# Patient Record
Sex: Male | Born: 1950 | Race: White | Hispanic: No | Marital: Married | Smoking: Former smoker
Health system: Southern US, Community
[De-identification: ages and names within clinical notes are randomized; demographics above are authoritative.]

## PROBLEM LIST (undated history)

## (undated) DIAGNOSIS — I209 Angina pectoris, unspecified: Secondary | ICD-10-CM

## (undated) DIAGNOSIS — F1010 Alcohol abuse, uncomplicated: Secondary | ICD-10-CM

## (undated) DIAGNOSIS — I429 Cardiomyopathy, unspecified: Secondary | ICD-10-CM

## (undated) DIAGNOSIS — I701 Atherosclerosis of renal artery: Secondary | ICD-10-CM

## (undated) DIAGNOSIS — K5792 Diverticulitis of intestine, part unspecified, without perforation or abscess without bleeding: Secondary | ICD-10-CM

## (undated) DIAGNOSIS — I499 Cardiac arrhythmia, unspecified: Secondary | ICD-10-CM

## (undated) DIAGNOSIS — K219 Gastro-esophageal reflux disease without esophagitis: Secondary | ICD-10-CM

## (undated) DIAGNOSIS — I714 Abdominal aortic aneurysm, without rupture, unspecified: Secondary | ICD-10-CM

## (undated) DIAGNOSIS — I509 Heart failure, unspecified: Secondary | ICD-10-CM

## (undated) DIAGNOSIS — I2510 Atherosclerotic heart disease of native coronary artery without angina pectoris: Secondary | ICD-10-CM

## (undated) DIAGNOSIS — J449 Chronic obstructive pulmonary disease, unspecified: Secondary | ICD-10-CM

## (undated) DIAGNOSIS — I739 Peripheral vascular disease, unspecified: Secondary | ICD-10-CM

## (undated) DIAGNOSIS — R0600 Dyspnea, unspecified: Secondary | ICD-10-CM

## (undated) DIAGNOSIS — I10 Essential (primary) hypertension: Secondary | ICD-10-CM

## (undated) DIAGNOSIS — E785 Hyperlipidemia, unspecified: Secondary | ICD-10-CM

## (undated) DIAGNOSIS — I219 Acute myocardial infarction, unspecified: Secondary | ICD-10-CM

## (undated) HISTORY — DX: Diverticulitis of intestine, part unspecified, without perforation or abscess without bleeding: K57.92

## (undated) HISTORY — DX: Gastro-esophageal reflux disease without esophagitis: K21.9

## (undated) HISTORY — PX: CLOSED REDUCTION SHOULDER DISLOCATION: SUR242

## (undated) HISTORY — DX: Hyperlipidemia, unspecified: E78.5

## (undated) HISTORY — DX: Abdominal aortic aneurysm, without rupture, unspecified: I71.40

## (undated) HISTORY — DX: Essential (primary) hypertension: I10

## (undated) HISTORY — DX: Atherosclerotic heart disease of native coronary artery without angina pectoris: I25.10

## (undated) HISTORY — DX: Abdominal aortic aneurysm, without rupture: I71.4

## (undated) HISTORY — PX: CORONARY ARTERY BYPASS GRAFT: SHX141

## (undated) HISTORY — PX: CARDIAC CATHETERIZATION: SHX172

## (undated) HISTORY — DX: Atherosclerosis of renal artery: I70.1

## (undated) HISTORY — PX: RENAL ARTERY STENT: SHX2321

---

## 1995-04-27 HISTORY — PX: ANGIOPLASTY: SHX39

## 2000-09-27 ENCOUNTER — Encounter: Payer: Self-pay | Admitting: *Deleted

## 2000-09-27 ENCOUNTER — Inpatient Hospital Stay (HOSPITAL_COMMUNITY): Admission: EM | Admit: 2000-09-27 | Discharge: 2000-09-29 | Payer: Self-pay | Admitting: Emergency Medicine

## 2000-09-29 ENCOUNTER — Encounter: Payer: Self-pay | Admitting: Cardiology

## 2001-02-18 ENCOUNTER — Encounter: Payer: Self-pay | Admitting: Emergency Medicine

## 2001-02-18 ENCOUNTER — Emergency Department (HOSPITAL_COMMUNITY): Admission: EM | Admit: 2001-02-18 | Discharge: 2001-02-19 | Payer: Self-pay | Admitting: Emergency Medicine

## 2001-10-23 ENCOUNTER — Encounter: Payer: Self-pay | Admitting: *Deleted

## 2001-10-23 ENCOUNTER — Ambulatory Visit (HOSPITAL_COMMUNITY): Admission: RE | Admit: 2001-10-23 | Discharge: 2001-10-23 | Payer: Self-pay | Admitting: *Deleted

## 2002-06-21 ENCOUNTER — Encounter: Payer: Self-pay | Admitting: *Deleted

## 2002-06-21 ENCOUNTER — Inpatient Hospital Stay (HOSPITAL_COMMUNITY): Admission: EM | Admit: 2002-06-21 | Discharge: 2002-07-02 | Payer: Self-pay | Admitting: *Deleted

## 2002-06-21 ENCOUNTER — Encounter: Payer: Self-pay | Admitting: Cardiology

## 2002-06-22 ENCOUNTER — Encounter: Payer: Self-pay | Admitting: Cardiology

## 2002-06-23 ENCOUNTER — Encounter: Payer: Self-pay | Admitting: Pulmonary Disease

## 2002-06-24 ENCOUNTER — Encounter: Payer: Self-pay | Admitting: Cardiology

## 2002-06-25 ENCOUNTER — Encounter: Payer: Self-pay | Admitting: Cardiology

## 2002-06-27 ENCOUNTER — Encounter: Payer: Self-pay | Admitting: Cardiology

## 2002-06-30 ENCOUNTER — Encounter: Payer: Self-pay | Admitting: Cardiology

## 2003-08-15 ENCOUNTER — Inpatient Hospital Stay (HOSPITAL_COMMUNITY): Admission: EM | Admit: 2003-08-15 | Discharge: 2003-08-17 | Payer: Self-pay | Admitting: Emergency Medicine

## 2004-01-11 ENCOUNTER — Inpatient Hospital Stay (HOSPITAL_COMMUNITY): Admission: EM | Admit: 2004-01-11 | Discharge: 2004-01-13 | Payer: Self-pay | Admitting: Emergency Medicine

## 2004-05-18 ENCOUNTER — Ambulatory Visit: Payer: Self-pay | Admitting: Cardiology

## 2004-05-18 ENCOUNTER — Ambulatory Visit: Payer: Self-pay | Admitting: Internal Medicine

## 2004-05-18 ENCOUNTER — Inpatient Hospital Stay (HOSPITAL_COMMUNITY): Admission: EM | Admit: 2004-05-18 | Discharge: 2004-05-25 | Payer: Self-pay | Admitting: *Deleted

## 2004-05-18 DIAGNOSIS — K5730 Diverticulosis of large intestine without perforation or abscess without bleeding: Secondary | ICD-10-CM | POA: Insufficient documentation

## 2005-05-07 ENCOUNTER — Ambulatory Visit: Payer: Self-pay | Admitting: Internal Medicine

## 2005-08-16 ENCOUNTER — Ambulatory Visit: Payer: Self-pay | Admitting: Cardiology

## 2005-08-27 ENCOUNTER — Ambulatory Visit (HOSPITAL_COMMUNITY)
Admission: RE | Admit: 2005-08-27 | Discharge: 2005-08-27 | Payer: Self-pay | Admitting: Physical Medicine and Rehabilitation

## 2005-11-16 ENCOUNTER — Ambulatory Visit: Payer: Self-pay | Admitting: Family Medicine

## 2005-11-23 ENCOUNTER — Ambulatory Visit: Payer: Self-pay | Admitting: Internal Medicine

## 2006-03-10 ENCOUNTER — Encounter: Payer: Self-pay | Admitting: Internal Medicine

## 2006-03-10 ENCOUNTER — Ambulatory Visit: Payer: Self-pay | Admitting: Family Medicine

## 2006-03-10 ENCOUNTER — Emergency Department (HOSPITAL_COMMUNITY): Admission: EM | Admit: 2006-03-10 | Discharge: 2006-03-10 | Payer: Self-pay | Admitting: Emergency Medicine

## 2006-03-10 ENCOUNTER — Ambulatory Visit: Payer: Self-pay | Admitting: Internal Medicine

## 2007-12-13 ENCOUNTER — Ambulatory Visit: Payer: Self-pay | Admitting: Internal Medicine

## 2007-12-13 ENCOUNTER — Observation Stay (HOSPITAL_COMMUNITY): Admission: EM | Admit: 2007-12-13 | Discharge: 2007-12-15 | Payer: Self-pay | Admitting: Emergency Medicine

## 2007-12-14 ENCOUNTER — Encounter: Payer: Self-pay | Admitting: Internal Medicine

## 2007-12-15 ENCOUNTER — Encounter: Payer: Self-pay | Admitting: Internal Medicine

## 2007-12-19 ENCOUNTER — Telehealth: Payer: Self-pay | Admitting: Internal Medicine

## 2007-12-21 ENCOUNTER — Ambulatory Visit: Payer: Self-pay | Admitting: Internal Medicine

## 2007-12-21 DIAGNOSIS — E785 Hyperlipidemia, unspecified: Secondary | ICD-10-CM

## 2007-12-21 DIAGNOSIS — K219 Gastro-esophageal reflux disease without esophagitis: Secondary | ICD-10-CM

## 2007-12-21 DIAGNOSIS — I25119 Atherosclerotic heart disease of native coronary artery with unspecified angina pectoris: Secondary | ICD-10-CM

## 2007-12-21 DIAGNOSIS — R42 Dizziness and giddiness: Secondary | ICD-10-CM

## 2007-12-21 DIAGNOSIS — I10 Essential (primary) hypertension: Secondary | ICD-10-CM

## 2008-11-05 ENCOUNTER — Ambulatory Visit: Payer: Self-pay | Admitting: Family Medicine

## 2008-11-05 DIAGNOSIS — K5792 Diverticulitis of intestine, part unspecified, without perforation or abscess without bleeding: Secondary | ICD-10-CM

## 2008-11-05 DIAGNOSIS — R109 Unspecified abdominal pain: Secondary | ICD-10-CM

## 2008-11-06 ENCOUNTER — Telehealth (INDEPENDENT_AMBULATORY_CARE_PROVIDER_SITE_OTHER): Payer: Self-pay | Admitting: Internal Medicine

## 2008-11-07 ENCOUNTER — Telehealth (INDEPENDENT_AMBULATORY_CARE_PROVIDER_SITE_OTHER): Payer: Self-pay | Admitting: Internal Medicine

## 2008-11-08 ENCOUNTER — Telehealth (INDEPENDENT_AMBULATORY_CARE_PROVIDER_SITE_OTHER): Payer: Self-pay | Admitting: Internal Medicine

## 2009-01-02 ENCOUNTER — Telehealth: Payer: Self-pay | Admitting: Internal Medicine

## 2009-04-17 ENCOUNTER — Ambulatory Visit: Payer: Self-pay | Admitting: Family Medicine

## 2009-04-17 LAB — CONVERTED CEMR LAB
Blood in Urine, dipstick: NEGATIVE
Ketones, urine, test strip: NEGATIVE
Nitrite: NEGATIVE
Urobilinogen, UA: 0.2
WBC Urine, dipstick: NEGATIVE

## 2009-04-18 ENCOUNTER — Encounter: Payer: Self-pay | Admitting: Family Medicine

## 2009-05-18 ENCOUNTER — Emergency Department (HOSPITAL_COMMUNITY): Admission: EM | Admit: 2009-05-18 | Discharge: 2009-05-18 | Payer: Self-pay | Admitting: Emergency Medicine

## 2009-07-21 ENCOUNTER — Ambulatory Visit: Payer: Self-pay | Admitting: Internal Medicine

## 2009-07-21 DIAGNOSIS — M79609 Pain in unspecified limb: Secondary | ICD-10-CM

## 2009-07-23 LAB — CONVERTED CEMR LAB
Albumin: 3.9 g/dL (ref 3.5–5.2)
BUN: 9 mg/dL (ref 6–23)
Calcium: 9.5 mg/dL (ref 8.4–10.5)
Chloride: 102 meq/L (ref 96–112)
Potassium: 4.9 meq/L (ref 3.5–5.1)

## 2010-03-02 ENCOUNTER — Telehealth: Payer: Self-pay | Admitting: Internal Medicine

## 2010-03-09 ENCOUNTER — Telehealth: Payer: Self-pay | Admitting: Internal Medicine

## 2010-05-26 NOTE — Progress Notes (Signed)
Summary: Rx Simvastatin  Phone Note Refill Request Call back at [phone] Message from:  [facility] on March 02, 2010 11:27 AM  Refills Requested: Medication #1:  SIMVASTATIN 40 MG TABS 1 daily Faxed form is in your in box. No recent lab work to check cholesterol.  Initial call taken by: Sydell Axon LPN,  March 02, 2010 11:28 AM  Follow-up for Phone Call        Rx completed in Dr. Tiajuana Amass  Please call him to schedule a physical in the next few months Follow-up by: Cindee Salt MD,  March 02, 2010 1:43 PM  Additional Follow-up for Phone Call Additional follow up Details #1::        Called patient and left message on machine to sch appt. Additional Follow-up by: Mervin Hack CMA Duncan Dull),  March 02, 2010 2:15 PM    New/Updated Medications: SIMVASTATIN 40 MG TABS (SIMVASTATIN) 1 daily Prescriptions: SIMVASTATIN 40 MG TABS (SIMVASTATIN) 1 daily  #30 x 11   Entered and Authorized by:   Cindee Salt MD   Signed by:   Cindee Salt MD on 03/02/2010   Method used:   Electronically to        [facility]* (retail)       [address]       [place], [place]  [address]       Ph: [phone]       Fax: [phone]   RxID:   [id number]

## 2010-05-26 NOTE — Progress Notes (Signed)
Summary: NITROSTAT  Phone Note Refill Request Message from:  Marcheta Grammes [phone] on March 09, 2010 2:24 PM  fax request asking for Nitrostat, per [facility] they have never filled for pt before, ok to refill? see fax in your inbox.   Method Requested: Electronic Initial call taken by: Mervin Hack CMA Duncan Dull),  March 09, 2010 2:25 PM  Follow-up for Phone Call        okay to refill #25 x 1 Follow-up by: Cindee Salt MD,  March 09, 2010 2:34 PM  Additional Follow-up for Phone Call Additional follow up Details #1::        Rx faxed to pharmacy Additional Follow-up by: DeShannon Smith CMA Duncan Dull),  March 09, 2010 3:12 PM    New/Updated Medications: NITROSTAT 0.4 MG SUBL (NITROGLYCERIN) 1 under tongue as needed for chest pain Prescriptions: NITROSTAT 0.4 MG SUBL (NITROGLYCERIN) 1 under tongue as needed for chest pain  #25 x 1   Entered by:   Mervin Hack CMA (AAMA)   Authorized by:   Cindee Salt MD   Signed by:   Mervin Hack CMA (AAMA) on 03/09/2010   Method used:   Electronically to        [facility]* (retail)       [address]       [place], [place]  [address]       Ph: [phone]       Fax: [phone]   RxID:   [id number]

## 2010-05-26 NOTE — Assessment & Plan Note (Signed)
Summary: SWOLLEN R BIG TOE/CLE   Vital Signs:  Patient profile:   60 year old male Weight:      143 pounds Temp:     98.2 degrees F oral Pulse rate:   76 / minute Pulse rhythm:   regular BP sitting:   130 / 70  (left arm) Cuff size:   large  Vitals Entered By: Mervin Hack CMA Duncan Dull) (July 21, 2009 12:06 PM) CC: rt toe swollen   History of Present Illness: Having some swelling and pain along right great toe swelling under 1st MTP as well as down the digit  some pain lateral in foot----hard to flex other toes  no new meds no new supplements  tried soaking in salt water today just started last night  Allergies: 1)  ! * Lopressor  Past History:  Past medical, surgical, family and social histories (including risk factors) reviewed for relevance to current acute and chronic problems.  Past Medical History: Reviewed history from 12/21/2007 and no changes required. Coronary artery disease GERD Hyperlipidemia Hypertension  Past Surgical History: Reviewed history from 11/05/2008 and no changes required. Chilld  Dislocation of right shoulder 1995  CABG 1997 Angioplasty 2002 "Brokenback" 2/04  IWMI with RCA stent 4/05 LAD/LCX stents   EF-51% 9/05 Chest pain--cath okay except IM graft closing 1/06 Diverticulitis--CT of pelvis--difuse sigmoid divertic 8/09  Cath--vein graft occlusions which are old--no acute changes  Family History: Reviewed history from 12/21/2007 and no changes required. Dad died of MI @60  Mom died of MI @79 . Had HTN 1 brother with CAD 1 sister with CAD No DM No prostate or colon cancer  Social History: Reviewed history from 12/21/2007 and no changes required. Occupation: Merchandiser, retail (Therapist, sports) Married---3 sons Current Smoker Alcohol use-yes.  usually 2/day  Review of Systems       no fever no history of gout  Physical Exam  General:  alert.  NAD Neurologic:  slight redness and tenderness along medial right  great toe--by nail some pain along the plantar surface as well pain towards the MTP but not classsically at the MTP no warmth   Impression & Recommendations:  Problem # 1:  TOE PAIN (ICD-729.5) Assessment New  not clear cut could be gout but ??some infection  P: will try keflex and naproxen     check uric acid level    consider colchicine or prednisone if doesn't get better  Orders: TLB-Renal Function Panel (80069-RENAL) Venipuncture (60454) TLB-Uric Acid, Blood (84550-URIC)  Complete Medication List: 1)  Plavix 75 Mg Tabs (Clopidogrel bisulfate) .Marland Kitchen.. 1 tablet by mouth once a day 2)  Carvedilol 3.125 Mg Tabs (Carvedilol) .Marland Kitchen.. 1 tablet two times a day by mouth 3)  Bayer Aspirin 325 Mg Tabs (Aspirin) .Marland Kitchen.. 1 daily by mouth 4)  Prilosec Otc 20 Mg Tbec (Omeprazole magnesium) .Marland Kitchen.. 1 daily by mouth 5)  Tums Smoothies 750 Mg Chew (Calcium carbonate antacid) .... Takes if needed 6)  Simvastatin 40 Mg Tabs (Simvastatin) .Marland Kitchen.. 1 daily 7)  Nitrostat 0.4 Mg Subl (Nitroglycerin) .... As needed 8)  Cephalexin 500 Mg Tabs (Cephalexin) .Marland Kitchen.. 1 by mouth three times a day for infection in toe 9)  Naproxen 500 Mg Tabs (Naproxen) .Marland Kitchen.. 1 tab by mouth two times a day as needed for toe pain  Patient Instructions: 1)  Please call if the pain is not better within 2-3 days 2)  Please keep regular appointment Prescriptions: NAPROXEN 500 MG TABS (NAPROXEN) 1 tab by mouth two times a day as  needed for toe pain  #60 x 0   Entered and Authorized by:   Cindee Salt MD   Signed by:   Cindee Salt MD on 07/21/2009   Method used:   Electronically to        [facility]* (retail)       [address]       [place], [place]  [address]       Ph: [phone]       Fax: [phone]   RxID:   [id number] CEPHALEXIN 500 MG TABS (CEPHALEXIN) 1 by mouth three times a day for infection in toe  #21 x 0   Entered and Authorized by:   Cindee Salt MD   Signed by:   Cindee Salt MD on  07/21/2009   Method used:   Electronically to        [facility]* (retail)       [address]       [place], [place]  [address]       Ph: [phone]       Fax: [phone]   RxID:   [id number]   Current Allergies (reviewed today): ! * Ria Bush

## 2010-07-12 LAB — URINALYSIS, ROUTINE W REFLEX MICROSCOPIC
Ketones, ur: NEGATIVE mg/dL
Nitrite: NEGATIVE
Protein, ur: NEGATIVE mg/dL
pH: 7 (ref 5.0–8.0)

## 2010-07-12 LAB — DIFFERENTIAL
Basophils Absolute: 0.1 10*3/uL (ref 0.0–0.1)
Eosinophils Relative: 3 % (ref 0–5)
Lymphocytes Relative: 23 % (ref 12–46)
Neutro Abs: 6.1 10*3/uL (ref 1.7–7.7)
Neutrophils Relative %: 65 % (ref 43–77)

## 2010-07-12 LAB — COMPREHENSIVE METABOLIC PANEL
AST: 21 U/L (ref 0–37)
BUN: 7 mg/dL (ref 6–23)
CO2: 21 mEq/L (ref 19–32)
Chloride: 104 mEq/L (ref 96–112)
Creatinine, Ser: 0.73 mg/dL (ref 0.4–1.5)
GFR calc non Af Amer: >60 mL/min (ref >60)
Glucose, Bld: 99 mg/dL (ref 70–99)
Total Bilirubin: 1.1 mg/dL (ref 0.3–1.2)

## 2010-07-12 LAB — CBC
HCT: 47.4 % (ref 39.0–52.0)
Hemoglobin: 16.5 g/dL (ref 13.0–17.0)
MCV: 98.3 fL (ref 78.0–100.0)
RBC: 4.82 MIL/uL (ref 4.22–5.81)
WBC: 9.4 10*3/uL (ref 4.0–10.5)

## 2010-08-03 ENCOUNTER — Other Ambulatory Visit: Payer: Self-pay | Admitting: *Deleted

## 2010-08-03 MED ORDER — CARVEDILOL 3.125 MG PO TABS: 3.1250 mg | ORAL_TABLET | Freq: Two times a day (BID) | ORAL | Status: DC

## 2010-08-31 ENCOUNTER — Encounter: Payer: Self-pay | Admitting: Internal Medicine

## 2010-09-02 ENCOUNTER — Ambulatory Visit (INDEPENDENT_AMBULATORY_CARE_PROVIDER_SITE_OTHER): Payer: 59 | Admitting: Internal Medicine

## 2010-09-02 ENCOUNTER — Encounter: Payer: Self-pay | Admitting: Internal Medicine

## 2010-09-02 VITALS — BP 160/90 | HR 63 | Temp 98.6°F | Ht 65.0 in | Wt 147.0 lb

## 2010-09-02 DIAGNOSIS — Z13220 Encounter for screening for lipoid disorders: Secondary | ICD-10-CM

## 2010-09-02 DIAGNOSIS — I10 Essential (primary) hypertension: Secondary | ICD-10-CM

## 2010-09-02 DIAGNOSIS — I2510 Atherosclerotic heart disease of native coronary artery without angina pectoris: Secondary | ICD-10-CM

## 2010-09-02 DIAGNOSIS — K219 Gastro-esophageal reflux disease without esophagitis: Secondary | ICD-10-CM

## 2010-09-02 DIAGNOSIS — E785 Hyperlipidemia, unspecified: Secondary | ICD-10-CM

## 2010-09-02 MED ORDER — SIMVASTATIN 40 MG PO TABS: 40.0000 mg | ORAL_TABLET | Freq: Every day | ORAL | Status: DC

## 2010-09-02 MED ORDER — CARVEDILOL 3.125 MG PO TABS: 3.1250 mg | ORAL_TABLET | Freq: Two times a day (BID) | ORAL | Status: DC

## 2010-09-02 NOTE — Progress Notes (Signed)
Subjective:    Patient ID: [name], male    DOB: 1950-09-14, 60 y.o.   MRN: [id number]  HPI DOing well No new concerns Has $5000 deductible Now working on commission only  No recent heart problems No regular exercise but works hard--climbing ladders, etc No chest pain No SOB or change in exercise tolerance  Did pull a muscle in left chest while changing oil in truck Felt pull when he was using wrench (last week) Initial soreness that then worsened Took a day off 2 days ago, better now No edema Hasn't needed ntg in 5-6 years  No stomach upset No myalgias  On prilosec for 10 years This keeps his stomach quiet  Current outpatient prescriptions:aspirin 325 MG tablet, Take 325 mg by mouth daily.  , Disp: , Rfl: ;  carvedilol (COREG) 3.125 MG tablet, Take 1 tablet (3.125 mg total) by mouth 2 (two) times daily., Disp: 60 tablet, Rfl: 0;  clopidogrel (PLAVIX) 75 MG tablet, Take 75 mg by mouth daily.  , Disp: , Rfl: ;  nitroGLYCERIN (NITROSTAT) 0.4 MG SL tablet, Place 0.4 mg under the tongue every 5 (five) minutes as needed.  , Disp: , Rfl:  omeprazole (PRILOSEC) 20 MG capsule, Take 20 mg by mouth daily.  , Disp: , Rfl: ;  simvastatin (ZOCOR) 40 MG tablet, Take 40 mg by mouth at bedtime.  , Disp: , Rfl: ;  DISCONTD: calcium carbonate (TUMS EX) 750 MG chewable tablet, Chew 1 tablet by mouth daily.  , Disp: , Rfl: ;  DISCONTD: naproxen (NAPROSYN) 500 MG tablet, Take 500 mg by mouth 2 (two) times daily with a meal.  , Disp: , Rfl:   Past Medical History  Diagnosis Date  . CAD (coronary artery disease)   . GERD (gastroesophageal reflux disease)   . Hyperlipidemia   . Hypertension   . Diverticulitis     1/06 Diverticulitis--CT of pelvis--difuse sigmoid divertic    Past Surgical History  Procedure Date  . Coronary artery bypass graft   . Closed reduction shoulder dislocation   . Angioplasty 1997  . Carotid stent     05/2002 IWMI with RCA stent, 4/05 LAD/LCX stents   EF-51%  .  Cardiac catheterization     8/09  Cath--vein graft occlusions which are old--no acute changes    Family History  Problem Relation Age of Onset  . Heart disease Mother   . Hypertension Mother   . Heart disease Father   . Coronary artery disease Sister   . Coronary artery disease Brother   . Diabetes Neg Hx   . Cancer Neg Hx     prostate or colon    History   Social History  . Marital Status: Married    Spouse Name: N/A    Number of Children: 3  . Years of Education: N/A   Occupational History  . Merchandiser, retail( Therapist, sports)    Social History Main Topics  . Smoking status: Current Everyday Smoker  . Smokeless tobacco: Never Used  . Alcohol Use: 7.2 oz/week    12 Cans of beer per week  . Drug Use: Not on file  . Sexually Active: Not on file   Other Topics Concern  . Not on file   Social History Narrative  . No narrative on file   Review of Systems Weight is stable Generally sleeps okay No urinary problems Bowels are fine    Objective:   Physical Exam  Constitutional: He appears well-developed and well-nourished. No  distress.  Neck: Normal range of motion. Neck supple. No thyromegaly present.  Cardiovascular: Normal rate, regular rhythm, normal heart sounds and intact distal pulses.  Exam reveals no gallop.   No murmur heard. Pulmonary/Chest: Effort normal and breath sounds normal. No respiratory distress. He has no wheezes. He has no rales.  Abdominal: Soft. There is no tenderness.  Musculoskeletal: Normal range of motion. He exhibits no edema and no tenderness.  Lymphadenopathy:    He has no cervical adenopathy.  Psychiatric: He has a normal mood and affect. His behavior is normal. Judgment and thought content normal.          Assessment & Plan:

## 2010-09-03 LAB — CBC WITH DIFFERENTIAL/PLATELET
Basophils Absolute: 0 10*3/uL (ref 0.0–0.1)
Lymphocytes Relative: 29.2 % (ref 12.0–46.0)
Monocytes Relative: 9.5 % (ref 3.0–12.0)
Platelets: 268 10*3/uL (ref 150.0–400.0)
RDW: 14.7 % — ABNORMAL HIGH (ref 11.5–14.6)

## 2010-09-03 LAB — HEPATIC FUNCTION PANEL
AST: 22 U/L (ref 0–37)
Albumin: 4 g/dL (ref 3.5–5.2)
Alkaline Phosphatase: 44 U/L (ref 39–117)
Total Protein: 6.8 g/dL (ref 6.0–8.3)

## 2010-09-03 LAB — BASIC METABOLIC PANEL
BUN: 9 mg/dL (ref 6–23)
Chloride: 102 mEq/L (ref 96–112)
Glucose, Bld: 99 mg/dL (ref 70–99)
Potassium: 4.4 mEq/L (ref 3.5–5.1)

## 2010-09-03 LAB — LDL CHOLESTEROL, DIRECT: Direct LDL: 147.2 mg/dL

## 2010-09-03 LAB — LIPID PANEL: HDL: 46.7 mg/dL (ref >39.00)

## 2010-09-08 NOTE — Discharge Summary (Signed)
[name], [name]              ACCOUNT NO.:  [id number]   MEDICAL RECORD NO.:  [id number]          PATIENT TYPE:  INP   LOCATION:  2006                         FACILITY:  [facility]   PHYSICIAN:  Everardo Beals. Juanda Chance, MD, FACCDATE OF BIRTH:  June 03, 1950   DATE OF ADMISSION:  12/13/2007  DATE OF DISCHARGE:  12/15/2007                               DISCHARGE SUMMARY   PROCEDURES:  1. Cardiac catheterization.  2. Coronary arteriogram.  3. Left ventriculogram.  4. Left internal mammary artery arteriogram.  5. Vein graft angiogram.   PRIMARY FINAL DISCHARGE DIAGNOSIS:  Chest pain, medical therapy for  coronary artery disease.   SECONDARY DIAGNOSES:  1. Status post aortocoronary bypass surgery in 1995 with left internal      mammary artery to left anterior descending, saphenous vein graft to      ramus intermedius, saphenous vein graft to posterior descending      artery and __________.  2. Status post inferior ST-elevation myocardial infarction in 2004      with bare metal stent to the saphenous vein graft to posterior      descending artery.  3. Status post percutaneous intervention with drug-eluting stents to      the left anterior descending and circumflex in 2005.  4. Hypertension.  5. Hyperlipidemia.  6. History of diverticulitis.  7. Gastroesophageal reflux disease.  8. History of esophagitis.  9. Ongoing tobacco use.  10.Allergy or intolerance to [facility].  11.Family history of coronary artery disease.   TIME AT DISCHARGE:  39 minutes.   HOSPITAL COURSE:  [name] is a 60 year old male with known coronary  artery disease.  He had chest pain and took nitroglycerin.  When it did  not resolve, EMS was called and he was transported to the hospital where  he was admitted for further evaluation and treatment.   Cardiac catheterization shows severe native 3-vessel disease with the  SVG and ramus intermedius and SVG to PDA and OM totaled as in prior  studies.  The LIMA to LAD  had a 50% ostial lesion and the circumflex had  no in-stent restenosis.  The LAD had no proximal in-stent restenosis,  but after septal perforator it was totaled as well.  Dr. Juanda Chance reviewed  the films and felt that the vein graft occlusions were old and unchanged  from a prior study.  The distal RCA would be a source of ischemia, but  this is also unchanged.  A D-dimer was ordered.   [name] was seen by Smoking Cessation.  He had a VPAP placed to his  right groin for oozing, which resolved.  His orthostatic vital signs  were mildly positive and he was hydrated.  On December 15, 2007, Mr.  [name] was still complaining of dizziness and headache.  A neuro  consult was called and he was seen by Dr. Sharene Skeans who felt that he had  nonspecific headache and dizziness with no focal neurologic deficits.  MRI and MRA are low-yield procedures unless there are focal neurologic  findings or deficits.  They also felt that the headache could be  secondary to nitrates  which were changed from the IV to p.o. form in the  hopes that at a lower dose they would be better tolerated.  Dr. Juanda Chance  felt that no further inpatient workup for his headache or dizziness  needed.  He could be safely discharged home with close outpatient  followup.  [name] is stable at discharge.   DISCHARGE INSTRUCTIONS:  His activity levels to be increased gradually.  He is not to do any driving for 5 days.  He just is to stick to a low-  fat diet.  He is to follow up with Dr. Clifton James in September.  He is to  follow up Dr. Alphonsus Sias as needed.   DISCHARGE MEDICATIONS:  1. Plavix 75 mg daily.  2. Aspirin 325 mg a day.  3. Coreg 12.5 mg is discontinued.  4. Coreg 3.125 mg b.i.d.  5. Prilosec 20 mg daily.  6. Imdur 30 mg daily.  7. Sublingual nitroglycerin p.r.n.  8. Crestor 40 mg daily.      Theodore Demark, PA-C      Bruce R. Juanda Chance, MD, [facility]  Electronically Signed    RB/MEDQ  D:  12/15/2007  T:  12/16/2007   Job:  [id number]   cc:   Karie Schwalbe, MD

## 2010-09-08 NOTE — Consult Note (Signed)
[name], [name]              ACCOUNT NO.:  [id number]   MEDICAL RECORD NO.:  [id number]          PATIENT TYPE:  INP   LOCATION:                               FACILITY:  [facility]   PHYSICIAN:  Deanna Artis. Hickling, M.D.DATE OF BIRTH:  1951-02-12   DATE OF CONSULTATION:  12/13/2007  DATE OF DISCHARGE:  12/15/2007                                 CONSULTATION   CHIEF COMPLAINT:  Lightheaded and headaches.   HISTORY OF THE PRESENT CONDITION:  Mr. Judice is a 60 year old  gentleman with coronary artery disease with 9 stents coronary artery  bypass graft x4 in 1995 and multiple catheterizations.  The patient was  admitted with substernal chest pain that has resolved.   The patient had cardiac catheterization which failed to show significant  stenosis.  Plans were made to discharge him from the hospital; however,  he complained of headaches and dizziness, and we were asked to see him  to determine the etiology of his dysfunction, make recommendations for  further workup and treatment.   It turns out that the patient has had periods of not feeling right.  This is not a true lightheadedness or vertigo through the mild amount of  disequilibrium that he experiences when he initially stands that it  straightens out very quickly.  When the patient initially wakes up in  the morning, he feels well.  When he has these symptoms, he then feels  that as if he is in a mental fog.  It is hard for him to concentrate,  and he often will lay down, taken a nap, and then he feels better.  The  patient has not had loss of contact of his world.  He has had no periods  of unresponsive staring.  He has had no convulsive behaviors.   In addition, he has had mild dull pressure in the frontal regions of his  head that he says is like a sinusitis.  It is not common for him to have  a severe headache, although he had one last night simultaneous with an  episode of slight increase in his blood pressure which was  treated with  nitroglycerin drip that actually exacerbated his symptoms.   Past medical history is remarkable for hypertension, dyslipidemia, and  prior myocardial infarction.  The patient has not had problems with  migraines.  He also has coronary artery disease, gastroesophageal reflux  disease, and history of esophagitis.   His current medications include Plavix 75 mg daily, aspirin 325 mg  daily, Coreg 3.125 mg daily, and Prilosec 20 mg daily.   DRUG ALLERGIES:  LOPRESSOR.   REVIEW OF SYSTEMS:  Positive for dizziness and lightheadedness although  what he really means by this is that he feels like he is not mentally  sharp.  Complains of hypertension and angina.  Twelve-system review is  otherwise negative.  The patient has not had intercurrent infections of  head, neck. lungs, GI, GU, fevers, chills, sweats, rash, or other organ  system complaints.   FAMILY HISTORY:  Mother died of myocardial infarction at age 54.  Father  died of  myocardial infarction at age 41.  The patient has a brother with  hypertension and a sister whose health history is unknown.   SOCIAL HISTORY:  The patient is married.  He has of 75 pack-years of  smoking and smokes one cigarette per week.  His wife smokes one pack of  cigarettes per day.  He is tried to quit, but has difficulty doing so.  He has about two beers per day.  He does not use drugs.  He is a  Brewing technologist.  He lives in [place].   PHYSICAL EXAMINATION:  VITAL SIGNS:  Today, blood pressure is 102/74,  resting pulse 70, respirations 16, temperature 97.8, and oxygen  saturation 96% on room.  EAR, NOSE, AND THROAT:  No infections.  No bruits.  LUNGS:  Clear.  HEART:  No murmurs.  Pulses normal.  ABDOMEN:  Soft.  Bowel sounds normal.  No hepatosplenomegaly.  EXTREMITIES:  Unremarkable.  NEUROLOGIC:  The patient is awake and alert without dysphasia or  dyspraxia.  Cranial nerves:  Round and reactive pupils.  Visual  fields  full to double simultaneous stimuli.  Fundi normal.  Symmetric facial  strength, midline tongue and uvula.  Air conduction greater than bone  conduction bilaterally.  MOTOR:  Normal strength, tone, and mass.  Good fine motor movements.  No  pronator drift.  Sensation withdrawal x4.  He is intact to cold,  vibration, and stereoagnosis.  Cerebellar examination:  Good finger-to-  nose, rapid alternating movements.  Gait normal.  Normal heel-to-toe and  tandem.  Negative Romberg.  Deep tendon reflexes are diminished.  The  patient had bilateral flexor plantar responses.   IMPRESSION:  Headache disorder 74.0, dizziness 780.4.  These are  nonspecific findings.  He has no focal neurologic deficits.   RECOMMENDATIONS:  Follow conservatively for now.  MRI and MRA are low-  yield procedures.  I expect that we will find atherosclerotic  cerebrovascular disease in small vessels.  I expect that we will find  some atherosclerotic disease of his intracranial vessels.  I do not  think that we will find evidence of acute stroke, tumor, hydrocephalus,  or other acute medical problems that should be causing focal neurologic  deficits in a patient who has none.   I spoke with the family for nearly half hour describing the situation,  and at the end the family decided against aggressive workup at this  time.  I have asked them to contact me if he develops any focal  neurologic deficits and of course we it would immediately image his head  to study him.   The medications that he takes are appropriate for his heart as well as  his brain.  I see no reason at this time carrying out an EEG because I  do not think that the telling of his sensorium has anything to do with  epilepsy.  I appreciate the opportunity to participate in his care.  If  you have questions or if I could be of assistance, do not hesitate to  contact me.       Deanna Artis. Sharene Skeans, M.D.  Electronically Signed     [facility]/MEDQ   D:  12/15/2007  T:  12/16/2007  Job:  [id number]   cc:   Everardo Beals. Juanda Chance, MD, [facility]

## 2010-09-08 NOTE — H&P (Signed)
[name], [name] NO.:  [id number]   MEDICAL RECORD NO.:  [id number]          PATIENT TYPE:  INP   LOCATION:  2006                         FACILITY:  [facility]   PHYSICIAN:  [name] Salvia, MD, FACCDATE OF BIRTH:  May 11, 1950   DATE OF ADMISSION:  12/13/2007  DATE OF DISCHARGE:                              HISTORY & PHYSICAL   PRIMARY CARDIOLOGIST:  Previously [name] Farber, MD   PRIMARY CARE [name]:  [name] Schwalbe, MD   PATIENT PROFILE:  A 60 year old Caucasian male with prior history of in-  stent coronary artery disease, status post CABG and multiple PCIs post  caths who presents with recurrent angina problems.  1. Unstable angina/CAD.      a.     Status post CABG x4 in 1995 with LIMA to the LAD, vein graft       to the ramus intermedius, and sequential vein graft to the PDA and       acute marginal.      b.     On June 21, 2002, inferior ST-elevation MI with cath and       PCI.  Cath revealed total occlusion of vein graft to the PDA and       this was stented with 4.5 and 5.0 x 28-mm Ultra bare-metal stents.       The patient had recurrent pain and re-look at catheterization       revealed reocclusion of the vein graft to the PDA.  This was again       stented with 4.5 x 18 mm Express II, 4.5 x 15 mm Ultra, and 3.0 x       24 mm Express bare-metal stents.      c.     On August 14, 2003, catheterization revealing occluded vein       graft to the PDA/acute marginal and vein graft to the ramus       intermedius with patent LIMA.  The LAD was stented with a 2.5 x 8       mm Taxus drug-eluting stent.  Left circumflex stented with a 2.5 x       12 mm Taxus drug-eluting stent.      d.     On January 13, 2004, cardiac catheterization, patent LAD       and circumflex stents.  Patent LIMA to the LAD.  2. Hypertension.  3. Hyperlipidemia.  4. History of diverticulitis.  5. GERD.  6. History of esophagitis.  7. Tobacco abuse, previously with a  75-pack-year history then quitting      in 1995 and subsequently resumed smoking about 1-pack a month.   HISTORY OF PRESENT ILLNESS:  A 60 year old married Caucasian male with a  history of CAD status post CABG and multiple PCIs with his last  catheterization in September 2005 revealing multivessel disease, 4 out  of 4 patent grafts, patent LAD, and circumflex stents.  He has been  medically managed and has not had outpatient cardiology followup since  then.  Overall, he has done well until this past Monday, December 11, 2007, when he noted a significant lightheadedness that has been constant  and worse with position changes, i.e., moving from a stooped or seated  to a standing position.  This did not significantly limit his  activities.  This morning after playing catch with his grandchildren, he  went inside and sat in front of his computer to read e-mail.  He then  developed sudden onset of 5/10 substernal chest pressure with hot  feeling similar to previous angina.  Denied any dyspnea or nausea.  Took sublingual nitroglycerin without relief of chest discomfort,  although also without any progression of discomfort.  He told his wife  and then called their son, who is a Company secretary, and his squad came out to  the house.  The patient was given 3 sublingual nitroglycerin and  morphine and became pain free approximately 45 minutes after onset of  symptoms.  He was taken to the [facility] ED, and ECG has shown no acute  changes, although he is bradycardic with rates in the high 40s.  He did  have one episode of recurrent chest pain while here in the ED and was  given fentanyl and nitro with reduction of pain.   ALLERGIES:  LOPRESSOR.   HOME MEDICATIONS:  1. Plavix 75 mg daily.  2. Aspirin 325 mg daily.  3. Coreg 3.125 mg b.i.d.  4. Prilosec 20 mg daily.   FAMILY HISTORY:  Mother died of an MI at 57.  Father died of an MI at  age 43.  He has a brother who has hypertension and a sister, he  is not  sure about her health history.   SOCIAL HISTORY:  He lives in [place] with his wife.  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  He has about 10-  pack-year history of tobacco abuse initially, quitting in 1995; however,  he seems to resumed smoking 1-pack a month.  He drinks 2 beers a day.  He denies any drug use.  He does not routinely exercise but does  __________ in the house.   REVIEW OF SYSTEMS:  Positive for chest pain with warm sensation as  outlined in the HPI.  Otherwise, all systems review is negative.   PHYSICAL EXAMINATION:  VITAL SIGNS:  Temperature is 97.8, heart rate 54,  respirations 18, blood pressure 113/72, and pulse ox 98% 2 liters.  GENERAL:  Pleasant white male in no acute stress.  Awake, alert and  oriented x3.  HEENT:  Normal.  Nares grossly intact, nonfocal.  SKIN:  Warm and dry without lesions or masses.  NECK:  No bruits or JVD.  LUNGS:  Respirations regular and unlabored.  Clear to auscultation.  CARDIAC:  Regular bradycardic, S1and S3.  No S3, S4, or murmurs.  ABDOMEN:  Round, soft, nontender, and nondistended.  Bowel sounds  present x4.  EXTREMITIES:  Warm, dry, and pink.  No clubbing, cyanosis, or edema.  Dorsalis pedis and posterior tibial pulses 2+ and equal bilaterally.   Chest x-ray shows no active disease.  EKG shows sinus bradycardia, rate  of 48 beats per minute, normal axes, and poor R-wave progression.  Hemoglobin 15.3, hematocrit 45.2 WBCs 7.1, platelets 273, sodium 136,  potassium 4.3, chloride 108, CO2 25, BUN 8, creatinine 0.7, and glucose  111.   ASSESSMENT AND PLAN:  1. Unstable angina.  The patient presents with symptoms concerning for      angina.  Symptoms are similar of previous angina.  He is now  pain      free.  His cardiac enzymes are pending.  ECG is without acute      changes.  Plan to admit for cycle cardiac markers.  Add heparin,      nitrate, aspirin, Plavix, and beta-blocker as  heart rate and blood      pressure allow.  Add statin.  Plan cath in the a.m.  2. Hypertension, stable.  3. Hyperlipidemia.  Check lipids and LFTs, add statin.  4. Tobacco abuse.  Smoking cessation advised.  We will obtain smoking      cessation consult.  5. Lightheadedness.  Check orthostatics and gently hydrate.  6. Gastroesophageal reflux disease.  Continue PPI.      Nicolasa Ducking, ANP      [name] Salvia, MD, [facility]  Electronically Signed    CB/MEDQ  D:  12/13/2007  T:  12/14/2007  Job:  [id number]

## 2010-09-11 NOTE — Cardiovascular Report (Signed)
NAMEGILBERT, [name]                          ACCOUNT NO.:  [id number]   MEDICAL RECORD NO.:  [id number]                   PATIENT TYPE:  INP   LOCATION:  1827                                 FACILITY:  [facility]   PHYSICIAN:  Salvadore Farber, M.D. [facility]         DATE OF BIRTH:  Oct 05, 1950   DATE OF PROCEDURE:  06/21/2002  DATE OF DISCHARGE:                              CARDIAC CATHETERIZATION   PROCEDURES:  Left heart catheterization, left ventriculography, coronary  angiography, saphenous vein graft angiography x2, selective left subclavian  angiography, selective left internal mammary artery angiography. Stent to  saphenous vein graft to right coronary artery using pronto thrombectomy and  filter wire distal protection.   INDICATIONS:  The patient is a 60 year old gentleman, status post coronary  artery bypass grafting in 1995.  He has previously undergone stenting of his  circumflex.  He now presents with acute inferior myocardial infarction of  approximately two hours duration.  He was hemodynamically stable.  Due to  ongoing pain and ST elevations he is brought to the catheterization lab for  urgent catheterization with an eye to revascularization.   PROCEDURAL TECHNIQUE:  Informed consent was obtained.  In the emergency  room, the patient had been administered IV heparin and 600 mg of Plavix.  Upon arrival to the catheterization lab he was administered double bolus  Integrilin.  Using the modified Seldinger technique, a 6 French sheath was  placed in the right femoral vein and 7 French sheath in the right femoral  artery.  Diagnostic angiography was performed using JL4 and JR4 catheters to  selective engage the native coronaries, a JR4 catheter to selectively engage  the saphenous vein graft to the ramus intermedius, JR4 catheter to  selectively engage the left subclavian, LIMA catheter to selectively engage  the left internal mammary, and a JR4 catheter to selectively engage  the vein  graft to the RCA.  These demonstrated occlusion of the saphenous vein graft  to the RCA.   ACT was checked and additional heparin given to achieve an ACT greater than  200 seconds. There was complete occlusion of the saphenous vein graft after  the anastomosis to the acute marginal.  Flow into the acute marginal was  TIMI-2.  A 7 French RCB guide was advanced over a wire and engaged in the  ostium of the vein graft to the RCA.  A luge wire was advanced around the  lesion but could not be manipulated into the distal vasculature.  Therefore  this was removed and a PT Graphix wire was manipulated into the distal RCA.  Thrombectomy using the pronto catheter was performed multiple times yielding  a very large amount of thrombotic and atheroembolic debris.  Unfortunately,  the pronto catheter became locked on the wire necessitating removal.  The  lesion was re-crossed without difficulty. After thrombectomy, TIMI-1 flow  was reestablished to the distal vessel.  Multiple doses of intracoronary  adenosine  were given prior to and after initial thrombectomy.  Several more  passes with the thrombectomy catheter were performed eventually establishing  TIMI-2 flow.  With resolution of flow, the distal vessel could now be seen  and there was a position distal to the lesion for seating a embolic  protection device. Therefore, a filter wire device was deployed in the  distal vein graft, just proximal to the distal anastomosis.  Apposition was  confirmed in two views.  There was some hint of impaired apposition on the  superior margin, but it was felt this could not be improved by  repositioning.  Further arthrectomy was then performed with distal  protection device. In places there was mobile thrombosis visible within the  vessel.  Stenting was then performed.  A 5.0 x 28 mm Ultra was positioned in  the distal portion of the lesion and deployed at 14 atmospheres.  A 4.5 x 28  mm Ultra was then  positioned proximally such as to overlap the proximal  portion of the previously placed stent.  This was deployed at 16  atmospheres.  The region of overlap of the two stents was then post-dilated  using a 5.0 x 20 mm Quantum at 16 atmospheres.  The filter was then  retrieved.  Angiograms demonstrated the stents to be well opposed.  There  was TIMI-3 flow to the distal vasculature. There remained substantial  residual thrombosis at the site of the distal anastomosis comprising  approximately 40% residual stenosis.  However, there was a very severe size  mismatch between the vein graft and the native vessel at the size of the  anastomosis (2.5 to 5 mm).  In addition, the small size of the native  vessels would preclude distal protection for any further angioplasty or  stenting of this distal thrombosis.  Therefore, a decision was made to treat  this thrombosis medically since the patient was pain-free and had TIMI-3  flow to the distal vessel. He will therefore be continued on Integrilin for  48 hours.  The catheters were removed. The sheaths will be removed in the  CCU.  The patient tolerated the procedure well.   COMPLICATIONS:  None.   DIAGNOSTIC FINDINGS:  1. LV:  146/7/15, EF 50% with posterobasal and mid inferior akinesis.  2. No mitral regurgitation or aortic stenosis.  3. Patent left subclavian artery.  4. Left main:  There is a focal, shelflike lesion of the mid vessel which is     unchanged from June 2003.  5. LAD:  The LAD is a large vessel reaching the apex of the heart and giving     rise to three diagonal vessels.  The LAD is diffusely diseased proximally     and occluded after the takeoff of a small first diagonal branch.  The     distal LAD is supplied via a widely patent left internal mammary artery     graft.  The LAD is a small caliber vessel without significant focal stenosis. 1. Ramus intermedius:  The ramus intermedius is occluded at its ostium. It     is  collateralized from the graphic portion of the LAD. The saphenous vein     graft to the ramus is occluded (no change compared with June 2003).  2. Circumflex:  The circumflex is a moderate sized vessel giving rise to a     single obtuse marginal branch. There is approximately 40% in-stent re-     stenosis in the proximal portion of the  previously placed circumflex     stent. In addition, there is a 40% stenosis in the obtuse marginal distal     to the stent.  3. RCA:  The RCA is occluded at its ostium.  The saphenous vein graft to the     RCA is patent to the level of the first anastomosis with the acute     marginal.  However, there is TIMI-2 flow into this vessel.  The vein     graft is then heavily burdened with thrombosis distally resulting in     occlusion in no flow.   IMPRESSION/RECOMMENDATIONS:  Successful stenting of the sequential saphenous  vein graft to acute marginal and distal right coronary artery restoring TIMI-  3 flow.  The stented segment has no residual stenosis.  There remains a  nonobstructive thrombosis distal to the stented segment at the distal  anastomosis.  We will plan on continuing aspirin and Plavix indefinitely.  Integrilin will be continued for 48 hours in an attempt to resolve this  residual thrombosis.  Beta blocker and ACE inhibitor will be continued as  will statin.  The patient will be cared for in the cardiac intensive care  unit.                                                 Salvadore Farber, M.D. [facility]    WED/MEDQ  D:  06/21/2002  T:  06/21/2002  Job:  [id number]

## 2010-09-11 NOTE — Discharge Summary (Signed)
[place]  Patient:    [name], [name]                       MRN: [id number] Adm. Date:  [id number] Disc. Date: 09/29/00 Attending:  Daisey Must Dictator:   Abelino Derrick, P.A.C. [facility] CC:         Daisey Must, M.D. [facility]   Referring Physician Discharge [place]  DISCHARGE DIAGNOSES: 1. Chest pain.  Moderate progression of coronary disease by catheterization    this admission with negative Cardiolite. 2. Coronary artery bypass grafting x 4 in 1995 in [place], [place], with two    of four grafts occluded this admission and a 70% narrowing of the saphenous    vein graft to posterior descending artery. 3. Gastroesophageal reflux. 4. Treated hyperlipidemia.  HISTORY OF PRESENT ILLNESS:  The patient is a 60 year old male who had bypass surgery in [place], [place], in 1995.  He had an LIMA to the LAD, SVG to the ramus intermedius, and SVG to the PDA and acute marginal.  HOSPITAL COURSE:  He was admitted for chest pain on September 27, 2000, with EKG changes, although we had no old EKGs to evaluate and compare.  CK-MBs and troponins were negative.  He was set up for diagnostic catheterization, which was done on September 28, 2000, by [employer]. Juanda Chance, M.D.  This revealed a total SVG to the intermediate.  The SVG to the acute marginal and PDA had a 70% hypodense distal narrowing.  The LIMA to the LAD was patent.  There was a previous stent site in the circumflex that was patent.  The EF was 55%.  Bruce Elvera [place] Juanda Chance, M.D., felt that the SVG to the acute marginal and PDA was diffusely diseased and not amenable to PCI.  The plan was for medical therapy.  The patient did have a Cardiolite study prior to discharge that showed no ischemia with an EF of 57%.  DISCHARGE MEDICATIONS: 1. Celexa 20 mg a day. 2. Prilosec 20 mg a day. 3. Atenolol 25 mg a day. 4. Lipitor 80 mg at h.s. 5. Altace 10 mg a day. 6. Coated aspirin q.d. 7. Nitroglycerin sublingual  p.r.n.  LABORATORY DATA:  Sodium 140, potassium 3.6, BUN 7, creatinine 1.2.  CK-MBs and troponins were negative.  The lipid profile showed an LDL of 126 and HDL 35.  INR 0.9.  The chest x-ray showed no active disease.  The EKG showed sinus rhythm and sinus bradycardia with anterior T-wave inversion.  DISPOSITION:  The patient is discharged in stable condition and will follow up with Dr. Lenard Simmer on October 10, 2000, at 12 p.m. DD:  09/29/00 TD:  09/29/00 Job: [id number]

## 2010-09-11 NOTE — Discharge Summary (Signed)
NAMEVARICK, KEYS                          ACCOUNT NO.:  [id number]   MEDICAL RECORD NO.:  [id number]                   PATIENT TYPE:  INP   LOCATION:  2038                                 FACILITY:  [facility]   PHYSICIAN:  Carole Binning, M.D. [facility]         DATE OF BIRTH:  05/07/1950   DATE OF ADMISSION:  01/11/2004  DATE OF DISCHARGE:  01/13/2004                           DISCHARGE SUMMARY - REFERRING   DISCHARGE DIAGNOSES:  1.  Native three-vessel coronary artery disease.  2.  Ejection fraction of 45%.  3.  Hypertension, treated.  4.  Gastroesophageal reflux disease, treated.  5.  Dyslipidemia, treated.  6.  History of Staphylococcus aureus bacteremia/cardiogenic shock with      __________  in March 2004, in the setting of an inferior wall myocardial      infarction.  7.  METOPROLOL ALLERGY (hives).  8.  Family history of coronary artery disease.   HISTORY OF PRESENT ILLNESS:  The patient is a 60 year old male patient who  was admitted on January 11, 2004, with substernal chest pain.  Cardiac  markers were negative x3.  Total cholesterol was 138, triglycerides 103, LDL  78, HDL 39.  Renal function and CBC were essentially normal.   HOSPITAL COURSE:  The patient underwent a cardiac catheterization on  January 13, 2004, and was found to have three-vessel coronary artery  disease with patent stents of the LAD and circumflex.  The patient has had  bypass surgery, and at the ostium of the LIMA there is some moderate disease  of 60%.  There is chronic disease of the saphenous vein graft to the PDA and  saphenous vein graft to the diagonal.  At this point Dr. Emilie Rutter. Pulsipher  felt that we should obtain an outpatient Cardiolite to rule out apical  ischemia, secondary to the LIMA graft.  At this point medical therapy.   DISCHARGE MEDICATIONS:  1.  We will start Protonix 40 mg daily.  2.  In addition, we will give Altace 2.5 mg daily (due to his decreased      ejection fraction  of 45%).  We will stop his Prilosec.  3.  We will continue his other medications which include aspirin 325 mg      daily.  4.  Lipitor 80 mg q.h.s.  5.  Plavix 75 mg daily.  6.  __________  3.125 mg daily.  7.  Zetia 10 mg daily.  8.  Sublingual nitroglycerin p.r.n. chest pain.   ACTIVITY:  __________  activity until after follow-up appointment.   FOLLOWUP:  1.  The patient has a follow-up appointment with Dr. Gerri Spore on February 06, 2004, at 11:30 a.m.  2.  The Cardiolite is scheduled on January 17, 2004, at 9 a.m.   DISCHARGE INSTRUCTIONS:  The patient is to call with any questions or  concerns.   WOUND CARE:  To clean the cardiac catheterization site with  soap and water.  No scrubbing.       LB/MEDQ  D:  01/13/2004  T:  01/13/2004  Job:  [id number]   cc:   Carole Binning, M.D. [facility]

## 2010-09-11 NOTE — Cardiovascular Report (Signed)
[place]. [facility]  Patient:    KHAIDYN, STAEBELL Visit Number: [id number] MRN: [id number]          Service Type: CAT Location: [facility] 2860 01 Attending Physician:  Daisey Must Dictated by:   Daisey Must, M.D. [facility] [facility] Proc. Date: 10/23/01 Admit Date:  10/23/2001 Discharge Date: 10/23/2001   CC:         Lorita Officer, M.D., [place], [place]  Cardiac Catheterization Laboratory   Cardiac Catheterization  PROCEDURES PERFORMED: Left heart catheterization with coronary angiography and left ventriculography and bypass graft angiography.  INDICATIONS: The patient is a 60 year old male who underwent prior coronary bypass surgery. He was seen in the office recently with somewhat atypical chest pain. A stress Cardiolite scan was performed and interpreted as revealing mild inferior wall ischemia, which was changed from a previous Cardiolite. We therefore opted to proceed with cardiac catheterization.  DESCRIPTION OF PROCEDURE: A 6 French sheath was placed in the right femoral artery. Native coronary arteries were injected with Standard Judkins 6 French catheters. The vein graft to the ramus intermedius was injected with a JR4 catheter. The internal mammary artery was injected with an internal mammary catheter. The vein graft to the right coronary artery was injected with an RCB catheter. Left ventriculogram was performed with an angled pigtail catheter. Contrast was Omnipaque. There were complications.  RESULTS:  HEMODYNAMICS: Left ventricular pressure 102/10, aortic pressure 112/64.  There was no aortic valve gradient.  LEFT VENTRICULOGRAM: There is mild hypokinesis of the apex. Otherwise, wall motion is normal. Ejection fraction calculated at 59%. There is trace mitral regurgitation.  CORONARY ARTERIOGRAPHY: (Right dominant).  Left main has a mid 40% stenosis with a "shelflike" plaque.  Left anterior descending artery had a tubular 60% stenosis in the  proximal vessel followed by a 100% occlusion of the mid vessel just after a septal perforator. The proximal LAD gives rise to a small diagonal branch, which has an 80% stenosis at its origin. The distal LAD fills via left internal mammary graft.  Left circumflex has a stent extending from the proximal vessel into the proximal portion of OM-1. There is a 50% stenosis within this previously placed stent. Beyond the stent in the midportion of OM-1, which is a normal sized vessel, there is a 50% stenosis. The circumflex also gives rise to a branching ramus intermediate, which was 100% occluded at its origin. The ramus intermediate fills via collaterals from the left anterior descending artery.  Right coronary artery is a dominant vessel. It is 100% occluded at its origin. The distal right coronary fills via saphenous vein graft  Left internal mammary artery to the distal LAD is patent throughout its course. This fills the distal LAD as well as retrograde to the mid LAD including two diagonal branches.  Saphenous vein graft to the ramus intermediate is 100% occluded at its origin. This is unchanged from previous catheterization.  There is a sequential saphenous vein graft to the acute marginal and posterior descending artery arising from the right coronary artery. In the distal portion of this graft, there is a 60-70% stenosis followed by a long area of haziness with the appearance of possible layered thrombus versus layering of contrast. There is somewhat slow flow in the graft beyond this. This graft fills a normal sized acute marginal branch and a small posterior descending artery, as well as two small posterolateral branches arising from the distal right coronary artery. In comparison of previous catheterization done in June of 2000, the appearance of  this graft is not changed significantly.  IMPRESSIONS: 1. Preserved left ventricular systolic function. 2. Native three-vessel  coronary artery disease. 3. Status post coronary artery bypass surgery with patent grafts to the    distal left anterior descending and to the right coronary artery system    as described. There is an occluded graft to ramus intermediate, which was    seen on previous catheterization. There is moderate disease in the distal    portion of the vein graft to the acute marginal and posterior descending    artery as described. However, this is unchanged from previous    catheterization. There is also moderate disease in the native left main    and left circumflex. However, this is not significantly changed from    previous catheterization.  PLAN: The patient will be continued on medical therapy. Dictated by:   Daisey Must, M.D. [facility] Attending Physician:  Daisey Must DD:  10/23/01 TD:  10/24/01 Job: [id number]

## 2010-09-11 NOTE — Discharge Summary (Signed)
NAMEDORION, PETILLO              ACCOUNT NO.:  [id number]   MEDICAL RECORD NO.:  [id number]          PATIENT TYPE:  INP   LOCATION:  5738                         FACILITY:  [facility]   PHYSICIAN:  Angelia Mould. Derrell Lolling, M.D.DATE OF BIRTH:  10-31-1950   DATE OF ADMISSION:  05/18/2004  DATE OF DISCHARGE:  05/25/2004                                 DISCHARGE SUMMARY   DISCHARGE DIAGNOSES:  1.  Diverticulitis.  2.  Coronary artery disease, status post multiple stent placements, as well      as coronary artery bypass grafting.  3.  Hyperlipidemia, treated.  4.  Hypertension, treated.   ALLERGIES:  LOPRESSOR.   HOSPITAL COURSE:  Mr. Zeidman is a 60 year old male patient with a history  of left lower quadrant pain that worsened on May 18, 2004, and presented  to the emergency room.  He was found to have acute diverticulitis and placed  in the hospital on IV Cipro and Flagyl.  He continued to have discomfort,  and follow-up CT showed minimal improvement.  IV Unasyn was added.   By May 25, 2004, the patient was essentially-pain and felt to be ready  for discharge to home.   LABORATORY DATA:  BMP was essentially normal.  Hemoglobin 14.1, hematocrit  41.2, white count 8.6.  Leukocytosis was not demonstrated.  C. difficile was  negative.  CT of the abdomen and pelvis showed edema associated with the  left and sigmoid colon.  This was improved on the May 21, 2004, scan.   During the patient's hospitalization, he did undergo cardiac evaluation  because of the bradycardia and the fact that he was on Coreg.  He was  brought off the Coreg, and his heart rate remained in the 50s, and the Coreg  was restarted without further problems.   DISCHARGE MEDICATIONS:  1.  Protonix 40 mg daily.  2.  Lipitor 80 mg daily.  3.  Coreg 3.125 mg twice daily.  4.  Altace 5 mg daily.  5.  Plavix 75 mg daily.  6.  Zetia 10 mg daily.  7.  Enteric-coated aspirin 325 mg daily.  8.  We have asked him to  continue Cipro 500 mg b.i.d. and Flagyl 500 mg      t.i.d. for one more week.  9.  Vicodin 1-2 tablets q.6h. as needed for pain.   ACTIVITY:  As tolerated.   DIET:  We have given him a diverticulitis/diverticulosis diet.   FOLLOWUP:  He is to follow up in the office in two weeks with Dr. Wenda Low.      LB/MEDQ  D:  05/25/2004  T:  05/25/2004  Job:  [id number]
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
## 2010-09-11 NOTE — Discharge Summary (Signed)
NAMEJANARI, [name]                          ACCOUNT NO.:  [id number]   MEDICAL RECORD NO.:  [id number]                   PATIENT TYPE:  INP   LOCATION:  3715                                 FACILITY:  [facility]   PHYSICIAN:  [name], M.D. [facility]         DATE OF BIRTH:  [birth year]   DATE OF ADMISSION:  06/21/2002  DATE OF DISCHARGE:  07/02/2002                           DISCHARGE SUMMARY - REFERRING   DISCHARGE DIAGNOSES:  1. Acute inferior myocardial infarction, status post percutaneous coronary     angiography/stents.  2. Two saphenous vein grafts to the right coronary artery graft on June 21, 2002.  3. Known coronary artery disease history with history of coronary artery     bypass graft surgery.  4. Hypertension.  5. Gastroesophageal reflux disease.  6. Hyperlipidemia.  7. Allergy to LOPRESSOR.  8. Cardiogenic shock, resolved.  9. Ventricular fibrillation/ventricular tachycardia, resolved.  10.      Ventilator-dependent respiratory failure after cardiopulmonary     arrest, when extubated.  11.      Fever with sputum, abundant Staphylococcus aureus, treated with     antibiotics.  12.      Anemia with heme-positive stools, with stable hemoglobins.   HISTORY OF PRESENT ILLNESS:  The patient is a 60 year old male patient with  a known history of coronary artery disease prior to his date of admission on  June 21, 2002.  He has a history of bypass surgery approximately nine  years prior to his date of admission.  At around 2 a.m. on the morning of  admission, he awoke with neck and jaw pain associated with his chest  discomfort.   HOSPITAL COURSE:  In the emergency room the electrocardiogram revealed an  acute inferior myocardial infarction.  He was taken urgently to the cardiac  catheterization laboratory.  The patient was found to have an occlusion to  his saphenous vein graft to the AM-RCA, and Multi-Link Ultra stents were  placed to this area by Dr.  Salvadore Klein.  A Pronta AngioJet  thrombectomy was performed twice.  The patient tolerated the procedure well  and was taken back to the critical care unit in stable condition.   Later that evening the patient developed right upper quadrant discomfort.  In a long discussion that Dr. Veneda Klein had with the patient and with his  wife, there was concern over reocclusion; however, he did not feel that it  would be beneficial to reintervene in this patient by going back to the  cardiac catheterization laboratory.  The patient agreed.   We tried to treat the patient medically; however, at 8 p.m. on June 21, 2002, the patient developed a ventricular fibrillation arrest and  cardiogenic shock.  The patient received CPR, inotropes, direct  cardioversion, intubation, and the placement of an intra-aortic balloon  pump, and a temporary RV pacer.  A central line was placed.  He then  took  the patient back to the laboratory and he was found to have an occluded  saphenous vein graft to the right coronary artery.  Three stents were  deployed, as well as an AngioJet thrombectomy.   Due to the patient's arrest, he did remain on the vent for sometime, and  this was managed by Critical Care.  The patient did spike fevers while on  the vent and was noted to have abundant Staphylococcus aureus in the  culture.  He was treated with aggressive IV antibiotic therapy.  He was  eventually extubated on June 27, 2002.  Blood cultures and urine cultures  were essentially negative during this time period.   Due to his inferior infarction with RV involvement and the subsequent  cardiac arrest, the patient had a slight progression during his  hospitalization.  He was also noted to have heme-positive stool during a  consultation, but his hematocrit remained stable.  This was felt to be  multifactorial, secondary to his treatment with anti-platelet agents.  He  will need to follow up with GI in the future.   His hemoglobin on discharge  was noted to be 10.9 with a hematocrit of 32.8, platelets 295.   A 2-D echocardiogram was performed during the patient's hospitalization, to  evaluate his left ventricle.  The left atrial size was at the upper limits  of normal at 33 mm.  The RV was mildly dilated with RV systolic function  markedly reduced.  The LV revealed moderate dysfunction, although it was a  poor study to gauge an exact ejection fraction.  It may be of some value to  repeat the cardiac catheterization after some recovery of his left  ventricle.   The patient eventually did grow a pathogen from his urine culture.  He had a  20,000 colony count of Staphylococcus species similar to that of his  respiratory culture.   LABORATORY DATA:  Studies during this patient's hospital stay included a  sodium 137, potassium 3.8, BUN 9, creatinine 0.7.  Hemoccult positive.  The  maximum CK 2123 with 116 MB fractions, __________ 7.73.   A final chest x-ray, revealed an acute improvement of the edema.   A final electrocardiogram noted Q-waves in the inferior leads, a normal  sinus rhythm, rate of 89.   DISPOSITION:  Eventually the patient was discharged to home on July 02, 2002.   CONDITION ON DISCHARGE:  He was discharged to home in stable, improved  condition.   DISCHARGE MEDICATIONS:  1. To resume his Prilosec.  2. Atenolol 25 mg daily.  3. Plavix 75 mg daily.  4. Niacin 500 mg daily.  5. __________  80 mg daily.  6. Enteric-coated aspirin 325 mg daily.  7. Sublingual nitroglycerin p.r.n. chest pain.  8. He may use Tylenol p.r.n. pain.   DISCHARGE INSTRUCTIONS:  At this point, he is not to resume his Altace,  secondary to low blood pressures in the hospital.  Hopefully, this can be  added back at a later date.  May utilize Tylenol as needed for pain.   ACTIVITY:  No strenuous activity.   WOUND CARE:  He may clean the catheterization site with soap and water.    FOLLOWUP:  Follow  up with Dr. Samule Klein on July 24, 2002, at 2 p.m.  Prior to  this visit, he should have an echocardiogram on July 13, 2002, at 10 a.m.         [name], P.A. [facility]  [name], M.D. [facility]    LB/MEDQ  D:  07/24/2002  T:  07/24/2002  Job:  [id number]   cc:   [name], M.D. [facility]   Lorita Officer, M.D.  Phone #[phone]

## 2010-09-11 NOTE — Discharge Summary (Signed)
NAMETIMONTHY, HOVATER NO.:  [id number]   MEDICAL RECORD NO.:  [id number]          PATIENT TYPE:  EMS   LOCATION:  MAJO                         FACILITY:  [facility]   PHYSICIAN:  Pricilla Riffle, MD, FACCDATE OF BIRTH:  Oct 17, 1950   DATE OF ADMISSION:  03/10/2006  DATE OF DISCHARGE:  03/10/2006                               DISCHARGE SUMMARY   PRIMARY CARDIOLOGIST:  Salvadore Farber, MD.   PRIMARY CARE PHYSICIAN:  Kerby Nora, MD.   HISTORY OF PRESENT ILLNESS:  This is a 59 year old Caucasian male with  known coronary artery disease, patient normally followed by Dr. Randa Evens, who presented to the emergency room with complaints of  substernal chest pressure which was constant beginning this a.m.  Actually, the patient started having left-sided chest pain and pressure  approximately four days ago with associated vomiting.  The pain was  constant and remained with the patient until Tuesday afternoon.  The  patient did go to work but had some nausea and left early, did not go to  work on Wednesday because of associated nausea and left-sided pressure.  The following morning, this a.m., the patient's pain had gone away, he  was on his way to work when he began to feel a little lightheaded and  some substernal chest discomfort on the left side.  Secondary to his  history of coronary artery disease, the patient presented to his primary  care physician, Dr. Ermalene Searing, for further evaluation.   The patient states that he knows the difference between his cardiac pain  and his GERD pain.  He states that on the left side of his chest he does  feel pressure, usually to the axillary area, with GERD-type pain, it is  constant, and he burps a lot.  The patient states that this a.m. he felt  the same type of pressure, only it was substernal, he thought it was his  heart, and that is why he presented to Dr. Daphine Deutscher office.  On  evaluation by Dr. Ermalene Searing, the patient was sent to  the emergency room  for further evaluation secondary to this chest pain which had been  constant since that a.m.  On arrival to the emergency room, the  patient's blood pressure was 129/85, heart rate 52, respirations 19,  temperature 97.7.  The patient was given 2 mg of morphine intravenously  and started on a nitroglycerin drip.  The chest discomfort lessened  some; however, it remained.  He described this as a 2/10.  He also  complained of discomfort with taking deep breaths.   REVIEW OF SYSTEMS:  Positive for chest pressure, mild, on the left side,  mild shortness of breath with pain on inspiration and positive for  lightheadedness and positive for nausea and vomiting three days prior to  ER visit.  All other systems reviewed and negative.   PAST MEDICAL HISTORY:  1. Coronary artery disease status post multiple stent placements along      with coronary artery bypass grafting; most recent cardiac      catheterization on January 13, 2004, revealing mildly decreased  left ventricular systolic function secondary to prior inferior wall      myocardial infarction, native three-vessel coronary artery disease      status post coronary artery bypass surgery.  There are chronic      occlusions of the vein grafts to the posterior descending artery      and bronchus intermedius.  This is unchanged from previous      catheterization.  There is moderate disease in the ostium of the      left internal mammary graft which is of borderline severity.  There      are patent stents in the proximal left anterior descending artery      and proximal left circumflex.  There is disease in the left main      coronary artery of borderline significance.  This was shown not to      be significant on intravascular ultrasound on previous      catheterization.  The patient was to be continued on medical      treatment.  B:  Status post Cardiolite stress test September 23      without evidence of ischemia in  the left anterior descending      distribution.  There was evidence of inferior scar with a question      of some irreversibility in the inferior wall.  Of note, during the      stress test, he was noted to have premature ventricular      contractions and episodes of ventricular bigeminy.   Other past medical history to include:  GERD, hypercholesterolemia,  hypertension, and diverticulitis.   CURRENT MEDICATIONS:  1. Enteric-coated aspirin 325 mg one p.o. daily.  2. Plavix 75 mg p.o. daily.  3. Prilosec 20 mg p.o. b.i.d.  4. Lipitor 80 mg p.o. daily.  5. Zetia 10 mg p.o. daily  6. Coreg 6.25 mg b.i.d.   ALLERGIES:  ALLERGIES TO LOPRESSOR, causing rash and itching.   PAST SURGICAL HISTORY:  Coronary artery bypass grafting as stated above.   SOCIAL HISTORY:  The patient lives in [place] with his wife.  He is a  Merchandiser, retail and travels a lot; most recent trip to [place].  He has  three children.  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  He stopped  in 1995.  He drinks 2-3 beers a day.  He is on a low-cholesterol diet.  He does not take regular exercise.  No illicit drug use or herbal  medicine use.   FAMILY HISTORY:  Mother died at age 37 with an MI.  Father died at age  73 with an MI.  One brother with CAD and angina.  One sister, uncertain  of health status.   PHYSICAL EXAM:  GENERAL:  He is awake, alert, oriented with complaints  of subscapular/pleuritic type chest pain with deep inspiration.  HEENT:  Patient wears glasses.  Eyes:  PERRLA.  Mucous membranes in the  mouth pink and moist.  Tongue is midline.  NECK:  Supple without JVD or carotid bruits appreciated.  CARDIOVASCULAR:  Distant heart sounds, regular rate and rhythm without  murmurs, rubs, or gallops.  He is bradycardic.  Pulses are equal  bilaterally.  Dorsalis pedis pulses and posterior tibial pulses along  with radial pulses are 1+. LUNGS:  Essentially clear to auscultation, but, with deep  inspiration,  he does have coarse breath sounds and mild wheezing with forced  expiration.  ABDOMEN:  Soft, nontender, with hyperactive bowel sounds.  It is supple  with no hepatomegaly noted.  EXTREMITIES:  Without clubbing, cyanosis, or edema.  SKIN:  Warm and dry.  NEURO:  Essentially intact.   LABS:  A troponin 0.05, CK 55, MB 1.3.  Hemoglobin 14.9, hematocrit  43,7, white blood cells 7.2, platelets 281.  Sodium 138, potassium 4.4,  chloride 105, CO2 of 26, BUN 8, creatinine 0.8, glucose 95.  Total bili  45, AST 23, ALT 24, amylase 51, lipase 25, total protein 6.2, albumin  3.7, D-dimer negative at 0.2.   ASSESSMENT:  1. Reactive airway disease with pleuritic chest pain.  2. Known coronary artery disease with coronary artery bypass grafting,      three-vessel, and stents to the left anterior descending and      circumflex.  3. Chronic back pain.  4. Hypertension.  5. Hypercholesterolemia  6. A history of gastroesophageal reflux disease.   PLAN:  The patient appears to have more pleuritic chest pain.  On  examination, the patient did have some expiratory wheezes and rhonchi  with forced expirations.  The patient was given respiratory treatment  using albuterol and Atrovent.  He did have pre- and post-peak flows  completed with the respiratory treatment.  Pre-peak flow was found to be  450 (range yellow).  After respiratory treatment, he had improvement  with a peak flow of greater than 500 (range in the green).  We will plan  followup with a pulmonologist, Dr. Jayme Cloud, in two weeks.  The patient  has been given a prescription for Advair inhaler 50/500 two puffs  b.i.d..  The patient will also follow up with his primary care  physician, Dr. Ermalene Searing, in one week for further evaluation of his  breathing status.  The patient is also to follow up with Dr. Samule Ohm on  December 20 at 4 p.m. for cardiac followup.  This has been explained to  the patient and verbalizes understanding.   He knows to call us sooner if  things change or pain reoccurs.      Bettey Mare. Lyman Bishop, NP      Pricilla Riffle, MD, [facility]  Electronically Signed    KML/MEDQ  D:  03/10/2006  T:  03/11/2006  Job:  [id number]

## 2010-09-11 NOTE — Cardiovascular Report (Signed)
[name], [name]                          ACCOUNT NO.:  [id number]   MEDICAL RECORD NO.:  [id number]                   PATIENT TYPE:  INP   LOCATION:  2930                                 FACILITY:  [facility]   PHYSICIAN:  Veneda Melter, M.D. [facility]               DATE OF BIRTH:  [birth year]   DATE OF PROCEDURE:  06/21/2002  DATE OF DISCHARGE:                              CARDIAC CATHETERIZATION   PROCEDURES PERFORMED:  1. AngioJet atherectomy, saphenous vein graft to the right coronary artery.  2. Percutaneous transluminal angioplasty and stent placement x3, saphenous     vein graft to the right coronary artery.   DIAGNOSES:  1. Native three-vessel coronary artery disease.  2. Atherosclerosis, saphenous vein bypass graft.  3. Status post inferior wall myocardial infarction with achieved re-closure.   HISTORY:  The patient is a 60 year old white male with a history of coronary  artery disease who has undergone coronary artery bypass graft surgery  approximately 9 years ago.  He has known occlusion of the vein graft to the  intermediate artery.  In the early morning hours of June 20, 2002, he  presented with acute inferior wall myocardial infarction, was found to have  occlusion of the vein graft to the PDA. He underwent percutaneous  intervention with placement of two stents to the mid section of the  saphenous vein graft to the PDA.  Angiography at that time showed  significant atheroma and possible vessel disruption distal to the stent.  However, there was improvement in flow and it was felt that the patient  should be stabilized medically.  Unfortunately, he had recurrence of chest  discomfort and ECG changes consistent with reocclusion of the vessel. He is  brought emergently to the catheterization lab.   TECHNIQUE:  Informed consent was obtained, patient brought to the cardiac  catheterization lab, a 7 French sheath was placed in the left femoral  artery.  A 7 French RCB  guide catheter was then used to engage the vein  graft to the PDA and selective angiography performed.  This showed the vein  graft to be occluded in its mid section in the area of previous stent  placement.   We thus elected to proceed with percutaneous intervention.  A 0.014 inch PT  Graphix wire was advanced into the vessel, however, it could not be advanced  completely through the stented segment due to acute bend in the vessel.  Using a transient catheter for support, the wire was carefully advanced into  the distal PDA.  The transient catheter was positioned in the PDA and manual  injections of contrast confirmed presence of the wire in the true lumen of  the vessel.  There was flow in the distal vein graft, PDA and posterior  ventricular branches, although severe residual disease of 70% was noted  prior to the anastomosis.  In addition, and within the proximal segment of  the stented region there appeared to be some vessel disruption and  occlusion.  The wire was repositioned and the transcatheter removed. The  patient had been previously treated with Integrilin, this was continued.  He  was also given heparin to maintain an ACT of greater than 250 seconds.  The  AngioJet atherectomy catheter was introduced and a total of four passes made  at 16 seconds each within the distal section of the vessel.  Repeat  angiography showed recanalization of the vessel and improved flow with  significant residual disease within the previously placed stent as well as  distal to the stent prior to the anastomosis. A 4.0 x 15 mm Maverick balloon  was introduced and used to dilate the distal vein graft prior to the  anastomosis at 6 atmospheres for 60 seconds, and within the proximal stent  at 12 atmospheres for 60 seconds.  Repeat angiography showed again improved  blood flow.  However, there was a residual defect within the proximal stent  of unclear etiology.  We elected to cover this with a 4.5 x  15 mm Ultra  stent deployed at 12 atmospheres for 45 seconds. In the distal vessel, a 3.0  x 24 mm Express II stent was deployed at 12 atmospheres for 30 seconds with  the proximal segment extending into the previously placed stent and post-  dilated with a 4.0 x 15 mm Maverick at 12 and 15 atmospheres for 30 seconds  each in the mid and proximal segments of the stent respectively.  Repeat  angiography showed an excellent result with minimal residual stenosis within  the previously stented segment and only mild residual disease of 20% in the  distal section that had been newly stented.  Attention was then turned to  the proximal segment of the saphenous vein graft where an atheroma of 70%  and possible dissection was present.  A 4.5 x 16 mm Express II stent was  primarily deployed at 12 atmospheres for 45 seconds.  Repeat angiography was  then performed after intracoronary nitroglycerin showing no residual  stenosis in the proximal mid vessel only mild residual disease of 20% in the  distal vessel and improved flow of at least TIMI-2 plus in the distal branch  vessel, due to the significant size mismatch between the vein grafts and the  distal vessel. A column of blood and contrast was noted within the vein  graft with slow egress.  No significant myocardial blush was noted. By the  termination of the case, the patient had complete resolution of chest  discomfort. This was deemed acceptable result.  The guide catheter was  removed and the sheath secured in position.  The patient was transferred to  CCU in stable condition.   FINAL RESULTS:  1. Successful primary stent placement in the proximal segment of the     saphenous vein graft to right coronary artery with reduction of 70%     narrowing to 0% with placement of 4.5 x 18 mm Express II stent.  2. Successful percutaneous transluminal coronary angioplasty and stent    placement to the mid section of the saphenous vein graft with  reduction     of 100% thrombotic occlusion to 0% with placement of a 4.5 x 15 Ultra     stent.  3. Successful percutaneous transluminal coronary angioplasty and stent     placed in the distal section of the saphenous vein graft with reduction     of 70-80% atherothrombotic disease to  less than 20% with placement of a     3.0 x 24 mm Express II stent dilated to 4 mm in the proximal segment.   ASSESSMENT AND PLAN:  The patient is a 60 year old gentleman with a 9-year-  old saphenous vein graft that is severely diseased providing blood flow to a  small target vessel.  The patient has high risk for reocclusion.  He will  continue on heparin and Integrilin for at least 72 hours and should be  continued on Plavix indefinitely.  Aggressive medical therapy will be pursued should the patient have  reocclusion of this vessel, repeat intervention is unlikely to be of  significant benefit.                                                  Veneda Melter, M.D. [facility]    NG/MEDQ  D:  06/21/2002  T:  06/22/2002  Job:  [id number]   cc:   Lorita Officer, M.D.   Doylene Canning. Ladona Ridgel, M.D. [facility]   Salvadore Farber, M.D. [facility]

## 2010-09-11 NOTE — H&P (Signed)
[name], [name] NO.:  [id number]   MEDICAL RECORD NO.:  [id number]                   PATIENT TYPE:  INP   LOCATION:  4737                                 FACILITY:  [facility]   PHYSICIAN:  Pricilla Riffle, M.D.                 DATE OF BIRTH:  1950/07/16   DATE OF ADMISSION:  08/15/2003  DATE OF DISCHARGE:                                HISTORY & PHYSICAL   IDENTIFICATION:  [name] is a 60 year old gentleman with a history of  coronary artery disease.  He is followed by Dr. Salvadore Farber here in  clinic.  He underwent coronary artery bypass graft in 1995.  The patient  presents today for increasing chest pain.   HISTORY OF PRESENT ILLNESS:  The patient's cardiac history again dates back  to the 1990's.  He had CABG done, LIMA to LAD, SVG to RAMUS, SVG to RCA.  The patient has had several cardiac catheterizations.  He had stent to his  circumflex done at some point.  Note the most recent cardiac catheterization  was back in February of 2004.  This showed an LAD that was occluded after  the first diagonal.  The ramus was occluded at its ostium.  It was filled  via collaterals from the distal LAD, SVG to RAMUS was occluded.  The  circumflex was a moderate sized vessel that had a 40% in-stent restenosis in  the proximal portion, 40% OM lesion.  RCA was occluded at its ostium.  Saphenous vein graft to the RCA was patent at the first anastomosis, but  then thrombosed distally.  This could not be recanalized.   The patient was last seen in July in Clinic.   On talking to the patient, he was doing okay until Monday.  He developed  chest pain on and off.  He describes it as a left chest pain below the  breast, occurring on and off. He did not take anything for this.  It was  associated with some dizziness.  Tuesday, it became worse.  He thought about  calling 911 but did not.  Yesterday, he arrived back home from his trip  driving.  He went to bed  and slept about 15 hours.  Today, he woke up  feeling okay but then developed chest pressure later in the morning.  He was  seen by Dr. _________ and referred her for further evaluation.   The patient notes he is just feeling exhausted, some lightheadedness.  He  says prior to his last angiogram, he had substernal chest pressure so this  is different.  He has had this chest pain in the past, on the left side, and  he says the workup has been negative, but he did not have the associated  fatigue as he has now.   ALLERGIES:  LOPRESSOR causes rash.   CURRENT MEDICATIONS:  1. Prilosec 20 b.i.d.  2. Atenolol 25 daily.  3. Plavix 75 daily.  4. Lipitor 80 mg daily.  5. Baby aspirin 81 mg daily.  6. Iso-Bid 30 mg daily.  7. Niaspan 1500 mg daily.  8. Zetia 10 mg daily.  9. The patient did not take nitroglycerin because of lightheadedness.   PAST MEDICAL HISTORY:  1. Coronary artery disease as noted above.  2. Dyslipidemia.  3. GE reflux.  4. History of remote tobacco abuse.   SOCIAL HISTORY:  The patient quit tobacco in 1995.  He is married.   FAMILY HISTORY:  Father died of myocardial infarction at age 60, mother with  coronary artery disease, one brother with a history of angina.   REVIEW OF SYSTEMS:  The patient denies fevers and chills.  No productive  cough.  Denies that this is similar to reflux symptoms.   PHYSICAL EXAMINATION:  GENERAL:  The patient is in minimal distress.  VITAL SIGNS:  Blood pressure is 125/77, pulse is 57 and regular, weight 150  pounds.  NECK:  JVP is normal, no bruits.  LUNGS:  Clear.  CARDIAC EXAM:  Regular rate and rhythm, S1 and S2, no S3, S4.  No murmurs.  ABDOMEN:  Exam is benign.  No hepatosplenomegaly.  CHEST:  Nontender T deep palpation.  EXTREMITIES:  Good distal pulses.  No lower extremity edema.   STUDIES:  A 12-lead EKG shows normal sinus bradycardia at a rate of 55 beats  per minute.  T wave inversion in lead #3 (old).  Occasional  PVC's.   IMPRESSION:  1. [name] is a 60 year old gentleman with known coronary artery     disease.  He has an occlusion to the vein graft to the right.  He comes     in today with chest pain that he has had in the past that has not been     cardiac by his report, but accompanied by marked fatigue,     lightheadedness.  It is concerning if this is cardiac, and I think he     should be admitted and ruled out, and undergo cardiac catheterization to     define his anatomy again.  Again, there has been no other warning signs     that this is noncardiac.  2. I spoke with the patient.  He understands.  He is concerned because of     the marked fatigability, and the severity of this.  We will go ahead and     plan admission today with catheterization tomorrow.   ADDENDUM:  The patient will continue on his other medicines with gentle use  of morphine, and begin Lovenox and Integrilin.                                                Pricilla Riffle, M.D.    PVR/MEDQ  D:  08/15/2003  T:  08/16/2003  Job:  [id number]

## 2010-09-11 NOTE — Cardiovascular Report (Signed)
[name], [name]                          ACCOUNT NO.:  [id number]   MEDICAL RECORD NO.:  [id number]                   PATIENT TYPE:  INP   LOCATION:  2038                                 FACILITY:  [facility]   PHYSICIAN:  Carole Binning, M.D. [facility]         DATE OF BIRTH:  1950/07/19   DATE OF PROCEDURE:  01/13/2004  DATE OF DISCHARGE:                              CARDIAC CATHETERIZATION   PROCEDURE PERFORMED:  Left heart catheterization with coronary angiography  and left ventriculography, and bypass graft angiography.   INDICATION:  Mr. Broner is a 60 year old male with history of complex  coronary artery disease status post coronary artery bypass graft surgery and  status post multiple previous percutaneous coronary interventions.  He  presented to the hospital two days ago with symptoms of prolonged substernal  chest pain lasting for days.  He ruled out for myocardial infarction.  Because of his known history of coronary artery disease, he was referred for  cardiac catheterization.   PROCEDURAL NOTE:  A 6 French sheath was placed in the right femoral artery.  Native coronary angiography was performed with standard Judkins 6 French  catheters.  The saphenous vein grafts were imaged with a JR-4 catheter.  The  internal mammary artery was imaged with an internal mammary catheter.  Left  ventriculography was performed with an angled pigtail catheter.  Contrast  was Omnipaque.  There were no complications.   RESULTS:   HEMODYNAMICS:  1.  Left ventricular pressure 108/14.  2.  Aortic pressure 114/70.  3.  There is no aortic valve gradient.   LEFT VENTRICULOGRAM:  There is moderate akinesis of the inferior basal wall.  Ejection fraction calculated at 45%.  There is trace mitral regurgitation.   CORONARY ARTERIOGRAPHY (RIGHT DOMINANT):  Left main has complex disease with  a 50% stenosis at its ostium and a shelf-like 50% stenosis in the mid body.   Left anterior descending  artery has a stent in the proximal vessel which is  widely patent with 0% stenosis within the stent.  The LAD is 100% occluded  in the mid vessel just after two large septal perforators.  The LAD gives  rise to a small first diagonal branch proximal to its occlusion.  There is a  90% stenosis in the ostium of this diagonal branch.  This is unchanged from  previous catheterization.  The distal LAD fills via left internal mammary  graft.   Left circumflex has a stent in the proximal vessel.  This stent is widely  patent with 0% stenosis within the stent.  Just proximal to the stent there  is a 30% stenosis.  The circumflex gives rise to a large obtuse marginal  branch which has 30% stenosis proximally.  There is also a ramus intermedius  which is 100% occluded and fills via left-to-left collaterals.   Right coronary artery is 100% occluded at its ostium.  The distal right  coronary artery  fills via grade 1 to 2 left-to-right collaterals.   Left internal mammary artery to the distal left anterior descending is  patent.  However, there is a 50-60% stenosis in the ostium of the left  internal mammary artery.  This fills the mid and distal LAD.  In comparison  to previous films, there may have been slight progression in the disease in  the ostium and the left internal mammary artery graft.   Saphenous vein graft to the posterior descending artery is 100% occluded  proximally.  This is unchanged from previous catheterization.   Saphenous vein graft to the ramus intermedius is 100% occluded at its  origin.  This is unchanged from previous catheterizations.   IMPRESSION:  1.  Mildly decreased left ventricular systolic function secondary to prior      inferior wall myocardial infarction.  2.  Native three-vessel coronary artery disease.  3.  Status post coronary artery bypass surgery.  There are chronic      occlusions of the vein grafts to the posterior descending artery and the      ramus  intermedius.  This is unchanged from previous catheterization.      There is moderate disease in the ostium of the internal mammary graft      which is of borderline severity.  There are patent stents in the      proximal left anterior descending artery and proximal left circumflex.      There is disease in the left main coronary artery of borderline      significance.  This was shown to be not significant on intravascular      ultrasound on previous catheterization.   PLAN:  At this point, we will treat the patient medically.  We will proceed  with an outpatient stress Cardiolite to rule out ischemia in the apical  wall.  If there is ischemia in the distal LAD distribution, then we could  consider percutaneous coronary intervention in the ostium of the internal  mammary graft.      MWP/MEDQ  D:  01/13/2004  T:  01/13/2004  Job:  [id number]   cc:   Dr. [name]

## 2010-09-11 NOTE — Discharge Summary (Signed)
NAMEDOUGLASS, DUNSHEE                          ACCOUNT NO.:  [id number]   MEDICAL RECORD NO.:  [id number]                   PATIENT TYPE:  INP   LOCATION:  6525                                 FACILITY:  [facility]   PHYSICIAN:  Pricilla Riffle, M.D.                 DATE OF BIRTH:  06-19-1950   DATE OF ADMISSION:  08/15/2003  DATE OF DISCHARGE:  08/17/2003                           DISCHARGE SUMMARY - REFERRING   PROCEDURE:  Coronary angiography/stenting (Taxus) left anterior descending  and circumflex artery August 16, 2003.   REASON FOR ADMISSION:  Please refer to dictated admission note.   LABORATORY DATA:  Normal serial cardiac markers.  D-dimer is 0.41, normal  electrolytes and renal function.  Normal CBC.   Admission chest x-ray shows no active disease.   HOSPITAL COURSE:  Following admission for evaluation of symptoms worrisome  for unstable angina pectoris in the setting of known coronary artery  disease, status post previous remote bypass surgery and subsequent inferior  MI February 2004.  The patient ruled out for myocardial infarction with all  serial cardiac markers within normal limits.  A d-dimer was also checked  which was negative.   The patient was placed on nitroglycerin and heparin with plans to proceed  with cardiac catheterization.  Of note, he also complained of some  lightheadedness which was felt to be secondary to bradycardia.  Beta blocker  was subsequently stopped.  Telemetry noted sinus bradycardia in a 40 BPM  range with occasional PVCs but no SVT.   Cardiac catheterization performed by Dr. Emilie Rutter Pulsipher on August 16, 2003  (see the record for full details) notable for 99% proximal LAD and a 75%  proximal circumflex lesion.  These were both successfully dilated with a  Taxus stent, both deserved some residual stenosis.  There were no noted  complications.   Regarding the bypass grafts, there was wide patency of the LIMA/LAD graft  with no significant  distal disease.  Total occlusion of the SVG/ramus  intermedius and SVG/PDA graft.  The SVG/RCA occlusion was chronic on a  previous study in February of 2004.   LV function was mildly decreased (51%).  Of note, Dr. Emilie Rutter. Pulsipher also  analyzed the LMCA with IVUS noting its 40-50% lesion and he opted to not  intervene.   The patient will need to be maintained on Plavix for 12 months.  The patient  was kept for overnight observation and cleared for discharge the following  morning in hemodynamically stable condition.  There were no noted  complications over the incision sites.   DISCHARGE MEDICATIONS:  1. Coreg 3.125 mg b.i.d. (new).  2. Plavix 75 mg q.d. (x 1 year).  3. Coated aspirin 325 mg q.d.  4. Prilosec 20 mg b.i.d.  5. Lipitor 80 mg q.h.s.  6. Niaspan 50 mg q.d.  7. Zetia 10 mg q.d.  8. Imdur 30 mg q.d.  9. Nitrostat 0.4 mg p.r.n.   DISCHARGE INSTRUCTIONS:  1. Stop Atenolol.  2. Refrain from any heavy lifting for two days.  Refrain from driving until     seen by Dr. Emilie Rutter. Pulsipher.  3. Maintain low fat/cholesterol diet.  4. Call the office if there is any swelling or bleeding in the groin.  5. The patient is instructed to follow up with Dr. Emilie Rutter. Pulsipher in two     weeks.  Arrangements will be made through the office.   DISCHARGE DIAGNOSES:  1. Coronary artery disease progression.     a. Status post stenting (Taxus) proximal left anterior descending artery        and proximal circumflex August 16, 2003.     b. Residual 40-50% left middle cerebral artery disease, treated        medically.     c. Widely patent left internal mammary artery/left anterior descending        artery graft.  Otherwise occluded graft.     d. Mild left ventricular dysfunction (ejection fraction of 51%).     e. Status post coronary artery bypass graft in 1995.     f. Status post inferior myocardial infarction in February 2004 with 100%        saphenous vein graft/right coronary artery  graft.  2. Bradycardia.  3. Hypertension.  4. Dyslipidemia.  5. Gastroesophageal reflux disease.      Gene Serpe, P.A. [facility]                      Pricilla Riffle, M.D.    GS/MEDQ  D:  08/17/2003  T:  08/18/2003  Job:  [id number]   cc:   Sharyon Medicus, M.D.

## 2010-09-11 NOTE — Discharge Summary (Signed)
[place]  Patient:    ARLAND, USERY                       MRN: [id number] Adm. Date:  [id number] Attending:  Daisey Must Dictator:   Abelino Derrick, P.A.C. [facility]                  Referring Physician Discharge [place]  ADDENDUM:  Dr. Gerri Spore has decided to add Plavix 75 mg a day to Mr. Hamiltons current medications.  This was called in to the [facility] at [facility].  I spoke with Mr. Gulla wife. DD:  09/29/00 TD:  09/29/00 Job: [id number]

## 2010-09-11 NOTE — H&P (Signed)
[name], [name]              ACCOUNT NO.:  [id number]   MEDICAL RECORD NO.:  [id number]          PATIENT TYPE:  INP   LOCATION:  6742                         FACILITY:  [facility]   PHYSICIAN:  Thornton Park. Daphine Deutscher, MD  DATE OF BIRTH:  1951-01-07   DATE OF ADMISSION:  05/18/2004  DATE OF DISCHARGE:                                HISTORY & PHYSICAL   CHIEF COMPLAINT:  Abdominal pain.   [name] is a 60 year old patient with a three-day history of left lower  quadrant pain that began as an ache and gradually worsened to a sharp  unrelenting pain on the day of admission.  He denies any nausea, vomiting,  or diarrhea. He does state the pain is worse with movement.   PAST MEDICAL HISTORY:  1.  Coronary artery disease.  2.  He has undergone bypass surgery in the past with multiple stent      placements.  3.  Hyperlipidemia.  4.  Hypertension.   PAST SURGICAL HISTORY:  None.   ALLERGIES:  LOPRESSOR.   MEDICATIONS:  1.  Protonix 40 mg a day.  2.  Lipitor 80 mg a day.  3.  Coreg 3.125 mg b.i.d.  4.  Altace 5 mg a day.  5.  Plavix 75 mg a day.  6.  Zetia 10 mg a day.  7.  Enteric-coated aspirin 325 mg a day.   SOCIAL HISTORY:  He lives in [place] with his wife.  He works as a  Chief of Staff.  Denies any tobacco or illicit drug use, follows a low-  fat diet.  Drinks about two beers a day.   REVIEW OF SYSTEMS:  As above, otherwise negative.   PHYSICAL EXAMINATION:  VITAL SIGNS:  Temperature 97.3, pulse 57,  respirations 16, blood pressure 134/78, O2 saturation 98% on room air.  GENERAL:  No acute distress.  NECK:  No carotid upstrokes, no bruits, JVD, or thyromegaly.  CHEST:  Clear to auscultation bilaterally without rhonchi.  HEART:  Regular rate and rhythm.  No murmur, rub, or ectopy.  ABDOMEN:  Good bowel sounds.  He does have rebound tenderness, left lower  quadrant tenderness, no rigidity.  EXTREMITIES:  Lower extremities have no peripheral edema, no cyanosis  or  clubbing.  NEUROLOGIC:  Grossly intact.   LABORATORY DATA AND OTHER STUDIES:  White count 9.4, hemoglobin 14.7.  Lipase 27.  Renal function normal.   ASSESSMENT AND PLAN:  1.  Left lower quadrant abdominal pain.  Will check a CT of the abdomen to      rule out acute diverticulitis, appendicitis, or perforated viscus.  2.  Hypertension.  3.  Hyperlipidemia.  4.  Coronary artery disease.  Will have [facility] see the patient      in consultation.   The patient was seen and examined by Dr. Molli Hazard B. Daphine Deutscher.      LB/MEDQ  D:  05/19/2004  T:  05/19/2004  Job:  [id number]   cc:   Dr. Billy Coast B. Daphine Deutscher, MD  [address], [address]  [place]  [place] [address]  Fax:  [phone] 

## 2010-09-11 NOTE — H&P (Signed)
[name], [name]                          ACCOUNT NO.:  [id number]   MEDICAL RECORD NO.:  [id number]                   PATIENT TYPE:  INP   LOCATION:  2038                                 FACILITY:  [facility]   PHYSICIAN:  Verne Grain, MD                DATE OF BIRTH:  [birth year]   DATE OF ADMISSION:  01/11/2004  DATE OF DISCHARGE:                                HISTORY & PHYSICAL   PRIMARY CARE CARDIOLOGIST:  Dr. Gerri Spore   PRIMARY CARE PHYSICIAN:  _____________   CHIEF COMPLAINT:  Chest pain.   HISTORY OF PRESENT ILLNESS:  This is a 60 year old male with past medical  history of coronary artery disease, past medical history of  hypertension/hyperlipidemia/early coronary artery disease with a positive  family history of coronary artery disease (father died of an MI at age 31;  mother with coronary disease in her 27's; a brother with coronary disease).  The patient quit tobacco in 1995, status post coronary artery bypass  grafting at 60 years of age in 1995 with a LIMA to the LAD, saphenous vein  graft to a ramus and a saphenous vein graft to right coronary artery/PDA,  with inferior MI complicated by cardiogenic shock in March of 2004 with  percutaneous intervention to the vein graft feeding the right coronary  artery. 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  He had noted some chest discomfort which  prompted admission earlier this year in April of 2005 with cardiac  catheterization revealing occluded vein graft to a ramus, occluded vein  graft to the right coronary artery/PDA, but patent LIMA to the LAD, ejection  fraction was approximately 51%.  The right coronary artery was 100%, ostial  to LAD was 99% prox, the left circumflex was 75%.  The LAD and left  circumflex lesions were treated with Taxus stent with subsequent improvement  in the patient's symptoms as well as exercise tolerance. The patient reports  that he has had the best summer I have ever had, stating that he has had  more energy and been able to do more physical activity and chest pain free  in the past.  He has taken no chronic nitrates and no sublingual  nitroglycerin since his admission in April of 2005.  However earlier this  week on Thursday, the patient awoke with approximately 5 to 6 out of 10  chest pain which has waxed and waned between a 3 out of 10 and 6 out of 10  since that date.  The pain went away today with  2 sublingual nitroglycerin but subsequently returned prompting the patient  to report to the emergency room.  The patient again was treated with  nitroglycerin which led to resolution of his chest pain.  He is currently  chest pain free.  The patient's EKG reveals some PVCs that were not present  on prior EKG, however, in general a QRS, ST and T wave morphology is not  significantly changed when compared to prior.  The patient's initial set of  cardiac markers are negative.  The patient reports that the pain was a lower  substernal, similar in character to the pain that he was having in April of  2005 but not nearly as severe as the pain that he had with his myocardial  infarction in 2004.  The pain did not clearly radiate, there are no obvious  associated symptoms with the discomfort although the patient did note some  dizziness/diaphoresis with some discomfort that occurred while he was at  work.  The patient has noted no clear aggravating or relieving factors that  he is able to identify.  The pain is not positional, the pain is not  reproduced by palpation.  The pain was improved by nitroglycerin as  described above.   PAST MEDICAL HISTORY:  1.  Early coronary artery disease with a coronary artery bypass graft at age      of 28 years, and a positive family history of coronary artery disease.      The CABG was in 1995 with a LIMI  to the LAD vein graft to a ramus and a      vein graft to the right coronary artery/PDA with most recent cardiac      catheterization and intervention in April of 2005 with finding of an LAD      99% prox treated with a Taxus sent, left circumflex 75% treated with a      Taxus stent, right coronary artery 100%, ostial vein graft to the ramus      was occluded, vein graft to the right coronary artery/PDA was occluded,      LIMA to the LAD was patent.  The patient has had previous percutaneous      intervention and stenting to the vein graft feeding the right coronary      artery PDA in March of 2004 at which time the patient had presented with      an inferior myocardial infarction; unfortunately this vein graft was      occluded on the most recent cardiac catheterization.  2.  Hypertension.  3.  Hyperlipidemia.  4.  Gastroesophageal reflux symptoms on chronic PPI therapy.  5.  Low baseline heart rate in the 40's, asymptomatic.  6.  History of METOPROLOL ALLERGY which apparently caused hives.   ALLERGIES:  Adverse reactions:  Lopressor which causes hives.   MEDICATIONS:  1.  Aspirin 325 mg p.o. daily.  2.  Lipitor 80 mg p.o. q.h.s.  3.  Plavix 75 mg p.o. daily.  4.  Coreg 3.125 mg p.o. b.i.d.  5.  Zetia 10 mg p.o. daily.  6.  Prilosec 20 mg p.o. daily.   FAMILY HISTORY:  The patient's father died at age 26 of a fatal myocardial  infarction; patient's mother had coronary artery disease that began in her  98's, however she alive at age 20.  The patient's brother also has history  of coronary disease.   SOCIAL HISTORY:  The patient quit smoking in 1995; he consumes approximately  2 beers/day; denies any illicit drug use.  He is employed at [employer] and lives in [place].   REVIEW OF SYSTEMS:  Essentially negative other than that described in the  HPI.  PAST MEDICAL  HISTORY:  The patient reports no acute changes in auditory or  visual acuity.  No fevers or  chills.  No bowel or bladder complaints.  No  acute rash.  His neuropsychiatric status is stable.  The patient reports no  heat or cold intolerance.  No weight gain or weight loss. All other systems  are negative.   PHYSICAL EXAMINATION:  VITAL SIGNS:  Temperature 98.1, heart rate 60, blood  pressure 132/78, oxygen saturation 97% on room air, respirations 18.  GENERAL:  The patient is pleasant, cooperative, answers questions  appropriately.  HEENT:  Head is normocephalic, atraumatic. EOMI.  Pupils are equal, round  and reactive to light.  Oropharynx is pink and moist without lesions.  NECK:  Supple, there is no evidence of carotid bruit, there is no jugular  venous distension.  LUNGS:  Clear bilaterally.  CARDIOVASCULAR:  Exam reveals a regular S1 and regular S2, there are no  murmurs.  ABDOMEN:  Exam is soft, nontender, nondistended with positive bowel sounds.  FEMORAL EXAM:  Reveals 2+ femoral pulses, there are no femoral bruits.  LOWER EXTREMITY EXAM:  Reveals no evidence of edema.  Distal pulses are 2+  and symmetric bilaterally.  BRIEF SKIN EXAM:  Reveals evidence of chronic sun exposure, there are no  acute rashes.  BRIEF NEUROLOGIC EXAM:  Reveals no evidence of focal deficits.  The patient  is able to move all 4 extremities, strength and sensation grossly intact  throughout.   LABORATORY VALUES:  Sodium 137, potassium 3.8, chloride 110, bicarb 25, BUN  9, creatinine 0.8, glucose 109, calcium 8.9, albumin 3.6.  Hematocrit 43,  white blood cell count 8,000, platelet count 299,000.  AST 20, ALT 24,  alkaline phosphatase 51, total bilirubin 0.4, myoglobin 12.2, CK-MB less  than 1.0, troponin less than 0.05.  PT 11.2, INR 0.8, PTT 26, lipase 30,  amylase 75, AST 20, ALT 24, alkaline phosphatase 51, total bilirubin 0.4.   EKG:  Normal sinus rhythm with 2 PVC's, rate of 61, PR 144, QRS 88, QTC 426.   Chest x-ray:  No acute disease.   ASSESSMENT AND PLAN:  A 60 year old male with  positive family history of  coronary artery disease and personal history of early coronary artery  disease status post coronary artery bypass grafting in 1995 with a LIMA to  the LAD, saphenous vein graft to the ramus, saphenous vein graft to the  RCA/PDA with history of inferior myocardial infarction and percutaneous  coronary intervention to the vein graft feeding the right coronary artery,  however, on more recent cardiac catheterization's the patient has been found  to have occlusion of the right coronary artery vein graft as well as the  ramus vein graft to his LIMA, however has been patent and the patient  underwent Taxus stenting to the LAD and left circumflex coronary artery in  April, 2005, subsequently with recurrent chest pain symptoms that are  similar to his presentation in April of 2005.  Cardiac markers negative x1. EKG with evidence of PVC's but otherwise no marked changes when compared to  priors.   1.  Chest pain with coronary artery disease as outlined above.  Will admit      the patient for rule out a myocardial infarction, monitor on telemetry      and serial EKG's to exclude myocardial infarction while awaiting      presumed cardiac catheterization on Monday.  The patient is not to be      treated with  Metoprolol as he has a documented allergy with hives      commented in the past.  He however has tolerated low-dose carvedilol      well (beta blocker dosing limited by low baseline heart rate in the      40's/asymptomatic).  We will continue the patient on Coreg as previously      prescribed.  We will decrease the patient's aspirin dose to 81 mg in      light of ongoing Plavix therapy status post Taxus stent in April, 2005      (Plavix to be continued until at least October, 2005).  Will also add      low dose ramipril 2.5 mg p.o. daily to help optimize his coronary artery      disease regimen.  We will follow his creatinine and potassium with the      additional of  this ACE inhibitor to assure that he tolerates it from a      renal stand-point as well as optimal blood pressure stand-point.      Continue Lipitor 80 mg p.o. q.h.s. with Zetia as previously prescribed.      Will also check a homocysteine with a.m. labs to look for any other      possible treatable/reversible causes that may have contributed to the      patient's early coronary artery disease.   1.  Hyperlipidemia.  Continue the patient on Lipitor 80 mg p.o. q.h.s./Zetia      10 mg p.o. daily.  The patient's liver function tests on current      admission are normal.  Will check a lipid profile in the morning to      confirm that the patient's lipid profile is at goal (LDL less than 70,      triglycerides less than 150).   1.  Hypertension.  Continue the patient on Coreg with the addition of Altace      as mentioned above to assure that we achieve goal blood pressure (blood      pressure less than 135/85).   1.  History of gastric esophageal reflux symptoms.  Will continue the      patient on empiric proton pump inhibitor therapy for optimal treatment      of his gastric esophageal reflux symptoms.       DDH/MEDQ  D:  01/11/2004  T:  01/12/2004  Job:  [id number]

## 2010-09-11 NOTE — Cardiovascular Report (Signed)
[employer] [facility]  Patient:    [name], [name]                       MRN: [id number] Proc. Date: 09/28/00 Adm. Date:  [id number] Attending:  Daisey Must CC:         Lorita Officer, M.D., [facility], [place].  Daisey Must, M.D. [facility]  Cardiopulmonary Laboratory   Cardiac Catheterization  PROCEDURES PERFORMED:  Cardiac catheterization.  CLINICAL HISTORY:  The patient is 60 years old, had previous bypass surgery in [place], [place] in 1995.  He says he has had three catheterizations since that time and the last of which showed only minor blockage.  He recently moved here for work-related reasons.  He developed chest pain and was admitted by Dr. Gerri Spore with a diagnosis of possible unstable angina.  DESCRIPTION OF PROCEDURE:  The procedure was performed via the right femoral artery using an arterial sheath and 6 French preformed coronary catheters.  A front wall arterial puncture was performed and Omnipaque contrast was used. The LIMA graft was injected with a LIMA graft catheter.  The saphenous vein graft to the right coronary artery was injected with a right bypass graft catheter.  The patient tolerated the procedure well and left the laboratory in satisfactory condition.  RESULTS:  The left main coronary artery:  The left main coronary artery had a 30% stenosis in its midportion that had the appearance of a niche.  Left anterior descending:  The left anterior descending artery was completely occluded after a large septal perforator.  Circumflex artery:  The circumflex artery gave rise to a marginal branch and AV branch and an intermediate branch which was totally occluded.  There was 30% narrowing in the proximal vessel and there was less than 10% narrowing in the proximal vessel at the site of a previous stent.  This vessel was free of significant disease.  Right coronary artery:  The right coronary was completely occluded at its origin.  The  saphenous vein graft to the intermediate branch of the circumflex artery was completely occluded at its origin.  This appeared to be a recent graft since there was a trickle of flow down the proximal portion of the graft.  The sequential saphenous vein graft of the marginal and posterior descending branch of the right coronary artery was patent but there was 70% narrowing after the marginal branch and there was slow flow down the graft with hypodensity in the distal portion of the graft.  The distal graft filled a posterior descending branch which then filled three posterolateral branches.  The LIMA graft to the LAD was patent and free of significant discharge.  LEFT VENTRICULOGRAPHY:  The left ventriculogram was performed in the RAO projection showed good wall motion with no areas of hypokinesis.  The estimated ejection fraction was 55%.  The aortic pressure was 130/77 with a mean of 89.  The left ventricular pressure was 130/22.  CONCLUSIONS:  Coronary artery disease, status post coronary artery bypass graft surgery x4 in 1995 with 30% narrowing of the left main coronary artery, total occlusion of the left anterior descending artery, total occlusion of the intermediate branch of the circumflex artery with less than 10% stenosis at the stent site in the proximal circumflex artery, total occlusion of the right coronary artery, patent left internal mammary artery graft to the left anterior descending, and occluded vein graft to the circumflex intermediate branch, a patent vein graft to the marginal and posterior  descending branch of the right coronary artery but with 70% weblike stenosis in the mid to distal graft and hypodensity of the distal graft with slow flow and normal left ventricular function.  The patient has an occlusion of the vein graft to the circumflex intermediate vessel which appears to be new or recent.  He also has disease in the vein graft to the right coronary  artery.  This graft appears very difficult for intervention because there appears to be a hypodense area in the distal portion of the graft which probably represents grimace material which would make intervention difficult and somewhat risky due to the risk of distal embolization.  I reviewed these films with Dr. Corinda Gubler and Dr. Chales Abrahams and I discussed the films with Dr. Gerri Spore.  We will plan initial medical therapy and plan a Cardiolite scan to further evaluate whether he has ischemia in the distribution of the right coronary artery. DD:  09/28/00 TD:  09/28/00 Job: [id number]

## 2010-09-11 NOTE — Cardiovascular Report (Signed)
[name], [name]                          ACCOUNT NO.:  [id number]   MEDICAL RECORD NO.:  [id number]                   PATIENT TYPE:  INP   LOCATION:  4737                                 FACILITY:  [facility]   PHYSICIAN:  Carole Binning, M.D. [facility]         DATE OF BIRTH:  26-Aug-1950   DATE OF PROCEDURE:  08/16/2003  DATE OF DISCHARGE:                              CARDIAC CATHETERIZATION   PROCEDURES PERFORMED:  1. Left heart catheterization with,  2. Coronary angiography,  3. Bypass graft angiography and,  4. Left ventriculography.  5. Percutaneous transluminal coronary angioplasty with placement of a drug-     eluting stent in the proximal left anterior descending artery.  6. Percutaneous transluminal coronary angioplasty with placement of a drug-     eluting stent in the proximal left circumflex coronary artery.  7. Intravascular ultrasound of the proximal left anterior descending artery     and the left main coronary artery.   CARDIOLOGIST:  Carole Binning, M.D.   INDICATIONS:  [name] is a 60 year old male with a history of previous  coronary artery bypass surgery and previous percutaneous coronary  interventions to the vein graft to the distal right coronary artery.  He  presented yesterday with symptoms compatible with unstable angina.  He was  stabilized on medical therapy and referred for cardiac catheterization.   CATHETERIZATION PROCEDURAL NOTE:  A 6 French sheath was placed in the right  femoral artery.  Native coronary angiography was performed with 6 [place] JL-  4 and JR-4 catheters.  The saphenous vein grafts were imaged with the JL-4  catheter.  The left internal mammary artery was imaged with an internal  mammary catheter.  Left ventriculography was performed with an angled  pigtail catheter.   CONTRAST MATERIAL:  Contrast used was Omnipaque.   CATHETERIZATION RESULTS:   HEMODYNAMIC DATA:  Left ventricular pressure 130/20.  Aortic pressure  130/72.  There is no aortic valve gradient.   VENTRICULOGRAPHIC DATA:  Left Ventriculogram:  There is mild akinesis of the  inferobasal wall.  Ejection fraction was calculated at 51%.  There is trace  mitral regurgitation.   CORONARY ARTERIOGRAPHIC DATA:  Coronary Arteriography (Right Dominant)   Left Main:  The left main has a diffuse eccentric 40-50% stenosis with a  shelf-like plaque.  This is unchanged from previous catheterizations.   Left Anterior Descending Artery:  The left anterior descending artery has  99% stenosis in the proximal vessel.  Following this the mid LAD is 100%  occluded after a very large branching septal perforator.  Prior to its  occlusion the LAD gives rise to a small diagonal branch, which has a 99%  stenosis at its origin.  The distal LAD fills via left internal mammary  graft.   Left Circumflex:  The left circumflex has a stent in the proximal vessel,  which extends into the proximal portion of the first obtuse marginal branch.  There  is a 75% stenosis within this stent.  This was a GR-II stent that was  placed several years ago.  Beyond the stent in the large obtuse marginal  branch there is a 40% stenosis.  Th circumflex gives rise to a small-  to0noirmal size ramus intermedius, which is 100% occluded at its origin and  fills via the left-to-left collaterals.  It also gives rise to a large first  marginal branch as described above.  There is a 40% stenosis in the first  marginal branch beyond the stent.   Right Coronary Artery:  The right coronary artery is 100% occluded at its  origin.  The distal right coronary artery consisting of a posterior  descending artery and the posterolateral branches, which are relatively  small in size fill via left-to-right collaterals.   Grafts:  Left internal mammary artery to the LAD is patent; however, there  is a 40% stenosis at the ostium of the left internal mammary artery.  This  fills the mid and distal LAD  including two diagonal branches.   Saphenous vein graft to the ramus intermedius  is 100% occluded.  This is  unchanged from previous catheterizations.   Saphenous vein graft to the posterior descending artery is 100% occluded  proximally.  There are three previously placed stents in this graft;  however, it appears that it likely occluded shortly after these stents were  placed as this does appear to be chronic in nature.   IMPRESSION:  1. Mildly decreased left ventricular systolic function.  2. Severe native three-vessel coronary artery disease.  3. Status post coronary bypass surgery; both vein grafts are occluded.     There is a patent mammary to the left anterior descending.  The native     circulation on the left is jeopardized by 99% stenosis in the proximal     left anterior descending prior to a very large septal perforator.  There     is also 75% stenosis within the previously placed stent in the left     circumflex and moderate disease in the left main coronary artery.   PLAN:  These findings were reviewed with [name] and discussed at length  with both the patient and his wife.  The options presented were redo  coronary artery bypass surgery versus percutaneous intervention of the left  anterior descending artery and the left circumflex.  As the targets of the  distal right coronary artery and the ranus intermedius do not appear to be  favorable for redo CABG, after a lengthy discussion we opted to proceed with  percutaneous intervention of the left coronaries; see below.   PERCUTANEOUS TRANSLUMINAL CORONARY ANGIOPLASTY PROCEDURAL NOTE:  Following  completion of the diagnostic catheterization we proceeded with percutaneous  coronary intervention.  We utilized the preexisting 6 French sheath in the  right femoral artery.  The patient had been pretreated with Integrilin and  this was continued through the case.  Additional heparin was administered to maintain and ACT of  greater than 200 seconds,   We used a 6 [place] CLS-3.5 guiding catheter.  A BMW wire was advanced under  fluoroscopic guidance beyond the stenosis in the proximal LAD and positioned  in the distal portion of the septal perforator.  We then performed PTCA with  a 2.5 x 12 mm Quantum balloon in the proximal LAD inflated to 10  atmospheres.  We initially placed a 2.5 x 12 mm Quantum stent across the  lesion in the proximal LAD;  however, the stent proved to be too long as the  proximal stent protruded into the left main.  We therefore removed the stent  and positioned a 2,5 x 8 mm TAXUS drug-eluting stent in the proximal LAD.  We deployed the stent at a deployment pressure of 18 atmospheres.  We then  went back with a 3.23 x 8 mm Quantum balloon within the stent and inflated  it to 14 atmospheres.   Final angiographic images were obtained revealing patency of the LAD with 0%  residual stenosis at the stent site and TIMI III flow.   We then turned our attention to the left circumflex.  We redirected our BMW  wire beyond the stenosis and in the proximal circumflex, and positioned the  tip of the wire in the first obtuse marginal branch.  We then performed PTCA  with a 2.5 x 12 mm Quantum balloon inflated to 12 atmospheres.  We then  advanced a 2.5 x 12 mm TAXUS drug-eluting stent and positioned it across the  lesion in the proximal circumflex.  The stent was deployed at 16  atmospheres.   Final angiographic images were obtained revealing patency of the left  circumflex with 0% residual stenosis at the stent site and TIMI III flow  into the distal vessel.   To better assess the left main coronary artery with proceeded with  intravascular ultrasound.  We initially attempted to pass our intravascular  ultrasound catheter over the wire in the left circumflex; however, because  of the sharp bend of the circumflex out to the left main the catheter would  not pass.  We therefore repositioned our  wire in the left anterior  descending artery and performed an advanced intravascular ___________ over  this wire.  We did perform intravascular ultrasound of the stent we placed  in the LAD as well as the left main coronary artery.  This revealed that the  stent in the LAD was very well deployed and opposed to the vessel wall.  The  left main coronary had a moderate diffuse stenosis in the mid-to-distal  vessel, which was approximately 40-50% by IVUS measurements.  The minimal  lumen diameter was  2.5 x 3 mm compared to a diameter of 3.0 x 3.0 mm  distally and approximately 5.0 x 4.0 proximally.  The cross sectional area  of the tightest area of the disease in the left main coronary artery was 5.9  mm squared,  this is right on the borderline as to whether this would be  hemodynamically significant.  In review of this the left main had not  changed significantly over previous catheterizations over the past three years.  Furthermore, with the eccentric nature of the plaque in the left  main coronary artery and in particular, the size mismatch between the  proximal and distal left main, it was felt that it would be very difficult  to deploy a stent in the left main and have it fully expanded and opposed to  the vessel wall.  The left main is very heavily calcified.   Because of these technical factors and the stable nature of this disease we  opted not to treat the left main coronary artery.   IVUS catheter and guidewire were removed and final angiographic images were  obtained with results as described above.   COMPLICATIONS:  None.   RESULTS:  1. Successful percutaneous transluminal coronary angioplasty with placement     of a drug-eluting stent in the proximal left anterior  descending artery.     A 99% stenosis was reduced to 0% residual and TIMI III flow.  2. Successful percutaneous transluminal coronary angioplasty with placement     of a drug-eluting stent in the proximal left  circumflex.  A 75% in-stent     restenosis was reduced to 0% residual and TIMI III flow.   PLAN:  Integrilin will be continued overnight.  The patient will be treated  with Plavix for  recommended one year.  He will be managed medically for his  residual disease.  If he does have continued anginal symptoms we may  reconsider the option of coronary artery bypass surgery.                                               Carole Binning, M.D. [facility]    MWP/MEDQ  D:  08/16/2003  T:  08/18/2003  Job:  [id number]   cc:   Hedda Slade, M.D.   Cardiac Catheterization Laboratory

## 2010-09-11 NOTE — H&P (Signed)
. [facility]  Patient:    [name], [name]                       MRN: [id number] Adm. Date:  [id number] Attending:  Daisey Must Dictator:   Abelino Derrick, P.A.C. [facility]                         History and Physical  CHIEF COMPLAINT:  Chest pressure.  HISTORY OF PRESENT ILLNESS:  The patient is a 60 year old male with a history of coronary disease.  He had bypass surgery x 4 in 1995 at [facility] in [place] with an LIMA to the LAD, SVG to the ramus intermedius, and SVG to the PDA and acute marginal.  He apparently moved to [place], [place], a couple of years ago and had some chest pain.  He had a Cardiolite study at [facility] that was negative, according to the patient.  He has had some recurrent chest pain since. Endoscopy six months ago at [facility] revealed some gastritis.  He was admitted on September 27, 2000, after he developed substernal chest pressure which was different from his reflux symptoms.  He had some radiation to his back and his arm and had some associated fatigue and dizziness.  PAST MEDICAL HISTORY:  Otherwise unremarkable, except for hyperlipidemia for which he is on Lipitor.  CURRENT MEDICATIONS: 1. Celexa 20 mg a day. 2. Prilosec. 3. Atenolol 25 mg a day. 4. Lipitor 80 mg a day. 5. Altace 10 mg a day.  ALLERGIES:  He is allergic to [facility], which has caused a rash.  SOCIAL HISTORY:  He is a nonsmoker and quit in 1995.  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  He recently has moved to [place], [place], and now needs a cardiologist here.  FAMILY HISTORY:  Remarkable in that his father died at 29 of an MI.  His mother has a history of coronary artery disease, but she is alive at 30.  One brother has a history of angina.  REVIEW OF SYSTEMS:  Essentially unremarkable, except for noted above.  There is no history of GI bleeding or peptic ulcer disease.  There is no  history of palpitations, syncope, or arrhythmias.  There is no history of kidney disease. He does have occasional orthostatic dizziness.  PHYSICAL EXAMINATION:  VITAL SIGNS:  Blood pressure 136/76, pulse 55, temperature 98.6 degrees.  GENERAL APPEARANCE:  He is a well-developed, well-nourished male in no acute distress.  HEENT:  Normocephalic.  Extraocular movements are intact.  The sclerae are anicteric.  The lids and conjunctivae are within normal limits.  NECK:  Without bruit and without JVD.  CHEST:  Clear to auscultation and percussion.  CARDIAC:  Regular rate and rhythm without murmurs, rubs, or gallops.  Normal S1 and S2.  ABDOMEN:  Nontender.  No hepatosplenomegaly.  EXTREMITIES:  Without edema and without bruit.  Distal pulses are intact.  NEUROLOGIC:  Exam is grossly intact.  He is awake, alert, oriented, and cooperative.  LABORATORY DATA:  The EKG reveals sinus rhythm with anterior T-wave inversion.  IMPRESSION: 1. Unstable angina. 2. Coronary disease, status post coronary artery bypass grafting in 1995.  He    apparently had an intervention with an angioplasty and stent in 2000 in [place].  He was catheterized after this in October of 2000 and was told this  was "okay."  He apparently had a negative Cardiolite scan in 2001 at [facility]. 3. Gastroesophageal reflux disease. 4. Hyperlipidemia. 5. Ex-smoker.  PLAN:  We will obtain the operative note and office records from his primary M.D. in [place].  He will be admitted to telemetry and started on heparin and nitrates.  Further evaluation, including possible catheterization, will be discussed with the M.D. DD:  09/29/00 TD:  09/29/00 Job: [id number]

## 2010-10-25 HISTORY — PX: CORONARY STENT PLACEMENT: SHX1402

## 2010-11-13 HISTORY — PX: CARDIAC CATHETERIZATION: SHX172

## 2010-12-02 ENCOUNTER — Ambulatory Visit (INDEPENDENT_AMBULATORY_CARE_PROVIDER_SITE_OTHER): Payer: 59 | Admitting: Internal Medicine

## 2010-12-02 ENCOUNTER — Encounter: Payer: Self-pay | Admitting: Internal Medicine

## 2010-12-02 VITALS — BP 150/80 | HR 62 | Temp 98.3°F | Ht 65.0 in | Wt 146.0 lb

## 2010-12-02 DIAGNOSIS — M79609 Pain in unspecified limb: Secondary | ICD-10-CM

## 2010-12-02 DIAGNOSIS — I2510 Atherosclerotic heart disease of native coronary artery without angina pectoris: Secondary | ICD-10-CM

## 2010-12-02 DIAGNOSIS — M79606 Pain in leg, unspecified: Secondary | ICD-10-CM | POA: Insufficient documentation

## 2010-12-02 DIAGNOSIS — Z136 Encounter for screening for cardiovascular disorders: Secondary | ICD-10-CM

## 2010-12-02 NOTE — Assessment & Plan Note (Signed)
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

## 2010-12-02 NOTE — Patient Instructions (Signed)
Please set up heart and leg circulation tests

## 2010-12-02 NOTE — Assessment & Plan Note (Signed)
Vague Unclear etiology Will check vascular status

## 2010-12-02 NOTE — Progress Notes (Signed)
Subjective:    Patient ID: [name], male    DOB: October 15, 1950, 60 y.o.   MRN: [id number]  HPI Had 2 MIs and got 2 new stents at [facility] in [facility] July 20th Visiting new granddaughter Got chest pain not relieved with NTG Old stent got clogged and 2 new put in  Promus Element Plus stents  Started on lisinopril Back on the plavix  No chest pain but "I haven't bounced back like the other one" Very tired Pain in ankles and legs Has easy DOE now After coming home, he had trip to [place] for his job  Has tried going back to work but is tiring out and needs nap by 2PM  No edema-though they feel "thick" Some pain walking on feet also No orthopnea or PND  Definitely "overwhelmed" but not sure about depression Nervous about travelling for work  Current Outpatient Prescriptions on File Prior to Visit  Medication Sig Dispense Refill  . aspirin 325 MG tablet Take 325 mg by mouth daily.        . carvedilol (COREG) 3.125 MG tablet Take 1 tablet (3.125 mg total) by mouth 2 (two) times daily.  60 tablet  11  . nitroGLYCERIN (NITROSTAT) 0.4 MG SL tablet Place 0.4 mg under the tongue every 5 (five) minutes as needed.        Marland Kitchen omeprazole (PRILOSEC) 20 MG capsule Take 20 mg by mouth daily.        . simvastatin (ZOCOR) 40 MG tablet Take 1 tablet (40 mg total) by mouth at bedtime.  30 tablet  11    Allergies  Allergen Reactions  . Metoprolol Tartrate     REACTION: HIVES AND ITCHING    Past Medical History  Diagnosis Date  . CAD (coronary artery disease)   . GERD (gastroesophageal reflux disease)   . Hyperlipidemia   . Hypertension   . Diverticulitis     1/06 Diverticulitis--CT of pelvis--difuse sigmoid divertic    Past Surgical History  Procedure Date  . Coronary artery bypass graft   . Closed reduction shoulder dislocation   . Angioplasty 1997  . Carotid stent     05/2002 IWMI with RCA stent, 4/05 LAD/LCX stents   EF-51%  . Cardiac catheterization     8/09   Cath--vein graft occlusions which are old--no acute changes  . Coronary stent placement 7/12    2 stents--Promus element plus (everolimus eluting)--[facility] in [place]    Family History  Problem Relation Age of Onset  . Heart disease Mother   . Hypertension Mother   . Heart disease Father   . Coronary artery disease Sister   . Coronary artery disease Brother   . Diabetes Neg Hx   . Cancer Neg Hx     prostate or colon    History   Social History  . Marital Status: Married    Spouse Name: N/A    Number of Children: 3  . Years of Education: N/A   Occupational History  . Merchandiser, retail( Therapist, sports)    Social History Main Topics  . Smoking status: Current Everyday Smoker  . Smokeless tobacco: Never Used  . Alcohol Use: 7.2 oz/week    12 Cans of beer per week  . Drug Use: Not on file  . Sexually Active: Not on file   Other Topics Concern  . Not on file   Social History Narrative  . No narrative on file   Review of Systems Not sleeping well--relates to  leg pain    Objective:   Physical Exam  Constitutional: He appears well-developed and well-nourished. No distress.  Neck: Normal range of motion. Neck supple. No thyromegaly present.  Cardiovascular: Normal rate, regular rhythm and normal heart sounds.  Exam reveals no gallop.   No murmur heard.      occ skip 1+ pulse on right foot, weak on left  Pulmonary/Chest: Effort normal and breath sounds normal. No respiratory distress. He has no wheezes. He has no rales.  Musculoskeletal: Normal range of motion. He exhibits no edema and no tenderness.  Lymphadenopathy:    He has no cervical adenopathy.  Psychiatric:       Some psychomotor retardation Allows wife to take lead Seems depressed          Assessment & Plan:

## 2010-12-04 ENCOUNTER — Other Ambulatory Visit (HOSPITAL_COMMUNITY): Payer: 59 | Admitting: Radiology

## 2010-12-11 ENCOUNTER — Encounter (INDEPENDENT_AMBULATORY_CARE_PROVIDER_SITE_OTHER): Payer: 59 | Admitting: *Deleted

## 2010-12-11 ENCOUNTER — Other Ambulatory Visit (INDEPENDENT_AMBULATORY_CARE_PROVIDER_SITE_OTHER): Payer: 59 | Admitting: *Deleted

## 2010-12-11 DIAGNOSIS — I2510 Atherosclerotic heart disease of native coronary artery without angina pectoris: Secondary | ICD-10-CM

## 2010-12-11 DIAGNOSIS — M79606 Pain in leg, unspecified: Secondary | ICD-10-CM

## 2010-12-11 DIAGNOSIS — I739 Peripheral vascular disease, unspecified: Secondary | ICD-10-CM

## 2010-12-11 DIAGNOSIS — R0602 Shortness of breath: Secondary | ICD-10-CM

## 2010-12-15 ENCOUNTER — Encounter: Payer: Self-pay | Admitting: Internal Medicine

## 2010-12-18 ENCOUNTER — Encounter: Payer: 59 | Admitting: *Deleted

## 2010-12-18 ENCOUNTER — Other Ambulatory Visit (HOSPITAL_COMMUNITY): Payer: 59 | Admitting: Radiology

## 2010-12-21 ENCOUNTER — Encounter: Payer: 59 | Admitting: Cardiology

## 2010-12-30 ENCOUNTER — Ambulatory Visit (INDEPENDENT_AMBULATORY_CARE_PROVIDER_SITE_OTHER): Payer: 59 | Admitting: Internal Medicine

## 2010-12-30 ENCOUNTER — Encounter: Payer: Self-pay | Admitting: Internal Medicine

## 2010-12-30 VITALS — BP 125/70 | HR 59 | Temp 98.3°F | Ht 65.0 in | Wt 146.0 lb

## 2010-12-30 DIAGNOSIS — E785 Hyperlipidemia, unspecified: Secondary | ICD-10-CM

## 2010-12-30 DIAGNOSIS — M79609 Pain in unspecified limb: Secondary | ICD-10-CM

## 2010-12-30 DIAGNOSIS — I10 Essential (primary) hypertension: Secondary | ICD-10-CM

## 2010-12-30 DIAGNOSIS — G479 Sleep disorder, unspecified: Secondary | ICD-10-CM | POA: Insufficient documentation

## 2010-12-30 DIAGNOSIS — I2510 Atherosclerotic heart disease of native coronary artery without angina pectoris: Secondary | ICD-10-CM

## 2010-12-30 DIAGNOSIS — M79606 Pain in leg, unspecified: Secondary | ICD-10-CM

## 2010-12-30 LAB — CBC WITH DIFFERENTIAL/PLATELET
Basophils Absolute: 0.1 10*3/uL (ref 0.0–0.1)
Basophils Relative: 0.7 % (ref 0.0–3.0)
Eosinophils Absolute: 0.3 10*3/uL (ref 0.0–0.7)
Eosinophils Relative: 3.5 % (ref 0.0–5.0)
HCT: 42.1 % (ref 39.0–52.0)
Lymphs Abs: 2 10*3/uL (ref 0.7–4.0)
MCHC: 33.8 g/dL (ref 30.0–36.0)
MCV: 101 fl — ABNORMAL HIGH (ref 78.0–100.0)
Monocytes Absolute: 0.7 10*3/uL (ref 0.1–1.0)
Platelets: 287 10*3/uL (ref 150.0–400.0)
WBC: 7.3 10*3/uL (ref 4.5–10.5)

## 2010-12-30 LAB — MAGNESIUM: Magnesium: 2.4 mg/dL (ref 1.5–2.5)

## 2010-12-30 LAB — CK: Total CK: 43 U/L (ref 7–232)

## 2010-12-30 LAB — LDL CHOLESTEROL, DIRECT: Direct LDL: 125.3 mg/dL

## 2010-12-30 LAB — HEPATIC FUNCTION PANEL
Alkaline Phosphatase: 38 U/L — ABNORMAL LOW (ref 39–117)
Bilirubin, Direct: 0 mg/dL (ref 0.0–0.3)
Total Bilirubin: 0.5 mg/dL (ref 0.3–1.2)
Total Protein: 6.9 g/dL (ref 6.0–8.3)

## 2010-12-30 LAB — SEDIMENTATION RATE: Sed Rate: 10 mm/hr (ref 0–22)

## 2010-12-30 LAB — BASIC METABOLIC PANEL
BUN: 9 mg/dL (ref 6–23)
CO2: 28 mEq/L (ref 19–32)
Chloride: 100 mEq/L (ref 96–112)
Creatinine, Ser: 0.7 mg/dL (ref 0.4–1.5)
Potassium: 4.7 mEq/L (ref 3.5–5.1)

## 2010-12-30 LAB — LIPID PANEL
Cholesterol: 203 mg/dL — ABNORMAL HIGH (ref 0–200)
VLDL: 51.6 mg/dL — ABNORMAL HIGH (ref 0.0–40.0)

## 2010-12-30 MED ORDER — MIRTAZAPINE 15 MG PO TABS: 15.0000 mg | ORAL_TABLET | Freq: Every day | ORAL | Status: DC

## 2010-12-30 NOTE — Assessment & Plan Note (Signed)
This may be causing some of his other symptoms Will try mirtazapine

## 2010-12-30 NOTE — Assessment & Plan Note (Signed)
Mild LV hypokinesis and diastolic dysfunction Back to work Will set up for follow up with Dr Mariah Milling

## 2010-12-30 NOTE — Assessment & Plan Note (Signed)
No explanation for this ABI was fine and has palpable pulses Not clearly related to meds (nothing new)

## 2010-12-30 NOTE — Patient Instructions (Signed)
Please try the new medication, mirtazapine, at bedtime Please set up the cardiology appointment

## 2010-12-30 NOTE — Progress Notes (Signed)
Subjective:    Patient ID: [name], male    DOB: 1950/04/27, 60 y.o.   MRN: [id number]  HPI Reviewed info from [facility] where MI/stents done Reviewed echo--- normal EF with mild damage, mild diastolic dysfunction  Has been travelling still Very tired--having sleep problems  Having ongoing leg pain since in the hospital Fairly constant---ankles feel like they are swollen (though they aren't) Knee pain also and calves Not usually in thighs, though some preceding hip pain Hurts in bed, steady with walking (no worse or better)---but has to stop multiple times due to fatigue (and pain somewhat worse) Able to resume after brief sitting rest  No new meds  He has decided to try to continue to work Has to travel for work still  No chest pain Does have some DOE---but only with "overexertion". Some decrease in exercise tolerance (like has to stop weed whacking after 30 minutes and rest) No palpitations  Long standing sleep problems Initiates 8-9PM ---may take 1 hour. Up 1-2 hours later and may take 3-4 hours to get back to sleep No sleep meds except during past hospitalizations  Less stress now that decision about working has been made Some anxiety about his abilities Not really depressed  Current Outpatient Prescriptions on File Prior to Visit  Medication Sig Dispense Refill  . aspirin 325 MG tablet Take 325 mg by mouth daily.        . calcium carbonate (TUMS EX) 750 MG chewable tablet Chew 1 tablet by mouth daily.        . carvedilol (COREG) 3.125 MG tablet Take 1 tablet (3.125 mg total) by mouth 2 (two) times daily.  60 tablet  11  . clopidogrel (PLAVIX) 75 MG tablet Take 75 mg by mouth daily.        Marland Kitchen lisinopril (PRINIVIL,ZESTRIL) 5 MG tablet Take 5 mg by mouth daily.        . naproxen sodium (ANAPROX) 220 MG tablet Take 220 mg by mouth as needed.        . nitroGLYCERIN (NITROSTAT) 0.4 MG SL tablet Place 0.4 mg under the tongue every 5 (five) minutes as  needed.        Marland Kitchen omeprazole (PRILOSEC) 20 MG capsule Take 20 mg by mouth daily.        . simvastatin (ZOCOR) 40 MG tablet Take 1 tablet (40 mg total) by mouth at bedtime.  30 tablet  11    Allergies  Allergen Reactions  . Metoprolol Tartrate     REACTION: HIVES AND ITCHING    Past Medical History  Diagnosis Date  . CAD (coronary artery disease)   . GERD (gastroesophageal reflux disease)   . Hyperlipidemia   . Hypertension   . Diverticulitis     1/06 Diverticulitis--CT of pelvis--difuse sigmoid divertic    Past Surgical History  Procedure Date  . Coronary artery bypass graft   . Closed reduction shoulder dislocation   . Angioplasty 1997  . Carotid stent     05/2002 IWMI with RCA stent, 4/05 LAD/LCX stents   EF-51%  . Cardiac catheterization     8/09  Cath--vein graft occlusions which are old--no acute changes  . Coronary stent placement 7/12    2 stents--Promus element plus (everolimus eluting)--[facility] in [place]    Family History  Problem Relation Age of Onset  . Heart disease Mother   . Hypertension Mother   . Heart disease Father   . Coronary artery disease Sister   . Coronary  artery disease Brother   . Diabetes Neg Hx   . Cancer Neg Hx     prostate or colon    History   Social History  . Marital Status: Married    Spouse Name: N/A    Number of Children: 3  . Years of Education: N/A   Occupational History  . Merchandiser, retail( Therapist, sports)    Social History Main Topics  . Smoking status: Current Everyday Smoker  . Smokeless tobacco: Never Used  . Alcohol Use: 7.2 oz/week    12 Cans of beer per week  . Drug Use: Not on file  . Sexually Active: Not on file   Other Topics Concern  . Not on file   Social History Narrative  . No narrative on file   Review of Systems Appetite is poor---eats small amounts Weight is stable     Objective:   Physical Exam  Constitutional: He appears well-developed and well-nourished. No  distress.  Neck: Normal range of motion. Neck supple. No thyromegaly present.  Cardiovascular: Normal rate, regular rhythm, normal heart sounds and intact distal pulses.  Exam reveals no gallop.   No murmur heard. Pulmonary/Chest: Effort normal and breath sounds normal. No respiratory distress. He has no wheezes. He has no rales.  Musculoskeletal: Normal range of motion. He exhibits no edema.       Mild tenderness in ankles--non specific  Lymphadenopathy:    He has no cervical adenopathy.  Skin: No rash noted.  Psychiatric:       Mild anxiety ??dysthymic          Assessment & Plan:

## 2010-12-30 NOTE — Assessment & Plan Note (Signed)
BP Readings from Last 3 Encounters:  12/30/10 125/70  12/02/10 150/80  09/02/10 160/90   Good control  No changes needed

## 2011-01-04 ENCOUNTER — Encounter: Payer: Self-pay | Admitting: Family Medicine

## 2011-01-04 ENCOUNTER — Ambulatory Visit (INDEPENDENT_AMBULATORY_CARE_PROVIDER_SITE_OTHER): Payer: 59 | Admitting: Family Medicine

## 2011-01-04 VITALS — BP 152/88 | HR 83 | Temp 98.7°F | Wt 144.0 lb

## 2011-01-04 DIAGNOSIS — K5732 Diverticulitis of large intestine without perforation or abscess without bleeding: Secondary | ICD-10-CM

## 2011-01-04 MED ORDER — CIPROFLOXACIN HCL 500 MG PO TABS: 500.0000 mg | ORAL_TABLET | Freq: Two times a day (BID) | ORAL | Status: AC

## 2011-01-04 MED ORDER — METRONIDAZOLE 500 MG PO TABS: 500.0000 mg | ORAL_TABLET | Freq: Three times a day (TID) | ORAL | Status: AC

## 2011-01-04 NOTE — Assessment & Plan Note (Addendum)
Presumed return of diverticulitis, worsened from baseline.  No rebound or peritoneal sx.  Given the hx, I would treat.  Pt to go to ER if profound inc in pain.  He agrees.  Lab and imaging w/u was held as it would likely not change current tx.  He agrees.

## 2011-01-04 NOTE — Progress Notes (Signed)
CC should be "diverticulitis".  H/o diverticular disease.  Now with pain in lower abd since last night.  Felt hot last night, sweaty.  No blood in stool.  No vomiting.  Some nausea.  Watery bowel movements, frequent, similar to prev episode of diverticulitis.  No dysuria.   PMH and SH reviewed  ROS: See HPI, otherwise noncontributory.  Meds, vitals, and allergies reviewed.   nad but uncomfortable due to abd pain Mmm rrr ctab abd soft, ttp in LLQ and lower midline area, below the umbilicus, no masses and no rebound, + BS No cva pain

## 2011-01-04 NOTE — Patient Instructions (Signed)
Start the antibiotics today.  Go to the ER if you have profound increase in pain.  Take care.

## 2011-01-12 ENCOUNTER — Ambulatory Visit (INDEPENDENT_AMBULATORY_CARE_PROVIDER_SITE_OTHER): Payer: 59 | Admitting: Cardiovascular Disease

## 2011-01-12 ENCOUNTER — Telehealth: Payer: Self-pay

## 2011-01-12 ENCOUNTER — Encounter: Payer: Self-pay | Admitting: Cardiovascular Disease

## 2011-01-12 DIAGNOSIS — I10 Essential (primary) hypertension: Secondary | ICD-10-CM

## 2011-01-12 DIAGNOSIS — M79609 Pain in unspecified limb: Secondary | ICD-10-CM

## 2011-01-12 DIAGNOSIS — G479 Sleep disorder, unspecified: Secondary | ICD-10-CM

## 2011-01-12 DIAGNOSIS — I2510 Atherosclerotic heart disease of native coronary artery without angina pectoris: Secondary | ICD-10-CM

## 2011-01-12 DIAGNOSIS — M79606 Pain in leg, unspecified: Secondary | ICD-10-CM

## 2011-01-12 DIAGNOSIS — E785 Hyperlipidemia, unspecified: Secondary | ICD-10-CM

## 2011-01-12 MED ORDER — ROSUVASTATIN CALCIUM 40 MG PO TABS: 40.0000 mg | ORAL_TABLET | Freq: Every day | ORAL | Status: DC

## 2011-01-12 MED ORDER — PRASUGREL HCL 10 MG PO TABS: 10.0000 mg | ORAL_TABLET | Freq: Every day | ORAL | Status: DC

## 2011-01-12 NOTE — Assessment & Plan Note (Signed)
Cholesterol is not at goal on his current medication regimen. We have suggested he try Crestor. He will try 20 mg for a week or so titrating to 40 mg daily. He may need zetia if he does not reach goal LDL less than 70.

## 2011-01-12 NOTE — Patient Instructions (Addendum)
STOP Plavix and Start Effient 10mg  once daily (as long as price ok) call office with any problems. Stop Simvastatin. Start Crestor 40mg  once daily.  Your physician recommends that you schedule a follow-up appointment in: 6 months

## 2011-01-12 NOTE — Assessment & Plan Note (Signed)
He reports adequate blood pressure at home. We have suggested he monitor his blood pressure closely and call for systolic pressures greater than 140.

## 2011-01-12 NOTE — Telephone Encounter (Signed)
Gave samples of effient 10 mg for one month supply.

## 2011-01-12 NOTE — Progress Notes (Signed)
Patient ID: [name], male    DOB: 25-Mar-1951, 60 y.o.   MRN: [id number]  HPI Comments: [name] is a 60 year old gentleman with a long history of coronary artery disease, bypass surgery in 1995, stenting at [facility] in February 2004 for MI with a 3.0 x 25 mm and 4.5 x 16 mm Monorail ( location uncertain), also 4.5 x 18 mm stent placed to the mid RCA, followup with repeat stenting in April 2005 to the proximal LAD with a Taxus stent 2.5 x 8 mm, and stenting to the proximal left circumflex with a 2.5 x 12 mm Taxus, with recent stent placed November 13, 2010 with a 4.0 x 9 mm stent placed to the left main (?) bili details are unavailable. He presents to establish care in the office.  He reports that since his discharge, he has had significant bilateral lower extremity discomfort, worse after sitting for long periods. He did have ABIs which showed no significant stenoses. He denies any further chest pain or shortness of breath.  He reports that he has been on simvastatin for at least a year. He used to be a smoker though stopped in 1995. Prior to that he smoked 3 packs per day  EKG shows normal sinus rhythm with rate 60 beats per minute with no significant ST or T wave changes         Outpatient Encounter Prescriptions as of 01/12/2011  Medication Sig Dispense Refill  . aspirin 325 MG tablet Take 325 mg by mouth daily.        . carvedilol (COREG) 3.125 MG tablet Take 1 tablet (3.125 mg total) by mouth 2 (two) times daily.  60 tablet  11  . ciprofloxacin (CIPRO) 500 MG tablet Take 1 tablet (500 mg total) by mouth 2 (two) times daily.  20 tablet  0  . clopidogrel (PLAVIX) 75 MG tablet Take 75 mg by mouth daily.        Marland Kitchen lisinopril (PRINIVIL,ZESTRIL) 5 MG tablet Take 5 mg by mouth daily.        . metroNIDAZOLE (FLAGYL) 500 MG tablet Take 1 tablet (500 mg total) by mouth 3 (three) times daily.  30 tablet  0  . mirtazapine (REMERON) 15 MG tablet Take 1 tablet (15 mg total) by mouth  at bedtime.  30 tablet  11  . naproxen sodium (ANAPROX) 220 MG tablet Take 220 mg by mouth as needed.        Marland Kitchen omeprazole (PRILOSEC) 20 MG capsule Take 20 mg by mouth daily.        .  simvastatin (ZOCOR) 40 MG tablet Take 1 tablet (40 mg total) by mouth at bedtime.  30 tablet  11  . calcium carbonate (TUMS EX) 750 MG chewable tablet Chew 1 tablet by mouth daily.        . nitroGLYCERIN (NITROSTAT) 0.4 MG SL tablet Place 0.4 mg under the tongue every 5 (five) minutes as needed.           Review of Systems  Constitutional: Negative.   HENT: Negative.   Eyes: Negative.   Respiratory: Negative.   Cardiovascular: Negative.   Gastrointestinal: Negative.   Musculoskeletal: Positive for back pain and gait problem.       Bilateral leg pain  Skin: Negative.   Neurological: Negative.   Hematological: Negative.   Psychiatric/Behavioral: Negative.   All other systems reviewed and are negative.    BP 164/97  Pulse 62  Wt 147 lb (66.679 kg)  Physical Exam  Nursing note and vitals reviewed. Constitutional: He is oriented to person, place, and time. He appears well-developed and well-nourished.  HENT:  Head: Normocephalic.  Nose: Nose normal.  Mouth/Throat: Oropharynx is clear and moist.  Eyes: Conjunctivae are normal. Pupils are equal, round, and reactive to light.  Neck: Normal range of motion. Neck supple. No JVD present.  Cardiovascular: Normal rate, regular rhythm, S1 normal, S2 normal, normal heart sounds and intact distal pulses.  Exam reveals no gallop and no friction rub.   No murmur heard. Pulmonary/Chest: Effort normal and breath sounds normal. No respiratory distress. He has no wheezes. He has no rales. He exhibits no tenderness.  Abdominal: Soft. Bowel sounds are normal. He exhibits no distension. There is no tenderness.  Musculoskeletal: Normal range of motion. He exhibits no edema and no tenderness.  Lymphadenopathy:    He has no cervical adenopathy.  Neurological: He is  alert and oriented to person, place, and time. Coordination normal.  Skin: Skin is warm and dry. No rash noted. No erythema.  Psychiatric: He has a normal mood and affect. His behavior is normal. Judgment and thought content normal.           Assessment and Plan

## 2011-01-12 NOTE — Assessment & Plan Note (Signed)
Etiology of his leg pain is concerning for worsening DJD, possible spinal stenosis. He reports that he has not had significant imaging of his back. It does not appear to be vascular.

## 2011-01-12 NOTE — Assessment & Plan Note (Signed)
Long history of severe coronary artery disease, previous history of smoking. We will try to obtain his most recent records for our review. The card given to him from the hospital suggests stent was placed in the left main. On concerned about him being on omeprazole and Plavix given the precarious location of the stent. Until we have further details, we have suggested he either change his proton pump inhibitor or change the Plavix to effient. He is reluctant to change his proton pump inhibitor as it works well. We will write a prescription for effient for at least the next several months and provide samples where we can. We have suggested he stay on aspirin 325 mg for now, for at least the next several months.

## 2011-01-14 ENCOUNTER — Telehealth: Payer: Self-pay | Admitting: *Deleted

## 2011-01-14 DIAGNOSIS — I719 Aortic aneurysm of unspecified site, without rupture: Secondary | ICD-10-CM

## 2011-01-14 NOTE — Telephone Encounter (Signed)
Pt and wife in office yesterday after pt's f/u with TG asking about an aneurysm that was found on his echo, they had forgotten to ask TG during visit. 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 Discussed w/ TG, per verbal order, will schedule an abdominal aortic u/s for further assessment. Notified pt if no additional changes found, with this measurement, TG will want to evaluate yearly to monitor. Pt and wife ok with this.  Asher Muir can you please call pt today to schedule at next available, thanks.

## 2011-02-09 ENCOUNTER — Encounter (INDEPENDENT_AMBULATORY_CARE_PROVIDER_SITE_OTHER): Payer: 59 | Admitting: *Deleted

## 2011-02-09 ENCOUNTER — Telehealth: Payer: Self-pay | Admitting: *Deleted

## 2011-02-09 DIAGNOSIS — I701 Atherosclerosis of renal artery: Secondary | ICD-10-CM

## 2011-02-09 DIAGNOSIS — I714 Abdominal aortic aneurysm, without rupture: Secondary | ICD-10-CM

## 2011-02-09 DIAGNOSIS — I719 Aortic aneurysm of unspecified site, without rupture: Secondary | ICD-10-CM

## 2011-02-09 NOTE — Telephone Encounter (Signed)
Notified pt's wife, per Dr. Mariah Milling, that AAA u/s today showed renal artery stenosis that will need further evaluation with renal artery duplex. She will notify pt, and procedure has been scheduled.

## 2011-02-17 ENCOUNTER — Encounter: Payer: Self-pay | Admitting: Cardiovascular Disease

## 2011-02-19 ENCOUNTER — Encounter: Payer: Self-pay | Admitting: Cardiovascular Disease

## 2011-02-19 ENCOUNTER — Encounter (INDEPENDENT_AMBULATORY_CARE_PROVIDER_SITE_OTHER): Payer: 59 | Admitting: *Deleted

## 2011-02-19 DIAGNOSIS — I701 Atherosclerosis of renal artery: Secondary | ICD-10-CM

## 2011-02-19 DIAGNOSIS — I10 Essential (primary) hypertension: Secondary | ICD-10-CM

## 2011-02-25 ENCOUNTER — Other Ambulatory Visit: Payer: Self-pay

## 2011-03-08 ENCOUNTER — Ambulatory Visit: Payer: 59 | Admitting: Internal Medicine

## 2011-03-09 ENCOUNTER — Institutional Professional Consult (permissible substitution): Payer: 59 | Admitting: Cardiovascular Disease

## 2011-03-31 ENCOUNTER — Ambulatory Visit: Payer: 59 | Admitting: Internal Medicine

## 2011-04-01 ENCOUNTER — Encounter: Payer: Self-pay | Admitting: *Deleted

## 2011-04-01 ENCOUNTER — Ambulatory Visit (INDEPENDENT_AMBULATORY_CARE_PROVIDER_SITE_OTHER): Payer: 59 | Admitting: Cardiovascular Disease

## 2011-04-01 ENCOUNTER — Encounter (HOSPITAL_COMMUNITY): Payer: Self-pay

## 2011-04-01 ENCOUNTER — Encounter: Payer: Self-pay | Admitting: Cardiovascular Disease

## 2011-04-01 VITALS — BP 138/80 | HR 58 | Ht 65.0 in | Wt 145.8 lb

## 2011-04-01 DIAGNOSIS — I701 Atherosclerosis of renal artery: Secondary | ICD-10-CM

## 2011-04-01 DIAGNOSIS — I10 Essential (primary) hypertension: Secondary | ICD-10-CM

## 2011-04-01 NOTE — Progress Notes (Signed)
History of Present Illness: [name] is a 60 year old gentleman here today to establish in PV clinic. His history includes coronary artery disease, bypass surgery in 1995, stenting at [facility] in February 2004 for MI with a 3.0 x 25 mm and 4.5 x 16 mm Monorail ( location uncertain), also 4.5 x 18 mm stent placed to the mid RCA, followup with repeat stenting in April 2005 to the proximal LAD with a Taxus stent 2.5 x 8 mm, and stenting to the proximal left circumflex with a 2.5 x 12 mm Taxus, with recent stent placed November 13, 2010 with a 4.0 x 9 mm stent placed to the left main.  His cardiac issues are followed by Dr. Mariah Milling in the [facility] office.   He was recently seen by Dr. Mariah Milling and had c/o significant bilateral lower extremity discomfort, worse after sitting for long periods. He had ABIs which showed no significant stenoses. Abdominal aortic u/s showed evidence of AAA and possible renal artery stenosis. Follow up dedicated renal artery u/s with moderate right renal artery stenosis and severe left renal artery stenosis. He feels well. No complaints today. Leg pain is much improved. He is here today with his wife and 2 grandkids.     Past Medical History  Diagnosis Date  . CAD (coronary artery disease)   . GERD (gastroesophageal reflux disease)   . Hyperlipidemia   . Hypertension   . Diverticulitis     1/06 Diverticulitis--CT of pelvis--difuse sigmoid divertic    Past Surgical History  Procedure Date  . Coronary artery bypass graft   . Closed reduction shoulder dislocation   . Angioplasty 1997  . Carotid stent     05/2002 IWMI with RCA stent, 4/05 LAD/LCX stents   EF-51%  . Coronary stent placement 7/12    2 stents--Promus element plus (everolimus eluting)--[facility] in [place]  . Cardiac catheterization     8/09  Cath--vein graft occlusions which are old--no acute changes  . Cardiac catheterization 11/13/2010    stent x 2 @ [place]    Current  Outpatient Prescriptions  Medication Sig Dispense Refill  . aspirin 325 MG tablet Take 325 mg by mouth daily.        . calcium carbonate (TUMS EX) 750 MG chewable tablet Chew 1 tablet by mouth daily.        . carvedilol (COREG) 3.125 MG tablet Take 1 tablet (3.125 mg total) by mouth 2 (two) times daily.  60 tablet  11  . lisinopril (PRINIVIL,ZESTRIL) 5 MG tablet Take 5 mg by mouth daily.        . naproxen sodium (ANAPROX) 220 MG tablet Take 220 mg by mouth as needed.        . nitroGLYCERIN (NITROSTAT) 0.4 MG SL tablet Place 0.4 mg under the tongue every 5 (five) minutes as needed.        Marland Kitchen omeprazole (PRILOSEC) 20 MG capsule Take 20 mg by mouth daily.        . prasugrel (EFFIENT) 10 MG TABS Take 1 tablet (10 mg total) by mouth daily.  30 tablet  6  . rosuvastatin (CRESTOR) 40 MG tablet Take 1 tablet (40 mg total) by mouth daily.  30 tablet  6    Allergies  Allergen Reactions  . Metoprolol Tartrate     REACTION: HIVES AND ITCHING    History   Social History  . Marital Status: Married    Spouse Name: N/A    Number of Children: 3  .  Years of Education: N/A   Occupational History  . Merchandiser, retail( Therapist, sports)    Social History Main Topics  . Smoking status: Former Smoker -- 3.0 packs/day    Types: Cigarettes    Quit date: 05/28/1993  . Smokeless tobacco: Never Used  . Alcohol Use: 7.2 oz/week    12 Cans of beer per week  . Drug Use: No  . Sexually Active: Not on file   Other Topics Concern  . Not on file   Social History Narrative  . No narrative on file    Family History  Problem Relation Age of Onset  . Heart disease Mother   . Hypertension Mother   . Heart disease Father   . Coronary artery disease Sister   . Coronary artery disease Brother   . Diabetes Neg Hx   . Cancer Neg Hx     prostate or colon    Review of Systems:  As stated in the HPI and otherwise negative.   BP 138/80  Pulse 58  Ht 5\' 5"  (1.651 m)  Wt 145 lb 12.8 oz (66.134 kg)   BMI 24.26 kg/m2  Physical Examination: General: Well developed, well nourished, NAD HEENT: OP clear, mucus membranes moist SKIN: warm, dry. No rashes. Neuro: No focal deficits Musculoskeletal: Muscle strength 5/5 all ext Psychiatric: Mood and affect normal Neck: No JVD, no carotid bruits, no thyromegaly, no lymphadenopathy. Lungs:Clear bilaterally, no wheezes, rhonci, crackles Cardiovascular: Regular rate and rhythm. No murmurs, gallops or rubs. Abdomen:Soft. Bowel sounds present. Non-tender.  Extremities: No lower extremity edema. Pulses are 2 + in the bilateral DP/PT.  Renal artery u/s: (02/19/11) <59% right renal artery stenosis, >60% critical left renal artery stenosis.

## 2011-04-01 NOTE — Patient Instructions (Signed)
Your physician recommends that you schedule a follow-up appointment in: 6 weeks.   Your physician recommends that you return for lab work on December 17th or 18th--BMP,CBC, PT- 440.1  Your physician has requested that you have a renal artery angiogram. This exam is performed at the hospital. During this exam IV contrast is used to look at arterial blood flow. Please review the information sheet given for details. Scheduled for April 16, 2011

## 2011-04-01 NOTE — Assessment & Plan Note (Signed)
Continue current meds. Plan renal artery angiogram as above.

## 2011-04-13 ENCOUNTER — Other Ambulatory Visit (INDEPENDENT_AMBULATORY_CARE_PROVIDER_SITE_OTHER): Payer: 59 | Admitting: *Deleted

## 2011-04-13 DIAGNOSIS — I701 Atherosclerosis of renal artery: Secondary | ICD-10-CM

## 2011-04-13 LAB — CBC WITH DIFFERENTIAL/PLATELET
Basophils Absolute: 0.1 10*3/uL (ref 0.0–0.1)
Eosinophils Relative: 4.5 % (ref 0.0–5.0)
HCT: 41.5 % (ref 39.0–52.0)
Hemoglobin: 14.3 g/dL (ref 13.0–17.0)
Lymphocytes Relative: 30.6 % (ref 12.0–46.0)
Lymphs Abs: 2.7 10*3/uL (ref 0.7–4.0)
Monocytes Relative: 11.8 % (ref 3.0–12.0)
Platelets: 320 10*3/uL (ref 150.0–400.0)
RDW: 13.9 % (ref 11.5–14.6)
WBC: 8.8 10*3/uL (ref 4.5–10.5)

## 2011-04-13 LAB — BASIC METABOLIC PANEL
CO2: 26 mEq/L (ref 19–32)
Calcium: 9.4 mg/dL (ref 8.4–10.5)
Chloride: 104 mEq/L (ref 96–112)
Glucose, Bld: 109 mg/dL — ABNORMAL HIGH (ref 70–99)
Sodium: 137 mEq/L (ref 135–145)

## 2011-04-13 LAB — PROTIME-INR
INR: 1 ratio (ref 0.8–1.0)
Prothrombin Time: 10.5 s (ref 10.2–12.4)

## 2011-04-16 ENCOUNTER — Other Ambulatory Visit: Payer: Self-pay

## 2011-04-16 ENCOUNTER — Ambulatory Visit (HOSPITAL_COMMUNITY)
Admission: RE | Admit: 2011-04-16 | Discharge: 2011-04-16 | Disposition: A | Discharge status: Home / Self Care | Payer: 59 | Source: Ambulatory Visit | Attending: Cardiovascular Disease | Admitting: Cardiovascular Disease

## 2011-04-16 ENCOUNTER — Encounter (HOSPITAL_COMMUNITY)
Admission: RE | Disposition: A | Discharge status: Home / Self Care | Payer: Self-pay | Source: Ambulatory Visit | Attending: Cardiovascular Disease

## 2011-04-16 DIAGNOSIS — I701 Atherosclerosis of renal artery: Secondary | ICD-10-CM

## 2011-04-16 DIAGNOSIS — I150 Renovascular hypertension: Secondary | ICD-10-CM | POA: Insufficient documentation

## 2011-04-16 HISTORY — PX: RENAL ANGIOGRAM: SHX5509

## 2011-04-16 LAB — POCT ACTIVATED CLOTTING TIME
Activated Clotting Time: 243 seconds
Activated Clotting Time: 171 seconds

## 2011-04-16 SURGERY — RENAL ANGIOGRAM
Anesthesia: LOCAL

## 2011-04-16 MED ORDER — SODIUM CHLORIDE 0.9 % IJ SOLN: 3.0000 mL | Freq: Two times a day (BID) | INTRAMUSCULAR | Status: DC

## 2011-04-16 MED ORDER — MIDAZOLAM HCL 2 MG/2ML IJ SOLN
INTRAMUSCULAR | Status: AC
  Filled 2011-04-16: qty 2

## 2011-04-16 MED ORDER — ASPIRIN 81 MG PO CHEW
324.0000 mg | CHEWABLE_TABLET | ORAL | Status: AC
  Administered 2011-04-16: 324 mg via ORAL
  Filled 2011-04-16: qty 4

## 2011-04-16 MED ORDER — FENTANYL CITRATE 0.05 MG/ML IJ SOLN
INTRAMUSCULAR | Status: AC
  Filled 2011-04-16: qty 2

## 2011-04-16 MED ORDER — HEPARIN (PORCINE) IN NACL 2-0.9 UNIT/ML-% IJ SOLN
INTRAMUSCULAR | Status: AC
  Filled 2011-04-16: qty 1000

## 2011-04-16 MED ORDER — SODIUM CHLORIDE 0.9 % IV SOLN: INTRAVENOUS | Status: DC

## 2011-04-16 MED ORDER — HEPARIN SODIUM (PORCINE) 1000 UNIT/ML IJ SOLN
INTRAMUSCULAR | Status: AC
  Filled 2011-04-16: qty 1

## 2011-04-16 MED ORDER — SODIUM CHLORIDE 0.9 % IV SOLN
250.0000 mL | INTRAVENOUS | Status: DC | PRN
  Administered 2011-04-16: 1000 mL via INTRAVENOUS

## 2011-04-16 MED ORDER — SODIUM CHLORIDE 0.9 % IJ SOLN: 3.0000 mL | INTRAMUSCULAR | Status: DC | PRN

## 2011-04-16 MED ORDER — DIAZEPAM 5 MG PO TABS
5.0000 mg | ORAL_TABLET | ORAL | Status: AC
  Administered 2011-04-16: 5 mg via ORAL
  Filled 2011-04-16: qty 1

## 2011-04-16 MED ORDER — ACETAMINOPHEN 325 MG PO TABS: 650.0000 mg | ORAL_TABLET | ORAL | Status: DC | PRN

## 2011-04-16 MED ORDER — ONDANSETRON HCL 4 MG/2ML IJ SOLN: 4.0000 mg | Freq: Four times a day (QID) | INTRAMUSCULAR | Status: DC | PRN

## 2011-04-16 MED ORDER — LIDOCAINE HCL (PF) 1 % IJ SOLN
INTRAMUSCULAR | Status: AC
  Filled 2011-04-16: qty 30

## 2011-04-16 NOTE — Interval H&P Note (Signed)
History and Physical Interval Note:  04/16/2011 7:33 AM  [name]  has presented today for  A distal aortogram and renal artery angiogram.  with the diagnosis of renal artery Stenosis  The various methods of treatment have been discussed with the patient and family. After consideration of risks, benefits and other options for treatment, the patient has consented to  Procedure(s): RENAL ANGIOGRAM as a surgical intervention .  The patients' history has been reviewed, patient examined, no change in status, stable for surgery.  I have reviewed the patients' chart and labs.  Questions were answered to the patient's satisfaction.     Hance Caspers

## 2011-04-16 NOTE — Op Note (Signed)
Peripheral Vascular Operative Report  [name] [id number] 12/21/20128:12 AM [name], MD, MD  Procedure Performed:  1. Distal aortogram 2. Selective left renal artery angiography 3. PTA with placement of a stent in the left renal artery.  Operator: Verne Carrow, MD  Indication:   Severe left renal artery stenosis in a patient with HTN.                                     Procedure Details: The risks, benefits, complications, treatment options, and expected outcomes were discussed with the patient. The patient and/or family concurred with the proposed plan, giving informed consent. The patient was brought to the cath lab after IV hydration was begun and oral premedication was given. The patient was further sedated with Versed and Fentanyl. The right groin was prepped and draped in the usual manner. Using the modified Seldinger access technique, a 6 French sheath was placed in the right femoral artery. A pigtail catheter was used to perform a distal aortogram. I then selectively engaged the left renal artery with a IM guiding catheter. 5000 Units IV heparin was given. He has been on ASA and Effient at home. A Spartacore wire was used to wire the left renal artery. When the ACT was greater than 200, I deployed a 5.0 x 12 mm Herculink elite stent in the proximal left renal artery. A 6.0 x 15 mm balloon was used to post-dilate the stent. There was an excellent angiographic result with good distal flow.   There were no immediate complications. The patient was taken to the recovery area in stable condition.   Hemodynamic Findings: Central aortic pressure: 126/73  Angiographic Findings:  The distal aorta has a small aneurysm.  The left renal artery has a 99% stenosis.   The right renal artery has a 30% proximal stenosis.    Impression:  1. Severe left renal artery stenosis 2. Successful PTA/stent placement left renal artery.  3. Mild plaque right renal artery 4. Small  AAA  Recommendations: Continue ASA and Effient for at least one month.        Complications:  None. The patient tolerated the procedure well.

## 2011-04-16 NOTE — H&P (View-Only) (Signed)
 History of Present Illness: [name] is a 60-year-old gentleman here today to establish in PV clinic. His history includes coronary artery disease, bypass surgery in 1995, stenting at [facility] in February 2004 for MI with a 3.0 x 25 mm and 4.5 x 16 mm Monorail ( location uncertain), also 4.5 x 18 mm stent placed to the mid RCA, followup with repeat stenting in April 2005 to the proximal LAD with a Taxus stent 2.5 x 8 mm, and stenting to the proximal left circumflex with a 2.5 x 12 mm Taxus, with recent stent placed November 13, 2010 with a 4.0 x 9 mm stent placed to the left main.  His cardiac issues are followed by Dr. Gollan in the [place] office.   He was recently seen by Dr. Gollan and had c/o significant bilateral lower extremity discomfort, worse after sitting for long periods. He had ABIs which showed no significant stenoses. Abdominal aortic u/s showed evidence of AAA and possible renal artery stenosis. Follow up dedicated renal artery u/s with moderate right renal artery stenosis and severe left renal artery stenosis. He feels well. No complaints today. Leg pain is much improved. He is here today with his wife and 2 grandkids.     Past Medical History  Diagnosis Date  . CAD (coronary artery disease)   . GERD (gastroesophageal reflux disease)   . Hyperlipidemia   . Hypertension   . Diverticulitis     1/06 Diverticulitis--CT of pelvis--difuse sigmoid divertic    Past Surgical History  Procedure Date  . Coronary artery bypass graft   . Closed reduction shoulder dislocation   . Angioplasty 1997  . Carotid stent     05/2002 IWMI with RCA stent, 4/05 LAD/LCX stents   EF-51%  . Coronary stent placement 7/12    2 stents--Promus element plus (everolimus eluting)--[facility] in [place]  . Cardiac catheterization     8/09  Cath--vein graft occlusions which are old--no acute changes  . Cardiac catheterization 11/13/2010    stent x 2 @ [place]    Current  Outpatient Prescriptions  Medication Sig Dispense Refill  . aspirin 325 MG tablet Take 325 mg by mouth daily.        . calcium carbonate (TUMS EX) 750 MG chewable tablet Chew 1 tablet by mouth daily.        . carvedilol (COREG) 3.125 MG tablet Take 1 tablet (3.125 mg total) by mouth 2 (two) times daily.  60 tablet  11  . lisinopril (PRINIVIL,ZESTRIL) 5 MG tablet Take 5 mg by mouth daily.        . naproxen sodium (ANAPROX) 220 MG tablet Take 220 mg by mouth as needed.        . nitroGLYCERIN (NITROSTAT) 0.4 MG SL tablet Place 0.4 mg under the tongue every 5 (five) minutes as needed.        . omeprazole (PRILOSEC) 20 MG capsule Take 20 mg by mouth daily.        . prasugrel (EFFIENT) 10 MG TABS Take 1 tablet (10 mg total) by mouth daily.  30 tablet  6  . rosuvastatin (CRESTOR) 40 MG tablet Take 1 tablet (40 mg total) by mouth daily.  30 tablet  6    Allergies  Allergen Reactions  . Metoprolol Tartrate     REACTION: HIVES AND ITCHING    History   Social History  . Marital Status: Married    Spouse Name: N/A    Number of Children: 3  .   Years of Education: N/A   Occupational History  . service engineer( wood glueing machinery)    Social History Main Topics  . Smoking status: Former Smoker -- 3.0 packs/day    Types: Cigarettes    Quit date: 05/28/1993  . Smokeless tobacco: Never Used  . Alcohol Use: 7.2 oz/week    12 Cans of beer per week  . Drug Use: No  . Sexually Active: Not on file   Other Topics Concern  . Not on file   Social History Narrative  . No narrative on file    Family History  Problem Relation Age of Onset  . Heart disease Mother   . Hypertension Mother   . Heart disease Father   . Coronary artery disease Sister   . Coronary artery disease Brother   . Diabetes Neg Hx   . Cancer Neg Hx     prostate or colon    Review of Systems:  As stated in the HPI and otherwise negative.   BP 138/80  Pulse 58  Ht 5' 5" (1.651 m)  Wt 145 lb 12.8 oz (66.134 kg)   BMI 24.26 kg/m2  Physical Examination: General: Well developed, well nourished, NAD HEENT: OP clear, mucus membranes moist SKIN: warm, dry. No rashes. Neuro: No focal deficits Musculoskeletal: Muscle strength 5/5 all ext Psychiatric: Mood and affect normal Neck: No JVD, no carotid bruits, no thyromegaly, no lymphadenopathy. Lungs:Clear bilaterally, no wheezes, rhonci, crackles Cardiovascular: Regular rate and rhythm. No murmurs, gallops or rubs. Abdomen:Soft. Bowel sounds present. Non-tender.  Extremities: No lower extremity edema. Pulses are 2 + in the bilateral DP/PT.  Renal artery u/s: (02/19/11) <59% right renal artery stenosis, >60% critical left renal artery stenosis.  

## 2011-05-21 ENCOUNTER — Ambulatory Visit: Payer: 59 | Admitting: Cardiovascular Disease

## 2011-05-24 ENCOUNTER — Telehealth: Payer: Self-pay

## 2011-05-24 MED ORDER — LISINOPRIL 5 MG PO TABS: 5.0000 mg | ORAL_TABLET | Freq: Every day | ORAL | Status: DC

## 2011-05-24 NOTE — Telephone Encounter (Signed)
Refill sent lisinopril 5 mg take one tablet daily.

## 2011-06-11 ENCOUNTER — Other Ambulatory Visit: Payer: Self-pay | Admitting: *Deleted

## 2011-06-11 DIAGNOSIS — I701 Atherosclerosis of renal artery: Secondary | ICD-10-CM

## 2011-06-14 ENCOUNTER — Encounter: Payer: 59 | Admitting: Cardiology

## 2011-06-28 ENCOUNTER — Telehealth: Payer: Self-pay | Admitting: *Deleted

## 2011-06-28 NOTE — Telephone Encounter (Signed)
Bernadette from [facility] which [facility] called to verify crestor dose. Cherly Hensen gave verbal open approval for crestor. I will call pharmacy and pt to let them both know approved. Danielle Rankin Pharmacist aware of open approval today. Danielle Rankin

## 2011-09-21 ENCOUNTER — Other Ambulatory Visit: Payer: Self-pay | Admitting: *Deleted

## 2011-09-21 MED ORDER — PRASUGREL HCL 10 MG PO TABS: 10.0000 mg | ORAL_TABLET | Freq: Every day | ORAL | Status: DC

## 2011-10-04 ENCOUNTER — Other Ambulatory Visit: Payer: Self-pay | Admitting: *Deleted

## 2011-10-04 MED ORDER — CARVEDILOL 3.125 MG PO TABS: 3.1250 mg | ORAL_TABLET | Freq: Two times a day (BID) | ORAL | Status: DC

## 2011-10-08 ENCOUNTER — Other Ambulatory Visit (HOSPITAL_COMMUNITY): Payer: Self-pay

## 2011-10-08 MED ORDER — ROSUVASTATIN CALCIUM 40 MG PO TABS: 40.0000 mg | ORAL_TABLET | Freq: Every day | ORAL | Status: DC

## 2011-10-08 NOTE — Telephone Encounter (Signed)
..   Requested Prescriptions   Signed Prescriptions Disp Refills  . rosuvastatin (CRESTOR) 40 MG tablet 30 tablet 3    Sig: Take 1 tablet (40 mg total) by mouth daily.    Authorizing Provider: Verne Carrow D    Ordering User: Darrin Koman M  patient needs to call office to make appointment

## 2011-12-07 ENCOUNTER — Other Ambulatory Visit: Payer: Self-pay | Admitting: *Deleted

## 2011-12-07 MED ORDER — CARVEDILOL 3.125 MG PO TABS: 3.1250 mg | ORAL_TABLET | Freq: Two times a day (BID) | ORAL | Status: DC

## 2011-12-07 NOTE — Telephone Encounter (Signed)
Received faxed refill request from pharmacy. Refill sent to pharmacy electronically. Patient has follow-up appointment scheduled 12/14/11 with Dr. Alphonsus Sias.

## 2011-12-14 ENCOUNTER — Encounter: Payer: Self-pay | Admitting: Internal Medicine

## 2011-12-14 ENCOUNTER — Ambulatory Visit (INDEPENDENT_AMBULATORY_CARE_PROVIDER_SITE_OTHER): Payer: PRIVATE HEALTH INSURANCE | Admitting: Internal Medicine

## 2011-12-14 VITALS — BP 138/80 | HR 64 | Temp 97.8°F | Ht 65.0 in | Wt 145.0 lb

## 2011-12-14 DIAGNOSIS — I10 Essential (primary) hypertension: Secondary | ICD-10-CM

## 2011-12-14 DIAGNOSIS — E785 Hyperlipidemia, unspecified: Secondary | ICD-10-CM

## 2011-12-14 DIAGNOSIS — G479 Sleep disorder, unspecified: Secondary | ICD-10-CM

## 2011-12-14 LAB — CBC WITH DIFFERENTIAL/PLATELET
Basophils Absolute: 0.1 10*3/uL (ref 0.0–0.1)
Eosinophils Absolute: 0.2 10*3/uL (ref 0.0–0.7)
Hemoglobin: 14.8 g/dL (ref 13.0–17.0)
Lymphocytes Relative: 30.3 % (ref 12.0–46.0)
Lymphs Abs: 2.5 10*3/uL (ref 0.7–4.0)
MCHC: 33.2 g/dL (ref 30.0–36.0)
Neutro Abs: 4.7 10*3/uL (ref 1.4–7.7)
Platelets: 265 10*3/uL (ref 150.0–400.0)
RDW: 14.9 % — ABNORMAL HIGH (ref 11.5–14.6)

## 2011-12-14 LAB — BASIC METABOLIC PANEL
BUN: 8 mg/dL (ref 6–23)
CO2: 27 mEq/L (ref 19–32)
Calcium: 9.6 mg/dL (ref 8.4–10.5)
Glucose, Bld: 90 mg/dL (ref 70–99)
Sodium: 133 mEq/L — ABNORMAL LOW (ref 135–145)

## 2011-12-14 LAB — LIPID PANEL
Cholesterol: 209 mg/dL — ABNORMAL HIGH (ref 0–200)
HDL: 49.9 mg/dL (ref >39.00)
Triglycerides: 258 mg/dL — ABNORMAL HIGH (ref 0.0–149.0)

## 2011-12-14 LAB — HEPATIC FUNCTION PANEL
Albumin: 4.1 g/dL (ref 3.5–5.2)
Alkaline Phosphatase: 47 U/L (ref 39–117)
Total Bilirubin: 1 mg/dL (ref 0.3–1.2)

## 2011-12-14 LAB — LDL CHOLESTEROL, DIRECT: Direct LDL: 127.1 mg/dL

## 2011-12-14 NOTE — Assessment & Plan Note (Signed)
Uses the mirtazapine prn----not typical but still acceptable

## 2011-12-14 NOTE — Assessment & Plan Note (Signed)
No problems with statin Due for labs 

## 2011-12-14 NOTE — Progress Notes (Signed)
Subjective:    Patient ID: [name], male    DOB: 01-27-1951, 61 y.o.   MRN: [id number]  HPI Doing fine Working but has been limited Will be going to [place] next week for 4-5 days  No heart problems Not much exercise--he does walk  Sleeps is generally fair At times he is anxious and he can tell he won't sleep Then he can use the mirtazapine and it will help Probably uses it 1-2 times per week  Leg pain is better Had renal artery angiogram done in December---stent placed  No chest pain No SOB but does have DOE if he really pushes it Has noted some dizziness if he bends over and quickly gets up in the hot weather No syncope  Current Outpatient Prescriptions on File Prior to Visit  Medication Sig Dispense Refill  . aspirin 325 MG tablet Take 325 mg by mouth daily.       . calcium carbonate (TUMS EX) 750 MG chewable tablet Chew 1 tablet by mouth daily as needed. indigestion      . carvedilol (COREG) 3.125 MG tablet Take 1 tablet (3.125 mg total) by mouth 2 (two) times daily.  60 tablet  0  . lisinopril (PRINIVIL,ZESTRIL) 5 MG tablet Take 1 tablet (5 mg total) by mouth daily.  30 tablet  6  . mirtazapine (REMERON) 15 MG tablet Take 15 mg by mouth at bedtime.       . naproxen sodium (ANAPROX) 220 MG tablet Take 220 mg by mouth as needed.        . nitroGLYCERIN (NITROSTAT) 0.4 MG SL tablet Place 0.4 mg under the tongue every 5 (five) minutes as needed. For chest pain      . omeprazole (PRILOSEC) 20 MG capsule Take 20 mg by mouth daily.       . prasugrel (EFFIENT) 10 MG TABS Take 1 tablet (10 mg total) by mouth daily.  30 tablet  6  . rosuvastatin (CRESTOR) 40 MG tablet Take 1 tablet (40 mg total) by mouth daily.  30 tablet  3  . DISCONTD: mirtazapine (REMERON) 15 MG tablet Take 1 tablet (15 mg total) by mouth at bedtime.  30 tablet  11    Allergies  Allergen Reactions  . Metoprolol Tartrate     REACTION: HIVES AND ITCHING    Past Medical History  Diagnosis Date  . CAD  (coronary artery disease)   . GERD (gastroesophageal reflux disease)   . Hyperlipidemia   . Hypertension   . Diverticulitis     1/06 Diverticulitis--CT of pelvis--difuse sigmoid divertic  . AAA (abdominal aortic aneurysm)   . Renal artery stenosis     Past Surgical History  Procedure Date  . Coronary artery bypass graft   . Closed reduction shoulder dislocation   . Angioplasty 1997  . Coronary stent placement 7/12    2 stents--Promus element plus (everolimus eluting)--[facility] in [place]  . Cardiac catheterization     8/09  Cath--vein graft occlusions which are old--no acute changes  . Cardiac catheterization 11/13/2010    stent x 2 @ [place]    Family History  Problem Relation Age of Onset  . Coronary artery disease Mother     Died MI age 71  . Hypertension Mother   . Coronary artery disease Sister   . Coronary artery disease Brother   . Diabetes Neg Hx   . Cancer Neg Hx     prostate or colon  . Coronary artery disease Father  Died MI age 69    History   Social History  . Marital Status: Married    Spouse Name: N/A    Number of Children: 3  . Years of Education: N/A   Occupational History  . Merchandiser, retail( Therapist, sports)    Social History Main Topics  . Smoking status: Former Smoker -- 3.0 packs/day for 25 years    Types: Cigarettes    Quit date: 05/28/1993  . Smokeless tobacco: Never Used  . Alcohol Use: 7.2 oz/week    12 Cans of beer per week  . Drug Use: No  . Sexually Active: Not on file   Other Topics Concern  . Not on file   Social History Narrative  . No narrative on file   Review of Systems Appetite is not great--skips meals at times. Wife has him drink ensure Weight stable     Objective:   Physical Exam  Constitutional: He appears well-developed and well-nourished. No distress.  Neck: Normal range of motion. Neck supple. No thyromegaly present.  Cardiovascular: Normal rate, regular rhythm and normal heart  sounds.  Exam reveals no gallop.   No murmur heard. Pulmonary/Chest: Effort normal and breath sounds normal. No respiratory distress. He has no wheezes. He has no rales.  Musculoskeletal: He exhibits no edema and no tenderness.  Lymphadenopathy:    He has no cervical adenopathy.  Psychiatric: He has a normal mood and affect. His behavior is normal. Thought content normal.          Assessment & Plan:

## 2011-12-14 NOTE — Patient Instructions (Signed)
Please consider doing the stool test to look for blood as a colon cancer screening test. If you are willing to do this, please call and we will set it up

## 2011-12-14 NOTE — Assessment & Plan Note (Signed)
BP Readings from Last 3 Encounters:  12/14/11 138/80  04/16/11 135/80  04/16/11 135/80   Good control No changes needed

## 2011-12-16 ENCOUNTER — Encounter: Payer: Self-pay | Admitting: *Deleted

## 2012-01-03 ENCOUNTER — Encounter (HOSPITAL_COMMUNITY): Payer: Self-pay | Admitting: Family Medicine

## 2012-01-03 ENCOUNTER — Inpatient Hospital Stay (HOSPITAL_COMMUNITY)
Admission: EM | Admit: 2012-01-03 | Discharge: 2012-01-04 | DRG: 287 | Disposition: A | Discharge status: Home / Self Care | Payer: PRIVATE HEALTH INSURANCE | Attending: Cardiology | Admitting: Cardiology

## 2012-01-03 ENCOUNTER — Emergency Department (HOSPITAL_COMMUNITY): Payer: PRIVATE HEALTH INSURANCE

## 2012-01-03 DIAGNOSIS — I25810 Atherosclerosis of coronary artery bypass graft(s) without angina pectoris: Secondary | ICD-10-CM | POA: Diagnosis present

## 2012-01-03 DIAGNOSIS — E785 Hyperlipidemia, unspecified: Secondary | ICD-10-CM | POA: Diagnosis present

## 2012-01-03 DIAGNOSIS — I2510 Atherosclerotic heart disease of native coronary artery without angina pectoris: Principal | ICD-10-CM | POA: Diagnosis present

## 2012-01-03 DIAGNOSIS — Z79899 Other long term (current) drug therapy: Secondary | ICD-10-CM

## 2012-01-03 DIAGNOSIS — I714 Abdominal aortic aneurysm, without rupture, unspecified: Secondary | ICD-10-CM | POA: Diagnosis present

## 2012-01-03 DIAGNOSIS — I200 Unstable angina: Secondary | ICD-10-CM | POA: Diagnosis present

## 2012-01-03 DIAGNOSIS — I25119 Atherosclerotic heart disease of native coronary artery with unspecified angina pectoris: Secondary | ICD-10-CM | POA: Diagnosis present

## 2012-01-03 DIAGNOSIS — Z87891 Personal history of nicotine dependence: Secondary | ICD-10-CM

## 2012-01-03 DIAGNOSIS — R079 Chest pain, unspecified: Secondary | ICD-10-CM

## 2012-01-03 DIAGNOSIS — I701 Atherosclerosis of renal artery: Secondary | ICD-10-CM | POA: Diagnosis present

## 2012-01-03 DIAGNOSIS — I10 Essential (primary) hypertension: Secondary | ICD-10-CM | POA: Diagnosis present

## 2012-01-03 DIAGNOSIS — Z7982 Long term (current) use of aspirin: Secondary | ICD-10-CM

## 2012-01-03 DIAGNOSIS — K219 Gastro-esophageal reflux disease without esophagitis: Secondary | ICD-10-CM | POA: Diagnosis present

## 2012-01-03 LAB — COMPREHENSIVE METABOLIC PANEL
ALT: 22 U/L (ref 0–53)
AST: 24 U/L (ref 0–37)
Alkaline Phosphatase: 52 U/L (ref 39–117)
CO2: 26 mEq/L (ref 19–32)
Chloride: 96 mEq/L (ref 96–112)
GFR calc non Af Amer: >90 mL/min (ref >90)
Potassium: 4 mEq/L (ref 3.5–5.1)
Sodium: 133 mEq/L — ABNORMAL LOW (ref 135–145)
Total Bilirubin: 0.3 mg/dL (ref 0.3–1.2)

## 2012-01-03 LAB — CBC WITH DIFFERENTIAL/PLATELET
Hemoglobin: 14.5 g/dL (ref 13.0–17.0)
Lymphs Abs: 2.1 10*3/uL (ref 0.7–4.0)
Monocytes Relative: 7 % (ref 3–12)
Neutro Abs: 3.5 10*3/uL (ref 1.7–7.7)
Neutrophils Relative %: 56 % (ref 43–77)
RBC: 4.43 MIL/uL (ref 4.22–5.81)

## 2012-01-03 LAB — CK TOTAL AND CKMB (NOT AT ARMC)
CK, MB: 2.2 ng/mL (ref 0.3–4.0)
Relative Index: INVALID (ref 0.0–2.5)
Total CK: 86 U/L (ref 7–232)

## 2012-01-03 LAB — POCT I-STAT TROPONIN I

## 2012-01-03 LAB — TROPONIN I: Troponin I: <0.3 ng/mL (ref <0.30)

## 2012-01-03 LAB — PROTIME-INR: INR: 0.97 (ref 0.00–1.49)

## 2012-01-03 MED ORDER — NITROGLYCERIN 0.4 MG SL SUBL: 0.4000 mg | SUBLINGUAL_TABLET | SUBLINGUAL | Status: DC | PRN

## 2012-01-03 MED ORDER — HEPARIN BOLUS VIA INFUSION
3500.0000 [IU] | Freq: Once | INTRAVENOUS | Status: AC
  Administered 2012-01-03: 3500 [IU] via INTRAVENOUS

## 2012-01-03 MED ORDER — ONDANSETRON HCL 4 MG/2ML IJ SOLN
4.0000 mg | Freq: Once | INTRAMUSCULAR | Status: AC
  Administered 2012-01-03: 4 mg via INTRAVENOUS
  Filled 2012-01-03: qty 2

## 2012-01-03 MED ORDER — PRASUGREL HCL 10 MG PO TABS
10.0000 mg | ORAL_TABLET | Freq: Every day | ORAL | Status: DC
  Administered 2012-01-04: 10 mg via ORAL
  Filled 2012-01-03: qty 1

## 2012-01-03 MED ORDER — ACETAMINOPHEN 325 MG PO TABS: 650.0000 mg | ORAL_TABLET | ORAL | Status: DC | PRN

## 2012-01-03 MED ORDER — HEPARIN (PORCINE) IN NACL 100-0.45 UNIT/ML-% IJ SOLN
1100.0000 [IU]/h | INTRAMUSCULAR | Status: DC
  Administered 2012-01-03: 800 [IU]/h via INTRAVENOUS
  Filled 2012-01-03 (×3): qty 250

## 2012-01-03 MED ORDER — SODIUM CHLORIDE 0.9 % IV SOLN
1.0000 mL/kg/h | INTRAVENOUS | Status: DC
  Administered 2012-01-04 (×2): 0.973 mL/kg/h via INTRAVENOUS

## 2012-01-03 MED ORDER — ASPIRIN EC 81 MG PO TBEC
81.0000 mg | DELAYED_RELEASE_TABLET | Freq: Every day | ORAL | Status: DC
  Filled 2012-01-03: qty 1

## 2012-01-03 MED ORDER — SODIUM CHLORIDE 0.9 % IJ SOLN: 3.0000 mL | INTRAMUSCULAR | Status: DC | PRN

## 2012-01-03 MED ORDER — SODIUM CHLORIDE 0.9 % IJ SOLN
3.0000 mL | Freq: Two times a day (BID) | INTRAMUSCULAR | Status: DC
  Administered 2012-01-03: 3 mL via INTRAVENOUS

## 2012-01-03 MED ORDER — ASPIRIN 81 MG PO CHEW
324.0000 mg | CHEWABLE_TABLET | ORAL | Status: AC
  Administered 2012-01-04: 324 mg via ORAL
  Filled 2012-01-03: qty 4

## 2012-01-03 MED ORDER — ASPIRIN 81 MG PO CHEW
324.0000 mg | CHEWABLE_TABLET | Freq: Once | ORAL | Status: AC
  Administered 2012-01-03: 324 mg via ORAL
  Filled 2012-01-03: qty 4

## 2012-01-03 MED ORDER — LISINOPRIL 5 MG PO TABS
5.0000 mg | ORAL_TABLET | Freq: Every day | ORAL | Status: DC
  Administered 2012-01-04: 5 mg via ORAL
  Filled 2012-01-03: qty 1

## 2012-01-03 MED ORDER — MIRTAZAPINE 15 MG PO TABS
15.0000 mg | ORAL_TABLET | Freq: Every evening | ORAL | Status: DC | PRN
  Administered 2012-01-03: 15 mg via ORAL
  Filled 2012-01-03: qty 1

## 2012-01-03 MED ORDER — PANTOPRAZOLE SODIUM 40 MG PO TBEC
40.0000 mg | DELAYED_RELEASE_TABLET | Freq: Every day | ORAL | Status: DC
  Administered 2012-01-04: 40 mg via ORAL
  Filled 2012-01-03: qty 1

## 2012-01-03 MED ORDER — ASPIRIN 81 MG PO CHEW: 324.0000 mg | CHEWABLE_TABLET | Freq: Once | ORAL | Status: DC

## 2012-01-03 MED ORDER — MORPHINE SULFATE 2 MG/ML IJ SOLN
2.0000 mg | Freq: Once | INTRAMUSCULAR | Status: AC
  Administered 2012-01-03: 2 mg via INTRAVENOUS
  Filled 2012-01-03: qty 1

## 2012-01-03 MED ORDER — MORPHINE SULFATE 2 MG/ML IJ SOLN
2.0000 mg | INTRAMUSCULAR | Status: DC | PRN
  Administered 2012-01-03 – 2012-01-04 (×2): 2 mg via INTRAVENOUS
  Filled 2012-01-03: qty 1

## 2012-01-03 MED ORDER — ATORVASTATIN CALCIUM 80 MG PO TABS
80.0000 mg | ORAL_TABLET | Freq: Every day | ORAL | Status: DC
  Filled 2012-01-03 (×2): qty 1

## 2012-01-03 MED ORDER — ONDANSETRON HCL 4 MG/2ML IJ SOLN: 4.0000 mg | Freq: Four times a day (QID) | INTRAMUSCULAR | Status: DC | PRN

## 2012-01-03 MED ORDER — NITROGLYCERIN IN D5W 200-5 MCG/ML-% IV SOLN
INTRAVENOUS | Status: AC
  Administered 2012-01-03: 5 ug/min via INTRAVENOUS
  Filled 2012-01-03: qty 250

## 2012-01-03 MED ORDER — CARVEDILOL 3.125 MG PO TABS
3.1250 mg | ORAL_TABLET | Freq: Two times a day (BID) | ORAL | Status: DC
  Filled 2012-01-03 (×3): qty 1

## 2012-01-03 MED ORDER — SODIUM CHLORIDE 0.9 % IV SOLN
250.0000 mL | INTRAVENOUS | Status: DC | PRN
  Administered 2012-01-03: 22:00:00 via INTRAVENOUS

## 2012-01-03 MED ORDER — NITROGLYCERIN IN D5W 200-5 MCG/ML-% IV SOLN
2.0000 ug/min | INTRAVENOUS | Status: DC
  Administered 2012-01-03: 5 ug/min via INTRAVENOUS

## 2012-01-03 MED ORDER — MORPHINE SULFATE 2 MG/ML IJ SOLN
INTRAMUSCULAR | Status: AC
  Filled 2012-01-03: qty 1

## 2012-01-03 NOTE — ED Notes (Addendum)
Pt arrived in room from xray

## 2012-01-03 NOTE — H&P (Signed)
Patient ID: [name] MRN: [id number], DOB/AGE: 1950/05/02   Admit date: 01/03/2012  Primary Physician: [name], MD Primary Cardiologist: Concha Se, MD  Pt. Profile:  61 y/o male with h/o CAD s/p CABG with multiple interventions since, who presents w/ recurrent chest pain.   Problem List  Past Medical History  Diagnosis Date  . CAD (coronary artery disease)     a. 1995 s/p CABG x 4: LIMA->LAD, VG->RI (known to be occluded), VG->AM->PDA;  b. 05/2002 Inf STEMI: VG->AM->PDA 100% treated w/ 2 BMS complicated by acute thrombosis req 3 BMS;  c. 07/2003 DES to native LAD & LCX (VG's to PDA & RI 100%);  12/2010 Acute MI [facility]): DES to LCX & LM , LIMA ok, VG's 100%.  Marland Kitchen GERD (gastroesophageal reflux disease)   . Hyperlipidemia   . Hypertension   . Diverticulitis     1/06 Diverticulitis--CT of pelvis--difuse sigmoid divertic  . AAA (abdominal aortic aneurysm)     a. Abd U/S 01/2011: 3.2x3.2 cm.  . Renal artery stenosis     a. 03/2011 PTA and stenting of L RA.    Past Surgical History  Procedure Date  . Coronary artery bypass graft   . Closed reduction shoulder dislocation   . Angioplasty 1997  . Coronary stent placement 7/12    2 stents--Promus element plus (everolimus eluting)--[facility] in [place]  . Cardiac catheterization     8/09  Cath--vein graft occlusions which are old--no acute changes  . Cardiac catheterization 11/13/2010    stent x 2 @ [place]     Allergies  Allergies  Allergen Reactions  . Metoprolol Tartrate Hives and Itching    REACTION: HIVES AND ITCHING    HPI  Symptoms began this AM after waking including nausea and "fogginess"/lightheadedness and substernal chest pain, ~ 1-2/10, +Lavine's sign. 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 Patient also felt pain shooting across his shoulders and a "heavy" left arm. Patient tried to  eat cereal but nausea worsened and symptoms continued to progress. Pt then reported to ED via car with his wife. Pt took NTG x2 in transit which helped pain decrease from its worse (5/10) to about 3/10. Pt currently reports currently having 3/10 substernal chest pain. Denies current SOB, nausea, lightheadedness. Reports having 1-2/10 chest pain about 3-4x/wk which says may last 10 mins-5 hrs, and are not triggered by exertion but worsen with exertion.   Was in hospital in [place] 14 days ago for 2.5 days for similar symptoms as today. Symptoms began after a day of laborious work. Wile admitted, an echo, stress test and U/S of AAA were conducted. Patient and wife are unsure of results, however no interventions performed during admission and was instructed to follow up with cardiologist. Appt 9/20.  Home Medications  Prior to Admission medications   Medication Sig Start Date End Date Taking? Authorizing Provider  aspirin 325 MG tablet Take 325 mg by mouth daily.    Yes Historical Provider, MD  carvedilol (COREG) 3.125 MG tablet Take 1 tablet (3.125 mg total) by mouth 2 (two) times daily. 12/07/11 12/06/12 Yes Karie Schwalbe, MD  lisinopril (PRINIVIL,ZESTRIL) 5 MG tablet Take 1 tablet (5 mg total) by mouth daily. 05/24/11  Yes Kathleene Hazel, MD  mirtazapine (REMERON) 15 MG tablet Take 15 mg by mouth at bedtime as needed. For sleep   Yes Historical Provider,  MD  nitroGLYCERIN (NITROSTAT) 0.4 MG SL tablet Place 0.4 mg under the tongue every 5 (five) minutes as needed. For chest pain   Yes Historical Provider, MD  omeprazole (PRILOSEC) 20 MG capsule Take 20 mg by mouth daily.    Yes Historical Provider, MD  prasugrel (EFFIENT) 10 MG TABS Take 1 tablet (10 mg total) by mouth daily. 09/21/11  Yes Kathleene Hazel, MD  rosuvastatin (CRESTOR) 40 MG tablet Take 1 tablet (40 mg total) by mouth daily. 10/08/11 10/07/12 Yes Kathleene Hazel, MD    Family History  Family History  Problem  Relation Age of Onset  . Coronary artery disease Mother     Died MI age 56  . Hypertension Mother   . Coronary artery disease Sister   . Coronary artery disease Brother   . Diabetes Neg Hx   . Cancer Neg Hx     prostate or colon  . Coronary artery disease Father     Died MI age 58    Social History  History   Social History  . Marital Status: Married    Spouse Name: N/A    Number of Children: 3  . Years of Education: N/A   Occupational History  . Merchandiser, retail( Therapist, sports)    Social History Main Topics  . Smoking status: Former Smoker -- 3.0 packs/day for 25 years    Types: Cigarettes    Quit date: 05/28/1993  . Smokeless tobacco: Never Used  . Alcohol Use: 7.2 oz/week    12 Cans of beer per week  . Drug Use: No  . Sexually Active: Not on file   Other Topics Concern  . Not on file   Social History Narrative  . No narrative on file     Review of Systems General:  No chills, fever, night sweats or weight changes. Cardiovascular: Positive chest pain, dyspnea on exertion, lightheadedness. Negative edema, orthopnea, palpitations, paroxysmal nocturnal dyspnea. Dermatological: No rash, lesions/masses Respiratory: No cough. Positive dyspnea Urologic: No hematuria, dysuria Abdominal:   Positive nausea. No vomiting, diarrhea, bright red blood per rectum, melena, or hematemesis Neurologic:  No visual changes, wkns, changes in mental status. All other systems reviewed and are otherwise negative except as noted above.  Physical Exam  Blood pressure 126/69, pulse 54, temperature 97.8 F (36.6 C), temperature source Oral, resp. rate 17, SpO2 99.00%.  General: Pleasant, NAD Psych: Normal affect. Neuro: Alert and oriented X 3. Moves all extremities spontaneously. HEENT: Normal  Neck: Supple without bruits or JVD. Lungs:  Resp regular and unlabored, CTA. Heart: RRR no s3, s4, or murmurs. Abdomen: Soft, non-tender, non-distended, BS + x 4.  Extremities: No  clubbing, cyanosis or edema. DP/PT - 1+ and equal bilaterally. Radials 2+ and equal bilaterally.  Labs  Trop I 0.00 (poc)  Lab Results  Component Value Date   WBC 6.3 01/03/2012   HGB 14.5 01/03/2012   HCT 40.9 01/03/2012   MCV 92.3 01/03/2012   PLT 298 01/03/2012    Lab 01/03/12 1256  NA 133*  K 4.0  CL 96  CO2 26  BUN 9  CREATININE 0.71  CALCIUM 10.0  PROT 7.2  BILITOT 0.3  ALKPHOS 52  ALT 22  AST 24  GLUCOSE 111*   Radiology/Studies  Dg Chest 2 View  01/03/2012  *RADIOLOGY REPORT*  Clinical Data: Left-sided chest pain.  Dizziness.  Nausea.  Cough. Former smoker.  History of MI and prior CABG.  CHEST - 2 VIEW  Comparison: Portable  chest x-ray 12/13/2007, 03/10/2006, 01/11/2004.  Two-view chest x-ray 08/16/2003.  Findings: Prior sternotomy for CABG.  Cardiac silhouette normal in size, unchanged.  Right coronary and left circumflex coronary artery stents.  Thoracic aorta mildly atherosclerotic, unchanged. Hilar and mediastinal contours otherwise unremarkable.  Lungs clear.  Bronchovascular markings normal.  Pulmonary vascularity normal.  No pneumothorax.  No pleural effusions.  Degenerative changes involving the thoracic spine.  IMPRESSION: No acute cardiopulmonary disease.   Original Report Authenticated By: Arnell Sieving, M.D.    ECG: 01/02/2013 - NSR with PVCs, some T wave flattening in V4-V6 compared to 03/2011.  ASSESSMENT AND PLAN 1. [place]/CAD - extensive history of CAD (CABG x 4, multiple stenting). POC troponin negative. Order full CE's. Stress test 14 days ago in [place] was negative. Plan for heart cath in AM. Cont asa, effient, bb, statin.  Manage pain with morphine. Heparin and NTG started.   2. Renal Artery Stenosis - s/p L RA stent 03/2011.  Follow creat post cath.  3. AAA - 3.2x3.2 in 01/2011.  Plan repeat imagining next month.  4. HTN - continue Lisinopril and Coreg.  5. Hyperlipidemia - continue high dose statin.  6. GERD - continue PPI  Signed, Nicolasa Ducking, NP 01/03/2012, 3:14 PM Patient seen and examined and history reviewed. Agree with above findings and plan. Patient presents with recurrent and refractory chest pain similar to what he had 2 weeks ago in [place]. Had stenting of Left main and LCX one year ago in [place]. LAD is supplied by LIMA. RCA is occluded and all SVGs are occluded. Concern for restenosis of stents. Initial POC troponin negative. Ecg shows new T wave flattening in the lateral leads. Will admit to step down. Start IV heparin and Ntg. Continue ASA at 81 mg and Effient. Plan cardiac cath tomorrow. Patient travels extensively for his work and is afraid to travel until his cardiac status is known.  Theron Arista [facility] 01/03/2012 5:10 PM

## 2012-01-03 NOTE — ED Notes (Signed)
Pt complaining of chest pain, SOB, dizziness, HA. sts pain radiates into his upper back and left arm. sts 2 nitro  PTA. Significant cardiac hx.

## 2012-01-03 NOTE — ED Provider Notes (Signed)
2:51 PM  Date: 01/03/2012  Rate: 80  Rhythm: normal sinus rhythm and premature ventricular contractions (PVC)  QRS Axis: normal  Intervals: normal QRS:  Poor R wave progression in precordial leads suggests old anterior myocardial infarction.   ST/T Wave abnormalities: ST depressions inferiorly and indeterminate  Conduction Disutrbances:none  Narrative Interpretation:  Abnormal EKG  EKG Reviewed: changes noted--PVC's are new since tracing of 12/14/2007.    Carleene Cooper III, MD 01/03/12 [phone]

## 2012-01-03 NOTE — Progress Notes (Signed)
ANTICOAGULATION CONSULT NOTE - Initial Consult  Pharmacy Consult:  Heparin Indication:  ACS / STEMI  Allergies  Allergen Reactions  . Metoprolol Tartrate Hives and Itching    REACTION: HIVES AND ITCHING    Patient Measurements: Height: 5' 4.96" (165 cm) Weight: 145 lb 1 oz (65.8 kg) IBW/kg (Calculated) : 61.41  Heparin Dosing Weight: 66 kg  Vital Signs: Temp: 97.8 F (36.6 C) (09/09 1247) Temp src: Oral (09/09 1247) BP: 151/86 mmHg (09/09 1650) Pulse Rate: 59  (09/09 1650)  Labs:  Basename 01/03/12 1256  HGB 14.5  HCT 40.9  PLT 298  APTT 28  LABPROT 13.1  INR 0.97  HEPARINUNFRC --  CREATININE 0.71  CKTOTAL --  CKMB --  TROPONINI --    Estimated Creatinine Clearance: 84.2 ml/min (by C-G formula based on Cr of 0.71).   Medical History: Past Medical History  Diagnosis Date  . CAD (coronary artery disease)     a. 1995 s/p CABG x 4: LIMA->LAD, VG->RI (known to be occluded), VG->AM->PDA;  b. 05/2002 Inf STEMI: VG->AM->PDA 100% treated w/ 2 BMS complicated by acute thrombosis req 3 BMS;  c. 07/2003 DES to native LAD & LCX (VG's to PDA & RI 100%);  12/2010 Acute MI [facility]): DES to LCX & LM , LIMA ok, VG's 100%.  Marland Kitchen GERD (gastroesophageal reflux disease)   . Hyperlipidemia   . Hypertension   . Diverticulitis     1/06 Diverticulitis--CT of pelvis--difuse sigmoid divertic  . AAA (abdominal aortic aneurysm)     a. Abd U/S 01/2011: 3.2x3.2 cm.  . Renal artery stenosis     a. 03/2011 PTA and stenting of L RA.        Assessment: 8 YOM with PMH significant for HLD , HTN, and CAD s/p CABG x 4 presented to the ED with recurrent chest pain.  Pharmacy consulted to manage patient's IV heparin.  Baseline CBC / INR appropriate to start medication.   Goal of Therapy:  Heparin level 0.3-0.7 units/ml Monitor platelets by anticoagulation protocol: Yes    Plan:  - Heparin 3500 units IV bolus, then - Heparin gtt at 800 units/hr - Check 6 hr HL - Daily HL / CBC     Carsynn Bethune  D. Laney Potash, PharmD, BCPS Pager:  [phone] 01/03/2012, 5:12 PM

## 2012-01-03 NOTE — ED Provider Notes (Signed)
History     CSN: [id number]  Arrival date & time 01/03/12  1245   First MD Initiated Contact with Patient 01/03/12 1406      Chief Complaint  Patient presents with  . Chest Pain  . Dizziness    (Consider location/radiation/quality/duration/timing/severity/associated sxs/prior treatment) Patient is a 61 y.o. male presenting with chest pain. The history is provided by the patient.  Chest Pain The chest pain began 3 - 5 hours ago. Chest pain occurs intermittently. The chest pain is improving. The severity of the pain is moderate. The quality of the pain is described as pressure-like. The pain radiates to the mid back and upper back. Primary symptoms include shortness of breath and dizziness. Pertinent negatives for primary symptoms include no fever, no cough, no wheezing, no palpitations and no abdominal pain.  Dizziness does not occur with weakness.  Pertinent negatives for associated symptoms include no numbness and no weakness.   Pt states onset of chest pressure, dizziness, nausea, sob while working on his grandson's bike. States layied down with no improvement. Took 1sl nitro with pain improvement. States shortly after pain returned, pt took another NG, states pain intermittent since then. Pt states he has had multiple MIs and stenting. States last was done in [place] a year ago. States was recently in the hospital in [place] 2 wks ago for the same, at that time, had chemical stress test and echo, results unknown. Pt states currently pressure is only about 2/10. Denies cough, swelling of legs or arms.   Past Medical History  Diagnosis Date  . CAD (coronary artery disease)   . GERD (gastroesophageal reflux disease)   . Hyperlipidemia   . Hypertension   . Diverticulitis     1/06 Diverticulitis--CT of pelvis--difuse sigmoid divertic  . AAA (abdominal aortic aneurysm)   . Renal artery stenosis     Past Surgical History  Procedure Date  . Coronary artery bypass graft   . Closed  reduction shoulder dislocation   . Angioplasty 1997  . Coronary stent placement 7/12    2 stents--Promus element plus (everolimus eluting)--[facility] in [place]  . Cardiac catheterization     8/09  Cath--vein graft occlusions which are old--no acute changes  . Cardiac catheterization 11/13/2010    stent x 2 @ [place]    Family History  Problem Relation Age of Onset  . Coronary artery disease Mother     Died MI age 68  . Hypertension Mother   . Coronary artery disease Sister   . Coronary artery disease Brother   . Diabetes Neg Hx   . Cancer Neg Hx     prostate or colon  . Coronary artery disease Father     Died MI age 51    History  Substance Use Topics  . Smoking status: Former Smoker -- 3.0 packs/day for 25 years    Types: Cigarettes    Quit date: 05/28/1993  . Smokeless tobacco: Never Used  . Alcohol Use: 7.2 oz/week    12 Cans of beer per week      Review of Systems  Constitutional: Negative for fever and chills.  HENT: Negative for neck pain and neck stiffness.   Eyes: Negative for visual disturbance.  Respiratory: Positive for chest tightness and shortness of breath. Negative for cough and wheezing.   Cardiovascular: Positive for chest pain. Negative for palpitations and leg swelling.  Gastrointestinal: Negative for abdominal pain.  Genitourinary: Negative for dysuria and flank pain.  Musculoskeletal: Negative.  Skin: Negative.   Neurological: Positive for dizziness and light-headedness. Negative for weakness and numbness.  Psychiatric/Behavioral: Negative.     Allergies  Metoprolol tartrate  Home Medications   Current Outpatient Rx  Name Route Sig Dispense Refill  . ASPIRIN 325 MG PO TABS Oral Take 325 mg by mouth daily.     Marland Kitchen CARVEDILOL 3.125 MG PO TABS Oral Take 1 tablet (3.125 mg total) by mouth 2 (two) times daily. 60 tablet 0    Please keep scheduled appointment.  Marland Kitchen LISINOPRIL 5 MG PO TABS Oral Take 1 tablet (5 mg total) by mouth  daily. 30 tablet 6  . MIRTAZAPINE 15 MG PO TABS Oral Take 15 mg by mouth at bedtime as needed. For sleep    . NITROGLYCERIN 0.4 MG SL SUBL Sublingual Place 0.4 mg under the tongue every 5 (five) minutes as needed. For chest pain    . OMEPRAZOLE 20 MG PO CPDR Oral Take 20 mg by mouth daily.     Marland Kitchen PRASUGREL HCL 10 MG PO TABS Oral Take 1 tablet (10 mg total) by mouth daily. 30 tablet 6  . ROSUVASTATIN CALCIUM 40 MG PO TABS Oral Take 1 tablet (40 mg total) by mouth daily. 30 tablet 3    PATIENT NEEDS TO CALL OFFICE TO MAKE APPOINTMENT    BP 130/82  Pulse 81  Temp 97.8 F (36.6 C) (Oral)  Resp 20  SpO2 99%  Physical Exam  Nursing note and vitals reviewed. Constitutional: He is oriented to person, place, and time. He appears well-developed and well-nourished. No distress.  HENT:  Head: Normocephalic.  Eyes: Conjunctivae are normal.  Neck: Neck supple.  Cardiovascular: Normal rate, regular rhythm and normal heart sounds.   Pulmonary/Chest: Effort normal and breath sounds normal. No respiratory distress. He has no wheezes. He has no rales.  Abdominal: Soft. Bowel sounds are normal. He exhibits no distension. There is no tenderness. There is no rebound.  Musculoskeletal: He exhibits no edema.  Neurological: He is alert and oriented to person, place, and time.  Skin: Skin is warm and dry.  Psychiatric: He has a normal mood and affect.    ED Course  Procedures (including critical care time)  Pt with significant CAD, here with CP, currently, pain almost subsided, but he still states feels dizzy. Will get labs, ECG, asa ordered.   Results for orders placed during the hospital encounter of 01/03/12  CBC WITH DIFFERENTIAL      Component Value Range   WBC 6.3  4.0 - 10.5 K/uL   RBC 4.43  4.22 - 5.81 MIL/uL   Hemoglobin 14.5  13.0 - 17.0 g/dL   HCT 29.5  62.1 - 30.8 %   MCV 92.3  78.0 - 100.0 fL   MCH 32.7  26.0 - 34.0 pg   MCHC 35.5  30.0 - 36.0 g/dL   RDW 65.7  84.6 - 96.2 %    Platelets 298  150 - 400 K/uL   Neutrophils Relative 56  43 - 77 %   Neutro Abs 3.5  1.7 - 7.7 K/uL   Lymphocytes Relative 33  12 - 46 %   Lymphs Abs 2.1  0.7 - 4.0 K/uL   Monocytes Relative 7  3 - 12 %   Monocytes Absolute 0.4  0.1 - 1.0 K/uL   Eosinophils Relative 4  0 - 5 %   Eosinophils Absolute 0.3  0.0 - 0.7 K/uL   Basophils Relative 1  0 - 1 %  Basophils Absolute 0.0  0.0 - 0.1 K/uL  COMPREHENSIVE METABOLIC PANEL      Component Value Range   Sodium 133 (*) 135 - 145 mEq/L   Potassium 4.0  3.5 - 5.1 mEq/L   Chloride 96  96 - 112 mEq/L   CO2 26  19 - 32 mEq/L   Glucose, Bld 111 (*) 70 - 99 mg/dL   BUN 9  6 - 23 mg/dL   Creatinine, Ser 9.60  0.50 - 1.35 mg/dL   Calcium 45.4  8.4 - 09.8 mg/dL   Total Protein 7.2  6.0 - 8.3 g/dL   Albumin 3.9  3.5 - 5.2 g/dL   AST 24  0 - 37 U/L   ALT 22  0 - 53 U/L   Alkaline Phosphatase 52  39 - 117 U/L   Total Bilirubin 0.3  0.3 - 1.2 mg/dL   GFR calc non Af Amer >90  >90 mL/min   GFR calc Af Amer >90  >90 mL/min  APTT      Component Value Range   aPTT 28  24 - 37 seconds  PROTIME-INR      Component Value Range   Prothrombin Time 13.1  11.6 - 15.2 seconds   INR 0.97  0.00 - 1.49  PRO B NATRIURETIC PEPTIDE      Component Value Range   Pro B Natriuretic peptide (BNP) 251.0 (*) 0 - 125 pg/mL  POCT I-STAT TROPONIN I      Component Value Range   Troponin i, poc 0.00  0.00 - 0.08 ng/mL   Comment 3            Dg Chest 2 View  01/03/2012  *RADIOLOGY REPORT*  Clinical Data: Left-sided chest pain.  Dizziness.  Nausea.  Cough. Former smoker.  History of MI and prior CABG.  CHEST - 2 VIEW  Comparison: Portable chest x-ray 12/13/2007, 03/10/2006, 01/11/2004.  Two-view chest x-ray 08/16/2003.  Findings: Prior sternotomy for CABG.  Cardiac silhouette normal in size, unchanged.  Right coronary and left circumflex coronary artery stents.  Thoracic aorta mildly atherosclerotic, unchanged. Hilar and mediastinal contours otherwise unremarkable.  Lungs  clear.  Bronchovascular markings normal.  Pulmonary vascularity normal.  No pneumothorax.  No pleural effusions.  Degenerative changes involving the thoracic spine.  IMPRESSION: No acute cardiopulmonary disease.   Original Report Authenticated By: Arnell Sieving, M.D.    2:54 PM So far workup unremarkable. Troponin negative. Will call [place] given pt's CAD and concerning symptoms.   Spoke with [facility], will come see.    1. Chest pain       MDM   Pt with CAD, symptoms concerning. Work up negative. No ECG changes. Cardiology to see.       Lottie Mussel, PA 01/03/12 1537

## 2012-01-04 ENCOUNTER — Encounter (HOSPITAL_COMMUNITY): Payer: Self-pay | Admitting: Nurse Practitioner

## 2012-01-04 ENCOUNTER — Encounter (HOSPITAL_COMMUNITY)
Admission: EM | Disposition: A | Discharge status: Home / Self Care | Payer: Self-pay | Source: Home / Self Care | Attending: Cardiology

## 2012-01-04 DIAGNOSIS — I2510 Atherosclerotic heart disease of native coronary artery without angina pectoris: Secondary | ICD-10-CM

## 2012-01-04 HISTORY — PX: LEFT HEART CATHETERIZATION WITH CORONARY/GRAFT ANGIOGRAM: SHX5450

## 2012-01-04 LAB — CBC
Hemoglobin: 13.7 g/dL (ref 13.0–17.0)
MCH: 32.4 pg (ref 26.0–34.0)
MCHC: 35 g/dL (ref 30.0–36.0)
RDW: 13.9 % (ref 11.5–15.5)

## 2012-01-04 LAB — BASIC METABOLIC PANEL
BUN: 7 mg/dL (ref 6–23)
Creatinine, Ser: 0.78 mg/dL (ref 0.50–1.35)
GFR calc Af Amer: >90 mL/min (ref >90)
GFR calc non Af Amer: >90 mL/min (ref >90)
Glucose, Bld: 103 mg/dL — ABNORMAL HIGH (ref 70–99)
Potassium: 4.5 mEq/L (ref 3.5–5.1)

## 2012-01-04 LAB — PROTIME-INR
INR: 1.08 (ref 0.00–1.49)
Prothrombin Time: 14.2 seconds (ref 11.6–15.2)

## 2012-01-04 LAB — CK TOTAL AND CKMB (NOT AT ARMC)
CK, MB: 2.1 ng/mL (ref 0.3–4.0)
CK, MB: 2.2 ng/mL (ref 0.3–4.0)
Relative Index: INVALID (ref 0.0–2.5)
Total CK: 66 U/L (ref 7–232)
Total CK: 75 U/L (ref 7–232)

## 2012-01-04 LAB — HEPARIN LEVEL (UNFRACTIONATED): Heparin Unfractionated: <0.1 IU/mL — ABNORMAL LOW (ref 0.30–0.70)

## 2012-01-04 LAB — POCT ACTIVATED CLOTTING TIME: Activated Clotting Time: 145 seconds

## 2012-01-04 SURGERY — LEFT HEART CATHETERIZATION WITH CORONARY/GRAFT ANGIOGRAM
Anesthesia: LOCAL

## 2012-01-04 MED ORDER — ISOSORBIDE MONONITRATE ER 30 MG PO TB24
30.0000 mg | ORAL_TABLET | Freq: Once | ORAL | Status: AC
  Administered 2012-01-04: 20:00:00 30 mg via ORAL
  Filled 2012-01-04: qty 1

## 2012-01-04 MED ORDER — NITROGLYCERIN 0.2 MG/ML ON CALL CATH LAB
INTRAVENOUS | Status: AC
  Filled 2012-01-04: qty 1

## 2012-01-04 MED ORDER — MIDAZOLAM HCL 2 MG/2ML IJ SOLN
INTRAMUSCULAR | Status: AC
  Filled 2012-01-04: qty 2

## 2012-01-04 MED ORDER — SODIUM CHLORIDE 0.9 % IV SOLN: INTRAVENOUS | Status: AC

## 2012-01-04 MED ORDER — FENTANYL CITRATE 0.05 MG/ML IJ SOLN
INTRAMUSCULAR | Status: AC
  Filled 2012-01-04: qty 2

## 2012-01-04 MED ORDER — LIDOCAINE HCL (PF) 1 % IJ SOLN
INTRAMUSCULAR | Status: AC
  Filled 2012-01-04: qty 30

## 2012-01-04 MED ORDER — ASPIRIN 81 MG PO TABS: 81.0000 mg | ORAL_TABLET | Freq: Every day | ORAL | Status: DC

## 2012-01-04 MED ORDER — ISOSORBIDE MONONITRATE ER 30 MG PO TB24: 30.0000 mg | ORAL_TABLET | Freq: Every day | ORAL | Status: DC

## 2012-01-04 MED ORDER — HEPARIN (PORCINE) IN NACL 2-0.9 UNIT/ML-% IJ SOLN
INTRAMUSCULAR | Status: AC
  Filled 2012-01-04: qty 2000

## 2012-01-04 MED ORDER — HEPARIN BOLUS VIA INFUSION
2000.0000 [IU] | Freq: Once | INTRAVENOUS | Status: AC
  Administered 2012-01-04: 2000 [IU] via INTRAVENOUS
  Filled 2012-01-04: qty 2000

## 2012-01-04 NOTE — Progress Notes (Signed)
Subjective:  No further chest pain.  Same symptoms in [place].  Enzymes neg  Objective:  Vital Signs in the last 24 hours: Temp:  [97.4 F (36.3 C)-97.8 F (36.6 C)] 97.6 F (36.4 C) (09/10 0400) Pulse Rate:  [47-81] 48  (09/10 0400) Resp:  [10-20] 18  (09/10 0618) BP: (82-162)/(51-86) 116/64 mmHg (09/10 0618) SpO2:  [95 %-100 %] 96 % (09/10 0400) Weight:  [136 lb 11 oz (62 kg)-145 lb 1 oz (65.8 kg)] 139 lb 15.9 oz (63.5 kg) (09/10 0500)  Intake/Output from previous day: 09/09 0701 - 09/10 0700 In: 684.4 [P.O.:250; I.V.:434.4] Out: 650 [Urine:650]   Physical Exam: General: Well developed, well nourished, in no acute distress. Head:  Normocephalic and atraumatic. Lungs: Clear to auscultation and percussion. Heart: Normal S1 and S2.  No murmur, rubs or gallops.  Pulses: Pulses normal in all 4 extremities. Extremities: No clubbing or cyanosis. No edema. Neurologic: Alert and oriented x 3.    Lab Results:  Basename 01/03/12 1256  WBC 6.3  HGB 14.5  PLT 298    Basename 01/03/12 1256  NA 133*  K 4.0  CL 96  CO2 26  GLUCOSE 111*  BUN 9  CREATININE 0.71    Basename 01/04/12 0006 01/03/12 1727  TROPONINI <0.30 <0.30   Hepatic Function Panel  Basename 01/03/12 1256  PROT 7.2  ALBUMIN 3.9  AST 24  ALT 22  ALKPHOS 52  BILITOT 0.3  BILIDIR --  IBILI --   No results found for this basename: CHOL in the last 72 hours No results found for this basename: PROTIME in the last 72 hours  Imaging: Dg Chest 2 View  01/03/2012  *RADIOLOGY REPORT*  Clinical Data: Left-sided chest pain.  Dizziness.  Nausea.  Cough. Former smoker.  History of MI and prior CABG.  CHEST - 2 VIEW  Comparison: Portable chest x-ray 12/13/2007, 03/10/2006, 01/11/2004.  Two-view chest x-ray 08/16/2003.  Findings: Prior sternotomy for CABG.  Cardiac silhouette normal in size, unchanged.  Right coronary and left circumflex coronary artery stents.  Thoracic aorta mildly atherosclerotic, unchanged.  Hilar and mediastinal contours otherwise unremarkable.  Lungs clear.  Bronchovascular markings normal.  Pulmonary vascularity normal.  No pneumothorax.  No pleural effusions.  Degenerative changes involving the thoracic spine.  IMPRESSION: No acute cardiopulmonary disease.   Original Report Authenticated By: Arnell Sieving, M.D.     EKG:  No def change  Cardiac Studies:  Enzymes neg  Assessment/Plan:  Patient Active Hospital Problem List: Unstable angina (01/03/2012)   Assessment: does need restudy but may need to be tomorrow due to schedule.     Plan: liquids, then decide schedule.   Continue meds      Shawnie Pons, MD, [facility], FSCAI 01/04/2012, 7:54 AM

## 2012-01-04 NOTE — CV Procedure (Addendum)
   Cardiac Catheterization Operative Report  [name] [id number] 9/10/20132:56 PM [name], MD  Procedure Performed:  1. Left Heart Catheterization 2. Selective Coronary Angiography 3. LIMA graft angiography 4. Left ventricular angiogram  Operator: Verne Carrow, MD  Indication:  Chest pain, known severe CAD with prior CABG and multiple PCI since then including left main and Circumflex stent in 2012 at [facility].                           Procedure Details: The risks, benefits, complications, treatment options, and expected outcomes were discussed with the patient. The patient and/or family concurred with the proposed plan, giving informed consent. The patient was brought to the cath lab after IV hydration was begun and oral premedication was given. The patient was further sedated with Versed and Fentanyl. The right groin was prepped and draped in the usual manner. Using the modified Seldinger access technique, a 5 French sheath was placed in the right femoral artery. Standard diagnostic catheters were used to perform selective coronary angiography. An IMA catheter was used to engage the LIMA graft. A pigtail catheter was used to perform a left ventricular angiogram.   There were no immediate complications. The patient was taken to the recovery area in stable condition.   Hemodynamic Findings: Central aortic pressure: 123/60 Left ventricular pressure: 133/3/12  Angiographic Findings:  Left main: Patent stent.   Left Anterior Descending Artery: Large caliber vessel.  Patent proximal stent with 30% diffuse in-stent restenosis. The first diagonal is patent and supplied by antegrade flow. There is mild plaque in the diagonal branch. The mid LAD has 100% occlusion. The mid and distal LAD is filled by the patent LIMA graft.   Circumflex Artery: Large caliber vessel with patent proximal stent with mild 10% in-stent restenosis. The first OM branch is patent with serial 40%  stenoses, but no flow limiting lesions. The AV groove Circumflex is patent with mild plaque disease.   Right Coronary Artery: 100% occlusion proximal vessel. The distal vessel fills faintly from left to right collaterals.   Graft Anatomy:  SVG to Ramus is known to be occluded and was not selectively engaged.  SVG to PDA is known to be occluded and was not selectively engaged.  LIMA to LAD is patent.  Left Ventricular Angiogram: LVEF 45-50% with akinesis of the inferior base.   Impression: 1. 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 Patent stents in the proximal Circumflex and and proximal LAD.  2. Low normal LV systolic function  Recommendations: Continue medical management with aggressive risk factor reduction. Will add Imdur 30 mg po Qdaily. He has f/u next week with Dr. Mariah Milling. May consider Ranexa. D/C home today after bedrest.        Complications:  None. The patient tolerated the procedure well.

## 2012-01-04 NOTE — Plan of Care (Signed)
Problem: Discharge Progression Outcomes Goal: Discharge plan in place and appropriate Outcome: Completed/Met Date Met:  01/04/12 Discharged today as planned Goal: Pain controlled with appropriate interventions Outcome: Completed/Met Date Met:  01/04/12 Pain free today Goal: Hemodynamically stable Outcome: Completed/Met Date Met:  01/04/12 VSS Goal: Tolerating diet Outcome: Completed/Met Date Met:  01/04/12 Tolerated dinner and lunch with no problems Goal: Vascular site scale level 0 - I Vascular Site Scale Level 0: No bruising/bleeding/hematoma Level I (Mild): Bruising/Ecchymosis, minimal bleeding/ooozing, palpable hematoma < 3 cm Level II (Moderate): Bleeding not affecting hemodynamic parameters, pseudoaneurysm, palpable hematoma > 3 cm Outcome: Completed/Met Date Met:  01/04/12 Level 0 right groin right pedal pulse palpable

## 2012-01-04 NOTE — Care Management Note (Signed)
    Page 1 of 1   01/04/2012     8:50:38 AM   CARE MANAGEMENT NOTE 01/04/2012  Patient:  [name], [name]   Account Number:  [id number]  Date Initiated:  01/04/2012  Documentation initiated by:  Junius Creamer  Subjective/Objective Assessment:   adm w angina     Action/Plan:   lives w wife, pcp dr r letvak   Anticipated DC Date:     Anticipated DC Plan:        DC Planning Services  CM consult      Choice offered to / List presented to:             Status of service:   Medicare Important Message given?   (If response is "NO", the following Medicare IM given date fields will be blank) Date Medicare IM given:   Date Additional Medicare IM given:    Discharge Disposition:    Per UR Regulation:  Reviewed for med. necessity/level of care/duration of stay  If discussed at Long Length of Stay Meetings, dates discussed:    Comments:  8/10 8:49a debbie Kyrielle Urbanski rn,bsn [phone]

## 2012-01-04 NOTE — Interval H&P Note (Signed)
History and Physical Interval Note:  01/04/2012 2:16 PM  [name]  has presented today for cardiac cath with the diagnosis of chest pain and severe CAD. The various methods of treatment have been discussed with the patient and family. After consideration of risks, benefits and other options for treatment, the patient has consented to  Procedure(s) (LRB) with comments: LEFT HEART CATHETERIZATION WITH CORONARY/GRAFT ANGIOGRAM (N/A) as a surgical intervention .  The patient's history has been reviewed, patient examined, no change in status, stable for surgery.  I have reviewed the patient's chart and labs.  Questions were answered to the patient's satisfaction.     MCALHANY,CHRISTOPHER

## 2012-01-04 NOTE — H&P (View-Only) (Signed)
Subjective:  No further chest pain.  Same symptoms in [place].  Enzymes neg  Objective:  Vital Signs in the last 24 hours: Temp:  [97.4 F (36.3 C)-97.8 F (36.6 C)] 97.6 F (36.4 C) (09/10 0400) Pulse Rate:  [47-81] 48  (09/10 0400) Resp:  [10-20] 18  (09/10 0618) BP: (82-162)/(51-86) 116/64 mmHg (09/10 0618) SpO2:  [95 %-100 %] 96 % (09/10 0400) Weight:  [136 lb 11 oz (62 kg)-145 lb 1 oz (65.8 kg)] 139 lb 15.9 oz (63.5 kg) (09/10 0500)  Intake/Output from previous day: 09/09 0701 - 09/10 0700 In: 684.4 [P.O.:250; I.V.:434.4] Out: 650 [Urine:650]   Physical Exam: General: Well developed, well nourished, in no acute distress. Head:  Normocephalic and atraumatic. Lungs: Clear to auscultation and percussion. Heart: Normal S1 and S2.  No murmur, rubs or gallops.  Pulses: Pulses normal in all 4 extremities. Extremities: No clubbing or cyanosis. No edema. Neurologic: Alert and oriented x 3.    Lab Results:  Basename 01/03/12 1256  WBC 6.3  HGB 14.5  PLT 298    Basename 01/03/12 1256  NA 133*  K 4.0  CL 96  CO2 26  GLUCOSE 111*  BUN 9  CREATININE 0.71    Basename 01/04/12 0006 01/03/12 1727  TROPONINI <0.30 <0.30   Hepatic Function Panel  Basename 01/03/12 1256  PROT 7.2  ALBUMIN 3.9  AST 24  ALT 22  ALKPHOS 52  BILITOT 0.3  BILIDIR --  IBILI --   No results found for this basename: CHOL in the last 72 hours No results found for this basename: PROTIME in the last 72 hours  Imaging: Dg Chest 2 View  01/03/2012  *RADIOLOGY REPORT*  Clinical Data: Left-sided chest pain.  Dizziness.  Nausea.  Cough. Former smoker.  History of MI and prior CABG.  CHEST - 2 VIEW  Comparison: Portable chest x-ray 12/13/2007, 03/10/2006, 01/11/2004.  Two-view chest x-ray 08/16/2003.  Findings: Prior sternotomy for CABG.  Cardiac silhouette normal in size, unchanged.  Right coronary and left circumflex coronary artery stents.  Thoracic aorta mildly atherosclerotic, unchanged.  Hilar and mediastinal contours otherwise unremarkable.  Lungs clear.  Bronchovascular markings normal.  Pulmonary vascularity normal.  No pneumothorax.  No pleural effusions.  Degenerative changes involving the thoracic spine.  IMPRESSION: No acute cardiopulmonary disease.   Original Report Authenticated By: Parrish Daddario E. LAWRENCE, M.D.     EKG:  No def change  Cardiac Studies:  Enzymes neg  Assessment/Plan:  Patient Active Hospital Problem List: Unstable angina (01/03/2012)   Assessment: does need restudy but may need to be tomorrow due to schedule.     Plan: liquids, then decide schedule.   Continue meds      Elaura Calix, MD, FACC, FSCAI 01/04/2012, 7:54 AM    

## 2012-01-04 NOTE — Discharge Summary (Signed)
See rounding note and cath note today. cdm

## 2012-01-04 NOTE — Progress Notes (Signed)
ANTICOAGULATION CONSULT NOTE - Follow Up Consult  Pharmacy Consult for heparin Indication: ACS/ unstable angina  Allergies  Allergen Reactions  . Metoprolol Tartrate Hives and Itching    REACTION: HIVES AND ITCHING    Patient Measurements: Height: 5\' 5"  (165.1 cm) Weight: 139 lb 15.9 oz (63.5 kg) IBW/kg (Calculated) : 61.5  Heparin Dosing Weight: 63.5 kg  Vital Signs: Temp: 97.5 F (36.4 C) (09/10 0752) Temp src: Oral (09/10 0752) BP: 117/72 mmHg (09/10 0755) Pulse Rate: 46  (09/10 0752)  Labs:  Basename 01/04/12 0905 01/04/12 0006 01/03/12 1929 01/03/12 1727 01/03/12 1256  HGB 13.7 -- -- -- 14.5  HCT 39.1 -- -- -- 40.9  PLT 261 -- -- -- 298  APTT -- -- -- -- 28  LABPROT 14.2 -- -- -- 13.1  INR 1.08 -- -- -- 0.97  HEPARINUNFRC 0.53 <0.10* -- -- --  CREATININE -- -- -- -- 0.71  CKTOTAL -- 75 86 -- --  CKMB -- 2.2 2.2 -- --  TROPONINI -- <0.30 -- <0.30 --    Estimated Creatinine Clearance: 84.3 ml/min (by C-G formula based on Cr of 0.71).   Medications:  Scheduled:    . aspirin  324 mg Oral Once  . aspirin  324 mg Oral Pre-Cath  . aspirin EC  81 mg Oral Daily  . atorvastatin  80 mg Oral q1800  . carvedilol  3.125 mg Oral BID  . heparin  2,000 Units Intravenous Once  . heparin  3,500 Units Intravenous Once  . lisinopril  5 mg Oral Daily  .  morphine injection  2 mg Intravenous Once  . morphine      . ondansetron  4 mg Intravenous Once  . pantoprazole  40 mg Oral Q1200  . prasugrel  10 mg Oral Daily  . sodium chloride  3 mL Intravenous Q12H  . DISCONTD: aspirin  324 mg Oral Once    Assessment: 61 y.o male admitted for chest pain with plan for cath tmw. Pharmacy to dose heparin for unstable angina. HL = 0.53 this AM 9/10.  Goal of Therapy:  Heparin level 0.3-0.7 units/ml Monitor platelets by anticoagulation protocol: Yes   Plan:  1. Continue heparin drip rate of 1100 units/hr 2.  8 hour heparin level to confirm therapeutic 3.  Daily HL/CBC  Bola  A. Wandra Feinstein D Clinical Pharmacist [phone] Phone [phone] 01/04/2012 9:59 AM

## 2012-01-04 NOTE — Discharge Summary (Signed)
Patient ID: [name],  MRN: [id number], DOB/AGE: 11-02-1950 61 y.o.  Admit date: 01/03/2012 Discharge date: 01/04/2012  Primary Care Provider: [name] Primary Cardiologist: Concha Se, MD  Discharge Diagnoses Principal Problem:  *Unstable angina  **s/p cath this admission revealing patent LM & LCX stents & patent LIMA->LAD.  Med Rx rec. Active Problems:  CORONARY ARTERY DISEASE  HYPERLIPIDEMIA  HYPERTENSION  GERD  Renal artery stenosis   Allergies Allergies  Allergen Reactions  . Metoprolol Tartrate Hives and Itching    REACTION: HIVES AND ITCHING   Procedures  Cardiac Catheterization 01/04/2012  Hemodynamic Findings: Central aortic pressure: 123/60 Left ventricular pressure: 133/3/12  Angiographic Findings:  Left main: Patent stent.   Left Anterior Descending Artery: Large caliber vessel.  Patent proximal stent with 30% diffuse in-stent restenosis. The first diagonal is patent and supplied by antegrade flow. There is mild plaque in the diagonal branch. The mid LAD has 100% occlusion. The mid and distal LAD is filled by the patent LIMA graft.   Circumflex Artery: Large caliber vessel with patent proximal stent with mild 10% in-stent restenosis. The first OM branch is patent with serial 40% stenoses, but no flow limiting lesions. The AV groove Circumflex is patent with mild plaque disease.   Right Coronary Artery: 100% occlusion proximal vessel. The distal vessel fills faintly from left to right collaterals.   Graft Anatomy:  SVG to Ramus is known to be occluded and was not selectively engaged.   SVG to PDA is known to be occluded and was not selectively engaged.   LIMA to LAD is patent.  Left Ventricular Angiogram: LVEF 45-50% with akinesis of the inferior base. _____________  History of Present Illness  61 y/o male with a long h/o CAD s/p CABG and multiple interventions in the past.  Approximately 2 wks ago, pt was in [place] and developed chest pain.  He  was hospitalized and apparently had a stress test.  Though he did not know the result, he was discharged home w/o further intervention.  On 9/9, he developed recurrent exertional chest pain associated with lightheadedness and nasuea with radiation to his shoulders and left arm.  He took 2 sl NTG with some relief and presented to the [facility] ED.  There, he continued to have mild chest discomfort.  ECG was non-acute and CE were negative.  He was admitted for further evaluation.  Hospital Course  Pt r/o for MI.  He had no further chest pain.  He underwent diagnostic catheterization this afternoon revealing patent LM and LCX stents as well as a patent LIMA -> LAD.  The RCA, mid LAD, and VG's to the PDA and Ramus were known to be occluded.  Medical therapy has been recommended and we will initiate long acting nitrate therapy at discharge this evening.  Discharge Vitals Blood pressure 137/70, pulse 47, temperature 97.8 F (36.6 C), temperature source Oral, resp. rate 19, height 5\' 5"  (1.651 m), weight 139 lb 15.9 oz (63.5 kg), SpO2 94.00%.  Filed Weights   01/03/12 1657 01/03/12 1824 01/04/12 0500  Weight: 145 lb 1 oz (65.8 kg) 136 lb 11 oz (62 kg) 139 lb 15.9 oz (63.5 kg)   Labs  CBC  Basename 01/04/12 0905 01/03/12 1256  WBC 7.3 6.3  NEUTROABS -- 3.5  HGB 13.7 14.5  HCT 39.1 40.9  MCV 92.4 92.3  PLT 261 298   Basic Metabolic Panel  Basename 01/04/12 0905 01/03/12 1256  NA 138 133*  K 4.5 4.0  CL 105 96  CO2 28 26  GLUCOSE 103* 111*  BUN 7 9  CREATININE 0.78 0.71  CALCIUM 9.1 10.0  MG -- --  PHOS -- --   Liver Function Tests  Basename 01/03/12 1256  AST 24  ALT 22  ALKPHOS 52  BILITOT 0.3  PROT 7.2  ALBUMIN 3.9   Cardiac Enzymes  Basename 01/04/12 0905 01/04/12 0006 01/03/12 1929 01/03/12 1727  CKTOTAL 66 75 86 --  CKMB 2.1 2.2 2.2 --  CKMBINDEX -- -- -- --  TROPONINI <0.30 <0.30 -- <0.30   Disposition  Pt is being discharged home today in good  condition.  Follow-up Plans & Appointments  Follow-up Information    Follow up with Julien Nordmann, MD on 01/14/2012. (3:15 PM)    Contact information:   [address] [place] [address] [phone]       Follow up with [name], MD on 12/13/2012. (8:00 AM)    Contact information:   [address] [place] [address] [phone]        Discharge Medications  Medication List  As of 01/04/2012  5:23 PM   TAKE these medications         aspirin 81 MG tablet   Take 1 tablet (81 mg total) by mouth daily.      carvedilol 3.125 MG tablet   Commonly known as: COREG   Take 1 tablet (3.125 mg total) by mouth 2 (two) times daily.      isosorbide mononitrate 30 MG 24 hr tablet   Commonly known as: IMDUR   Take 1 tablet (30 mg total) by mouth daily.      lisinopril 5 MG tablet   Commonly known as: PRINIVIL,ZESTRIL   Take 1 tablet (5 mg total) by mouth daily.      mirtazapine 15 MG tablet   Commonly known as: REMERON   Take 15 mg by mouth at bedtime as needed. For sleep      nitroGLYCERIN 0.4 MG SL tablet   Commonly known as: NITROSTAT   Place 0.4 mg under the tongue every 5 (five) minutes as needed. For chest pain      omeprazole 20 MG capsule   Commonly known as: PRILOSEC   Take 20 mg by mouth daily.      prasugrel 10 MG Tabs   Commonly known as: EFFIENT   Take 1 tablet (10 mg total) by mouth daily.      rosuvastatin 40 MG tablet   Commonly known as: CRESTOR   Take 1 tablet (40 mg total) by mouth daily.          Outstanding Labs/Studies  None  Duration of Discharge Encounter   Greater than 30 minutes including physician time.  Signed, Nicolasa Ducking NP 01/04/2012, 5:23 PM

## 2012-01-04 NOTE — ED Provider Notes (Signed)
Medical screening examination/treatment/procedure(s) were conducted as a shared visit with non-physician practitioner(s) and myself.  I personally evaluated the patient during the encounter 61 yo man with known coronary artery disease, prior CABG, had chest pain in precordial region with radiation to the back.  He had cardiac evaluation for similar episode in [place] 2 weeks ago; evaluation was apparently negative then.  Exam shows lungs clear, heart sounds normal.  EKG and TNI negative.  Advised cardiology evaluation where he has known CAD and ominous symptoms.   Carleene Cooper III, MD 01/04/12 1054

## 2012-01-04 NOTE — Progress Notes (Signed)
ANTICOAGULATION CONSULT NOTE  Pharmacy Consult:  Heparin Indication:  ACS  Allergies  Allergen Reactions  . Metoprolol Tartrate Hives and Itching    REACTION: HIVES AND ITCHING    Patient Measurements: Height: 5\' 5"  (165.1 cm) Weight: 136 lb 11 oz (62 kg) IBW/kg (Calculated) : 61.5  Heparin Dosing Weight: 66 kg  Vital Signs: Temp: 97.8 F (36.6 C) (09/10 0000) Temp src: Oral (09/10 0000) BP: 82/51 mmHg (09/10 0000) Pulse Rate: 57  (09/09 2200)  Labs:  Basename 01/04/12 0006 01/03/12 1929 01/03/12 1727 01/03/12 1256  HGB -- -- -- 14.5  HCT -- -- -- 40.9  PLT -- -- -- 298  APTT -- -- -- 28  LABPROT -- -- -- 13.1  INR -- -- -- 0.97  HEPARINUNFRC <0.10* -- -- --  CREATININE -- -- -- 0.71  CKTOTAL -- 86 -- --  CKMB -- 2.2 -- --  TROPONINI -- -- <0.30 --    Estimated Creatinine Clearance: 84.3 ml/min (by C-G formula based on Cr of 0.71).  Assessment: 61 YO Male with chest pain for Heparin   Goal of Therapy:  Heparin level 0.3-0.7 units/ml Monitor platelets by anticoagulation protocol: Yes   Plan:  Heparin 2000 units IV bolus, then increase heparin  1100 units/hr Follow-up am labs.  Geannie Risen, PharmD, BCPS  -01/04/2012, 12:55 AM

## 2012-01-05 ENCOUNTER — Other Ambulatory Visit (HOSPITAL_COMMUNITY): Payer: Self-pay

## 2012-01-05 MED ORDER — LISINOPRIL 5 MG PO TABS: 5.0000 mg | ORAL_TABLET | Freq: Every day | ORAL | Status: DC

## 2012-01-05 NOTE — Telephone Encounter (Signed)
Refill med 

## 2012-01-14 ENCOUNTER — Encounter: Payer: Self-pay | Admitting: Cardiovascular Disease

## 2012-01-14 ENCOUNTER — Ambulatory Visit (INDEPENDENT_AMBULATORY_CARE_PROVIDER_SITE_OTHER): Payer: PRIVATE HEALTH INSURANCE | Admitting: Cardiovascular Disease

## 2012-01-14 VITALS — BP 131/78 | HR 58 | Ht 65.0 in | Wt 148.0 lb

## 2012-01-14 DIAGNOSIS — E785 Hyperlipidemia, unspecified: Secondary | ICD-10-CM

## 2012-01-14 DIAGNOSIS — I2510 Atherosclerotic heart disease of native coronary artery without angina pectoris: Secondary | ICD-10-CM

## 2012-01-14 DIAGNOSIS — I25810 Atherosclerosis of coronary artery bypass graft(s) without angina pectoris: Secondary | ICD-10-CM

## 2012-01-14 DIAGNOSIS — I701 Atherosclerosis of renal artery: Secondary | ICD-10-CM

## 2012-01-14 DIAGNOSIS — R079 Chest pain, unspecified: Secondary | ICD-10-CM

## 2012-01-14 MED ORDER — EZETIMIBE-ATORVASTATIN 10-80 MG PO TABS: 10.0000 mg | ORAL_TABLET | Freq: Every day | ORAL | Status: DC

## 2012-01-14 NOTE — Assessment & Plan Note (Signed)
Stuttering chest pain, cardiac catheterization as detailed. If he continues to have episodes of chest pain, we will increase isosorbide, continue nitroglycerin sublingual. Could start ranexa.

## 2012-01-14 NOTE — Assessment & Plan Note (Signed)
Stent placed to the left renal artery December 2012. We'll schedule repeat renal artery ultrasound.

## 2012-01-14 NOTE — Patient Instructions (Addendum)
You are doing well. Please take zetia one a day with crestor for two weeks If you have no symptoms, start liptruzet 10/80 one a day   We should check cholesterol in 3 to 6 months  Please call us if you have new issues that need to be addressed before your next appt.  Your physician wants you to follow-up in: 6 months.  You will receive a reminder letter in the mail two months in advance. If you don't receive a letter, please call our office to schedule the follow-up appointment.

## 2012-01-14 NOTE — Assessment & Plan Note (Signed)
Cholesterol is still elevated on Crestor 40 mg daily, we have suggested he try liptruzet 10/80. Recheck cholesterol in 3-6 months. Goal LDL less than 70.

## 2012-01-14 NOTE — Progress Notes (Signed)
Patient ID: [name], male    DOB: 1950/06/16, 61 y.o.   MRN: [id number]  HPI Comments: [name] is a 61 year old gentleman with coronary artery disease, bypass surgery in 1995, stenting at [facility] in February 2004 for MI with a 3.0 x 25 mm and 4.5 x 16 mm Monorail ( location uncertain), also 4.5 x 18 mm stent placed to the mid RCA, followup with repeat stenting in April 2005 to the proximal LAD with a Taxus stent 2.5 x 8 mm, and stenting to the proximal left circumflex with a 2.5 x 12 mm Taxus, with recent stent placed November 13, 2010 with a 4.0 x 9 mm stent placed to the left main. He presents for routine followup.  Recent episodes of chest pain while in [place] with hospitalization at that time, negative stress test Back to the area with recurrent chest pain requiring admission to [facility], repeat stress test performed This showed patent stents to the left circumflex and LAD with patent LIMA to the LAD. Vein graft to the OM and vein graft to the RCA was occluded He was started on isosorbide 30 mg daily in the hospital  Ultrasound of his aorta in [place] 12/22/2011 showed distal aorta 3 cm, iliacs of normal size Previous lower extremity ultrasound  showed no significant disease  He used to be a smoker though stopped in 1995. Prior to that he smoked 3 packs per day Recent cholesterol greater than 200  EKG shows normal sinus rhythm with rate 58 beats per minute with no significant ST or T wave changes         Outpatient Encounter Prescriptions as of 61/20/2013  Medication Sig Dispense Refill  . aspirin 81 MG tablet Take 1 tablet (81 mg total) by mouth daily.      . carvedilol (COREG) 3.125 MG tablet Take 1 tablet (3.125 mg total) by mouth 2 (two) times daily.  60 tablet  0  . isosorbide mononitrate (IMDUR) 30 MG 24 hr tablet Take 1 tablet (30 mg total) by mouth daily.  30 tablet  6  . lisinopril (PRINIVIL,ZESTRIL) 5 MG tablet Take 1 tablet (5 mg total) by mouth daily.   30 tablet  6  . mirtazapine (REMERON) 15 MG tablet Take 15 mg by mouth at bedtime as needed. For sleep      . nitroGLYCERIN (NITROSTAT) 0.4 MG SL tablet Place 0.4 mg under the tongue every 5 (five) minutes as needed. For chest pain      . omeprazole (PRILOSEC) 20 MG capsule Take 20 mg by mouth daily.       . prasugrel (EFFIENT) 10 MG TABS Take 1 tablet (10 mg total) by mouth daily.  30 tablet  6  . rosuvastatin (CRESTOR) 40 MG tablet Take 1 tablet (40 mg total) by mouth daily.  30 tablet  3  . Ezetimibe-Atorvastatin (LIPTRUZET) 10-80 MG TABS Take 10-80 mg by mouth daily.  30 tablet  6     Review of Systems  Constitutional: Negative.   HENT: Negative.   Eyes: Negative.   Respiratory: Negative.   Cardiovascular: Negative.   Gastrointestinal: Negative.   Musculoskeletal: Positive for back pain and gait problem.       Bilateral leg pain  Skin: Negative.   Neurological: Negative.   Hematological: Negative.   Psychiatric/Behavioral: Negative.   All other systems reviewed and are negative.    BP 131/78  Pulse 58  Ht 5\' 5"  (1.651 m)  Wt 148 lb (67.132 kg)  BMI 24.63 kg/m2   Physical Exam  Nursing note and vitals reviewed. Constitutional: He is oriented to person, place, and time. He appears well-developed and well-nourished.  HENT:  Head: Normocephalic.  Nose: Nose normal.  Mouth/Throat: Oropharynx is clear and moist.  Eyes: Conjunctivae normal are normal. Pupils are equal, round, and reactive to light.  Neck: Normal range of motion. Neck supple. No JVD present.  Cardiovascular: Normal rate, regular rhythm, S1 normal, S2 normal, normal heart sounds and intact distal pulses.  Exam reveals no gallop and no friction rub.   No murmur heard. Pulmonary/Chest: Effort normal and breath sounds normal. No respiratory distress. He has no wheezes. He has no rales. He exhibits no tenderness.  Abdominal: Soft. Bowel sounds are normal. He exhibits no distension. There is no tenderness.    Musculoskeletal: Normal range of motion. He exhibits no edema and no tenderness.  Lymphadenopathy:    He has no cervical adenopathy.  Neurological: He is alert and oriented to person, place, and time. Coordination normal.  Skin: Skin is warm and dry. No rash noted. No erythema.  Psychiatric: He has a normal mood and affect. His behavior is normal. Judgment and thought content normal.           Assessment and Plan

## 2012-01-17 ENCOUNTER — Other Ambulatory Visit: Payer: Self-pay | Admitting: *Deleted

## 2012-01-17 ENCOUNTER — Telehealth: Payer: Self-pay

## 2012-01-17 MED ORDER — MIRTAZAPINE 15 MG PO TABS: 15.0000 mg | ORAL_TABLET | Freq: Every evening | ORAL | Status: DC | PRN

## 2012-01-17 NOTE — Telephone Encounter (Signed)
Okay to refill for a year 

## 2012-01-17 NOTE — Telephone Encounter (Signed)
rx called into pharmacy

## 2012-01-17 NOTE — Telephone Encounter (Signed)
Message copied by Marcelle Overlie on Mon Jan 17, 2012  8:45 AM ------      Message from: Antonieta Iba      Created: Fri Jan 14, 2012  4:56 PM       [name] is overdue for renal artery ultrasound      It was severe at the end of last year, repeat scheduled February 2013.      thx

## 2012-01-17 NOTE — Telephone Encounter (Signed)
Please schedule. thanks

## 2012-01-31 ENCOUNTER — Telehealth: Payer: Self-pay | Admitting: Cardiovascular Disease

## 2012-01-31 NOTE — Telephone Encounter (Signed)
Pt mother calling has question concerning pt medications.

## 2012-01-31 NOTE — Telephone Encounter (Signed)
Pts wife called back. She says at last OV, Dr. Mariah Milling mentioned starting pt on liptruzet 10/80 mg, but pt was concerned about cost.  She tells me pt still taking Crestor 40 mg daily but Dr. Mariah Milling also gave pt samples of zetia 10 mg to take with the Crestor. (Liptruzet is combo of zetia and statin). Wife says pt is out of zetia samples and wants to know if we discovered cost of liptruzet. I explained I do not see documentation of this, but we can find out.  In the meantime, i will place more samples of zetia at [facility].  Understanding verb.

## 2012-01-31 NOTE — Telephone Encounter (Signed)
May need your help filling out the form!

## 2012-01-31 NOTE — Telephone Encounter (Signed)
lmtcb Mother not on hippa Will have to talk with pt

## 2012-01-31 NOTE — Telephone Encounter (Signed)
See below Can we call and find out cost of liptruzet for pt? Thanks!!

## 2012-01-31 NOTE — Telephone Encounter (Signed)
Dr.Gollan there is a form in your folder to fill out for this medication because the new medication is not on the patient's formulary plan and not sure if will be covered; needs a prior authorization for this.

## 2012-02-01 NOTE — Telephone Encounter (Signed)
See below

## 2012-02-03 ENCOUNTER — Other Ambulatory Visit: Payer: Self-pay | Admitting: *Deleted

## 2012-02-03 MED ORDER — CARVEDILOL 3.125 MG PO TABS: 3.1250 mg | ORAL_TABLET | Freq: Two times a day (BID) | ORAL | Status: DC

## 2012-02-04 ENCOUNTER — Other Ambulatory Visit: Payer: Self-pay

## 2012-02-04 MED ORDER — ROSUVASTATIN CALCIUM 40 MG PO TABS: 40.0000 mg | ORAL_TABLET | Freq: Every day | ORAL | Status: DC

## 2012-02-09 ENCOUNTER — Telehealth: Payer: Self-pay | Admitting: *Deleted

## 2012-02-09 NOTE — Telephone Encounter (Signed)
Spoke with pt wife and she mentioned that pt is still taking Crestor 40 mg and zetia 10 mg  with the Crestor that Dr. Mariah Milling gave pt samples of Zetia to take due to pt not able to start liptruzet 10/80 mg. Pt is aware that we have not discovered the cost due to prescription not apart of pt's formulary plan and needs to be prior authorized and we are at the moment unsure if her plan will cover prescription. She is aware that we will put samples of Zetia up at [facility] until we can figure out cost of Liptruzet.

## 2012-02-11 ENCOUNTER — Telehealth: Payer: Self-pay | Admitting: *Deleted

## 2012-02-11 NOTE — Telephone Encounter (Signed)
The medication is liptruzet 10/80 mg.

## 2012-02-11 NOTE — Telephone Encounter (Signed)
Dr. Mariah Milling this patient does not have drug coverage with his insurance and will not be able to afford this medication. What do you recommend?

## 2012-02-11 NOTE — Telephone Encounter (Signed)
The patient's insurance company sent the prior authorization back stating the Liptruzet is not covered. The insurance company does cover the two ingredients that is in the Liptruzet. Please advise. We continue to provide patient with samples until this is clarified.

## 2012-02-11 NOTE — Telephone Encounter (Signed)
Spoke with pt's wife and she was made aware that pt's insurance coverage does not have drug coverage in order for Liptruzet to be prior authorized for pharmacy. Pt is aware that we are waiting on response from Dr. Mariah Milling to see what he recommends? Zetia samples were placed @ [facility] for pt to take with Crestor for pick up.

## 2012-02-14 NOTE — Telephone Encounter (Signed)
Would stay on what he was on before crestor 40 mg daily If unable to afford, lipitor 80 mg daily

## 2012-02-15 NOTE — Telephone Encounter (Signed)
See Gollan's response below

## 2012-02-18 ENCOUNTER — Telehealth: Payer: Self-pay

## 2012-02-18 MED ORDER — ROSUVASTATIN CALCIUM 40 MG PO TABS: 40.0000 mg | ORAL_TABLET | Freq: Every day | ORAL | Status: DC

## 2012-02-18 NOTE — Telephone Encounter (Signed)
Refill sent for crestor 40 mg take one tablet daily at bedtime. 

## 2012-02-18 NOTE — Telephone Encounter (Signed)
Sent a new Rx for crestor 40 mg to take one tablet daily at bedtime.

## 2012-02-22 ENCOUNTER — Encounter (INDEPENDENT_AMBULATORY_CARE_PROVIDER_SITE_OTHER): Payer: PRIVATE HEALTH INSURANCE

## 2012-02-22 DIAGNOSIS — I701 Atherosclerosis of renal artery: Secondary | ICD-10-CM

## 2012-02-22 DIAGNOSIS — I10 Essential (primary) hypertension: Secondary | ICD-10-CM

## 2012-02-24 ENCOUNTER — Telehealth: Payer: Self-pay | Admitting: Cardiovascular Disease

## 2012-02-24 ENCOUNTER — Encounter: Payer: Self-pay | Admitting: Cardiovascular Disease

## 2012-02-24 MED ORDER — EZETIMIBE 10 MG PO TABS: 10.0000 mg | ORAL_TABLET | Freq: Every day | ORAL | Status: DC

## 2012-02-24 NOTE — Telephone Encounter (Signed)
RX sent to pharm 

## 2012-02-24 NOTE — Telephone Encounter (Signed)
Pt was to have a script sent into [facility] pharmacy for zetia but it has  Not been done.

## 2012-02-28 ENCOUNTER — Other Ambulatory Visit: Payer: Self-pay

## 2012-02-28 ENCOUNTER — Telehealth: Payer: Self-pay | Admitting: Cardiovascular Disease

## 2012-02-28 ENCOUNTER — Encounter: Payer: Self-pay | Admitting: Cardiovascular Disease

## 2012-02-28 MED ORDER — RANOLAZINE ER 500 MG PO TB12: 500.0000 mg | ORAL_TABLET | Freq: Two times a day (BID) | ORAL | Status: DC

## 2012-02-28 NOTE — Telephone Encounter (Signed)
FYI See below 

## 2012-02-28 NOTE — Telephone Encounter (Signed)
Pt wife states that pt took 3 nitro yesterday and then about 45 mins ago he started not feeling well.  he took BP 161/99,170/96, 171/97, 160/97,172/101, 176/99  pulse 55 readings were 5 mins apart. No nitro today

## 2012-02-28 NOTE — Telephone Encounter (Signed)
Pt's wife says pt had to use SL NTG x 3 yesterday for vague substernal CP. NTG relieved this pain. She says he then told wife he "didnt feel good" 1 hour ago, says he felt anxious/dizzy. BP was checked "too many times" (per wife) Says he has 6 bowel movements today and sometimes has diarrhea.  Decreased appetite. No active chest discomfort at this time. Wife says pt also mentioned to her he sometimes awakes at night feeling sob. Wife wonders if any of this could be r/t anxiety. Says pt is very stressed at this time.  I had wife hold while I discussed with Dr. Kirke Corin (Dr. Mariah Milling in room with a pt).  "Start Ranexa 500 mg BID and give samples x 2 weeks to see how pt tolerates". V.O Dr. Adline Mango, RN  Wife advised of Dr. Jari Sportsman suggestion. I also advised wife to have pt contact PCP (Dr. Demaris Callander) to let them know sleep med is not as effective as it was prior. Understanding verb.  I urged wife to have pt/herself call us back ASAP should he not have any change in symptoms/symptoms get worse. Understanding verb.

## 2012-02-29 NOTE — Telephone Encounter (Signed)
He may benefit from a visit to Dr. Alphonsus Sias. Is he doing regular exercise? Some walking daily may help with stress. Would certainly let us know if chest pain symptoms get worse. ranexa titration up to 1000 mg twice a day may help with chest pain symptoms. If blood pressure continues to be elevated, we could increase the isosorbide to twice a day

## 2012-03-01 NOTE — Telephone Encounter (Signed)
Pt informed Says his CP is much better and he is feeling well on Ranexa 500 mg tablets.  He is going out of town in a few days and will need some more samples of the 500 mg tablets. He will call us when he comes back from trip to let us know how he is feeling on this dose and will up titrate to 1000 mg BID if needed. He has not been keeping a consistent BP log Yesterday am BP=167/100 2 nights ago BP=123/67 I advised him to keep a BP log with BID BP readings for a week or so and call us with results. We will increase isosorbide at that time if needed.

## 2012-03-31 ENCOUNTER — Telehealth: Payer: Self-pay | Admitting: Cardiovascular Disease

## 2012-03-31 MED ORDER — RANOLAZINE ER 500 MG PO TB12: 500.0000 mg | ORAL_TABLET | Freq: Two times a day (BID) | ORAL | Status: DC

## 2012-03-31 NOTE — Telephone Encounter (Signed)
Refilled Ranexa

## 2012-03-31 NOTE — Telephone Encounter (Signed)
RANEXA 500 MG 2 TIMES DAILY [facility]

## 2012-04-04 ENCOUNTER — Other Ambulatory Visit: Payer: Self-pay | Admitting: Cardiovascular Disease

## 2012-04-04 MED ORDER — RANOLAZINE ER 500 MG PO TB12: 500.0000 mg | ORAL_TABLET | Freq: Two times a day (BID) | ORAL | Status: DC

## 2012-04-04 NOTE — Telephone Encounter (Signed)
Marina sent in on 12/6 but it was set to sample and didn't go to pharmacy. Pt needs today

## 2012-04-20 ENCOUNTER — Other Ambulatory Visit: Payer: Self-pay | Admitting: *Deleted

## 2012-04-20 MED ORDER — PRASUGREL HCL 10 MG PO TABS: 10.0000 mg | ORAL_TABLET | Freq: Every day | ORAL | Status: DC

## 2012-05-26 ENCOUNTER — Other Ambulatory Visit: Payer: Self-pay | Admitting: *Deleted

## 2012-05-26 MED ORDER — NITROGLYCERIN 0.4 MG SL SUBL: 0.4000 mg | SUBLINGUAL_TABLET | SUBLINGUAL | Status: DC | PRN

## 2012-05-28 ENCOUNTER — Emergency Department (HOSPITAL_COMMUNITY)
Admission: EM | Admit: 2012-05-28 | Discharge: 2012-05-29 | Disposition: A | Discharge status: Home / Self Care | Payer: 59 | Attending: Emergency Medicine | Admitting: Emergency Medicine

## 2012-05-28 ENCOUNTER — Encounter (HOSPITAL_COMMUNITY): Payer: Self-pay | Admitting: *Deleted

## 2012-05-28 DIAGNOSIS — Z8719 Personal history of other diseases of the digestive system: Secondary | ICD-10-CM | POA: Insufficient documentation

## 2012-05-28 DIAGNOSIS — Z79899 Other long term (current) drug therapy: Secondary | ICD-10-CM | POA: Insufficient documentation

## 2012-05-28 DIAGNOSIS — K029 Dental caries, unspecified: Secondary | ICD-10-CM

## 2012-05-28 DIAGNOSIS — K219 Gastro-esophageal reflux disease without esophagitis: Secondary | ICD-10-CM | POA: Insufficient documentation

## 2012-05-28 DIAGNOSIS — Z951 Presence of aortocoronary bypass graft: Secondary | ICD-10-CM | POA: Insufficient documentation

## 2012-05-28 DIAGNOSIS — K089 Disorder of teeth and supporting structures, unspecified: Secondary | ICD-10-CM | POA: Insufficient documentation

## 2012-05-28 DIAGNOSIS — Z87891 Personal history of nicotine dependence: Secondary | ICD-10-CM | POA: Insufficient documentation

## 2012-05-28 DIAGNOSIS — Z9861 Coronary angioplasty status: Secondary | ICD-10-CM | POA: Insufficient documentation

## 2012-05-28 DIAGNOSIS — Z8679 Personal history of other diseases of the circulatory system: Secondary | ICD-10-CM | POA: Insufficient documentation

## 2012-05-28 DIAGNOSIS — I2510 Atherosclerotic heart disease of native coronary artery without angina pectoris: Secondary | ICD-10-CM | POA: Insufficient documentation

## 2012-05-28 DIAGNOSIS — Z7982 Long term (current) use of aspirin: Secondary | ICD-10-CM | POA: Insufficient documentation

## 2012-05-28 DIAGNOSIS — I10 Essential (primary) hypertension: Secondary | ICD-10-CM | POA: Insufficient documentation

## 2012-05-28 DIAGNOSIS — K047 Periapical abscess without sinus: Secondary | ICD-10-CM | POA: Insufficient documentation

## 2012-05-28 DIAGNOSIS — K0889 Other specified disorders of teeth and supporting structures: Secondary | ICD-10-CM

## 2012-05-28 DIAGNOSIS — E785 Hyperlipidemia, unspecified: Secondary | ICD-10-CM | POA: Insufficient documentation

## 2012-05-28 NOTE — ED Notes (Signed)
Pt c/o dental pain on left bottom side.  States he lost a filling 1 month ago.  Pain became unbearable yesterday.

## 2012-05-29 MED ORDER — OXYCODONE-ACETAMINOPHEN 5-325 MG PO TABS: 1.0000 | ORAL_TABLET | Freq: Four times a day (QID) | ORAL | Status: DC | PRN

## 2012-05-29 MED ORDER — PENICILLIN V POTASSIUM 250 MG PO TABS
500.0000 mg | ORAL_TABLET | Freq: Once | ORAL | Status: AC
  Administered 2012-05-29: 500 mg via ORAL
  Filled 2012-05-29: qty 2

## 2012-05-29 MED ORDER — PENICILLIN V POTASSIUM 500 MG PO TABS: 500.0000 mg | ORAL_TABLET | Freq: Three times a day (TID) | ORAL | Status: DC

## 2012-05-29 MED ORDER — OXYCODONE-ACETAMINOPHEN 5-325 MG PO TABS
2.0000 | ORAL_TABLET | Freq: Once | ORAL | Status: AC
  Administered 2012-05-29: 2 via ORAL
  Filled 2012-05-29: qty 2

## 2012-05-29 NOTE — ED Provider Notes (Signed)
History     CSN: [id number]  Arrival date & time 05/28/12  2344   First MD Initiated Contact with Patient 05/29/12 0003      Chief Complaint  Patient presents with  . Dental Pain   HPI  History provided by the patient. Patient is a 62 year old male with history of hypertension, hyperlipidemia, CAD who presents with complaints of increased and uncontrolled pain to his left lower molar teeth. Patient states that he had a previous cavity was molar tooth fell out one to 2 months ago. He did not have any significant discomforts or problems with the tooth until 2-3 days ago when he began having slight pains. Pain increased significantly over the past 24 hours. Patient has used Tylenol without any relief of pain. Pain is worse with any pressure or chewing. Denies any other aggravating or alleviating factors. Denies any associated fever, chills or sweats. Denies any difficulty swallowing or breathing.    Past Medical History  Diagnosis Date  . CAD (coronary artery disease)     a. 1995 s/p CABG x 4: LIMA->LAD, VG->RI (known to be occluded), VG->AM->PDA;  b. 05/2002 Inf STEMI: VG->AM->PDA 100% treated w/ 2 BMS complicated by acute thrombosis req 3 BMS;  c. 07/2003 DES to  LAD & LCX (VG's to PDA & RI 100%);  d. 12/2010 Acute [place] ([place]): DES to LCX & LM , LIMA ok,;  e.12/2011 Cath: LM/LCX stents ok , LIMA patent.  Marland Kitchen GERD (gastroesophageal reflux disease)   . Hyperlipidemia   . Hypertension   . Diverticulitis     1/06 Diverticulitis--CT of pelvis--difuse sigmoid divertic  . AAA (abdominal aortic aneurysm)     a. Abd U/S 01/2011: 3.2x3.2 cm.  . Renal artery stenosis     a. 03/2011 PTA and stenting of L RA.    Past Surgical History  Procedure Date  . Coronary artery bypass graft   . Closed reduction shoulder dislocation   . Angioplasty 1997  . Coronary stent placement 7/12    2 stents--Promus element plus (everolimus eluting)--[facility] in [place]  . Cardiac catheterization     8/09   Cath--vein graft occlusions which are old--no acute changes  . Cardiac catheterization 11/13/2010    stent x 2 @ [place]    Family History  Problem Relation Age of Onset  . Coronary artery disease Mother     Died MI age 86  . Hypertension Mother   . Coronary artery disease Sister     Living  . Coronary artery disease Brother     Living  . Diabetes Neg Hx   . Cancer Neg Hx     prostate or colon  . Coronary artery disease Father     Died MI age 65    History  Substance Use Topics  . Smoking status: Former Smoker -- 3.0 packs/day for 25 years    Types: Cigarettes    Quit date: 05/28/1993  . Smokeless tobacco: Never Used  . Alcohol Use: 10.8 oz/week    18 Cans of beer per week      Review of Systems  Constitutional: Negative for fever and chills.  HENT: Negative for sore throat and trouble swallowing.   Gastrointestinal: Negative for vomiting.  All other systems reviewed and are negative.    Allergies  Metoprolol tartrate  Home Medications   Current Outpatient Rx  Name  Route  Sig  Dispense  Refill  . ASPIRIN 81 MG PO TABS   Oral   Take 1  tablet (81 mg total) by mouth daily.         Marland Kitchen CARVEDILOL 3.125 MG PO TABS   Oral   Take 1 tablet (3.125 mg total) by mouth 2 (two) times daily.   60 tablet   11   . EZETIMIBE 10 MG PO TABS   Oral   Take 1 tablet (10 mg total) by mouth daily.   90 tablet   3   . ISOSORBIDE MONONITRATE ER 30 MG PO TB24   Oral   Take 1 tablet (30 mg total) by mouth daily.   30 tablet   6   . LISINOPRIL 5 MG PO TABS   Oral   Take 1 tablet (5 mg total) by mouth daily.   30 tablet   6   . MIRTAZAPINE 15 MG PO TABS   Oral   Take 1 tablet (15 mg total) by mouth at bedtime as needed. For sleep   90 tablet   3   . NITROGLYCERIN 0.4 MG SL SUBL   Sublingual   Place 1 tablet (0.4 mg total) under the tongue every 5 (five) minutes as needed. For chest pain   25 tablet   1   . OMEPRAZOLE 20 MG PO CPDR   Oral   Take 20 mg by  mouth daily.          Marland Kitchen PRASUGREL HCL 10 MG PO TABS   Oral   Take 1 tablet (10 mg total) by mouth daily.   30 tablet   6   . RANOLAZINE ER 500 MG PO TB12   Oral   Take 1 tablet (500 mg total) by mouth 2 (two) times daily.   60 tablet   3   . ROSUVASTATIN CALCIUM 40 MG PO TABS   Oral   Take 1 tablet (40 mg total) by mouth daily.   30 tablet   3     BP 130/79  Pulse 68  Temp 97.9 F (36.6 C) (Oral)  Resp 18  SpO2 98%  Physical Exam  Nursing note and vitals reviewed. Constitutional: He is oriented to person, place, and time. He appears well-developed and well-nourished. No distress.  HENT:  Head: Normocephalic.  Mouth/Throat: Oropharynx is clear and moist.         There is significant decay and carried to the left lower second premolar. There is tenderness to percussion over this area. There is no bleeding or drainage. No significant swelling of the surrounding gums were signs of drainable abscess. No swelling under the tongue or signs concerning for Ludwig angina.  Eyes: Conjunctivae normal are normal.  Neck: Normal range of motion. Neck supple.  Cardiovascular: Normal rate and regular rhythm.   Pulmonary/Chest: Effort normal and breath sounds normal.  Abdominal: Soft.  Musculoskeletal: Normal range of motion.  Lymphadenopathy:    He has no cervical adenopathy.  Neurological: He is alert and oriented to person, place, and time.  Skin: Skin is warm.  Psychiatric: His behavior is normal.    ED Course  Procedures       1. Periapical abscess   2. Dental caries   3. Pain, dental       MDM  12:05AM patient seen and evaluated. Patient appears in moderate discomfort but no acute distress.   Patient given prescriptions for pain medications and antibiotic for concerns of periapical abscess. Dental referral provided.     Angus Seller, PA 05/29/12 2015

## 2012-05-30 NOTE — ED Provider Notes (Signed)
Medical screening examination/treatment/procedure(s) were performed by non-physician practitioner and as supervising physician I was immediately available for consultation/collaboration.    Shondra Capps D Donatella Walski, MD 05/30/12 0546 

## 2012-06-10 ENCOUNTER — Other Ambulatory Visit: Payer: Self-pay

## 2012-07-06 ENCOUNTER — Telehealth: Payer: Self-pay | Admitting: *Deleted

## 2012-07-06 NOTE — Telephone Encounter (Signed)
Pt called stating that his ranexa is working great but can no longer afford it. It is 261.00 a month. Wants to know what his alternatives are

## 2012-07-07 NOTE — Telephone Encounter (Signed)
I spoke with pt's wife who says pt has about 7 days left of Ranexa 500 mg tablets I will put samples at [facility] for pt In mean time I will call company to see if he qualifies for any assistance and call them back Understanding verb

## 2012-07-11 NOTE — Telephone Encounter (Signed)
Rep came by to let me know pt may use Ranexa co-pay card Once activated pt may receive ranexa for $5/month x 3 years I will call wife to inform

## 2012-07-11 NOTE — Telephone Encounter (Signed)
pts wife informed Card placed at [facility]

## 2012-07-11 NOTE — Telephone Encounter (Signed)
lmtcb on Joey's cell with Ranexa at [phone]

## 2012-08-07 ENCOUNTER — Other Ambulatory Visit: Payer: Self-pay

## 2012-08-07 ENCOUNTER — Other Ambulatory Visit: Payer: Self-pay | Admitting: *Deleted

## 2012-08-07 MED ORDER — LISINOPRIL 5 MG PO TABS: 5.0000 mg | ORAL_TABLET | Freq: Every day | ORAL | Status: DC

## 2012-08-07 MED ORDER — ISOSORBIDE MONONITRATE ER 30 MG PO TB24: 30.0000 mg | ORAL_TABLET | Freq: Every day | ORAL | Status: DC

## 2012-08-07 MED ORDER — RANOLAZINE ER 500 MG PO TB12: 500.0000 mg | ORAL_TABLET | Freq: Two times a day (BID) | ORAL | Status: DC

## 2012-08-07 NOTE — Telephone Encounter (Signed)
Refilled Isosorbide and lisinopril to [facility] pharmacy. Pt scheduled for 6 month f/u 08/21/12.

## 2012-08-07 NOTE — Telephone Encounter (Signed)
Refilled Ranexa #60 Refill#3 sent to [facility].

## 2012-08-07 NOTE — Telephone Encounter (Signed)
Refill sent for isosorbide 30 mg take one tablet daily. 

## 2012-08-07 NOTE — Telephone Encounter (Signed)
Refill sent for lisinopril 5 mg

## 2012-08-08 ENCOUNTER — Other Ambulatory Visit: Payer: Self-pay

## 2012-08-08 MED ORDER — LISINOPRIL 5 MG PO TABS: 5.0000 mg | ORAL_TABLET | Freq: Every day | ORAL | Status: DC

## 2012-08-21 ENCOUNTER — Ambulatory Visit: Payer: PRIVATE HEALTH INSURANCE | Admitting: Cardiovascular Disease

## 2012-10-03 ENCOUNTER — Other Ambulatory Visit: Payer: Self-pay

## 2012-10-03 MED ORDER — ROSUVASTATIN CALCIUM 40 MG PO TABS: 40.0000 mg | ORAL_TABLET | Freq: Every day | ORAL | Status: DC

## 2012-10-03 NOTE — Telephone Encounter (Signed)
Refill sent for crestor.  

## 2012-10-25 ENCOUNTER — Emergency Department (HOSPITAL_COMMUNITY): Payer: 59

## 2012-10-25 ENCOUNTER — Encounter (HOSPITAL_COMMUNITY): Payer: Self-pay | Admitting: Emergency Medicine

## 2012-10-25 ENCOUNTER — Telehealth: Payer: Self-pay

## 2012-10-25 ENCOUNTER — Observation Stay (HOSPITAL_COMMUNITY)
Admission: EM | Admit: 2012-10-25 | Discharge: 2012-10-26 | Disposition: A | Discharge status: Home / Self Care | Payer: 59 | Attending: Cardiology | Admitting: Cardiology

## 2012-10-25 ENCOUNTER — Observation Stay (HOSPITAL_COMMUNITY): Payer: 59

## 2012-10-25 DIAGNOSIS — Z951 Presence of aortocoronary bypass graft: Secondary | ICD-10-CM | POA: Insufficient documentation

## 2012-10-25 DIAGNOSIS — Z79899 Other long term (current) drug therapy: Secondary | ICD-10-CM | POA: Insufficient documentation

## 2012-10-25 DIAGNOSIS — Z9861 Coronary angioplasty status: Secondary | ICD-10-CM | POA: Insufficient documentation

## 2012-10-25 DIAGNOSIS — I10 Essential (primary) hypertension: Secondary | ICD-10-CM | POA: Insufficient documentation

## 2012-10-25 DIAGNOSIS — I714 Abdominal aortic aneurysm, without rupture, unspecified: Secondary | ICD-10-CM | POA: Insufficient documentation

## 2012-10-25 DIAGNOSIS — E785 Hyperlipidemia, unspecified: Secondary | ICD-10-CM | POA: Insufficient documentation

## 2012-10-25 DIAGNOSIS — I2510 Atherosclerotic heart disease of native coronary artery without angina pectoris: Secondary | ICD-10-CM | POA: Insufficient documentation

## 2012-10-25 DIAGNOSIS — I200 Unstable angina: Secondary | ICD-10-CM | POA: Insufficient documentation

## 2012-10-25 DIAGNOSIS — R072 Precordial pain: Principal | ICD-10-CM | POA: Insufficient documentation

## 2012-10-25 HISTORY — DX: Acute myocardial infarction, unspecified: I21.9

## 2012-10-25 HISTORY — DX: Angina pectoris, unspecified: I20.9

## 2012-10-25 LAB — CBC
MCH: 32.7 pg (ref 26.0–34.0)
MCHC: 35.9 g/dL (ref 30.0–36.0)
Platelets: 225 10*3/uL (ref 150–400)
RDW: 13.5 % (ref 11.5–15.5)

## 2012-10-25 LAB — POCT I-STAT TROPONIN I: Troponin i, poc: 0 ng/mL (ref 0.00–0.08)

## 2012-10-25 LAB — HEPATIC FUNCTION PANEL
ALT: 14 U/L (ref 0–53)
Alkaline Phosphatase: 45 U/L (ref 39–117)
Total Protein: 6.2 g/dL (ref 6.0–8.3)

## 2012-10-25 LAB — BASIC METABOLIC PANEL
Calcium: 8.6 mg/dL (ref 8.4–10.5)
GFR calc Af Amer: >90 mL/min (ref >90)
GFR calc non Af Amer: >90 mL/min (ref >90)
Sodium: 132 mEq/L — ABNORMAL LOW (ref 135–145)

## 2012-10-25 LAB — PRO B NATRIURETIC PEPTIDE: Pro B Natriuretic peptide (BNP): 143.1 pg/mL — ABNORMAL HIGH (ref 0–125)

## 2012-10-25 LAB — APTT: aPTT: 30 seconds (ref 24–37)

## 2012-10-25 LAB — HEPARIN LEVEL (UNFRACTIONATED): Heparin Unfractionated: 0.44 IU/mL (ref 0.30–0.70)

## 2012-10-25 MED ORDER — IOHEXOL 350 MG/ML SOLN
100.0000 mL | Freq: Once | INTRAVENOUS | Status: AC | PRN
  Administered 2012-10-25: 100 mL via INTRAVENOUS

## 2012-10-25 MED ORDER — EZETIMIBE 10 MG PO TABS
10.0000 mg | ORAL_TABLET | Freq: Every day | ORAL | Status: DC
Start: 2012-10-26 — End: 2012-10-26
  Administered 2012-10-26: 10 mg via ORAL
  Filled 2012-10-25: qty 1

## 2012-10-25 MED ORDER — HEPARIN BOLUS VIA INFUSION
4000.0000 [IU] | Freq: Once | INTRAVENOUS | Status: AC
  Administered 2012-10-25: 4000 [IU] via INTRAVENOUS

## 2012-10-25 MED ORDER — CARVEDILOL 3.125 MG PO TABS
3.1250 mg | ORAL_TABLET | Freq: Two times a day (BID) | ORAL | Status: DC
  Filled 2012-10-25 (×3): qty 1

## 2012-10-25 MED ORDER — TRAMADOL HCL 50 MG PO TABS
50.0000 mg | ORAL_TABLET | Freq: Two times a day (BID) | ORAL | Status: DC | PRN
  Administered 2012-10-25: 50 mg via ORAL
  Filled 2012-10-25: qty 1

## 2012-10-25 MED ORDER — LISINOPRIL 5 MG PO TABS
5.0000 mg | ORAL_TABLET | Freq: Every day | ORAL | Status: DC
  Administered 2012-10-26: 5 mg via ORAL
  Filled 2012-10-25: qty 1

## 2012-10-25 MED ORDER — ONDANSETRON HCL 4 MG/2ML IJ SOLN
INTRAMUSCULAR | Status: AC
  Filled 2012-10-25: qty 2

## 2012-10-25 MED ORDER — SODIUM CHLORIDE 0.9 % IV SOLN
INTRAVENOUS | Status: AC
  Filled 2012-10-25: qty 50

## 2012-10-25 MED ORDER — LORAZEPAM 1 MG PO TABS: 0.0000 mg | ORAL_TABLET | Freq: Two times a day (BID) | ORAL | Status: DC

## 2012-10-25 MED ORDER — PANTOPRAZOLE SODIUM 40 MG PO TBEC
40.0000 mg | DELAYED_RELEASE_TABLET | Freq: Two times a day (BID) | ORAL | Status: DC
  Filled 2012-10-25: qty 1

## 2012-10-25 MED ORDER — SODIUM CHLORIDE 0.9 % IJ SOLN: 3.0000 mL | INTRAMUSCULAR | Status: DC | PRN

## 2012-10-25 MED ORDER — LORAZEPAM 1 MG PO TABS: 0.0000 mg | ORAL_TABLET | Freq: Four times a day (QID) | ORAL | Status: DC

## 2012-10-25 MED ORDER — VITAMIN B-1 100 MG PO TABS
100.0000 mg | ORAL_TABLET | Freq: Every day | ORAL | Status: DC
  Administered 2012-10-25 – 2012-10-26 (×2): 100 mg via ORAL
  Filled 2012-10-25 (×2): qty 1

## 2012-10-25 MED ORDER — MORPHINE SULFATE 4 MG/ML IJ SOLN
4.0000 mg | Freq: Once | INTRAMUSCULAR | Status: AC
  Administered 2012-10-25: 4 mg via INTRAVENOUS
  Filled 2012-10-25: qty 1

## 2012-10-25 MED ORDER — LORAZEPAM 2 MG/ML IJ SOLN: 1.0000 mg | Freq: Four times a day (QID) | INTRAMUSCULAR | Status: DC | PRN

## 2012-10-25 MED ORDER — ONDANSETRON HCL 4 MG/2ML IJ SOLN: 4.0000 mg | Freq: Four times a day (QID) | INTRAMUSCULAR | Status: DC | PRN

## 2012-10-25 MED ORDER — LORAZEPAM 1 MG PO TABS: 1.0000 mg | ORAL_TABLET | Freq: Four times a day (QID) | ORAL | Status: DC | PRN

## 2012-10-25 MED ORDER — PRASUGREL HCL 10 MG PO TABS
10.0000 mg | ORAL_TABLET | Freq: Every day | ORAL | Status: DC
  Administered 2012-10-26: 10 mg via ORAL
  Filled 2012-10-25: qty 1

## 2012-10-25 MED ORDER — SODIUM CHLORIDE 0.9 % IV SOLN: 250.0000 mL | INTRAVENOUS | Status: DC | PRN

## 2012-10-25 MED ORDER — ACETAMINOPHEN 325 MG PO TABS
650.0000 mg | ORAL_TABLET | ORAL | Status: DC | PRN
  Administered 2012-10-26: 650 mg via ORAL
  Filled 2012-10-25: qty 2

## 2012-10-25 MED ORDER — ADULT MULTIVITAMIN W/MINERALS CH
1.0000 | ORAL_TABLET | Freq: Every day | ORAL | Status: DC
  Administered 2012-10-25 – 2012-10-26 (×2): 1 via ORAL
  Filled 2012-10-25 (×2): qty 1

## 2012-10-25 MED ORDER — THIAMINE HCL 100 MG/ML IJ SOLN
100.0000 mg | Freq: Every day | INTRAMUSCULAR | Status: DC
  Filled 2012-10-25 (×2): qty 1

## 2012-10-25 MED ORDER — NITROGLYCERIN 0.4 MG SL SUBL: 0.4000 mg | SUBLINGUAL_TABLET | SUBLINGUAL | Status: DC | PRN

## 2012-10-25 MED ORDER — FOLIC ACID 1 MG PO TABS
1.0000 mg | ORAL_TABLET | Freq: Every day | ORAL | Status: DC
  Administered 2012-10-25 – 2012-10-26 (×2): 1 mg via ORAL
  Filled 2012-10-25 (×2): qty 1

## 2012-10-25 MED ORDER — ISOSORBIDE MONONITRATE ER 30 MG PO TB24
30.0000 mg | ORAL_TABLET | Freq: Every day | ORAL | Status: DC
  Administered 2012-10-25: 30 mg via ORAL
  Filled 2012-10-25 (×3): qty 1

## 2012-10-25 MED ORDER — SODIUM CHLORIDE 0.9 % IJ SOLN: 3.0000 mL | Freq: Two times a day (BID) | INTRAMUSCULAR | Status: DC

## 2012-10-25 MED ORDER — ASPIRIN EC 81 MG PO TBEC
81.0000 mg | DELAYED_RELEASE_TABLET | Freq: Every day | ORAL | Status: DC
  Administered 2012-10-26: 81 mg via ORAL
  Filled 2012-10-25: qty 1

## 2012-10-25 MED ORDER — ATORVASTATIN CALCIUM 80 MG PO TABS
80.0000 mg | ORAL_TABLET | Freq: Every day | ORAL | Status: DC
  Filled 2012-10-25: qty 1

## 2012-10-25 MED ORDER — SODIUM CHLORIDE 0.9 % IV SOLN: INTRAVENOUS | Status: DC

## 2012-10-25 MED ORDER — OMEPRAZOLE 20 MG PO CPDR
20.0000 mg | DELAYED_RELEASE_CAPSULE | Freq: Every day | ORAL | Status: DC
Start: 2012-10-25 — End: 2012-10-26
  Administered 2012-10-25 – 2012-10-26 (×2): 20 mg via ORAL
  Filled 2012-10-25 (×2): qty 1

## 2012-10-25 MED ORDER — HEPARIN (PORCINE) IN NACL 100-0.45 UNIT/ML-% IJ SOLN
800.0000 [IU]/h | INTRAMUSCULAR | Status: DC
  Administered 2012-10-25: 950 [IU]/h via INTRAVENOUS
  Filled 2012-10-25 (×2): qty 250

## 2012-10-25 MED ORDER — MIRTAZAPINE 15 MG PO TABS
15.0000 mg | ORAL_TABLET | Freq: Every evening | ORAL | Status: DC | PRN
  Filled 2012-10-25: qty 1

## 2012-10-25 NOTE — Telephone Encounter (Signed)
Pt's wife calling to say pt called her to come home from work because he is having active CP, "like he used to have, only worse" associated with legs feeling "like rubber", BP=148/87, HR=66 BPM, SOB I advised, based on his cardiac hx (CABG in 1995, etc) and the fact that he is having active symptoms, he should proceed to nearest ER Wife verb understanding

## 2012-10-25 NOTE — ED Provider Notes (Signed)
History    CSN: [id number] Arrival date & time 10/25/12  1051  First MD Initiated Contact with Patient 10/25/12 1102     Chief Complaint  Patient presents with  . Chest Pain   (Consider location/radiation/quality/duration/timing/severity/associated sxs/prior Treatment) HPI  [name] is a 62 y.o.male with a significant PMH of GERD, CAD, MIs, AAA,  and multiple cardiac stents including CABG presents to the ER with complaints of chest pain that started acutely at 9 am this morning. It was a pressure sensation that concerned him with a dull "charlie horse" pain to his left arm. He called EMS right away to come to the ED after taking 324 aspirin and 2 Nitro. The nitro helped relieve his pain. In the ER he is currently pain free. He tells me he gets pain often but that this pain feels like his heart attack and he is concerned. Also associated with dizziness and some nausea.  Past Medical History  Diagnosis Date  . CAD (coronary artery disease)     a. 1995 s/p CABG x 4: LIMA->LAD, VG->RI (known to be occluded), VG->AM->PDA;  b. 05/2002 Inf STEMI: VG->AM->PDA 100% treated w/ 2 BMS complicated by acute thrombosis req 3 BMS;  c. 07/2003 DES to  LAD & LCX (VG's to PDA & RI 100%);  d. 12/2010 Acute [place] ([place]): DES to LCX & LM , LIMA ok,;  e.12/2011 Cath: LM/LCX stents ok , LIMA patent.  Marland Kitchen GERD (gastroesophageal reflux disease)   . Hyperlipidemia   . Hypertension   . Diverticulitis     1/06 Diverticulitis--CT of pelvis--difuse sigmoid divertic  . AAA (abdominal aortic aneurysm)     a. Abd U/S 01/2011: 3.2x3.2 cm.  . Renal artery stenosis     a. 03/2011 PTA and stenting of L RA.   Past Surgical History  Procedure Laterality Date  . Coronary artery bypass graft    . Closed reduction shoulder dislocation    . Angioplasty  1997  . Coronary stent placement  7/12    2 stents--Promus element plus (everolimus eluting)--[facility] in [place]  . Cardiac catheterization      8/09  Cath--vein  graft occlusions which are old--no acute changes  . Cardiac catheterization  11/13/2010    stent x 2 @ [place]   Family History  Problem Relation Age of Onset  . Coronary artery disease Mother     Died MI age 70  . Hypertension Mother   . Coronary artery disease Sister     Living  . Coronary artery disease Brother     Living  . Diabetes Neg Hx   . Cancer Neg Hx     prostate or colon  . Coronary artery disease Father     Died MI age 79   History  Substance Use Topics  . Smoking status: Former Smoker -- 3.00 packs/day for 25 years    Types: Cigarettes    Quit date: 05/28/1993  . Smokeless tobacco: Never Used  . Alcohol Use: 10.8 oz/week    18 Cans of beer per week     Comment: few beers/day    Review of Systems  Cardiovascular: Positive for chest pain.  Neurological: Positive for dizziness.  All other systems reviewed and are negative.    Allergies  Metoprolol tartrate  Home Medications   Current Outpatient Rx  Name  Route  Sig  Dispense  Refill  . aspirin 81 MG tablet   Oral   Take 1 tablet (81 mg total) by  mouth daily.         . carvedilol (COREG) 3.125 MG tablet   Oral   Take 1 tablet (3.125 mg total) by mouth 2 (two) times daily.   60 tablet   11   . ezetimibe (ZETIA) 10 MG tablet   Oral   Take 1 tablet (10 mg total) by mouth daily.   90 tablet   3   . isosorbide mononitrate (IMDUR) 30 MG 24 hr tablet   Oral   Take 1 tablet (30 mg total) by mouth daily.   30 tablet   6   . lisinopril (PRINIVIL,ZESTRIL) 5 MG tablet   Oral   Take 1 tablet (5 mg total) by mouth daily.   30 tablet   6   . mirtazapine (REMERON) 15 MG tablet   Oral   Take 1 tablet (15 mg total) by mouth at bedtime as needed. For sleep   90 tablet   3   . naproxen sodium (ANAPROX) 220 MG tablet   Oral   Take 440 mg by mouth 2 (two) times daily as needed (pain).         . nitroGLYCERIN (NITROSTAT) 0.4 MG SL tablet   Sublingual   Place 1 tablet (0.4 mg total) under the  tongue every 5 (five) minutes as needed. For chest pain   25 tablet   1   . omeprazole (PRILOSEC) 20 MG capsule   Oral   Take 20 mg by mouth daily.          . prasugrel (EFFIENT) 10 MG TABS   Oral   Take 1 tablet (10 mg total) by mouth daily.   30 tablet   6   . rosuvastatin (CRESTOR) 40 MG tablet   Oral   Take 1 tablet (40 mg total) by mouth daily.   30 tablet   6    BP 122/66  Pulse 64  Temp(Src) 98.3 F (36.8 C) (Oral)  Resp 18  SpO2 99% Physical Exam  Nursing note and vitals reviewed. Constitutional: He appears well-developed and well-nourished. No distress.  HENT:  Head: Normocephalic and atraumatic.  Eyes: Pupils are equal, round, and reactive to light.  Neck: Normal range of motion. Neck supple.  Cardiovascular: Normal rate and regular rhythm.   Pulmonary/Chest: Effort normal.  Abdominal: Soft.  Neurological: He is alert.  Skin: Skin is warm and dry.    ED Course  Procedures (including critical care time) Labs Reviewed  CBC - Abnormal; Notable for the following:    HCT 38.4 (*)    All other components within normal limits  BASIC METABOLIC PANEL - Abnormal; Notable for the following:    Sodium 132 (*)    Glucose, Bld 108 (*)    All other components within normal limits  PRO B NATRIURETIC PEPTIDE - Abnormal; Notable for the following:    Pro B Natriuretic peptide (BNP) 143.1 (*)    All other components within normal limits  TROPONIN I  POCT I-STAT TROPONIN I   Dg Chest 2 View  10/25/2012   *RADIOLOGY REPORT*  Clinical Data: Chest pain, some shortness of breath  CHEST - 2 VIEW  Comparison: Chest x-ray of 01/03/2012  Findings: No active infiltrate or effusion is seen.  Mediastinal contours appear normal.  There is mild peribronchial thickening which may indicate bronchitis.  The heart is within upper limits of normal.  Median sternotomy sutures are noted from prior CABG.  No acute bony abnormality is seen.  IMPRESSION: No active  lung disease.   Peribronchial thickening may indicate bronchitis.   Original Report Authenticated By: Dwyane Dee, M.D.   No diagnosis found.  MDM   Date: 10/25/2012  Rate: 60  Rhythm: Sinus rhythm  QRS Axis: normal  Intervals: normal  ST/T Wave abnormalities: normal  Conduction Disutrbances:PVCs  Narrative Interpretation:   Old EKG Reviewed: unchanged from Sept 10, 2013  Chest xray is negative and first troponin is negative. Will call [facility].  [facility] has agreed to come see patient. He is currently pain free but pain has been difficult to control.   End of shift, Marissa, PA-C will be following-up on this patient.        Dorthula Matas, PA-C 10/25/12 1304

## 2012-10-25 NOTE — ED Provider Notes (Signed)
Medical screening examination/treatment/procedure(s) were performed by non-physician practitioner and as supervising physician I was immediately available for consultation/collaboration.   Bunyan Brier, MD 10/25/12 1535 

## 2012-10-25 NOTE — ED Notes (Addendum)
Pt arrives via EMS, with chest pain that began approx 1.5hrs ago. Pain was central chest with radiation to left arm. Pt c/o nausea, no vomiting and visible diaphoresis. Pt reports SOB, and lightheaded sensation with the pain. Pt initially 6/10, relief with nitro x2. Pt with 18g LAC. 324 ASA PTA. Pt alert, oriented x4, NAD at present.

## 2012-10-25 NOTE — Progress Notes (Signed)
ANTICOAGULATION CONSULT NOTE - Follow Up Consult  Pharmacy Consult for heparin Indication: chest pain/ACS  Labs:  Recent Labs  10/25/12 1123 10/25/12 1317 10/25/12 1435 10/25/12 1805 10/25/12 2315  HGB 13.8  --   --   --   --   HCT 38.4*  --   --   --   --   PLT 225  --   --   --   --   APTT  --   --  30  --   --   LABPROT  --   --  12.6  --   --   INR  --   --  0.96  --   --   HEPARINUNFRC  --   --   --   --  0.44  CREATININE 0.67  --   --   --   --   TROPONINI  --  <0.30  --  <0.30  --      Assessment/Plan:  62yo male therapeutic on heparin with initial dosing for CP.  Will continue gtt at current rate and confirm stable with am labs.  Vernard Gambles, PharmD, BCPS  10/25/2012,11:43 PM

## 2012-10-25 NOTE — ED Provider Notes (Signed)
Transfer of care from Marlon Pel, PA-C at 1:30PM at change in shift. [name] is a 62 y/o M with PMHx of CAD with 10 stent placements - cardiac cath x 2 in 2009 and 2011 - 2 bypass surgeries, GERD, HLD, AAA, HTN, diverticulitis, presenting to the ED with chest pain that started today described as a pressure to the center of the chest, substernal with radiation to the left arm with associated numbness and tingling to the left arm and hand. Patient took ASA and was given nitroglycerin.  [facility] to see patient - call and discussion performed by Marlon Pel, PA-C.  2:40PM Re-assessed patient - denied chest pain at the moment. Denied left arm numbness and tingling. Denied back pain. Cardiac: Normal rhythm, mild bradycardia noted, pulses palpable to radial and DP - 2+. Lungs clear to auscultation bilaterally. Strength 5+/5+ with resistance. Discussed plan with patient and that patient to be admitted to the hospital.  Patient seen by [facility] - to be admitted to the hospital by [facility] - admitting physician Dr. Olga Millers to Telemetry.   Raymon Mutton, PA-C 10/25/12 1628

## 2012-10-25 NOTE — Progress Notes (Signed)
ANTICOAGULATION CONSULT NOTE - Initial Consult  Pharmacy Consult for heparin Indication: chest pain/ACS  Allergies  Allergen Reactions  . Metoprolol Tartrate Hives and Itching    REACTION: HIVES AND ITCHING    Patient Measurements: Wt= 150 lbs per patient (68kg) Ht=  5' 5'' IBW= 61.5 Heparin Dosing Weight: 68kg  Vital Signs: Temp: 98.3 F (36.8 C) (07/02 1056) Temp src: Oral (07/02 1056) BP: 152/72 mmHg (07/02 1430) Pulse Rate: 52 (07/02 1430)  Labs:  Recent Labs  10/25/12 1123 10/25/12 1317  HGB 13.8  --   HCT 38.4*  --   PLT 225  --   CREATININE 0.67  --   TROPONINI  --  <0.30    The CrCl is unknown because both a height and weight (above a minimum accepted value) are required for this calculation.   Medical History: Past Medical History  Diagnosis Date  . CAD (coronary artery disease)     a. 1995 s/p CABG x 4: LIMA->LAD, VG->RI (known to be occluded), VG->AM->PDA;  b. 05/2002 Inf STEMI: VG->AM->PDA 100% treated w/ 2 BMS complicated by acute thrombosis req 3 BMS;  c. 07/2003 DES to  LAD & LCX (VG's to PDA & RI 100%);  d. 12/2010 Acute [place] ([place]): DES to LCX & LM , LIMA ok,;  e.12/2011 Cath: LM/LCX stents ok , LIMA patent.  Marland Kitchen GERD (gastroesophageal reflux disease)   . Hyperlipidemia   . Hypertension   . Diverticulitis     1/06 Diverticulitis--CT of pelvis--diffuse sigmoid divertic  . AAA (abdominal aortic aneurysm)     a. Duplex 01/2012: stable infrarenal saccular AAA at 3.3cm x 3.2xm (f/u recommended 01/2013 per Dr. Mariah Milling)  . Renal artery stenosis     a. 03/2011 PTA and stenting of L RA. b. 01/2012 renal duplex - stable 1-59% R renal artery stenosis, normal L renal artery s/p stent.  . Back pain      Assessment: 62 yo male here with CP to start heparin.  Goal of Therapy:  Heparin level 0.3-0.7 units/ml Monitor platelets by anticoagulation protocol: Yes   Plan:  -Heparin bolus 4000 units IV followed by 950 units/hr (~ 14 units/kg/hr) -Heparin level in 6  hours and daily wth CBC daily  Harland German, Pharm D 10/25/2012 3:31 PM

## 2012-10-25 NOTE — ED Notes (Signed)
Verified with John in pharmacy heparin does not require Saline carrier.

## 2012-10-25 NOTE — ED Notes (Signed)
CT prior to transfer upstairs

## 2012-10-25 NOTE — H&P (Signed)
History and Physical  Patient ID: [name] MRN: [id number], DOB: 16-Aug-1950 Date of Encounter: 10/25/2012, 1:22 PM Primary Physician: [name], MD Primary Cardiologist: Mariah Milling  Chief Complaint: chest pain Reason for Admission: chest pain  HPI: [name] is a 62 y/o M with a history of HTN, HL, GERD, renal artery stenosis, and complex history of CAD s/p CABG 1995 & PCIs who presented to [facility] today with CP. He initially underwent CABG 1995. In 2004 he had a STEMI with VG->AM->PDA 100% treated w/ 2 BMS complicated by acute thrombosis req 3 BMS. He had DES to LAD & LCx 2005 ((VG's to PDA & RI 100%). In 12/2010 he had acute MI in [place] s/p DES to LCX & LM. In 12/2011, he had his last catheterization for chest pain which demonstrated patent stents and 1/3 grafts patent (LIMA). Med rx was recommended with Imdur. Ranexa was later added but he discontinued this because it was too expensive even with the discount assistance card. He stopped taking it about 5 months ago. He is unsure if it made any difference but he did not have any CP during the 3 months of treatment. He has had a few episodes of chest pain since discontinuation about once a month, not provoked by anything in particular including exertion. He has not had to seek care for these episodes. However, this morning while sitting down with a cup of coffee, he began to feel lightheaded and couldn't catch his breath. He developed substernal chest pressure reminiscent of his prior angina, but it radiated to his arm which was different than before. He denies any nausea, diaphoresis, palpitations or syncope. The pain began to escalate so he called 911. He took 4 baby ASA. When EMS arrived he got 1 SL NTG with slight relief then a 2nd SL NTG with significant relief. The pain was subsiding upon arrival to the ER but began increasing again when he went down for his CXR. This was relieved by morphine. He is currently pain free. He endorses  med compliance including with Imdur which he takes qhs. He & his wife drove to [place] last month but split the drive over 2 days with several stops in between. The pain is not worse with palpation. There is a component of increased awareness of pain with inspiration. He is not tachycardic, tachypnic or hypoxic. VSS in the ED. pBNP 143. Initial troponin neg x 2. Na 132, glu 108, o/w labs nonacute. CXR showed no active lung disease except that peribronchial thickening may indicate bronchitis. He is afebrile. He denies cough.  He no longer smokes but drinks 3-6 beers daily. He denies any melena, hematemesis, or BRBPR. These symptoms are not similar to his GERD.  Past Medical History  Diagnosis Date  . CAD (coronary artery disease)     a. 1995 s/p CABG x 4: LIMA->LAD, VG->RI (known to be occluded), VG->AM->PDA;  b. 05/2002 Inf STEMI: VG->AM->PDA 100% treated w/ 2 BMS complicated by acute thrombosis req 3 BMS;  c. 07/2003 DES to  LAD & LCX (VG's to PDA & RI 100%);  d. 12/2010 Acute MI ([place]): DES to LCX & LM , LIMA ok,;  e.12/2011 Cath: LM/LCX stents ok , LIMA patent.  Marland Kitchen GERD (gastroesophageal reflux disease)   . Hyperlipidemia   . Hypertension   . Diverticulitis     1/06 Diverticulitis--CT of pelvis--difuse sigmoid divertic  . AAA (abdominal aortic aneurysm)     a. Abd U/S 01/2011: 3.2x3.2 cm.  . Renal  artery stenosis     a. 03/2011 PTA and stenting of L RA.     Most Recent Cardiac Studies: Cardiac Cath 12/2011 Procedure Performed:  1. Left Heart Catheterization 2. Selective Coronary Angiography 3. LIMA graft angiography 4. Left ventricular angiogram Operator: Verne Carrow, MD  Indication: Chest pain, known severe CAD with prior CABG and multiple PCI since then including left main and Circumflex stent in 2012 at [facility].  Procedure Details:  The risks, benefits, complications, treatment options, and expected outcomes were discussed with the patient. The patient and/or family concurred with the  proposed plan, giving informed consent. The patient was brought to the cath lab after IV hydration was begun and oral premedication was given. The patient was further sedated with Versed and Fentanyl. The right groin was prepped and draped in the usual manner. Using the modified Seldinger access technique, a 5 French sheath was placed in the right femoral artery. Standard diagnostic catheters were used to perform selective coronary angiography. An IMA catheter was used to engage the LIMA graft. A pigtail catheter was used to perform a left ventricular angiogram.  There were no immediate complications. The patient was taken to the recovery area in stable condition.  Hemodynamic Findings:  Central aortic pressure: 123/60  Left ventricular pressure: 133/3/12  Angiographic Findings:  Left main: Patent stent.  Left Anterior Descending Artery: Large caliber vessel. Patent proximal stent with 30% diffuse in-stent restenosis. The first diagonal is patent and supplied by antegrade flow. There is mild plaque in the diagonal branch. The mid LAD has 100% occlusion. The mid and distal LAD is filled by the patent LIMA graft.  Circumflex Artery: Large caliber vessel with patent proximal stent with mild 10% in-stent restenosis. The first OM branch is patent with serial 40% stenoses, but no flow limiting lesions. The AV groove Circumflex is patent with mild plaque disease.  Right Coronary Artery: 100% occlusion proximal vessel. The distal vessel fills faintly from left to right collaterals.  Graft Anatomy:  SVG to Ramus is known to be occluded and was not selectively engaged.  SVG to PDA is known to be occluded and was not selectively engaged.  LIMA to LAD is patent.  Left Ventricular Angiogram: LVEF 45-50% with akinesis of the inferior base.  Impression:  1. 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 Patent stents in the proximal Circumflex and and proximal LAD.  2. Low normal LV systolic  function  Recommendations: Continue medical management with aggressive risk factor reduction. Will add Imdur 30 mg po Qdaily. He has f/u next week with Dr. Mariah Milling. May consider Ranexa. D/C home today after bedrest.  Complications: None. The patient tolerated the procedure well.   2D Echo 11/2010 Study Conclusions - Left ventricle: The cavity size was normal. There was mild concentric hypertrophy. Systolic function was normal. The estimated ejection fraction was in the range of 50% to 55%. Hypokinesis of the inferior and posterior myocardium. Septal hypokinesis consistent with postoperative state. Doppler parameters are consistent with abnormal left ventricular relaxation (grade 1 diastolic dysfunction). - Aortic valve: Trivial regurgitation. - Right ventricle: The cavity size was mildly dilated. Wall thickness was normal. Systolic function was mildly reduced. - Pulmonary arteries: Systolic pressure was within the normal range.  Renal artery duplex 01/2012 Stable infrarenal saccular AAA at 3.3cm x 3.2xm Stable 1-59% R renal artery stenosis Normal L renal artery s/p stent --> Dr. Mariah Milling advised f/u 1 year per epic.   Surgical History:  Past Surgical  History  Procedure Laterality Date  . Coronary artery bypass graft    . Closed reduction shoulder dislocation    . Angioplasty  1997  . Coronary stent placement  7/12    2 stents--Promus element plus (everolimus eluting)--[facility] in [place]  . Cardiac catheterization      8/09  Cath--vein graft occlusions which are old--no acute changes  . Cardiac catheterization  11/13/2010    stent x 2 @ [place]     Home Meds: Prior to Admission medications   Medication Sig Start Date End Date Taking? Authorizing Provider  aspirin 81 MG tablet Take 1 tablet (81 mg total) by mouth daily. 01/04/12  Yes Ok Anis, NP  carvedilol (COREG) 3.125 MG tablet Take 1 tablet (3.125 mg total) by mouth 2 (two) times daily. 02/03/12 02/02/13  Yes Karie Schwalbe, MD  ezetimibe (ZETIA) 10 MG tablet Take 1 tablet (10 mg total) by mouth daily. 02/24/12  Yes Antonieta Iba, MD  isosorbide mononitrate (IMDUR) 30 MG 24 hr tablet Take 1 tablet (30 mg total) by mouth daily. 08/07/12 08/07/13 Yes Antonieta Iba, MD  lisinopril (PRINIVIL,ZESTRIL) 5 MG tablet Take 1 tablet (5 mg total) by mouth daily. 08/08/12  Yes Antonieta Iba, MD  mirtazapine (REMERON) 15 MG tablet Take 1 tablet (15 mg total) by mouth at bedtime as needed. For sleep 01/17/12  Yes Karie Schwalbe, MD  naproxen sodium (ANAPROX) 220 MG tablet Take 440 mg by mouth 2 (two) times daily as needed (pain).   Yes Historical Provider, MD  nitroGLYCERIN (NITROSTAT) 0.4 MG SL tablet Place 1 tablet (0.4 mg total) under the tongue every 5 (five) minutes as needed. For chest pain 05/26/12  Yes Karie Schwalbe, MD  omeprazole (PRILOSEC) 20 MG capsule Take 20 mg by mouth daily.    Yes Historical Provider, MD  prasugrel (EFFIENT) 10 MG TABS Take 1 tablet (10 mg total) by mouth daily. 04/20/12  Yes Kathleene Hazel, MD  rosuvastatin (CRESTOR) 40 MG tablet Take 1 tablet (40 mg total) by mouth daily. 10/03/12 10/03/13 Yes Antonieta Iba, MD    Allergies:  Allergies  Allergen Reactions  . Metoprolol Tartrate Hives and Itching    REACTION: HIVES AND ITCHING    History   Social History  . Marital Status: Married    Spouse Name: N/A    Number of Children: 3  . Years of Education: N/A   Occupational History  . Merchandiser, retail (Restaurant manager, fast food)   .     Social History Main Topics  . Smoking status: Former Smoker -- 3.00 packs/day for 25 years    Types: Cigarettes    Quit date: 05/28/1993  . Smokeless tobacco: Never Used  . Alcohol Use: 10.8 oz/week    18 Cans of beer per week     Comment: few beers/day  . Drug Use: No  . Sexually Active: Not on file   Other Topics Concern  . Not on file   Social History Narrative  . No narrative on file     Family History    Problem Relation Age of Onset  . Coronary artery disease Mother     Died MI age 60  . Hypertension Mother   . Coronary artery disease Sister     Living  . Coronary artery disease Brother     Living  . Diabetes Neg Hx   . Cancer Neg Hx     prostate or colon  . Coronary  artery disease Father     Died MI age 30    Review of Systems: General: negative for chills, fever, night sweats or weight changes.  Cardiovascular: see above Dermatological: negative for rash Respiratory: negative for cough or wheezing Urologic: negative for hematuria Abdominal: negative for nausea, vomiting, diarrhea, bright red blood per rectum, melena, or hematemesis Neurologic: negative for visual changes, syncope All other systems reviewed and are otherwise negative except as noted above.  Labs:   Lab Results  Component Value Date   WBC 6.7 10/25/2012   HGB 13.8 10/25/2012   HCT 38.4* 10/25/2012   MCV 91.0 10/25/2012   PLT 225 10/25/2012    Recent Labs Lab 10/25/12 1123  NA 132*  K 3.6  CL 99  CO2 24  BUN 9  CREATININE 0.67  CALCIUM 8.6  GLUCOSE 108*   poc troponin neg x 1 then full troponin neg x 1  Lab Results  Component Value Date   CHOL 209* 12/14/2011   HDL 49.90 12/14/2011   TRIG 258.0* 12/14/2011    Radiology/Studies:  Dg Chest 2 View 10/25/2012   *RADIOLOGY REPORT*  Clinical Data: Chest pain, some shortness of breath  CHEST - 2 VIEW  Comparison: Chest x-ray of 01/03/2012  Findings: No active infiltrate or effusion is seen.  Mediastinal contours appear normal.  There is mild peribronchial thickening which may indicate bronchitis.  The heart is within upper limits of normal.  Median sternotomy sutures are noted from prior CABG.  No acute bony abnormality is seen.  IMPRESSION: No active lung disease.  Peribronchial thickening may indicate bronchitis.   Original Report Authenticated By: Dwyane Dee, M.D.     EKG: NSR 60bpm one PVC, TW flattening II/avF/V3, TWI III not significantly changed from  prior  Physical Exam: Blood pressure 136/73, pulse 62, temperature 98.3 F (36.8 C), temperature source Oral, resp. rate 14, SpO2 100.00%. General: Well developed thin WM, in no acute distress. Head: Normocephalic, atraumatic, sclera non-icteric, no xanthomas, nares are without discharge.  Neck: Negative for carotid bruits. JVD not elevated. Lungs: Clear bilaterally to auscultation without wheezes, rales, or rhonchi. Breathing is unlabored. Heart: RRR with S1 S2. No murmurs, rubs, or gallops appreciated. Abdomen: Soft, non-tender, non-distended with normoactive bowel sounds. No hepatomegaly. No rebound/guarding. No obvious abdominal masses. Msk:  Strength and tone appear normal for age. Extremities: No clubbing or cyanosis. No edema.  Distal pedal pulses are 2+ and equal bilaterally. Neuro: Alert and oriented X 3. No focal deficit. No facial asymmetry. Moves all extremities spontaneously. Psych:  Responds to questions appropriately with a normal affect.   ASSESSMENT AND PLAN:  1. Chest pain concerning for [place], but some atypical features 2. CAD s/p CABG 1995 (1/3 grafts known to be open) and several PCIs 3. HTN, controlled 4. HLD, on Crestor/Zetia 5. AAA, stable by [place] 01/2012, recommended for f/u 01/2013 per Dr. Mariah Milling 6. RAS (s/p L RA stent, mild R RAS), stable by [place] 01/2012 7. GERD 8. EtOH use, counseled regarding cutting down  Will discuss further w/u with Dr. Jens Som.  Signed, Dayna Dunn PA-C 10/25/2012, 1:22 PM  As above, patient seen and examined. 62 year old male with past medical history of coronary artery disease status post coronary artery bypass and graft, hypertension, hyperlipidemia, abdominal aortic aneurysm with unstable angina. Last cardiac catheterization in September of 2013 showed patent stents in the left main and circumflex and patent LIMA to the LAD. Right coronary was occluded. Medical therapy recommended. Patient has continued to have intermittent chest  pain.  This occurs average once every 2 weeks. His chest pain is substernal and radiates to his left upper extremity. It is not exertional. It typically occurs at rest. He describes associated shortness of breath but no nausea or diaphoresis. He had more severe pain today and presented to the emergency room. His pain is presently much improved. Note he recently drove to [place] and [place]. He does state there is a pleuritic component to his chest pain. Initial enzymes negative. Electrocardiogram sinus rhythm with occasional PVC and no ST changes. Plan to admit and rule out myocardial infarction with serial enzymes. Relatively recent catheterization and medical therapy recommended. Some of his symptoms also sound chronic. If enzymes negative plan Myoview for risk stratification. If enzymes positive proceed with catheterization. Given recent travel check d-dimer. If abnormal schedule CT scan. Olga Millers 2:18 PM

## 2012-10-26 ENCOUNTER — Observation Stay (HOSPITAL_COMMUNITY): Payer: 59

## 2012-10-26 DIAGNOSIS — R079 Chest pain, unspecified: Secondary | ICD-10-CM

## 2012-10-26 DIAGNOSIS — I200 Unstable angina: Secondary | ICD-10-CM

## 2012-10-26 DIAGNOSIS — R072 Precordial pain: Secondary | ICD-10-CM | POA: Diagnosis present

## 2012-10-26 LAB — CBC
Hemoglobin: 12.7 g/dL — ABNORMAL LOW (ref 13.0–17.0)
MCH: 32.2 pg (ref 26.0–34.0)
MCV: 91.1 fL (ref 78.0–100.0)
RBC: 3.95 MIL/uL — ABNORMAL LOW (ref 4.22–5.81)

## 2012-10-26 LAB — LIPID PANEL
HDL: 40 mg/dL (ref >39)
LDL Cholesterol: 50 mg/dL (ref 0–99)
Triglycerides: 80 mg/dL (ref <150)
VLDL: 16 mg/dL (ref 0–40)

## 2012-10-26 LAB — BASIC METABOLIC PANEL
BUN: 6 mg/dL (ref 6–23)
CO2: 21 mEq/L (ref 19–32)
Calcium: 8.5 mg/dL (ref 8.4–10.5)
Glucose, Bld: 93 mg/dL (ref 70–99)
Potassium: 3.8 mEq/L (ref 3.5–5.1)
Sodium: 135 mEq/L (ref 135–145)

## 2012-10-26 MED ORDER — REGADENOSON 0.4 MG/5ML IV SOLN
INTRAVENOUS | Status: AC
  Administered 2012-10-26: 0.4 mg via INTRAVENOUS
  Filled 2012-10-26: qty 5

## 2012-10-26 MED ORDER — ACETAMINOPHEN 325 MG PO TABS
ORAL_TABLET | ORAL | Status: AC
  Filled 2012-10-26: qty 2

## 2012-10-26 MED ORDER — HEPARIN (PORCINE) IN NACL 100-0.45 UNIT/ML-% IJ SOLN
900.0000 [IU]/h | INTRAMUSCULAR | Status: DC
  Administered 2012-10-26: 900 [IU]/h via INTRAVENOUS

## 2012-10-26 MED ORDER — TECHNETIUM TC 99M SESTAMIBI GENERIC - CARDIOLITE
30.0000 | Freq: Once | INTRAVENOUS | Status: AC | PRN
  Administered 2012-10-26: 30 via INTRAVENOUS

## 2012-10-26 MED ORDER — TRAMADOL HCL 50 MG PO TABS: 50.0000 mg | ORAL_TABLET | Freq: Two times a day (BID) | ORAL | Status: DC | PRN

## 2012-10-26 MED ORDER — TECHNETIUM TC 99M SESTAMIBI GENERIC - CARDIOLITE
10.0000 | Freq: Once | INTRAVENOUS | Status: AC | PRN
  Administered 2012-10-26: 10 via INTRAVENOUS

## 2012-10-26 MED ORDER — REGADENOSON 0.4 MG/5ML IV SOLN
0.4000 mg | Freq: Once | INTRAVENOUS | Status: AC
  Filled 2012-10-26: qty 5

## 2012-10-26 NOTE — Progress Notes (Signed)
ANTICOAGULATION CONSULT NOTE - Follow Up Consult  Pharmacy Consult for heparin Indication: chest pain/ACS  Labs:  Recent Labs  10/25/12 1123 10/25/12 1317 10/25/12 1435 10/25/12 1805 10/25/12 2315 10/26/12 0605  HGB 13.8  --   --   --   --  12.7*  HCT 38.4*  --   --   --   --  36.0*  PLT 225  --   --   --   --  199  APTT  --   --  30  --   --   --   LABPROT  --   --  12.6  --   --   --   INR  --   --  0.96  --   --   --   HEPARINUNFRC  --   --   --   --  0.44 0.87*  CREATININE 0.67  --   --   --   --   --   TROPONINI  --  <0.30  --  <0.30 <0.30  --     Assessment: 62yo male now supratherapeutic on heparin after one level at goal; not yet on cath schedule.  Goal of Therapy:  Heparin level 0.3-0.7 units/ml  Plan:  Will decrease heparin gtt by 2 units/kg/hr to 800 units/hr and check level in 6hr.  Vernard Gambles, PharmD, BCPS  10/26/2012,6:45 AM

## 2012-10-26 NOTE — Progress Notes (Signed)
Lexiscan Myoview performed. 

## 2012-10-26 NOTE — Discharge Summary (Signed)
CARDIOLOGY DISCHARGE SUMMARY   Patient ID: [name] MRN: [id number] DOB/AGE: [birth year] 62 y.o.  Admit date: 10/25/2012 Discharge date: 10/26/2012  Primary Discharge Diagnosis:  Precordial Chest pain, possibly pleuritic  Secondary Discharge Diagnosis:    HYPERLIPIDEMIA   HYPERTENSION   Intermediate coronary syndrome  Procedures: [facility] Course: [name] is a 62 y.o. male with a history of CAD and bypass surgery with subsequent stenting. He had prolonged chest pain he came to the hospital where he was admitted for further evaluation and treatment.  He had some increase in his chest pain with inspiration and palpation of his lower sternal area. He had some improvement in his pain with morphine but also with nitroglycerin in the emergency room. After admission, he was given Tylenol, also with improvement in his symptoms. He had a recent long drive so a chest CT was performed which was negative for PE or other acute abnormality.   His cardiac enzymes were negative for MI. Lipid profile was performed and reviewed. His lipids are under good control and no medication changes were made. His blood pressure medication and his blood pressure recordings were reviewed. His blood pressure was under good control until his medications were held for a stress test.  On 10/26/2012, since his cardiac enzymes were negative, he had a Lexi scan Cardiolite performed. The full results are below. It was a technically difficult study but did not show any clear-cut ischemia. The study was felt to be low risk.  With the improvement in his symptoms and the low risk stress test, no further inpatient workup is indicated. He had a short course of Tramadol added to his medication regimen. Dr. Jens Som evaluated [name] and considered stable for discharge, to follow up as an outpatient in [place].  Labs:   Lab Results  Component Value Date   WBC 7.0 10/26/2012   HGB 12.7* 10/26/2012   HCT 36.0* 10/26/2012   MCV 91.1 10/26/2012   PLT 199 10/26/2012     Recent Labs Lab 10/25/12 1435 10/26/12 0605  NA  --  135  K  --  3.8  CL  --  104  CO2  --  21  BUN  --  6  CREATININE  --  0.67  CALCIUM  --  8.5  PROT 6.2  --   BILITOT 0.4  --   ALKPHOS 45  --   ALT 14  --   AST 17  --   GLUCOSE  --  93    Recent Labs  10/25/12 1805 10/25/12 2315 10/26/12 0605  TROPONINI <0.30 <0.30 <0.30   Lipid Panel     Component Value Date/Time   CHOL 106 10/26/2012 0605   TRIG 80 10/26/2012 0605   HDL 40 10/26/2012 0605   CHOLHDL 2.7 10/26/2012 0605   VLDL 16 10/26/2012 0605   LDLCALC 50 10/26/2012 0605    Pro B Natriuretic peptide (BNP)  Date/Time Value Range Status  10/25/2012 11:24 AM 143.1* 0 - 125 pg/mL Final  01/03/2012 12:57 PM 251.0* 0 - 125 pg/mL Final    Recent Labs  10/25/12 1435  INR 0.96      Radiology: Dg Chest 2 View 10/25/2012   *RADIOLOGY REPORT*  Clinical Data: Chest pain, some shortness of breath  CHEST - 2 VIEW  Comparison: Chest x-ray of 01/03/2012  Findings: No active infiltrate or effusion is seen.  Mediastinal contours appear normal.  There is mild peribronchial thickening which may indicate bronchitis.  The heart  is within upper limits of normal.  Median sternotomy sutures are noted from prior CABG.  No acute bony abnormality is seen.  IMPRESSION: No active lung disease.  Peribronchial thickening may indicate bronchitis.   Original Report Authenticated By: Dwyane Dee, M.D.   Ct Angio Chest Pe W/cm &/or Wo Cm 10/25/2012   *RADIOLOGY REPORT*  Clinical Data: Chest pain  CT ANGIOGRAPHY CHEST  Technique:  Multidetector CT imaging of the chest using the standard protocol during bolus administration of intravenous contrast. Multiplanar reconstructed images including MIPs were obtained and reviewed to evaluate the vascular anatomy.  Contrast: OMNIPAQUE IOHEXOL 350 MG/ML SOLN  Comparison: None.  Findings: There are no filling defects in the pulmonary arterial tree to  suggest acute pulmonary thromboembolism.  Limited contrast opacification in the systemic arterial system demonstrates that the diameters of the aorta at the sinus of Valsalva, sinotubular junction, and ascending aorta are 4.3 cm, 3.3 cm, and 3.7 cm.  Status post sternotomy and.  Widespread coronary artery calcifications are present.  The heart is normal in size.  No abnormal mediastinal or hilar adenopathy.  Dependent atelectasis at the lung bases without consolidation.  No pneumothorax or pleural effusion.  No destructive bone lesion.  Minimal contrast reflux within the hepatic veins.  IMPRESSION: No evidence of acute pulmonary thromboembolism.   Original Report Authenticated By: Jolaine Click, M.D.   Nm Myocar Multi W/spect W/wall Motion / Ef 10/26/2012   [name] is a 62 yo with hx of CAD.  He presented to the hospital with episodes of chest pain.  He was scheduled for a Lexiscan Myoview study for further evaluation.  The patient was injected with the resting dose of Myoview.  SPECT images were obtained following brief delay.  The patient then received an injection of Lexiscan.  This was followed by second dose of Myoview.  Quantitated gated SPECT images were obtained following brief delay.  The raw data images reveal that there is significant uptake in the structures below the diaphragm.  There is significant uptake in the bowel which obscures the inferior aspect of the myocardium quite good.  The stress  Myoview images reveal a large severe defect in the mid and basilar inferior walls.  There is also some inhomogeneity of the images due to the markedly and tense uptake in the structures below the diaphragm.  The resting Myoview images reveal a similar pattern of a severe large defect in the mid and basilar inferior walls with some degree of inhomogeneity in the other regions.  There is also some significant bowel uptake.  The quantitated gated SPECT images reveal an end diastolic volume of 66 ml.   End-systolic volume is 38 ml.  The ejection fraction is 43%.  There is akinesis of the mid and distal inferior wall.  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  Ejection fraction is 43%.  I do not see any evidence of ischemia on this study however the study is somewhat technically difficult because of the significant uptake of radionuclide in structure below the diaphragm. These this is a low risk study.  Vesta Mixer, jr. , MD, [facility]   Original Report Authenticated By: Kristeen Miss, M.D.   EKG:  Sinus bradycardia, rate 55, no acute ischemic changes  FOLLOW UP PLANS AND APPOINTMENTS Allergies  Allergen Reactions  . Metoprolol Tartrate Hives and Itching    REACTION: HIVES AND ITCHING  Medication List         aspirin 81 MG tablet  Take 1 tablet (81 mg total) by mouth daily.     carvedilol 3.125 MG tablet  Commonly known as:  COREG  Take 1 tablet (3.125 mg total) by mouth 2 (two) times daily.     ezetimibe 10 MG tablet  Commonly known as:  ZETIA  Take 1 tablet (10 mg total) by mouth daily.     isosorbide mononitrate 30 MG 24 hr tablet  Commonly known as:  IMDUR  Take 1 tablet (30 mg total) by mouth daily.     lisinopril 5 MG tablet  Commonly known as:  PRINIVIL,ZESTRIL  Take 1 tablet (5 mg total) by mouth daily.     mirtazapine 15 MG tablet  Commonly known as:  REMERON  Take 1 tablet (15 mg total) by mouth at bedtime as needed. For sleep     naproxen sodium 220 MG tablet  Commonly known as:  ANAPROX  Take 440 mg by mouth 2 (two) times daily as needed (pain).     nitroGLYCERIN 0.4 MG SL tablet  Commonly known as:  NITROSTAT  Place 1 tablet (0.4 mg total) under the tongue every 5 (five) minutes as needed. For chest pain     omeprazole 20 MG capsule  Commonly known as:  PRILOSEC  Take 20 mg by mouth daily.     prasugrel 10 MG Tabs  Commonly  known as:  EFFIENT  Take 1 tablet (10 mg total) by mouth daily.     rosuvastatin 40 MG tablet  Commonly known as:  CRESTOR  Take 1 tablet (40 mg total) by mouth daily.     traMADol 50 MG tablet  Commonly known as:  ULTRAM  Take 1 tablet (50 mg total) by mouth every 12 (twelve) hours as needed.        Discharge Orders   Future Appointments Provider Department Dept Phone   11/14/2012 11:00 AM Antonieta Iba, MD [facility] at [place] [phone]   12/13/2012 8:00 AM Karie Schwalbe, MD [facility] at [facility] [phone]   Future Orders Complete By Expires     Diet - low sodium heart healthy  As directed     Increase activity slowly  As directed       Follow-up Information   Follow up with Julien Nordmann, MD On 11/14/2012. (at 11:00 am)    Contact information:   [address] [place] [address] [phone]       BRING ALL MEDICATIONS WITH YOU TO FOLLOW UP APPOINTMENTS  Time spent with patient to include physician time: 36 min Signed: Theodore Demark, PA-C 10/26/2012, 8:23 PM Co-Sign MD

## 2012-10-26 NOTE — Progress Notes (Signed)
   Subjective:  Denies CP or dyspnea   Objective:  Filed Vitals:   10/25/12 1651 10/25/12 1736 10/26/12 0000 10/26/12 0600  BP: 128/69 143/72 98/60 119/73  Pulse: 56 51 54 52  Temp:  97.3 F (36.3 C) 97.7 F (36.5 C) 97.4 F (36.3 C)  TempSrc:      Resp: 16 18 18 18   Height:  5\' 5"  (1.651 m)    Weight:  152 lb 12.8 oz (69.31 kg)    SpO2: 99% 99% 99% 98%    Intake/Output from previous day:  Intake/Output Summary (Last 24 hours) at 10/26/12 0720 Last data filed at 10/26/12 0600  Gross per 24 hour  Intake      0 ml  Output   2300 ml  Net  -2300 ml    Physical Exam: Physical exam: Well-developed well-nourished in no acute distress.  Skin is warm and dry.  HEENT is normal.  Neck is supple.  Chest is clear to auscultation with normal expansion.  Cardiovascular exam is regular rate and rhythm.  Abdominal exam nontender or distended. No masses palpated. Extremities show no edema. neuro grossly intact    Lab Results: Basic Metabolic Panel:  Recent Labs  16/10/96 1123  NA 132*  K 3.6  CL 99  CO2 24  GLUCOSE 108*  BUN 9  CREATININE 0.67  CALCIUM 8.6   CBC:  Recent Labs  10/25/12 1123 10/26/12 0605  WBC 6.7 7.0  HGB 13.8 12.7*  HCT 38.4* 36.0*  MCV 91.0 91.1  PLT 225 199   Cardiac Enzymes:  Recent Labs  10/25/12 1805 10/25/12 2315 10/26/12 0605  TROPONINI <0.30 <0.30 <0.30     Assessment/Plan:  1 Chest pain - Enzymes negative; CT shows no PE; for myoview this AM; if negative plan medical therapy; could DC this PM and fu with Dr Mariah Milling. 2 CAD - continue ASA, prasugrel, statin, coreg and nitrates 3 Hyperlipidemia - continue statin. 4 AAA - FU Dr Mariah Milling 5 Hypertension - continue present meds.  Olga Millers 10/26/2012, 7:20 AM

## 2012-10-26 NOTE — Progress Notes (Signed)
Pt. Waiting to be transported back to room after Nuc. Med stress test and started c/o CP =5-6 on 1-10 scale. He stated' "this is the pain I had when I came in". VSS. Placed pt. On monitor which showed Sinus Bradycardia. Notified R. Barrett PA. 650mg  Tyleno given PO. Samantha RN on 3000 also notified. Pt. Transported up to room via w/c.

## 2012-10-26 NOTE — ED Provider Notes (Signed)
Medical screening examination/treatment/procedure(s) were performed by non-physician practitioner and as supervising physician I was immediately available for consultation/collaboration.   Gwyneth Sprout, MD 10/26/12 1526

## 2012-10-26 NOTE — Progress Notes (Signed)
Utilization review completed. Falon Flinchum, RN, BSN. 

## 2012-10-26 NOTE — Progress Notes (Signed)
D/c orders received;IVs removed with gauze on, pt remains in stable condition, pt meds and instructions reviewed and given to pt; pt d/c to home 

## 2012-10-26 NOTE — Progress Notes (Signed)
ANTICOAGULATION CONSULT NOTE - Follow Up Consult  Pharmacy Consult:  Heparin Indication:  ACS  Allergies  Allergen Reactions  . Metoprolol Tartrate Hives and Itching    REACTION: HIVES AND ITCHING    Patient Measurements: Height: 5\' 5"  (165.1 cm) Weight: 152 lb 12.8 oz (69.31 kg) IBW/kg (Calculated) : 61.5 Heparin Dosing Weight: 69 kg  Vital Signs: Temp: 98 F (36.7 C) (07/03 1153) Temp src: Oral (07/03 1153) BP: 135/79 mmHg (07/03 1153) Pulse Rate: 53 (07/03 1153)  Labs:  Recent Labs  10/25/12 1123  10/25/12 1435 10/25/12 1805 10/25/12 2315 10/26/12 0605 10/26/12 1300  HGB 13.8  --   --   --   --  12.7*  --   HCT 38.4*  --   --   --   --  36.0*  --   PLT 225  --   --   --   --  199  --   APTT  --   --  30  --   --   --   --   LABPROT  --   --  12.6  --   --   --   --   INR  --   --  0.96  --   --   --   --   HEPARINUNFRC  --   --   --   --  0.44 0.87* 0.17*  CREATININE 0.67  --   --   --   --  0.67  --   TROPONINI  --   < >  --  <0.30 <0.30 <0.30  --   < > = values in this interval not displayed.  Estimated Creatinine Clearance: 83.3 ml/min (by C-G formula based on Cr of 0.67).      Assessment: 65 YOM admitted with CP to continue on IV heparin.  Heparin level sub-therapeutic post rate decrease.  S/p myoview today and heparin infusion was not interrupted per RN.  No bleeding reported.   Goal of Therapy:  Heparin level 0.3-0.7 units/ml Monitor platelets by anticoagulation protocol: Yes    Plan:  - Increase heparin gtt to 900 units/hr - Check 6 hr HL - Daily HL / CBC     Gentry Pilson D. Laney Potash, PharmD, BCPS Pager:  [phone] 10/26/2012, 2:44 PM

## 2012-10-27 NOTE — Discharge Summary (Signed)
See progress notes Olympia Adelsberger  

## 2012-11-14 ENCOUNTER — Ambulatory Visit (INDEPENDENT_AMBULATORY_CARE_PROVIDER_SITE_OTHER): Payer: 59 | Admitting: Cardiovascular Disease

## 2012-11-14 ENCOUNTER — Encounter: Payer: Self-pay | Admitting: Cardiovascular Disease

## 2012-11-14 VITALS — BP 124/84 | HR 65 | Ht 65.0 in | Wt 149.1 lb

## 2012-11-14 DIAGNOSIS — I10 Essential (primary) hypertension: Secondary | ICD-10-CM

## 2012-11-14 DIAGNOSIS — I200 Unstable angina: Secondary | ICD-10-CM

## 2012-11-14 DIAGNOSIS — I701 Atherosclerosis of renal artery: Secondary | ICD-10-CM

## 2012-11-14 DIAGNOSIS — R0789 Other chest pain: Secondary | ICD-10-CM

## 2012-11-14 DIAGNOSIS — I2510 Atherosclerotic heart disease of native coronary artery without angina pectoris: Secondary | ICD-10-CM

## 2012-11-14 DIAGNOSIS — E785 Hyperlipidemia, unspecified: Secondary | ICD-10-CM

## 2012-11-14 MED ORDER — TRAMADOL HCL 50 MG PO TABS: 50.0000 mg | ORAL_TABLET | Freq: Two times a day (BID) | ORAL | Status: DC | PRN

## 2012-11-14 NOTE — Assessment & Plan Note (Signed)
Cholesterol is at goal on the current lipid regimen. No changes to the medications were made.  

## 2012-11-14 NOTE — Progress Notes (Signed)
Patient ID: [name], male    DOB: 04-03-1951, 62 y.o.   MRN: [id number]  HPI Comments: [name] is a 62 year old gentleman with coronary artery disease, bypass surgery in 1995, stenting at [facility] in February 2004 for MI with a 3.0 x 25 mm and 4.5 x 16 mm Monorail ( location uncertain), also 4.5 x 18 mm stent placed to the mid RCA, followup with repeat stenting in April 2005 to the proximal LAD with a Taxus stent 2.5 x 8 mm, and stenting to the proximal left circumflex with a 2.5 x 12 mm Taxus, with recent stent placed November 13, 2010 with a 4.0 x 9 mm stent placed to the left main. He presents for routine followup.  Previous episode of chest pain while in [place] with hospitalization at that time, negative stress test, 2013 recurrent chest pain requiring admission to [facility], repeat catheterization performed 12/2011 This showed patent stents to the left circumflex and LAD with patent LIMA to the LAD. Vein graft to the OM and vein graft to the RCA was occluded He was started on isosorbide 30 mg daily in the hospital  Recent admission to [facility] at the beginning of July 2014. Notes from the hospital were reviewed. Ruled out for MI with negative cardiac enzymes, CT scan chest showing no PE. He had stress test 10/25/2012 showing old inferior wall MI with defect noted in the basal to mid inferior wall. Discharged on tramadol.  He's not taking tramadol on a regular basis since his discharge. He continues to have mild shortness chest pain episodes. Is otherwise active with no restrictions. Uncertain if chest pain comes on with exertion or at rest. This has been the same chronic chest pain that is at the left of his sternum. He has had the same symptoms for years.  Previously tried Ranexa but had to stop as this cost too much money.  Ultrasound of his aorta in [place] 12/22/2011 showed distal aorta 3 cm, iliacs of normal size Previous lower extremity ultrasound  showed no  significant disease  He used to be a smoker though stopped in 1995. Prior to that he smoked 3 packs per day  EKG shows normal sinus rhythm with rate 65 beats per minute with no significant ST or T wave changes         Outpatient Encounter Prescriptions as of 11/14/2012  Medication Sig Dispense Refill  . aspirin 81 MG tablet Take 1 tablet (81 mg total) by mouth daily.      . carvedilol (COREG) 3.125 MG tablet Take 1 tablet (3.125 mg total) by mouth 2 (two) times daily.  60 tablet  11  . ezetimibe (ZETIA) 10 MG tablet Take 1 tablet (10 mg total) by mouth daily.  90 tablet  3  . isosorbide mononitrate (IMDUR) 30 MG 24 hr tablet Take 1 tablet (30 mg total) by mouth daily.  30 tablet  6  . lisinopril (PRINIVIL,ZESTRIL) 5 MG tablet Take 1 tablet (5 mg total) by mouth daily.  30 tablet  6  . mirtazapine (REMERON) 15 MG tablet Take 1 tablet (15 mg total) by mouth at bedtime as needed. For sleep  90 tablet  3  . naproxen sodium (ANAPROX) 220 MG tablet Take 440 mg by mouth 2 (two) times daily as needed (pain).      . nitroGLYCERIN (NITROSTAT) 0.4 MG SL tablet Place 1 tablet (0.4 mg total) under the tongue every 5 (five) minutes as needed. For chest pain  25  tablet  1  . omeprazole (PRILOSEC) 20 MG capsule Take 20 mg by mouth daily.       . prasugrel (EFFIENT) 10 MG TABS Take 1 tablet (10 mg total) by mouth daily.  30 tablet  6  . rosuvastatin (CRESTOR) 40 MG tablet Take 1 tablet (40 mg total) by mouth daily.  30 tablet  6  . traMADol (ULTRAM) 50 MG tablet Take 1 tablet (50 mg total) by mouth every 12 (twelve) hours as needed.  20 tablet  0   No facility-administered encounter medications on file as of 11/14/2012.     Review of Systems  Constitutional: Negative.   HENT: Negative.   Eyes: Negative.   Respiratory: Negative.   Cardiovascular: Negative.   Gastrointestinal: Negative.   Musculoskeletal: Positive for back pain and gait problem.       Bilateral leg pain  Skin: Negative.    Neurological: Negative.   Psychiatric/Behavioral: Negative.   All other systems reviewed and are negative.    BP 124/84  Pulse 65  Ht 5\' 5"  (1.651 m)  Wt 149 lb 1.9 oz (67.64 kg)  BMI 24.81 kg/m2   Physical Exam  Nursing note and vitals reviewed. Constitutional: He is oriented to person, place, and time. He appears well-developed and well-nourished.  HENT:  Head: Normocephalic.  Nose: Nose normal.  Mouth/Throat: Oropharynx is clear and moist.  Eyes: Conjunctivae are normal. Pupils are equal, round, and reactive to light.  Neck: Normal range of motion. Neck supple. No JVD present.  Cardiovascular: Normal rate, regular rhythm, S1 normal, S2 normal, normal heart sounds and intact distal pulses.  Exam reveals no gallop and no friction rub.   No murmur heard. Pulmonary/Chest: Effort normal and breath sounds normal. No respiratory distress. He has no wheezes. He has no rales. He exhibits no tenderness.  Abdominal: Soft. Bowel sounds are normal. He exhibits no distension. There is no tenderness.  Musculoskeletal: Normal range of motion. He exhibits no edema and no tenderness.  Lymphadenopathy:    He has no cervical adenopathy.  Neurological: He is alert and oriented to person, place, and time. Coordination normal.  Skin: Skin is warm and dry. No rash noted. No erythema.  Psychiatric: He has a normal mood and affect. His behavior is normal. Judgment and thought content normal.      Assessment and Plan

## 2012-11-14 NOTE — Assessment & Plan Note (Signed)
Blood pressure is well controlled on today's visit. No changes made to the medications. 

## 2012-11-14 NOTE — Assessment & Plan Note (Signed)
Chest pain symptoms concerning for musculoskeletal even the exact location though unable to definitively exclude angina. Again if symptoms get worse, cardiac catheterization could be performed.

## 2012-11-14 NOTE — Assessment & Plan Note (Addendum)
Recent negative stress test. No further workup at this time. If he has escalating severe chest pain symptoms, we would offer cardiac catheterization. He is nervous about his chest pain symptoms despite the fact that these have been chronic. He we'll try tramadol 50 mg twice a day for one to 2 weeks. If no improvement of his symptoms, we could try ranexa again twice a day.

## 2012-11-14 NOTE — Assessment & Plan Note (Signed)
Will need periodic ultrasound of his renal artery. Scheduled for next year

## 2012-11-14 NOTE — Patient Instructions (Addendum)
You are doing well. No medication changes were made.  Please call if you would like ranexa for chest pain If chest pain gets worse, call the office  Please call us if you have new issues that need to be addressed before your next appt.  Your physician wants you to follow-up in: 3 months.  You will receive a reminder letter in the mail two months in advance. If you don't receive a letter, please call our office to schedule the follow-up appointment.

## 2012-11-28 ENCOUNTER — Other Ambulatory Visit: Payer: Self-pay | Admitting: *Deleted

## 2012-11-28 MED ORDER — PRASUGREL HCL 10 MG PO TABS: 10.0000 mg | ORAL_TABLET | Freq: Every day | ORAL | Status: DC

## 2012-12-13 ENCOUNTER — Ambulatory Visit (INDEPENDENT_AMBULATORY_CARE_PROVIDER_SITE_OTHER): Payer: 59 | Admitting: Internal Medicine

## 2012-12-13 ENCOUNTER — Encounter: Payer: Self-pay | Admitting: Internal Medicine

## 2012-12-13 ENCOUNTER — Telehealth: Payer: Self-pay | Admitting: *Deleted

## 2012-12-13 VITALS — BP 120/80 | HR 68 | Temp 98.0°F | Wt 147.0 lb

## 2012-12-13 DIAGNOSIS — K219 Gastro-esophageal reflux disease without esophagitis: Secondary | ICD-10-CM

## 2012-12-13 DIAGNOSIS — I10 Essential (primary) hypertension: Secondary | ICD-10-CM

## 2012-12-13 DIAGNOSIS — Z0000 Encounter for general adult medical examination without abnormal findings: Secondary | ICD-10-CM

## 2012-12-13 DIAGNOSIS — E785 Hyperlipidemia, unspecified: Secondary | ICD-10-CM

## 2012-12-13 DIAGNOSIS — I2510 Atherosclerotic heart disease of native coronary artery without angina pectoris: Secondary | ICD-10-CM

## 2012-12-13 DIAGNOSIS — G479 Sleep disorder, unspecified: Secondary | ICD-10-CM

## 2012-12-13 DIAGNOSIS — Z23 Encounter for immunization: Secondary | ICD-10-CM

## 2012-12-13 NOTE — Addendum Note (Signed)
Addended by: Sueanne Margarita on: 12/13/2012 09:18 AM   Modules accepted: Orders

## 2012-12-13 NOTE — Telephone Encounter (Signed)
Message copied by Sueanne Margarita on Wed Dec 13, 2012  2:39 PM ------      Message from: [name] I      Created: Wed Dec 13, 2012  1:48 PM       Dee,            Please call him and let him know that Dr Mariah Milling agrees that he can stop the zetia.      Please take it off his list            [name]      ----- Message -----         From: Antonieta Iba, MD         Sent: 12/13/2012   8:45 AM           To: Karie Schwalbe, MD            Would agree with holding the zetia.      It has dropped significantly in the past year, in half.      wow      thx      [name]            ----- Message -----         From: Karie Schwalbe, MD         Sent: 12/13/2012   8:38 AM           To: Antonieta Iba, MD            [name],      Can he stop his zetia?      Trying to simplify regimen and not sure he needs this with LDL of 50      [name]             ------

## 2012-12-13 NOTE — Telephone Encounter (Signed)
Spoke with patient's wife and advised results Med list updated

## 2012-12-13 NOTE — Assessment & Plan Note (Signed)
Controlled with PPI

## 2012-12-13 NOTE — Progress Notes (Signed)
Subjective:    Patient ID: [name], male    DOB: 03-02-1951, 62 y.o.   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 aspirin 81 MG tablet Take 1 tablet (81 mg total) by mouth daily.      . carvedilol (COREG) 3.125 MG tablet Take 1 tablet (3.125 mg total) by mouth 2 (two) times daily.  60 tablet  11  . ezetimibe (ZETIA) 10 MG tablet Take 1 tablet (10 mg total) by mouth daily.  90 tablet  3  . isosorbide mononitrate (IMDUR) 30 MG 24 hr tablet Take 1 tablet (30 mg total) by mouth daily.  30 tablet  6  . lisinopril (PRINIVIL,ZESTRIL) 5 MG tablet Take 1 tablet (5 mg total) by mouth daily.  30 tablet  6  . mirtazapine (REMERON) 15 MG tablet Take 1 tablet (15 mg total) by mouth at bedtime as needed. For sleep  90 tablet  3  . naproxen sodium (ANAPROX) 220 MG tablet Take 440 mg by mouth 2 (two) times daily as needed (pain).      . nitroGLYCERIN (NITROSTAT) 0.4 MG SL tablet Place 1 tablet (0.4 mg total) under the tongue every 5 (five) minutes as needed. For chest pain  25 tablet  1  . omeprazole (PRILOSEC) 20 MG capsule Take 20 mg by mouth daily.       . prasugrel (EFFIENT) 10 MG TABS tablet Take 1 tablet (10 mg total) by mouth daily.  30 tablet  2  . rosuvastatin (CRESTOR) 40 MG tablet Take 1 tablet (40 mg total) by mouth daily.  30 tablet  6  . traMADol (ULTRAM) 50 MG tablet Take 1 tablet (50 mg total) by mouth every 12 (twelve) hours as needed.  60 tablet  1   No current  facility-administered medications on file prior to visit.    Allergies  Allergen Reactions  . Metoprolol Tartrate Hives and Itching    REACTION: HIVES AND ITCHING    Past Medical History  Diagnosis Date  . CAD (coronary artery disease)     a. 1995 s/p CABG x 4: LIMA->LAD, VG->RI (known to be occluded), VG->AM->PDA;  b. 05/2002 Inf STEMI: VG->AM->PDA 100% treated w/ 2 BMS complicated by acute thrombosis req 3 BMS;  c. 07/2003 DES to  LAD & LCX (VG's to PDA & RI 100%);  d. 12/2010 Acute MI ([place]): DES to LCX & LM , LIMA ok,;  e.12/2011 Cath: LM/LCX stents ok , LIMA patent.  Marland Kitchen GERD (gastroesophageal reflux disease)   . Hyperlipidemia   . Hypertension   . Diverticulitis     1/06 Diverticulitis--CT of pelvis--diffuse sigmoid divertic  . AAA (abdominal aortic aneurysm)     a. Duplex 01/2012: stable infrarenal saccular AAA at 3.3cm x 3.2xm (f/u recommended 01/2013 per Dr. Mariah Milling)  . Renal artery stenosis     a. 03/2011 PTA and stenting of L RA. b. 01/2012 renal duplex - stable 1-59% R renal artery stenosis, normal L renal artery s/p stent.  Marland Kitchen  Back pain   . Myocardial infarction   . Anginal pain   . Shortness of breath     Past Surgical History  Procedure Laterality Date  . Coronary artery bypass graft    . Closed reduction shoulder dislocation    . Angioplasty  1997  . Coronary stent placement  7/12    2 stents--Promus element plus (everolimus eluting)--[facility] in [place]  . Cardiac catheterization      8/09  Cath--vein graft occlusions which are old--no acute changes  . Cardiac catheterization  11/13/2010    stent x 2 @ [place]  . Renal artery stent  03/2011 ?    Family History  Problem Relation Age of Onset  . Coronary artery disease Mother     Died MI age 47  . Hypertension Mother   . Coronary artery disease Sister     Living  . Coronary artery disease Brother     Living  . Diabetes Neg Hx   . Cancer Neg Hx     prostate or colon  . Coronary artery disease  Father     Died MI age 24    History   Social History  . Marital Status: Married    Spouse Name: N/A    Number of Children: 3  . Years of Education: N/A   Occupational History  . Merchandiser, retail (Restaurant manager, fast food)     own business now   Social History Main Topics  . Smoking status: Former Smoker -- 3.00 packs/day for 25 years    Types: Cigarettes    Quit date: 05/28/1993  . Smokeless tobacco: Never Used  . Alcohol Use: 10.8 oz/week    18 Cans of beer per week     Comment: every day - 3-6 beers  . Drug Use: No  . Sexual Activity: Not on file   Other Topics Concern  . Not on file   Social History Narrative  . No narrative on file   Review of Systems Sleeps fairly well Nocturia x 1 No daytime urinary symptoms Appetite is okay Weight is steady     Objective:   Physical Exam  Constitutional: He appears well-developed and well-nourished. No distress.  Neck: Normal range of motion. Neck supple. No thyromegaly present.  Cardiovascular: Normal rate, regular rhythm, normal heart sounds and intact distal pulses.  Exam reveals no gallop.   No murmur heard. Pulmonary/Chest: Effort normal and breath sounds normal. No respiratory distress. He has no wheezes. He has no rales.  Abdominal: Soft. There is no tenderness.  Musculoskeletal: He exhibits no edema and no tenderness.  Lymphadenopathy:    He has no cervical adenopathy.  Psychiatric: He has a normal mood and affect. His behavior is normal.          Assessment & Plan:

## 2012-12-13 NOTE — Assessment & Plan Note (Signed)
Discussed cancer screening--he prefers to hold off Tdap today

## 2012-12-13 NOTE — Assessment & Plan Note (Signed)
Uses mirtazapine intermittently Seems to help

## 2012-12-13 NOTE — Assessment & Plan Note (Signed)
BP Readings from Last 3 Encounters:  12/13/12 120/80  11/14/12 124/84  10/26/12 135/79   Good control

## 2012-12-13 NOTE — Assessment & Plan Note (Signed)
Has what seems like stable angina pattern but is better with tramadol No further action for now

## 2012-12-13 NOTE — Assessment & Plan Note (Addendum)
Very low on Rx Just checked LDL 50 I think the zetia is not really needed---will check with Dr Mariah Milling

## 2013-02-02 ENCOUNTER — Other Ambulatory Visit: Payer: Self-pay | Admitting: Internal Medicine

## 2013-02-03 NOTE — Telephone Encounter (Signed)
Okay to refill both for a year 

## 2013-02-05 NOTE — Telephone Encounter (Signed)
rx sent to pharmacy by e-script  

## 2013-02-16 ENCOUNTER — Ambulatory Visit (INDEPENDENT_AMBULATORY_CARE_PROVIDER_SITE_OTHER): Payer: 59 | Admitting: Cardiovascular Disease

## 2013-02-16 ENCOUNTER — Encounter: Payer: Self-pay | Admitting: Cardiovascular Disease

## 2013-02-16 VITALS — BP 120/88 | HR 77 | Ht 65.0 in | Wt 152.2 lb

## 2013-02-16 DIAGNOSIS — I10 Essential (primary) hypertension: Secondary | ICD-10-CM

## 2013-02-16 DIAGNOSIS — I701 Atherosclerosis of renal artery: Secondary | ICD-10-CM

## 2013-02-16 DIAGNOSIS — I200 Unstable angina: Secondary | ICD-10-CM

## 2013-02-16 DIAGNOSIS — E785 Hyperlipidemia, unspecified: Secondary | ICD-10-CM

## 2013-02-16 DIAGNOSIS — I714 Abdominal aortic aneurysm, without rupture, unspecified: Secondary | ICD-10-CM

## 2013-02-16 DIAGNOSIS — I2510 Atherosclerotic heart disease of native coronary artery without angina pectoris: Secondary | ICD-10-CM

## 2013-02-16 NOTE — Assessment & Plan Note (Signed)
Blood pressure is well controlled on today's visit. No changes made to the medications. 

## 2013-02-16 NOTE — Patient Instructions (Signed)
You are doing well. No medication changes were made.  Please call us if you have new issues that need to be addressed before your next appt.  Your physician wants you to follow-up in: 6 months.  You will receive a reminder letter in the mail two months in advance. If you don't receive a letter, please call our office to schedule the follow-up appointment.   

## 2013-02-16 NOTE — Assessment & Plan Note (Signed)
Significant past coronary history. Currently with no pain. Active at baseline. We'll need to continue aggressive medical management

## 2013-02-16 NOTE — Assessment & Plan Note (Signed)
On today's visit, no significant chest pain. We'll continue his current medication regimen. No further testing

## 2013-02-16 NOTE — Assessment & Plan Note (Signed)
Cholesterol is at goal on the current lipid regimen. No changes to the medications were made.  

## 2013-02-16 NOTE — Progress Notes (Signed)
Patient ID: [name], male    DOB: 10-25-1950, 62 y.o.   MRN: [id number]  HPI Comments: [name] is a 62 year old gentleman with coronary artery disease, bypass surgery in 1995, stenting at [facility] in February 2004 for MI with a 3.0 x 25 mm and 4.5 x 16 mm Monorail ( location uncertain), also 4.5 x 18 mm stent placed to the mid RCA, followup with repeat stenting in April 2005 to the proximal LAD with a Taxus stent 2.5 x 8 mm, and stenting to the proximal left circumflex with a 2.5 x 12 mm Taxus, with recent stent placed November 13, 2010 with a 4.0 x 9 mm stent placed to the left main. He presents for routine followup. History of renal artery stent  Previous episode of chest pain while in [place] with hospitalization at that time, negative stress test, 2013 recurrent chest pain requiring admission to [facility], repeat catheterization performed 12/2011 This showed patent stents to the left circumflex and LAD with patent LIMA to the LAD. Vein graft to the OM and vein graft to the RCA was occluded He was started on isosorbide 30 mg daily in the hospital  admission to [facility] at the beginning of July 2014. Notes from the hospital were reviewed. Ruled out for MI with negative cardiac enzymes, CT scan chest showing no PE. He had stress test 10/25/2012 showing old inferior wall MI with defect noted in the basal to mid inferior wall. Discharged on tramadol.   In followup today, he reports that he is doing well. He is active, denies any significant shortness of breath or chest pain. No coughing, PND or leg swelling .  no restrictions. He is no longer taking tramadol for chest pain. He does report that he has back pain and takes Aleve every 2 weeks Previously tried Ranexa but had to stop as this cost too much money.  Ultrasound of his aorta in [place] 12/22/2011 showed distal aorta 3 cm, iliacs of normal size Previous lower extremity ultrasound  showed no significant disease  He used  to be a smoker though stopped in 1995. Prior to that he smoked 3 packs per day. Smoked for approximately 25-28 years Recent lab work showed total cholesterol 106, LDL 50  EKG shows normal sinus rhythm with rate 77 beats per minute with no significant ST or T wave changes         Outpatient Encounter Prescriptions as of 02/16/2013  Medication Sig Dispense Refill  . aspirin 81 MG tablet Take 1 tablet (81 mg total) by mouth daily.      . carvedilol (COREG) 3.125 MG tablet TAKE ONE (1) TABLET BY MOUTH TWO (2) TIMES DAILY  180 tablet  3  . isosorbide mononitrate (IMDUR) 30 MG 24 hr tablet Take 1 tablet (30 mg total) by mouth daily.  30 tablet  6  . lisinopril (PRINIVIL,ZESTRIL) 5 MG tablet Take 1 tablet (5 mg total) by mouth daily.  30 tablet  6  . mirtazapine (REMERON) 15 MG tablet TAKE 1 TABLET BY MOUTH EVERY NIGHT AT BEDTIME AS NEEDED. FOR SLEEP  90 tablet  3  . naproxen sodium (ANAPROX) 220 MG tablet Take 440 mg by mouth 2 (two) times daily as needed (pain).      . nitroGLYCERIN (NITROSTAT) 0.4 MG SL tablet Place 1 tablet (0.4 mg total) under the tongue every 5 (five) minutes as needed. For chest pain  25 tablet  1  . omeprazole (PRILOSEC) 20 MG capsule Take 20  mg by mouth daily.       . prasugrel (EFFIENT) 10 MG TABS tablet Take 1 tablet (10 mg total) by mouth daily.  30 tablet  2  . rosuvastatin (CRESTOR) 40 MG tablet Take 1 tablet (40 mg total) by mouth daily.  30 tablet  6  . [DISCONTINUED] traMADol (ULTRAM) 50 MG tablet Take 1 tablet (50 mg total) by mouth every 12 (twelve) hours as needed.  60 tablet  1   No facility-administered encounter medications on file as of 02/16/2013.     Review of Systems  Constitutional: Negative.   HENT: Negative.   Eyes: Negative.   Respiratory: Negative.   Cardiovascular: Negative.   Gastrointestinal: Negative.   Endocrine: Negative.   Musculoskeletal: Positive for back pain and gait problem.       Bilateral leg pain  Skin: Negative.    Allergic/Immunologic: Negative.   Neurological: Negative.   Hematological: Negative.   Psychiatric/Behavioral: Negative.   All other systems reviewed and are negative.    BP 120/88  Pulse 77  Ht 5\' 5"  (1.651 m)  Wt 152 lb 4 oz (69.06 kg)  BMI 25.34 kg/m2   Physical Exam  Nursing note and vitals reviewed. Constitutional: He is oriented to person, place, and time. He appears well-developed and well-nourished.  HENT:  Head: Normocephalic.  Nose: Nose normal.  Mouth/Throat: Oropharynx is clear and moist.  Eyes: Conjunctivae are normal. Pupils are equal, round, and reactive to light.  Neck: Normal range of motion. Neck supple. No JVD present.  Cardiovascular: Normal rate, regular rhythm, S1 normal, S2 normal, normal heart sounds and intact distal pulses.  Exam reveals no gallop and no friction rub.   No murmur heard. Pulmonary/Chest: Effort normal and breath sounds normal. No respiratory distress. He has no wheezes. He has no rales. He exhibits no tenderness.  Abdominal: Soft. Bowel sounds are normal. He exhibits no distension. There is no tenderness.  Musculoskeletal: Normal range of motion. He exhibits no edema and no tenderness.  Lymphadenopathy:    He has no cervical adenopathy.  Neurological: He is alert and oriented to person, place, and time. Coordination normal.  Skin: Skin is warm and dry. No rash noted. No erythema.  Psychiatric: He has a normal mood and affect. His behavior is normal. Judgment and thought content normal.      Assessment and Plan

## 2013-02-16 NOTE — Assessment & Plan Note (Signed)
We'll repeat annual renal artery stent ultrasound

## 2013-03-01 ENCOUNTER — Other Ambulatory Visit: Payer: Self-pay

## 2013-03-06 ENCOUNTER — Other Ambulatory Visit: Payer: Self-pay

## 2013-03-06 MED ORDER — ISOSORBIDE MONONITRATE ER 30 MG PO TB24: 30.0000 mg | ORAL_TABLET | Freq: Every day | ORAL | Status: DC

## 2013-03-06 MED ORDER — LISINOPRIL 5 MG PO TABS: 5.0000 mg | ORAL_TABLET | Freq: Every day | ORAL | Status: DC

## 2013-03-06 NOTE — Telephone Encounter (Signed)
Refill sent for lisinopril  

## 2013-03-06 NOTE — Telephone Encounter (Signed)
Refill sent for isosorbide mono 30 mg  

## 2013-03-07 ENCOUNTER — Other Ambulatory Visit: Payer: Self-pay

## 2013-03-07 MED ORDER — PRASUGREL HCL 10 MG PO TABS: 10.0000 mg | ORAL_TABLET | Freq: Every day | ORAL | Status: DC

## 2013-03-08 ENCOUNTER — Encounter (INDEPENDENT_AMBULATORY_CARE_PROVIDER_SITE_OTHER): Payer: 59

## 2013-03-08 DIAGNOSIS — I701 Atherosclerosis of renal artery: Secondary | ICD-10-CM

## 2013-05-08 ENCOUNTER — Other Ambulatory Visit: Payer: Self-pay | Admitting: *Deleted

## 2013-05-08 MED ORDER — ROSUVASTATIN CALCIUM 40 MG PO TABS: 40.0000 mg | ORAL_TABLET | Freq: Every day | ORAL | Status: DC

## 2013-05-08 NOTE — Telephone Encounter (Signed)
Requested Prescriptions   Signed Prescriptions Disp Refills  . rosuvastatin (CRESTOR) 40 MG tablet 30 tablet 3    Sig: Take 1 tablet (40 mg total) by mouth daily.    Authorizing Provider: GOLLAN, TIMOTHY J    Ordering User: Awesome Jared C    

## 2013-05-28 ENCOUNTER — Encounter: Payer: Self-pay | Admitting: Family Medicine

## 2013-05-28 ENCOUNTER — Ambulatory Visit (INDEPENDENT_AMBULATORY_CARE_PROVIDER_SITE_OTHER): Payer: 59 | Admitting: Family Medicine

## 2013-05-28 VITALS — BP 104/76 | HR 76 | Temp 98.0°F | Ht 65.0 in | Wt 148.2 lb

## 2013-05-28 DIAGNOSIS — J069 Acute upper respiratory infection, unspecified: Secondary | ICD-10-CM

## 2013-05-28 DIAGNOSIS — B9789 Other viral agents as the cause of diseases classified elsewhere: Principal | ICD-10-CM

## 2013-05-28 MED ORDER — HYDROCODONE-HOMATROPINE 5-1.5 MG/5ML PO SYRP
5.0000 mL | ORAL_SOLUTION | Freq: Every evening | ORAL | Status: DC | PRN
Start: 2013-05-28 — End: 2013-09-20

## 2013-05-28 NOTE — Progress Notes (Signed)
Pre-visit discussion using our clinic review tool. No additional management support is needed unless otherwise documented below in the visit note.  

## 2013-05-28 NOTE — Assessment & Plan Note (Signed)
Cough suppressant, symptomatic care.

## 2013-05-28 NOTE — Progress Notes (Signed)
   Subjective:    Patient ID: [name], male    DOB: 10-05-1950, 63 y.o.   MRN: [id number]  Sore Throat  This is a new problem. The current episode started in the past 7 days. The problem has been gradually worsening. Sore throat worse side: feels like there is some thing stuck in his thraot that is making him cough., coughing fits. There has been no fever. Associated symptoms include coughing, headaches, swollen glands and trouble swallowing. Pertinent negatives include no abdominal pain, drooling, ear discharge, ear pain, plugged ear sensation, neck pain, shortness of breath or vomiting. Associated symptoms comments: Dry cough, if he does cough small mucus out... Throat feeling resolves for a short while Has runny nose. He has had no exposure to strep or mono. Treatments tried: coricidin. The treatment provided mild relief.    Longtime extensive former smoker, no dx copd or emphesema  Review of Systems  HENT: Positive for trouble swallowing. Negative for drooling, ear discharge and ear pain.   Respiratory: Positive for cough. Negative for shortness of breath.   Gastrointestinal: Negative for vomiting and abdominal pain.  Musculoskeletal: Negative for neck pain.  Neurological: Positive for headaches.       Objective:   Physical Exam  Constitutional: Vital signs are normal. He appears well-developed and well-nourished.  Non-toxic appearance. He does not appear ill. No distress.  HENT:  Head: Normocephalic and atraumatic.  Right Ear: Hearing, tympanic membrane, external ear and ear canal normal. No tenderness. No foreign bodies. Tympanic membrane is not retracted and not bulging.  Left Ear: Hearing, tympanic membrane, external ear and ear canal normal. No tenderness. No foreign bodies. Tympanic membrane is not retracted and not bulging.  Nose: Nose normal. No mucosal edema or rhinorrhea. Right sinus exhibits no maxillary sinus tenderness and no frontal sinus tenderness. Left sinus  exhibits no maxillary sinus tenderness and no frontal sinus tenderness.  Mouth/Throat: Uvula is midline and mucous membranes are normal. Normal dentition. No dental caries. Posterior oropharyngeal erythema present. No oropharyngeal exudate or tonsillar abscesses.  Post nasal drip in throat.. No mass,no swelling  Eyes: Conjunctivae, EOM and lids are normal. Pupils are equal, round, and reactive to light. Lids are everted and swept, no foreign bodies found.  Neck: Trachea normal, normal range of motion and phonation normal. Neck supple. Carotid bruit is not present. No mass and no thyromegaly present.  Cardiovascular: Normal rate, regular rhythm, S1 normal, S2 normal, normal heart sounds, intact distal pulses and normal pulses.  Exam reveals no gallop.   No murmur heard. Pulmonary/Chest: Effort normal and breath sounds normal. No respiratory distress. He has no wheezes. He has no rhonchi. He has no rales.  Abdominal: Soft. Normal appearance and bowel sounds are normal. There is no hepatosplenomegaly. There is no tenderness. There is no rebound, no guarding and no CVA tenderness. No hernia.  Lymphadenopathy:    He has cervical adenopathy.       Right cervical: Superficial cervical adenopathy present.       Left cervical: Superficial cervical adenopathy present.  Neurological: He is alert. He has normal reflexes.  Skin: Skin is warm, dry and intact. No rash noted.  Psychiatric: He has a normal mood and affect. His speech is normal and behavior is normal. Judgment normal.          Assessment & Plan:

## 2013-05-28 NOTE — Patient Instructions (Addendum)
Mucinex DM (not decongestant) during the day. Cough suppressant at night. Call if shortness of breath increasing, or new fever. Expect 7-10 days of illness, sometimes more.

## 2013-06-09 ENCOUNTER — Emergency Department (HOSPITAL_COMMUNITY)
Admission: EM | Admit: 2013-06-09 | Discharge: 2013-06-10 | Disposition: A | Discharge status: Home / Self Care | Payer: 59 | Attending: Emergency Medicine | Admitting: Emergency Medicine

## 2013-06-09 ENCOUNTER — Encounter (HOSPITAL_COMMUNITY): Payer: Self-pay | Admitting: Emergency Medicine

## 2013-06-09 DIAGNOSIS — Z87891 Personal history of nicotine dependence: Secondary | ICD-10-CM | POA: Insufficient documentation

## 2013-06-09 DIAGNOSIS — I2510 Atherosclerotic heart disease of native coronary artery without angina pectoris: Secondary | ICD-10-CM | POA: Insufficient documentation

## 2013-06-09 DIAGNOSIS — H9209 Otalgia, unspecified ear: Secondary | ICD-10-CM | POA: Insufficient documentation

## 2013-06-09 DIAGNOSIS — Z9861 Coronary angioplasty status: Secondary | ICD-10-CM | POA: Insufficient documentation

## 2013-06-09 DIAGNOSIS — K0889 Other specified disorders of teeth and supporting structures: Secondary | ICD-10-CM

## 2013-06-09 DIAGNOSIS — Z7982 Long term (current) use of aspirin: Secondary | ICD-10-CM | POA: Insufficient documentation

## 2013-06-09 DIAGNOSIS — I10 Essential (primary) hypertension: Secondary | ICD-10-CM | POA: Insufficient documentation

## 2013-06-09 DIAGNOSIS — I209 Angina pectoris, unspecified: Secondary | ICD-10-CM | POA: Insufficient documentation

## 2013-06-09 DIAGNOSIS — I252 Old myocardial infarction: Secondary | ICD-10-CM | POA: Insufficient documentation

## 2013-06-09 DIAGNOSIS — K006 Disturbances in tooth eruption: Secondary | ICD-10-CM | POA: Insufficient documentation

## 2013-06-09 DIAGNOSIS — K029 Dental caries, unspecified: Secondary | ICD-10-CM | POA: Insufficient documentation

## 2013-06-09 DIAGNOSIS — Z951 Presence of aortocoronary bypass graft: Secondary | ICD-10-CM | POA: Insufficient documentation

## 2013-06-09 DIAGNOSIS — K089 Disorder of teeth and supporting structures, unspecified: Secondary | ICD-10-CM | POA: Insufficient documentation

## 2013-06-09 DIAGNOSIS — Z79899 Other long term (current) drug therapy: Secondary | ICD-10-CM | POA: Insufficient documentation

## 2013-06-09 DIAGNOSIS — K219 Gastro-esophageal reflux disease without esophagitis: Secondary | ICD-10-CM | POA: Insufficient documentation

## 2013-06-09 DIAGNOSIS — E785 Hyperlipidemia, unspecified: Secondary | ICD-10-CM | POA: Insufficient documentation

## 2013-06-09 MED ORDER — HYDROCODONE-ACETAMINOPHEN 5-325 MG PO TABS
1.0000 | ORAL_TABLET | Freq: Once | ORAL | Status: AC
  Administered 2013-06-10: 1 via ORAL
  Filled 2013-06-09: qty 1

## 2013-06-09 MED ORDER — HYDROCODONE-ACETAMINOPHEN 5-325 MG PO TABS: 1.0000 | ORAL_TABLET | Freq: Four times a day (QID) | ORAL | Status: DC | PRN

## 2013-06-09 MED ORDER — PENICILLIN V POTASSIUM 500 MG PO TABS: 500.0000 mg | ORAL_TABLET | Freq: Four times a day (QID) | ORAL | Status: DC

## 2013-06-09 NOTE — Discharge Instructions (Signed)
Please follow up with your primary care physician in 1-2 days. If you do not have one please call the [facility] number listed above. Please follow up with the dentist to schedule a follow up appointment. Please take pain medication as prescribed and as needed for pain. Please do not drive on narcotic pain medication. Please take your antibiotic until completion. Please read all discharge instructions and return precautions.    Dental Pain A tooth ache may be caused by cavities (tooth decay). Cavities expose the nerve of the tooth to air and hot or cold temperatures. It may come from an infection or abscess (also called a boil or furuncle) around your tooth. It is also often caused by dental caries (tooth decay). This causes the pain you are having. DIAGNOSIS  Your caregiver can diagnose this problem by exam. TREATMENT   If caused by an infection, it may be treated with medications which kill germs (antibiotics) and pain medications as prescribed by your caregiver. Take medications as directed.  Only take over-the-counter or prescription medicines for pain, discomfort, or fever as directed by your caregiver.  Whether the tooth ache today is caused by infection or dental disease, you should see your dentist as soon as possible for further care. SEEK MEDICAL CARE IF: The exam and treatment you received today has been provided on an emergency basis only. This is not a substitute for complete medical or dental care. If your problem worsens or new problems (symptoms) appear, and you are unable to meet with your dentist, call or return to this location. SEEK IMMEDIATE MEDICAL CARE IF:   You have a fever.  You develop redness and swelling of your face, jaw, or neck.  You are unable to open your mouth.  You have severe pain uncontrolled by pain medicine. MAKE SURE YOU:   Understand these instructions.  Will watch your condition.  Will get help right away if you are not  doing well or get worse. Document Released: 04/12/2005 Document Revised: 07/05/2011 Document Reviewed: 11/29/2007 [facility] Patient Information 2014 [place].  RESOURCE GUIDE  If you do not have a primary care doctor to follow up with regarding today's visit, please call the [facility] at [phone] to make an appointment. Hours of operation are 10am - 7pm, Monday through Friday, and they have a sliding scale fee.    Dental Assistance Please contact the on-call dentist listed on your discharge papers WITHIN 48 HOURS.  If the on-call dentist agrees that your condition is emergent, there is a CHANCE (not a guarantee) that you MAY receive a savings on your visit at this point. If you wait more than 48 hours, it will not be considered an emergency.   Drs. Georges [facility]:  [address], [place], [place], [address], [place]  Short-notice availability  Tooth evaluation: $100  Emergency Treatment: $200 (including exam, xrays, tooth extraction, and post-op visit)  Patients with Medicaid: [place] [id number], [place] [address], [phone]  If unable to pay, or uninsured, contact [facility] ([address]) [phone] in [place], [place] in [facility]) to become qualified for the adult dental clinic  Other [place]: [place], [place], [place], [address], [place], [place], 2nd and 4th Thursday of the month at 6:30am.  10 clients each day by appointment, can sometimes see walk-in patients if someone does not show for an appointment. - [facility]:  [address] Dank[address] [place], [place], [address], [facility]:  [address] [place], [place], [address], [place] Department:  [place] Department:  [place] Department:   [phone]

## 2013-06-09 NOTE — ED Provider Notes (Signed)
CSN: [id number]     Arrival date & time 06/09/13  2255 History   First MD Initiated Contact with Patient 06/09/13 2306     This chart was scribed for non-physician practitioner, Baron Sane PA-C working with Blanchie Dessert, MD by Forrestine Him, ED Scribe. This patient was seen in room TR05C/TR05C and the patient's care was started at 11:41 PM.   Chief Complaint  Patient presents with  . Dental Problem   The history is provided by the patient. No language interpreter was used.    HPI Comments: [name] is a 63 y.o. male who presents to the Emergency Department complaining of gradually worsening, intermittent, moderate right sided superior molar pain that has recently worsened this morning. He states his current pain radiates into his right ear and to the right side of his head. Pt denies any recent cracked teeth, but reports a history of dental issues. At this time he denies any alleviating or aggravating factors. He has not tried anything OTC or any at home remedies for his current discomfort. Denies any dyspnea, congestion, otalgia, fever, or chills. Pt states he is not followed by a dentist or an oral surgeon, but has expressed interest in recommendations to get his teeth pulled. His PMHx includes CAD, GERD, hyperlipidemia, HTN, diverticulitis, AAA, renal artery stenosis, and MI.  Past Medical History  Diagnosis Date  . CAD (coronary artery disease)     a. 1995 s/p CABG x 4: LIMA->LAD, VG->RI (known to be occluded), VG->AM->PDA;  b. 05/2002 Inf STEMI: VG->AM->PDA 100% treated w/ 2 BMS complicated by acute thrombosis req 3 BMS;  c. 07/2003 DES to  LAD & LCX (VG's to PDA & RI 100%);  d. 12/2010 Acute [place] ([place]): DES to LCX & LM , LIMA ok,;  e.12/2011 Cath: LM/LCX stents ok , LIMA patent.  Marland Kitchen GERD (gastroesophageal reflux disease)   . Hyperlipidemia   . Hypertension   . Diverticulitis     1/06 Diverticulitis--CT of pelvis--diffuse sigmoid divertic  . AAA (abdominal aortic aneurysm)      a. Duplex 01/2012: stable infrarenal saccular AAA at 3.3cm x 3.2xm (f/u recommended 01/2013 per Dr. Rockey Situ)  . Renal artery stenosis     a. 03/2011 PTA and stenting of L RA. b. 01/2012 renal duplex - stable 1-59% R renal artery stenosis, normal L renal artery s/p stent.  . Back pain   . Myocardial infarction   . Anginal pain   . Shortness of breath    Past Surgical History  Procedure Laterality Date  . Coronary artery bypass graft    . Closed reduction shoulder dislocation    . Angioplasty  1997  . Coronary stent placement  7/12    2 stents--Promus element plus (everolimus eluting)--[facility] in [place]  . Cardiac catheterization      8/09  Cath--vein graft occlusions which are old--no acute changes  . Cardiac catheterization  11/13/2010    stent x 2 @ [place]  . Renal artery stent  03/2011 ?   Family History  Problem Relation Age of Onset  . Coronary artery disease Mother     Died MI age 71  . Hypertension Mother   . Coronary artery disease Sister     Living  . Coronary artery disease Brother     Living  . Diabetes Neg Hx   . Cancer Neg Hx     prostate or colon  . Coronary artery disease Father     Died MI age 50  History  Substance Use Topics  . Smoking status: Former Smoker -- 3.00 packs/day for 25 years    Types: Cigarettes    Quit date: 05/28/1993  . Smokeless tobacco: Never Used  . Alcohol Use: 10.8 oz/week    18 Cans of beer per week     Comment: every day - 3-6 beers    Review of Systems  Constitutional: Negative for fever and chills.  HENT: Positive for dental problem and ear pain. Negative for congestion and rhinorrhea.   All other systems reviewed and are negative.      Allergies  Metoprolol tartrate  Home Medications   Current Outpatient Rx  Name  Route  Sig  Dispense  Refill  . aspirin EC 81 MG tablet   Oral   Take 81 mg by mouth daily.         . Benzocaine (ANBESOL MAXIMUM STRENGTH EX)   Apply externally   Apply 1  application topically once. For tooth pain         . carvedilol (COREG) 3.125 MG tablet   Oral   Take 3.125 mg by mouth 2 (two) times daily with a meal.         . isosorbide mononitrate (IMDUR) 30 MG 24 hr tablet   Oral   Take 30 mg by mouth at bedtime.         Marland Kitchen lisinopril (PRINIVIL,ZESTRIL) 5 MG tablet   Oral   Take 1 tablet (5 mg total) by mouth daily.   30 tablet   6   . mirtazapine (REMERON) 15 MG tablet   Oral   Take 15 mg by mouth at bedtime as needed (sleep).         . naproxen sodium (ALEVE) 220 MG tablet   Oral   Take 220 mg by mouth daily as needed (pain).         . nitroGLYCERIN (NITROSTAT) 0.4 MG SL tablet   Sublingual   Place 0.4 mg under the tongue every 5 (five) minutes as needed for chest pain.         Marland Kitchen omeprazole (PRILOSEC OTC) 20 MG tablet   Oral   Take 20 mg by mouth daily.         . prasugrel (EFFIENT) 10 MG TABS tablet   Oral   Take 1 tablet (10 mg total) by mouth daily.   30 tablet   6   . rosuvastatin (CRESTOR) 40 MG tablet   Oral   Take 40 mg by mouth at bedtime.         . traMADol (ULTRAM) 50 MG tablet   Oral   Take 50 mg by mouth at bedtime as needed (pain).          Marland Kitchen HYDROcodone-acetaminophen (NORCO/VICODIN) 5-325 MG per tablet   Oral   Take 1 tablet by mouth every 6 (six) hours as needed for severe pain.   15 tablet   0   . HYDROcodone-homatropine (HYCODAN) 5-1.5 MG/5ML syrup   Oral   Take 5 mLs by mouth at bedtime as needed for cough.   180 mL   0   . penicillin v potassium (VEETID) 500 MG tablet   Oral   Take 1 tablet (500 mg total) by mouth 4 (four) times daily.   40 tablet   0    Triage Vitals: BP 173/103  Pulse 78  Temp(Src) 97.4 F (36.3 C) (Oral)  Resp 16  Wt 149 lb 8 oz (67.813 kg)  SpO2 98%  Physical Exam  Constitutional: He is oriented to person, place, and time. He appears well-developed and well-nourished. No distress.  HENT:  Head: Normocephalic and atraumatic.  Right Ear:  Hearing, tympanic membrane, external ear and ear canal normal.  Left Ear: Hearing, tympanic membrane, external ear and ear canal normal.  Nose: Nose normal.  Mouth/Throat: Uvula is midline, oropharynx is clear and moist and mucous membranes are normal. No oral lesions. No trismus in the jaw. Abnormal dentition. Dental caries present. No dental abscesses, uvula swelling or lacerations. No oropharyngeal exudate.    Eyes: Conjunctivae are normal.  Neck: Normal range of motion. Neck supple.  Pulmonary/Chest: Effort normal.  Neurological: He is alert and oriented to person, place, and time.  Skin: Skin is warm and dry. He is not diaphoretic.  Psychiatric: He has a normal mood and affect.    ED Course  Procedures (including critical care time)  DIAGNOSTIC STUDIES: Oxygen Saturation is 98% on RA, Normal by my interpretation.    COORDINATION OF CARE: 11:48 PM- Will give Norco/Vicodin. Discussed treatment plan with pt at bedside and pt agreed to plan.     Labs Review Labs Reviewed - No data to display Imaging Review No results found.  EKG Interpretation   None       MDM   Final diagnoses:  Pain, dental    Filed Vitals:   06/09/13 2259  BP: 173/103  Pulse: 78  Temp: 97.4 F (36.3 C)  Resp: 16   Afebrile, NAD, non-toxic appearing, AAOx4. Patient with toothache.  No gross abscess.  Exam unconcerning for Ludwig's angina or spread of infection.  Will treat with penicillin and pain medicine.  Urged patient to follow-up with dentist. Return precautions discussed. Patient is agreeable to plan. Patient is stable at time of discharge  I personally performed the services described in this documentation, which was scribed in my presence. The recorded information has been reviewed and is accurate.    Harlow Mares, PA-C 06/10/13 0017

## 2013-06-09 NOTE — ED Notes (Signed)
The pt has had a toothache all day

## 2013-06-10 NOTE — ED Provider Notes (Signed)
Medical screening examination/treatment/procedure(s) were performed by non-physician practitioner and as supervising physician I was immediately available for consultation/collaboration.  EKG Interpretation   None         Kyree Adriano, MD 06/10/13 1232 

## 2013-09-03 ENCOUNTER — Other Ambulatory Visit: Payer: Self-pay

## 2013-09-03 MED ORDER — ROSUVASTATIN CALCIUM 40 MG PO TABS: 40.0000 mg | ORAL_TABLET | Freq: Every day | ORAL | Status: DC

## 2013-09-03 NOTE — Telephone Encounter (Signed)
Refill sent for crestor 40 mg 

## 2013-09-13 ENCOUNTER — Telehealth: Payer: Self-pay | Admitting: *Deleted

## 2013-09-13 NOTE — Telephone Encounter (Signed)
Wife called and would like to know what kind of cold medicine can he take for runny nose and coughing as he has hbp. Thanks

## 2013-09-14 NOTE — Telephone Encounter (Signed)
Left detailed message on wife's voicemail

## 2013-09-20 ENCOUNTER — Encounter: Payer: Self-pay | Admitting: Family Medicine

## 2013-09-20 ENCOUNTER — Ambulatory Visit (INDEPENDENT_AMBULATORY_CARE_PROVIDER_SITE_OTHER): Payer: 59 | Admitting: Family Medicine

## 2013-09-20 ENCOUNTER — Ambulatory Visit: Payer: 59 | Admitting: Internal Medicine

## 2013-09-20 VITALS — BP 140/90 | HR 70 | Temp 98.0°F | Ht 65.0 in | Wt 153.5 lb

## 2013-09-20 DIAGNOSIS — I714 Abdominal aortic aneurysm, without rupture, unspecified: Secondary | ICD-10-CM

## 2013-09-20 DIAGNOSIS — J189 Pneumonia, unspecified organism: Secondary | ICD-10-CM

## 2013-09-20 DIAGNOSIS — Z720 Tobacco use: Secondary | ICD-10-CM

## 2013-09-20 DIAGNOSIS — F17200 Nicotine dependence, unspecified, uncomplicated: Secondary | ICD-10-CM

## 2013-09-20 DIAGNOSIS — I2510 Atherosclerotic heart disease of native coronary artery without angina pectoris: Secondary | ICD-10-CM

## 2013-09-20 DIAGNOSIS — I252 Old myocardial infarction: Secondary | ICD-10-CM

## 2013-09-20 MED ORDER — PREDNISONE 20 MG PO TABS: 40.0000 mg | ORAL_TABLET | Freq: Every day | ORAL | Status: DC

## 2013-09-20 MED ORDER — BENZONATATE 200 MG PO CAPS: 200.0000 mg | ORAL_CAPSULE | Freq: Two times a day (BID) | ORAL | Status: DC | PRN

## 2013-09-20 MED ORDER — DOXYCYCLINE HYCLATE 100 MG PO TABS: 100.0000 mg | ORAL_TABLET | Freq: Two times a day (BID) | ORAL | Status: DC

## 2013-09-20 MED ORDER — HYDROCODONE-HOMATROPINE 5-1.5 MG/5ML PO SYRP: ORAL_SOLUTION | ORAL | Status: DC

## 2013-09-20 NOTE — Progress Notes (Signed)
[place] [address] Phone: [phone] Fax: [phone]  Patient ID: [name] MRN: [id number], DOB: Nov 12, 1950, 64 y.o. Date of Encounter: 09/20/2013  Primary Physician:  Viviana Simpler, MD   Chief Complaint: Cough  Subjective:   History of Present Illness:  [name] is a 63 y.o. pleasant patient who presents with the following:  Complex medical patient with multiple history of myocardial infarction in the past, status post CABG, status post stents, with also renal artery stenosis, abdominal aortic aneurysm, and he also has a 90-pack-year smoking history, though he quit approximately 20 years ago.  Started out with some runny nose and cough about 6-7 days ago, then took some coricidin. Has been getting worse and been taking coricidin, coughing to the point of choking. He has a notable abdominal aortic aneurysm. He started to become scared last night when his ventral hernia started to hurt, and is here for evaluation of multiple of these issues.  No fever.  Some sore throat. "Feels like a lump in his throat" Decreased appetite.   30 years, 3 packs a day - 90 pack years.  Cannot afford Effient. Change back to plavix - ask Dr. Rockey Situ   Patient Active Problem List   Diagnosis Date Noted  . AAA (abdominal aortic aneurysm) without rupture 09/20/2013  . History of MI (myocardial infarction) 09/20/2013  . Healthcare maintenance 12/13/2012  . Precordial pain 10/26/2012  . Intermediate coronary syndrome 10/25/2012  . Unstable angina 01/03/2012  . Renal artery stenosis 04/01/2011  . Sleep disturbance, unspecified 12/30/2010  . DIVERTICULITIS OF COLON 11/05/2008  . HYPERLIPIDEMIA 12/21/2007  . HYPERTENSION 12/21/2007  . CORONARY ARTERY DISEASE 12/21/2007  . GERD 12/21/2007  . DIVERTICULAR DISEASE 05/18/2004   Past Medical History  Diagnosis Date  . CAD (coronary artery disease)     a. 1995 s/p CABG x 4: LIMA->LAD, VG->RI (known to be occluded),  VG->AM->PDA;  b. 05/2002 Inf STEMI: VG->AM->PDA 100% treated w/ 2 BMS complicated by acute thrombosis req 3 BMS;  c. 07/2003 DES to  LAD & LCX (VG's to PDA & RI 100%);  d. 12/2010 Acute [place] ([place]): DES to LCX & LM , LIMA ok,;  e.12/2011 Cath: LM/LCX stents ok , LIMA patent.  Marland Kitchen GERD (gastroesophageal reflux disease)   . Hyperlipidemia   . Hypertension   . Diverticulitis     1/06 Diverticulitis--CT of pelvis--diffuse sigmoid divertic  . AAA (abdominal aortic aneurysm)     a. Duplex 01/2012: stable infrarenal saccular AAA at 3.3cm x 3.2xm (f/u recommended 01/2013 per Dr. Rockey Situ)  . Renal artery stenosis     a. 03/2011 PTA and stenting of L RA. b. 01/2012 renal duplex - stable 1-59% R renal artery stenosis, normal L renal artery s/p stent.  . Back pain   . Myocardial infarction   . Anginal pain   . Shortness of breath   . AAA (abdominal aortic aneurysm) without rupture 09/20/2013   Past Surgical History  Procedure Laterality Date  . Coronary artery bypass graft    . Closed reduction shoulder dislocation    . Angioplasty  1997  . Coronary stent placement  7/12    2 stents--Promus element plus (everolimus eluting)--[facility] in [place]  . Cardiac catheterization      8/09  Cath--vein graft occlusions which are old--no acute changes  . Cardiac catheterization  11/13/2010    stent x 2 @ [place]  . Renal artery stent  03/2011 ?   History   Social  History  . Marital Status: Married    Spouse Name: N/A    Number of Children: 3  . Years of Education: N/A   Occupational History  . Geneticist, molecular (Animal nutritionist)     own business now   Social History Main Topics  . Smoking status: Former Smoker -- 3.00 packs/day for 25 years    Types: Cigarettes    Quit date: 05/28/1993  . Smokeless tobacco: Never Used  . Alcohol Use: 10.8 oz/week    18 Cans of beer per week     Comment: every day - 3-6 beers  . Drug Use: No  . Sexual Activity: Not on file   Other Topics Concern  .  Not on file   Social History Narrative  . No narrative on file   Family History  Problem Relation Age of Onset  . Coronary artery disease Mother     Died MI age 12  . Hypertension Mother   . Coronary artery disease Sister     Living  . Coronary artery disease Brother     Living  . Diabetes Neg Hx   . Cancer Neg Hx     prostate or colon  . Coronary artery disease Father     Died MI age 25   Allergies  Allergen Reactions  . Metoprolol Tartrate Hives and Itching   Medication list has been reviewed and updated.  Review of Systems: ROS: GEN: Acute illness details above GI: Tolerating PO intake GU: maintaining adequate hydration and urination Pulm: + SOB with exertion Interactive and getting along well at home.  Otherwise, ROS is as per the HPI.   Objective:   Physical Examination: BP 140/90  Pulse 70  Temp(Src) 98 F (36.7 C) (Oral)  Ht 5\' 5"  (1.651 m)  Wt 153 lb 8 oz (69.627 kg)  BMI 25.54 kg/m2  SpO2 97%   GEN: WDWN, NAD, Non-toxic, A & O x 3 HEENT: Atraumatic, Normocephalic. Neck supple. No masses, No LAD. Ears and Nose: No external deformity. CV: RRR, No M/G/R. No JVD. No thrill. No extra heart sounds. PULM: Occ rhonchi. No retractions. No resp. distress. No accessory muscle use. EXTR: No c/c/e NEURO Normal gait.  PSYCH: Normally interactive. Conversant. Not depressed or anxious appearing.  Calm demeanor.   Laboratory and Imaging Data:  Assessment & Plan:   Walking pneumonia  Tobacco abuse  AAA (abdominal aortic aneurysm) without rupture  CORONARY ARTERY DISEASE  History of MI (myocardial infarction)  Greater than 7 day history of severe cough, worsening shortness of breath in a patient with a 90-pack-year smoking history and an abdominal aortic aneurysm. Liberal use of Hycodan as much as tolerated. Tessalon as needed in addition. Doxycycline will be used to cover for atypical organisms.  Given the patient has a 90-pack-year smoking history,  and shortness of breath today, I would consider pulmonary function testing when he is healthy at his next routine office visit for evaluation for COPD. I do not see any obvious COPD or emphysema on recent chest x-rays.  The patient is unable to afford his Effient. I emphasized its importance, and will for potential alternatives.  Follow-up: No Follow-up on file. Unless noted above, the patient is to follow-up if symptoms worsen. Red flags were reviewed with the patient.  New Prescriptions   BENZONATATE (TESSALON) 200 MG CAPSULE    Take 1 capsule (200 mg total) by mouth 2 (two) times daily as needed for cough.   DOXYCYCLINE (VIBRA-TABS) 100 MG TABLET  Take 1 tablet (100 mg total) by mouth 2 (two) times daily.   HYDROCODONE-HOMATROPINE (HYCODAN) 5-1.5 MG/5ML SYRUP    1 tsp po at night before bed prn cough   PREDNISONE (DELTASONE) 20 MG TABLET    Take 2 tablets (40 mg total) by mouth daily with breakfast.   No orders of the defined types were placed in this encounter.    Signed,  Maud Deed. Mena Simonis, MD, [place]   Patient's Medications  New Prescriptions   BENZONATATE (TESSALON) 200 MG CAPSULE    Take 1 capsule (200 mg total) by mouth 2 (two) times daily as needed for cough.   DOXYCYCLINE (VIBRA-TABS) 100 MG TABLET    Take 1 tablet (100 mg total) by mouth 2 (two) times daily.   HYDROCODONE-HOMATROPINE (HYCODAN) 5-1.5 MG/5ML SYRUP    1 tsp po at night before bed prn cough   PREDNISONE (DELTASONE) 20 MG TABLET    Take 2 tablets (40 mg total) by mouth daily with breakfast.  Previous Medications   ASPIRIN EC 81 MG TABLET    Take 81 mg by mouth daily.   CARVEDILOL (COREG) 3.125 MG TABLET    Take 3.125 mg by mouth 2 (two) times daily with a meal.   ISOSORBIDE MONONITRATE (IMDUR) 30 MG 24 HR TABLET    Take 30 mg by mouth at bedtime.   LISINOPRIL (PRINIVIL,ZESTRIL) 5 MG TABLET    Take 1 tablet (5 mg total) by mouth daily.   MIRTAZAPINE (REMERON) 15 MG TABLET    Take 15 mg by mouth  at bedtime as needed (sleep).   NAPROXEN SODIUM (ALEVE) 220 MG TABLET    Take 220 mg by mouth daily as needed (pain).   NITROGLYCERIN (NITROSTAT) 0.4 MG SL TABLET    Place 0.4 mg under the tongue every 5 (five) minutes as needed for chest pain.   OMEPRAZOLE (PRILOSEC OTC) 20 MG TABLET    Take 20 mg by mouth daily.   PRASUGREL (EFFIENT) 10 MG TABS TABLET    Take 1 tablet (10 mg total) by mouth daily.   ROSUVASTATIN (CRESTOR) 40 MG TABLET    Take 1 tablet (40 mg total) by mouth at bedtime.   TRAMADOL (ULTRAM) 50 MG TABLET    Take 50 mg by mouth at bedtime as needed (pain).   Modified Medications   No medications on file  Discontinued Medications   BENZOCAINE (ANBESOL MAXIMUM STRENGTH EX)    Apply 1 application topically once. For tooth pain   HYDROCODONE-ACETAMINOPHEN (NORCO/VICODIN) 5-325 MG PER TABLET    Take 1 tablet by mouth every 6 (six) hours as needed for severe pain.   HYDROCODONE-HOMATROPINE (HYCODAN) 5-1.5 MG/5ML SYRUP    Take 5 mLs by mouth at bedtime as needed for cough.   PENICILLIN V POTASSIUM (VEETID) 500 MG TABLET    Take 1 tablet (500 mg total) by mouth 4 (four) times daily.

## 2013-09-20 NOTE — Progress Notes (Signed)
Pre visit review using our clinic review tool, if applicable. No additional management support is needed unless otherwise documented below in the visit note. 

## 2013-09-21 ENCOUNTER — Telehealth: Payer: Self-pay | Admitting: Cardiovascular Disease

## 2013-09-21 NOTE — Telephone Encounter (Signed)
Can we provide him with any effient samples. Also does he want to change to aspirin 81 mg x2 with plavix 75 mg daily If we made this change, we will hold his effient

## 2013-09-21 NOTE — Telephone Encounter (Signed)
Spoke w/ pt.  He states that he is was on plavix and prilosec previously and they did not work together.   He would like samples of effient to last until his appt w/ Dr. Rockey Situ on 10/15/13. Advised him that I would leave them at the front desk for him to p/u.

## 2013-09-21 NOTE — Telephone Encounter (Signed)
Spoke w/ pt's wife.  She will have pt call back when gets off the other line.

## 2013-09-29 ENCOUNTER — Encounter (HOSPITAL_COMMUNITY): Payer: Self-pay | Admitting: Emergency Medicine

## 2013-09-29 ENCOUNTER — Emergency Department (HOSPITAL_COMMUNITY)
Admission: EM | Admit: 2013-09-29 | Discharge: 2013-09-30 | Disposition: A | Discharge status: Home / Self Care | Payer: 59 | Attending: Emergency Medicine | Admitting: Emergency Medicine

## 2013-09-29 ENCOUNTER — Emergency Department (HOSPITAL_COMMUNITY): Payer: 59

## 2013-09-29 DIAGNOSIS — Z791 Long term (current) use of non-steroidal anti-inflammatories (NSAID): Secondary | ICD-10-CM | POA: Insufficient documentation

## 2013-09-29 DIAGNOSIS — K5732 Diverticulitis of large intestine without perforation or abscess without bleeding: Secondary | ICD-10-CM | POA: Insufficient documentation

## 2013-09-29 DIAGNOSIS — I252 Old myocardial infarction: Secondary | ICD-10-CM | POA: Insufficient documentation

## 2013-09-29 DIAGNOSIS — K5792 Diverticulitis of intestine, part unspecified, without perforation or abscess without bleeding: Secondary | ICD-10-CM

## 2013-09-29 DIAGNOSIS — Z87891 Personal history of nicotine dependence: Secondary | ICD-10-CM | POA: Insufficient documentation

## 2013-09-29 DIAGNOSIS — K219 Gastro-esophageal reflux disease without esophagitis: Secondary | ICD-10-CM | POA: Insufficient documentation

## 2013-09-29 DIAGNOSIS — Z951 Presence of aortocoronary bypass graft: Secondary | ICD-10-CM | POA: Insufficient documentation

## 2013-09-29 DIAGNOSIS — I10 Essential (primary) hypertension: Secondary | ICD-10-CM | POA: Insufficient documentation

## 2013-09-29 DIAGNOSIS — Z7982 Long term (current) use of aspirin: Secondary | ICD-10-CM | POA: Insufficient documentation

## 2013-09-29 DIAGNOSIS — Z792 Long term (current) use of antibiotics: Secondary | ICD-10-CM | POA: Insufficient documentation

## 2013-09-29 DIAGNOSIS — I209 Angina pectoris, unspecified: Secondary | ICD-10-CM | POA: Insufficient documentation

## 2013-09-29 DIAGNOSIS — Z79899 Other long term (current) drug therapy: Secondary | ICD-10-CM | POA: Insufficient documentation

## 2013-09-29 DIAGNOSIS — I2510 Atherosclerotic heart disease of native coronary artery without angina pectoris: Secondary | ICD-10-CM | POA: Insufficient documentation

## 2013-09-29 DIAGNOSIS — E785 Hyperlipidemia, unspecified: Secondary | ICD-10-CM | POA: Insufficient documentation

## 2013-09-29 LAB — CBC WITH DIFFERENTIAL/PLATELET
BASOS ABS: 0.1 10*3/uL (ref 0.0–0.1)
BASOS PCT: 1 % (ref 0–1)
Eosinophils Absolute: 0.3 10*3/uL (ref 0.0–0.7)
Eosinophils Relative: 3 % (ref 0–5)
HCT: 41.6 % (ref 39.0–52.0)
Hemoglobin: 14.9 g/dL (ref 13.0–17.0)
LYMPHS PCT: 20 % (ref 12–46)
Lymphs Abs: 2.5 10*3/uL (ref 0.7–4.0)
MCH: 33.4 pg (ref 26.0–34.0)
MCHC: 35.8 g/dL (ref 30.0–36.0)
MCV: 93.3 fL (ref 78.0–100.0)
MONO ABS: 1.5 10*3/uL — AB (ref 0.1–1.0)
Monocytes Relative: 12 % (ref 3–12)
NEUTROS PCT: 64 % (ref 43–77)
Neutro Abs: 8.1 10*3/uL — ABNORMAL HIGH (ref 1.7–7.7)
PLATELETS: 306 10*3/uL (ref 150–400)
RBC: 4.46 MIL/uL (ref 4.22–5.81)
RDW: 14.4 % (ref 11.5–15.5)
WBC: 12.4 10*3/uL — AB (ref 4.0–10.5)

## 2013-09-29 LAB — COMPREHENSIVE METABOLIC PANEL
ALK PHOS: 65 U/L (ref 39–117)
ALT: 16 U/L (ref 0–53)
AST: 18 U/L (ref 0–37)
Albumin: 3.8 g/dL (ref 3.5–5.2)
BILIRUBIN TOTAL: 0.6 mg/dL (ref 0.3–1.2)
BUN: 7 mg/dL (ref 6–23)
CO2: 24 meq/L (ref 19–32)
Calcium: 9.9 mg/dL (ref 8.4–10.5)
Chloride: 99 mEq/L (ref 96–112)
Creatinine, Ser: 0.75 mg/dL (ref 0.50–1.35)
GLUCOSE: 98 mg/dL (ref 70–99)
POTASSIUM: 4.2 meq/L (ref 3.7–5.3)
SODIUM: 137 meq/L (ref 137–147)
Total Protein: 7.6 g/dL (ref 6.0–8.3)

## 2013-09-29 LAB — URINALYSIS, ROUTINE W REFLEX MICROSCOPIC
BILIRUBIN URINE: NEGATIVE
GLUCOSE, UA: NEGATIVE mg/dL
Hgb urine dipstick: NEGATIVE
Ketones, ur: NEGATIVE mg/dL
Leukocytes, UA: NEGATIVE
Nitrite: NEGATIVE
Protein, ur: NEGATIVE mg/dL
SPECIFIC GRAVITY, URINE: 1.008 (ref 1.005–1.030)
UROBILINOGEN UA: 0.2 mg/dL (ref 0.0–1.0)
pH: 7 (ref 5.0–8.0)

## 2013-09-29 MED ORDER — SODIUM CHLORIDE 0.9 % IV BOLUS (SEPSIS)
500.0000 mL | Freq: Once | INTRAVENOUS | Status: AC
  Administered 2013-09-29: 500 mL via INTRAVENOUS

## 2013-09-29 MED ORDER — OXYCODONE-ACETAMINOPHEN 5-325 MG PO TABS
1.0000 | ORAL_TABLET | Freq: Once | ORAL | Status: AC
  Administered 2013-09-29: 1 via ORAL
  Filled 2013-09-29: qty 1

## 2013-09-29 MED ORDER — METRONIDAZOLE IN NACL 5-0.79 MG/ML-% IV SOLN
500.0000 mg | Freq: Once | INTRAVENOUS | Status: AC
  Administered 2013-09-29: 500 mg via INTRAVENOUS
  Filled 2013-09-29: qty 100

## 2013-09-29 MED ORDER — CIPROFLOXACIN IN D5W 400 MG/200ML IV SOLN
400.0000 mg | Freq: Once | INTRAVENOUS | Status: AC
  Administered 2013-09-30: 400 mg via INTRAVENOUS
  Filled 2013-09-29: qty 200

## 2013-09-29 MED ORDER — IOHEXOL 300 MG/ML  SOLN
100.0000 mL | Freq: Once | INTRAMUSCULAR | Status: AC | PRN
  Administered 2013-09-29: 100 mL via INTRAVENOUS

## 2013-09-29 MED ORDER — IOHEXOL 300 MG/ML  SOLN: 25.0000 mL | Freq: Once | INTRAMUSCULAR | Status: AC | PRN

## 2013-09-29 MED ORDER — HYDROMORPHONE HCL PF 1 MG/ML IJ SOLN
1.0000 mg | Freq: Once | INTRAMUSCULAR | Status: AC
  Administered 2013-09-29: 1 mg via INTRAVENOUS
  Filled 2013-09-29: qty 1

## 2013-09-29 NOTE — ED Notes (Signed)
Pt c/o  Mid-abd pain since last pm  No nv or diarrhea.  History of diverticulitis

## 2013-09-29 NOTE — ED Notes (Signed)
MD resident at bedside

## 2013-09-29 NOTE — ED Notes (Signed)
Iv placed   Med given.  He has finished his oral contrast

## 2013-09-29 NOTE — ED Notes (Signed)
Patient transported to CT 

## 2013-09-29 NOTE — ED Notes (Signed)
To ct

## 2013-09-29 NOTE — ED Provider Notes (Signed)
CSN: [id number]     Arrival date & time 09/29/13  1608 History   First MD Initiated Contact with Patient 09/29/13 2115     Chief Complaint  Patient presents with  . Abdominal Pain     (Consider location/radiation/quality/duration/timing/severity/associated sxs/prior Treatment) Patient is a 63 y.o. male presenting with abdominal pain.  Abdominal Pain Pain location: diffuse lower. primarily LLQ. Pain quality: aching and squeezing   Pain radiates to:  Does not radiate Pain severity:  Severe Onset quality:  Gradual Timing:  Constant Progression:  Worsening Chronicity:  New Relieved by: percocet in triage gave some relief. Worsened by:  Movement and palpation Associated symptoms: no chest pain, no chills, no constipation, no cough, no diarrhea, no dysuria, no fever, no hematemesis, no hematuria, no nausea, no shortness of breath and no vomiting     63 yo male pw abd pain. LLQ. Onset last night. Worsening. Decreased po. No n/v. Mild bilateral low back discomfort yesterday after mowing grass. Resolved. No back and no radiation of abd pain to back. Denies dysuria, but states it hurts his abdomen a little more when he urinates. No hematuria. No constipation or diarrhea. No CP or SOB. No trauma.  H/o AAA. Monitored for years. No change. No prior procedures on AAA. Does have h/o renal art stenosis sp stent.   Past Medical History  Diagnosis Date  . CAD (coronary artery disease)     a. 1995 s/p CABG x 4: LIMA->LAD, VG->RI (known to be occluded), VG->AM->PDA;  b. 05/2002 Inf STEMI: VG->AM->PDA 100% treated w/ 2 BMS complicated by acute thrombosis req 3 BMS;  c. 07/2003 DES to  LAD & LCX (VG's to PDA & RI 100%);  d. 12/2010 Acute MI ([place]): DES to LCX & LM , LIMA ok,;  e.12/2011 Cath: LM/LCX stents ok , LIMA patent.  Marland Kitchen GERD (gastroesophageal reflux disease)   . Hyperlipidemia   . Hypertension   . Diverticulitis     1/06 Diverticulitis--CT of pelvis--diffuse sigmoid divertic  . AAA (abdominal aortic  aneurysm)     a. Duplex 01/2012: stable infrarenal saccular AAA at 3.3cm x 3.2xm (f/u recommended 01/2013 per Dr. Rockey Situ)  . Renal artery stenosis     a. 03/2011 PTA and stenting of L RA. b. 01/2012 renal duplex - stable 1-59% R renal artery stenosis, normal L renal artery s/p stent.  . Back pain   . Myocardial infarction   . Anginal pain   . Shortness of breath   . AAA (abdominal aortic aneurysm) without rupture 09/20/2013   Past Surgical History  Procedure Laterality Date  . Coronary artery bypass graft    . Closed reduction shoulder dislocation    . Angioplasty  1997  . Coronary stent placement  7/12    2 stents--Promus element plus (everolimus eluting)--[facility] in [place]  . Cardiac catheterization      8/09  Cath--vein graft occlusions which are old--no acute changes  . Cardiac catheterization  11/13/2010    stent x 2 @ [place]  . Renal artery stent  03/2011 ?   Family History  Problem Relation Age of Onset  . Coronary artery disease Mother     Died MI age 59  . Hypertension Mother   . Coronary artery disease Sister     Living  . Coronary artery disease Brother     Living  . Diabetes Neg Hx   . Cancer Neg Hx     prostate or colon  . Coronary artery disease Father  Died MI age 65   History  Substance Use Topics  . Smoking status: Former Smoker -- 3.00 packs/day for 25 years    Types: Cigarettes    Quit date: 05/28/1993  . Smokeless tobacco: Never Used  . Alcohol Use: 10.8 oz/week    18 Cans of beer per week     Comment: every day - 3-6 beers    Review of Systems  Constitutional: Negative for fever and chills.  HENT: Negative for congestion and rhinorrhea.   Respiratory: Negative for cough and shortness of breath.   Cardiovascular: Negative for chest pain and leg swelling.  Gastrointestinal: Positive for abdominal pain. Negative for nausea, vomiting, diarrhea, constipation, blood in stool and hematemesis.  Genitourinary: Negative for dysuria,  hematuria, flank pain, difficulty urinating, penile pain and testicular pain.  Neurological: Negative for dizziness and headaches.  All other systems reviewed and are negative.     Allergies  Metoprolol tartrate  Home Medications   Prior to Admission medications   Medication Sig Start Date End Date Taking? Authorizing Provider  aspirin EC 325 MG tablet Take 325 mg by mouth daily.   Yes Historical Provider, MD  benzonatate (TESSALON) 200 MG capsule Take 1 capsule (200 mg total) by mouth 2 (two) times daily as needed for cough. 09/20/13  Yes Spencer Copland, MD  carvedilol (COREG) 3.125 MG tablet Take 3.125 mg by mouth 2 (two) times daily with a meal.   Yes Historical Provider, MD  doxycycline (VIBRA-TABS) 100 MG tablet Take 1 tablet (100 mg total) by mouth 2 (two) times daily. 09/20/13  Yes Spencer Copland, MD  HYDROcodone-homatropine (HYCODAN) 5-1.5 MG/5ML syrup Take 5 mLs by mouth at bedtime as needed for cough.   Yes Historical Provider, MD  isosorbide mononitrate (IMDUR) 30 MG 24 hr tablet Take 30 mg by mouth at bedtime.   Yes Historical Provider, MD  lisinopril (PRINIVIL,ZESTRIL) 5 MG tablet Take 1 tablet (5 mg total) by mouth daily. 03/06/13  Yes Minna Merritts, MD  mirtazapine (REMERON) 15 MG tablet Take 15 mg by mouth at bedtime as needed (sleep).   Yes Historical Provider, MD  naproxen sodium (ALEVE) 220 MG tablet Take 220 mg by mouth daily as needed (pain).   Yes Historical Provider, MD  omeprazole (PRILOSEC OTC) 20 MG tablet Take 20 mg by mouth daily.   Yes Historical Provider, MD  prasugrel (EFFIENT) 10 MG TABS tablet Take 1 tablet (10 mg total) by mouth daily. 03/07/13  Yes Burnell Blanks, MD  rosuvastatin (CRESTOR) 40 MG tablet Take 1 tablet (40 mg total) by mouth at bedtime. 09/03/13  Yes Minna Merritts, MD  ciprofloxacin (CIPRO) 500 MG tablet Take 1 tablet (500 mg total) by mouth 2 (two) times daily. For 10 days 09/30/13 10/09/13  Bonnita Hollow, MD  metroNIDAZOLE  (FLAGYL) 500 MG tablet Take 1 tablet (500 mg total) by mouth 3 (three) times daily. 09/30/13 10/09/13  Bonnita Hollow, MD  nitroGLYCERIN (NITROSTAT) 0.4 MG SL tablet Place 0.4 mg under the tongue every 5 (five) minutes as needed for chest pain.    Historical Provider, MD  ondansetron (ZOFRAN) 4 MG tablet Take 1 tablet (4 mg total) by mouth every 8 (eight) hours as needed for nausea or vomiting. 09/30/13   Bonnita Hollow, MD  oxyCODONE-acetaminophen (PERCOCET/ROXICET) 5-325 MG per tablet Take 1 tablet by mouth every 6 (six) hours as needed for severe pain. 09/30/13 10/09/13  Bonnita Hollow, MD   BP 119/85  Pulse 77  Temp(Src) 97.7 F (36.5  C) (Oral)  Resp 18  Wt 151 lb 1 oz (68.522 kg)  SpO2 97% Physical Exam  Nursing note and vitals reviewed. Constitutional: He is oriented to person, place, and time. He appears well-developed and well-nourished. No distress.  HENT:  Head: Normocephalic and atraumatic.  Eyes: Conjunctivae are normal. Right eye exhibits no discharge. Left eye exhibits no discharge.  Neck: No tracheal deviation present.  Cardiovascular: Normal rate, regular rhythm, normal heart sounds and intact distal pulses.   Pulmonary/Chest: Effort normal and breath sounds normal. No stridor. No respiratory distress. He has no wheezes. He has no rales.  Abdominal: Soft. He exhibits no distension. There is tenderness (primarily LLQ, but with suprapubic, RLQ, and LUQ discomfort as well. not peritonitic.). There is no guarding and no CVA tenderness.  Musculoskeletal: He exhibits no edema and no tenderness.       Thoracic back: He exhibits no tenderness and no bony tenderness.       Lumbar back: He exhibits no tenderness and no bony tenderness.  Neurological: He is alert and oriented to person, place, and time.  Skin: Skin is warm and dry.  Psychiatric: He has a normal mood and affect. His behavior is normal.    ED Course  Procedures (including critical care time) Labs Review Labs Reviewed  CBC  WITH DIFFERENTIAL - Abnormal; Notable for the following:    WBC 12.4 (*)    Neutro Abs 8.1 (*)    Monocytes Absolute 1.5 (*)    All other components within normal limits  URINALYSIS, ROUTINE W REFLEX MICROSCOPIC  COMPREHENSIVE METABOLIC PANEL    Imaging Review Ct Abdomen Pelvis W Contrast  09/29/2013   CLINICAL DATA:  Lower abdominal pain and; history of diverticulitis, abdominal aortic aneurysm, and placement of a renal artery stent  EXAM: CT ABDOMEN AND PELVIS WITH CONTRAST  TECHNIQUE: Multidetector CT imaging of the abdomen and pelvis was performed using the standard protocol following bolus administration of intravenous contrast.  CONTRAST:  173mL OMNIPAQUE IOHEXOL 300 MG/ML SOLN intravenously ; the patient also received oral contrast material.  COMPARISON:  CT scan of the abdomen and pelvis of May 21, 2004  FINDINGS: There are inflammatory changes of the distal descending colon and proximal sigmoid colon consistent with acute diverticulitis. There is a small amount of pericolonic fluid. There is no discrete abscess. No free extraluminal gas collections are demonstrated but contained perforation is not excluded. Proximal to this the colon and small bowel are normal.  The liver, pancreas, spleen, nondistended stomach, adrenal glands, and kidneys are normal. The urinary bladder, prostate gland, and seminal vesicles are normal. There is no inguinal nor umbilical hernia. There are tiny layering gallstones without pericholecystic inflammation. There is a 3.4 cm diameter infrarenal abdominal aortic aneurysm which does not involve the common iliac arteries. A left renal stent is in place above the aneurysm. There is no periaortic or pericaval hematoma or lymphadenopathy.  The lumbar vertebral bodies are preserved in height with exception of L1 with there is anterior wedge compression with loss of height of 20% anteriorly. This was faintly visible on a lateral chest X ray of January 03, 2012 but it has  mildly progressed. There is disc space narrowing at L5-S1. The bony pelvis is normal.  There is mild dependent atelectasis versus fibrosis at the lung bases.  IMPRESSION: 1. There is acute diverticulitis at the descending colonic -rectosigmoid junction. There is no abscess or definite perforation. Contained perforation cannot be excluded. 2. There are tiny gallstones without evidence  of acute cholecystitis. 3. There is a 3.4 cm diameter infrarenal abdominal aortic aneurysm this has increased from 2.3 cm in January of 2006. There is no evidence of acute leakage of blood.   Electronically Signed   By: David  Martinique   On: 09/29/2013 23:08     EKG Interpretation None      MDM   Final diagnoses:  Diverticulitis    Diverticulitis on CT scan. C/w patient's presentation. H/o same. Pain well controlled. Discussed inpt vs outpt mgmt. Patient and wife request trial of outpt mgmt.  Successful PO trial. First dose cipro/flagyl given IV.  Rest of work up reassuring.  Patient to be discharged home after IV abx. Tolerating po (eating sandwich). Return precautions given. To follow up with pcp. Patient and wife in agreement with plan.   Discussed case with Dr. Tomi Bamberger who is in agreement with assessment and plan.    Bonnita Hollow, MD 09/30/13 [phone]

## 2013-09-29 NOTE — ED Notes (Signed)
Reports having pain that started as lower back pain last night and now severe lower abd pain today. Denies n/v/d. Hx of diverticulitis, denies any blood in stools. Denies urinary symptoms.

## 2013-09-30 MED ORDER — CIPROFLOXACIN HCL 500 MG PO TABS: 500.0000 mg | ORAL_TABLET | Freq: Two times a day (BID) | ORAL | Status: DC

## 2013-09-30 MED ORDER — ONDANSETRON HCL 4 MG PO TABS: 4.0000 mg | ORAL_TABLET | Freq: Three times a day (TID) | ORAL | Status: DC | PRN

## 2013-09-30 MED ORDER — OXYCODONE-ACETAMINOPHEN 5-325 MG PO TABS: 1.0000 | ORAL_TABLET | Freq: Four times a day (QID) | ORAL | Status: AC | PRN

## 2013-09-30 MED ORDER — METRONIDAZOLE 500 MG PO TABS: 500.0000 mg | ORAL_TABLET | Freq: Three times a day (TID) | ORAL | Status: DC

## 2013-09-30 MED ORDER — HYDROCODONE-ACETAMINOPHEN 5-325 MG PO TABS: 2.0000 | ORAL_TABLET | Freq: Four times a day (QID) | ORAL | Status: DC | PRN

## 2013-09-30 MED ORDER — OXYCODONE-ACETAMINOPHEN 5-325 MG PO TABS
1.0000 | ORAL_TABLET | Freq: Once | ORAL | Status: AC
  Administered 2013-09-30: 1 via ORAL
  Filled 2013-09-30: qty 1

## 2013-09-30 NOTE — ED Notes (Signed)
Pt discharged home with all belongings, pt alert, oriented, and ambulatory upon discharge. 4 new RX prescribed, pt escorted to exit via wheel chair by [employer], pt then transported to the top of the parking lot via security

## 2013-09-30 NOTE — ED Notes (Signed)
MD Mclennan at bedside.

## 2013-09-30 NOTE — ED Provider Notes (Signed)
I saw and evaluated the patient, reviewed the resident's note and I agree with the findings and plan.  Pt's CT scan showed acute diverticulitis.  No definite abscess or fluid collection.  Pt was given IV antibiotics in the ED.  He was tolerating PO.  Pt wanted to try outpatient treatment.  Warning signs and precautions discussed.  Dorie Rank, MD 09/30/13 1538

## 2013-09-30 NOTE — Discharge Instructions (Signed)
Diverticulitis °A diverticulum is a small pouch or sac on the colon. Diverticulosis is the presence of these diverticula on the colon. Diverticulitis is the irritation (inflammation) or infection of diverticula. °CAUSES  °The colon and its diverticula contain bacteria. If food particles block the tiny opening to a diverticulum, the bacteria inside can grow and cause an increase in pressure. This leads to infection and inflammation and is called diverticulitis. °SYMPTOMS  °· Abdominal pain and tenderness. Usually, the pain is located on the left side of your abdomen. However, it could be located elsewhere. °· Fever. °· Bloating. °· Feeling sick to your stomach (nausea). °· Throwing up (vomiting). °· Abnormal stools. °DIAGNOSIS  °Your caregiver will take a history and perform a physical exam. Since many things can cause abdominal pain, other tests may be necessary. Tests may include: °· Blood tests. °· Urine tests. °· X-ray of the abdomen. °· CT scan of the abdomen. °Sometimes, surgery is needed to determine if diverticulitis or other conditions are causing your symptoms. °TREATMENT  °Most of the time, you can be treated without surgery. Treatment includes: °· Resting the bowels by only having liquids for a few days. As you improve, you will need to eat a low-fiber diet. °· Intravenous (IV) fluids if you are losing body fluids (dehydrated). °· Antibiotic medicines that treat infections may be given. °· Pain and nausea medicine, if needed. °· Surgery if the inflamed diverticulum has burst. °HOME CARE INSTRUCTIONS  °· Try a clear liquid diet (broth, tea, or water for as long as directed by your caregiver). You may then gradually begin a low-fiber diet as tolerated.  °A low-fiber diet is a diet with less than 10 grams of fiber. Choose the foods below to reduce fiber in the diet: °· White breads, cereals, rice, and pasta. °· Cooked fruits and vegetables or soft fresh fruits and vegetables without the skin. °· Ground or  well-cooked tender beef, ham, veal, lamb, pork, or poultry. °· Eggs and seafood. °· After your diverticulitis symptoms have improved, your caregiver may put you on a high-fiber diet. A high-fiber diet includes 14 grams of fiber for every 1000 calories consumed. For a standard 2000 calorie diet, you would need 28 grams of fiber. Follow these diet guidelines to help you increase the fiber in your diet. It is important to slowly increase the amount fiber in your diet to avoid gas, constipation, and bloating. °· Choose whole-grain breads, cereals, pasta, and brown rice. °· Choose fresh fruits and vegetables with the skin on. Do not overcook vegetables because the more vegetables are cooked, the more fiber is lost. °· Choose more nuts, seeds, legumes, dried peas, beans, and lentils. °· Look for food products that have greater than 3 grams of fiber per serving on the Nutrition Facts label. °· Take all medicine as directed by your caregiver. °· If your caregiver has given you a follow-up appointment, it is very important that you go. Not going could result in lasting (chronic) or permanent injury, pain, and disability. If there is any problem keeping the appointment, call to reschedule. °SEEK MEDICAL CARE IF:  °· Your pain does not improve. °· You have a hard time advancing your diet beyond clear liquids. °· Your bowel movements do not return to normal. °SEEK IMMEDIATE MEDICAL CARE IF:  °· Your pain becomes worse. °· You have an oral temperature above 102° F (38.9° C), not controlled by medicine. °· You have repeated vomiting. °· You have bloody or black, tarry stools. °·   Symptoms that brought you to your caregiver become worse or are not getting better. °MAKE SURE YOU:  °· Understand these instructions. °· Will watch your condition. °· Will get help right away if you are not doing well or get worse. °Document Released: 01/20/2005 Document Revised: 07/05/2011 Document Reviewed: 05/18/2010 °[employer]® Patient Information  ©2014 [employer]. ° °

## 2013-10-01 ENCOUNTER — Encounter: Payer: Self-pay | Admitting: Internal Medicine

## 2013-10-01 ENCOUNTER — Ambulatory Visit (INDEPENDENT_AMBULATORY_CARE_PROVIDER_SITE_OTHER): Payer: 59 | Admitting: Internal Medicine

## 2013-10-01 VITALS — BP 110/70 | HR 68 | Temp 98.2°F | Wt 151.0 lb

## 2013-10-01 DIAGNOSIS — K5792 Diverticulitis of intestine, part unspecified, without perforation or abscess without bleeding: Secondary | ICD-10-CM

## 2013-10-01 DIAGNOSIS — K5732 Diverticulitis of large intestine without perforation or abscess without bleeding: Secondary | ICD-10-CM

## 2013-10-01 MED ORDER — CEFTRIAXONE SODIUM 1 G IJ SOLR
1.0000 g | Freq: Once | INTRAMUSCULAR | Status: AC
  Administered 2013-10-01: 1 g via INTRAMUSCULAR

## 2013-10-01 NOTE — Addendum Note (Signed)
Addended by: Despina Hidden on: 10/01/2013 01:02 PM   Modules accepted: Orders

## 2013-10-01 NOTE — Progress Notes (Signed)
Pre visit review using our clinic review tool, if applicable. No additional management support is needed unless otherwise documented below in the visit note. 

## 2013-10-01 NOTE — Assessment & Plan Note (Signed)
Fairly severe ER records reviewed No signs of rupture and he feels the pain is at least a little better He has some more percocet to take-but tries to limit it  Will give rocephin IM here to augment current oral therapy Continue the cipro and flagyl Consider change to augmentin if trouble tolerating this

## 2013-10-01 NOTE — Patient Instructions (Signed)
Diverticulitis °A diverticulum is a small pouch or sac on the colon. Diverticulosis is the presence of these diverticula on the colon. Diverticulitis is the irritation (inflammation) or infection of diverticula. °CAUSES  °The colon and its diverticula contain bacteria. If food particles block the tiny opening to a diverticulum, the bacteria inside can grow and cause an increase in pressure. This leads to infection and inflammation and is called diverticulitis. °SYMPTOMS  °· Abdominal pain and tenderness. Usually, the pain is located on the left side of your abdomen. However, it could be located elsewhere. °· Fever. °· Bloating. °· Feeling sick to your stomach (nausea). °· Throwing up (vomiting). °· Abnormal stools. °DIAGNOSIS  °Your caregiver will take a history and perform a physical exam. Since many things can cause abdominal pain, other tests may be necessary. Tests may include: °· Blood tests. °· Urine tests. °· X-ray of the abdomen. °· CT scan of the abdomen. °Sometimes, surgery is needed to determine if diverticulitis or other conditions are causing your symptoms. °TREATMENT  °Most of the time, you can be treated without surgery. Treatment includes: °· Resting the bowels by only having liquids for a few days. As you improve, you will need to eat a low-fiber diet. °· Intravenous (IV) fluids if you are losing body fluids (dehydrated). °· Antibiotic medicines that treat infections may be given. °· Pain and nausea medicine, if needed. °· Surgery if the inflamed diverticulum has burst. °HOME CARE INSTRUCTIONS  °· Try a clear liquid diet (broth, tea, or water for as long as directed by your caregiver). You may then gradually begin a low-fiber diet as tolerated.  °A low-fiber diet is a diet with less than 10 grams of fiber. Choose the foods below to reduce fiber in the diet: °· White breads, cereals, rice, and pasta. °· Cooked fruits and vegetables or soft fresh fruits and vegetables without the skin. °· Ground or  well-cooked tender beef, ham, veal, lamb, pork, or poultry. °· Eggs and seafood. °· After your diverticulitis symptoms have improved, your caregiver may put you on a high-fiber diet. A high-fiber diet includes 14 grams of fiber for every 1000 calories consumed. For a standard 2000 calorie diet, you would need 28 grams of fiber. Follow these diet guidelines to help you increase the fiber in your diet. It is important to slowly increase the amount fiber in your diet to avoid gas, constipation, and bloating. °· Choose whole-grain breads, cereals, pasta, and brown rice. °· Choose fresh fruits and vegetables with the skin on. Do not overcook vegetables because the more vegetables are cooked, the more fiber is lost. °· Choose more nuts, seeds, legumes, dried peas, beans, and lentils. °· Look for food products that have greater than 3 grams of fiber per serving on the Nutrition Facts label. °· Take all medicine as directed by your caregiver. °· If your caregiver has given you a follow-up appointment, it is very important that you go. Not going could result in lasting (chronic) or permanent injury, pain, and disability. If there is any problem keeping the appointment, call to reschedule. °SEEK MEDICAL CARE IF:  °· Your pain does not improve. °· You have a hard time advancing your diet beyond clear liquids. °· Your bowel movements do not return to normal. °SEEK IMMEDIATE MEDICAL CARE IF:  °· Your pain becomes worse. °· You have an oral temperature above 102° F (38.9° C), not controlled by medicine. °· You have repeated vomiting. °· You have bloody or black, tarry stools. °·   Symptoms that brought you to your caregiver become worse or are not getting better. °MAKE SURE YOU:  °· Understand these instructions. °· Will watch your condition. °· Will get help right away if you are not doing well or get worse. °Document Released: 01/20/2005 Document Revised: 07/05/2011 Document Reviewed: 05/18/2010 °[employer]® Patient Information  ©2014 [employer]. ° °

## 2013-10-01 NOTE — Progress Notes (Signed)
Subjective:    Patient ID: [name], male    DOB: Jul 05, 1950, 63 y.o.   MRN: [id number]  HPI Here with wife  Started 3 days ago with pain in back after mowing lawn Both sides Then localized to LLL quadrant by the next morning Really bad by early afternoon 6/6 Went to ER when it started radiating and was worse Couldn't walk No N/V Appetite poor for some time--no change with this  Diagnosed with diverticulitis in ER Records reviewed and CT scan Very similar to past diverticulitis  Started on cipro/flagyl after initial IV therapy  Still having pain Took a percocet 8AM today--helps but doesn't last for long Worse with walking some Loose stools since ER visit Eating a little No nausea but has trouble getting the flagyl down  Current Outpatient Prescriptions on File Prior to Visit  Medication Sig Dispense Refill  . aspirin EC 325 MG tablet Take 325 mg by mouth daily.      . carvedilol (COREG) 3.125 MG tablet Take 3.125 mg by mouth 2 (two) times daily with a meal.      . ciprofloxacin (CIPRO) 500 MG tablet Take 1 tablet (500 mg total) by mouth 2 (two) times daily. For 10 days  20 tablet  0  . isosorbide mononitrate (IMDUR) 30 MG 24 hr tablet Take 30 mg by mouth at bedtime.      Marland Kitchen lisinopril (PRINIVIL,ZESTRIL) 5 MG tablet Take 1 tablet (5 mg total) by mouth daily.  30 tablet  6  . metroNIDAZOLE (FLAGYL) 500 MG tablet Take 1 tablet (500 mg total) by mouth 3 (three) times daily.  30 tablet  0  . mirtazapine (REMERON) 15 MG tablet Take 15 mg by mouth at bedtime as needed (sleep).      . naproxen sodium (ALEVE) 220 MG tablet Take 220 mg by mouth daily as needed (pain).      . nitroGLYCERIN (NITROSTAT) 0.4 MG SL tablet Place 0.4 mg under the tongue every 5 (five) minutes as needed for chest pain.      Marland Kitchen omeprazole (PRILOSEC OTC) 20 MG tablet Take 20 mg by mouth daily.      . ondansetron (ZOFRAN) 4 MG tablet Take 1 tablet (4 mg total) by mouth every 8 (eight) hours as needed for  nausea or vomiting.  20 tablet  0  . oxyCODONE-acetaminophen (PERCOCET/ROXICET) 5-325 MG per tablet Take 1 tablet by mouth every 6 (six) hours as needed for severe pain.  20 tablet  0  . prasugrel (EFFIENT) 10 MG TABS tablet Take 1 tablet (10 mg total) by mouth daily.  30 tablet  6  . rosuvastatin (CRESTOR) 40 MG tablet Take 1 tablet (40 mg total) by mouth at bedtime.  30 tablet  3   No current facility-administered medications on file prior to visit.    Allergies  Allergen Reactions  . Metoprolol Tartrate Hives and Itching    Past Medical History  Diagnosis Date  . CAD (coronary artery disease)     a. 1995 s/p CABG x 4: LIMA->LAD, VG->RI (known to be occluded), VG->AM->PDA;  b. 05/2002 Inf STEMI: VG->AM->PDA 100% treated w/ 2 BMS complicated by acute thrombosis req 3 BMS;  c. 07/2003 DES to  LAD & LCX (VG's to PDA & RI 100%);  d. 12/2010 Acute MI ([place]): DES to LCX & LM , LIMA ok,;  e.12/2011 Cath: LM/LCX stents ok , LIMA patent.  Marland Kitchen GERD (gastroesophageal reflux disease)   . Hyperlipidemia   . Hypertension   .  Diverticulitis     1/06 Diverticulitis--CT of pelvis--diffuse sigmoid divertic  . AAA (abdominal aortic aneurysm)     a. Duplex 01/2012: stable infrarenal saccular AAA at 3.3cm x 3.2xm (f/u recommended 01/2013 per Dr. Rockey Situ)  . Renal artery stenosis     a. 03/2011 PTA and stenting of L RA. b. 01/2012 renal duplex - stable 1-59% R renal artery stenosis, normal L renal artery s/p stent.  . Back pain   . Myocardial infarction   . Anginal pain   . Shortness of breath   . AAA (abdominal aortic aneurysm) without rupture 09/20/2013    Past Surgical History  Procedure Laterality Date  . Coronary artery bypass graft    . Closed reduction shoulder dislocation    . Angioplasty  1997  . Coronary stent placement  7/12    2 stents--Promus element plus (everolimus eluting)--[facility] in [place]  . Cardiac catheterization      8/09  Cath--vein graft occlusions which are old--no  acute changes  . Cardiac catheterization  11/13/2010    stent x 2 @ [place]  . Renal artery stent  03/2011 ?    Family History  Problem Relation Age of Onset  . Coronary artery disease Mother     Died MI age 30  . Hypertension Mother   . Coronary artery disease Sister     Living  . Coronary artery disease Brother     Living  . Diabetes Neg Hx   . Cancer Neg Hx     prostate or colon  . Coronary artery disease Father     Died MI age 47    History   Social History  . Marital Status: Married    Spouse Name: N/A    Number of Children: 3  . Years of Education: N/A   Occupational History  . Geneticist, molecular (Animal nutritionist)     own business now   Social History Main Topics  . Smoking status: Former Smoker -- 3.00 packs/day for 25 years    Types: Cigarettes    Quit date: 05/28/1993  . Smokeless tobacco: Never Used  . Alcohol Use: 10.8 oz/week    18 Cans of beer per week     Comment: every day - 3-6 beers  . Drug Use: No  . Sexual Activity: Not on file   Other Topics Concern  . Not on file   Social History Narrative  . No narrative on file   Review of Systems No fever Felt hot around 4AM today---no sweats    Objective:   Physical Exam  Constitutional: He appears well-developed and well-nourished. No distress.  Pulmonary/Chest: Effort normal and breath sounds normal. No respiratory distress. He has no wheezes. He has no rales.  Abdominal: Soft. Bowel sounds are normal. He exhibits no distension. There is tenderness. There is no rebound and no guarding.  Bowel sounds present but decreased Mild LLL guarding with moderate localized tenderness Mild referred pain with palpation on right but no rebound          Assessment & Plan:

## 2013-10-04 ENCOUNTER — Ambulatory Visit (INDEPENDENT_AMBULATORY_CARE_PROVIDER_SITE_OTHER): Payer: 59 | Admitting: Family Medicine

## 2013-10-04 ENCOUNTER — Encounter: Payer: Self-pay | Admitting: Family Medicine

## 2013-10-04 VITALS — BP 144/90 | HR 68 | Temp 97.6°F | Wt 150.0 lb

## 2013-10-04 DIAGNOSIS — K5792 Diverticulitis of intestine, part unspecified, without perforation or abscess without bleeding: Secondary | ICD-10-CM

## 2013-10-04 DIAGNOSIS — K5732 Diverticulitis of large intestine without perforation or abscess without bleeding: Secondary | ICD-10-CM

## 2013-10-04 MED ORDER — AMOXICILLIN-POT CLAVULANATE 875-125 MG PO TABS: 1.0000 | ORAL_TABLET | Freq: Two times a day (BID) | ORAL | Status: DC

## 2013-10-04 NOTE — Patient Instructions (Signed)
Stop the cipro and flagyl. Start augmentin today and you should be able to tolerate that better.  Clear liquids for now, gradually advance your diet as tolerated as the pain improves.  Take care.   If you have a profound increase in pain, then go to the ER.

## 2013-10-04 NOTE — Progress Notes (Signed)
Pre visit review using our clinic review tool, if applicable. No additional management support is needed unless otherwise documented below in the visit note.  Prev notes reviewed re: diverticulitis with CT scan at ER, started on cipro/flagyl.  The LLQ pain is some better in the meantime.  Taking abx, noted some difficulty with flagyl but able to get it down- nauseated.  His appetite is down a lot and that may be affecting his tolerance of the flagyl.  On low residue diet now. No fevers.  No vomiting.  No blood in stool. Still with BMs, watery.  Rare use of oxycodone.    Meds, vitals, and allergies reviewed.   ROS: See HPI.  Otherwise, noncontributory.  nad ncat Mmm rrr ctab abd soft, LLL mildly ttp, no rebound, normal BS Ext w/o edema

## 2013-10-05 NOTE — Assessment & Plan Note (Signed)
Not worse, some better, but with trouble taking flagyl.  Change to augmentin and him notify us as needed.  He agrees. Okay for outpatient f/u.

## 2013-10-09 ENCOUNTER — Other Ambulatory Visit: Payer: Self-pay

## 2013-10-09 MED ORDER — LISINOPRIL 5 MG PO TABS: 5.0000 mg | ORAL_TABLET | Freq: Every day | ORAL | Status: DC

## 2013-10-09 MED ORDER — ISOSORBIDE MONONITRATE ER 30 MG PO TB24: 30.0000 mg | ORAL_TABLET | Freq: Every day | ORAL | Status: DC

## 2013-10-09 NOTE — Telephone Encounter (Signed)
Refill sent for lisinopril  

## 2013-10-09 NOTE — Telephone Encounter (Signed)
Refill sent for isosorbide mono 30 mg take one tablet daily.

## 2013-10-10 ENCOUNTER — Encounter: Payer: Self-pay | Admitting: Cardiovascular Disease

## 2013-10-10 ENCOUNTER — Ambulatory Visit (INDEPENDENT_AMBULATORY_CARE_PROVIDER_SITE_OTHER): Payer: 59 | Admitting: Cardiovascular Disease

## 2013-10-10 VITALS — BP 136/90 | HR 53 | Ht 65.0 in | Wt 151.5 lb

## 2013-10-10 DIAGNOSIS — I714 Abdominal aortic aneurysm, without rupture, unspecified: Secondary | ICD-10-CM

## 2013-10-10 DIAGNOSIS — I2510 Atherosclerotic heart disease of native coronary artery without angina pectoris: Secondary | ICD-10-CM

## 2013-10-10 DIAGNOSIS — I701 Atherosclerosis of renal artery: Secondary | ICD-10-CM

## 2013-10-10 DIAGNOSIS — I10 Essential (primary) hypertension: Secondary | ICD-10-CM

## 2013-10-10 DIAGNOSIS — E785 Hyperlipidemia, unspecified: Secondary | ICD-10-CM

## 2013-10-10 MED ORDER — PRASUGREL HCL 10 MG PO TABS: 10.0000 mg | ORAL_TABLET | Freq: Every day | ORAL | Status: DC

## 2013-10-10 NOTE — Assessment & Plan Note (Signed)
Currently with no symptoms of angina. No further workup at this time. Continue current medication regimen. 

## 2013-10-10 NOTE — Patient Instructions (Signed)
You are doing well. No medication changes were made.  Please call us if you have new issues that need to be addressed before your next appt.  Your physician wants you to follow-up in: 6 months.  You will receive a reminder letter in the mail two months in advance. If you don't receive a letter, please call our office to schedule the follow-up appointment.   

## 2013-10-10 NOTE — Assessment & Plan Note (Signed)
Blood pressure is well controlled on today's visit. No changes made to the medications. 

## 2013-10-10 NOTE — Assessment & Plan Note (Signed)
Status post renal artery stenting, prior ultrasound showing mild residual disease

## 2013-10-10 NOTE — Assessment & Plan Note (Signed)
Stable 3.2 cm AAA. Will likely need periodic evaluation every 2 years

## 2013-10-10 NOTE — Progress Notes (Signed)
Patient ID: [name], male    DOB: 1950/08/06, 63 y.o.   MRN: [id number]  HPI Comments: [name] is a 63 year old gentleman with coronary artery disease, bypass surgery in 1995, stenting at [facility] in February 2004 for MI with a 3.0 x 25 mm and 4.5 x 16 mm Monorail ( location uncertain), also 4.5 x 18 mm stent placed to the mid RCA, followup with repeat stenting in April 2005 to the proximal LAD with a Taxus stent 2.5 x 8 mm, and stenting to the proximal left circumflex with a 2.5 x 12 mm Taxus, with recent stent placed November 13, 2010 with a 4.0 x 9 mm stent placed to the left main. He presents for routine followup. History of renal artery stent. Abdominal aortic aneurysm 3.2 cm  In followup today, he reports that he is been doing well in terms of his cardiac issues He does report having bronchitis several weeks ago requiring antibiotics. After recovering from this he developed diverticulitis and was started on Cipro and Flagyl in the emergency room Today he feels well with no complaints. Reports that he is active with no shortness of breath or chest discomfort  Previous episode of chest pain while in [place] with hospitalization at that time, negative stress test, 2013 recurrent chest pain requiring admission to [facility], repeat catheterization performed 12/2011 This showed patent stents to the left circumflex and LAD with patent LIMA to the LAD. Vein graft to the OM and vein graft to the RCA was occluded  admission to [place] at the beginning of July 2014. Notes from the hospital were reviewed. Ruled out for MI with negative cardiac enzymes, CT scan chest showing no PE. He had stress test 10/25/2012 showing old inferior wall MI with defect noted in the basal to mid inferior wall. Discharged on tramadol.   Ultrasound of his aorta in [place] 12/22/2011 showed distal aorta 3 cm, iliacs of normal size Previous lower extremity ultrasound  showed no significant disease  He  used to be a smoker though stopped in 1995. Prior to that he smoked 3 packs per day. Smoked for approximately 25-28 years Recent lab work showed total cholesterol 106, LDL 50  EKG shows normal sinus rhythm with rate 74 beats per minute with nonspecific ST and T wave abnormality in the anterolateral leads, inferior leads, PVC noted         Outpatient Encounter Prescriptions as of 10/10/2013  Medication Sig  . amoxicillin-clavulanate (AUGMENTIN) 875-125 MG per tablet Take 1 tablet by mouth 2 (two) times daily.  Marland Kitchen aspirin EC 325 MG tablet Take 325 mg by mouth daily.  . carvedilol (COREG) 3.125 MG tablet Take 3.125 mg by mouth 2 (two) times daily with a meal.  . isosorbide mononitrate (IMDUR) 30 MG 24 hr tablet Take 1 tablet (30 mg total) by mouth at bedtime.  Marland Kitchen lisinopril (PRINIVIL,ZESTRIL) 5 MG tablet Take 1 tablet (5 mg total) by mouth daily.  . mirtazapine (REMERON) 15 MG tablet Take 15 mg by mouth at bedtime as needed (sleep).  . naproxen sodium (ALEVE) 220 MG tablet Take 220 mg by mouth daily as needed (pain).  . nitroGLYCERIN (NITROSTAT) 0.4 MG SL tablet Place 0.4 mg under the tongue every 5 (five) minutes as needed for chest pain.  Marland Kitchen omeprazole (PRILOSEC OTC) 20 MG tablet Take 20 mg by mouth daily.  . prasugrel (EFFIENT) 10 MG TABS tablet Take 1 tablet (10 mg total) by mouth daily.  . rosuvastatin (CRESTOR) 40 MG  tablet Take 1 tablet (40 mg total) by mouth at bedtime.   Review of Systems  Constitutional: Negative.   HENT: Negative.   Eyes: Negative.   Respiratory: Negative.   Cardiovascular: Negative.   Gastrointestinal: Negative.   Endocrine: Negative.   Musculoskeletal: Positive for back pain and gait problem.       Bilateral leg pain  Skin: Negative.   Allergic/Immunologic: Negative.   Neurological: Negative.   Hematological: Negative.   Psychiatric/Behavioral: Negative.   All other systems reviewed and are negative.   BP 136/90  Pulse 53  Ht 5\' 5"  (1.651 m)  Wt 151  lb 8 oz (68.72 kg)  BMI 25.21 kg/m2  Physical Exam  Nursing note and vitals reviewed. Constitutional: He is oriented to person, place, and time. He appears well-developed and well-nourished.  HENT:  Head: Normocephalic.  Nose: Nose normal.  Mouth/Throat: Oropharynx is clear and moist.  Eyes: Conjunctivae are normal. Pupils are equal, round, and reactive to light.  Neck: Normal range of motion. Neck supple. No JVD present.  Cardiovascular: Normal rate, regular rhythm, S1 normal, S2 normal, normal heart sounds and intact distal pulses.  Exam reveals no gallop and no friction rub.   No murmur heard. Pulmonary/Chest: Effort normal and breath sounds normal. No respiratory distress. He has no wheezes. He has no rales. He exhibits no tenderness.  Abdominal: Soft. Bowel sounds are normal. He exhibits no distension. There is no tenderness.  Musculoskeletal: Normal range of motion. He exhibits no edema and no tenderness.  Lymphadenopathy:    He has no cervical adenopathy.  Neurological: He is alert and oriented to person, place, and time. Coordination normal.  Skin: Skin is warm and dry. No rash noted. No erythema.  Psychiatric: He has a normal mood and affect. His behavior is normal. Judgment and thought content normal.      Assessment and Plan

## 2013-10-10 NOTE — Assessment & Plan Note (Signed)
Cholesterol is at goal on the current lipid regimen. No changes to the medications were made.  

## 2013-12-19 ENCOUNTER — Ambulatory Visit (INDEPENDENT_AMBULATORY_CARE_PROVIDER_SITE_OTHER): Payer: 59 | Admitting: Internal Medicine

## 2013-12-19 ENCOUNTER — Encounter: Payer: Self-pay | Admitting: Internal Medicine

## 2013-12-19 VITALS — BP 138/80 | HR 61 | Temp 97.7°F | Ht 65.5 in | Wt 149.0 lb

## 2013-12-19 DIAGNOSIS — Z0000 Encounter for general adult medical examination without abnormal findings: Secondary | ICD-10-CM

## 2013-12-19 DIAGNOSIS — Z1211 Encounter for screening for malignant neoplasm of colon: Secondary | ICD-10-CM

## 2013-12-19 DIAGNOSIS — I10 Essential (primary) hypertension: Secondary | ICD-10-CM

## 2013-12-19 DIAGNOSIS — Z125 Encounter for screening for malignant neoplasm of prostate: Secondary | ICD-10-CM

## 2013-12-19 DIAGNOSIS — Z23 Encounter for immunization: Secondary | ICD-10-CM

## 2013-12-19 DIAGNOSIS — K219 Gastro-esophageal reflux disease without esophagitis: Secondary | ICD-10-CM

## 2013-12-19 DIAGNOSIS — I2510 Atherosclerotic heart disease of native coronary artery without angina pectoris: Secondary | ICD-10-CM

## 2013-12-19 DIAGNOSIS — E785 Hyperlipidemia, unspecified: Secondary | ICD-10-CM

## 2013-12-19 LAB — LIPID PANEL
CHOLESTEROL: 206 mg/dL — AB (ref 0–200)
HDL: 38.1 mg/dL — AB (ref >39.00)
NonHDL: 167.9
Total CHOL/HDL Ratio: 5
Triglycerides: 373 mg/dL — ABNORMAL HIGH (ref 0.0–149.0)
VLDL: 74.6 mg/dL — ABNORMAL HIGH (ref 0.0–40.0)

## 2013-12-19 LAB — PSA: PSA: 0.22 ng/mL (ref 0.10–4.00)

## 2013-12-19 LAB — LDL CHOLESTEROL, DIRECT: LDL DIRECT: 114.4 mg/dL

## 2013-12-19 MED ORDER — ZOSTER VACCINE LIVE 19400 UNT/0.65ML ~~LOC~~ SOLR: 0.6500 mL | Freq: Once | SUBCUTANEOUS | Status: DC

## 2013-12-19 NOTE — Assessment & Plan Note (Signed)
BP Readings from Last 3 Encounters:  12/19/13 138/80  10/10/13 136/90  10/04/13 144/90   Good control No changes needed

## 2013-12-19 NOTE — Addendum Note (Signed)
Addended by: Despina Hidden on: 12/19/2013 09:54 AM   Modules accepted: Orders

## 2013-12-19 NOTE — Progress Notes (Signed)
Pre visit review using our clinic review tool, if applicable. No additional management support is needed unless otherwise documented below in the visit note. 

## 2013-12-19 NOTE — Assessment & Plan Note (Signed)
Lab Results  Component Value Date   LDLCALC 50 10/26/2012   If LDL still under 70, will try cutting in half to see if it helps his leg muscle pain

## 2013-12-19 NOTE — Assessment & Plan Note (Signed)
Controlled with daily PPI 

## 2013-12-19 NOTE — Assessment & Plan Note (Signed)
Doing okay without recent symptoms

## 2013-12-19 NOTE — Patient Instructions (Signed)
If your LDL cholesterol is still under 70, it would be okay to try cutting your crestor in half and taking 20mg  a day for a while---to see if that helps your leg muscle pain.

## 2013-12-19 NOTE — Assessment & Plan Note (Signed)
Doing okay Discussed PSA--will check Fecal immunoassay Try to eat more regularly

## 2013-12-19 NOTE — Progress Notes (Signed)
Subjective:    Patient ID: [name], male    DOB: [birth year], 63 y.o.   MRN: [id number]  HPI Here for physical Stress with wife having ministroke 10 days ago He is doing okay though  Heart has been fine Still has some leg pain--they ache even at rest. Not worse with exertion. The aching may keep him from walking at times. Seems to be muscular. 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 carvedilol (COREG) 3.125 MG tablet Take 3.125 mg by mouth 2 (two) times daily with a meal.      . isosorbide mononitrate (IMDUR) 30 MG 24 hr tablet Take 1 tablet (30 mg total) by mouth at bedtime.  30 tablet  6  . lisinopril (PRINIVIL,ZESTRIL) 5 MG tablet Take 1 tablet (5 mg total) by mouth daily.  30 tablet  6  . mirtazapine (REMERON) 15 MG tablet Take 15 mg by mouth at bedtime as needed (sleep).      . naproxen sodium (ALEVE) 220 MG tablet Take 220 mg by mouth daily as needed (pain).      . nitroGLYCERIN (NITROSTAT) 0.4 MG SL tablet Place 0.4 mg under the tongue every 5 (five) minutes as needed for chest pain.      Marland Kitchen omeprazole (PRILOSEC OTC) 20 MG tablet Take 20 mg by mouth daily.      . prasugrel (EFFIENT) 10 MG TABS tablet Take 1 tablet (10 mg total) by mouth daily.  30 tablet  6  . rosuvastatin (CRESTOR) 40 MG tablet Take 1 tablet (40 mg total) by mouth at bedtime.  30 tablet  3   No current facility-administered medications on file prior to visit.    Allergies  Allergen Reactions  . Metoprolol Tartrate Hives and Itching    Past Medical History  Diagnosis Date  . CAD (coronary artery disease)     a. 1995 s/p CABG x 4: LIMA->LAD, VG->RI (known to be occluded), VG->AM->PDA;  b. 05/2002 Inf STEMI: VG->AM->PDA 100% treated w/ 2 BMS complicated by acute thrombosis req 3 BMS;  c. 07/2003 DES to  LAD & LCX (VG's to PDA & RI 100%);  d. 12/2010 Acute MI ([place]): DES to LCX & LM ,  LIMA ok,;  e.12/2011 Cath: LM/LCX stents ok , LIMA patent.  Marland Kitchen GERD (gastroesophageal reflux disease)   . Hyperlipidemia   . Hypertension   . Diverticulitis     1/06 Diverticulitis--CT of pelvis--diffuse sigmoid divertic  . AAA (abdominal aortic aneurysm)     a. Duplex 01/2012: stable infrarenal saccular AAA at 3.3cm x 3.2xm (f/u recommended 01/2013 per Dr. Rockey Situ)  . Renal artery stenosis     a. 03/2011 PTA and stenting of L RA. b. 01/2012 renal duplex - stable 1-59% R renal artery stenosis, normal L renal artery s/p stent.  . Back pain   . Myocardial infarction   . Anginal pain   . Shortness of breath   . AAA (abdominal aortic aneurysm) without rupture 09/20/2013  . Diverticulitis     Past Surgical History  Procedure Laterality Date  . Coronary artery bypass graft    . Closed reduction shoulder dislocation    . Angioplasty  1997  . Coronary stent placement  7/12    2 stents--Promus element plus (everolimus eluting)--[facility] in [place]  . Cardiac catheterization      8/09  Cath--vein graft occlusions which  are old--no acute changes  . Cardiac catheterization  11/13/2010    stent x 2 @ [place]  . Renal artery stent  03/2011 ?    Family History  Problem Relation Age of Onset  . Coronary artery disease Mother     Died MI age 35  . Hypertension Mother   . Coronary artery disease Sister     Living  . Coronary artery disease Brother     Living  . Diabetes Neg Hx   . Cancer Neg Hx     prostate or colon  . Coronary artery disease Father     Died MI age 43    History   Social History  . Marital Status: Married    Spouse Name: N/A    Number of Children: 3  . Years of Education: N/A   Occupational History  . Geneticist, molecular (Animal nutritionist)     own business now   Social History Main Topics  . Smoking status: Former Smoker -- 3.00 packs/day for 25 years    Types: Cigarettes    Quit date: 05/28/1993  . Smokeless tobacco: Never Used  . Alcohol  Use: 10.8 oz/week    18 Cans of beer per week     Comment: every day - 3-6 beers  . Drug Use: No  . Sexual Activity: Not on file   Other Topics Concern  . Not on file   Social History Narrative  . No narrative on file   Review of Systems  Constitutional: Negative for fatigue and unexpected weight change.       Wears seat belt Appetite is not good---generally only eats 1 meal a day  HENT: Positive for dental problem and hearing loss. Negative for tinnitus.        ?mild hearing loss Poor teeth--not really keeping up with dentist  Eyes: Negative for visual disturbance.       No diplopia or unilateral vision loss  Respiratory: Positive for shortness of breath. Negative for cough and chest tightness.        Stable DOE  Cardiovascular: Negative for chest pain, palpitations and leg swelling.  Gastrointestinal: Positive for diarrhea. Negative for nausea, vomiting and blood in stool.       Heartburn controlled May have 4-5 loose stools a day. Rare incontinence.  Endocrine: Negative for cold intolerance and heat intolerance.  Genitourinary: Positive for urgency. Negative for difficulty urinating.       No sex --no problem  Musculoskeletal: Positive for arthralgias, back pain and myalgias. Negative for joint swelling.       Chronic back pain after injury 30 years ago Some left hip stiffness in AM  Skin: Negative for rash.       No suspicious lesions  Allergic/Immunologic: Positive for environmental allergies. Negative for immunocompromised state.       Spring season--no meds  Neurological: Negative for dizziness, syncope, weakness, light-headedness, numbness and headaches.  Hematological: Negative for adenopathy. Bruises/bleeds easily.  Psychiatric/Behavioral: Negative for sleep disturbance and dysphoric mood. The patient is not nervous/anxious.        Objective:   Physical Exam  Constitutional: He is oriented to person, place, and time. He appears well-developed and well-nourished.  No distress.  HENT:  Head: Normocephalic and atraumatic.  Right Ear: External ear normal.  Left Ear: External ear normal.  Mouth/Throat: Oropharynx is clear and moist. No oropharyngeal exudate.  Eyes: Conjunctivae and EOM are normal. Pupils are equal, round, and reactive to light.  Neck: Normal  range of motion. Neck supple. No thyromegaly present.  Cardiovascular: Normal rate, regular rhythm and normal heart sounds.  Exam reveals no gallop.   No murmur heard. Faint pulse in right foot, absent on left  Pulmonary/Chest: Effort normal and breath sounds normal. No respiratory distress. He has no wheezes. He has no rales.  Abdominal: Soft. There is no tenderness.  Musculoskeletal: He exhibits no edema and no tenderness.  Lymphadenopathy:    He has no cervical adenopathy.  Neurological: He is alert and oriented to person, place, and time.  Skin: No rash noted. No erythema.  Psychiatric: He has a normal mood and affect. His behavior is normal.          Assessment & Plan:

## 2014-02-26 ENCOUNTER — Other Ambulatory Visit: Payer: Self-pay | Admitting: Internal Medicine

## 2014-03-14 ENCOUNTER — Other Ambulatory Visit: Payer: Self-pay | Admitting: Internal Medicine

## 2014-03-15 ENCOUNTER — Other Ambulatory Visit (HOSPITAL_COMMUNITY): Payer: Self-pay | Admitting: *Deleted

## 2014-03-15 ENCOUNTER — Other Ambulatory Visit: Payer: Self-pay

## 2014-03-15 ENCOUNTER — Telehealth: Payer: Self-pay

## 2014-03-15 DIAGNOSIS — I714 Abdominal aortic aneurysm, without rupture, unspecified: Secondary | ICD-10-CM

## 2014-03-15 DIAGNOSIS — I701 Atherosclerosis of renal artery: Secondary | ICD-10-CM

## 2014-03-15 NOTE — Telephone Encounter (Signed)
Pt would like Effient and Crestor samples

## 2014-03-15 NOTE — Telephone Encounter (Signed)
Notified patient samples of effient available to pick up. There is no samples of crestor.

## 2014-04-03 ENCOUNTER — Encounter (HOSPITAL_COMMUNITY): Payer: Self-pay | Admitting: Cardiovascular Disease

## 2014-04-04 ENCOUNTER — Emergency Department (HOSPITAL_COMMUNITY): Payer: BC Managed Care – PPO

## 2014-04-04 ENCOUNTER — Encounter (HOSPITAL_COMMUNITY): Payer: Self-pay | Admitting: Cardiovascular Disease

## 2014-04-04 ENCOUNTER — Inpatient Hospital Stay (HOSPITAL_COMMUNITY)
Admission: EM | Admit: 2014-04-04 | Discharge: 2014-04-06 | DRG: 247 | Disposition: A | Discharge status: Home / Self Care | Payer: BC Managed Care – PPO | Attending: Cardiovascular Disease | Admitting: Cardiovascular Disease

## 2014-04-04 ENCOUNTER — Telehealth: Payer: Self-pay

## 2014-04-04 DIAGNOSIS — E785 Hyperlipidemia, unspecified: Secondary | ICD-10-CM | POA: Diagnosis present

## 2014-04-04 DIAGNOSIS — Z7902 Long term (current) use of antithrombotics/antiplatelets: Secondary | ICD-10-CM

## 2014-04-04 DIAGNOSIS — I25700 Atherosclerosis of coronary artery bypass graft(s), unspecified, with unstable angina pectoris: Secondary | ICD-10-CM

## 2014-04-04 DIAGNOSIS — Y831 Surgical operation with implant of artificial internal device as the cause of abnormal reaction of the patient, or of later complication, without mention of misadventure at the time of the procedure: Secondary | ICD-10-CM | POA: Diagnosis present

## 2014-04-04 DIAGNOSIS — T82858A Stenosis of vascular prosthetic devices, implants and grafts, initial encounter: Secondary | ICD-10-CM | POA: Diagnosis not present

## 2014-04-04 DIAGNOSIS — I252 Old myocardial infarction: Secondary | ICD-10-CM

## 2014-04-04 DIAGNOSIS — Z7982 Long term (current) use of aspirin: Secondary | ICD-10-CM

## 2014-04-04 DIAGNOSIS — I701 Atherosclerosis of renal artery: Secondary | ICD-10-CM | POA: Diagnosis present

## 2014-04-04 DIAGNOSIS — Z955 Presence of coronary angioplasty implant and graft: Secondary | ICD-10-CM

## 2014-04-04 DIAGNOSIS — K219 Gastro-esophageal reflux disease without esophagitis: Secondary | ICD-10-CM | POA: Diagnosis present

## 2014-04-04 DIAGNOSIS — Z87891 Personal history of nicotine dependence: Secondary | ICD-10-CM

## 2014-04-04 DIAGNOSIS — I25710 Atherosclerosis of autologous vein coronary artery bypass graft(s) with unstable angina pectoris: Secondary | ICD-10-CM | POA: Diagnosis present

## 2014-04-04 DIAGNOSIS — Z8249 Family history of ischemic heart disease and other diseases of the circulatory system: Secondary | ICD-10-CM

## 2014-04-04 DIAGNOSIS — Z960 Presence of urogenital implants: Secondary | ICD-10-CM | POA: Diagnosis present

## 2014-04-04 DIAGNOSIS — R0789 Other chest pain: Secondary | ICD-10-CM

## 2014-04-04 DIAGNOSIS — I25110 Atherosclerotic heart disease of native coronary artery with unstable angina pectoris: Principal | ICD-10-CM | POA: Diagnosis present

## 2014-04-04 DIAGNOSIS — I200 Unstable angina: Secondary | ICD-10-CM | POA: Diagnosis present

## 2014-04-04 DIAGNOSIS — Z888 Allergy status to other drugs, medicaments and biological substances status: Secondary | ICD-10-CM | POA: Diagnosis not present

## 2014-04-04 DIAGNOSIS — R001 Bradycardia, unspecified: Secondary | ICD-10-CM | POA: Diagnosis present

## 2014-04-04 DIAGNOSIS — I25119 Atherosclerotic heart disease of native coronary artery with unspecified angina pectoris: Secondary | ICD-10-CM | POA: Diagnosis present

## 2014-04-04 DIAGNOSIS — I714 Abdominal aortic aneurysm, without rupture, unspecified: Secondary | ICD-10-CM | POA: Diagnosis present

## 2014-04-04 DIAGNOSIS — I10 Essential (primary) hypertension: Secondary | ICD-10-CM | POA: Diagnosis present

## 2014-04-04 DIAGNOSIS — I25810 Atherosclerosis of coronary artery bypass graft(s) without angina pectoris: Secondary | ICD-10-CM | POA: Diagnosis present

## 2014-04-04 LAB — I-STAT TROPONIN, ED: TROPONIN I, POC: 0.03 ng/mL (ref 0.00–0.08)

## 2014-04-04 LAB — TROPONIN I
Troponin I: <0.3 ng/mL (ref <0.30)
Troponin I: <0.3 ng/mL (ref <0.30)

## 2014-04-04 LAB — PROTIME-INR
INR: 1.03 (ref 0.00–1.49)
Prothrombin Time: 13.6 seconds (ref 11.6–15.2)

## 2014-04-04 LAB — HEPATIC FUNCTION PANEL
ALBUMIN: 3.7 g/dL (ref 3.5–5.2)
ALK PHOS: 54 U/L (ref 39–117)
ALT: 18 U/L (ref 0–53)
AST: 23 U/L (ref 0–37)
BILIRUBIN TOTAL: 0.5 mg/dL (ref 0.3–1.2)
Bilirubin, Direct: <0.2 mg/dL (ref 0.0–0.3)
Total Protein: 7.1 g/dL (ref 6.0–8.3)

## 2014-04-04 LAB — T4, FREE: Free T4: 1.01 ng/dL (ref 0.80–1.80)

## 2014-04-04 LAB — MAGNESIUM: MAGNESIUM: 2.2 mg/dL (ref 1.5–2.5)

## 2014-04-04 LAB — CBC
HCT: 40 % (ref 39.0–52.0)
HEMOGLOBIN: 14 g/dL (ref 13.0–17.0)
MCH: 33.3 pg (ref 26.0–34.0)
MCHC: 35 g/dL (ref 30.0–36.0)
MCV: 95.2 fL (ref 78.0–100.0)
Platelets: 231 10*3/uL (ref 150–400)
RBC: 4.2 MIL/uL — ABNORMAL LOW (ref 4.22–5.81)
RDW: 13.9 % (ref 11.5–15.5)
WBC: 6 10*3/uL (ref 4.0–10.5)

## 2014-04-04 LAB — HEMOGLOBIN A1C
Hgb A1c MFr Bld: 5.8 % — ABNORMAL HIGH (ref <5.7)
Mean Plasma Glucose: 120 mg/dL — ABNORMAL HIGH (ref <117)

## 2014-04-04 LAB — BASIC METABOLIC PANEL
Anion gap: 14 (ref 5–15)
BUN: 5 mg/dL — ABNORMAL LOW (ref 6–23)
CHLORIDE: 99 meq/L (ref 96–112)
CO2: 22 mEq/L (ref 19–32)
Calcium: 9.3 mg/dL (ref 8.4–10.5)
Creatinine, Ser: 0.63 mg/dL (ref 0.50–1.35)
GFR calc non Af Amer: >90 mL/min (ref >90)
Glucose, Bld: 99 mg/dL (ref 70–99)
POTASSIUM: 4.1 meq/L (ref 3.7–5.3)
Sodium: 135 mEq/L — ABNORMAL LOW (ref 137–147)

## 2014-04-04 LAB — HEPARIN LEVEL (UNFRACTIONATED)
Heparin Unfractionated: 0.4 IU/mL (ref 0.30–0.70)
Heparin Unfractionated: 0.31 IU/mL (ref 0.30–0.70)

## 2014-04-04 LAB — MRSA PCR SCREENING: MRSA by PCR: NEGATIVE

## 2014-04-04 LAB — TSH: TSH: 4.01 u[IU]/mL (ref 0.350–4.500)

## 2014-04-04 LAB — APTT: aPTT: 27 seconds (ref 24–37)

## 2014-04-04 MED ORDER — ROSUVASTATIN CALCIUM 40 MG PO TABS
40.0000 mg | ORAL_TABLET | Freq: Every day | ORAL | Status: DC
  Administered 2014-04-04 – 2014-04-05 (×2): 40 mg via ORAL
  Filled 2014-04-04 (×2): qty 1
  Filled 2014-04-04: qty 4

## 2014-04-04 MED ORDER — ONDANSETRON HCL 4 MG/2ML IJ SOLN: 4.0000 mg | Freq: Four times a day (QID) | INTRAMUSCULAR | Status: DC | PRN

## 2014-04-04 MED ORDER — SODIUM CHLORIDE 0.9 % IV SOLN
INTRAVENOUS | Status: DC
  Administered 2014-04-04: 16:00:00 via INTRAVENOUS

## 2014-04-04 MED ORDER — HEPARIN BOLUS VIA INFUSION
3000.0000 [IU] | Freq: Once | INTRAVENOUS | Status: AC
  Administered 2014-04-04: 3000 [IU] via INTRAVENOUS
  Filled 2014-04-04: qty 3000

## 2014-04-04 MED ORDER — NITROGLYCERIN 0.4 MG SL SUBL
0.4000 mg | SUBLINGUAL_TABLET | SUBLINGUAL | Status: DC | PRN
Start: 2014-04-04 — End: 2014-04-04

## 2014-04-04 MED ORDER — NITROGLYCERIN 0.4 MG SL SUBL
0.4000 mg | SUBLINGUAL_TABLET | SUBLINGUAL | Status: DC | PRN
Start: 2014-04-04 — End: 2014-04-06

## 2014-04-04 MED ORDER — MIRTAZAPINE 15 MG PO TABS
15.0000 mg | ORAL_TABLET | Freq: Every evening | ORAL | Status: DC | PRN
  Administered 2014-04-05: 21:00:00 15 mg via ORAL
  Filled 2014-04-04 (×2): qty 1

## 2014-04-04 MED ORDER — NITROGLYCERIN IN D5W 200-5 MCG/ML-% IV SOLN
5.0000 ug/min | INTRAVENOUS | Status: DC
  Administered 2014-04-04: 5 ug/min via INTRAVENOUS
  Filled 2014-04-04: qty 250

## 2014-04-04 MED ORDER — SODIUM CHLORIDE 0.9 % IJ SOLN: 3.0000 mL | INTRAMUSCULAR | Status: DC | PRN

## 2014-04-04 MED ORDER — ASPIRIN EC 81 MG PO TBEC: 81.0000 mg | DELAYED_RELEASE_TABLET | Freq: Every day | ORAL | Status: DC

## 2014-04-04 MED ORDER — ASPIRIN 81 MG PO CHEW
81.0000 mg | CHEWABLE_TABLET | ORAL | Status: AC
  Administered 2014-04-05: 81 mg via ORAL
  Filled 2014-04-04: qty 1

## 2014-04-04 MED ORDER — SODIUM CHLORIDE 0.9 % IJ SOLN
3.0000 mL | Freq: Two times a day (BID) | INTRAMUSCULAR | Status: DC
  Administered 2014-04-04: 3 mL via INTRAVENOUS

## 2014-04-04 MED ORDER — HEPARIN (PORCINE) IN NACL 100-0.45 UNIT/ML-% IJ SOLN
950.0000 [IU]/h | INTRAMUSCULAR | Status: DC
  Administered 2014-04-04: 850 [IU]/h via INTRAVENOUS
  Filled 2014-04-04 (×2): qty 250

## 2014-04-04 MED ORDER — LISINOPRIL 5 MG PO TABS
5.0000 mg | ORAL_TABLET | Freq: Every day | ORAL | Status: DC
  Administered 2014-04-05 – 2014-04-06 (×2): 5 mg via ORAL
  Filled 2014-04-04 (×2): qty 1

## 2014-04-04 MED ORDER — SODIUM CHLORIDE 0.9 % IV SOLN: 250.0000 mL | INTRAVENOUS | Status: DC | PRN

## 2014-04-04 MED ORDER — CARVEDILOL 3.125 MG PO TABS
3.1250 mg | ORAL_TABLET | Freq: Two times a day (BID) | ORAL | Status: DC
  Administered 2014-04-04 – 2014-04-06 (×4): 3.125 mg via ORAL
  Filled 2014-04-04 (×6): qty 1

## 2014-04-04 MED ORDER — ACETAMINOPHEN 325 MG PO TABS: 650.0000 mg | ORAL_TABLET | ORAL | Status: DC | PRN

## 2014-04-04 MED ORDER — PANTOPRAZOLE SODIUM 40 MG PO TBEC: 40.0000 mg | DELAYED_RELEASE_TABLET | Freq: Every day | ORAL | Status: DC

## 2014-04-04 MED ORDER — ALPRAZOLAM 0.25 MG PO TABS: 0.2500 mg | ORAL_TABLET | Freq: Two times a day (BID) | ORAL | Status: DC | PRN

## 2014-04-04 MED ORDER — ASPIRIN 300 MG RE SUPP: 300.0000 mg | RECTAL | Status: DC

## 2014-04-04 MED ORDER — ASPIRIN 81 MG PO CHEW: 324.0000 mg | CHEWABLE_TABLET | ORAL | Status: DC

## 2014-04-04 MED ORDER — SODIUM CHLORIDE 0.9 % IV SOLN
1.0000 mL/kg/h | INTRAVENOUS | Status: DC
  Administered 2014-04-05: 1 mL/kg/h via INTRAVENOUS

## 2014-04-04 MED ORDER — CHLORHEXIDINE GLUCONATE CLOTH 2 % EX PADS
6.0000 | MEDICATED_PAD | Freq: Every day | CUTANEOUS | Status: DC
  Administered 2014-04-05: 6 via TOPICAL

## 2014-04-04 MED ORDER — OMEPRAZOLE 20 MG PO CPDR
20.0000 mg | DELAYED_RELEASE_CAPSULE | Freq: Every day | ORAL | Status: DC
  Administered 2014-04-05 – 2014-04-06 (×2): 20 mg via ORAL
  Filled 2014-04-04 (×3): qty 1

## 2014-04-04 MED ORDER — PRASUGREL HCL 10 MG PO TABS: 10.0000 mg | ORAL_TABLET | Freq: Every day | ORAL | Status: DC

## 2014-04-04 NOTE — Progress Notes (Signed)
ANTICOAGULATION CONSULT NOTE - Follow Up Consult  Pharmacy Consult for Heparin  Indication: chest pain/ACS  Allergies  Allergen Reactions  . Metoprolol Tartrate Hives and Itching    Patient Measurements: Height: 5\' 5"  (165.1 cm) Weight: 155 lb (70.308 kg) IBW/kg (Calculated) : 61.5  Vital Signs: Temp: 98 F (36.7 C) (12/10 2000) Temp Source: Oral (12/10 2000) BP: 106/63 mmHg (12/10 2000) Pulse Rate: 69 (12/10 2000)  Labs:  Recent Labs  04/04/14 1158 04/04/14 1539 04/04/14 2030 04/04/14 2244  HGB 14.0  --   --   --   HCT 40.0  --   --   --   PLT 231  --   --   --   APTT  --  27  --   --   LABPROT  --  13.6  --   --   INR  --  1.03  --   --   HEPARINUNFRC  --   --  0.40 0.31  CREATININE 0.63  --   --   --   TROPONINI  --  <0.30 <0.30  --     Estimated Creatinine Clearance: 82.2 mL/min (by C-G formula based on Cr of 0.63).   Assessment: HL at the low end of therapeutic range, other labs as above. No issues per RN.   Goal of Therapy:  Heparin level 0.3-0.7 units/ml Monitor platelets by anticoagulation protocol: Yes   Plan:  -Increase slightly to 950 units/hr to prevent sub-therapeutic level -HL with AM labs  -Daily CBC/HL -Monitor for bleeding  Narda Bonds 04/04/2014,11:28 PM

## 2014-04-04 NOTE — H&P (Addendum)
[name] is an 63 y.o. male.    Primary Cardiologist:Dr. Rockey Situ  PCP:  Viviana Simpler, MD  Chief Complaint: chest pain HPI: 63 year old gentleman with coronary artery disease, bypass surgery in 1995, stenting at [facility] in February 2004 for MI with a 3.0 x 25 mm and 4.5 x 16 mm Monorail ( location uncertain), also 4.5 x 18 mm stent placed to the mid RCA, followup with repeat stenting in April 2005 to the proximal LAD with a Taxus stent 2.5 x 8 mm, and stenting to the proximal left circumflex with a 2.5 x 12 mm Taxus, with recent stent placed November 13, 2010 with a 4.0 x 9 mm stent placed to the left main. History of renal artery stent. Abdominal aortic aneurysm 3.2 cm as well. Last cardiac cath 01/04/12 with severe triple vessel CAD s/p 3 V CABG with 1/3 patent bypass grafts. Patent stents in the proximal Circumflex and and proximal LAD. Low normal LV systolic function EF 23-53%.  Last nuc 2014, inf. Scar, no ischemia.   Now presents to [facility] ER for chest pain.  He was in his usual state of health today and after doing errands he developed significant chest pain and lt shoulder pain with radiation to the elbow.  He went home and took NTG with some improvement and 2nd NTG with more improvement.  Then pain returned.  He called the office and was instructed to call 911.  He was given 4 baby ASA and more NTG and finally IV morphine before he felt better.   Associated symptoms + lightheadedness, some nausea, no SOB or diaphoresis.  Currently mild discomfort lt ant chest but no pain.  Feels better.  This is like his previous angina.    EKG: SB 56 with mild ST depression in ant lat leads.  Troponin POC 0.03.    Past Medical History  Diagnosis Date  . CAD (coronary artery disease)     a. 1995 s/p CABG x 4: LIMA->LAD, VG->RI (known to be occluded), VG->AM->PDA;  b. 05/2002 Inf STEMI: VG->AM->PDA 100% treated w/ 2 BMS complicated by acute thrombosis req 3 BMS;  c. 07/2003 DES to   LAD & LCX (VG's to PDA & RI 100%);  d. 12/2010 Acute [place] ([place]): DES to LCX & LM , LIMA ok,;  e.12/2011 Cath: LM/LCX stents ok , LIMA patent.  Marland Kitchen GERD (gastroesophageal reflux disease)   . Hyperlipidemia   . Hypertension   . Diverticulitis     1/06 Diverticulitis--CT of pelvis--diffuse sigmoid divertic  . AAA (abdominal aortic aneurysm)     a. Duplex 01/2012: stable infrarenal saccular AAA at 3.3cm x 3.2xm (f/u recommended 01/2013 per Dr. Rockey Situ)  . Renal artery stenosis     a. 03/2011 PTA and stenting of L RA. b. 01/2012 renal duplex - stable 1-59% R renal artery stenosis, normal L renal artery s/p stent.  . Back pain   . Myocardial infarction   . Anginal pain   . Shortness of breath   . AAA (abdominal aortic aneurysm) without rupture 09/20/2013  . Diverticulitis   . CAD (coronary artery disease) of artery bypass graft 04/04/2014    Past Surgical History  Procedure Laterality Date  . Coronary artery bypass graft    . Closed reduction shoulder dislocation    . Angioplasty  1997  . Coronary stent placement  7/12    2 stents--Promus element plus (everolimus eluting)--[facility] in [place]  . Cardiac catheterization  8/09  Cath--vein graft occlusions which are old--no acute changes  . Cardiac catheterization  11/13/2010    stent x 2 @ [place]  . Renal artery stent  03/2011 ?  Marland Kitchen Renal angiogram N/A 04/16/2011    Procedure: RENAL ANGIOGRAM;  Surgeon: Burnell Blanks, MD;  Location: [facility] CATH LAB;  Service: Cardiovascular;  Laterality: N/A;  . Left heart catheterization with coronary/graft angiogram N/A 01/04/2012    Procedure: LEFT HEART CATHETERIZATION WITH Beatrix Fetters;  Surgeon: Burnell Blanks, MD;  Location: [facility] CATH LAB;  Service: Cardiovascular;  Laterality: N/A;    Family History  Problem Relation Age of Onset  . Coronary artery disease Mother     Died MI age 69  . Hypertension Mother   . Coronary artery disease Sister     Living  .  Coronary artery disease Brother     Living  . Diabetes Neg Hx   . Cancer Neg Hx     prostate or colon  . Coronary artery disease Father     Died MI age 47   Social History:  reports that he quit smoking about 20 years ago. His smoking use included Cigarettes. He has a 75 pack-year smoking history. He has never used smokeless tobacco. He reports that he drinks about 8.4 oz of alcohol per week. He reports that he does not use illicit drugs.  Married with 3 children and 6 grandkids, semi retired.  Allergies:  Allergies  Allergen Reactions  . Metoprolol Tartrate Hives and Itching    Outpatient Meds: No current facility-administered medications on file prior to encounter.   Current Outpatient Prescriptions on File Prior to Encounter  Medication Sig Dispense Refill  . aspirin 81 MG tablet Take 81 mg by mouth daily.    . carvedilol (COREG) 3.125 MG tablet TAKE 1 TABLET BY MOUTH TWICE A DAY 180 tablet 3  . isosorbide mononitrate (IMDUR) 30 MG 24 hr tablet Take 1 tablet (30 mg total) by mouth at bedtime. 30 tablet 6  . lisinopril (PRINIVIL,ZESTRIL) 5 MG tablet Take 1 tablet (5 mg total) by mouth daily. 30 tablet 6  . mirtazapine (REMERON) 15 MG tablet TAKE 1 TABLET BY MOUTH EVERY NIGHT AT BEDTIME AS NEEDED FOR SLEEP. 90 tablet 0  . naproxen sodium (ALEVE) 220 MG tablet Take 220 mg by mouth daily as needed (pain).    . nitroGLYCERIN (NITROSTAT) 0.4 MG SL tablet Place 0.4 mg under the tongue every 5 (five) minutes as needed for chest pain.    Marland Kitchen omeprazole (PRILOSEC OTC) 20 MG tablet Take 20 mg by mouth daily.    . prasugrel (EFFIENT) 10 MG TABS tablet Take 1 tablet (10 mg total) by mouth daily. 30 tablet 6  . rosuvastatin (CRESTOR) 40 MG tablet Take 1 tablet (40 mg total) by mouth at bedtime. 30 tablet 3  . zoster vaccine live, PF, (ZOSTAVAX) 22979 UNT/0.65ML injection Inject 19,400 Units into the skin once. 1 each 0     Results for orders placed or performed during the hospital encounter of  04/04/14 (from the past 48 hour(s))  Basic metabolic panel     Status: Abnormal   Collection Time: 04/04/14 11:58 AM  Result Value Ref Range   Sodium 135 (L) 137 - 147 mEq/L   Potassium 4.1 3.7 - 5.3 mEq/L   Chloride 99 96 - 112 mEq/L   CO2 22 19 - 32 mEq/L   Glucose, Bld 99 70 - 99 mg/dL   BUN 5 (L) 6 -  23 mg/dL   Creatinine, Ser 0.63 0.50 - 1.35 mg/dL   Calcium 9.3 8.4 - 10.5 mg/dL   GFR calc non Af Amer >90 >90 mL/min   GFR calc Af Amer >90 >90 mL/min    Comment: (NOTE) The eGFR has been calculated using the CKD EPI equation. This calculation has not been validated in all clinical situations. eGFR's persistently <90 mL/min signify possible Chronic Kidney Disease.    Anion gap 14 5 - 15  CBC     Status: Abnormal   Collection Time: 04/04/14 11:58 AM  Result Value Ref Range   WBC 6.0 4.0 - 10.5 K/uL   RBC 4.20 (L) 4.22 - 5.81 MIL/uL   Hemoglobin 14.0 13.0 - 17.0 g/dL   HCT 40.0 39.0 - 52.0 %   MCV 95.2 78.0 - 100.0 fL   MCH 33.3 26.0 - 34.0 pg   MCHC 35.0 30.0 - 36.0 g/dL   RDW 13.9 11.5 - 15.5 %   Platelets 231 150 - 400 K/uL  I-stat troponin, ED     Status: None   Collection Time: 04/04/14 12:15 PM  Result Value Ref Range   Troponin i, poc 0.03 0.00 - 0.08 ng/mL   Comment 3            Comment: Due to the release kinetics of cTnI, a negative result within the first hours of the onset of symptoms does not rule out myocardial infarction with certainty. If myocardial infarction is still suspected, repeat the test at appropriate intervals.    Dg Chest Port 1 View  04/04/2014   CLINICAL DATA:  Short of breath and chest pain  EXAM: PORTABLE CHEST - 1 VIEW  COMPARISON:  10/25/2012  FINDINGS: The heart size and mediastinal contours are within normal limits. Both lungs are clear. The visualized skeletal structures are unremarkable.  IMPRESSION: No active disease.   Electronically Signed   By: Franchot Gallo M.D.   On: 04/04/2014 13:03    ROS: General:no colds or fevers, no  weight changes Skin:no rashes or ulcers HEENT:no blurred vision, no congestion CV:see HPI PUL:see HPI GI:no diarrhea constipation or melena, no indigestion GU:no hematuria, no dysuria MS:no joint pain, no claudication, + back pain with hx of surgery, + arthritis in knees Neuro:no syncope, + lightheadedness today Endo:no diabetes, no thyroid disease   Blood pressure 140/82, pulse 50, temperature 97.5 F (36.4 C), resp. rate 10, SpO2 96 %. PE: General:Pleasant affect, NAD Skin:Warm and dry, brisk capillary refill HEENT:normocephalic, sclera clear, mucus membranes moist Neck:supple, no JVD, no bruits, no thyromegaly, no adenopathy  Heart:S1S2 RRR without murmur, gallup, rub or click Lungs:clear without rales, rhonchi, or wheezes MKL:KJZP, non tender, + BS, do not palpate liver spleen or masses Ext:no lower ext edema, 2+ pedal pulses, 2+ radial pulses Neuro:alert and oriented X 3, MAE, follows commands, + facial symmetry    Assessment/Plan Principal Problem:   Unstable angina- severe pain today 8/10 like his angina.  POC troponin neg.  Will admit, add IV NTG for mild continued discomfort, IV heparin.  Serial enzymes, possible cath in AM. Dr. Burt Knack to see.  Active Problems:   CAD (coronary artery disease) of artery bypass graft 2 grafts down, and patent LIMA at last cath 2013.     Hyperlipidemia LDL goal <70- in 11/2013 LDL 74.6, HDL 38, TG 373 (direct LDL 114)   Essential hypertension- stable    Coronary atherosclerosis    Renal artery stenosis- last doppler 11/2014mild renal artery stenosis on the right,  Stent open on the left   and scheduled for next week.     AAA (abdominal aortic aneurysm) without rupture-AAA is still 3.2 cm    [facility] Nurse Practitioner Certified [place] Pager [phone] or after 5pm or weekends call [phone] 04/04/2014, 3:18 PM    Personally seen and examined. Agree with above.  63 year old post CABG with  subsequent PCI now with unstable angina, ST depression in anterolateral leads.  -Heart cath tomorrow -IV hep, NTG -Continue Effient and ASA, high dose statin, beta blocker (no room to increase with bradycardia). -Unremarkable exam.   -Has renal artery stenosis, small AAA 3.2.  -Concerned over cost of meds (Effient-on because of Prilosec,  and Crestor) -Dr. Donivan Scull office has been gracious with samples.   Candee Furbish, MD

## 2014-04-04 NOTE — ED Notes (Signed)
Substernal cp while at rest; constant dull pressure; radiating to left arm - took 3 ntg. Sl. 0.4 mg. Morphine 6 mg ivp. 104/73, 60's, sa02's 98%. nsr occ. Pvcs.

## 2014-04-04 NOTE — ED Notes (Signed)
Cardiology at bedside.

## 2014-04-04 NOTE — Telephone Encounter (Signed)
Pt wife called, states pt is experiencing cp, wants to know how many Nitroglycerin he should take. Please advise.

## 2014-04-04 NOTE — Progress Notes (Signed)
ANTICOAGULATION CONSULT NOTE - Initial Consult  Pharmacy Consult for heparin Indication: chest pain/ACS  Allergies  Allergen Reactions  . Metoprolol Tartrate Hives and Itching    Patient Measurements: Weight: 155 lb (70.308 kg) Heparin Dosing Weight: 70 kg  Vital Signs: Temp: 97.5 F (36.4 C) (12/10 1119) BP: 140/82 mmHg (12/10 1500) Pulse Rate: 50 (12/10 1500)  Labs:  Recent Labs  04/04/14 1158  HGB 14.0  HCT 40.0  PLT 231  CREATININE 0.63    Estimated Creatinine Clearance: 83.8 mL/min (by C-G formula based on Cr of 0.63).   Medical History: Past Medical History  Diagnosis Date  . CAD (coronary artery disease)     a. 1995 s/Klein CABG x 4: LIMA->LAD, VG->RI (known to be occluded), VG->AM->PDA;  b. 05/2002 Inf STEMI: VG->AM->PDA 100% treated w/ 2 BMS complicated by acute thrombosis req 3 BMS;  c. 07/2003 DES to  LAD & LCX (VG's to PDA & RI 100%);  d. 12/2010 Acute [place] ([place]): DES to LCX & LM , LIMA ok,;  e.12/2011 Cath: LM/LCX stents ok , LIMA patent.  Marland Kitchen GERD (gastroesophageal reflux disease)   . Hyperlipidemia   . Hypertension   . Diverticulitis     1/06 Diverticulitis--CT of pelvis--diffuse sigmoid divertic  . AAA (abdominal aortic aneurysm)     a. Duplex 01/2012: stable infrarenal saccular AAA at 3.3cm x 3.2xm (f/u recommended 01/2013 per Dr. Rockey Situ)  . Renal artery stenosis     a. 03/2011 PTA and stenting of L RA. b. 01/2012 renal duplex - stable 1-59% R renal artery stenosis, normal L renal artery s/Klein stent.  . Back pain   . Myocardial infarction   . Anginal pain   . Shortness of breath   . AAA (abdominal aortic aneurysm) without rupture 09/20/2013  . Diverticulitis   . CAD (coronary artery disease) of artery bypass graft 04/04/2014    Medications:  See med rec  Assessment: Patient is a 63 y.o M with hx CAD presented to the ED with CP.  To start heparin for ACS with plan for possible cath in AM.   Goal of Therapy:  Heparin level 0.3-0.7 units/ml Monitor  platelets by anticoagulation protocol: Yes   Plan:  1) heparin 3000 units IV bolus, then drip at 850 units/hr 2) check 6 hour heparin level  [name] 04/04/2014,4:00 PM

## 2014-04-04 NOTE — ED Provider Notes (Signed)
CSN: [id number]     Arrival date & time 04/04/14  1109 History   First MD Initiated Contact with Patient 04/04/14 1113     Chief Complaint  Patient presents with  . Chest Pain     (Consider location/radiation/quality/duration/timing/severity/associated sxs/prior Treatment) HPI Comments: Patient presents with chest pain. He has an extensive cardiac history with bypass surgery in the 1990s and subsequent several stent placement. The last stent was placed  about 3 years ago. He's followed by one of our cardiology in [place]. He comes in with chest pain. He states he had a pressure-like aching feeling in left side of his chest in the center of his chest that started about 8:30 this morning while he was driving. It radiates to his left arm down to the elbow. It was associated with dizziness and shortness of breath. He took a total of 3-4 of his nitroglycerin was given 3 nitroglycerin by EMS as well. He's pain-free in the ED. He denies any vomiting although he's had some nausea. There is no diaphoresis. He was also given 6 mg of morphine by EMS. He is on Effient.  Patient is a 63 y.o. male presenting with chest pain.  Chest Pain Associated symptoms: nausea and shortness of breath   Associated symptoms: no abdominal pain, no back pain, no cough, no diaphoresis, no dizziness, no fatigue, no fever, no headache, no numbness, not vomiting and no weakness     Past Medical History  Diagnosis Date  . CAD (coronary artery disease)     a. 1995 s/p CABG x 4: LIMA->LAD, VG->RI (known to be occluded), VG->AM->PDA;  b. 05/2002 Inf STEMI: VG->AM->PDA 100% treated w/ 2 BMS complicated by acute thrombosis req 3 BMS;  c. 07/2003 DES to  LAD & LCX (VG's to PDA & RI 100%);  d. 12/2010 Acute [place] ([place]): DES to LCX & LM , LIMA ok,;  e.12/2011 Cath: LM/LCX stents ok , LIMA patent.  Marland Kitchen GERD (gastroesophageal reflux disease)   . Hyperlipidemia   . Hypertension   . Diverticulitis     1/06 Diverticulitis--CT of pelvis--diffuse  sigmoid divertic  . AAA (abdominal aortic aneurysm)     a. Duplex 01/2012: stable infrarenal saccular AAA at 3.3cm x 3.2xm (f/u recommended 01/2013 per Dr. Rockey Situ)  . Renal artery stenosis     a. 03/2011 PTA and stenting of L RA. b. 01/2012 renal duplex - stable 1-59% R renal artery stenosis, normal L renal artery s/p stent.  . Back pain   . Myocardial infarction   . Anginal pain   . Shortness of breath   . AAA (abdominal aortic aneurysm) without rupture 09/20/2013  . Diverticulitis   . CAD (coronary artery disease) of artery bypass graft 04/04/2014   Past Surgical History  Procedure Laterality Date  . Coronary artery bypass graft    . Closed reduction shoulder dislocation    . Angioplasty  1997  . Coronary stent placement  7/12    2 stents--Promus element plus (everolimus eluting)--[facility] in [place]  . Cardiac catheterization      8/09  Cath--vein graft occlusions which are old--no acute changes  . Cardiac catheterization  11/13/2010    stent x 2 @ [place]  . Renal artery stent  03/2011 ?  Marland Kitchen Renal angiogram N/A 04/16/2011    Procedure: RENAL ANGIOGRAM;  Surgeon: Burnell Blanks, MD;  Location: [facility] CATH LAB;  Service: Cardiovascular;  Laterality: N/A;  . Left heart catheterization with coronary/graft angiogram N/A 01/04/2012  Procedure: LEFT HEART CATHETERIZATION WITH Beatrix Fetters;  Surgeon: Burnell Blanks, MD;  Location: [facility] CATH LAB;  Service: Cardiovascular;  Laterality: N/A;   Family History  Problem Relation Age of Onset  . Coronary artery disease Mother     Died MI age 57  . Hypertension Mother   . Heart attack Mother   . Coronary artery disease Sister     Living  . Coronary artery disease Brother     Living  . Diabetes Neg Hx   . Cancer Neg Hx     prostate or colon  . Coronary artery disease Father     Died MI age 7  . Heart attack Father   . Heart attack Maternal Grandfather    History  Substance Use Topics  . Smoking  status: Former Smoker -- 3.00 packs/day for 25 years    Types: Cigarettes    Quit date: 05/28/1993  . Smokeless tobacco: Never Used  . Alcohol Use: 8.4 oz/week    14 Cans of beer per week    Review of Systems  Constitutional: Negative for fever, chills, diaphoresis and fatigue.  HENT: Negative for congestion, rhinorrhea and sneezing.   Eyes: Negative.   Respiratory: Positive for shortness of breath. Negative for cough and chest tightness.   Cardiovascular: Positive for chest pain. Negative for leg swelling.  Gastrointestinal: Positive for nausea. Negative for vomiting, abdominal pain, diarrhea and blood in stool.  Genitourinary: Negative for frequency, hematuria, flank pain and difficulty urinating.  Musculoskeletal: Negative for back pain and arthralgias.  Skin: Negative for rash.  Neurological: Positive for light-headedness. Negative for dizziness, speech difficulty, weakness, numbness and headaches.      Allergies  Metoprolol tartrate  Home Medications   Prior to Admission medications   Medication Sig Start Date End Date Taking? Authorizing Provider  aspirin 81 MG tablet Take 81 mg by mouth daily.   Yes Historical Provider, MD  carvedilol (COREG) 3.125 MG tablet TAKE 1 TABLET BY MOUTH TWICE A DAY 02/26/14  Yes Venia Carbon, MD  isosorbide mononitrate (IMDUR) 30 MG 24 hr tablet Take 1 tablet (30 mg total) by mouth at bedtime. 10/09/13  Yes Minna Merritts, MD  lisinopril (PRINIVIL,ZESTRIL) 5 MG tablet Take 1 tablet (5 mg total) by mouth daily. 10/09/13  Yes Minna Merritts, MD  mirtazapine (REMERON) 15 MG tablet TAKE 1 TABLET BY MOUTH EVERY NIGHT AT BEDTIME AS NEEDED FOR SLEEP. 03/14/14  Yes Venia Carbon, MD  naproxen sodium (ALEVE) 220 MG tablet Take 220 mg by mouth daily as needed (pain).   Yes Historical Provider, MD  nitroGLYCERIN (NITROSTAT) 0.4 MG SL tablet Place 0.4 mg under the tongue every 5 (five) minutes as needed for chest pain.   Yes Historical Provider, MD   omeprazole (PRILOSEC OTC) 20 MG tablet Take 20 mg by mouth daily.   Yes Historical Provider, MD  prasugrel (EFFIENT) 10 MG TABS tablet Take 1 tablet (10 mg total) by mouth daily. 10/10/13  Yes Minna Merritts, MD  rosuvastatin (CRESTOR) 40 MG tablet Take 1 tablet (40 mg total) by mouth at bedtime. 09/03/13  Yes Minna Merritts, MD  zoster vaccine live, PF, (ZOSTAVAX) 99371 UNT/0.65ML injection Inject 19,400 Units into the skin once. 12/19/13  Yes Venia Carbon, MD   BP 140/82 mmHg  Pulse 50  Temp(Src) 97.5 F (36.4 C)  Resp 10  Wt 155 lb (70.308 kg)  SpO2 96% Physical Exam  Constitutional: He is oriented to person, place, and  time. He appears well-developed and well-nourished.  HENT:  Head: Normocephalic and atraumatic.  Eyes: Pupils are equal, round, and reactive to light.  Neck: Normal range of motion. Neck supple.  Cardiovascular: Normal rate, regular rhythm and normal heart sounds.   Pulmonary/Chest: Effort normal and breath sounds normal. No respiratory distress. He has no wheezes. He has no rales. He exhibits no tenderness.  Abdominal: Soft. Bowel sounds are normal. There is no tenderness. There is no rebound and no guarding.  Musculoskeletal: Normal range of motion. He exhibits no edema.  No calf tenderness  Lymphadenopathy:    He has no cervical adenopathy.  Neurological: He is alert and oriented to person, place, and time.  Skin: Skin is warm and dry. No rash noted.  Psychiatric: He has a normal mood and affect.    ED Course  Procedures (including critical care time) Labs Review Labs Reviewed  BASIC METABOLIC PANEL - Abnormal; Notable for the following:    Sodium 135 (*)    BUN 5 (*)    All other components within normal limits  CBC - Abnormal; Notable for the following:    RBC 4.20 (*)    All other components within normal limits  TSH  MAGNESIUM  T4, FREE  TROPONIN I  TROPONIN I  TROPONIN I  HEMOGLOBIN A1C  PROTIME-INR  APTT  HEPATIC FUNCTION PANEL   Randolm Idol, ED    Imaging Review Dg Chest Port 1 View  04/04/2014   CLINICAL DATA:  Short of breath and chest pain  EXAM: PORTABLE CHEST - 1 VIEW  COMPARISON:  10/25/2012  FINDINGS: The heart size and mediastinal contours are within normal limits. Both lungs are clear. The visualized skeletal structures are unremarkable.  IMPRESSION: No active disease.   Electronically Signed   By: Franchot Gallo M.D.   On: 04/04/2014 13:03     EKG Interpretation   Date/Time:  Thursday April 04 2014 13:22:51 EST Ventricular Rate:  56 PR Interval:  144 QRS Duration: 101 QT Interval:  474 QTC Calculation: 457 R Axis:   61 Text Interpretation:  Sinus rhythm Borderline repol abnormality, diffuse  leads since last tracing no significant change Confirmed by Aidon Klemens  MD,  Christorpher Hisaw ([id number]) on 04/04/2014 1:27:35 PM      MDM   Final diagnoses:  Other chest pain    Pt is currently pain free.  Maybe some slightly increased ST depression laterally.  Troponin negative.  Will consult cardiology.    Malvin Johns, MD 04/04/14 [phone]

## 2014-04-04 NOTE — Telephone Encounter (Signed)
Spoke w/ pt's wife.  She reports that pt left to go to the store this am, but came back b/c he didn't feel good.  She reports that pt had severe pain in his chest radiating to his shoulder, so he took a nitro.  Reports that pain subsided, but returned so he took another.  She is asking how many nitro to give before he needs medical attention. Advised her to have pt take another nitro, but to go ahead and call 911 so he can be evaluated and treatment can be started. She verbalizes understanding and states that she will hang up and call 911.

## 2014-04-05 ENCOUNTER — Encounter (HOSPITAL_COMMUNITY)
Admission: EM | Disposition: A | Discharge status: Home / Self Care | Payer: Self-pay | Source: Home / Self Care | Attending: Cardiovascular Disease

## 2014-04-05 ENCOUNTER — Encounter (HOSPITAL_COMMUNITY): Payer: Self-pay | Admitting: Physician Assistant

## 2014-04-05 DIAGNOSIS — I2582 Chronic total occlusion of coronary artery: Secondary | ICD-10-CM

## 2014-04-05 DIAGNOSIS — I25110 Atherosclerotic heart disease of native coronary artery with unstable angina pectoris: Principal | ICD-10-CM

## 2014-04-05 HISTORY — PX: CARDIAC CATHETERIZATION: SHX172

## 2014-04-05 HISTORY — PX: LEFT HEART CATHETERIZATION WITH CORONARY/GRAFT ANGIOGRAM: SHX5450

## 2014-04-05 HISTORY — PX: CORONARY ANGIOPLASTY: SHX604

## 2014-04-05 HISTORY — PX: PERCUTANEOUS CORONARY STENT INTERVENTION (PCI-S): SHX5485

## 2014-04-05 LAB — CBC
HEMATOCRIT: 36.6 % — AB (ref 39.0–52.0)
Hemoglobin: 12.6 g/dL — ABNORMAL LOW (ref 13.0–17.0)
MCH: 32.3 pg (ref 26.0–34.0)
MCHC: 34.4 g/dL (ref 30.0–36.0)
MCV: 93.8 fL (ref 78.0–100.0)
Platelets: 228 10*3/uL (ref 150–400)
RBC: 3.9 MIL/uL — AB (ref 4.22–5.81)
RDW: 14.2 % (ref 11.5–15.5)
WBC: 5.6 10*3/uL (ref 4.0–10.5)

## 2014-04-05 LAB — LIPID PANEL
CHOL/HDL RATIO: 4.8 ratio
Cholesterol: 182 mg/dL (ref 0–200)
HDL: 38 mg/dL — AB (ref >39)
LDL Cholesterol: 105 mg/dL — ABNORMAL HIGH (ref 0–99)
TRIGLYCERIDES: 194 mg/dL — AB (ref <150)
VLDL: 39 mg/dL (ref 0–40)

## 2014-04-05 LAB — BASIC METABOLIC PANEL
ANION GAP: 15 (ref 5–15)
BUN: 6 mg/dL (ref 6–23)
CALCIUM: 9.1 mg/dL (ref 8.4–10.5)
CHLORIDE: 100 meq/L (ref 96–112)
CO2: 22 meq/L (ref 19–32)
CREATININE: 0.67 mg/dL (ref 0.50–1.35)
GFR calc Af Amer: >90 mL/min (ref >90)
GFR calc non Af Amer: >90 mL/min (ref >90)
Glucose, Bld: 87 mg/dL (ref 70–99)
Potassium: 3.7 mEq/L (ref 3.7–5.3)
Sodium: 137 mEq/L (ref 137–147)

## 2014-04-05 LAB — POCT ACTIVATED CLOTTING TIME
Activated Clotting Time: 282 seconds
Activated Clotting Time: 294 seconds

## 2014-04-05 LAB — TROPONIN I: Troponin I: <0.3 ng/mL (ref <0.30)

## 2014-04-05 LAB — HEPARIN LEVEL (UNFRACTIONATED): Heparin Unfractionated: 0.33 IU/mL (ref 0.30–0.70)

## 2014-04-05 SURGERY — LEFT HEART CATHETERIZATION WITH CORONARY/GRAFT ANGIOGRAM
Anesthesia: LOCAL

## 2014-04-05 MED ORDER — PRASUGREL HCL 10 MG PO TABS
ORAL_TABLET | ORAL | Status: AC
  Filled 2014-04-05: qty 1

## 2014-04-05 MED ORDER — ACETAMINOPHEN 325 MG PO TABS: 650.0000 mg | ORAL_TABLET | ORAL | Status: DC | PRN

## 2014-04-05 MED ORDER — MIDAZOLAM HCL 2 MG/2ML IJ SOLN
INTRAMUSCULAR | Status: AC
  Filled 2014-04-05: qty 2

## 2014-04-05 MED ORDER — HEPARIN SODIUM (PORCINE) 1000 UNIT/ML IJ SOLN
INTRAMUSCULAR | Status: AC
  Filled 2014-04-05: qty 1

## 2014-04-05 MED ORDER — FENTANYL CITRATE 0.05 MG/ML IJ SOLN
INTRAMUSCULAR | Status: AC
  Filled 2014-04-05: qty 2

## 2014-04-05 MED ORDER — LIDOCAINE HCL (PF) 1 % IJ SOLN
INTRAMUSCULAR | Status: AC
  Filled 2014-04-05: qty 30

## 2014-04-05 MED ORDER — NITROGLYCERIN 1 MG/10 ML FOR IR/CATH LAB
INTRA_ARTERIAL | Status: AC
  Filled 2014-04-05: qty 10

## 2014-04-05 MED ORDER — ASPIRIN 81 MG PO CHEW
81.0000 mg | CHEWABLE_TABLET | Freq: Every day | ORAL | Status: DC
  Administered 2014-04-06: 09:00:00 81 mg via ORAL
  Filled 2014-04-05: qty 1

## 2014-04-05 MED ORDER — HEPARIN (PORCINE) IN NACL 2-0.9 UNIT/ML-% IJ SOLN
INTRAMUSCULAR | Status: AC
  Filled 2014-04-05: qty 1500

## 2014-04-05 MED ORDER — LIVING BETTER WITH HEART FAILURE BOOK
Freq: Once | Status: AC
  Administered 2014-04-05: 16:00:00

## 2014-04-05 MED ORDER — SODIUM CHLORIDE 0.9 % IV SOLN
1.0000 mL/kg/h | INTRAVENOUS | Status: AC
  Administered 2014-04-05: 1 mL/kg/h via INTRAVENOUS

## 2014-04-05 MED ORDER — ONDANSETRON HCL 4 MG/2ML IJ SOLN: 4.0000 mg | Freq: Four times a day (QID) | INTRAMUSCULAR | Status: DC | PRN

## 2014-04-05 MED ORDER — PRASUGREL HCL 10 MG PO TABS
10.0000 mg | ORAL_TABLET | Freq: Every day | ORAL | Status: DC
  Administered 2014-04-06: 09:00:00 10 mg via ORAL
  Filled 2014-04-05: qty 1

## 2014-04-05 NOTE — Progress Notes (Signed)
TR BAND REMOVAL  LOCATION:    left radial  DEFLATED PER PROTOCOL:    Yes.    TIME BAND OFF / DRESSING APPLIED:    1432     SITE UPON ARRIVAL:    Level 0  SITE AFTER BAND REMOVAL:    Level 1  REVERSE ALLEN'S TEST:     positive  CIRCULATION SENSATION AND MOVEMENT:    Within Normal Limits   Yes.    COMMENTS:   Post radial cath instructions reviewed w/ pt.  "Living better with heart failure" packet given to pt due to EF 35%.  Discussed s/s worsening heart failure, when to call MD, low sodium heart healthy diet, medication compliance, daily weights.  Pt voices understanding.

## 2014-04-05 NOTE — CV Procedure (Signed)
PROCEDURE:  Left heart catheterization with selective coronary angiography, left ventriculogram.  Bypass angiography, PCI of the CTO of the OM1  INDICATIONS:  Unstable angina  The risks, benefits, and details of the procedure were explained to the patient.  The patient verbalized understanding and wanted to proceed.  Informed written consent was obtained.  PROCEDURE TECHNIQUE:  After Xylocaine anesthesia a 59F slender sheath was placed in the right radial artery with a single anterior needle wall stick.   IV Heparin was given.  Right coronary angiography was done using a Judkins R4 guide catheter.  Left coronary angiography was done using a Judkins L3.5 guide catheter.  Left ventriculography was done using a pigtail catheter.  A TR band was used for hemostasis.   CONTRAST:  Total of 155 cc.  COMPLICATIONS:  None.    HEMODYNAMICS:  Aortic pressure was 140/70; LV pressure was 140/4; LVEDP 19.  There was no gradient between the left ventricle and aorta.    ANGIOGRAPHIC DATA:   The left main coronary artery has a patent stent.  The left anterior descending artery is moderately diseased proximally. The mid vessel is occluded. There are large septal perforators which provide collaterals to the distal RCA system. The LIMA to LAD is widely patent  The left circumflex artery is a large vessel proximally. The proximal stents are patent. The OM1 is occluded. The proximal portion of the OM1 contains an old bare-metal stent.  The remainder of the circumflex is moderate-sized. There are collaterals to the RCA system.  The SVG to circumflex is occluded by prior cath.  The right coronary artery is  occluded proximally. The SVG to RCA is occluded by prior cath.  LEFT VENTRICULOGRAM:  Left ventricular angiogram was done in the 30 RAO projection and revealed moderately decreased systolic function with an estimated ejection fraction of 35%.  There is a basal inferior aneurysm with akinesis in that  territory. LVEDP was 19 mmHg.  PCI NARRATIVE: And EBU 3 guide catheter was used to engage the left main. This did not fit as well as would've liked but it was adequate. A fielder XT wire was advanced across the occlusion in the OM1 stent.  We tried to get a 1.5 balloon to the lesion. Eventually, we had success. Several balloon inflations were performed. There was improved flow into the distal vessel. The over-the-wire balloon was taken out. A pro-water wire was placed into the true circumflex. A 2.0 balloon was advanced. Multiple balloon inflations were performed. This revealed 2 areas of significant disease, the first within the stent and the second into no above vessel distal to the stent. Multiple balloon inflations were performed. We attempted to get a 2.25 x 28 alpine drug-eluting stent to the lesion. It would not cross the proximal stented portion.  A 2.5 x 12 balloon was used to further predilate. Subsequent, a 2.25 x 12 and then 2.25 x 8 Promus drug-eluting stents were deployed in overlapping fashion in the OM1. The stents were postdilated with the stent balloon up to 2.5 mm in diameter. We did not cover the ostium of the remainder of the circumflex since this vessel provides collaterals. There is already one layer stent there. We did take the stent balloon and ballooned the ostium of the OM1. There was an excellent angiographic result. TIMI-3 flow was restored. Several doses of intracoronary nitroglycerin were administered.   IMPRESSIONS:  1.  Patent stents in the left main coronary artery. 2.  Occluded mid  left anterior descending artery. Patent LIMA to LAD. 3. Patent left circumflex artery . Prior bare-metal stent that extends into the OM1 was occluded in the distal portion, just after the ostium of OM1. This area was successfully treated with overlapping Promus drug-eluting stents, 2.25 x 12 and 2.25 x 8, postdilated to 2.5 mm in diameter with the stent balloon.. 4.  Occluded right coronary  artery. 5.  Moderately decreased left ventricular systolic function.  LVEDP 19 mmHg.  Ejection fraction 35%.  RECOMMENDATION:   Continue dual antiplatelet therapy indefinitely. He'll need aggressive secondary prevention and therapy for heart failure. His EF appears to have decreased since the last catheterization. This may have been due to the occlusion of the OM1. Hopefully, EF well improved. He'll be watched overnight.

## 2014-04-05 NOTE — Care Management Note (Signed)
    Page 1 of 1   04/05/2014     4:29:00 PM CARE MANAGEMENT NOTE 04/05/2014  Patient:  [name], [name]   Account Number:  [id number]  Date Initiated:  04/05/2014  Documentation initiated by:  Domonick Sittner  Subjective/Objective Assessment:   Pt s/p cath on 04/05/14.     Action/Plan:   Pt on Effient, states he has been paying full price as has new insurance and has to meet deductible.  Will check coverage; pt given free 30 day trial card and copay card.   Anticipated DC Date:  04/06/2014   Anticipated DC Plan:  [place]  CM consult  Medication Assistance      Choice offered to / List presented to:             Status of service:  Completed, signed off Medicare Important Message given?   (If response is "NO", the following Medicare IM given date fields will be blank) Date Medicare IM given:   Medicare IM given by:   Date Additional Medicare IM given:   Additional Medicare IM given by:    Discharge Disposition:  HOME/SELF CARE  Per UR Regulation:  Reviewed for med. necessity/level of care/duration of stay  If discussed at [place] of Stay Meetings, dates discussed:    Comments:  04/05/14 Ellan Lambert, RN, BSN  DRUG IS A NONPREFERRED DRUG PT COPAY WILL BE $70 AFTER HE PAYS $131.31 DEDUCTIBLE

## 2014-04-05 NOTE — Interval H&P Note (Signed)
Cath Lab Visit (complete for each Cath Lab visit)  Clinical Evaluation Leading to the Procedure:   ACS: Yes.    Non-ACS:    Anginal Classification: CCS IV  Anti-ischemic medical therapy: Minimal Therapy (1 class of medications)  Non-Invasive Test Results: No non-invasive testing performed  Prior CABG: Previous CABG  TIMI SCORE  Patient Information:  TIMI Score is 3  UA/NSTEMI and intermediate-risk features (e.g., TIMI score 3?4) for short-term risk of death or nonfatal MI  Revascularization of the presumed culprit artery   A (9)  Indication: 10; Score: 9     History and Physical Interval Note:  04/05/2014 8:46 AM  [name]  has presented today for surgery, with the diagnosis of cp  The various methods of treatment have been discussed with the patient and family. After consideration of risks, benefits and other options for treatment, the patient has consented to  Procedure(s): LEFT HEART CATHETERIZATION WITH CORONARY/GRAFT ANGIOGRAM (N/A) as a surgical intervention .  The patient's history has been reviewed, patient examined, no change in status, stable for surgery.  I have reviewed the patient's chart and labs.  Questions were answered to the patient's satisfaction.     Rilyn Upshaw S.

## 2014-04-05 NOTE — Progress Notes (Signed)
Pt dsg to radial site had some oozing blood in during change of shift; new dsg applied to site. Will continue to monitor. Pt wrist elevated on pillow and pt educated on hand elevation and no movement with wrist. Pt voices understanding and will continue to monitor quietly. Francis Gaines Anari Evitt RN.

## 2014-04-05 NOTE — Progress Notes (Signed)
UR Completed.  [phone]  

## 2014-04-06 LAB — BASIC METABOLIC PANEL
Anion gap: 12 (ref 5–15)
BUN: 4 mg/dL — AB (ref 6–23)
CHLORIDE: 107 meq/L (ref 96–112)
CO2: 21 mEq/L (ref 19–32)
Calcium: 9 mg/dL (ref 8.4–10.5)
Creatinine, Ser: 0.71 mg/dL (ref 0.50–1.35)
GFR calc Af Amer: >90 mL/min (ref >90)
GFR calc non Af Amer: >90 mL/min (ref >90)
Glucose, Bld: 97 mg/dL (ref 70–99)
Potassium: 3.8 mEq/L (ref 3.7–5.3)
Sodium: 140 mEq/L (ref 137–147)

## 2014-04-06 LAB — CBC
HCT: 35.8 % — ABNORMAL LOW (ref 39.0–52.0)
HEMOGLOBIN: 12.2 g/dL — AB (ref 13.0–17.0)
MCH: 32.3 pg (ref 26.0–34.0)
MCHC: 34.1 g/dL (ref 30.0–36.0)
MCV: 94.7 fL (ref 78.0–100.0)
Platelets: 215 10*3/uL (ref 150–400)
RBC: 3.78 MIL/uL — ABNORMAL LOW (ref 4.22–5.81)
RDW: 14.3 % (ref 11.5–15.5)
WBC: 6.1 10*3/uL (ref 4.0–10.5)

## 2014-04-06 MED ORDER — PRASUGREL HCL 10 MG PO TABS: 10.0000 mg | ORAL_TABLET | Freq: Every day | ORAL | Status: DC

## 2014-04-06 NOTE — Discharge Summary (Signed)
CARDIOLOGY DISCHARGE SUMMARY   Patient ID: [name] MRN: [id number] DOB/AGE: March 07, 1951 63 y.o.  Admit date: 04/04/2014 Discharge date: 04/06/2014  PCP: Viviana Simpler, MD Primary Cardiologist: Dr. Rockey Situ  Primary Discharge Diagnosis: Unstable angina  Secondary Discharge Diagnosis:    Hyperlipidemia LDL goal <70   Essential hypertension   Coronary atherosclerosis   Renal artery stenosis   AAA (abdominal aortic aneurysm) without rupture   CAD (coronary artery disease) of artery bypass graft  Procedures: Left heart catheterization with selective coronary angiography, left ventriculogram. Bypass angiography, PCI of the CTO of the [place] [facility] Course: [name] is a 63 y.o. male with a history of CAD. He was admitted 12/10 for chest pain, his usual angina.  Cardiac enzymes were negative for MI. He was taken to the cath lab on 12/11, results below. He had PCI of a CTO OM1 and tolerated the procedure well.   On 12/12, he was seen by Dr. Harl Bowie and all data were reviewed. He has dyslipidemia, lipid profile below. He is on Crestor 40 mg and compliance is encouraged. The medication is expensive for him, but his insurance will be changing in the next month or 2 and he will discuss options with Dr. Rockey Situ 1 mL how much the medication will cost him going forward. He is compliant with his Effient but would prefer to switch to Plavix for cost savings. However, Prilosec controls his reflux whereas Protonix did not. He has not tried Nexium. He will discuss this with Dr. Rockey Situ as well.   He has sinus bradycardia, high 40s at times but is asymptomatic. He is on Coreg 3.125 mg bid and will continue this. His volume status is good and he is encouraged to weigh himself every day. No further inpatient workup is indicated and he is considered stable for discharge, to follow up in [place].  BP 137/73 mmHg  Pulse 73  Temp(Src) 98.2 F (36.8 C) (Oral)  Resp 18  Ht 5\' 5"  (1.651 m)   Wt 153 lb 14.1 oz (69.8 kg)  BMI 25.61 kg/m2  SpO2 98% General: Well developed, well nourished, male in no acute distress Head: Eyes PERRLA, No xanthomas.   Normocephalic and atraumatic  Lungs: Clear bilaterally to auscultation. Heart: HRRR S1 S2, without MRG.  Pulses are 2+ & equal.   No JVD.  Abdomen: Bowel sounds are present, abdomen soft and non-tender without masses or  hernias noted. Msk: Normal strength and tone for age. Extremities: No clubbing, cyanosis or edema.  Left radial cath site without ecchymosis or hematoma Skin:  No rashes or lesions noted. Neuro: Alert and oriented X 3. Psych:  Good affect, responds appropriately   Labs:   Lab Results  Component Value Date   WBC 6.1 04/06/2014   HGB 12.2* 04/06/2014   HCT 35.8* 04/06/2014   MCV 94.7 04/06/2014   PLT 215 04/06/2014    Recent Labs Lab 04/04/14 1539  04/06/14 0327  NA  --   < > 140  K  --   < > 3.8  CL  --   < > 107  CO2  --   < > 21  BUN  --   < > 4*  CREATININE  --   < > 0.71  CALCIUM  --   < > 9.0  PROT 7.1  --   --   BILITOT 0.5  --   --   ALKPHOS 54  --   --   ALT 18  --   --  AST 23  --   --   GLUCOSE  --   < > 97  < > = values in this interval not displayed.  Recent Labs  04/04/14 1539 04/04/14 2030 04/05/14 0337  TROPONINI <0.30 <0.30 <0.30   Lipid Panel     Component Value Date/Time   CHOL 182 04/05/2014 0337   TRIG 194* 04/05/2014 0337   HDL 38* 04/05/2014 0337   CHOLHDL 4.8 04/05/2014 0337   VLDL 39 04/05/2014 0337   LDLCALC 105* 04/05/2014 0337   LDLDIRECT 114.4 12/19/2013 0931    Recent Labs  04/04/14 1539  INR 1.03      Radiology: Dg Chest Port 1 View 04/04/2014   CLINICAL DATA:  Short of breath and chest pain  EXAM: PORTABLE CHEST - 1 VIEW  COMPARISON:  10/25/2012  FINDINGS: The heart size and mediastinal contours are within normal limits. Both lungs are clear. The visualized skeletal structures are unremarkable.  IMPRESSION: No active disease.   Electronically  Signed   By: Franchot Gallo M.D.   On: 04/04/2014 13:03    Cardiac Cath: 04/06/2014 ANGIOGRAPHIC DATA: The left main coronary artery has a patent stent. The left anterior descending artery is moderately diseased proximally. The mid vessel is occluded. There are large septal perforators which provide collaterals to the distal RCA system. The LIMA to LAD is widely patent The left circumflex artery is a large vessel proximally. The proximal stents are patent. The OM1 is occluded. The proximal portion of the OM1 contains an old bare-metal stent. The remainder of the circumflex is moderate-sized. There are collaterals to the RCA system. The SVG to circumflex is occluded by prior cath. The right coronary artery is occluded proximally. The SVG to RCA is occluded by prior cath. LEFT VENTRICULOGRAM: Left ventricular angiogram was done in the 30 RAO projection and revealed moderately decreased systolic function with an estimated ejection fraction of 35%. There is a basal inferior aneurysm with akinesis in that territory. LVEDP was 19 mmHg. IMPRESSIONS 1. Patent stents in the left main coronary artery. 2. Occluded mid left anterior descending artery. Patent LIMA to LAD. 3. Patent left circumflex artery . Prior bare-metal stent that extends into the OM1 was occluded in the distal portion, just after the ostium of OM1. This area was successfully treated with overlapping Promus drug-eluting stents, 2.25 x 12 and 2.25 x 8, postdilated to 2.5 mm in diameter with the stent balloon.. 4. Occluded right coronary artery. 5. Moderately decreased left ventricular systolic function. LVEDP 19 mmHg. Ejection fraction 35%. RECOMMENDATION: Continue dual antiplatelet therapy indefinitely. He'll need aggressive secondary prevention and therapy for heart failure. His EF appears to have decreased since the last catheterization. This may have been due to the occlusion of the OM1. Hopefully, EF well improved. He'll be  watched overnight.   EKG: sinus bradycardia, rate 51  FOLLOW UP PLANS AND APPOINTMENTS Allergies  Allergen Reactions  . Metoprolol Tartrate Hives and Itching     Medication List    TAKE these medications        ALEVE 220 MG tablet  Generic drug:  naproxen sodium  Take 220 mg by mouth daily as needed (pain).     aspirin 81 MG tablet  Take 81 mg by mouth daily.     carvedilol 3.125 MG tablet  Commonly known as:  COREG  TAKE 1 TABLET BY MOUTH TWICE A DAY     isosorbide mononitrate 30 MG 24 hr tablet  Commonly known as:  IMDUR  Take 1 tablet (30 mg total) by mouth at bedtime.     lisinopril 5 MG tablet  Commonly known as:  PRINIVIL,ZESTRIL  Take 1 tablet (5 mg total) by mouth daily.     mirtazapine 15 MG tablet  Commonly known as:  REMERON  TAKE 1 TABLET BY MOUTH EVERY NIGHT AT BEDTIME AS NEEDED FOR SLEEP.     nitroGLYCERIN 0.4 MG SL tablet  Commonly known as:  NITROSTAT  Place 0.4 mg under the tongue every 5 (five) minutes as needed for chest pain.     omeprazole 20 MG tablet  Commonly known as:  PRILOSEC OTC  Take 20 mg by mouth daily.     prasugrel 10 MG Tabs tablet  Commonly known as:  EFFIENT  Take 1 tablet (10 mg total) by mouth daily.     rosuvastatin 40 MG tablet  Commonly known as:  CRESTOR  Take 1 tablet (40 mg total) by mouth at bedtime.     zoster vaccine live (PF) 19400 UNT/0.65ML injection  Commonly known as:  ZOSTAVAX  Inject 19,400 Units into the skin once.        Discharge Instructions    Diet - low sodium heart healthy    Complete by:  As directed      Increase activity slowly    Complete by:  As directed           Follow-up Information    Follow up with Ida Rogue, MD.   Specialty:  Cardiology   Why:  The office will call   Contact information:   [place] [address] [phone]       [facility] [place]  Time spent with patient to include physician time: 39  min Signed: Rosaria Ferries, PA-C 04/06/2014, 9:21 AM Co-Sign MD

## 2014-04-06 NOTE — Discharge Instructions (Signed)
PLEASE REMEMBER TO BRING ALL OF YOUR MEDICATIONS TO EACH OF YOUR FOLLOW-UP OFFICE VISITS. ° °PLEASE ATTEND ALL SCHEDULED FOLLOW-UP APPOINTMENTS.  ° °Activity: Increase activity slowly as tolerated. You may shower, but no soaking baths (or swimming) for 1 week. No driving for 2 days. No lifting over 5 lbs for 1 week. No sexual activity for 1 week.  ° °You May Return to Work: in 1 week (if applicable) ° °Wound Care: You may wash cath site gently with soap and water. Keep cath site clean and dry. If you notice pain, swelling, bleeding or pus at your cath site, please call [phone]. ° ° ° °Cardiac Cath Site Care °Refer to this sheet in the next few weeks. These instructions provide you with information on caring for yourself after your procedure. Your caregiver may also give you more specific instructions. Your treatment has been planned according to current medical practices, but problems sometimes occur. Call your caregiver if you have any problems or questions after your procedure. °HOME CARE INSTRUCTIONS °· You may shower 24 hours after the procedure. Remove the bandage (dressing) and gently wash the site with plain soap and water. Gently pat the site dry.  °· Do not apply powder or lotion to the site.  °· Do not sit in a bathtub, swimming pool, or whirlpool for 5 to 7 days.  °· No bending, squatting, or lifting anything over 10 pounds (4.5 kg) as directed by your caregiver.  °· Inspect the site at least twice daily.  °· Do not drive home if you are discharged the same day of the procedure. Have someone else drive you.  °· You may drive 24 hours after the procedure unless otherwise instructed by your caregiver.  °What to expect: °· Any bruising will usually fade within 1 to 2 weeks.  °· Blood that collects in the tissue (hematoma) may be painful to the touch. It should usually decrease in size and tenderness within 1 to 2 weeks.  °SEEK IMMEDIATE MEDICAL CARE IF: °· You have unusual pain at the site or down the  affected limb.  °· You have redness, warmth, swelling, or pain at the site.  °· You have drainage (other than a small amount of blood on the dressing).  °· You have chills.  °· You have a fever or persistent symptoms for more than 72 hours.  °· You have a fever and your symptoms suddenly get worse.  °· Your leg becomes pale, cool, tingly, or numb.  °· You have heavy bleeding from the site. Hold pressure on the site.  °Document Released: 05/15/2010 Document Revised: 04/01/2011 Document Reviewed: 05/15/2010 °Exit[employer]® Patient Information ©2012 [employer]. ° °

## 2014-04-06 NOTE — Progress Notes (Signed)
CARDIAC REHAB PHASE I   PRE:  Rate/Rhythm: SR70  BP:  Supine:   Sitting: 146/63  Standing:    SaO2: 97 RA  MODE:  Ambulation: 500 ft   POST:  Rate/Rhythm: 70  BP:  Supine:   Sitting: 176/79  Standing:    SaO2: 99 R  Ambulated in hallway x 1 minimal assist.  Pt denies any cp or sob.  Education completed at at bedside in anticipation of d/c later this morning.  Pt with prior knowledge of CAD risk factor modification due to prior cardiac history.  Handouts provided, pt able to verbalized appropriately NTG protocol and alert 911 for unrelieved cp, verbalized compliance with anti-platelet therapy.  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  Pt ok for contact information to be sent to [facility] outpatient cardiac rehab program.  Pt voiced some concerns related to cost to attend and will use this as a deciding factor on whether to participate (multiple hospital bills he is paying on presently).  Questions answered.  Pt to chair with family at bedside. Cherre Huger, BSN [phone]

## 2014-04-06 NOTE — Progress Notes (Signed)
Patient seen and discussed with PA Barrett, I agree with her documentation. 63 yo male s/p cath 04/05/14 for unstable angina s/p DES x 2 to occluded OM1 previous stent.   Cr and Hgb remain stable postcath. Radial site is soft and non-tender this morning, no significant bleeding.  LVEF 35% by LVgram which is down from last echo in 2012 when LVEF was 50-55%, would recommend outpatient echo at follow up. He is on coreg, lisionpril, imdur, effient, ASA, crestor. Continue current therapies, consider further titration as outpatient if LVEF remains low. Plan for discharge today with close f/u with his primary. Counseled on strict adherence to medications and DAPT.   Zandra Abts MD

## 2014-04-08 ENCOUNTER — Ambulatory Visit: Payer: 59 | Admitting: Cardiovascular Disease

## 2014-04-12 ENCOUNTER — Encounter (INDEPENDENT_AMBULATORY_CARE_PROVIDER_SITE_OTHER): Payer: BC Managed Care – PPO

## 2014-04-12 DIAGNOSIS — I701 Atherosclerosis of renal artery: Secondary | ICD-10-CM

## 2014-04-12 DIAGNOSIS — I714 Abdominal aortic aneurysm, without rupture, unspecified: Secondary | ICD-10-CM

## 2014-04-12 DIAGNOSIS — I10 Essential (primary) hypertension: Secondary | ICD-10-CM

## 2014-04-15 ENCOUNTER — Other Ambulatory Visit: Payer: Self-pay | Admitting: *Deleted

## 2014-04-15 MED ORDER — LISINOPRIL 5 MG PO TABS: 5.0000 mg | ORAL_TABLET | Freq: Every day | ORAL | Status: DC

## 2014-04-24 ENCOUNTER — Other Ambulatory Visit: Payer: Self-pay

## 2014-05-01 ENCOUNTER — Other Ambulatory Visit: Payer: Self-pay

## 2014-05-01 MED ORDER — ROSUVASTATIN CALCIUM 40 MG PO TABS: 40.0000 mg | ORAL_TABLET | Freq: Every day | ORAL | Status: DC

## 2014-05-01 NOTE — Telephone Encounter (Signed)
Refill crestor

## 2014-05-06 ENCOUNTER — Encounter: Payer: Self-pay | Admitting: Cardiovascular Disease

## 2014-05-06 ENCOUNTER — Ambulatory Visit (INDEPENDENT_AMBULATORY_CARE_PROVIDER_SITE_OTHER): Payer: 59 | Admitting: Cardiovascular Disease

## 2014-05-06 VITALS — BP 150/90 | HR 57 | Ht 65.0 in | Wt 154.5 lb

## 2014-05-06 DIAGNOSIS — I25700 Atherosclerosis of coronary artery bypass graft(s), unspecified, with unstable angina pectoris: Secondary | ICD-10-CM

## 2014-05-06 DIAGNOSIS — I701 Atherosclerosis of renal artery: Secondary | ICD-10-CM

## 2014-05-06 DIAGNOSIS — E785 Hyperlipidemia, unspecified: Secondary | ICD-10-CM

## 2014-05-06 DIAGNOSIS — I10 Essential (primary) hypertension: Secondary | ICD-10-CM

## 2014-05-06 MED ORDER — ATORVASTATIN CALCIUM 80 MG PO TABS: 80.0000 mg | ORAL_TABLET | Freq: Every day | ORAL | Status: DC

## 2014-05-06 MED ORDER — NITROGLYCERIN 0.4 MG SL SUBL: 0.4000 mg | SUBLINGUAL_TABLET | SUBLINGUAL | Status: DC | PRN

## 2014-05-06 NOTE — Assessment & Plan Note (Signed)
Blood pressure is well controlled on today's visit. No changes made to the medications. 

## 2014-05-06 NOTE — Assessment & Plan Note (Signed)
He has not been compliant on Crestor secondary to cost. We will start Lipitor 80 mg daily. Goal LDL less than 70

## 2014-05-06 NOTE — Progress Notes (Signed)
Patient ID: [name], male    DOB: September 25, 1950, 64 y.o.   MRN: [id number]  HPI Comments: [name] is a 64 year old gentleman with coronary artery disease, bypass surgery in 1995, stenting at [facility] in February 2004 for MI with a 3.0 x 25 mm and 4.5 x 16 mm Monorail ( location uncertain), also 4.5 x 18 mm stent placed to the mid RCA, followup with repeat stenting in April 2005 to the proximal LAD with a Taxus stent 2.5 x 8 mm, and stenting to the proximal left circumflex with a 2.5 x 12 mm Taxus, with recent stent placed November 13, 2010 with a 4.0 x 9 mm stent placed to the left main. He presents for routine followup of his coronary artery disease. History of renal artery stent. Abdominal aortic aneurysm 3.2 cm Prior smoking history  In followup today, he reports that he is been doing well in terms of his cardiac issues He is not taking his Crestor consistently secondary to cost. Total cholesterol up to 200. Had recent episode of chest pain 04/05/2014, went to [facility] [address] head catheterization. This showed an occluded OM vessel with DES placed 2 Occluded mid LAD with a patent LIMA to the LAD, patent left circumflex, prior bare-metal stent into the OM1 was occluded in the distal portion just after the ostium of OM1. Occluded RCA, ejection fraction 35%  He reports blood pressure at home has been well-controlled with 120/80 on a regular basis EKG on today's visit shows normal sinus rhythm with rate 57 bpm, T-wave abnormality in the inferior leads  Other past medical history Previous episode of chest pain while in [place] with hospitalization at that time, negative stress test, 2013 recurrent chest pain requiring admission to [facility], repeat catheterization performed 12/2011 This showed patent stents to the left circumflex and LAD with patent LIMA to the LAD. Vein graft to the OM and vein graft to the RCA was occluded  admission to [place] at the beginning of July 2014.  Notes from the hospital were reviewed. Ruled out for MI with negative cardiac enzymes, CT scan chest showing no PE. He had stress test 10/25/2012 showing old inferior wall MI with defect noted in the basal to mid inferior wall. Discharged on tramadol.   Ultrasound of his aorta in [place] 12/22/2011 showed distal aorta 3 cm, iliacs of normal size Previous lower extremity ultrasound  showed no significant disease  He used to be a smoker though stopped in 1995. Prior to that he smoked 3 packs per day. Smoked for approximately 25-28 years        Allergies  Allergen Reactions  . Metoprolol Tartrate Hives and Itching    Outpatient Encounter Prescriptions as of 05/06/2014  Medication Sig  . aspirin 81 MG tablet Take 81 mg by mouth daily.  . carvedilol (COREG) 3.125 MG tablet TAKE 1 TABLET BY MOUTH TWICE A DAY  . isosorbide mononitrate (IMDUR) 30 MG 24 hr tablet Take 1 tablet (30 mg total) by mouth at bedtime.  Marland Kitchen lisinopril (PRINIVIL,ZESTRIL) 5 MG tablet Take 1 tablet (5 mg total) by mouth daily.  . mirtazapine (REMERON) 15 MG tablet TAKE 1 TABLET BY MOUTH EVERY NIGHT AT BEDTIME AS NEEDED FOR SLEEP.  . naproxen sodium (ALEVE) 220 MG tablet Take 220 mg by mouth daily as needed (pain).  . nitroGLYCERIN (NITROSTAT) 0.4 MG SL tablet Place 0.4 mg under the tongue every 5 (five) minutes as needed for chest pain.  Marland Kitchen omeprazole (PRILOSEC OTC) 20 MG  tablet Take 20 mg by mouth daily.  . prasugrel (EFFIENT) 10 MG TABS tablet Take 1 tablet (10 mg total) by mouth daily.  . rosuvastatin (CRESTOR) 40 MG tablet Take 1 tablet (40 mg total) by mouth at bedtime.  . zoster vaccine live, PF, (ZOSTAVAX) 75916 UNT/0.65ML injection Inject 19,400 Units into the skin once.    Past Medical History  Diagnosis Date  . CAD (coronary artery disease)     a. 1995 s/p CABG x 4: LIMA->LAD, VG->RI (known to be occluded), VG->AM->PDA;  b. 05/2002 Inf STEMI: VG->AM->PDA 100% treated w/ 2 BMS complicated by acute thrombosis req 3  BMS;  c. 07/2003 DES to  LAD & LCX (VG's to PDA & RI 100%);  d. 12/2010 Acute MI ([place]): DES to LCX & LM , LIMA ok,;  e.12/2011 Cath: LM/LCX stents ok , LIMA patent.  Marland Kitchen GERD (gastroesophageal reflux disease)   . Hyperlipidemia   . Hypertension   . Diverticulitis     1/06 Diverticulitis--CT of pelvis--diffuse sigmoid divertic  . AAA (abdominal aortic aneurysm)     a. Duplex 01/2012: stable infrarenal saccular AAA at 3.3cm x 3.2xm (f/u recommended 01/2013 per Dr. Rockey Situ)  . Renal artery stenosis     a. 03/2011 PTA and stenting of L RA. b. 01/2012 renal duplex - stable 1-59% R renal artery stenosis, normal L renal artery s/p stent.    Past Surgical History  Procedure Laterality Date  . Coronary artery bypass graft    . Closed reduction shoulder dislocation    . Angioplasty  1997  . Coronary stent placement  7/12    2 stents--Promus element plus (everolimus eluting)--[facility] in [place]  . Renal artery stent  03/2011 ?  Marland Kitchen Renal angiogram N/A 04/16/2011    Procedure: RENAL ANGIOGRAM;  Surgeon: Burnell Blanks, MD;  Location: [facility] CATH LAB;  Service: Cardiovascular;  Laterality: N/A;  . Left heart catheterization with coronary/graft angiogram N/A 01/04/2012    Procedure: LEFT HEART CATHETERIZATION WITH Beatrix Fetters;  Surgeon: Burnell Blanks, MD;  Location: [facility] CATH LAB;  Service: Cardiovascular;  Laterality: N/A;  . Left heart catheterization with coronary/graft angiogram N/A 04/05/2014    Procedure: LEFT HEART CATHETERIZATION WITH Beatrix Fetters;  Surgeon: Jettie Booze, MD;  Location: [facility] CATH LAB;  Service: Cardiovascular;  Laterality: N/A;  . Percutaneous coronary stent intervention (pci-s)  04/05/2014    Procedure: PERCUTANEOUS CORONARY STENT INTERVENTION (PCI-S);  Surgeon: Jettie Booze, MD;  Location: [facility] CATH LAB;  Service: Cardiovascular;;  OM1  . Cardiac catheterization      8/09  Cath--vein graft occlusions which are old--no acute  changes  . Cardiac catheterization  11/13/2010    stent x 2 @ [place]  . Cardiac catheterization  04/05/2014    stent placement   . Coronary angioplasty  04/05/2014    stent placement OM 1    Social History  reports that he quit smoking about 20 years ago. His smoking use included Cigarettes. He has a 75 pack-year smoking history. He has never used smokeless tobacco. He reports that he drinks about 8.4 oz of alcohol per week. He reports that he does not use illicit drugs.  Family History family history includes Coronary artery disease in his brother, father, mother, and sister; Heart attack in his father, maternal grandfather, and mother; Hypertension in his mother. There is no history of Diabetes or Cancer.   Review of Systems  Constitutional: Negative.   Respiratory: Negative.   Cardiovascular: Negative.  Gastrointestinal: Negative.   Musculoskeletal: Positive for back pain and gait problem.       Bilateral leg pain  Skin: Negative.   Neurological: Negative.   Hematological: Negative.   Psychiatric/Behavioral: Negative.   All other systems reviewed and are negative.   BP 150/90 mmHg  Pulse 57  Ht 5\' 5"  (1.651 m)  Wt 154 lb 8 oz (70.081 kg)  BMI 25.71 kg/m2  Physical Exam  Constitutional: He is oriented to person, place, and time. He appears well-developed and well-nourished.  HENT:  Head: Normocephalic.  Nose: Nose normal.  Mouth/Throat: Oropharynx is clear and moist.  Eyes: Conjunctivae are normal. Pupils are equal, round, and reactive to light.  Neck: Normal range of motion. Neck supple. No JVD present.  Cardiovascular: Normal rate, regular rhythm, S1 normal, S2 normal, normal heart sounds and intact distal pulses.  Exam reveals no gallop and no friction rub.   No murmur heard. Pulmonary/Chest: Effort normal and breath sounds normal. No respiratory distress. He has no wheezes. He has no rales. He exhibits no tenderness.  Abdominal: Soft. Bowel sounds are normal. He  exhibits no distension. There is no tenderness.  Musculoskeletal: Normal range of motion. He exhibits no edema or tenderness.  Lymphadenopathy:    He has no cervical adenopathy.  Neurological: He is alert and oriented to person, place, and time. Coordination normal.  Skin: Skin is warm and dry. No rash noted. No erythema.  Psychiatric: He has a normal mood and affect. His behavior is normal. Judgment and thought content normal.      Assessment and Plan   Nursing note and vitals reviewed.

## 2014-05-06 NOTE — Assessment & Plan Note (Addendum)
Currently with no symptoms of angina. No further workup at this time. Continue current medication regimen. Recent catheterization with DES 2 placed, results were reviewed with him

## 2014-05-06 NOTE — Patient Instructions (Addendum)
Please change the crestor to atorvastatin one a day for cholesterol  Please call us if you have new issues that need to be addressed before your next appt.  Your physician wants you to follow-up in: 6 months.  You will receive a reminder letter in the mail two months in advance. If you don't receive a letter, please call our office to schedule the follow-up appointment.

## 2014-05-06 NOTE — Assessment & Plan Note (Signed)
Renal artery ultrasound December 2015 showing patent stent. Repeat in one year

## 2014-05-14 ENCOUNTER — Telehealth: Payer: Self-pay

## 2014-05-14 NOTE — Telephone Encounter (Signed)
Left message on pt's wife's vm that I am leaving new Effient samples at the front desk, as well as samples at the front desk for her to pick up at her convenience.

## 2014-05-14 NOTE — Telephone Encounter (Signed)
Pt wife called states pt needs a new Effient copay card, the one they have is expired.

## 2014-07-03 ENCOUNTER — Telehealth: Payer: Self-pay

## 2014-07-03 NOTE — Telephone Encounter (Signed)
Received fax from [facility] that pt "is not interested because cost of program is a financial hardship".

## 2014-07-21 ENCOUNTER — Other Ambulatory Visit: Payer: Self-pay | Admitting: Internal Medicine

## 2014-07-22 NOTE — Telephone Encounter (Signed)
Last office visit 12/20/2014.  Last refilled 03/14/2014 for #90 with no refills.  Ok to refill?

## 2014-07-22 NOTE — Telephone Encounter (Signed)
Called to [facility]. 

## 2014-08-07 ENCOUNTER — Encounter: Payer: Self-pay | Admitting: Primary Care

## 2014-08-07 ENCOUNTER — Ambulatory Visit (INDEPENDENT_AMBULATORY_CARE_PROVIDER_SITE_OTHER)
Admission: RE | Admit: 2014-08-07 | Discharge: 2014-08-07 | Disposition: A | Discharge status: Home / Self Care | Payer: 59 | Source: Ambulatory Visit | Attending: Primary Care | Admitting: Primary Care

## 2014-08-07 ENCOUNTER — Ambulatory Visit (INDEPENDENT_AMBULATORY_CARE_PROVIDER_SITE_OTHER): Payer: 59 | Admitting: Primary Care

## 2014-08-07 VITALS — BP 142/104 | HR 61 | Temp 97.4°F | Ht 65.0 in | Wt 152.4 lb

## 2014-08-07 DIAGNOSIS — R1030 Lower abdominal pain, unspecified: Secondary | ICD-10-CM | POA: Insufficient documentation

## 2014-08-07 DIAGNOSIS — K5792 Diverticulitis of intestine, part unspecified, without perforation or abscess without bleeding: Secondary | ICD-10-CM | POA: Diagnosis not present

## 2014-08-07 LAB — CBC WITH DIFFERENTIAL/PLATELET
Basophils Absolute: 0.1 10*3/uL (ref 0.0–0.1)
Basophils Relative: 0.6 % (ref 0.0–3.0)
EOS PCT: 2.6 % (ref 0.0–5.0)
Eosinophils Absolute: 0.2 10*3/uL (ref 0.0–0.7)
HCT: 41.7 % (ref 39.0–52.0)
HEMOGLOBIN: 14.1 g/dL (ref 13.0–17.0)
Lymphocytes Relative: 27.9 % (ref 12.0–46.0)
Lymphs Abs: 2.5 10*3/uL (ref 0.7–4.0)
MCHC: 33.8 g/dL (ref 30.0–36.0)
MCV: 96.8 fl (ref 78.0–100.0)
MONOS PCT: 10.5 % (ref 3.0–12.0)
Monocytes Absolute: 0.9 10*3/uL (ref 0.1–1.0)
NEUTROS PCT: 58.4 % (ref 43.0–77.0)
Neutro Abs: 5.2 10*3/uL (ref 1.4–7.7)
Platelets: 283 10*3/uL (ref 150.0–400.0)
RBC: 4.31 Mil/uL (ref 4.22–5.81)
RDW: 16.3 % — ABNORMAL HIGH (ref 11.5–15.5)
WBC: 8.8 10*3/uL (ref 4.0–10.5)

## 2014-08-07 LAB — COMPREHENSIVE METABOLIC PANEL
ALT: 16 U/L (ref 0–53)
AST: 19 U/L (ref 0–37)
Albumin: 3.9 g/dL (ref 3.5–5.2)
Alkaline Phosphatase: 70 U/L (ref 39–117)
BILIRUBIN TOTAL: 0.8 mg/dL (ref 0.2–1.2)
BUN: 8 mg/dL (ref 6–23)
CO2: 28 mEq/L (ref 19–32)
Calcium: 9.7 mg/dL (ref 8.4–10.5)
Chloride: 99 mEq/L (ref 96–112)
Creatinine, Ser: 0.81 mg/dL (ref 0.40–1.50)
GFR: 102 mL/min (ref >60.00)
GLUCOSE: 101 mg/dL — AB (ref 70–99)
Potassium: 3.6 mEq/L (ref 3.5–5.1)
Sodium: 134 mEq/L — ABNORMAL LOW (ref 135–145)
TOTAL PROTEIN: 7.3 g/dL (ref 6.0–8.3)

## 2014-08-07 MED ORDER — METRONIDAZOLE 500 MG PO TABS
500.0000 mg | ORAL_TABLET | Freq: Three times a day (TID) | ORAL | Status: DC
Start: 2014-08-07 — End: 2014-12-25

## 2014-08-07 MED ORDER — HYDROCODONE-ACETAMINOPHEN 5-325 MG PO TABS: 1.0000 | ORAL_TABLET | Freq: Four times a day (QID) | ORAL | Status: DC | PRN

## 2014-08-07 MED ORDER — CIPROFLOXACIN HCL 500 MG PO TABS: 500.0000 mg | ORAL_TABLET | Freq: Two times a day (BID) | ORAL | Status: DC

## 2014-08-07 MED ORDER — IOHEXOL 300 MG/ML  SOLN: 100.0000 mL | Freq: Once | INTRAMUSCULAR | Status: AC | PRN

## 2014-08-07 NOTE — Progress Notes (Signed)
Pre visit review using our clinic review tool, if applicable. No additional management support is needed unless otherwise documented below in the visit note. 

## 2014-08-07 NOTE — Progress Notes (Signed)
Subjective:    Patient ID: [name], male    DOB: 1950/12/04, 64 y.o.   MRN: [id number]  HPI  [name] is a 64 year old male who presents today with a chief complaint of bilateral lower quadrant abdominal pain that started Monday morning. He has a history of diverticulitis, last episode was 1-2 years ago, and has had flare-ups 5-6 times over the past several years.  He has a history of loose stools daily in the morning so he originally attributed his pain to diarrhea. Starting this morning around 9-10am his pain worsened. He has not taken anything for his pain today and reports a decrease in his appetite. Denies bloody stools, nausea, vomiting, fevers.   Review of Systems  Constitutional: Positive for appetite change. Negative for fever and chills.       Feeling more tired than normal.  Respiratory: Negative for shortness of breath.   Cardiovascular: Negative for chest pain.  Gastrointestinal: Positive for abdominal pain. Negative for nausea, vomiting, diarrhea and blood in stool.  Neurological: Negative for dizziness.       Past Medical History  Diagnosis Date  . CAD (coronary artery disease)     a. 1995 s/p CABG x 4: LIMA->LAD, VG->RI (known to be occluded), VG->AM->PDA;  b. 05/2002 Inf STEMI: VG->AM->PDA 100% treated w/ 2 BMS complicated by acute thrombosis req 3 BMS;  c. 07/2003 DES to  LAD & LCX (VG's to PDA & RI 100%);  d. 12/2010 Acute [place] ([place]): DES to LCX & LM , LIMA ok,;  e.12/2011 Cath: LM/LCX stents ok , LIMA patent.  Marland Kitchen GERD (gastroesophageal reflux disease)   . Hyperlipidemia   . Hypertension   . Diverticulitis     1/06 Diverticulitis--CT of pelvis--diffuse sigmoid divertic  . AAA (abdominal aortic aneurysm)     a. Duplex 01/2012: stable infrarenal saccular AAA at 3.3cm x 3.2xm (f/u recommended 01/2013 per Dr. Rockey Situ)  . Renal artery stenosis     a. 03/2011 PTA and stenting of L RA. b. 01/2012 renal duplex - stable 1-59% R renal artery stenosis, normal L renal artery  s/p stent.    History   Social History  . Marital Status: Married    Spouse Name: N/A  . Number of Children: 3  . Years of Education: N/A   Occupational History  . Geneticist, molecular (Animal nutritionist)     own business now   Social History Main Topics  . Smoking status: Former Smoker -- 3.00 packs/day for 25 years    Types: Cigarettes    Quit date: 05/28/1993  . Smokeless tobacco: Never Used  . Alcohol Use: 8.4 oz/week    14 Cans of beer per week  . Drug Use: No  . Sexual Activity: Not on file   Other Topics Concern  . Not on file   Social History Narrative    Past Surgical History  Procedure Laterality Date  . Coronary artery bypass graft    . Closed reduction shoulder dislocation    . Angioplasty  1997  . Coronary stent placement  7/12    2 stents--Promus element plus (everolimus eluting)--[facility] in [place]  . Renal artery stent  03/2011 ?  Marland Kitchen Renal angiogram N/A 04/16/2011    Procedure: RENAL ANGIOGRAM;  Surgeon: Burnell Blanks, MD;  Location: [facility] CATH LAB;  Service: Cardiovascular;  Laterality: N/A;  . Left heart catheterization with coronary/graft angiogram N/A 01/04/2012    Procedure: LEFT HEART CATHETERIZATION WITH CORONARY/GRAFT ANGIOGRAM;  Surgeon: Harrell Gave  Santina Evans, MD;  Location: [facility] CATH LAB;  Service: Cardiovascular;  Laterality: N/A;  . Left heart catheterization with coronary/graft angiogram N/A 04/05/2014    Procedure: LEFT HEART CATHETERIZATION WITH Beatrix Fetters;  Surgeon: Jettie Booze, MD;  Location: [facility] CATH LAB;  Service: Cardiovascular;  Laterality: N/A;  . Percutaneous coronary stent intervention (pci-s)  04/05/2014    Procedure: PERCUTANEOUS CORONARY STENT INTERVENTION (PCI-S);  Surgeon: Jettie Booze, MD;  Location: [facility] CATH LAB;  Service: Cardiovascular;;  OM1  . Cardiac catheterization      8/09  Cath--vein graft occlusions which are old--no acute changes  . Cardiac catheterization  11/13/2010      stent x 2 @ [place]  . Cardiac catheterization  04/05/2014    stent placement   . Coronary angioplasty  04/05/2014    stent placement OM 1    Family History  Problem Relation Age of Onset  . Coronary artery disease Mother     Died MI age 53  . Hypertension Mother   . Heart attack Mother   . Coronary artery disease Sister     Living  . Coronary artery disease Brother     Living  . Diabetes Neg Hx   . Cancer Neg Hx     prostate or colon  . Coronary artery disease Father     Died MI age 3  . Heart attack Father   . Heart attack Maternal Grandfather     Allergies  Allergen Reactions  . Metoprolol Tartrate Hives and Itching    Current Outpatient Prescriptions on File Prior to Visit  Medication Sig Dispense Refill  . aspirin 81 MG tablet Take 81 mg by mouth daily.    Marland Kitchen atorvastatin (LIPITOR) 80 MG tablet Take 1 tablet (80 mg total) by mouth daily. 90 tablet 3  . carvedilol (COREG) 3.125 MG tablet TAKE 1 TABLET BY MOUTH TWICE A DAY 180 tablet 3  . isosorbide mononitrate (IMDUR) 30 MG 24 hr tablet Take 1 tablet (30 mg total) by mouth at bedtime. 30 tablet 6  . lisinopril (PRINIVIL,ZESTRIL) 5 MG tablet Take 1 tablet (5 mg total) by mouth daily. 30 tablet 3  . mirtazapine (REMERON) 15 MG tablet TAKE 1 TABLET BY MOUTH EVERY NIGHT AT BEDTIME AS NEEDED. FOR SLEEP 90 tablet 1  . naproxen sodium (ALEVE) 220 MG tablet Take 220 mg by mouth daily as needed (pain).    . nitroGLYCERIN (NITROSTAT) 0.4 MG SL tablet Place 1 tablet (0.4 mg total) under the tongue every 5 (five) minutes as needed for chest pain. 25 tablet 6  . omeprazole (PRILOSEC OTC) 20 MG tablet Take 20 mg by mouth daily.    . prasugrel (EFFIENT) 10 MG TABS tablet Take 1 tablet (10 mg total) by mouth daily. 30 tablet 6  . zoster vaccine live, PF, (ZOSTAVAX) 56256 UNT/0.65ML injection Inject 19,400 Units into the skin once. 1 each 0   No current facility-administered medications on file prior to visit.    BP 142/104  mmHg  Pulse 61  Temp(Src) 97.4 F (36.3 C) (Oral)  Ht 5\' 5"  (1.651 m)  Wt 152 lb 6.4 oz (69.128 kg)  BMI 25.36 kg/m2  SpO2 98%    Objective:   Physical Exam  Constitutional: He is oriented to person, place, and time.  Does not appear sickly, but does appear to be in pain.  Neck: Neck supple.  Cardiovascular: Normal rate and regular rhythm.   Pulmonary/Chest: Effort normal and breath sounds normal.  Abdominal:  Soft. He exhibits distension. There is no hepatomegaly. There is tenderness in the right lower quadrant and left lower quadrant.  Moderately tender to RLQ and LLQ.   Lymphadenopathy:    He has no cervical adenopathy.  Neurological: He is alert and oriented to person, place, and time.  Skin: Skin is warm and dry.          Assessment & Plan:

## 2014-08-07 NOTE — Assessment & Plan Note (Addendum)
Moderately tender to LLQ and RLQ. Suspect either appendicitis or diverticulitis. Will obtain stat CBC, CMP, and CT abdomen pelvis. Will stand by for call from CT center. Patient understands the importance of stat imaging and is going now. If diverticulitis will treat, if appendicitis will send to emergency department.

## 2014-08-07 NOTE — Patient Instructions (Signed)
Complete lab work prior to leaving today. You will get your CT scan set up with the front desk. I will call you will all of your results this afternoon.

## 2014-08-08 ENCOUNTER — Telehealth: Payer: Self-pay

## 2014-08-08 ENCOUNTER — Telehealth: Payer: Self-pay | Admitting: Primary Care

## 2014-08-08 NOTE — Telephone Encounter (Signed)
Will you check on [name] today? He was found to have diverticulitis yesterday. Would you also please inform him that I would like to see him either next Wednesday or Thursday for follow up and help him schedule?  Thanks!

## 2014-08-08 NOTE — Assessment & Plan Note (Signed)
Positive as evidenced by CT scan. Treated with Cipro, Flagyl, and Hydrocodone. Discussed the treatment regimen with patient who verbalized understanding. He is to follow up if he experiences worsening pain, fevers, vomiting, etc. Follow up in one week.

## 2014-08-08 NOTE — Telephone Encounter (Signed)
Called and spoken to patient. Patient is doing about the same as yesterday but not going worse which is good. Patient stated that he will call back later to make a follow up appt.

## 2014-08-08 NOTE — Telephone Encounter (Signed)
PLEASE NOTE: All timestamps contained within this report are represented as [place] Standard Time. CONFIDENTIALTY NOTICE: This fax transmission is intended only for the addressee. It contains information that is legally privileged, confidential or otherwise protected from use or disclosure. If you are not the intended recipient, you are strictly prohibited from reviewing, disclosing, copying using or disseminating any of this information or taking any action in reliance on or regarding this information. If you have received this fax in error, please notify us immediately by telephone so that we can arrange for its return to [place]. Phone: [phone], Toll-Free: [phone], Fax: [phone] Page: 1 of 1 Call Id: [id number] [place] Patient Name: [name] Gender: Male DOB: 01/21/1951 Age: 65 Y 10 M 21 D Return Phone Number: [phone] (Primary), [phone] (Secondary) Address: [address] City/State/Zip: [place] [address] Client [place] Night - Client Client Site [place] - Night Contact Type Call Call Type Triage / [place] Name sue Relationship To Patient Spouse Return Phone Number [phone] (Primary) Chief Complaint Prescription Refill or Medication Request (non symptomatic) Initial Comment Caller states her husband had CAT scan. He was supposed to have a Rx called into the pharmacy. Rx Dr. Carlis Abbott Nurse Assessment Nurse: Denyse Amass, RN, Benjamine Mola Date/Time Eilene Ghazi Time): 08/07/2014 5:15:31 PM Confirm and document reason for call. If symptomatic, describe symptoms. ---Wife calling to say her son is on he s way to pick up Rx for pain medication for the patient. Back line called, son just picked up the Rx. Wife informed. Has the patient traveled out of the country within the last 30 days? ---Not  Applicable Does the patient require triage? ---No Guidelines Guideline Title Affirmed Question Affirmed Notes Nurse Date/Time (Eastern Time) Disp. Time Eilene Ghazi Time) Disposition Final User 08/07/2014 5:16:48 PM Clinical Call Yes [place], RN, Benjamine Mola After Care Instructions Given Call Event Type User Date / Time Description

## 2014-08-08 NOTE — Telephone Encounter (Signed)
Issue resolved; rx already picked up.

## 2014-08-09 ENCOUNTER — Telehealth: Payer: Self-pay | Admitting: Internal Medicine

## 2014-08-09 DIAGNOSIS — K5792 Diverticulitis of intestine, part unspecified, without perforation or abscess without bleeding: Secondary | ICD-10-CM

## 2014-08-09 MED ORDER — ONDANSETRON 4 MG PO TBDP: 4.0000 mg | ORAL_TABLET | Freq: Three times a day (TID) | ORAL | Status: DC | PRN

## 2014-08-09 MED ORDER — AMOXICILLIN-POT CLAVULANATE 875-125 MG PO TABS
1.0000 | ORAL_TABLET | Freq: Two times a day (BID) | ORAL | Status: DC
Start: 2014-08-09 — End: 2014-12-25

## 2014-08-09 NOTE — Telephone Encounter (Signed)
Pt spouse called really concerned b/c every time patient eats (it does not matter what it is) he feels really full and then has 10+ bowel movements a day. Pt spouse Manuela Schwartz would like to know what she needs to do? Please advise  Dr. Silvio Pate is out of the office until 08/13/14. Will route to him as FYI.  Routing to another provider in office.

## 2014-08-09 NOTE — Telephone Encounter (Signed)
[name] called back: she wanted to speak with [name] b/c she is the one who prescribed the abx for his stomach. [name] states she thinks the medication is upsetting his stomach. She would like a call back today b/c [facility] closes at 6pm and would like another medication called in.  Will route to [name] for advice

## 2014-08-09 NOTE — Telephone Encounter (Signed)
I suspect the metronidazole is causing his abdominal irritability, will switch to Augmentin 875 BID. Also sent in Zofran for nausea. Instructed his wife Manuela Schwartz) to take him to the emergency department if he experiences any worsening pain, fevers, chills over the weekend.  She verbalized understanding of the care plan.

## 2014-08-10 NOTE — Telephone Encounter (Signed)
Please check on him on Monday 

## 2014-08-12 NOTE — Telephone Encounter (Signed)
Spoke with wife and she states husband is doing a lot better.

## 2014-08-12 NOTE — Telephone Encounter (Signed)
Good to hear

## 2014-08-20 ENCOUNTER — Telehealth: Payer: Self-pay | Admitting: *Deleted

## 2014-08-20 NOTE — Telephone Encounter (Signed)
To Dr. Gollan to review.  

## 2014-08-20 NOTE — Telephone Encounter (Signed)
It is too soon to stop the blood thinners Typically after stent placement, need one year If an emergency, could do after 6 months Stent was placed December 2015 Has only been 4 months Would delay dental surgery if possible

## 2014-08-20 NOTE — Telephone Encounter (Signed)
Request for surgical clearance:  1. What type of surgery is being performed? Teeth being pulled   2. When is this surgery scheduled? Not yet  3. Are there any medications that need to be held prior to surgery and how long? No sure, this is the question as well. He knows he needs to be off 5 days before and 2 days after Asprin and blood thinner.   4. Name of physician performing surgery? [facility] in [place], not sure who will be doing this.   5. What is your office phone and fax number? Phone number [phone]  Pt is asking also does he need to come in for an appointment. Or can he get the okay without coming to see [place]. He is trying to get this scheduled as soon as he gets the okay from [place].  Please advise.

## 2014-08-21 NOTE — Telephone Encounter (Signed)
Spoke w/ pt. He reports that his teeth are "so rotten" that he cannot eat solid foods. He has been on liquid diet and has exposed nerves causing a great deal of pain.  Advised him of Dr. Donivan Scull recommendation and discussed this at length w/ him.  He asks that I make Dr. Rockey Situ aware of his situation. He states that he will discuss w/ his wife and call back w/ their decision.

## 2014-09-04 ENCOUNTER — Other Ambulatory Visit: Payer: Self-pay | Admitting: Cardiovascular Disease

## 2014-09-29 ENCOUNTER — Other Ambulatory Visit: Payer: Self-pay | Admitting: Cardiovascular Disease

## 2014-10-15 ENCOUNTER — Other Ambulatory Visit: Payer: Self-pay | Admitting: Cardiovascular Disease

## 2014-12-25 ENCOUNTER — Encounter: Payer: Self-pay | Admitting: Internal Medicine

## 2014-12-25 ENCOUNTER — Ambulatory Visit (INDEPENDENT_AMBULATORY_CARE_PROVIDER_SITE_OTHER): Payer: 59 | Admitting: Internal Medicine

## 2014-12-25 VITALS — BP 120/80 | HR 55 | Temp 98.3°F | Wt 158.0 lb

## 2014-12-25 DIAGNOSIS — K5732 Diverticulitis of large intestine without perforation or abscess without bleeding: Secondary | ICD-10-CM | POA: Diagnosis not present

## 2014-12-25 MED ORDER — AMOXICILLIN-POT CLAVULANATE 875-125 MG PO TABS: 1.0000 | ORAL_TABLET | Freq: Two times a day (BID) | ORAL | Status: DC

## 2014-12-25 NOTE — Progress Notes (Signed)
Pre visit review using our clinic review tool, if applicable. No additional management support is needed unless otherwise documented below in the visit note. 

## 2014-12-25 NOTE — Assessment & Plan Note (Addendum)
Recurrent--same spot.  No peritonitis Prefers no analgesics--- has some left over narcotics though Keeping up with fluids Will give augmentin since he tolerated---refill since this has been recurrent and classic/typical symptoms each time If recurs soon, or hospitalized, he should really consider sigmoid resection (prefers to defer for now)

## 2014-12-25 NOTE — Progress Notes (Signed)
Subjective:    Patient ID: [name], male    DOB: Apr 26, 1951, 64 y.o.   MRN: [id number]  HPI Here due to abdominal pain  Feels like he is having the same diverticulitis Started with sense of gas bubble about 3PM yesterday Then more striking stabbing LLQ pain Better briefly with BM Has been going regularly--loose stools Some nausea but no vomiting Pain can be fairly bad at [facility] with walking around. Better lying down Appetite is off No fever No chills or sweats  Current Outpatient Prescriptions on File Prior to Visit  Medication Sig Dispense Refill  . aspirin 81 MG tablet Take 81 mg by mouth daily.    Marland Kitchen atorvastatin (LIPITOR) 80 MG tablet Take 1 tablet (80 mg total) by mouth daily. 90 tablet 3  . carvedilol (COREG) 3.125 MG tablet TAKE 1 TABLET BY MOUTH TWICE A DAY 180 tablet 3  . EFFIENT 10 MG TABS tablet TAKE 1 TABLET BY MOUTH DAILY 30 each 3  . HYDROcodone-acetaminophen (NORCO/VICODIN) 5-325 MG per tablet Take 1-2 tablets by mouth every 6 (six) hours as needed for moderate pain. 30 tablet 0  . isosorbide mononitrate (IMDUR) 30 MG 24 hr tablet TAKE ONE TABLET BY MOUTH EVERY NIGHT AT BEDTIME 30 tablet 6  . lisinopril (PRINIVIL,ZESTRIL) 5 MG tablet TAKE 1 TABLET BY MOUTH DAILY 30 tablet 3  . mirtazapine (REMERON) 15 MG tablet TAKE 1 TABLET BY MOUTH EVERY NIGHT AT BEDTIME AS NEEDED. FOR SLEEP 90 tablet 1  . naproxen sodium (ALEVE) 220 MG tablet Take 220 mg by mouth daily as needed (pain).    . nitroGLYCERIN (NITROSTAT) 0.4 MG SL tablet Place 1 tablet (0.4 mg total) under the tongue every 5 (five) minutes as needed for chest pain. 25 tablet 6  . omeprazole (PRILOSEC OTC) 20 MG tablet Take 20 mg by mouth daily.     No current facility-administered medications on file prior to visit.    Allergies  Allergen Reactions  . Metoprolol Tartrate Hives and Itching    Past Medical History  Diagnosis Date  . CAD (coronary artery disease)     a. 1995 s/p CABG x 4: LIMA->LAD,  VG->RI (known to be occluded), VG->AM->PDA;  b. 05/2002 Inf STEMI: VG->AM->PDA 100% treated w/ 2 BMS complicated by acute thrombosis req 3 BMS;  c. 07/2003 DES to  LAD & LCX (VG's to PDA & RI 100%);  d. 12/2010 Acute [place] ([place]): DES to LCX & LM , LIMA ok,;  e.12/2011 Cath: LM/LCX stents ok , LIMA patent.  Marland Kitchen GERD (gastroesophageal reflux disease)   . Hyperlipidemia   . Hypertension   . Diverticulitis     1/06 Diverticulitis--CT of pelvis--diffuse sigmoid divertic  . AAA (abdominal aortic aneurysm)     a. Duplex 01/2012: stable infrarenal saccular AAA at 3.3cm x 3.2xm (f/u recommended 01/2013 per Dr. Rockey Situ)  . Renal artery stenosis     a. 03/2011 PTA and stenting of L RA. b. 01/2012 renal duplex - stable 1-59% R renal artery stenosis, normal L renal artery s/p stent.    Past Surgical History  Procedure Laterality Date  . Coronary artery bypass graft    . Closed reduction shoulder dislocation    . Angioplasty  1997  . Coronary stent placement  7/12    2 stents--Promus element plus (everolimus eluting)--[facility] in [place]  . Renal artery stent  03/2011 ?  Marland Kitchen Renal angiogram N/A 04/16/2011    Procedure: RENAL ANGIOGRAM;  Surgeon: Burnell Blanks, MD;  Location:  [place] CATH LAB;  Service: Cardiovascular;  Laterality: N/A;  . Left heart catheterization with coronary/graft angiogram N/A 01/04/2012    Procedure: LEFT HEART CATHETERIZATION WITH Beatrix Fetters;  Surgeon: Burnell Blanks, MD;  Location: [facility] CATH LAB;  Service: Cardiovascular;  Laterality: N/A;  . Left heart catheterization with coronary/graft angiogram N/A 04/05/2014    Procedure: LEFT HEART CATHETERIZATION WITH Beatrix Fetters;  Surgeon: Jettie Booze, MD;  Location: [facility] CATH LAB;  Service: Cardiovascular;  Laterality: N/A;  . Percutaneous coronary stent intervention (pci-s)  04/05/2014    Procedure: PERCUTANEOUS CORONARY STENT INTERVENTION (PCI-S);  Surgeon: Jettie Booze, MD;  Location:  [facility] CATH LAB;  Service: Cardiovascular;;  OM1  . Cardiac catheterization      8/09  Cath--vein graft occlusions which are old--no acute changes  . Cardiac catheterization  11/13/2010    stent x 2 @ [place]  . Cardiac catheterization  04/05/2014    stent placement   . Coronary angioplasty  04/05/2014    stent placement OM 1    Family History  Problem Relation Age of Onset  . Coronary artery disease Mother     Died MI age 45  . Hypertension Mother   . Heart attack Mother   . Coronary artery disease Sister     Living  . Coronary artery disease Brother     Living  . Diabetes Neg Hx   . Cancer Neg Hx     prostate or colon  . Coronary artery disease Father     Died MI age 61  . Heart attack Father   . Heart attack Maternal Grandfather     Social History   Social History  . Marital Status: Married    Spouse Name: N/A  . Number of Children: 3  . Years of Education: N/A   Occupational History  . Geneticist, molecular (Animal nutritionist)     own business now   Social History Main Topics  . Smoking status: Former Smoker -- 3.00 packs/day for 25 years    Types: Cigarettes    Quit date: 05/28/1993  . Smokeless tobacco: Never Used  . Alcohol Use: 8.4 oz/week    14 Cans of beer per week  . Drug Use: No  . Sexual Activity: Not on file   Other Topics Concern  . Not on file   Social History Narrative    Review of Systems Sore throat this AM No cough No sig SOB    Objective:   Physical Exam  Constitutional: He appears well-developed and well-nourished. No distress.  Pulmonary/Chest: Effort normal and breath sounds normal. No respiratory distress. He has no wheezes. He has no rales.  Abdominal: Bowel sounds are normal.  Moderate LLQ tenderness No peritoneal signs Right side is benign          Assessment & Plan:

## 2014-12-27 ENCOUNTER — Encounter: Payer: 59 | Admitting: Internal Medicine

## 2014-12-31 ENCOUNTER — Encounter: Payer: Self-pay | Admitting: Internal Medicine

## 2014-12-31 ENCOUNTER — Ambulatory Visit (INDEPENDENT_AMBULATORY_CARE_PROVIDER_SITE_OTHER): Payer: 59 | Admitting: Internal Medicine

## 2014-12-31 VITALS — BP 138/80 | HR 58 | Temp 98.4°F | Ht 65.0 in | Wt 155.0 lb

## 2014-12-31 DIAGNOSIS — Z0000 Encounter for general adult medical examination without abnormal findings: Secondary | ICD-10-CM | POA: Diagnosis not present

## 2014-12-31 DIAGNOSIS — I25119 Atherosclerotic heart disease of native coronary artery with unspecified angina pectoris: Secondary | ICD-10-CM | POA: Diagnosis not present

## 2014-12-31 DIAGNOSIS — G479 Sleep disorder, unspecified: Secondary | ICD-10-CM

## 2014-12-31 DIAGNOSIS — Z1211 Encounter for screening for malignant neoplasm of colon: Secondary | ICD-10-CM

## 2014-12-31 DIAGNOSIS — I701 Atherosclerosis of renal artery: Secondary | ICD-10-CM

## 2014-12-31 DIAGNOSIS — I714 Abdominal aortic aneurysm, without rupture, unspecified: Secondary | ICD-10-CM

## 2014-12-31 DIAGNOSIS — Z23 Encounter for immunization: Secondary | ICD-10-CM

## 2014-12-31 LAB — T4, FREE: FREE T4: 0.9 ng/dL (ref 0.60–1.60)

## 2014-12-31 LAB — COMPREHENSIVE METABOLIC PANEL
ALT: 19 U/L (ref 0–53)
AST: 22 U/L (ref 0–37)
Albumin: 4.1 g/dL (ref 3.5–5.2)
Alkaline Phosphatase: 59 U/L (ref 39–117)
BUN: 7 mg/dL (ref 6–23)
CHLORIDE: 99 meq/L (ref 96–112)
CO2: 28 meq/L (ref 19–32)
Calcium: 9.9 mg/dL (ref 8.4–10.5)
Creatinine, Ser: 0.73 mg/dL (ref 0.40–1.50)
GFR: 114.86 mL/min (ref >60.00)
GLUCOSE: 100 mg/dL — AB (ref 70–99)
POTASSIUM: 4.1 meq/L (ref 3.5–5.1)
SODIUM: 136 meq/L (ref 135–145)
Total Bilirubin: 0.6 mg/dL (ref 0.2–1.2)
Total Protein: 7.1 g/dL (ref 6.0–8.3)

## 2014-12-31 LAB — CBC WITH DIFFERENTIAL/PLATELET
BASOS PCT: 0.7 % (ref 0.0–3.0)
Basophils Absolute: 0 10*3/uL (ref 0.0–0.1)
EOS PCT: 3.7 % (ref 0.0–5.0)
Eosinophils Absolute: 0.2 10*3/uL (ref 0.0–0.7)
HCT: 40.7 % (ref 39.0–52.0)
HEMOGLOBIN: 13.6 g/dL (ref 13.0–17.0)
Lymphocytes Relative: 33 % (ref 12.0–46.0)
Lymphs Abs: 2.1 10*3/uL (ref 0.7–4.0)
MCHC: 33.5 g/dL (ref 30.0–36.0)
MCV: 97.2 fl (ref 78.0–100.0)
MONO ABS: 0.6 10*3/uL (ref 0.1–1.0)
Monocytes Relative: 10 % (ref 3.0–12.0)
NEUTROS ABS: 3.4 10*3/uL (ref 1.4–7.7)
Neutrophils Relative %: 52.6 % (ref 43.0–77.0)
PLATELETS: 290 10*3/uL (ref 150.0–400.0)
RBC: 4.18 Mil/uL — ABNORMAL LOW (ref 4.22–5.81)
RDW: 14.4 % (ref 11.5–15.5)
WBC: 6.4 10*3/uL (ref 4.0–10.5)

## 2014-12-31 LAB — LIPID PANEL
Cholesterol: 192 mg/dL (ref 0–200)
HDL: 37.2 mg/dL — ABNORMAL LOW (ref >39.00)
NonHDL: 154.52
Total CHOL/HDL Ratio: 5
Triglycerides: 338 mg/dL — ABNORMAL HIGH (ref 0.0–149.0)
VLDL: 67.6 mg/dL — ABNORMAL HIGH (ref 0.0–40.0)

## 2014-12-31 LAB — LDL CHOLESTEROL, DIRECT: LDL DIRECT: 76 mg/dL

## 2014-12-31 NOTE — Assessment & Plan Note (Signed)
Will recheck renal function.  

## 2014-12-31 NOTE — Addendum Note (Signed)
Addended by: Despina Hidden on: 12/31/2014 02:23 PM   Modules accepted: Orders

## 2014-12-31 NOTE — Assessment & Plan Note (Signed)
Recent check only 3.4cm No symptoms

## 2014-12-31 NOTE — Assessment & Plan Note (Signed)
Discussed trying to walk regularly Flu vaccine today Defer PSA Fecal immunoassay

## 2014-12-31 NOTE — Progress Notes (Signed)
Subjective:    Patient ID: [name], male    DOB: 08/01/1950, 64 y.o.   MRN: [id number]  HPI Here for physical  Has improved Pain basically resolved about 2 days ago Back to baseline now Finishing out the antibiotics Hasn't needed the hydrocodone  No chest pain while on the isosorbide No regular exercise-- does start some but then inconsistent  Now taking the atorvastatin regularly No myalgia or other problems with this  Current Outpatient Prescriptions on File Prior to Visit  Medication Sig Dispense Refill  . aspirin 81 MG tablet Take 81 mg by mouth daily.    Marland Kitchen atorvastatin (LIPITOR) 80 MG tablet Take 1 tablet (80 mg total) by mouth daily. 90 tablet 3  . carvedilol (COREG) 3.125 MG tablet TAKE 1 TABLET BY MOUTH TWICE A DAY 180 tablet 3  . EFFIENT 10 MG TABS tablet TAKE 1 TABLET BY MOUTH DAILY 30 each 3  . HYDROcodone-acetaminophen (NORCO/VICODIN) 5-325 MG per tablet Take 1-2 tablets by mouth every 6 (six) hours as needed for moderate pain. 30 tablet 0  . isosorbide mononitrate (IMDUR) 30 MG 24 hr tablet TAKE ONE TABLET BY MOUTH EVERY NIGHT AT BEDTIME 30 tablet 6  . lisinopril (PRINIVIL,ZESTRIL) 5 MG tablet TAKE 1 TABLET BY MOUTH DAILY 30 tablet 3  . mirtazapine (REMERON) 15 MG tablet TAKE 1 TABLET BY MOUTH EVERY NIGHT AT BEDTIME AS NEEDED. FOR SLEEP 90 tablet 1  . naproxen sodium (ALEVE) 220 MG tablet Take 220 mg by mouth daily as needed (pain).    . nitroGLYCERIN (NITROSTAT) 0.4 MG SL tablet Place 1 tablet (0.4 mg total) under the tongue every 5 (five) minutes as needed for chest pain. 25 tablet 6  . omeprazole (PRILOSEC OTC) 20 MG tablet Take 20 mg by mouth daily.     No current facility-administered medications on file prior to visit.    Allergies  Allergen Reactions  . Metoprolol Tartrate Hives and Itching    Past Medical History  Diagnosis Date  . CAD (coronary artery disease)     a. 1995 s/p CABG x 4: LIMA->LAD, VG->RI (known to be occluded), VG->AM->PDA;   b. 05/2002 Inf STEMI: VG->AM->PDA 100% treated w/ 2 BMS complicated by acute thrombosis req 3 BMS;  c. 07/2003 DES to  LAD & LCX (VG's to PDA & RI 100%);  d. 12/2010 Acute MI ([place]): DES to LCX & LM , LIMA ok,;  e.12/2011 Cath: LM/LCX stents ok , LIMA patent.  Marland Kitchen GERD (gastroesophageal reflux disease)   . Hyperlipidemia   . Hypertension   . Diverticulitis     1/06 Diverticulitis--CT of pelvis--diffuse sigmoid divertic  . AAA (abdominal aortic aneurysm)     a. Duplex 01/2012: stable infrarenal saccular AAA at 3.3cm x 3.2xm (f/u recommended 01/2013 per Dr. Rockey Situ)  . Renal artery stenosis     a. 03/2011 PTA and stenting of L RA. b. 01/2012 renal duplex - stable 1-59% R renal artery stenosis, normal L renal artery s/p stent.    Past Surgical History  Procedure Laterality Date  . Coronary artery bypass graft    . Closed reduction shoulder dislocation    . Angioplasty  1997  . Coronary stent placement  7/12    2 stents--Promus element plus (everolimus eluting)--[facility] in [place]  . Renal artery stent  03/2011 ?  Marland Kitchen Renal angiogram N/A 04/16/2011    Procedure: RENAL ANGIOGRAM;  Surgeon: Burnell Blanks, MD;  Location: [facility] CATH LAB;  Service: Cardiovascular;  Laterality: N/A;  .  Left heart catheterization with coronary/graft angiogram N/A 01/04/2012    Procedure: LEFT HEART CATHETERIZATION WITH Beatrix Fetters;  Surgeon: Burnell Blanks, MD;  Location: [facility] CATH LAB;  Service: Cardiovascular;  Laterality: N/A;  . Left heart catheterization with coronary/graft angiogram N/A 04/05/2014    Procedure: LEFT HEART CATHETERIZATION WITH Beatrix Fetters;  Surgeon: Jettie Booze, MD;  Location: [facility] CATH LAB;  Service: Cardiovascular;  Laterality: N/A;  . Percutaneous coronary stent intervention (pci-s)  04/05/2014    Procedure: PERCUTANEOUS CORONARY STENT INTERVENTION (PCI-S);  Surgeon: Jettie Booze, MD;  Location: [facility] CATH LAB;  Service: Cardiovascular;;  OM1    . Cardiac catheterization      8/09  Cath--vein graft occlusions which are old--no acute changes  . Cardiac catheterization  11/13/2010    stent x 2 @ [place]  . Cardiac catheterization  04/05/2014    stent placement   . Coronary angioplasty  04/05/2014    stent placement OM 1    Family History  Problem Relation Age of Onset  . Coronary artery disease Mother     Died MI age 70  . Hypertension Mother   . Heart attack Mother   . Coronary artery disease Sister     Living  . Coronary artery disease Brother     Living  . Diabetes Neg Hx   . Cancer Neg Hx     prostate or colon  . Coronary artery disease Father     Died MI age 4  . Heart attack Father   . Heart attack Maternal Grandfather     Social History   Social History  . Marital Status: Married    Spouse Name: N/A  . Number of Children: 3  . Years of Education: N/A   Occupational History  . Geneticist, molecular (Animal nutritionist)     own business now   Social History Main Topics  . Smoking status: Former Smoker -- 3.00 packs/day for 25 years    Types: Cigarettes    Quit date: 05/28/1993  . Smokeless tobacco: Never Used  . Alcohol Use: 8.4 oz/week    14 Cans of beer per week  . Drug Use: No  . Sexual Activity: Not on file   Other Topics Concern  . Not on file   Social History Narrative       Review of Systems  Constitutional: Negative for fatigue and unexpected weight change.       Wears seat belt  HENT: Positive for dental problem and hearing loss. Negative for tinnitus.        Wife feels his hearing is off. Teeth "are a mess"  Eyes: Negative for visual disturbance.       No diplopia or unilateral vision loss  Respiratory: Positive for shortness of breath. Negative for cough and chest tightness.        Notes DOE-- thinks it is related to past smoking  Cardiovascular: Negative for chest pain, palpitations and leg swelling.  Gastrointestinal: Negative for nausea and vomiting.       Trouble  eating--?related to teeth Appetite isn't great  Endocrine: Negative for polydipsia and polyuria.  Genitourinary: Negative for urgency, frequency and difficulty urinating.       No sex--no problem  Musculoskeletal: Positive for back pain.       Mild back pain Leg aching at night better with mirtazapine. Uses OTC leg cream recently--seems to help  Skin: Negative for rash.       No suspicious lesions  Allergic/Immunologic: Positive for environmental allergies. Negative for immunocompromised state.       No meds needed  Neurological: Positive for headaches. Negative for dizziness, syncope, weakness, light-headedness and numbness.       Rarely takes aleve for headache  Hematological: Negative for adenopathy. Bruises/bleeds easily.  Psychiatric/Behavioral: Negative for sleep disturbance and dysphoric mood. The patient is not nervous/anxious.        Objective:   Physical Exam  Constitutional: He is oriented to person, place, and time. He appears well-developed and well-nourished. No distress.  HENT:  Head: Normocephalic and atraumatic.  Right Ear: External ear normal.  Left Ear: External ear normal.  Mouth/Throat: Oropharynx is clear and moist. No oropharyngeal exudate.  Eyes: Conjunctivae and EOM are normal. Pupils are equal, round, and reactive to light.  Neck: Normal range of motion. Neck supple. No thyromegaly present.  Cardiovascular: Normal rate, regular rhythm and normal heart sounds.  Exam reveals no gallop.   No murmur heard. Faint or absent pedal pulses  Pulmonary/Chest: Effort normal. No respiratory distress. He has no wheezes. He has no rales.  Abdominal: Soft. There is no tenderness.  Musculoskeletal: He exhibits no edema or tenderness.  Lymphadenopathy:    He has no cervical adenopathy.  Neurological: He is alert and oriented to person, place, and time.  Skin: No rash noted. No erythema.  Scattered ecchymoses on arms mostly  Psychiatric: He has a normal mood and affect.  His behavior is normal.          Assessment & Plan:

## 2014-12-31 NOTE — Assessment & Plan Note (Signed)
Mostly leg aching Does well with the mirtazapine

## 2014-12-31 NOTE — Assessment & Plan Note (Signed)
No angina on the nitrate Still on effient Follows with cardiology

## 2014-12-31 NOTE — Progress Notes (Signed)
Pre visit review using our clinic review tool, if applicable. No additional management support is needed unless otherwise documented below in the visit note. 

## 2015-01-02 ENCOUNTER — Other Ambulatory Visit: Payer: Self-pay | Admitting: Cardiovascular Disease

## 2015-01-06 ENCOUNTER — Ambulatory Visit (INDEPENDENT_AMBULATORY_CARE_PROVIDER_SITE_OTHER): Payer: 59 | Admitting: Internal Medicine

## 2015-01-06 ENCOUNTER — Encounter: Payer: Self-pay | Admitting: Internal Medicine

## 2015-01-06 VITALS — BP 128/80 | HR 75 | Temp 98.3°F | Wt 156.0 lb

## 2015-01-06 DIAGNOSIS — K5792 Diverticulitis of intestine, part unspecified, without perforation or abscess without bleeding: Secondary | ICD-10-CM | POA: Insufficient documentation

## 2015-01-06 MED ORDER — AMOXICILLIN-POT CLAVULANATE 875-125 MG PO TABS: 1.0000 | ORAL_TABLET | Freq: Two times a day (BID) | ORAL | Status: DC

## 2015-01-06 NOTE — Assessment & Plan Note (Signed)
Improved but then recurred off the antibiotics No peritoneal signs---still able to eat and bowels moving May have had incompletely treated walled off abscess Will restart the augmentin---plan 3 weeks If not improved in 2 days--repeat CT scan

## 2015-01-06 NOTE — Progress Notes (Signed)
Subjective:    Patient ID: [name], male    DOB: [birth year], 64 y.o.   MRN: [id number]  HPI Here due to recurrent abdominal pain  His pain restarted 2 days ago "Miserable"---worse than it was before Feels more in the middle--similar to time in spring Some pain into back Feels better when lying down  No fever Some chills and feels "overheated at times" Unable to eat--drinking milkshakes, etc to keep up intake. Able to eat apple sauce and cottage cheese Bowels still moving--mostly liquid  Current Outpatient Prescriptions on File Prior to Visit  Medication Sig Dispense Refill  . aspirin 81 MG tablet Take 81 mg by mouth daily.    Marland Kitchen atorvastatin (LIPITOR) 80 MG tablet Take 1 tablet (80 mg total) by mouth daily. 90 tablet 3  . carvedilol (COREG) 3.125 MG tablet TAKE 1 TABLET BY MOUTH TWICE A DAY 180 tablet 3  . EFFIENT 10 MG TABS tablet TAKE 1 TABLET BY MOUTH DAILY 30 each 3  . HYDROcodone-acetaminophen (NORCO/VICODIN) 5-325 MG per tablet Take 1-2 tablets by mouth every 6 (six) hours as needed for moderate pain. 30 tablet 0  . isosorbide mononitrate (IMDUR) 30 MG 24 hr tablet TAKE ONE TABLET BY MOUTH EVERY NIGHT AT BEDTIME 30 tablet 6  . lisinopril (PRINIVIL,ZESTRIL) 5 MG tablet TAKE 1 TABLET BY MOUTH DAILY 30 tablet 6  . mirtazapine (REMERON) 15 MG tablet TAKE 1 TABLET BY MOUTH EVERY NIGHT AT BEDTIME AS NEEDED. FOR SLEEP 90 tablet 1  . naproxen sodium (ALEVE) 220 MG tablet Take 220 mg by mouth daily as needed (pain).    . nitroGLYCERIN (NITROSTAT) 0.4 MG SL tablet Place 1 tablet (0.4 mg total) under the tongue every 5 (five) minutes as needed for chest pain. 25 tablet 6  . omeprazole (PRILOSEC OTC) 20 MG tablet Take 20 mg by mouth daily.     No current facility-administered medications on file prior to visit.    Allergies  Allergen Reactions  . Metoprolol Tartrate Hives and Itching    Past Medical History  Diagnosis Date  . CAD (coronary artery disease)     a. 1995 s/p  CABG x 4: LIMA->LAD, VG->RI (known to be occluded), VG->AM->PDA;  b. 05/2002 Inf STEMI: VG->AM->PDA 100% treated w/ 2 BMS complicated by acute thrombosis req 3 BMS;  c. 07/2003 DES to  LAD & LCX (VG's to PDA & RI 100%);  d. 12/2010 Acute [place] ([place]): DES to LCX & LM , LIMA ok,;  e.12/2011 Cath: LM/LCX stents ok , LIMA patent.  Marland Kitchen GERD (gastroesophageal reflux disease)   . Hyperlipidemia   . Hypertension   . Diverticulitis     1/06 Diverticulitis--CT of pelvis--diffuse sigmoid divertic  . AAA (abdominal aortic aneurysm)     a. Duplex 01/2012: stable infrarenal saccular AAA at 3.3cm x 3.2xm (f/u recommended 01/2013 per Dr. Rockey Situ)  . Renal artery stenosis     a. 03/2011 PTA and stenting of L RA. b. 01/2012 renal duplex - stable 1-59% R renal artery stenosis, normal L renal artery s/p stent.    Past Surgical History  Procedure Laterality Date  . Coronary artery bypass graft    . Closed reduction shoulder dislocation    . Angioplasty  1997  . Coronary stent placement  7/12    2 stents--Promus element plus (everolimus eluting)--[facility] in [place]  . Renal artery stent  03/2011 ?  Marland Kitchen Renal angiogram N/A 04/16/2011    Procedure: RENAL ANGIOGRAM;  Surgeon: Burnell Blanks, MD;  Location: [place] CATH LAB;  Service: Cardiovascular;  Laterality: N/A;  . Left heart catheterization with coronary/graft angiogram N/A 01/04/2012    Procedure: LEFT HEART CATHETERIZATION WITH Beatrix Fetters;  Surgeon: Burnell Blanks, MD;  Location: [facility] CATH LAB;  Service: Cardiovascular;  Laterality: N/A;  . Left heart catheterization with coronary/graft angiogram N/A 04/05/2014    Procedure: LEFT HEART CATHETERIZATION WITH Beatrix Fetters;  Surgeon: Jettie Booze, MD;  Location: [facility] CATH LAB;  Service: Cardiovascular;  Laterality: N/A;  . Percutaneous coronary stent intervention (pci-s)  04/05/2014    Procedure: PERCUTANEOUS CORONARY STENT INTERVENTION (PCI-S);  Surgeon: Jettie Booze, MD;  Location: [facility] CATH LAB;  Service: Cardiovascular;;  OM1  . Cardiac catheterization      8/09  Cath--vein graft occlusions which are old--no acute changes  . Cardiac catheterization  11/13/2010    stent x 2 @ [place]  . Cardiac catheterization  04/05/2014    stent placement   . Coronary angioplasty  04/05/2014    stent placement OM 1    Family History  Problem Relation Age of Onset  . Coronary artery disease Mother     Died MI age 107  . Hypertension Mother   . Heart attack Mother   . Coronary artery disease Sister     Living  . Coronary artery disease Brother     Living  . Diabetes Neg Hx   . Cancer Neg Hx     prostate or colon  . Coronary artery disease Father     Died MI age 51  . Heart attack Father   . Heart attack Maternal Grandfather     Social History   Social History  . Marital Status: Married    Spouse Name: N/A  . Number of Children: 3  . Years of Education: N/A   Occupational History  . 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 Smoking status: Former Smoker -- 3.00 packs/day for 25 years    Types: Cigarettes    Quit date: 05/28/1993  . Smokeless tobacco: Never Used  . Alcohol Use: 8.4 oz/week    14 Cans of beer per week  . Drug Use: No  . Sexual Activity: Not on file   Other Topics Concern  . Not on file   Social History Narrative   Review of Systems Some cough first thing this morning--noticed lump in throat Gagged and it cleared out some Still some phlegm Nausea with cough this AM    Objective:   Physical Exam  Constitutional: He appears well-developed and well-nourished. No distress.  Uncomfortable moving around but no severe distress. Able to get up and down from table okay  Pulmonary/Chest: Effort normal and breath sounds normal. No respiratory distress. He has no wheezes. He has no rales.  Abdominal:  Decreased bowel sounds but present Mild to  moderate suprapubic and LLQ tenderness but no rebound or peritoneal signs          Assessment & Plan:

## 2015-01-06 NOTE — Patient Instructions (Signed)
Please call me if you are not better by Wednesday--I will set up a repeat CT scan. If you worsen considerably, have your wife bring you to the ER.

## 2015-01-06 NOTE — Progress Notes (Signed)
Pre visit review using our clinic review tool, if applicable. No additional management support is needed unless otherwise documented below in the visit note. 

## 2015-01-07 ENCOUNTER — Emergency Department (HOSPITAL_COMMUNITY): Payer: 59

## 2015-01-07 ENCOUNTER — Telehealth: Payer: Self-pay | Admitting: Internal Medicine

## 2015-01-07 ENCOUNTER — Encounter (HOSPITAL_COMMUNITY): Payer: Self-pay | Admitting: Emergency Medicine

## 2015-01-07 ENCOUNTER — Emergency Department (HOSPITAL_COMMUNITY)
Admission: EM | Admit: 2015-01-07 | Discharge: 2015-01-07 | Disposition: A | Discharge status: Home / Self Care | Payer: 59 | Attending: Emergency Medicine | Admitting: Emergency Medicine

## 2015-01-07 DIAGNOSIS — K219 Gastro-esophageal reflux disease without esophagitis: Secondary | ICD-10-CM | POA: Diagnosis not present

## 2015-01-07 DIAGNOSIS — K5733 Diverticulitis of large intestine without perforation or abscess with bleeding: Secondary | ICD-10-CM

## 2015-01-07 DIAGNOSIS — E785 Hyperlipidemia, unspecified: Secondary | ICD-10-CM | POA: Insufficient documentation

## 2015-01-07 DIAGNOSIS — Z7982 Long term (current) use of aspirin: Secondary | ICD-10-CM | POA: Diagnosis not present

## 2015-01-07 DIAGNOSIS — I10 Essential (primary) hypertension: Secondary | ICD-10-CM | POA: Insufficient documentation

## 2015-01-07 DIAGNOSIS — Z792 Long term (current) use of antibiotics: Secondary | ICD-10-CM | POA: Diagnosis not present

## 2015-01-07 DIAGNOSIS — Z951 Presence of aortocoronary bypass graft: Secondary | ICD-10-CM | POA: Insufficient documentation

## 2015-01-07 DIAGNOSIS — I2510 Atherosclerotic heart disease of native coronary artery without angina pectoris: Secondary | ICD-10-CM | POA: Insufficient documentation

## 2015-01-07 DIAGNOSIS — Z9861 Coronary angioplasty status: Secondary | ICD-10-CM | POA: Diagnosis not present

## 2015-01-07 DIAGNOSIS — R011 Cardiac murmur, unspecified: Secondary | ICD-10-CM | POA: Diagnosis not present

## 2015-01-07 DIAGNOSIS — K5732 Diverticulitis of large intestine without perforation or abscess without bleeding: Secondary | ICD-10-CM | POA: Diagnosis not present

## 2015-01-07 DIAGNOSIS — Z79899 Other long term (current) drug therapy: Secondary | ICD-10-CM | POA: Diagnosis not present

## 2015-01-07 DIAGNOSIS — Z87891 Personal history of nicotine dependence: Secondary | ICD-10-CM | POA: Insufficient documentation

## 2015-01-07 DIAGNOSIS — R109 Unspecified abdominal pain: Secondary | ICD-10-CM | POA: Diagnosis present

## 2015-01-07 DIAGNOSIS — R1032 Left lower quadrant pain: Secondary | ICD-10-CM

## 2015-01-07 LAB — COMPREHENSIVE METABOLIC PANEL
ALBUMIN: 3.4 g/dL — AB (ref 3.5–5.0)
ALK PHOS: 69 U/L (ref 38–126)
ALT: 18 U/L (ref 17–63)
ANION GAP: 9 (ref 5–15)
AST: 24 U/L (ref 15–41)
BILIRUBIN TOTAL: 0.8 mg/dL (ref 0.3–1.2)
BUN: <5 mg/dL — ABNORMAL LOW (ref 6–20)
CALCIUM: 8.9 mg/dL (ref 8.9–10.3)
CO2: 26 mmol/L (ref 22–32)
CREATININE: 0.78 mg/dL (ref 0.61–1.24)
Chloride: 98 mmol/L — ABNORMAL LOW (ref 101–111)
GFR calc non Af Amer: >60 mL/min (ref >60)
GLUCOSE: 110 mg/dL — AB (ref 65–99)
Potassium: 3.7 mmol/L (ref 3.5–5.1)
SODIUM: 133 mmol/L — AB (ref 135–145)
TOTAL PROTEIN: 7.1 g/dL (ref 6.5–8.1)

## 2015-01-07 LAB — CBC WITH DIFFERENTIAL/PLATELET
BASOS ABS: 0 10*3/uL (ref 0.0–0.1)
BASOS PCT: 1 % (ref 0–1)
EOS ABS: 0.3 10*3/uL (ref 0.0–0.7)
Eosinophils Relative: 3 % (ref 0–5)
HCT: 37.8 % — ABNORMAL LOW (ref 39.0–52.0)
Hemoglobin: 13.1 g/dL (ref 13.0–17.0)
Lymphocytes Relative: 22 % (ref 12–46)
Lymphs Abs: 1.9 10*3/uL (ref 0.7–4.0)
MCH: 32.4 pg (ref 26.0–34.0)
MCHC: 34.7 g/dL (ref 30.0–36.0)
MCV: 93.6 fL (ref 78.0–100.0)
MONO ABS: 0.9 10*3/uL (ref 0.1–1.0)
MONOS PCT: 10 % (ref 3–12)
Neutro Abs: 5.7 10*3/uL (ref 1.7–7.7)
Neutrophils Relative %: 64 % (ref 43–77)
PLATELETS: 251 10*3/uL (ref 150–400)
RBC: 4.04 MIL/uL — ABNORMAL LOW (ref 4.22–5.81)
RDW: 13.6 % (ref 11.5–15.5)
WBC: 8.8 10*3/uL (ref 4.0–10.5)

## 2015-01-07 LAB — I-STAT CHEM 8, ED
BUN: 4 mg/dL — ABNORMAL LOW (ref 6–20)
Calcium, Ion: 1.12 mmol/L — ABNORMAL LOW (ref 1.13–1.30)
Chloride: 97 mmol/L — ABNORMAL LOW (ref 101–111)
Creatinine, Ser: 0.7 mg/dL (ref 0.61–1.24)
GLUCOSE: 105 mg/dL — AB (ref 65–99)
HEMATOCRIT: 42 % (ref 39.0–52.0)
HEMOGLOBIN: 14.3 g/dL (ref 13.0–17.0)
POTASSIUM: 3.7 mmol/L (ref 3.5–5.1)
Sodium: 135 mmol/L (ref 135–145)
TCO2: 25 mmol/L (ref 0–100)

## 2015-01-07 LAB — I-STAT CG4 LACTIC ACID, ED: LACTIC ACID, VENOUS: 1.07 mmol/L (ref 0.5–2.0)

## 2015-01-07 MED ORDER — IOHEXOL 300 MG/ML  SOLN
25.0000 mL | Freq: Once | INTRAMUSCULAR | Status: AC | PRN
  Administered 2015-01-07: 25 mL via ORAL

## 2015-01-07 MED ORDER — CIPROFLOXACIN HCL 500 MG PO TABS
500.0000 mg | ORAL_TABLET | Freq: Once | ORAL | Status: AC
  Administered 2015-01-07: 500 mg via ORAL
  Filled 2015-01-07: qty 1

## 2015-01-07 MED ORDER — CIPROFLOXACIN HCL 500 MG PO TABS: 500.0000 mg | ORAL_TABLET | Freq: Two times a day (BID) | ORAL | Status: DC

## 2015-01-07 MED ORDER — METRONIDAZOLE 500 MG PO TABS
500.0000 mg | ORAL_TABLET | Freq: Once | ORAL | Status: DC
  Filled 2015-01-07: qty 1

## 2015-01-07 MED ORDER — MORPHINE SULFATE (PF) 2 MG/ML IV SOLN
2.0000 mg | INTRAVENOUS | Status: DC | PRN
Start: 2015-01-07 — End: 2015-01-07
  Administered 2015-01-07 (×2): 2 mg via INTRAVENOUS
  Filled 2015-01-07 (×2): qty 1

## 2015-01-07 MED ORDER — MOXIFLOXACIN HCL 400 MG PO TABS: 400.0000 mg | ORAL_TABLET | Freq: Every day | ORAL | Status: DC

## 2015-01-07 MED ORDER — METRONIDAZOLE 500 MG PO TABS: 500.0000 mg | ORAL_TABLET | Freq: Three times a day (TID) | ORAL | Status: DC

## 2015-01-07 MED ORDER — IOHEXOL 300 MG/ML  SOLN
75.0000 mL | Freq: Once | INTRAMUSCULAR | Status: AC | PRN
  Administered 2015-01-07: 75 mL via INTRAVENOUS

## 2015-01-07 NOTE — ED Provider Notes (Signed)
CSN: [id number]     Arrival date & time 01/07/15  1014 History   First MD Initiated Contact with Patient 01/07/15 1131     Chief Complaint  Patient presents with  . Diverticulitis    HPI Joseangel Nettleton is a 64 y.o. male presenting to the ED with acute flair of his diverticular disease. Patient states that about 2 weeks ago he had beginning symptoms of diverticulitis. He went to his PCPs office and was given a prescription for amoxicillin. He was told to follow-up if symptoms not improved. Patient states that about a week later he did feel improvement after antibodies. However, couple days later pain started again. He re-presented to his PCPs office and was given a 3 week course of amoxicillin with return precautions to the ED or office if symptoms worsen.  Patient presents to the ED due to severe 10 out of 10 pain that is intermittent. He states that he has no appetite. Denies any nausea. Has had diarrhea and bloating. Denies any fevers. She states that he has had 6-7 bouts of diverticulitis without known complications. States that pain is moving usually is LLQ abut now going more medially.    Past Medical History  Diagnosis Date  . CAD (coronary artery disease)     a. 1995 s/p CABG x 4: LIMA->LAD, VG->RI (known to be occluded), VG->AM->PDA;  b. 05/2002 Inf STEMI: VG->AM->PDA 100% treated w/ 2 BMS complicated by acute thrombosis req 3 BMS;  c. 07/2003 DES to  LAD & LCX (VG's to PDA & RI 100%);  d. 12/2010 Acute MI ([place]): DES to LCX & LM , LIMA ok,;  e.12/2011 Cath: LM/LCX stents ok , LIMA patent.  Marland Kitchen GERD (gastroesophageal reflux disease)   . Hyperlipidemia   . Hypertension   . Diverticulitis     1/06 Diverticulitis--CT of pelvis--diffuse sigmoid divertic  . AAA (abdominal aortic aneurysm)     a. Duplex 01/2012: stable infrarenal saccular AAA at 3.3cm x 3.2xm (f/u recommended 01/2013 per Dr. Rockey Situ)  . Renal artery stenosis     a. 03/2011 PTA and stenting of L RA. b. 01/2012 renal duplex - stable  1-59% R renal artery stenosis, normal L renal artery s/p stent.   Past Surgical History  Procedure Laterality Date  . Coronary artery bypass graft    . Closed reduction shoulder dislocation    . Angioplasty  1997  . Coronary stent placement  7/12    2 stents--Promus element plus (everolimus eluting)--[facility] in [place]  . Renal artery stent  03/2011 ?  Marland Kitchen Renal angiogram N/A 04/16/2011    Procedure: RENAL ANGIOGRAM;  Surgeon: Burnell Blanks, MD;  Location: [facility] CATH LAB;  Service: Cardiovascular;  Laterality: N/A;  . Left heart catheterization with coronary/graft angiogram N/A 01/04/2012    Procedure: LEFT HEART CATHETERIZATION WITH Beatrix Fetters;  Surgeon: Burnell Blanks, MD;  Location: [facility] CATH LAB;  Service: Cardiovascular;  Laterality: N/A;  . Left heart catheterization with coronary/graft angiogram N/A 04/05/2014    Procedure: LEFT HEART CATHETERIZATION WITH Beatrix Fetters;  Surgeon: Jettie Booze, MD;  Location: [facility] CATH LAB;  Service: Cardiovascular;  Laterality: N/A;  . Percutaneous coronary stent intervention (pci-s)  04/05/2014    Procedure: PERCUTANEOUS CORONARY STENT INTERVENTION (PCI-S);  Surgeon: Jettie Booze, MD;  Location: [facility] CATH LAB;  Service: Cardiovascular;;  OM1  . Cardiac catheterization      8/09  Cath--vein graft occlusions which are old--no acute changes  . Cardiac catheterization  11/13/2010  stent x 2 @ [place]  . Cardiac catheterization  04/05/2014    stent placement   . Coronary angioplasty  04/05/2014    stent placement OM 1   Family History  Problem Relation Age of Onset  . Coronary artery disease Mother     Died MI age 19  . Hypertension Mother   . Heart attack Mother   . Coronary artery disease Sister     Living  . Coronary artery disease Brother     Living  . Diabetes Neg Hx   . Cancer Neg Hx     prostate or colon  . Coronary artery disease Father     Died MI age 83  . Heart attack  Father   . Heart attack Maternal Grandfather    Social History  Substance Use Topics  . Smoking status: Former Smoker -- 3.00 packs/day for 25 years    Types: Cigarettes    Quit date: 05/28/1993  . Smokeless tobacco: Never Used  . Alcohol Use: 8.4 oz/week    14 Cans of beer per week    Review of Systems  Constitutional: Negative for fever and chills.  Respiratory: Negative for shortness of breath.   Cardiovascular: Negative for chest pain.  Gastrointestinal: Positive for abdominal pain, diarrhea and abdominal distention.  Also per HPI  Allergies  Metoprolol tartrate  Home Medications   Prior to Admission medications   Medication Sig Start Date End Date Taking? Authorizing Provider  amoxicillin-clavulanate (AUGMENTIN) 875-125 MG per tablet Take 1 tablet by mouth 2 (two) times daily. 01/06/15  Yes Venia Carbon, MD  aspirin 81 MG tablet Take 81 mg by mouth daily.   Yes Historical Provider, MD  atorvastatin (LIPITOR) 80 MG tablet Take 1 tablet (80 mg total) by mouth daily. 05/06/14  Yes Minna Merritts, MD  carvedilol (COREG) 3.125 MG tablet TAKE 1 TABLET BY MOUTH TWICE A DAY 02/26/14  Yes Venia Carbon, MD  EFFIENT 10 MG TABS tablet TAKE 1 TABLET BY MOUTH DAILY 10/15/14  Yes Minna Merritts, MD  HYDROcodone-acetaminophen (NORCO/VICODIN) 5-325 MG per tablet Take 1-2 tablets by mouth every 6 (six) hours as needed for moderate pain. 08/07/14  Yes Pleas Koch, NP  isosorbide mononitrate (IMDUR) 30 MG 24 hr tablet TAKE ONE TABLET BY MOUTH EVERY NIGHT AT BEDTIME 09/30/14  Yes Minna Merritts, MD  lisinopril (PRINIVIL,ZESTRIL) 5 MG tablet TAKE 1 TABLET BY MOUTH DAILY 01/02/15  Yes Minna Merritts, MD  mirtazapine (REMERON) 15 MG tablet TAKE 1 TABLET BY MOUTH EVERY NIGHT AT BEDTIME AS NEEDED. FOR SLEEP 07/22/14  Yes Venia Carbon, MD  naproxen sodium (ALEVE) 220 MG tablet Take 220 mg by mouth daily as needed (pain).   Yes Historical Provider, MD  omeprazole (PRILOSEC OTC) 20 MG  tablet Take 20 mg by mouth daily.   Yes Historical Provider, MD  nitroGLYCERIN (NITROSTAT) 0.4 MG SL tablet Place 1 tablet (0.4 mg total) under the tongue every 5 (five) minutes as needed for chest pain. 05/06/14   Minna Merritts, MD   BP 104/78 mmHg  Pulse 64  Temp(Src) 97.3 F (36.3 C) (Oral)  Resp 16  SpO2 98% Physical Exam  Constitutional: He is oriented to person, place, and time. He appears well-developed and well-nourished. No distress.  Cardiovascular: Normal rate, regular rhythm and intact distal pulses.   Murmur heard. Pulmonary/Chest: Effort normal.  Abdominal: Soft. He exhibits distension. Bowel sounds are increased. There is tenderness in the suprapubic area  and left lower quadrant.  Musculoskeletal: He exhibits no edema.  Neurological: He is alert and oriented to person, place, and time.  Skin: Skin is warm and dry.  Psychiatric: He has a normal mood and affect.   ED Course  Procedures (including critical care time) Labs Review Labs Reviewed  COMPREHENSIVE METABOLIC PANEL - Abnormal; Notable for the following:    Sodium 133 (*)    Chloride 98 (*)    Glucose, Bld 110 (*)    BUN <5 (*)    Albumin 3.4 (*)    All other components within normal limits  CBC WITH DIFFERENTIAL/PLATELET - Abnormal; Notable for the following:    RBC 4.04 (*)    HCT 37.8 (*)    All other components within normal limits  I-STAT CHEM 8, ED - Abnormal; Notable for the following:    Chloride 97 (*)    BUN 4 (*)    Glucose, Bld 105 (*)    Calcium, Ion 1.12 (*)    All other components within normal limits  I-STAT CG4 LACTIC ACID, ED    Imaging Review No results found. I have personally reviewed and evaluated these images and lab results as part of my medical decision-making.   EKG Interpretation None      MDM   Final diagnoses:  Left lower quadrant pain   Patient present to ED with LLQ pain 2/2 diverticulitis. Concern for abscess, perforation, or other concerning complications.  Morphine given for pain management. CT abdomen ordered and pending. Lactic acid normal. Vital signs stable. Labs overall unremarkable.    Shift ended so transferring care to on-coming team to follow-up on patient.    Luiz Blare, DO 01/07/2015, 2:59 PM PGY-2, [place], DO 01/07/15 [place]  Leo Grosser, MD 01/07/15 [phone]

## 2015-01-07 NOTE — ED Notes (Signed)
Pt placed in gown and in bed. Pt monitored by pulse ox and bp cuff. No 5-lead per [employer].

## 2015-01-07 NOTE — Discharge Instructions (Signed)
Diverticulitis °Diverticulitis is when small pockets that have formed in your colon (large intestine) become infected or swollen. °HOME CARE °· Follow your doctor's instructions. °· Follow a special diet if told by your doctor. °· When you feel better, your doctor may tell you to change your diet. You may be told to eat a lot of fiber. Fruits and vegetables are good sources of fiber. Fiber makes it easier to poop (have bowel movements). °· Take supplements or probiotics as told by your doctor. °· Only take medicines as told by your doctor. °· Keep all follow-up visits with your doctor. °GET HELP IF: °· Your pain does not get better. °· You have a hard time eating food. °· You are not pooping like normal. °GET HELP RIGHT AWAY IF: °· Your pain gets worse. °· Your problems do not get better. °· Your problems suddenly get worse. °· You have a fever. °· You keep throwing up (vomiting). °· You have bloody or black, tarry poop (stool). °MAKE SURE YOU:  °· Understand these instructions. °· Will watch your condition. °· Will get help right away if you are not doing well or get worse. °Document Released: 09/29/2007 Document Revised: 04/17/2013 Document Reviewed: 03/07/2013 °ExitCare® Patient Information ©2015 [employer]. This information is not intended to replace advice given to you by your health care provider. Make sure you discuss any questions you have with your health care provider. ° °

## 2015-01-07 NOTE — Telephone Encounter (Signed)
Now in ER I will follow up with him after I see the results of his work up

## 2015-01-07 NOTE — ED Provider Notes (Addendum)
Patient care assumed from Dr. Luiz Blare at 4:00pm. Please refer to her note for full details of patient history and physical.  Known past medical history of diverticulitis. Recent symptoms of nausea and diarrhea with abdominal pain and treated empirically for diverticulitis outpatient with Amoxicillin. Worsening of symptoms despite antibiotics. WBC 8.8. Lactic acid 1.07. Concern for abscess vs perforation. CT abdomen and pelvis showing mild focal acute diverticulitis or colitis in the distal left colon but no evidence of abscess or perforation.  Patient discharged home in stable condition initially with a prescription for Ciprofloxacin and Flagyl - unable to tolerate Flagyl however and so patient was given alternative diverticulitis treatment with Moxifloxacin. Strict ED return precautions discussed. Patient stands agrees with plan has no further questions or concerns time.  Pt care discussed with and followed by my attending, Dr. Carita Pian, MD 01/08/15 [place], DO 01/08/15 [phone]

## 2015-01-07 NOTE — Telephone Encounter (Signed)
[place]  Patient Name: [name]  DOB: 04-16-1951    Initial Comment Caller states husband was seen a couple of weeks ago for diverticulitis, back yesterday after completion of abx. This morning pain worse, holding belly.   Nurse Assessment  Nurse: Wynetta Emery, RN, Baker Janus Date/Time Eilene Ghazi Time): 01/07/2015 9:07:52 AM  Confirm and document reason for call. If symptomatic, describe symptoms. ---Alvester Chou finished antibiotics for diverticulits refilled on Saturday and saw MD yesterday since he was feeling worse --given new medication and advised to call back if getting worse or take to ED got better the first time and up pacing stomach hurting holding stomach taking new antibiotic  Has the patient traveled out of the country within the last 30 days? ---No  Does the patient require triage? ---Yes  Related visit to physician within the last 2 weeks? ---Yes  Does the PT have any chronic conditions? (i.e. diabetes, asthma, etc.) ---Yes  List chronic conditions. ---Diverticulitis     Guidelines    Guideline Title Affirmed Question Affirmed Notes  Abdominal Pain - Male [1] SEVERE pain AND [2] age > 90    Final Disposition User   Go to ED Now Wynetta Emery, RN, Baker Janus    Comments  ATTN: please let Dr. Silvio Pate know patient was sent to ED d/t to severity of abdominal pain --can't stand up straight, pacing rates it 8 worse before first bout he states [facility] ED   Referrals  [facility] - ED   Disagree/Comply: Comply

## 2015-01-07 NOTE — ED Provider Notes (Signed)
I saw and evaluated the patient, reviewed the resident's note and I agree with the findings and plan. Please see associated encounter note.   EKG Interpretation None      64 year old male with history of prior multiple episodes of diverticulitis presents to ongoing left lower quadrant pain. He is tender to palpation over the left lower quadrant and suprapubic area on exam. Possible abscess or other, location of recurrent diverticulitis. Plan to CT scan and disposition accordingly.  Leo Grosser, MD 01/07/15 [phone]

## 2015-01-07 NOTE — ED Notes (Addendum)
Pt reports he has diverticulitis and has just finished the first course of antibiotics; now on 3 weeks worth and was told by GI to come here if not getting better. Pain in lower abdomen both quadrants. States has had 6 flare ups that are usually tx with antibiotics by MD. Chronic nausea and diarr. Has AAA but is monitored yearly with no change.

## 2015-01-08 ENCOUNTER — Telehealth: Payer: Self-pay | Admitting: *Deleted

## 2015-01-08 NOTE — Telephone Encounter (Signed)
Spoke with patient and he's doing better after ED visit

## 2015-02-10 ENCOUNTER — Encounter: Payer: Self-pay | Admitting: Cardiovascular Disease

## 2015-02-10 ENCOUNTER — Ambulatory Visit (INDEPENDENT_AMBULATORY_CARE_PROVIDER_SITE_OTHER): Payer: 59 | Admitting: Cardiovascular Disease

## 2015-02-10 VITALS — BP 140/88 | HR 68 | Ht 65.0 in | Wt 157.0 lb

## 2015-02-10 DIAGNOSIS — I714 Abdominal aortic aneurysm, without rupture, unspecified: Secondary | ICD-10-CM

## 2015-02-10 DIAGNOSIS — R0602 Shortness of breath: Secondary | ICD-10-CM

## 2015-02-10 DIAGNOSIS — I25119 Atherosclerotic heart disease of native coronary artery with unspecified angina pectoris: Secondary | ICD-10-CM | POA: Diagnosis not present

## 2015-02-10 DIAGNOSIS — E785 Hyperlipidemia, unspecified: Secondary | ICD-10-CM

## 2015-02-10 DIAGNOSIS — I701 Atherosclerosis of renal artery: Secondary | ICD-10-CM | POA: Diagnosis not present

## 2015-02-10 DIAGNOSIS — I25700 Atherosclerosis of coronary artery bypass graft(s), unspecified, with unstable angina pectoris: Secondary | ICD-10-CM

## 2015-02-10 MED ORDER — ROSUVASTATIN CALCIUM 40 MG PO TABS: 40.0000 mg | ORAL_TABLET | Freq: Every day | ORAL | Status: DC

## 2015-02-10 MED ORDER — EZETIMIBE 10 MG PO TABS: 10.0000 mg | ORAL_TABLET | Freq: Every day | ORAL | Status: DC

## 2015-02-10 NOTE — Assessment & Plan Note (Signed)
Stable at this time, no symptoms concerning for unstable angina High risk given his hyperlipidemia Anginal symptoms discussed with him

## 2015-02-10 NOTE — Assessment & Plan Note (Signed)
Stable 3.2 cm AAA. Will likely need periodic evaluation  Consider repeat ultrasound next year

## 2015-02-10 NOTE — Assessment & Plan Note (Signed)
Currently denies any symptoms concerning for angina.

## 2015-02-10 NOTE — Progress Notes (Signed)
Patient ID: [name], male    DOB: 10-30-1950, 64 y.o.   MRN: [id number]  HPI Comments: [name] is a 64 year old gentleman with coronary artery disease, bypass surgery in 1995, stenting at [facility] in February 2004 for MI with a 3.0 x 25 mm and 4.5 x 16 mm Monorail ( location uncertain), also 4.5 x 18 mm stent placed to the mid RCA, followup with repeat stenting in April 2005 to the proximal LAD with a Taxus stent 2.5 x 8 mm, and stenting to the proximal left circumflex with a 2.5 x 12 mm Taxus, with recent stent placed November 13, 2010 with a 4.0 x 9 mm stent placed to the left main. He presents for routine followup of his coronary artery disease. History of renal artery stent. Abdominal aortic aneurysm 3.2 cm Prior smoking history  In follow-up today, he reports that he is doing well, severe problems with his teeth Reports that he needs to have most of his teeth pulled out and have dentures He would like to stop the effient. DES placed in December 2015. We have previously requested that he wait until around that time in 2016 Reports blood pressure has been well-controlled, otherwise no changes. Tolerating Lipitor, cholesterol close to goal, mildly high  EKG on today's visit shows normal sinus rhythm with rate 68 bpm, no significant ST or T-wave changes  Other past medical history  chest pain 04/05/2014, went to [facility] head catheterization. This showed an occluded OM vessel with DES placed 2 Occluded mid LAD with a patent LIMA to the LAD, patent left circumflex, prior bare-metal stent into the OM1 was occluded in the distal portion just after the ostium of OM1. Occluded RCA, ejection fraction 35%  Previous episode of chest pain while in [place] with hospitalization at that time, negative stress test, 2013 recurrent chest pain requiring admission to [facility], repeat catheterization performed 12/2011 This showed patent stents to the left circumflex and LAD with patent  LIMA to the LAD. Vein graft to the OM and vein graft to the RCA was occluded  admission to [facility] at the beginning of July 2014. Notes from the hospital were reviewed. Ruled out for MI with negative cardiac enzymes, CT scan chest showing no PE. He had stress test 10/25/2012 showing old inferior wall MI with defect noted in the basal to mid inferior wall. Discharged on tramadol.   Ultrasound of his aorta in [place] 12/22/2011 showed distal aorta 3 cm, iliacs of normal size Previous lower extremity ultrasound  showed no significant disease  He used to be a smoker though stopped in 1995. Prior to that he smoked 3 packs per day. Smoked for approximately 25-28 years        Allergies  Allergen Reactions  . Metoprolol Tartrate Hives and Itching    Outpatient Encounter Prescriptions as of 02/10/2015  Medication Sig  . aspirin 81 MG tablet Take 81 mg by mouth daily.  Marland Kitchen atorvastatin (LIPITOR) 80 MG tablet Take 1 tablet (80 mg total) by mouth daily.  . carvedilol (COREG) 3.125 MG tablet TAKE 1 TABLET BY MOUTH TWICE A DAY  . EFFIENT 10 MG TABS tablet TAKE 1 TABLET BY MOUTH DAILY  . isosorbide mononitrate (IMDUR) 30 MG 24 hr tablet TAKE ONE TABLET BY MOUTH EVERY NIGHT AT BEDTIME  . lisinopril (PRINIVIL,ZESTRIL) 5 MG tablet TAKE 1 TABLET BY MOUTH DAILY  . mirtazapine (REMERON) 15 MG tablet TAKE 1 TABLET BY MOUTH EVERY NIGHT AT BEDTIME AS NEEDED. FOR  SLEEP  . naproxen sodium (ALEVE) 220 MG tablet Take 220 mg by mouth daily as needed (pain).  . nitroGLYCERIN (NITROSTAT) 0.4 MG SL tablet Place 1 tablet (0.4 mg total) under the tongue every 5 (five) minutes as needed for chest pain.  Marland Kitchen omeprazole (PRILOSEC OTC) 20 MG tablet Take 20 mg by mouth daily.  Marland Kitchen ezetimibe (ZETIA) 10 MG tablet Take 1 tablet (10 mg total) by mouth daily.  . rosuvastatin (CRESTOR) 40 MG tablet Take 1 tablet (40 mg total) by mouth daily.  . [DISCONTINUED] amoxicillin-clavulanate (AUGMENTIN) 875-125 MG per tablet Take 1  tablet by mouth 2 (two) times daily. (Patient not taking: Reported on 02/10/2015)  . [DISCONTINUED] HYDROcodone-acetaminophen (NORCO/VICODIN) 5-325 MG per tablet Take 1-2 tablets by mouth every 6 (six) hours as needed for moderate pain. (Patient not taking: Reported on 02/10/2015)  . [DISCONTINUED] moxifloxacin (AVELOX) 400 MG tablet Take 1 tablet (400 mg total) by mouth daily at 8 pm. (Patient not taking: Reported on 02/10/2015)   No facility-administered encounter medications on file as of 02/10/2015.    Past Medical History  Diagnosis Date  . CAD (coronary artery disease)     a. 1995 s/p CABG x 4: LIMA->LAD, VG->RI (known to be occluded), VG->AM->PDA;  b. 05/2002 Inf STEMI: VG->AM->PDA 100% treated w/ 2 BMS complicated by acute thrombosis req 3 BMS;  c. 07/2003 DES to  LAD & LCX (VG's to PDA & RI 100%);  d. 12/2010 Acute MI ([place]): DES to LCX & LM , LIMA ok,;  e.12/2011 Cath: LM/LCX stents ok , LIMA patent.  Marland Kitchen GERD (gastroesophageal reflux disease)   . Hyperlipidemia   . Hypertension   . Diverticulitis     1/06 Diverticulitis--CT of pelvis--diffuse sigmoid divertic  . AAA (abdominal aortic aneurysm) ([place])     a. Duplex 01/2012: stable infrarenal saccular AAA at 3.3cm x 3.2xm (f/u recommended 01/2013 per Dr. Rockey Situ)  . Renal artery stenosis ([place])     a. 03/2011 PTA and stenting of L RA. b. 01/2012 renal duplex - stable 1-59% R renal artery stenosis, normal L renal artery s/p stent.    Past Surgical History  Procedure Laterality Date  . Coronary artery bypass graft    . Closed reduction shoulder dislocation    . Angioplasty  1997  . Coronary stent placement  7/12    2 stents--Promus element plus (everolimus eluting)--[facility] in [place]  . Renal artery stent  03/2011 ?  Marland Kitchen Renal angiogram N/A 04/16/2011    Procedure: RENAL ANGIOGRAM;  Surgeon: Burnell Blanks, MD;  Location: [facility] CATH LAB;  Service: Cardiovascular;  Laterality: N/A;  . Left heart catheterization with  coronary/graft angiogram N/A 01/04/2012    Procedure: LEFT HEART CATHETERIZATION WITH Beatrix Fetters;  Surgeon: Burnell Blanks, MD;  Location: [facility] CATH LAB;  Service: Cardiovascular;  Laterality: N/A;  . Left heart catheterization with coronary/graft angiogram N/A 04/05/2014    Procedure: LEFT HEART CATHETERIZATION WITH Beatrix Fetters;  Surgeon: Jettie Booze, MD;  Location: [facility] CATH LAB;  Service: Cardiovascular;  Laterality: N/A;  . Percutaneous coronary stent intervention (pci-s)  04/05/2014    Procedure: PERCUTANEOUS CORONARY STENT INTERVENTION (PCI-S);  Surgeon: Jettie Booze, MD;  Location: [facility] CATH LAB;  Service: Cardiovascular;;  OM1  . Cardiac catheterization      8/09  Cath--vein graft occlusions which are old--no acute changes  . Cardiac catheterization  11/13/2010    stent x 2 @ [place]  . Cardiac catheterization  04/05/2014    stent  placement   . Coronary angioplasty  04/05/2014    stent placement OM 1    Social History  reports that he quit smoking about 21 years ago. His smoking use included Cigarettes. He has a 75 pack-year smoking history. He has never used smokeless tobacco. He reports that he drinks about 8.4 oz of alcohol per week. He reports that he does not use illicit drugs.  Family History family history includes Coronary artery disease in his brother, father, mother, and sister; Heart attack in his father, maternal grandfather, and mother; Hypertension in his mother. There is no history of Diabetes or Cancer.   Review of Systems  Constitutional: Negative.   Respiratory: Negative.   Cardiovascular: Negative.   Gastrointestinal: Negative.   Musculoskeletal: Positive for back pain and gait problem.       Bilateral leg pain  Skin: Negative.   Neurological: Negative.   Hematological: Negative.   Psychiatric/Behavioral: Negative.   All other systems reviewed and are negative.   BP 140/88 mmHg  Pulse 68  Ht 5\' 5"  (1.651 m)   Wt 157 lb (71.215 kg)  BMI 26.13 kg/m2  Physical Exam  Constitutional: He is oriented to person, place, and time. He appears well-developed and well-nourished.  HENT:  Head: Normocephalic.  Nose: Nose normal.  Mouth/Throat: Oropharynx is clear and moist.  Eyes: Conjunctivae are normal. Pupils are equal, round, and reactive to light.  Neck: Normal range of motion. Neck supple. No JVD present.  Cardiovascular: Normal rate, regular rhythm, S1 normal, S2 normal, normal heart sounds and intact distal pulses.  Exam reveals no gallop and no friction rub.   No murmur heard. Pulmonary/Chest: Effort normal and breath sounds normal. No respiratory distress. He has no wheezes. He has no rales. He exhibits no tenderness.  Abdominal: Soft. Bowel sounds are normal. He exhibits no distension. There is no tenderness.  Musculoskeletal: Normal range of motion. He exhibits no edema or tenderness.  Lymphadenopathy:    He has no cervical adenopathy.  Neurological: He is alert and oriented to person, place, and time. Coordination normal.  Skin: Skin is warm and dry. No rash noted. No erythema.  Psychiatric: He has a normal mood and affect. His behavior is normal. Judgment and thought content normal.      Assessment and Plan   Nursing note and vitals reviewed.

## 2015-02-10 NOTE — Assessment & Plan Note (Signed)
Patent stent on renal artery ultrasound Repeat in one year

## 2015-02-10 NOTE — Assessment & Plan Note (Signed)
Recommended he hold his Lipitor, changed to Crestor 40 mg daily to reach goal If needed, zetia could be added when this goes generic in March 2017

## 2015-02-10 NOTE — Patient Instructions (Addendum)
You are doing well.  Please change lipitor to crestor to 40 mg daily In march, we can add zetia one a day  Ok to hold effient for 4 to 5 days before the dental procedure come 03/2015  We will schedule your renal artery u/s and aaa u/s in December  Please call us if you have new issues that need to be addressed before your next appt.  Your physician wants you to follow-up in: 6 months.  You will receive a reminder letter in the mail two months in advance. If you don't receive a letter, please call our office to schedule the follow-up appointment.    Renal Artery Stenosis Renal artery stenosis (RAS) is narrowing of the artery that carries blood to your kidneys. It can affect one or both kidneys. Your kidneys filter waste and extra fluid from your blood. You get rid of the waste and fluid when you urinate. Your kidneys also make an important chemical messenger (hormone) called renin. Renin helps regulate your blood pressure. The first sign of RAS may be high blood pressure. Over time, other symptoms can develop. CAUSES  Plaque buildup in your arteries (atherosclerosis) is the main cause of RAS. The plaques that cause this are made up of:  Fat.  Cholesterol.  Calcium.  Other substances. As these substances build up in your renal artery, this slows the blood supply to your kidneys. The lack of blood and oxygen causes the signs and symptoms of RAS. A much less common cause of RAS is a disease called fibromuscular dysplasia. This disease causes abnormal cell growth that narrows the renal artery. It is not related to atherosclerosis. It occurs mostly in women who are 42-19 years old. It may be passed down through families. RISK FACTORS  You may be at risk for renal artery stenosis if you:  Are a man who is at least 64 years old.  Are a woman who is at least 64 years old.  Have high blood pressure.  Have high cholesterol.  Are a smoker.  Abuse alcohol.  Have diabetes or  prediabetes.  Are overweight.  Have a family history of early heart disease. SIGNS AND SYMPTOMS  RAS usually develops slowly. You may not have any signs or symptoms at first. The earliest signs may be:  Developing high blood pressure.  A sudden increase in existing high blood pressure.  No longer responding to medicine that used to control your blood pressure. Later signs and symptoms are due to kidney damage. They may include:  Fatigue.  Shortness of breath.  Swollen legs and feet.  Dry skin.  Headaches.  Muscle cramps.  Loss of appetite.  Nausea or vomiting. DIAGNOSIS  Your health care provider may suspect RAS based on changes in your blood pressure and your risk factors. A physical exam will be done. Your health care provider may use a stethoscope to listen for a whooshing sound (bruit) that can occur where the renal artery is blocking blood flow. Several tests may be done to confirm a diagnosis of RAS. These may include:  Blood and urine tests to check your kidney function.  Imaging tests of your kidneys, such as:  A test that involves using sound waves to create an image of your kidneys and the blood flow to your kidneys (ultrasound).  A test in which dye is injected into one of your blood vessels so images can be taken as the dye flows through your renal arteries (angiogram). These tests can be done using  X-rays, a CT scan (computed tomography angiogram, CTA), or a type of MRI (magnetic resonance angiogram, MRA). TREATMENT  Making lifestyle changes to reduce your risk factors is the first treatment option for early RAS. If the blood flow to one of your kidneys is cut by more than half, you may need medicine to:  Lower your blood pressure. This is the main medical treatment for RAS. You may need more than one type of medicine for this. The two types that work best for RAS are:  ACE inhibitors.  Angiotensin receptor blockers.  Reduce fluid in the body  (diuretics).  Lower your cholesterol (statins). If medicine is not enough to control RAS, you may need surgery. This may involve:  Threading a tube with an inflatable balloon into the renal artery to force it open (angioplasty).  Removing plaque from inside the artery (endarterectomy). HOME CARE INSTRUCTIONS  Take medicines only as directed by your health care provider.  Make any lifestyle changes recommended by your health care provider. This may include:  Working with a dietitian to maintain a heart-healthy diet. This type of diet is low in saturated fat, salt, and added sugar.  Starting an exercise program as directed by your health care provider.  Maintaining a healthy weight.  Quitting smoking.  Not abusing alcohol.  Keep all follow-up visits as directed by your health care provider. This is important. SEEK MEDICAL CARE IF:  Your symptoms of RAS are not getting better.  Your symptoms are changing or getting worse. SEEK IMMEDIATE MEDICAL CARE IF:  You have very bad pain in your back or abdomen.  You have blood in your urine.   This information is not intended to replace advice given to you by your health care provider. Make sure you discuss any questions you have with your health care provider.   Document Released: 01/06/2005 Document Revised: 05/03/2014 Document Reviewed: 07/26/2013 Elsevier Interactive Patient Education 2016 Elsevier Inc. Abdominal Aortic Aneurysm An aneurysm is a weakened or damaged part of an artery wall that bulges from the normal force of blood pumping through the body. An abdominal aortic aneurysm is an aneurysm that occurs in the lower part of the aorta, the main artery of the body.  The major concern with an abdominal aortic aneurysm is that it can enlarge and burst (rupture) or blood can flow between the layers of the wall of the aorta through a tear (aorticdissection). Both of these conditions can cause bleeding inside the body and can be  life threatening unless diagnosed and treated promptly. CAUSES  The exact cause of an abdominal aortic aneurysm is unknown. Some contributing factors are:   A hardening of the arteries caused by the buildup of fat and other substances in the lining of a blood vessel (arteriosclerosis).  Inflammation of the walls of an artery (arteritis).   Connective tissue diseases, such as Marfan syndrome.   Abdominal trauma.   An infection, such as syphilis or staphylococcus, in the wall of the aorta (infectious aortitis) caused by bacteria. RISK FACTORS  Risk factors that contribute to an abdominal aortic aneurysm may include:  Age older than 18 years.   High blood pressure (hypertension).  Male gender.  Ethnicity (white race).  Obesity.  Family history of aneurysm (first degree relatives only).  Tobacco use. PREVENTION  The following healthy lifestyle habits may help decrease your risk of abdominal aortic aneurysm:  Quitting smoking. Smoking can raise your blood pressure and cause arteriosclerosis.  Limiting or avoiding alcohol.  Keeping your  blood pressure, blood sugar level, and cholesterol levels within normal limits.  Decreasing your salt intake. In somepeople, too much salt can raise blood pressure and increase your risk of abdominal aortic aneurysm.  Eating a diet low in saturated fats and cholesterol.  Increasing your fiber intake by including whole grains, vegetables, and fruits in your diet. Eating these foods may help lower blood pressure.  Maintaining a healthy weight.  Staying physically active and exercising regularly. SYMPTOMS  The symptoms of abdominal aortic aneurysm may vary depending on the size and rate of growth of the aneurysm.Most grow slowly and do not have any symptoms. When symptoms do occur, they may include:  Pain (abdomen, side, lower back, or groin). The pain may vary in intensity. A sudden onset of severe pain may indicate that the aneurysm  has ruptured.  Feeling full after eating only small amounts of food.  Nausea or vomiting or both.  Feeling a pulsating lump in the abdomen.  Feeling faint or passing out. DIAGNOSIS  Since most unruptured abdominal aortic aneurysms have no symptoms, they are often discovered during diagnostic exams for other conditions. An aneurysm may be found during the following procedures:  Ultrasonography (A one-time screening for abdominal aortic aneurysm by ultrasonography is also recommended for all men aged 61-75 years who have ever smoked).  X-ray exams.  A computed tomography (CT).  Magnetic resonance imaging (MRI).  Angiography or arteriography. TREATMENT  Treatment of an abdominal aortic aneurysm depends on the size of your aneurysm, your age, and risk factors for rupture. Medication to control blood pressure and pain may be used to manage aneurysms smaller than 6 cm. Regular monitoring for enlargement may be recommended by your caregiver if:  The aneurysm is 3-4 cm in size (an annual ultrasonography may be recommended).  The aneurysm is 4-4.5 cm in size (an ultrasonography every 6 months may be recommended).  The aneurysm is larger than 4.5 cm in size (your caregiver may ask that you be examined by a vascular surgeon). If your aneurysm is larger than 6 cm, surgical repair may be recommended. There are two main methods for repair of an aneurysm:   Endovascular repair (a minimally invasive surgery). This is done most often.  Open repair. This method is used if an endovascular repair is not possible.   This information is not intended to replace advice given to you by your health care provider. Make sure you discuss any questions you have with your health care provider.   Document Released: 01/20/2005 Document Revised: 08/07/2012 Document Reviewed: 05/12/2012 Elsevier Interactive Patient Education [employer].

## 2015-02-18 ENCOUNTER — Other Ambulatory Visit: Payer: Self-pay | Admitting: Cardiovascular Disease

## 2015-02-23 ENCOUNTER — Other Ambulatory Visit: Payer: Self-pay | Admitting: Internal Medicine

## 2015-03-08 ENCOUNTER — Other Ambulatory Visit: Payer: Self-pay | Admitting: Internal Medicine

## 2015-04-08 ENCOUNTER — Other Ambulatory Visit: Payer: 59

## 2015-04-08 ENCOUNTER — Telehealth: Payer: Self-pay | Admitting: Cardiovascular Disease

## 2015-04-08 ENCOUNTER — Ambulatory Visit: Payer: 59

## 2015-04-08 DIAGNOSIS — I701 Atherosclerosis of renal artery: Secondary | ICD-10-CM | POA: Diagnosis not present

## 2015-04-08 DIAGNOSIS — R0602 Shortness of breath: Secondary | ICD-10-CM | POA: Diagnosis not present

## 2015-04-08 DIAGNOSIS — I714 Abdominal aortic aneurysm, without rupture, unspecified: Secondary | ICD-10-CM

## 2015-04-08 NOTE — Telephone Encounter (Signed)
Pt c/o medication issue:  1. Name of Medication: Effient   2. How are you currently taking this medication ? 10 mg po once daily  3. Are you having a reaction ? No   4. What is your medication issue? Patient had copay card last year that covered med cost until January this year and wants to know if there is a resource for assistance this year (samples or med assistance program) or if Rockey Situ would like to switch to something cheaper

## 2015-04-08 NOTE — Telephone Encounter (Signed)
Spoke w/ pt's wife.  Advised her that I am leaving samples of Effient and a coupon at the front desk for pt to pu/ at his convenience.  She states that pt is here at our office for u/s appt.  Apologized, advised that I received telephone message.  She asks that I give samples to pt while he is here. Called front desk, but pt has already left. Samples left for pt, as he has enough to last through the end of the month.

## 2015-04-10 ENCOUNTER — Other Ambulatory Visit: Payer: Self-pay | Admitting: Cardiovascular Disease

## 2015-04-29 ENCOUNTER — Other Ambulatory Visit: Payer: Self-pay | Admitting: Internal Medicine

## 2015-04-29 NOTE — Telephone Encounter (Signed)
Please refill once in PCP absence Thanks

## 2015-04-29 NOTE — Telephone Encounter (Signed)
done

## 2015-04-29 NOTE — Telephone Encounter (Signed)
Received refill request electronically Last refill 02/24/15 #90 Last office visit 9// Is it okay to refill?

## 2015-05-26 ENCOUNTER — Other Ambulatory Visit: Payer: Self-pay

## 2015-05-26 MED ORDER — EZETIMIBE 10 MG PO TABS: 10.0000 mg | ORAL_TABLET | Freq: Every day | ORAL | Status: DC

## 2015-05-26 NOTE — Telephone Encounter (Signed)
Please see note below. 

## 2015-05-26 NOTE — Telephone Encounter (Signed)
Notified patient zetia is not generic yet.

## 2015-05-26 NOTE — Telephone Encounter (Signed)
Pt would like generic 

## 2015-05-26 NOTE — Telephone Encounter (Signed)
Refill sent for zetia.  

## 2015-05-29 ENCOUNTER — Other Ambulatory Visit: Payer: Self-pay | Admitting: Internal Medicine

## 2015-05-30 ENCOUNTER — Encounter: Payer: Self-pay | Admitting: Internal Medicine

## 2015-05-30 NOTE — Telephone Encounter (Signed)
Called pt after I spoke with [place] and pt picked up rx for 90 tablets and pt is saying he doesn't know what he picked up because the medication is in the car. I asked pt to check the bottle then call me back.

## 2015-06-18 ENCOUNTER — Other Ambulatory Visit: Payer: Self-pay | Admitting: Internal Medicine

## 2015-06-25 ENCOUNTER — Other Ambulatory Visit: Payer: Self-pay | Admitting: Cardiovascular Disease

## 2015-08-11 ENCOUNTER — Encounter: Payer: Self-pay | Admitting: Cardiovascular Disease

## 2015-08-11 ENCOUNTER — Ambulatory Visit (INDEPENDENT_AMBULATORY_CARE_PROVIDER_SITE_OTHER): Payer: BLUE CROSS/BLUE SHIELD | Admitting: Cardiovascular Disease

## 2015-08-11 VITALS — BP 110/72 | HR 72 | Ht 65.0 in | Wt 156.1 lb

## 2015-08-11 DIAGNOSIS — I25119 Atherosclerotic heart disease of native coronary artery with unspecified angina pectoris: Secondary | ICD-10-CM | POA: Diagnosis not present

## 2015-08-11 DIAGNOSIS — I10 Essential (primary) hypertension: Secondary | ICD-10-CM | POA: Diagnosis not present

## 2015-08-11 DIAGNOSIS — E785 Hyperlipidemia, unspecified: Secondary | ICD-10-CM | POA: Diagnosis not present

## 2015-08-11 DIAGNOSIS — I25700 Atherosclerosis of coronary artery bypass graft(s), unspecified, with unstable angina pectoris: Secondary | ICD-10-CM | POA: Diagnosis not present

## 2015-08-11 MED ORDER — PRASUGREL HCL 10 MG PO TABS: 10.0000 mg | ORAL_TABLET | Freq: Every day | ORAL | Status: DC

## 2015-08-11 NOTE — Patient Instructions (Addendum)
You are doing well. No medication changes were made.  We will check labs today, lipds and LFTs  When the effient runs out, We will change to plavix one a day with aspirin  Please call us if you have new issues that need to be addressed before your next appt.  Your physician wants you to follow-up in: 6 months.  You will receive a reminder letter in the mail two months in advance. If you don't receive a letter, please call our office to schedule the follow-up appointment.

## 2015-08-11 NOTE — Assessment & Plan Note (Signed)
Reports compliance with his Crestor and zetia, Has follow-up lab work with Dr. Silvio Pate, Goal LDL less than 70

## 2015-08-11 NOTE — Progress Notes (Signed)
Patient ID: [name], male    DOB: [birth year], 65 y.o.   MRN: [id number]  HPI Comments: [name] is a 65 year old gentleman with coronary artery disease, bypass surgery in 1995, stenting at [facility] in February 2004 for MI with a 3.0 x 25 mm and 4.5 x 16 mm Monorail ( location uncertain), also 4.5 x 18 mm stent placed to the mid RCA, followup with repeat stenting in April 2005 to the proximal LAD with a Taxus stent 2.5 x 8 mm, and stenting to the proximal left circumflex with a 2.5 x 12 mm Taxus, with recent stent placed November 13, 2010 with a 4.0 x 9 mm stent placed to the left main. History of renal artery stent. Abdominal aortic aneurysm 3.2 cm Prior smoking history  He presents for routine followup of his coronary artery disease.  In follow-up, he reports doing well, denies any symptoms concerning for angina  severe problems with his teeth Was going to have most of his teeth pulled out and have dentures, he did not have this done In preparation, he was going to stop the effient. We cleared him to do so in the past Effient is expensive for him, he will be going on Medicare next month Reports blood pressure is well controlled Otherwise has been active, redoing the floors in his house with no symptoms.  EKG on today's visit shows normal sinus rhythm with rate 65 bpm, T-wave abnormality in lead 3, aVF, unable to exclude old inferior MI, PVCs noted  Other past medical history  chest pain 04/05/2014, went to [facility] head catheterization. This showed an occluded OM vessel with DES placed 2 Occluded mid LAD with a patent LIMA to the LAD, patent left circumflex, prior bare-metal stent into the OM1 was occluded in the distal portion just after the ostium of OM1. Occluded RCA, ejection fraction 35%  Previous episode of chest pain while in [place] with hospitalization at that time, negative stress test, 2013 recurrent chest pain requiring admission to [facility], repeat  catheterization performed 12/2011 This showed patent stents to the left circumflex and LAD with patent LIMA to the LAD. Vein graft to the OM and vein graft to the RCA was occluded  admission to [facility] at the beginning of July 2014. Notes from the hospital were reviewed. Ruled out for MI with negative cardiac enzymes, CT scan chest showing no PE. He had stress test 10/25/2012 showing old inferior wall MI with defect noted in the basal to mid inferior wall. Discharged on tramadol.   Ultrasound of his aorta in [place] 12/22/2011 showed distal aorta 3 cm, iliacs of normal size Previous lower extremity ultrasound  showed no significant disease  He used to be a smoker though stopped in 1995. Prior to that he smoked 3 packs per day. Smoked for approximately 25-28 years        Allergies  Allergen Reactions  . Metoprolol Tartrate Hives and Itching    Outpatient Encounter Prescriptions as of 08/11/2015  Medication Sig  . aspirin 81 MG tablet Take 81 mg by mouth daily.  . carvedilol (COREG) 3.125 MG tablet TAKE 1 TABLET BY MOUTH TWICE A DAY  . ezetimibe (ZETIA) 10 MG tablet Take 1 tablet (10 mg total) by mouth daily.  . isosorbide mononitrate (IMDUR) 30 MG 24 hr tablet TAKE ONE TABLET BY MOUTH AT BEDTIME  . lisinopril (PRINIVIL,ZESTRIL) 5 MG tablet TAKE 1 TABLET BY MOUTH DAILY  . mirtazapine (REMERON) 15 MG tablet TAKE ONE TABLET  BY MOUTH EVERY NIGHT AT BEDTIME FOR SLEEP.  . naproxen sodium (ALEVE) 220 MG tablet Take 220 mg by mouth daily as needed (pain).  . nitroGLYCERIN (NITROSTAT) 0.4 MG SL tablet Place 1 tablet (0.4 mg total) under the tongue every 5 (five) minutes as needed for chest pain.  Marland Kitchen omeprazole (PRILOSEC OTC) 20 MG tablet Take 20 mg by mouth daily.  . prasugrel (EFFIENT) 10 MG TABS tablet Take 1 tablet (10 mg total) by mouth daily.  . rosuvastatin (CRESTOR) 40 MG tablet Take 1 tablet (40 mg total) by mouth daily.  . [DISCONTINUED] EFFIENT 10 MG TABS tablet TAKE 1 TABLET BY  MOUTH DAILY  . [DISCONTINUED] atorvastatin (LIPITOR) 80 MG tablet Take 1 tablet (80 mg total) by mouth daily.   No facility-administered encounter medications on file as of 08/11/2015.    Past Medical History  Diagnosis Date  . CAD (coronary artery disease)     a. 1995 s/p CABG x 4: LIMA->LAD, VG->RI (known to be occluded), VG->AM->PDA;  b. 05/2002 Inf STEMI: VG->AM->PDA 100% treated w/ 2 BMS complicated by acute thrombosis req 3 BMS;  c. 07/2003 DES to  LAD & LCX (VG's to PDA & RI 100%);  d. 12/2010 Acute MI ([place]): DES to LCX & LM , LIMA ok,;  e.12/2011 Cath: LM/LCX stents ok , LIMA patent.  Marland Kitchen GERD (gastroesophageal reflux disease)   . Hyperlipidemia   . Hypertension   . Diverticulitis     1/06 Diverticulitis--CT of pelvis--diffuse sigmoid divertic  . AAA (abdominal aortic aneurysm) ([place])     a. Duplex 01/2012: stable infrarenal saccular AAA at 3.3cm x 3.2xm (f/u recommended 01/2013 per Dr. Rockey Situ)  . Renal artery stenosis ([place])     a. 03/2011 PTA and stenting of L RA. b. 01/2012 renal duplex - stable 1-59% R renal artery stenosis, normal L renal artery s/p stent.    Past Surgical History  Procedure Laterality Date  . Coronary artery bypass graft    . Closed reduction shoulder dislocation    . Angioplasty  1997  . Coronary stent placement  7/12    2 stents--Promus element plus (everolimus eluting)--[facility] in [place]  . Renal artery stent  03/2011 ?  Marland Kitchen Renal angiogram N/A 04/16/2011    Procedure: RENAL ANGIOGRAM;  Surgeon: Burnell Blanks, MD;  Location: [facility] CATH LAB;  Service: Cardiovascular;  Laterality: N/A;  . Left heart catheterization with coronary/graft angiogram N/A 01/04/2012    Procedure: LEFT HEART CATHETERIZATION WITH Beatrix Fetters;  Surgeon: Burnell Blanks, MD;  Location: [facility] CATH LAB;  Service: Cardiovascular;  Laterality: N/A;  . Left heart catheterization with coronary/graft angiogram N/A 04/05/2014    Procedure: LEFT HEART  CATHETERIZATION WITH Beatrix Fetters;  Surgeon: Jettie Booze, MD;  Location: [facility] CATH LAB;  Service: Cardiovascular;  Laterality: N/A;  . Percutaneous coronary stent intervention (pci-s)  04/05/2014    Procedure: PERCUTANEOUS CORONARY STENT INTERVENTION (PCI-S);  Surgeon: Jettie Booze, MD;  Location: [facility] CATH LAB;  Service: Cardiovascular;;  OM1  . Cardiac catheterization      8/09  Cath--vein graft occlusions which are old--no acute changes  . Cardiac catheterization  11/13/2010    stent x 2 @ [place]  . Cardiac catheterization  04/05/2014    stent placement   . Coronary angioplasty  04/05/2014    stent placement OM 1    Social History  reports that he quit smoking about 22 years ago. His smoking use included Cigarettes. He has a 75 pack-year  smoking history. He has never used smokeless tobacco. He reports that he drinks about 8.4 oz of alcohol per week. He reports that he does not use illicit drugs.  Family History family history includes Coronary artery disease in his brother, father, mother, and sister; Heart attack in his father, maternal grandfather, and mother; Hypertension in his mother. There is no history of Diabetes or Cancer.   Review of Systems  Constitutional: Negative.   HENT: Positive for dental problem.   Respiratory: Negative.   Cardiovascular: Negative.   Gastrointestinal: Negative.   Musculoskeletal: Negative.   Neurological: Negative.   Hematological: Negative.   Psychiatric/Behavioral: Negative.   All other systems reviewed and are negative.   BP 110/72 mmHg  Pulse 72  Ht 5\' 5"  (1.651 m)  Wt 156 lb 1.9 oz (70.816 kg)  BMI 25.98 kg/m2  SpO2 96%  Physical Exam  Constitutional: He is oriented to person, place, and time. He appears well-developed and well-nourished.  HENT:  Head: Normocephalic.  Nose: Nose normal.  Mouth/Throat: Oropharynx is clear and moist.  Eyes: Conjunctivae are normal. Pupils are equal, round, and reactive to  light.  Neck: Normal range of motion. Neck supple. No JVD present.  Cardiovascular: Normal rate, regular rhythm, S1 normal, S2 normal, normal heart sounds and intact distal pulses.  Exam reveals no gallop and no friction rub.   No murmur heard. Pulmonary/Chest: Effort normal and breath sounds normal. No respiratory distress. He has no wheezes. He has no rales. He exhibits no tenderness.  Abdominal: Soft. Bowel sounds are normal. He exhibits no distension. There is no tenderness.  Musculoskeletal: Normal range of motion. He exhibits no edema or tenderness.  Lymphadenopathy:    He has no cervical adenopathy.  Neurological: He is alert and oriented to person, place, and time. Coordination normal.  Skin: Skin is warm and dry. No rash noted. No erythema.  Psychiatric: He has a normal mood and affect. His behavior is normal. Judgment and thought content normal.      Assessment and Plan   Nursing note and vitals reviewed.

## 2015-08-11 NOTE — Assessment & Plan Note (Signed)
Blood pressure is well controlled on today's visit. No changes made to the medications. 

## 2015-08-11 NOTE — Assessment & Plan Note (Signed)
Acceptable risk for coming off his effient for dental procedure. Could change the effient to Plavix when he goes on Medicare

## 2015-08-12 LAB — HEPATIC FUNCTION PANEL
ALK PHOS: 53 IU/L (ref 39–117)
ALT: 26 IU/L (ref 0–44)
AST: 36 IU/L (ref 0–40)
Albumin: 4.5 g/dL (ref 3.6–4.8)
BILIRUBIN, DIRECT: 0.18 mg/dL (ref 0.00–0.40)
Bilirubin Total: 0.6 mg/dL (ref 0.0–1.2)
Total Protein: 7.2 g/dL (ref 6.0–8.5)

## 2015-08-12 LAB — LIPID PANEL
CHOLESTEROL TOTAL: 163 mg/dL (ref 100–199)
Chol/HDL Ratio: 3.5 ratio units (ref 0.0–5.0)
HDL: 46 mg/dL (ref >39)
LDL Calculated: 72 mg/dL (ref 0–99)
Triglycerides: 225 mg/dL — ABNORMAL HIGH (ref 0–149)
VLDL Cholesterol Cal: 45 mg/dL — ABNORMAL HIGH (ref 5–40)

## 2015-08-15 ENCOUNTER — Telehealth: Payer: Self-pay

## 2015-08-15 ENCOUNTER — Telehealth: Payer: Self-pay | Admitting: Cardiovascular Disease

## 2015-08-15 ENCOUNTER — Other Ambulatory Visit: Payer: Self-pay | Admitting: Cardiovascular Disease

## 2015-08-15 NOTE — Telephone Encounter (Signed)
Clearance request placed on Dr. Donivan Scull desk for review.

## 2015-08-15 NOTE — Telephone Encounter (Signed)
Form placed in Dr Letvak's InBox on his desk 

## 2015-08-15 NOTE — Telephone Encounter (Signed)
Routed this message to pt @ [phone].

## 2015-08-15 NOTE — Telephone Encounter (Signed)
Acceptable risk for his surgery. No further cardiac testing needed. Okay to stop effient for 5 days prior to the procedure. Would not stop his aspirin at any time Would restart the Effient following the dental extraction.

## 2015-08-15 NOTE — Telephone Encounter (Signed)
[name] [phone]  [name] dropped off a form to be filled out so that he can have some dental work and get some dentures. Please call as soon as ready, he would like to pick up the first of the week. Placed in [employer] in the font.

## 2015-08-15 NOTE — Telephone Encounter (Signed)
When complete please fax to patient [phone].

## 2015-08-15 NOTE — Telephone Encounter (Signed)
Form done---no charge

## 2015-08-15 NOTE — Telephone Encounter (Signed)
Patient dropped off clearance form for dental extractions .  Patient will call when he gets home to give [place] the fax number to his home so we can return completed form to him via fax .  Form placed in nurse inbox.

## 2015-08-17 ENCOUNTER — Other Ambulatory Visit: Payer: Self-pay | Admitting: Family Medicine

## 2015-08-17 ENCOUNTER — Other Ambulatory Visit: Payer: Self-pay | Admitting: Cardiovascular Disease

## 2015-08-18 NOTE — Telephone Encounter (Signed)
Routing Rx request to PCP.

## 2015-08-18 NOTE — Telephone Encounter (Signed)
Approved: refill for a year 

## 2015-08-18 NOTE — Telephone Encounter (Signed)
Robin faxed form to Dentist's office on Friday. I notified patient form was faxed.

## 2015-08-21 NOTE — Telephone Encounter (Signed)
Signed clearance form faxed to [place] @ [phone].

## 2015-11-11 ENCOUNTER — Ambulatory Visit (INDEPENDENT_AMBULATORY_CARE_PROVIDER_SITE_OTHER): Payer: Medicare Other | Admitting: Primary Care

## 2015-11-11 VITALS — BP 136/82 | HR 67 | Temp 97.7°F | Ht 65.0 in | Wt 151.8 lb

## 2015-11-11 DIAGNOSIS — I25700 Atherosclerosis of coronary artery bypass graft(s), unspecified, with unstable angina pectoris: Secondary | ICD-10-CM

## 2015-11-11 DIAGNOSIS — R21 Rash and other nonspecific skin eruption: Secondary | ICD-10-CM | POA: Diagnosis not present

## 2015-11-11 MED ORDER — TRIAMCINOLONE ACETONIDE 0.1 % EX CREA: 1.0000 "application " | TOPICAL_CREAM | Freq: Two times a day (BID) | CUTANEOUS | Status: DC

## 2015-11-11 NOTE — Patient Instructions (Addendum)
Start Allegra tablets every day for the next 2 weeks for itching and bumps. You may take benadryl at night if the itching keeps you from sleeping.  Apply Triamcinolone cream twice daily as needed for itching and rash.  Please notify me if no improvement in 2-3 days.  It was a pleasure meeting you!

## 2015-11-11 NOTE — Progress Notes (Signed)
Subjective:    Patient ID: [name], male    DOB: August 21, 1950, 64 y.o.   MRN: [id number]  HPI  [name] is a 65 year old male who presents today with a chief complaint of rash. His rash is in the form of small, reddened, raised bumps that have been present since yesterday (Monday). His rash is itchy with a burning sensation. His rash is located to his bilateral lower extremities (worse to the right popliteal fossa) and lower back. He was mowing his lawn Sunday afternoon which is a heavy [place]ed area.   Denies changes in soaps, detergents, foods, tick bites,etc. No one else in his household has noticed this rash or any itching. Denies fevers, fatigue, chills, nausea. He's not taken or applied anything OTC for his symptoms. He's since washed all of his bed linens and clothing. Overall his symptoms have improved.  Review of Systems  Constitutional: Negative for fever and chills.  HENT: Negative for sore throat.   Skin: Positive for rash.       Pruritus   Neurological: Negative for weakness.       Past Medical History  Diagnosis Date  . CAD (coronary artery disease)     a. 1995 s/p CABG x 4: LIMA->LAD, VG->RI (known to be occluded), VG->AM->PDA;  b. 05/2002 Inf STEMI: VG->AM->PDA 100% treated w/ 2 BMS complicated by acute thrombosis req 3 BMS;  c. 07/2003 DES to  LAD & LCX (VG's to PDA & RI 100%);  d. 12/2010 Acute MI ([place]): DES to LCX & LM , LIMA ok,;  e.12/2011 Cath: LM/LCX stents ok , LIMA patent.  Marland Kitchen GERD (gastroesophageal reflux disease)   . Hyperlipidemia   . Hypertension   . Diverticulitis     1/06 Diverticulitis--CT of pelvis--diffuse sigmoid divertic  . AAA (abdominal aortic aneurysm) ([place])     a. Duplex 01/2012: stable infrarenal saccular AAA at 3.3cm x 3.2xm (f/u recommended 01/2013 per Dr. Rockey Situ)  . Renal artery stenosis ([place])     a. 03/2011 PTA and stenting of L RA. b. 01/2012 renal duplex - stable 1-59% R renal artery stenosis, normal L renal artery s/p stent.       Social History   Social History  . Marital Status: Married    Spouse Name: N/A  . Number of Children: 3  . Years of Education: N/A   Occupational History  . 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 Smoking status: Former Smoker -- 3.00 packs/day for 25 years    Types: Cigarettes    Quit date: 05/28/1993  . Smokeless tobacco: Never Used  . Alcohol Use: 8.4 oz/week    14 Cans of beer per week  . Drug Use: No  . Sexual Activity: Not on file   Other Topics Concern  . Not on file   Social History Narrative    Past Surgical History  Procedure Laterality Date  . Coronary artery bypass graft    . Closed reduction shoulder dislocation    . Angioplasty  1997  . Coronary stent placement  7/12    2 stents--Promus element plus (everolimus eluting)--[facility] in [place]  . Renal artery stent  03/2011 ?  Marland Kitchen Renal angiogram N/A 04/16/2011    Procedure: RENAL ANGIOGRAM;  Surgeon: Burnell Blanks, MD;  Location: [facility] CATH LAB;  Service: Cardiovascular;  Laterality: N/A;  . Left heart catheterization with coronary/graft angiogram N/A 01/04/2012  Procedure: LEFT HEART CATHETERIZATION WITH Beatrix Fetters;  Surgeon: Burnell Blanks, MD;  Location: [facility] CATH LAB;  Service: Cardiovascular;  Laterality: N/A;  . Left heart catheterization with coronary/graft angiogram N/A 04/05/2014    Procedure: LEFT HEART CATHETERIZATION WITH Beatrix Fetters;  Surgeon: Jettie Booze, MD;  Location: [facility] CATH LAB;  Service: Cardiovascular;  Laterality: N/A;  . Percutaneous coronary stent intervention (pci-s)  04/05/2014    Procedure: PERCUTANEOUS CORONARY STENT INTERVENTION (PCI-S);  Surgeon: Jettie Booze, MD;  Location: [facility] CATH LAB;  Service: Cardiovascular;;  OM1  . Cardiac catheterization      8/09  Cath--vein graft occlusions which are old--no acute changes  . Cardiac catheterization   11/13/2010    stent x 2 @ [place]  . Cardiac catheterization  04/05/2014    stent placement   . Coronary angioplasty  04/05/2014    stent placement OM 1    Family History  Problem Relation Age of Onset  . Coronary artery disease Mother     Died MI age 93  . Hypertension Mother   . Heart attack Mother   . Coronary artery disease Sister     Living  . Coronary artery disease Brother     Living  . Diabetes Neg Hx   . Cancer Neg Hx     prostate or colon  . Coronary artery disease Father     Died MI age 19  . Heart attack Father   . Heart attack Maternal Grandfather     Allergies  Allergen Reactions  . Metoprolol Tartrate Hives and Itching    Current Outpatient Prescriptions on File Prior to Visit  Medication Sig Dispense Refill  . aspirin 81 MG tablet Take 81 mg by mouth daily.    . carvedilol (COREG) 3.125 MG tablet TAKE 1 TABLET BY MOUTH TWICE A DAY 180 tablet 3  . ezetimibe (ZETIA) 10 MG tablet Take 1 tablet (10 mg total) by mouth daily. 30 tablet 3  . isosorbide mononitrate (IMDUR) 30 MG 24 hr tablet TAKE ONE TABLET BY MOUTH EVERY NIGHT AT BEDTIME 30 tablet 3  . lisinopril (PRINIVIL,ZESTRIL) 5 MG tablet TAKE 1 TABLET BY MOUTH DAILY 30 tablet 3  . mirtazapine (REMERON) 15 MG tablet TAKE ONE TABLET BY MOUTH AT BEDTIME AS NEEDED FOR SLEEP 90 tablet 1  . naproxen sodium (ALEVE) 220 MG tablet Take 220 mg by mouth daily as needed (pain).    . nitroGLYCERIN (NITROSTAT) 0.4 MG SL tablet Place 1 tablet (0.4 mg total) under the tongue every 5 (five) minutes as needed for chest pain. 25 tablet 6  . omeprazole (PRILOSEC OTC) 20 MG tablet Take 20 mg by mouth daily.    . prasugrel (EFFIENT) 10 MG TABS tablet Take 1 tablet (10 mg total) by mouth daily. 90 each 3  . rosuvastatin (CRESTOR) 40 MG tablet Take 1 tablet (40 mg total) by mouth daily. 30 tablet 11   No current facility-administered medications on file prior to visit.    BP 136/82 mmHg  Pulse 67  Temp(Src) 97.7 F (36.5 C)  (Oral)  Ht 5\' 5"  (1.651 m)  Wt 151 lb 12.8 oz (68.856 kg)  BMI 25.26 kg/m2  SpO2 99%    Objective:   Physical Exam  Constitutional: He appears well-nourished.  Cardiovascular: Normal rate and regular rhythm.   Pulmonary/Chest: Effort normal and breath sounds normal.  Skin: Skin is warm and dry.  Numerous reddend, raised bumps representative of insect bites to right popliteal fossa with  few bites to posterior trunk and lower extremities. Overall appears mild.          Assessment & Plan:  Pruritus Secondary to Insect Bites:  Mowing lawn Sunday afternoon, itching with bites noted to body Monday night. He's not applied anything OTC for his symptoms. No one else in his household is experiencing these symptoms. Exam consistent with chigger/other insect bites to extremities. Overall mild case. Will have him start daily antihistamine and apply low potency triamcinolone cream PRN itching. Benadryl HS PRN sleep and itching. Discussed importance of insect repellant when in thick grass/wooded areas. Follow up PRN.

## 2015-11-11 NOTE — Progress Notes (Signed)
Pre visit review using our clinic review tool, if applicable. No additional management support is needed unless otherwise documented below in the visit note. 

## 2015-12-08 ENCOUNTER — Other Ambulatory Visit: Payer: Self-pay | Admitting: Cardiovascular Disease

## 2016-01-02 ENCOUNTER — Other Ambulatory Visit: Payer: Self-pay | Admitting: Cardiovascular Disease

## 2016-01-05 ENCOUNTER — Encounter: Payer: Self-pay | Admitting: Internal Medicine

## 2016-01-05 ENCOUNTER — Ambulatory Visit (INDEPENDENT_AMBULATORY_CARE_PROVIDER_SITE_OTHER): Payer: Medicare Other | Admitting: Internal Medicine

## 2016-01-05 VITALS — BP 142/90 | HR 60 | Temp 97.5°F | Ht 64.0 in | Wt 156.0 lb

## 2016-01-05 DIAGNOSIS — K219 Gastro-esophageal reflux disease without esophagitis: Secondary | ICD-10-CM | POA: Diagnosis not present

## 2016-01-05 DIAGNOSIS — I25119 Atherosclerotic heart disease of native coronary artery with unspecified angina pectoris: Secondary | ICD-10-CM

## 2016-01-05 DIAGNOSIS — I714 Abdominal aortic aneurysm, without rupture, unspecified: Secondary | ICD-10-CM

## 2016-01-05 DIAGNOSIS — Z7189 Other specified counseling: Secondary | ICD-10-CM | POA: Insufficient documentation

## 2016-01-05 DIAGNOSIS — Z1159 Encounter for screening for other viral diseases: Secondary | ICD-10-CM

## 2016-01-05 DIAGNOSIS — Z23 Encounter for immunization: Secondary | ICD-10-CM | POA: Diagnosis not present

## 2016-01-05 DIAGNOSIS — I701 Atherosclerosis of renal artery: Secondary | ICD-10-CM

## 2016-01-05 DIAGNOSIS — Z0000 Encounter for general adult medical examination without abnormal findings: Secondary | ICD-10-CM

## 2016-01-05 DIAGNOSIS — I10 Essential (primary) hypertension: Secondary | ICD-10-CM

## 2016-01-05 LAB — CBC WITH DIFFERENTIAL/PLATELET
BASOS ABS: 0.1 10*3/uL (ref 0.0–0.1)
Basophils Relative: 0.7 % (ref 0.0–3.0)
EOS ABS: 0.3 10*3/uL (ref 0.0–0.7)
EOS PCT: 3.3 % (ref 0.0–5.0)
HCT: 41.6 % (ref 39.0–52.0)
HEMOGLOBIN: 13.9 g/dL (ref 13.0–17.0)
Lymphocytes Relative: 24.4 % (ref 12.0–46.0)
Lymphs Abs: 2.1 10*3/uL (ref 0.7–4.0)
MCHC: 33.4 g/dL (ref 30.0–36.0)
MCV: 93.7 fl (ref 78.0–100.0)
MONO ABS: 0.9 10*3/uL (ref 0.1–1.0)
Monocytes Relative: 9.8 % (ref 3.0–12.0)
NEUTROS PCT: 61.8 % (ref 43.0–77.0)
Neutro Abs: 5.4 10*3/uL (ref 1.4–7.7)
Platelets: 307 10*3/uL (ref 150.0–400.0)
RBC: 4.44 Mil/uL (ref 4.22–5.81)
RDW: 15.6 % — ABNORMAL HIGH (ref 11.5–15.5)
WBC: 8.7 10*3/uL (ref 4.0–10.5)

## 2016-01-05 LAB — RENAL FUNCTION PANEL
ALBUMIN: 4.1 g/dL (ref 3.5–5.2)
BUN: 4 mg/dL — ABNORMAL LOW (ref 6–23)
CO2: 26 mEq/L (ref 19–32)
CREATININE: 0.78 mg/dL (ref 0.40–1.50)
Calcium: 9.4 mg/dL (ref 8.4–10.5)
Chloride: 97 mEq/L (ref 96–112)
GFR: 106.07 mL/min (ref >60.00)
GLUCOSE: 100 mg/dL — AB (ref 70–99)
POTASSIUM: 4.8 meq/L (ref 3.5–5.1)
Phosphorus: 3.3 mg/dL (ref 2.3–4.6)
SODIUM: 132 meq/L — AB (ref 135–145)

## 2016-01-05 NOTE — Assessment & Plan Note (Signed)
No heartburn or dysphagia if he takes the PPI

## 2016-01-05 NOTE — Assessment & Plan Note (Signed)
Small Regular checks per cardiology

## 2016-01-05 NOTE — Assessment & Plan Note (Signed)
BP Readings from Last 3 Encounters:  01/05/16 (!) 142/90  11/11/15 136/82  08/11/15 110/72   Generally okay No change for now

## 2016-01-05 NOTE — Assessment & Plan Note (Signed)
No recent pain on the isosorbide

## 2016-01-05 NOTE — Assessment & Plan Note (Signed)
Blank forms given 

## 2016-01-05 NOTE — Patient Instructions (Signed)
DASH Eating Plan  DASH stands for "Dietary Approaches to Stop Hypertension." The DASH eating plan is a healthy eating plan that has been shown to reduce high blood pressure (hypertension). Additional health benefits may include reducing the risk of type 2 diabetes mellitus, heart disease, and stroke. The DASH eating plan may also help with weight loss.  WHAT DO I NEED TO KNOW ABOUT THE DASH EATING PLAN?  For the DASH eating plan, you will follow these general guidelines:  · Choose foods with a percent daily value for sodium of less than 5% (as listed on the food label).  · Use salt-free seasonings or herbs instead of table salt or sea salt.  · Check with your health care provider or pharmacist before using salt substitutes.  · Eat lower-sodium products, often labeled as "lower sodium" or "no salt added."  · Eat fresh foods.  · Eat more vegetables, fruits, and low-fat dairy products.  · Choose whole grains. Look for the word "whole" as the first word in the ingredient list.  · Choose fish and skinless chicken or turkey more often than red meat. Limit fish, poultry, and meat to 6 oz (170 g) each day.  · Limit sweets, desserts, sugars, and sugary drinks.  · Choose heart-healthy fats.  · Limit cheese to 1 oz (28 g) per day.  · Eat more home-cooked food and less restaurant, buffet, and fast food.  · Limit fried foods.  · Cook foods using methods other than frying.  · Limit canned vegetables. If you do use them, rinse them well to decrease the sodium.  · When eating at a restaurant, ask that your food be prepared with less salt, or no salt if possible.  WHAT FOODS CAN I EAT?  Seek help from a dietitian for individual calorie needs.  Grains  Whole grain or whole wheat bread. Brown rice. Whole grain or whole wheat pasta. Quinoa, bulgur, and whole grain cereals. Low-sodium cereals. Corn or whole wheat flour tortillas. Whole grain cornbread. Whole grain crackers. Low-sodium crackers.  Vegetables  Fresh or frozen vegetables  (raw, steamed, roasted, or grilled). Low-sodium or reduced-sodium tomato and vegetable juices. Low-sodium or reduced-sodium tomato sauce and paste. Low-sodium or reduced-sodium canned vegetables.   Fruits  All fresh, canned (in natural juice), or frozen fruits.  Meat and Other Protein Products  Ground beef (85% or leaner), grass-fed beef, or beef trimmed of fat. Skinless chicken or turkey. Ground chicken or turkey. Pork trimmed of fat. All fish and seafood. Eggs. Dried beans, peas, or lentils. Unsalted nuts and seeds. Unsalted canned beans.  Dairy  Low-fat dairy products, such as skim or 1% milk, 2% or reduced-fat cheeses, low-fat ricotta or cottage cheese, or plain low-fat yogurt. Low-sodium or reduced-sodium cheeses.  Fats and Oils  Tub margarines without trans fats. Light or reduced-fat mayonnaise and salad dressings (reduced sodium). Avocado. Safflower, olive, or canola oils. Natural peanut or almond butter.  Other  Unsalted popcorn and pretzels.  The items listed above may not be a complete list of recommended foods or beverages. Contact your dietitian for more options.  WHAT FOODS ARE NOT RECOMMENDED?  Grains  White bread. White pasta. White rice. Refined cornbread. Bagels and croissants. Crackers that contain trans fat.  Vegetables  Creamed or fried vegetables. Vegetables in a cheese sauce. Regular canned vegetables. Regular canned tomato sauce and paste. Regular tomato and vegetable juices.  Fruits  Dried fruits. Canned fruit in light or heavy syrup. Fruit juice.  Meat and Other Protein   Products  Fatty cuts of meat. Ribs, chicken wings, bacon, sausage, bologna, salami, chitterlings, fatback, hot dogs, bratwurst, and packaged luncheon meats. Salted nuts and seeds. Canned beans with salt.  Dairy  Whole or 2% milk, cream, half-and-half, and cream cheese. Whole-fat or sweetened yogurt. Full-fat cheeses or blue cheese. Nondairy creamers and whipped toppings. Processed cheese, cheese spreads, or cheese  curds.  Condiments  Onion and garlic salt, seasoned salt, table salt, and sea salt. Canned and packaged gravies. Worcestershire sauce. Tartar sauce. Barbecue sauce. Teriyaki sauce. Soy sauce, including reduced sodium. Steak sauce. Fish sauce. Oyster sauce. Cocktail sauce. Horseradish. Ketchup and mustard. Meat flavorings and tenderizers. Bouillon cubes. Hot sauce. Tabasco sauce. Marinades. Taco seasonings. Relishes.  Fats and Oils  Butter, stick margarine, lard, shortening, ghee, and bacon fat. Coconut, palm kernel, or palm oils. Regular salad dressings.  Other  Pickles and olives. Salted popcorn and pretzels.  The items listed above may not be a complete list of foods and beverages to avoid. Contact your dietitian for more information.  WHERE CAN I FIND MORE INFORMATION?  National Heart, Lung, and Blood Institute: www.nhlbi.nih.gov/health/health-topics/topics/dash/     This information is not intended to replace advice given to you by your health care provider. Make sure you discuss any questions you have with your health care provider.     Document Released: 04/01/2011 Document Revised: 05/03/2014 Document Reviewed: 02/14/2013  Elsevier Interactive Patient Education ©2016 Elsevier Inc.

## 2016-01-05 NOTE — Progress Notes (Signed)
Subjective:    Patient ID: [name], male    DOB: [birth year], 65 y.o.   MRN: [id number]  HPI Here for Welcome to Medicare visit and follow up of chronic health conditions Reviewed form and advanced directives Reviewed other doctors No tobacco products Usually has 3-4 beers per day No falls No depression or anhedonia Vision and hearing are okay No regular exercise but stays busy Independent with instrumental ADLs  He is concerned about easy bleeding He got little scratch on left arm while bush hogging  Bled through even the next day Discussed that it is related to the meds---he needs to discuss with Dr Rockey Situ  Ongoing dental problems He has gone several places--only 1 person willing to do it, but is $12K (with out the dentures)  No chest pain No SOB No palpitations Stays active on his land Legs do get tired with extended exertion No edema  No recent ultrasound of AAA or renal artery Due this fall  Current Outpatient Prescriptions on File Prior to Visit  Medication Sig Dispense Refill  . aspirin 81 MG tablet Take 81 mg by mouth daily.    . carvedilol (COREG) 3.125 MG tablet TAKE 1 TABLET BY MOUTH TWICE A DAY 180 tablet 3  . ezetimibe (ZETIA) 10 MG tablet Take 1 tablet (10 mg total) by mouth daily. 30 tablet 3  . isosorbide mononitrate (IMDUR) 30 MG 24 hr tablet TAKE ONE TABLET BY MOUTH EVERY NIGHT AT BEDTIME 30 tablet 1  . lisinopril (PRINIVIL,ZESTRIL) 5 MG tablet TAKE 1 TABLET BY MOUTH DAILY 30 tablet 3  . mirtazapine (REMERON) 15 MG tablet TAKE ONE TABLET BY MOUTH AT BEDTIME AS NEEDED FOR SLEEP 90 tablet 1  . naproxen sodium (ALEVE) 220 MG tablet Take 220 mg by mouth daily as needed (pain).    . nitroGLYCERIN (NITROSTAT) 0.4 MG SL tablet Place 1 tablet (0.4 mg total) under the tongue every 5 (five) minutes as needed for chest pain. 25 tablet 6  . omeprazole (PRILOSEC OTC) 20 MG tablet Take 20 mg by mouth daily.    . prasugrel (EFFIENT) 10 MG TABS tablet Take 1  tablet (10 mg total) by mouth daily. 90 each 3  . rosuvastatin (CRESTOR) 40 MG tablet Take 1 tablet (40 mg total) by mouth daily. 30 tablet 11  . triamcinolone cream (KENALOG) 0.1 % Apply 1 application topically 2 (two) times daily. 30 g 0   No current facility-administered medications on file prior to visit.     Allergies  Allergen Reactions  . Metoprolol Tartrate Hives and Itching    Past Medical History:  Diagnosis Date  . AAA (abdominal aortic aneurysm) ([place])    a. Duplex 01/2012: stable infrarenal saccular AAA at 3.3cm x 3.2xm (f/u recommended 01/2013 per Dr. Rockey Situ)  . CAD (coronary artery disease)    a. 1995 s/p CABG x 4: LIMA->LAD, VG->RI (known to be occluded), VG->AM->PDA;  b. 05/2002 Inf STEMI: VG->AM->PDA 100% treated w/ 2 BMS complicated by acute thrombosis req 3 BMS;  c. 07/2003 DES to  LAD & LCX (VG's to PDA & RI 100%);  d. 12/2010 Acute MI ([place]): DES to LCX & LM , LIMA ok,;  e.12/2011 Cath: LM/LCX stents ok , LIMA patent.  . Diverticulitis    1/06 Diverticulitis--CT of pelvis--diffuse sigmoid divertic  . GERD (gastroesophageal reflux disease)   . Hyperlipidemia   . Hypertension   . Renal artery stenosis ([place])    a. 03/2011 PTA and stenting of L RA. b. 01/2012  renal duplex - stable 1-59% R renal artery stenosis, normal L renal artery s/p stent.    Past Surgical History:  Procedure Laterality Date  . ANGIOPLASTY  1997  . CARDIAC CATHETERIZATION     8/09  Cath--vein graft occlusions which are old--no acute changes  . CARDIAC CATHETERIZATION  11/13/2010   stent x 2 @ [place]  . CARDIAC CATHETERIZATION  04/05/2014   stent placement   . CLOSED REDUCTION SHOULDER DISLOCATION    . CORONARY ANGIOPLASTY  04/05/2014   stent placement OM 1  . CORONARY ARTERY BYPASS GRAFT    . CORONARY STENT PLACEMENT  7/12   2 stents--Promus element plus (everolimus eluting)--[facility] in [place]  . LEFT HEART CATHETERIZATION WITH CORONARY/GRAFT ANGIOGRAM N/A 01/04/2012    Procedure: LEFT HEART CATHETERIZATION WITH Beatrix Fetters;  Surgeon: Burnell Blanks, MD;  Location: [facility] CATH LAB;  Service: Cardiovascular;  Laterality: N/A;  . LEFT HEART CATHETERIZATION WITH CORONARY/GRAFT ANGIOGRAM N/A 04/05/2014   Procedure: LEFT HEART CATHETERIZATION WITH Beatrix Fetters;  Surgeon: Jettie Booze, MD;  Location: [facility] CATH LAB;  Service: Cardiovascular;  Laterality: N/A;  . PERCUTANEOUS CORONARY STENT INTERVENTION (PCI-S)  04/05/2014   Procedure: PERCUTANEOUS CORONARY STENT INTERVENTION (PCI-S);  Surgeon: Jettie Booze, MD;  Location: [facility] CATH LAB;  Service: Cardiovascular;;  OM1  . RENAL ANGIOGRAM N/A 04/16/2011   Procedure: RENAL ANGIOGRAM;  Surgeon: Burnell Blanks, MD;  Location[facility] C[facility] LAB;  Service: Cardiovascular;  Laterality: N/A;  . RENAL ARTERY STENT  03/2011 ?    Family History  Problem Relation Age of Onset  . Coronary artery disease Mother     Died MI age 46  . Hypertension Mother   . Heart attack Mother   . Coronary artery disease Sister     Living  . Coronary artery disease Brother     Living  . Coronary artery disease Father     Died MI age 84  . Heart attack Father   . Heart attack Maternal Grandfather   . Diabetes Neg Hx   . Cancer Neg Hx     prostate or colon    Social History   Social History  . Marital status: Married    Spouse name: N/A  . Number of children: 3  . Years of education: N/A   Occupational History  . Geneticist, molecular (Animal nutritionist)     Retired   Social History Main Topics  . Smoking status: Former Smoker    Packs/day: 3.00    Years: 25.00    Types: Cigarettes    Quit date: 05/28/1993  . Smokeless tobacco: Never Used  . Alcohol use 8.4 oz/week    14 Cans of beer per week  . Drug use: No  . Sexual activity: Not on file   Other Topics Concern  . Not on file   Social History Narrative   No living will   No health care POA but requests wife   Would accept  resuscitation   Not sure about tube feeds   Review of Systems  Appetite is not great--but weight is stable Sleeps okay Wears seat belt Still gets occasional bumps---like from last visit.  Come and go (hives?) Some low back soreness at times-- no meds Bowels are okay--no blood in stool No urinary problems--stream is okay     Objective:   Physical Exam  Constitutional: He is oriented to person, place, and time. He appears well-developed and well-nourished. No distress.  HENT:  Mouth/Throat: Oropharynx  is clear and moist. No oropharyngeal exudate.  Teeth in poor condition  Neck: Normal range of motion. No thyromegaly present.  Cardiovascular: Normal rate, regular rhythm and normal heart sounds.  Exam reveals no gallop.   No murmur heard. Faint pulse right foot--absent on left  Pulmonary/Chest: Effort normal and breath sounds normal. No respiratory distress. He has no wheezes. He has no rales.  Abdominal: Soft. There is no tenderness.  Musculoskeletal: He exhibits no edema or tenderness.  Lymphadenopathy:    He has no cervical adenopathy.  Neurological: He is alert and oriented to person, place, and time.  President-- "Daisy Floro, Ophelia Shoulder" [id number] Recall 3/3  Skin:  Abrasion left forearm  Psychiatric: He has a normal mood and affect. His behavior is normal.          Assessment & Plan:

## 2016-01-05 NOTE — Addendum Note (Signed)
Addended by: Pilar Grammes on: 01/05/2016 05:40 PM   Modules accepted: Orders

## 2016-01-05 NOTE — Assessment & Plan Note (Signed)
I have personally reviewed the Medicare Annual Wellness questionnaire and have noted 1. The patient's medical and social history 2. Their use of alcohol, tobacco or illicit drugs 3. Their current medications and supplements 4. The patient's functional ability including ADL's, fall risks, home safety risks and hearing or visual             impairment. 5. Diet and physical activities 6. Evidence for depression or mood disorders  The patients weight, height, BMI and visual acuity have been recorded in the chart I have made referrals, counseling and provided education to the patient based review of the above and I have provided the pt with a written personalized care plan for preventive services.  I have provided you with a copy of your personalized plan for preventive services. Please take the time to review along with your updated medication list.  Will give prevnar and flu vaccine today Discussed cancer screening--he prefers none Discussed healthy eating and exercise

## 2016-01-05 NOTE — Assessment & Plan Note (Signed)
Stented in the past Will recheck renal function

## 2016-01-06 LAB — HEPATITIS C ANTIBODY: HCV AB: NEGATIVE

## 2016-01-06 LAB — T4, FREE: FREE T4: 0.83 ng/dL (ref 0.60–1.60)

## 2016-02-09 ENCOUNTER — Ambulatory Visit (INDEPENDENT_AMBULATORY_CARE_PROVIDER_SITE_OTHER): Payer: Medicare Other | Admitting: Cardiovascular Disease

## 2016-02-09 ENCOUNTER — Encounter: Payer: Self-pay | Admitting: Cardiovascular Disease

## 2016-02-09 VITALS — BP 144/86 | HR 60 | Ht 64.0 in | Wt 161.5 lb

## 2016-02-09 DIAGNOSIS — I701 Atherosclerosis of renal artery: Secondary | ICD-10-CM

## 2016-02-09 DIAGNOSIS — I10 Essential (primary) hypertension: Secondary | ICD-10-CM

## 2016-02-09 DIAGNOSIS — R0602 Shortness of breath: Secondary | ICD-10-CM | POA: Diagnosis not present

## 2016-02-09 DIAGNOSIS — I714 Abdominal aortic aneurysm, without rupture, unspecified: Secondary | ICD-10-CM

## 2016-02-09 DIAGNOSIS — I25700 Atherosclerosis of coronary artery bypass graft(s), unspecified, with unstable angina pectoris: Secondary | ICD-10-CM

## 2016-02-09 DIAGNOSIS — I25119 Atherosclerotic heart disease of native coronary artery with unspecified angina pectoris: Secondary | ICD-10-CM

## 2016-02-09 DIAGNOSIS — K089 Disorder of teeth and supporting structures, unspecified: Secondary | ICD-10-CM

## 2016-02-09 DIAGNOSIS — E785 Hyperlipidemia, unspecified: Secondary | ICD-10-CM

## 2016-02-09 DIAGNOSIS — I200 Unstable angina: Secondary | ICD-10-CM | POA: Diagnosis not present

## 2016-02-09 DIAGNOSIS — G8929 Other chronic pain: Secondary | ICD-10-CM

## 2016-02-09 NOTE — Progress Notes (Signed)
Cardiology Office Note  Date:  02/09/2016   ID:  [name], DOB 1950/09/03, MRN [id number]  PCP:  Viviana Simpler, MD   Chief Complaint  Patient presents with  . Other    6 mo f/u.  Pt c/o chronic SOBOE.      HPI:  [name] is a 65 year old gentleman with coronary artery disease, bypass surgery in 1995, stenting at [facility] in February 2004 for MI with a 3.0 x 25 mm and 4.5 x 16 mm Monorail ( location uncertain), also 4.5 x 18 mm stent placed to the mid RCA, followup with repeat stenting in April 2005 to the proximal LAD with a Taxus stent 2.5 x 8 mm, and stenting to the proximal left circumflex with a 2.5 x 12 mm Taxus, with recent stent placed November 13, 2010 with a 4.0 x 9 mm stent placed to the left main. History of renal artery stent. Abdominal aortic aneurysm 3.2 cm Prior smoking history  He presents for routine followup of his coronary artery disease.  Severe problems with his teeth Did not have teeth worked on, 13K dollars locally Does not have the money No regular exercise program but does stay active Able to afford Effient at this time Did not want to change to Plavix as he is on PPI Tried alternative GERD medications, did not work as well No chest pain concerning for angina Blood pressure well controlled at home  Lab work reviewed with him Total chol 163, LDL 72  EKG on today's visit shows normal sinus rhythm with rate 65 bpm, nonspecific T wave abnormality in inferior leads, no change from prior EKG  Other past medical history  chest pain 04/05/2014, went to [facility] head catheterization. This showed an occluded OM vessel with DES placed 2 Occluded mid LAD with a patent LIMA to the LAD, patent left circumflex, prior bare-metal stent into the OM1 was occluded in the distal portion just after the ostium of OM1. Occluded RCA, ejection fraction 35%  Previous episode of chest pain while in [place] with hospitalization at that time, negative  stress test, 2013 recurrent chest pain requiring admission to [facility], repeat catheterization performed 12/2011 This showed patent stents to the left circumflex and LAD with patent LIMA to the LAD. Vein graft to the OM and vein graft to the RCA was occluded  admission to [facility] at the beginning of July 2014. Notes from the hospital were reviewed. Ruled out for MI with negative cardiac enzymes, CT scan chest showing no PE. He had stress test 10/25/2012 showing old inferior wall MI with defect noted in the basal to mid inferior wall. Discharged on tramadol.   Ultrasound of his aorta in [place] 12/22/2011 showed distal aorta 3 cm, iliacs of normal size Previous lower extremity ultrasound  showed no significant disease  He used to be a smoker though stopped in 1995. Prior to that he smoked 3 packs per day. Smoked for approximately 25-28 years    PMH:   has a past medical history of AAA (abdominal aortic aneurysm) ([place]); CAD (coronary artery disease); Diverticulitis; GERD (gastroesophageal reflux disease); Hyperlipidemia; Hypertension; and Renal artery stenosis ([place]).  PSH:    Past Surgical History:  Procedure Laterality Date  . ANGIOPLASTY  1997  . CARDIAC CATHETERIZATION     8/09  Cath--vein graft occlusions which are old--no acute changes  . CARDIAC CATHETERIZATION  11/13/2010   stent x 2 @ [place]  . CARDIAC CATHETERIZATION  04/05/2014   stent placement   .  CLOSED REDUCTION SHOULDER DISLOCATION    . CORONARY ANGIOPLASTY  04/05/2014   stent placement OM 1  . CORONARY ARTERY BYPASS GRAFT    . CORONARY STENT PLACEMENT  7/12   2 stents--Promus element plus (everolimus eluting)--[facility] in [place]  . LEFT HEART CATHETERIZATION WITH CORONARY/GRAFT ANGIOGRAM N/A 01/04/2012   Procedure: LEFT HEART CATHETERIZATION WITH Beatrix Fetters;  Surgeon: Burnell Blanks, MD;  Location[facility] C[facility] LAB;  Service: Cardiovascular;  Laterality: N/A;  . LEFT HEART  CATHETERIZATION WITH CORONARY/GRAFT ANGIOGRAM N/A 04/05/2014   Procedure: LEFT HEART CATHETERIZATION WITH Beatrix Fetters;  Surgeon: Jettie Booze, MD;  Location: [facility] CATH LAB;  Service: Cardiovascular;  Laterality: N/A;  . PERCUTANEOUS CORONARY STENT INTERVENTION (PCI-S)  04/05/2014   Procedure: PERCUTANEOUS CORONARY STENT INTERVENTION (PCI-S);  Surgeon: Jettie Booze, MD;  Location: [facility] CATH LAB;  Service: Cardiovascular;;  OM1  . RENAL ANGIOGRAM N/A 04/16/2011   Procedure: RENAL ANGIOGRAM;  Surgeon: Burnell Blanks, MD;  Location: [facility] CATH LAB;  Service: Cardiovascular;  Laterality: N/A;  . RENAL ARTERY STENT  03/2011 ?    Current Outpatient Prescriptions  Medication Sig Dispense Refill  . aspirin 81 MG tablet Take 81 mg by mouth daily.    . carvedilol (COREG) 3.125 MG tablet TAKE 1 TABLET BY MOUTH TWICE A DAY 180 tablet 3  . ezetimibe (ZETIA) 10 MG tablet Take 1 tablet (10 mg total) by mouth daily. 30 tablet 3  . isosorbide mononitrate (IMDUR) 30 MG 24 hr tablet TAKE ONE TABLET BY MOUTH EVERY NIGHT AT BEDTIME 30 tablet 1  . lisinopril (PRINIVIL,ZESTRIL) 5 MG tablet TAKE 1 TABLET BY MOUTH DAILY 30 tablet 3  . mirtazapine (REMERON) 15 MG tablet TAKE ONE TABLET BY MOUTH AT BEDTIME AS NEEDED FOR SLEEP 90 tablet 1  . naproxen sodium (ALEVE) 220 MG tablet Take 220 mg by mouth daily as needed (pain).    . nitroGLYCERIN (NITROSTAT) 0.4 MG SL tablet Place 1 tablet (0.4 mg total) under the tongue every 5 (five) minutes as needed for chest pain. 25 tablet 6  . omeprazole (PRILOSEC OTC) 20 MG tablet Take 20 mg by mouth daily.    . prasugrel (EFFIENT) 10 MG TABS tablet Take 1 tablet (10 mg total) by mouth daily. 90 each 3  . rosuvastatin (CRESTOR) 40 MG tablet Take 1 tablet (40 mg total) by mouth daily. 30 tablet 11  . triamcinolone cream (KENALOG) 0.1 % Apply 1 application topically 2 (two) times daily. (Patient not taking: Reported on 02/09/2016) 30 g 0   No current  facility-administered medications for this visit.      Allergies:   Metoprolol tartrate   Social History:  The patient  reports that he quit smoking about 22 years ago. His smoking use included Cigarettes. He has a 75.00 pack-year smoking history. He has never used smokeless tobacco. He reports that he drinks about 8.4 oz of alcohol per week . He reports that he does not use drugs.   Family History:   family history includes Coronary artery disease in his brother, father, mother, and sister; Heart attack in his father, maternal grandfather, and mother; Hypertension in his mother.    Review of Systems: Review of Systems  Constitutional: Negative.   HENT:       Dental problems  Respiratory: Negative.   Cardiovascular: Negative.   Gastrointestinal: Negative.   Musculoskeletal: Negative.   Neurological: Negative.   Psychiatric/Behavioral: Negative.   All other systems reviewed and are negative.  PHYSICAL EXAM: VS:  BP (!) 144/86 (BP Location: Left Arm, Patient Position: Sitting, Cuff Size: Normal)   Pulse 60   Ht 5\' 4"  (1.626 m)   Wt 161 lb 8 oz (73.3 kg)   BMI 27.72 kg/m  , BMI Body mass index is 27.72 kg/m. GEN: Well nourished, well developed, in no acute distress  HEENT: normal  Neck: no JVD, carotid bruits, or masses Cardiac: RRR; no murmurs, rubs, or gallops,no edema  Respiratory:  clear to auscultation bilaterally, normal work of breathing GI: soft, nontender, nondistended, + BS MS: no deformity or atrophy  Skin: warm and dry, no rash Neuro:  Strength and sensation are intact Psych: euthymic mood, full affect    Recent Labs: 08/11/2015: ALT 26 01/05/2016: BUN 4; Creatinine, Ser 0.78; Hemoglobin 13.9; Platelets 307.0; Potassium 4.8; Sodium 132    Lipid Panel Lab Results  Component Value Date   CHOL 163 08/11/2015   HDL 46 08/11/2015   LDLCALC 72 08/11/2015   TRIG 225 (H) 08/11/2015      Wt Readings from Last 3 Encounters:  02/09/16 161 lb 8 oz (73.3 kg)   01/05/16 156 lb (70.8 kg)  11/11/15 151 lb 12.8 oz (68.9 kg)       ASSESSMENT AND PLAN:  SOB (shortness of breath) on exertion - Plan: EKG 12-Lead Reports breathing is stable, no further workup at this time  Hyperlipidemia LDL goal <70 Cholesterol is at goal on the current lipid regimen. No changes to the medications were made.  Essential hypertension Blood pressure is well controlled on today's visit. No changes made to the medications.  Atherosclerosis of native coronary artery of native heart with angina pectoris (HCC) , No further testing  AAA (abdominal aortic aneurysm) without rupture (HCC) Repeat ultrasound on an annual basis  Renal artery stenosis ([place]) Ultrasound as above on annual basis  Coronary artery disease involving coronary bypass graft of native heart with unstable angina pectoris ([place])  Dental pain Needs to have all of his teeth pulled, unable to afford it at this time Discussed options with his blood thinners    Total encounter time more than 25 minutes  Greater than 50% was spent in counseling and coordination of care with the patient   Disposition:   F/U  6 months   Orders Placed This Encounter  Procedures  . EKG 12-Lead     Signed, Esmond Plants, M.D., Ph.D. 02/09/2016  [place], [place]

## 2016-02-09 NOTE — Patient Instructions (Addendum)

## 2016-02-10 ENCOUNTER — Other Ambulatory Visit: Payer: Self-pay | Admitting: Cardiovascular Disease

## 2016-02-17 ENCOUNTER — Telehealth: Payer: Self-pay | Admitting: Cardiovascular Disease

## 2016-02-17 NOTE — Telephone Encounter (Signed)
Pt states, due to an increase in his insurance would like to consider taking Plavix. Please call.

## 2016-02-17 NOTE — Telephone Encounter (Signed)
Patient states that there was a change in his policy and now the effient is costing him $200.00 a month. He wants to switch to plavix which he reports was discussed at his last office visit and is significantly cheaper for him. Let him know that I would check with Dr. Rockey Situ and call him back when I send in prescription. He requested 90 day prescription if ordered. He was so appreciative for the quick call back and had no further questions at this time.

## 2016-02-18 MED ORDER — CLOPIDOGREL BISULFATE 75 MG PO TABS: 75.0000 mg | ORAL_TABLET | Freq: Every day | ORAL | 3 refills | Status: DC

## 2016-02-18 NOTE — Telephone Encounter (Signed)
Okay to change to Plavix 75 mg daily Hold the effient

## 2016-02-18 NOTE — Telephone Encounter (Signed)
Spoke w/ pt's wife.  Advised her of Dr. Donivan Scull recommendation.  She is appreciative and will call back w/ any further questions or concerns.

## 2016-02-19 ENCOUNTER — Telehealth: Payer: Self-pay | Admitting: Cardiovascular Disease

## 2016-02-19 NOTE — Telephone Encounter (Signed)
Pharmacy calling wanting to clarify some medication changes   The patient has been on Effiient   They received a prescription for Plavix  Would like to know if patient is to take both or just one  And if so which one  Please advise.

## 2016-02-19 NOTE — Telephone Encounter (Signed)
Minna Merritts, MD  to Stana Bunting, RN     5:53 PM  Note   Okay to change to Plavix 75 mg daily Hold the effient

## 2016-02-19 NOTE — Telephone Encounter (Signed)
I spoke with Pharmacy and confirmed medication & correct dosage.

## 2016-03-09 ENCOUNTER — Other Ambulatory Visit: Payer: Self-pay | Admitting: Cardiovascular Disease

## 2016-03-12 ENCOUNTER — Other Ambulatory Visit: Payer: Self-pay | Admitting: Cardiovascular Disease

## 2016-03-25 ENCOUNTER — Other Ambulatory Visit: Payer: Self-pay | Admitting: Internal Medicine

## 2016-04-06 ENCOUNTER — Other Ambulatory Visit: Payer: Self-pay | Admitting: Cardiovascular Disease

## 2016-04-06 ENCOUNTER — Ambulatory Visit: Payer: Medicare Other

## 2016-04-06 DIAGNOSIS — I714 Abdominal aortic aneurysm, without rupture, unspecified: Secondary | ICD-10-CM

## 2016-04-06 DIAGNOSIS — I719 Aortic aneurysm of unspecified site, without rupture: Secondary | ICD-10-CM | POA: Diagnosis not present

## 2016-04-09 ENCOUNTER — Other Ambulatory Visit: Payer: Self-pay | Admitting: Cardiovascular Disease

## 2016-04-11 ENCOUNTER — Encounter: Payer: Self-pay | Admitting: Internal Medicine

## 2016-04-12 MED ORDER — AMOXICILLIN-POT CLAVULANATE 875-125 MG PO TABS: 1.0000 | ORAL_TABLET | Freq: Two times a day (BID) | ORAL | 0 refills | Status: AC

## 2016-05-28 ENCOUNTER — Encounter: Payer: Self-pay | Admitting: Internal Medicine

## 2016-06-12 ENCOUNTER — Other Ambulatory Visit: Payer: Self-pay | Admitting: Cardiovascular Disease

## 2016-06-21 ENCOUNTER — Other Ambulatory Visit: Payer: Self-pay | Admitting: Internal Medicine

## 2016-08-09 ENCOUNTER — Other Ambulatory Visit: Payer: Self-pay | Admitting: Cardiovascular Disease

## 2016-08-18 NOTE — Progress Notes (Signed)
Cardiology Office Note  Date:  08/19/2016   ID:  [name], DOB [birth year], MRN [id number]  PCP:  Viviana Simpler, MD   Chief Complaint  Patient presents with  . other    6 month f/u c/o fatigue and dizziness . Meds reviewed verbally with pt.    HPI:   [name] is a 66 year old gentleman with  coronary artery disease, bypass surgery in 1995,  stenting at [facility] in February 2004 for MI with a 3.0 x 25 mm and 4.5 x 16 mm Monorail ( location uncertain), also 4.5 x 18 mm stent placed to the mid RCA, followup with repeat stenting in April 2005 to the proximal LAD with a Taxus stent 2.5 x 8 mm, and stenting to the proximal left circumflex with a 2.5 x 12 mm Taxus, with  stent placed November 13, 2010 with a 4.0 x 9 mm stent placed to the left main. renal artery stent. Abdominal aortic aneurysm 3.2 cm Prior smoking history  He presents for routine followup of his coronary artery disease.  Orthostatic at home Drop in pressures in the office Today, positive orthostatics Blood pressure 128/78 supine 124/82 sitting 102/76 standing With mild recovery after 3 minutes  Dizzy Episodes when he stands up at home Seem to start over the past 3 days denies any illness, not dehydrated Has been painting in the house but does not think it is from the fumes  Denies any significant chest pain Occasional fluttering left chest but feels it is gas  Labs 07/2015 reviewed with him  Total chol 163, LDL 72  EKG personally reviewed by myself on todays visit Shows normal sinus rhythm with rate 55 bpm T-wave abnormality, no change from previous EKGs  Other past medical history reviewed  Severe problems with his teeth Did not have teeth worked on, 13K dollars locally Did not have the money   chest pain 04/05/2014, went to [facility] head catheterization. This showed an occluded OM vessel with DES placed 2 Occluded mid LAD with a patent LIMA to the LAD, patent left circumflex, prior  bare-metal stent into the OM1 was occluded in the distal portion just after the ostium of OM1. Occluded RCA, ejection fraction 35%  Previous episode of chest pain while in [place] with hospitalization at that time, negative stress test, 2013 recurrent chest pain requiring admission to [facility], repeat catheterization performed 12/2011 This showed patent stents to the left circumflex and LAD with patent LIMA to the LAD. Vein graft to the OM and vein graft to the RCA was occluded  admission to [place] at the beginning of July 2014. Notes from the hospital were reviewed. Ruled out for MI with negative cardiac enzymes, CT scan chest showing no PE. He had stress test 10/25/2012 showing old inferior wall MI with defect noted in the basal to mid inferior wall. Discharged on tramadol.   Ultrasound of his aorta in [place] 12/22/2011 showed distal aorta 3 cm, iliacs of normal size Previous lower extremity ultrasound  showed no significant disease  He used to be a smoker though stopped in 1995. Prior to that he smoked 3 packs per day. Smoked for approximately 25-28 years    PMH:   has a past medical history of AAA (abdominal aortic aneurysm) ([place]); CAD (coronary artery disease); Diverticulitis; GERD (gastroesophageal reflux disease); Hyperlipidemia; Hypertension; and Renal artery stenosis ([place]).  PSH:    Past Surgical History:  Procedure Laterality Date  . ANGIOPLASTY  1997  . CARDIAC CATHETERIZATION  8/09  Cath--vein graft occlusions which are old--no acute changes  . CARDIAC CATHETERIZATION  11/13/2010   stent x 2 @ [place]  . CARDIAC CATHETERIZATION  04/05/2014   stent placement   . CLOSED REDUCTION SHOULDER DISLOCATION    . CORONARY ANGIOPLASTY  04/05/2014   stent placement OM 1  . CORONARY ARTERY BYPASS GRAFT    . CORONARY STENT PLACEMENT  7/12   2 stents--Promus element plus (everolimus eluting)--[facility] in [place]  . LEFT HEART CATHETERIZATION WITH CORONARY/GRAFT  ANGIOGRAM N/A 01/04/2012   Procedure: LEFT HEART CATHETERIZATION WITH Beatrix Fetters;  Surgeon: Burnell Blanks, MD;  Location: [facility] CATH LAB;  Service: Cardiovascular;  Laterality: N/A;  . LEFT HEART CATHETERIZATION WITH CORONARY/GRAFT ANGIOGRAM N/A 04/05/2014   Procedure: LEFT HEART CATHETERIZATION WITH Beatrix Fetters;  Surgeon: Jettie Booze, MD;  Location: [facility] CATH LAB;  Service: Cardiovascular;  Laterality: N/A;  . PERCUTANEOUS CORONARY STENT INTERVENTION (PCI-S)  04/05/2014   Procedure: PERCUTANEOUS CORONARY STENT INTERVENTION (PCI-S);  Surgeon: Jettie Booze, MD;  Location: [facility] CATH LAB;  Service: Cardiovascular;;  OM1  . RENAL ANGIOGRAM N/A 04/16/2011   Procedure: RENAL ANGIOGRAM;  Surgeon: Burnell Blanks, MD;  Location: [facility] CATH LAB;  Service: Cardiovascular;  Laterality: N/A;  . RENAL ARTERY STENT  03/2011 ?    Current Outpatient Prescriptions  Medication Sig Dispense Refill  . aspirin 81 MG tablet Take 81 mg by mouth daily.    . carvedilol (COREG) 3.125 MG tablet TAKE 1 TABLET BY MOUTH TWICE A DAY 180 tablet 3  . clopidogrel (PLAVIX) 75 MG tablet Take 1 tablet (75 mg total) by mouth daily. 90 tablet 3  . ezetimibe (ZETIA) 10 MG tablet TAKE 1 TABLET BY MOUTH DAILY 30 tablet 3  . isosorbide mononitrate (IMDUR) 30 MG 24 hr tablet TAKE ONE TABLET BY MOUTH EVERY NIGHT AT BEDTIME 30 tablet 5  . lisinopril (PRINIVIL,ZESTRIL) 5 MG tablet TAKE 1 TABLET BY MOUTH DAILY 30 tablet 0  . mirtazapine (REMERON) 15 MG tablet TAKE 1 TABLET BY MOUTH EVERY NIGHT AT BEDTIME FOR SLEEP. 90 tablet 3  . naproxen sodium (ALEVE) 220 MG tablet Take 220 mg by mouth daily as needed (pain).    . nitroGLYCERIN (NITROSTAT) 0.4 MG SL tablet Place 1 tablet (0.4 mg total) under the tongue every 5 (five) minutes as needed for chest pain. 25 tablet 6  . omeprazole (PRILOSEC OTC) 20 MG tablet Take 20 mg by mouth daily.    . rosuvastatin (CRESTOR) 40 MG tablet TAKE 1 TABLET BY MOUTH  DAILY 30 tablet 6   No current facility-administered medications for this visit.      Allergies:   Metoclopramide and Metoprolol tartrate   Social History:  The patient  reports that he quit smoking about 23 years ago. His smoking use included Cigarettes. He has a 75.00 pack-year smoking history. He has never used smokeless tobacco. He reports that he drinks about 8.4 oz of alcohol per week . He reports that he does not use drugs.   Family History:   family history includes Coronary artery disease in his brother, father, mother, and sister; Heart attack in his father, maternal grandfather, and mother; Hypertension in his mother.    Review of Systems: Review of Systems  Constitutional: Negative.   HENT:       Dental problems  Respiratory: Negative.   Cardiovascular: Negative.   Gastrointestinal: Negative.   Musculoskeletal: Negative.   Neurological: Positive for dizziness.  Lightheaded  Psychiatric/Behavioral: Negative.   All other systems reviewed and are negative.    PHYSICAL EXAM: VS:  BP 130/80 (BP Location: Left Arm, Patient Position: Sitting, Cuff Size: Normal)   Pulse (!) 55   Ht 5\' 5"  (1.651 m)   Wt 166 lb 4 oz (75.4 kg)   BMI 27.67 kg/m  , BMI Body mass index is 27.67 kg/m. GEN: Well nourished, well developed, in no acute distress  HEENT: normal  Neck: no JVD, carotid bruits, or masses Cardiac: RRR; no murmurs, rubs, or gallops,no edema  Respiratory:  clear to auscultation bilaterally, normal work of breathing GI: soft, nontender, nondistended, + BS MS: no deformity or atrophy  Skin: warm and dry, no rash Neuro:  Strength and sensation are intact Psych: euthymic mood, full affect    Recent Labs: 01/05/2016: BUN 4; Creatinine, Ser 0.78; Hemoglobin 13.9; Platelets 307.0; Potassium 4.8; Sodium 132    Lipid Panel Lab Results  Component Value Date   CHOL 163 08/11/2015   HDL 46 08/11/2015   LDLCALC 72 08/11/2015   TRIG 225 (H) 08/11/2015      Wt  Readings from Last 3 Encounters:  08/19/16 166 lb 4 oz (75.4 kg)  02/09/16 161 lb 8 oz (73.3 kg)  01/05/16 156 lb (70.8 kg)       ASSESSMENT AND PLAN:  Dizziness/orthostasis Etiology unclear, we'll decrease isosorbide in half Recommended he increase fluid and salt intake If symptoms persist recommended we could consider event monitor to rule out arrhythmia Suggested he call us if he does have persistent symptoms  SOB (shortness of breath) on exertion - Plan: EKG 12-Lead Reports breathing is stable Prior smoking history  Hyperlipidemia LDL goal <70 Cholesterol is close to  goal on the current lipid regimen. No changes to the medications were made. He will do follow-up lipid panel with primary care September 2018   Essential hypertension Orthostatics are positive down 20 points with standing, some dizziness at home, worse recently as he has been painting up and down ladder. Recommended he increase hydration, salt (he has been very strict) Would cut isosorbide in half down to 15 mg daily  Atherosclerosis of native coronary artery of native heart with angina pectoris (HCC) No further testing, denies having anginal symptoms  AAA (abdominal aortic aneurysm) without rupture ([place]) Repeat ultrasound on an annual basis 3.7 cm December 2017 Discussed previous results with him  Renal artery stenosis ([place]) Ultrasound as above on annual basis   Coronary artery disease involving coronary bypass graft of native heart with unstable angina pectoris ([place]) Currently with no symptoms of angina. No further workup at this time. Continue current medication regimen.   Dental pain Chronic issue    Total encounter time more than 25 minutes  Greater than 50% was spent in counseling and coordination of care with the patient   Disposition:   F/U  6 months   Orders Placed This Encounter  Procedures  . EKG 12-Lead     Signed, Esmond Plants, M.D., Ph.D. 08/19/2016  [place], [place]

## 2016-08-19 ENCOUNTER — Ambulatory Visit (INDEPENDENT_AMBULATORY_CARE_PROVIDER_SITE_OTHER): Payer: Medicare Other | Admitting: Cardiovascular Disease

## 2016-08-19 ENCOUNTER — Encounter: Payer: Self-pay | Admitting: Cardiovascular Disease

## 2016-08-19 VITALS — BP 130/80 | HR 55 | Ht 65.0 in | Wt 166.2 lb

## 2016-08-19 DIAGNOSIS — I10 Essential (primary) hypertension: Secondary | ICD-10-CM | POA: Diagnosis not present

## 2016-08-19 DIAGNOSIS — I714 Abdominal aortic aneurysm, without rupture, unspecified: Secondary | ICD-10-CM

## 2016-08-19 DIAGNOSIS — E785 Hyperlipidemia, unspecified: Secondary | ICD-10-CM

## 2016-08-19 DIAGNOSIS — I209 Angina pectoris, unspecified: Secondary | ICD-10-CM | POA: Diagnosis not present

## 2016-08-19 DIAGNOSIS — I951 Orthostatic hypotension: Secondary | ICD-10-CM

## 2016-08-19 DIAGNOSIS — I25119 Atherosclerotic heart disease of native coronary artery with unspecified angina pectoris: Secondary | ICD-10-CM | POA: Diagnosis not present

## 2016-08-19 DIAGNOSIS — I701 Atherosclerosis of renal artery: Secondary | ICD-10-CM

## 2016-08-19 DIAGNOSIS — I959 Hypotension, unspecified: Secondary | ICD-10-CM | POA: Insufficient documentation

## 2016-08-19 NOTE — Patient Instructions (Signed)
Medication Instructions:   Please cut the isosorbide in 1/2 daily (pm)  If you continue to have lightheadedness, Call the office  Labwork:  No new labs needed  Testing/Procedures:  No further testing at this time   I recommend watching educational videos on topics of interest to you at:       [email or web address].goemmi.com  Enter code: [email or web address]    Follow-Up: It was a pleasure seeing you in the office today. Please call us if you have new issues that need to be addressed before your next appt.  [phone]  Your physician wants you to follow-up in: 6 months.  You will receive a reminder letter in the mail two months in advance. If you don't receive a letter, please call our office to schedule the follow-up appointment.  If you need a refill on your cardiac medications before your next appointment, please call your pharmacy.

## 2016-09-12 ENCOUNTER — Other Ambulatory Visit: Payer: Self-pay | Admitting: Cardiovascular Disease

## 2016-09-14 IMAGING — CR DG CHEST 1V PORT
1 series · 1 of 1 positions shown · non-contrast
Comparison: 10/25/2012

CLINICAL DATA: Short of breath and chest pain

EXAM:
PORTABLE CHEST - 1 VIEW

[portable]
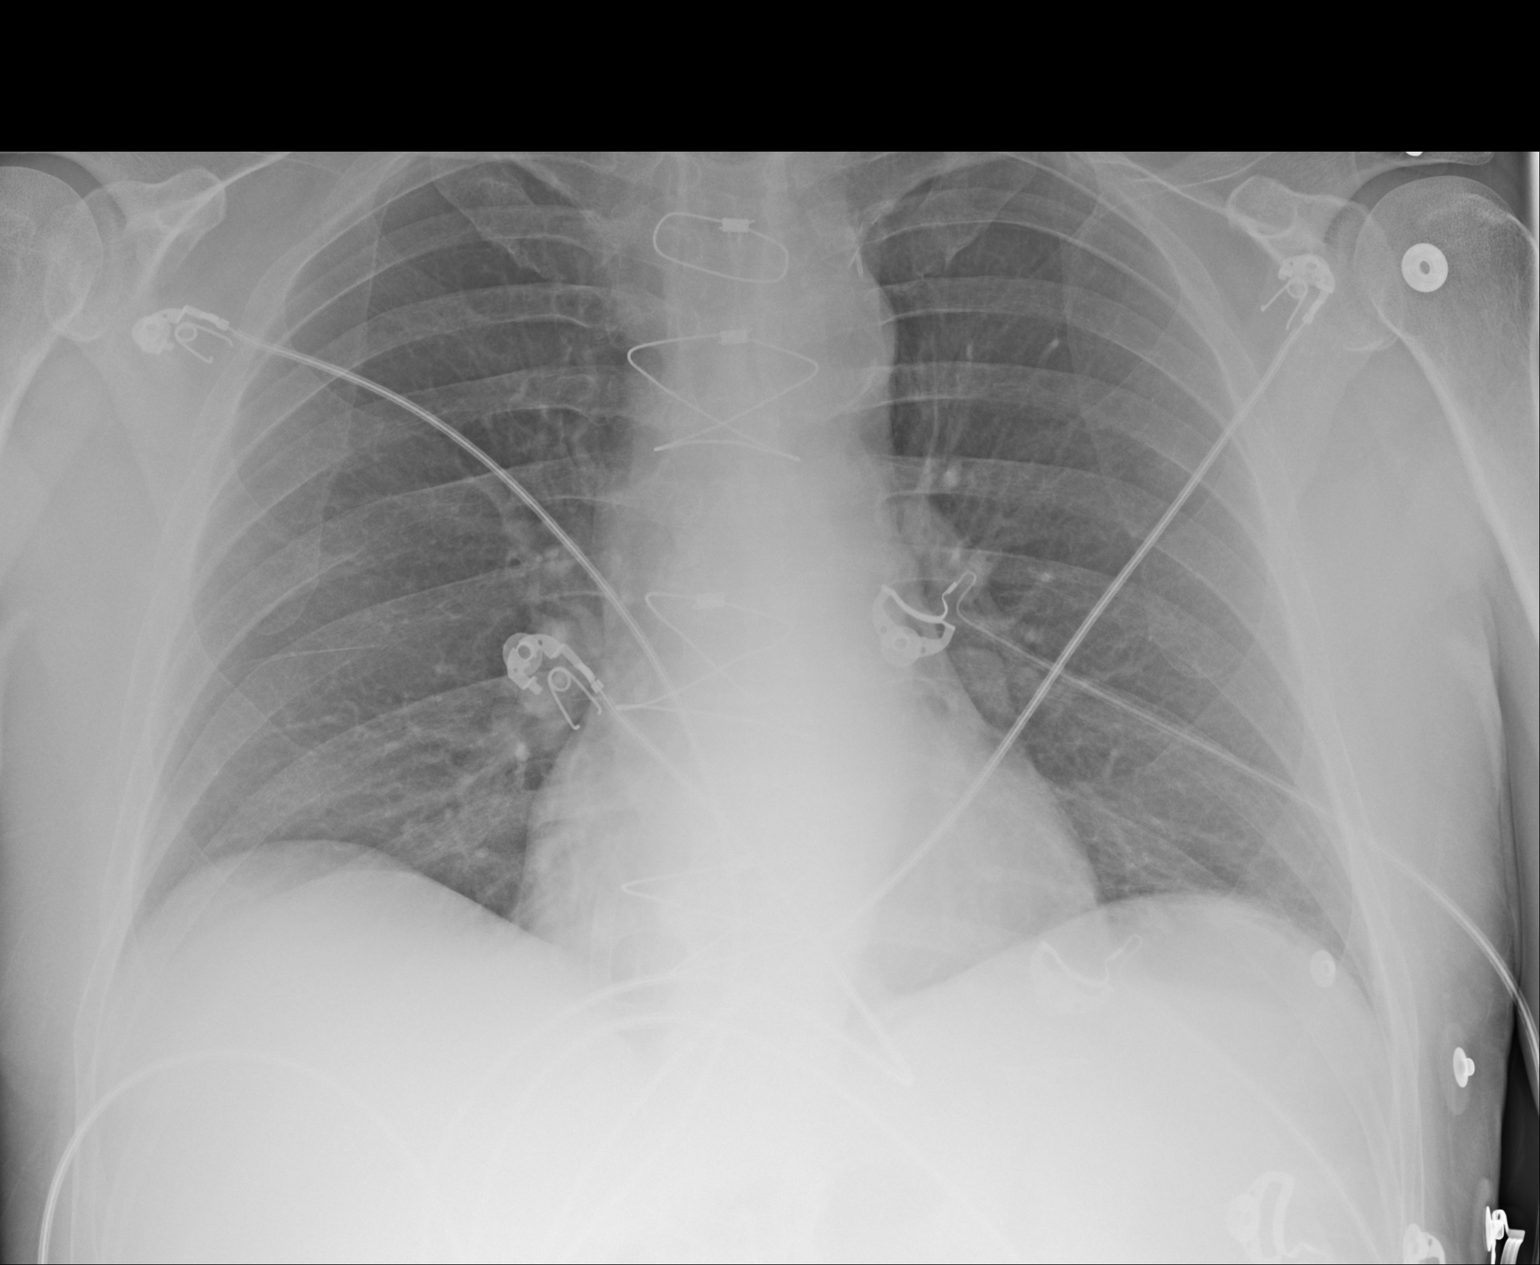

[1 of 1 positions shown; findings below may reference images not displayed]

FINDINGS: The heart size and mediastinal contours are within normal limits.
Both lungs are clear. The visualized skeletal structures are
unremarkable.
IMPRESSION: No active disease.

## 2016-10-18 ENCOUNTER — Other Ambulatory Visit: Payer: Self-pay | Admitting: Cardiovascular Disease

## 2016-10-21 ENCOUNTER — Encounter: Payer: Self-pay | Admitting: Internal Medicine

## 2016-10-21 MED ORDER — AMOXICILLIN-POT CLAVULANATE 875-125 MG PO TABS: 1.0000 | ORAL_TABLET | Freq: Two times a day (BID) | ORAL | 0 refills | Status: AC

## 2016-11-05 ENCOUNTER — Other Ambulatory Visit: Payer: Self-pay | Admitting: Cardiovascular Disease

## 2016-12-12 ENCOUNTER — Other Ambulatory Visit: Payer: Self-pay | Admitting: Cardiovascular Disease

## 2017-01-07 ENCOUNTER — Ambulatory Visit (INDEPENDENT_AMBULATORY_CARE_PROVIDER_SITE_OTHER): Payer: Medicare Other | Admitting: Internal Medicine

## 2017-01-07 ENCOUNTER — Encounter: Payer: Self-pay | Admitting: Internal Medicine

## 2017-01-07 VITALS — BP 136/90 | HR 60 | Temp 98.0°F | Ht 64.0 in | Wt 163.5 lb

## 2017-01-07 DIAGNOSIS — G479 Sleep disorder, unspecified: Secondary | ICD-10-CM | POA: Diagnosis not present

## 2017-01-07 DIAGNOSIS — I701 Atherosclerosis of renal artery: Secondary | ICD-10-CM | POA: Diagnosis not present

## 2017-01-07 DIAGNOSIS — Z0000 Encounter for general adult medical examination without abnormal findings: Secondary | ICD-10-CM | POA: Diagnosis not present

## 2017-01-07 DIAGNOSIS — I25119 Atherosclerotic heart disease of native coronary artery with unspecified angina pectoris: Secondary | ICD-10-CM | POA: Diagnosis not present

## 2017-01-07 DIAGNOSIS — Z7189 Other specified counseling: Secondary | ICD-10-CM

## 2017-01-07 DIAGNOSIS — I739 Peripheral vascular disease, unspecified: Secondary | ICD-10-CM | POA: Insufficient documentation

## 2017-01-07 DIAGNOSIS — Z23 Encounter for immunization: Secondary | ICD-10-CM | POA: Diagnosis not present

## 2017-01-07 LAB — CBC WITH DIFFERENTIAL/PLATELET
BASOS PCT: 1.7 %
Basophils Absolute: 99 cells/uL (ref 0–200)
EOS PCT: 4.3 %
Eosinophils Absolute: 249 cells/uL (ref 15–500)
HEMATOCRIT: 40 % (ref 38.5–50.0)
HEMOGLOBIN: 13.6 g/dL (ref 13.2–17.1)
LYMPHS ABS: 1879 {cells}/uL (ref 850–3900)
MCH: 31.9 pg (ref 27.0–33.0)
MCHC: 34 g/dL (ref 32.0–36.0)
MCV: 93.9 fL (ref 80.0–100.0)
MPV: 9.2 fL (ref 7.5–12.5)
Monocytes Relative: 14 %
NEUTROS ABS: 2761 {cells}/uL (ref 1500–7800)
NEUTROS PCT: 47.6 %
Platelets: 276 10*3/uL (ref 140–400)
RBC: 4.26 10*6/uL (ref 4.20–5.80)
RDW: 14.8 % (ref 11.0–15.0)
Total Lymphocyte: 32.4 %
WBC mixed population: 812 cells/uL (ref 200–950)
WBC: 5.8 10*3/uL (ref 3.8–10.8)

## 2017-01-07 LAB — COMPREHENSIVE METABOLIC PANEL
AG Ratio: 1.3 (calc) (ref 1.0–2.5)
ALBUMIN MSPROF: 3.8 g/dL (ref 3.6–5.1)
ALKALINE PHOSPHATASE (APISO): 51 U/L (ref 40–115)
ALT: 24 U/L (ref 9–46)
AST: 36 U/L — ABNORMAL HIGH (ref 10–35)
BUN/Creatinine Ratio: 5 (calc) — ABNORMAL LOW (ref 6–22)
BUN: 4 mg/dL — AB (ref 7–25)
CO2: 25 mmol/L (ref 20–32)
CREATININE: 0.84 mg/dL (ref 0.70–1.25)
Calcium: 9.4 mg/dL (ref 8.6–10.3)
Chloride: 100 mmol/L (ref 98–110)
Globulin: 3 g/dL (calc) (ref 1.9–3.7)
Glucose, Bld: 100 mg/dL — ABNORMAL HIGH (ref 65–99)
POTASSIUM: 4.6 mmol/L (ref 3.5–5.3)
SODIUM: 134 mmol/L — AB (ref 135–146)
TOTAL PROTEIN: 6.8 g/dL (ref 6.1–8.1)
Total Bilirubin: 0.5 mg/dL (ref 0.2–1.2)

## 2017-01-07 LAB — LIPID PANEL
Cholesterol: 141 mg/dL (ref <200)
HDL: 34 mg/dL — AB (ref >40)
LDL CHOLESTEROL (CALC): 63 mg/dL
Non-HDL Cholesterol (Calc): 107 mg/dL (calc) (ref <130)
TRIGLYCERIDES: 362 mg/dL — AB (ref <150)
Total CHOL/HDL Ratio: 4.1 (calc) (ref <5.0)

## 2017-01-07 NOTE — Progress Notes (Signed)
Subjective:    Patient ID: [name], male    DOB: 06-25-1950, 66 y.o.   MRN: [id number]  HPI Here for Medicare wellness visit and follow up of chronic health conditions Reviewed form and advanced directives Reviewed other doctors Still enjoys 3-4 beers daily No exercise--discussed. Does use cycling machine (just the pedals)--but tires quicklly No tobacco now No depression or anhedonia Vision and hearing are good No falls  independent with instrumental ADLs No apparent memory problems  Doing okay Recent cardiology follow up Was having orthostatic symptoms--cut the isosorbide in half Symptoms are better Does have DOE--especially hills--but no chest pain No syncope No edema--wears compression stockings most days to prevent swelling  Has some leg pain at night---affected his sleep RLS type symptoms Uses the mirtazapine only prn-- 2-3 times per week  Takes prilosec regularly Gets symptoms if he holds it No dysphagia  Did have ultrasound for AAA Slight enlargement--will check yearly  Known renal artery stenosis Was due for ultrasound last year--but may have been seen on aorta check  Current Outpatient Prescriptions on File Prior to Visit  Medication Sig Dispense Refill  . aspirin 81 MG tablet Take 81 mg by mouth daily.    . carvedilol (COREG) 3.125 MG tablet TAKE 1 TABLET BY MOUTH TWICE A DAY 180 tablet 3  . clopidogrel (PLAVIX) 75 MG tablet Take 1 tablet (75 mg total) by mouth daily. 90 tablet 3  . ezetimibe (ZETIA) 10 MG tablet TAKE 1 TABLET BY MOUTH DAILY 30 tablet 3  . isosorbide mononitrate (IMDUR) 30 MG 24 hr tablet Take 0.5 tablets (15 mg total) by mouth at bedtime. 15 tablet 3  . lisinopril (PRINIVIL,ZESTRIL) 5 MG tablet TAKE 1 TABLET BY MOUTH DAILY 30 tablet 3  . mirtazapine (REMERON) 15 MG tablet TAKE 1 TABLET BY MOUTH EVERY NIGHT AT BEDTIME FOR SLEEP. 90 tablet 3  . naproxen sodium (ALEVE) 220 MG tablet Take 220 mg by mouth daily as needed (pain).    .  nitroGLYCERIN (NITROSTAT) 0.4 MG SL tablet Place 1 tablet (0.4 mg total) under the tongue every 5 (five) minutes as needed for chest pain. 25 tablet 6  . omeprazole (PRILOSEC OTC) 20 MG tablet Take 20 mg by mouth daily.    . rosuvastatin (CRESTOR) 40 MG tablet TAKE 1 TABLET BY MOUTH DAILY 30 tablet 3   No current facility-administered medications on file prior to visit.     Allergies  Allergen Reactions  . Metoclopramide     Other reaction(s): Unknown  . Metoprolol Tartrate Hives and Itching    Past Medical History:  Diagnosis Date  . AAA (abdominal aortic aneurysm) ([place])    a. Duplex 01/2012: stable infrarenal saccular AAA at 3.3cm x 3.2xm (f/u recommended 01/2013 per Dr. Rockey Situ)  . CAD (coronary artery disease)    a. 1995 s/p CABG x 4: LIMA->LAD, VG->RI (known to be occluded), VG->AM->PDA;  b. 05/2002 Inf STEMI: VG->AM->PDA 100% treated w/ 2 BMS complicated by acute thrombosis req 3 BMS;  c. 07/2003 DES to  LAD & LCX (VG's to PDA & RI 100%);  d. 12/2010 Acute MI ([place]): DES to LCX & LM , LIMA ok,;  e.12/2011 Cath: LM/LCX stents ok , LIMA patent.  . Diverticulitis    1/06 Diverticulitis--CT of pelvis--diffuse sigmoid divertic  . GERD (gastroesophageal reflux disease)   . Hyperlipidemia   . Hypertension   . Renal artery stenosis ([place])    a. 03/2011 PTA and stenting of L RA. b. 01/2012 renal duplex -  stable 1-59% R renal artery stenosis, normal L renal artery s/p stent.    Past Surgical History:  Procedure Laterality Date  . ANGIOPLASTY  1997  . CARDIAC CATHETERIZATION     8/09  Cath--vein graft occlusions which are old--no acute changes  . CARDIAC CATHETERIZATION  11/13/2010   stent x 2 @ [place]  . CARDIAC CATHETERIZATION  04/05/2014   stent placement   . CLOSED REDUCTION SHOULDER DISLOCATION    . CORONARY ANGIOPLASTY  04/05/2014   stent placement OM 1  . CORONARY ARTERY BYPASS GRAFT    . CORONARY STENT PLACEMENT  7/12   2 stents--Promus element plus (everolimus eluting)--[facility] [place]  . LEFT HEART CATHETERIZATION WITH CORONARY/GRAFT ANGIOGRAM N/A 01/04/2012   Procedure: LEFT HEART CATHETERIZATION WITH Beatrix Fetters;  Surgeon: Burnell Blanks, MD;  Location: [facility] CATH LAB;  Service: Cardiovascular;  Laterality: N/A;  . LEFT HEART CATHETERIZATION WITH CORONARY/GRAFT ANGIOGRAM N/A 04/05/2014   Procedure: LEFT HEART CATHETERIZATION WITH Beatrix Fetters;  Surgeon: Jettie Booze, MD;  Location: [facility] CATH LAB;  Service: Cardiovascular;  Laterality: N/A;  . PERCUTANEOUS CORONARY STENT INTERVENTION (PCI-S)  04/05/2014   Procedure: PERCUTANEOUS CORONARY STENT INTERVENTION (PCI-S);  Surgeon: Jettie Booze, MD;  Location: [facility] CATH LAB;  Service: Cardiovascular;;  OM1  . RENAL ANGIOGRAM N/A 04/16/2011   Procedure: RENAL ANGIOGRAM;  Surgeon: Burnell Blanks, MD;  Location: [facility] CATH LAB;  Service: Cardiovascular;  Laterality: N/A;  . RENAL ARTERY STENT  03/2011 ?    Family History  Problem Relation Age of Onset  . Coronary artery disease Mother        Died MI age 4  . Hypertension Mother   . Heart attack Mother   . Coronary artery disease Sister        Living  . Coronary artery disease Brother        Living  . Coronary artery disease Father        Died MI age 66  . Heart attack Father   . Heart attack Maternal Grandfather   . Diabetes Neg Hx   . Cancer Neg Hx        prostate or colon    Social History   Social History  . Marital status: Married    Spouse name: N/A  . Number of children: 3  . Years of education: N/A   Occupational History  . Geneticist, molecular (Animal nutritionist)     Retired   Social History Main Topics  . Smoking status: Former Smoker    Packs/day: 3.00    Years: 25.00    Types: Cigarettes    Quit date: 05/28/1993  . Smokeless tobacco: Never Used  . Alcohol use 8.4 oz/week    14 Cans of beer per week  . Drug use: No  . Sexual activity: Not on file   Other Topics Concern  .  Not on file   Social History Narrative   No living will   No health care POA but requests wife--then children   Would accept resuscitation   Not sure about tube feeds   Review of Systems Chronic knee pain--limits his walking. Will rarely take aleve (4/month) Occasional leg pain at night--uses the aleve then also Bowels are fine. No blood Is looking into [facility] for extractions Sleeps okay Appetite is not great--no real change Weight is up a little though--sedentary Nocturia x 2 usually. No daytime problems or flow issues. No skin rash or suspicious lesions  No other joint problems other than knees Wears seat belt    Objective:   Physical Exam  Constitutional: He is oriented to person, place, and time. No distress.  HENT:  Mouth/Throat: Oropharynx is clear and moist. No oropharyngeal exudate.  Teeth are decayed  Neck: No thyromegaly present.  Cardiovascular: Normal rate, regular rhythm and normal heart sounds.  Exam reveals no gallop.   No murmur heard. Feet warm  No pulse on left, very faint on right foot  Pulmonary/Chest: Effort normal and breath sounds normal. No respiratory distress. He has no wheezes. He has no rales.  Abdominal: Soft. There is no tenderness.  Musculoskeletal: He exhibits no edema or tenderness.  Lymphadenopathy:    He has no cervical adenopathy.  Neurological: He is alert and oriented to person, place, and time.  President-- "Daisy Floro, Jennye Boroughs" [id number] Recall 3/3  Skin: No rash noted. No erythema.  Psychiatric: He has a normal mood and affect. His behavior is normal.          Assessment & Plan:

## 2017-01-07 NOTE — Assessment & Plan Note (Signed)
Will recheck labs Make sure renals are checked with aorta in December

## 2017-01-07 NOTE — Assessment & Plan Note (Signed)
I have personally reviewed the Medicare Annual Wellness questionnaire and have noted 1. The patient's medical and social history 2. Their use of alcohol, tobacco or illicit drugs 3. Their current medications and supplements 4. The patient's functional ability including ADL's, fall risks, home safety risks and hearing or visual             impairment. 5. Diet and physical activities 6. Evidence for depression or mood disorders  The patients weight, height, BMI and visual acuity have been recorded in the chart I have made referrals, counseling and provided education to the patient based review of the above and I have provided the pt with a written personalized care plan for preventive services.  I have provided you with a copy of your personalized plan for preventive services. Please take the time to review along with your updated medication list.  Doing fairly well Prefers no cancer screening Flu vaccine today Discussed increasing exercise

## 2017-01-07 NOTE — Assessment & Plan Note (Signed)
No claudication--limited by dyspnea On appropriate vascular regimen--no intervention unless has pain

## 2017-01-07 NOTE — Assessment & Plan Note (Signed)
See social history 

## 2017-01-07 NOTE — Assessment & Plan Note (Signed)
DOE seems to be anginal equivalent No worse on lower isosorbide dose On appropriate meds

## 2017-01-07 NOTE — Assessment & Plan Note (Signed)
RLS type picture that prn mirtazapine helps

## 2017-01-12 ENCOUNTER — Other Ambulatory Visit: Payer: Self-pay | Admitting: Cardiovascular Disease

## 2017-01-12 DIAGNOSIS — I714 Abdominal aortic aneurysm, without rupture, unspecified: Secondary | ICD-10-CM

## 2017-01-14 ENCOUNTER — Encounter: Payer: Self-pay | Admitting: Internal Medicine

## 2017-01-14 MED ORDER — AMOXICILLIN 500 MG PO CAPS: 500.0000 mg | ORAL_CAPSULE | Freq: Three times a day (TID) | ORAL | 0 refills | Status: DC

## 2017-01-14 NOTE — Telephone Encounter (Signed)
Saw PCP last week. plz notify I've sent in an antibiotic course for him.  Do recommend he be seen by dentist asap.

## 2017-01-14 NOTE — Telephone Encounter (Signed)
Wanted to make sure a provider agrees with my recommendation for the pt to be seen somewhere before getting an antibiotic.

## 2017-01-15 ENCOUNTER — Emergency Department
Admission: EM | Admit: 2017-01-15 | Discharge: 2017-01-15 | Disposition: A | Discharge status: Home / Self Care | Payer: Medicare Other | Attending: Emergency Medicine | Admitting: Emergency Medicine

## 2017-01-15 ENCOUNTER — Encounter: Payer: Self-pay | Admitting: Medical Oncology

## 2017-01-15 DIAGNOSIS — K047 Periapical abscess without sinus: Secondary | ICD-10-CM | POA: Insufficient documentation

## 2017-01-15 DIAGNOSIS — I25810 Atherosclerosis of coronary artery bypass graft(s) without angina pectoris: Secondary | ICD-10-CM | POA: Diagnosis not present

## 2017-01-15 DIAGNOSIS — Z87891 Personal history of nicotine dependence: Secondary | ICD-10-CM | POA: Insufficient documentation

## 2017-01-15 DIAGNOSIS — K0889 Other specified disorders of teeth and supporting structures: Secondary | ICD-10-CM | POA: Diagnosis present

## 2017-01-15 DIAGNOSIS — Z8679 Personal history of other diseases of the circulatory system: Secondary | ICD-10-CM | POA: Diagnosis not present

## 2017-01-15 DIAGNOSIS — I252 Old myocardial infarction: Secondary | ICD-10-CM | POA: Insufficient documentation

## 2017-01-15 DIAGNOSIS — I10 Essential (primary) hypertension: Secondary | ICD-10-CM | POA: Insufficient documentation

## 2017-01-15 DIAGNOSIS — Z7982 Long term (current) use of aspirin: Secondary | ICD-10-CM | POA: Diagnosis not present

## 2017-01-15 DIAGNOSIS — Z7901 Long term (current) use of anticoagulants: Secondary | ICD-10-CM | POA: Insufficient documentation

## 2017-01-15 MED ORDER — LIDOCAINE VISCOUS 2 % MT SOLN: 10.0000 mL | OROMUCOSAL | 0 refills | Status: DC | PRN

## 2017-01-15 MED ORDER — DEXAMETHASONE SODIUM PHOSPHATE 10 MG/ML IJ SOLN
10.0000 mg | Freq: Once | INTRAMUSCULAR | Status: AC
  Administered 2017-01-15: 10 mg via INTRAVENOUS
  Filled 2017-01-15: qty 1

## 2017-01-15 MED ORDER — LIDOCAINE VISCOUS 2 % MT SOLN
15.0000 mL | Freq: Once | OROMUCOSAL | Status: AC
  Administered 2017-01-15: 15 mL via OROMUCOSAL
  Filled 2017-01-15: qty 15

## 2017-01-15 MED ORDER — CLINDAMYCIN HCL 300 MG PO CAPS: 300.0000 mg | ORAL_CAPSULE | Freq: Four times a day (QID) | ORAL | 0 refills | Status: AC

## 2017-01-15 MED ORDER — DIPHENHYDRAMINE HCL 50 MG/ML IJ SOLN
25.0000 mg | Freq: Once | INTRAMUSCULAR | Status: AC
  Administered 2017-01-15: 25 mg via INTRAVENOUS
  Filled 2017-01-15: qty 1

## 2017-01-15 MED ORDER — OXYCODONE-ACETAMINOPHEN 5-325 MG PO TABS
1.0000 | ORAL_TABLET | Freq: Once | ORAL | Status: AC
  Administered 2017-01-15: 1 via ORAL
  Filled 2017-01-15: qty 1

## 2017-01-15 MED ORDER — CLINDAMYCIN PHOSPHATE 600 MG/50ML IV SOLN
600.0000 mg | Freq: Once | INTRAVENOUS | Status: AC
  Administered 2017-01-15: 600 mg via INTRAVENOUS
  Filled 2017-01-15: qty 50

## 2017-01-15 NOTE — ED Triage Notes (Signed)
Pt has rt sided dental pain with swelling that began Thursday.

## 2017-01-15 NOTE — Discharge Instructions (Signed)
OPTIONS FOR DENTAL FOLLOW UP CARE ° °[place] Department of Health and Human Services - Local Safety Net Dental Clinics °http://www.ncdhhs.gov/dph/oralhealth/services/safetynetclinics.htm °  °[facility] (336-562-3123) ° °[facility] [place] ([phone]) ° °[facility] ([phone] ext [phone]) ° °[facility] ([phone]) ° °[facility] ([phone]) °This clinic caters to the indigent population and is on a lottery system. °Location: °[facility], [address], [place] °Clinic Hours: °Wednesdays from 6pm - 9pm, patients seen by a lottery system. °For dates, call or go to [email or web address].[facility].edu/[facility]/patients/Dental-[facility] °Services: °Cleanings, fillings and simple extractions. °Payment Options: °DENTAL WORK IS FREE OF CHARGE. Bring proof of income or support. °Best way to get seen: °Arrive at 5:15 pm - this is a lottery, NOT first come/first serve, so arriving earlier will not increase your chances of being seen. °  °  °[facility] °[phone] °Select option 1 for emergencies °  °Location: °[facility], [address], [place] °Clinic Hours: °No walk-ins accepted - call the day before to schedule an appointment. °Check in times are 9:30 am and 1:30 pm. °Services: °Simple extractions, temporary fillings, pulpectomy/pulp debridement, uncomplicated abscess drainage. °Payment Options: °PAYMENT IS DUE AT THE TIME OF SERVICE.  Fee is usually $100-200, additional surgical procedures (e.g. abscess drainage) may be extra. °Cash, checks, Visa/MasterCard accepted.  Can file Medicaid if patient is covered for dental - patient should call case worker to check. °No discount for [facility] patients. °Best way to get seen: °MUST call the day before and get onto the schedule. Can usually be seen the next 1-2 days. No walk-ins accepted. °  °  °[facility] °[phone] °   °Location: °[facility], [address], [place] °Clinic Hours: °M, W, Th, F 8am or 1:30pm, Tues 9a or 1:30 - first come/first served. °Services: °Simple extractions, temporary fillings, uncomplicated abscess drainage.  You do not need to be an [place] resident. °Payment Options: °PAYMENT IS DUE AT THE TIME OF SERVICE. °Dental insurance, otherwise sliding scale - bring proof of income or support. °Depending on income and treatment needed, cost is usually $50-200. °Best way to get seen: °Arrive early as it is first come/first served. °  °  °[facility] °[phone] °  °Location: °[address] °Clinic Hours: °Mon-Thu 8a-5p °Services: °Most basic dental services including extractions and fillings. °Payment Options: °PAYMENT IS DUE AT THE TIME OF SERVICE. °Sliding scale, up to 50% off - bring proof if income or support. °Medicaid with dental option accepted. °Best way to get seen: °Call to schedule an appointment, can usually be seen within 2 weeks OR they will try to see walk-ins - show up at 8a or 2p (you may have to wait). °  °  °[facility] °[phone] °[place] RESIDENTS ONLY °  °Location: °[facility], [address], [place], [place] [address] °Clinic Hours: By appointment only. °Monday - Thursday 8am-5pm, Friday 8am-12pm °Services: Cleanings, fillings, extractions. °Payment Options: °PAYMENT IS DUE AT THE TIME OF SERVICE. °Cash, Visa or MasterCard. Sliding scale - $30 minimum per service. °Best way to get seen: °Come in to office, complete packet and make an appointment - need proof of income °or support monies for each household member and proof of [place] residence. °Usually takes about a month to get in. °  °  °[facility] °[phone] °  °Location: °[address]   [place] °Clinic Hours: Walk-in Urgent Care Dental Services are offered Monday-Friday  mornings only. °The numbers of emergencies accepted daily is limited to the number of °providers available. °Maximum 15 - Mondays, Wednesdays & Thursdays °Maximum 10 - Tuesdays & Fridays °Services: °You do not need to be a [place] resident to be seen for a dental emergency. °Emergencies are defined as pain, swelling, abnormal bleeding, or dental trauma. Walkins will receive x-rays if needed. °NOTE: Dental cleaning is not an emergency. °Payment Options: °PAYMENT IS DUE AT THE TIME OF SERVICE. °Minimum co-pay is $40.00 for uninsured patients. °Minimum co-pay is $3.00 for Medicaid with dental coverage. °Dental Insurance is accepted and must be presented at time of visit. °Medicare does not cover dental. °Forms of payment: Cash, credit card, checks. °Best way to get seen: °If not previously registered with the clinic, walk-in dental registration begins at 7:15 am and is on a first come/first serve basis. °If previously registered with the clinic, call to make an appointment. °  °  °[facility] °[phone] °[place] RESIDENTS ONLY °  °Location: °[address], [place], [place] °Clinic Hours: °Mon-Thu 10a-2p °Services: Extractions only! °Payment Options: °FREE (donations accepted) - bring proof of income or support °Best way to get seen: °Call and schedule an appointment OR come at 8am on the 1st Monday of every month (except for holidays) when it is first come/first served. °  °  °[facility] °[phone] °  °Location: °[address], [place] °Clinic Hours: °Friday mornings °Services, Payment Options, Best way to get seen: °Call for info °

## 2017-01-15 NOTE — ED Provider Notes (Signed)
[facility] Emergency Department Provider Note  ____________________________________________  Time seen: Approximately 4:31 PM  I have reviewed the triage vital signs and the nursing notes.   HISTORY  Chief Complaint Dental Pain    HPI [name] is a 66 y.o. male that presents to the emergency department with right lower tooth pain for 3 days.Patient states that almost all of his teeth are decayed and every tooth on the bottom row currently hurts. This morning his right cheek started swelling. Occasionally gets a funny taste in his mouth. He called his primary care provider yesterday, who wrote him a prescription for amoxicillin. He has taken 3 doses of amoxicillin and pain and swelling are getting worse. He has not been able to see a dentist due to lack of dental insurance. No fever, shortness breath, chest pain, nausea, vomiting, abdominal pain.   Past Medical History:  Diagnosis Date  . AAA (abdominal aortic aneurysm) ([place])    a. Duplex 01/2012: stable infrarenal saccular AAA at 3.3cm x 3.2xm (f/u recommended 01/2013 per Dr. Rockey Situ)  . CAD (coronary artery disease)    a. 1995 s/p CABG x 4: LIMA->LAD, VG->RI (known to be occluded), VG->AM->PDA;  b. 05/2002 Inf STEMI: VG->AM->PDA 100% treated w/ 2 BMS complicated by acute thrombosis req 3 BMS;  c. 07/2003 DES to  LAD & LCX (VG's to PDA & RI 100%);  d. 12/2010 Acute MI ([place]): DES to LCX & LM , LIMA ok,;  e.12/2011 Cath: LM/LCX stents ok , LIMA patent.  . Diverticulitis    1/06 Diverticulitis--CT of pelvis--diffuse sigmoid divertic  . GERD (gastroesophageal reflux disease)   . Hyperlipidemia   . Hypertension   . Renal artery stenosis ([place])    a. 03/2011 PTA and stenting of L RA. b. 01/2012 renal duplex - stable 1-59% R renal artery stenosis, normal L renal artery s/p stent.    Patient Active Problem List   Diagnosis Date Noted  . Peripheral vascular disease ([place]) 01/07/2017  . Orthostasis 08/19/2016  .  Chronic dental pain 02/09/2016  . Advance directive discussed with patient 01/05/2016  . CAD (coronary artery disease) of artery bypass graft 04/04/2014  . Routine general medical examination at a health care facility 12/19/2013  . AAA (abdominal aortic aneurysm) without rupture ([place]) 09/20/2013  . History of MI (myocardial infarction) 09/20/2013  . Renal artery stenosis ([place]) 04/01/2011  . Sleep disorder 12/30/2010  . Hyperlipidemia LDL goal <70 12/21/2007  . Essential hypertension 12/21/2007  . Atherosclerotic heart disease of native coronary artery with angina pectoris ([place]) 12/21/2007  . GERD 12/21/2007  . DIVERTICULAR DISEASE 05/18/2004    Past Surgical History:  Procedure Laterality Date  . ANGIOPLASTY  1997  . CARDIAC CATHETERIZATION     8/09  Cath--vein graft occlusions which are old--no acute changes  . CARDIAC CATHETERIZATION  11/13/2010   stent x 2 @ [place]  . CARDIAC CATHETERIZATION  04/05/2014   stent placement   . CLOSED REDUCTION SHOULDER DISLOCATION    . CORONARY ANGIOPLASTY  04/05/2014   stent placement OM 1  . CORONARY ARTERY BYPASS GRAFT    . CORONARY STENT PLACEMENT  7/12   2 stents--Promus element plus (everolimus eluting)--[facility] in [place]  . LEFT HEART CATHETERIZATION WITH CORONARY/GRAFT ANGIOGRAM N/A 01/04/2012   Procedure: LEFT HEART CATHETERIZATION WITH Beatrix Fetters;  Surgeon: Burnell Blanks, MD;  Location: [facility] CATH LAB;  Service: Cardiovascular;  Laterality: N/A;  . LEFT HEART CATHETERIZATION WITH CORONARY/GRAFT ANGIOGRAM N/A 04/05/2014   Procedure:  LEFT HEART CATHETERIZATION WITH Beatrix Fetters;  Surgeon: Jettie Booze, MD;  Location: [facility] CATH LAB;  Service: Cardiovascular;  Laterality: N/A;  . PERCUTANEOUS CORONARY STENT INTERVENTION (PCI-S)  04/05/2014   Procedure: PERCUTANEOUS CORONARY STENT INTERVENTION (PCI-S);  Surgeon: Jettie Booze, MD;  Location: [facility] CATH LAB;  Service: Cardiovascular;;  OM1   . RENAL ANGIOGRAM N/A 04/16/2011   Procedure: RENAL ANGIOGRAM;  Surgeon: Burnell Blanks, MD;  Location: [facility] CATH LAB;  Service: Cardiovascular;  Laterality: N/A;  . RENAL ARTERY STENT  03/2011 ?    Prior to Admission medications   Medication Sig Start Date End Date Taking? Authorizing Provider  amoxicillin (AMOXIL) 500 MG capsule Take 1 capsule (500 mg total) by mouth 3 (three) times daily. 01/14/17   Ria Bush, MD  aspirin 81 MG tablet Take 81 mg by mouth daily.    [provider]  carvedilol (COREG) 3.125 MG tablet TAKE 1 TABLET BY MOUTH TWICE A DAY 06/21/16   Venia Carbon, MD  clindamycin (CLEOCIN) 300 MG capsule Take 1 capsule (300 mg total) by mouth 4 (four) times daily. 01/15/17 01/25/17  Laban Emperor, PA-C  clopidogrel (PLAVIX) 75 MG tablet Take 1 tablet (75 mg total) by mouth daily. 02/18/16   Minna Merritts, MD  ezetimibe (ZETIA) 10 MG tablet TAKE 1 TABLET BY MOUTH DAILY 12/13/16   Minna Merritts, MD  isosorbide mononitrate (IMDUR) 30 MG 24 hr tablet Take 0.5 tablets (15 mg total) by mouth at bedtime. 12/13/16   Minna Merritts, MD  lidocaine (XYLOCAINE) 2 % solution Use as directed 10 mLs in the mouth or throat as needed for mouth pain. 01/15/17   Laban Emperor, PA-C  lisinopril (PRINIVIL,ZESTRIL) 5 MG tablet TAKE 1 TABLET BY MOUTH DAILY 09/13/16   Minna Merritts, MD  mirtazapine (REMERON) 15 MG tablet TAKE 1 TABLET BY MOUTH EVERY NIGHT AT BEDTIME FOR SLEEP. 03/26/16   Venia Carbon, MD  naproxen sodium (ALEVE) 220 MG tablet Take 220 mg by mouth daily as needed (pain).    [provider]  nitroGLYCERIN (NITROSTAT) 0.4 MG SL tablet Place 1 tablet (0.4 mg total) under the tongue every 5 (five) minutes as needed for chest pain. 05/06/14   Minna Merritts, MD  omeprazole (PRILOSEC OTC) 20 MG tablet Take 20 mg by mouth daily.    [provider]  rosuvastatin (CRESTOR) 40 MG tablet TAKE 1 TABLET BY MOUTH DAILY 10/18/16   Minna Merritts, MD    Allergies Metoclopramide and Metoprolol tartrate  Family History  Problem Relation Age of Onset  . Coronary artery disease Mother        Died MI age 55  . Hypertension Mother   . Heart attack Mother   . Coronary artery disease Sister        Living  . Coronary artery disease Brother        Living  . Coronary artery disease Father        Died MI age 47  . Heart attack Father   . Heart attack Maternal Grandfather   . Diabetes Neg Hx   . Cancer Neg Hx        prostate or colon    Social History Social History  Substance Use Topics  . Smoking status: Former Smoker    Packs/day: 3.00    Years: 25.00    Types: Cigarettes    Quit date: 05/28/1993  . Smokeless tobacco: Never Used  . Alcohol use 8.4  oz/week    14 Cans of beer per week     Review of Systems  Constitutional: No fever/chills Cardiovascular: No chest pain. Respiratory: No cough. No SOB. Gastrointestinal: No abdominal pain.  No nausea, no vomiting.  Musculoskeletal: Negative for musculoskeletal pain. Skin: Negative for rash, abrasions, lacerations, ecchymosis. Neurological: Negative for headaches, numbness or tingling   ____________________________________________   PHYSICAL EXAM:  VITAL SIGNS: ED Triage Vitals  Enc Vitals Group     BP 01/15/17 1415 (!) 168/86     Pulse Rate 01/15/17 1415 77     Resp 01/15/17 1415 18     Temp 01/15/17 1415 98.4 F (36.9 C)     Temp Source 01/15/17 1415 Oral     SpO2 01/15/17 1415 98 %     Weight 01/15/17 1416 163 lb (73.9 kg)     Height 01/15/17 1416 5\' 4"  (1.626 m)     Head Circumference --      Peak Flow --      Pain Score 01/15/17 1415 7     Pain Loc --      Pain Edu? --      Excl. in [place]? --      Constitutional: Alert and oriented. Well appearing and in no acute distress. Eyes: Conjunctivae are normal. PERRL. EOMI. Head: Atraumatic. ENT:      Ears:      Nose: No congestion/rhinnorhea.      Mouth/Throat: Mucous membranes are moist.  Very poor dentition. Most teeth decayed to the gumline. Tenderness to palpation over lower right molars. No discharge noted. No TMJ pain. Moderate swelling over right cheek. No swelling under chin. Neck: No stridor.   Cardiovascular: Normal rate, regular rhythm.  Good peripheral circulation. Respiratory: Normal respiratory effort without tachypnea or retractions. Lungs CTAB. Good air entry to the bases with no decreased or absent breath sounds. Musculoskeletal: Full range of motion to all extremities. No gross deformities appreciated. Neurologic:  Normal speech and language. No gross focal neurologic deficits are appreciated.  Skin:  Skin is warm, dry and intact. No rash noted.   ____________________________________________   LABS (all labs ordered are listed, but only abnormal results are displayed)  Labs Reviewed - No data to display ____________________________________________  EKG   ____________________________________________  RADIOLOGY   No results found.  ____________________________________________    PROCEDURES  Procedure(s) performed:    Procedures    Medications  dexamethasone (DECADRON) injection 10 mg (10 mg Intravenous Given 01/15/17 1630)  clindamycin (CLEOCIN) IVPB 600 mg (0 mg Intravenous Stopped 01/15/17 1753)  diphenhydrAMINE (BENADRYL) injection 25 mg (25 mg Intravenous Given 01/15/17 1632)  lidocaine (XYLOCAINE) 2 % viscous mouth solution 15 mL (15 mLs Mouth/Throat Given 01/15/17 1625)  oxyCODONE-acetaminophen (PERCOCET/ROXICET) 5-325 MG per tablet 1 tablet (1 tablet Oral Given 01/15/17 1622)     ____________________________________________   INITIAL IMPRESSION / ASSESSMENT AND PLAN / ED COURSE  Pertinent labs & imaging results that were available during my care of the patient were reviewed by me and considered in my medical decision making (see chart for details).  Review of the [place] CSRS was performed in accordance of the [place] prior to  dispensing any controlled drugs.   Patient's diagnosis is consistent with dental abscess. Vital signs and exam are reassuring. IV clindamycin, Decadron, Benadryl was given. Patient will be discharged home with prescriptions for clindamycin. He will continue taking amoxicillin. Patient is to follow up with PCP as directed. Patient is given ED precautions to return to the ED for  any worsening or new symptoms.     ____________________________________________  FINAL CLINICAL IMPRESSION(S) / ED DIAGNOSES  Final diagnoses:  Dental abscess      NEW MEDICATIONS STARTED DURING THIS VISIT:  Discharge Medication List as of 01/15/2017  5:17 PM    START taking these medications   Details  clindamycin (CLEOCIN) 300 MG capsule Take 1 capsule (300 mg total) by mouth 4 (four) times daily., Starting Sat 01/15/2017, Until Tue 01/25/2017, Print    lidocaine (XYLOCAINE) 2 % solution Use as directed 10 mLs in the mouth or throat as needed for mouth pain., Starting Sat 01/15/2017, Print            This chart was dictated using voice recognition software/Dragon. Despite best efforts to proofread, errors can occur which can change the meaning. Any change was purely unintentional.    Laban Emperor, PA-C 01/15/17 1856    Lisa Roca, MD 01/16/17 [phone]

## 2017-01-17 ENCOUNTER — Other Ambulatory Visit: Payer: Self-pay | Admitting: Cardiovascular Disease

## 2017-01-19 ENCOUNTER — Telehealth: Payer: Self-pay

## 2017-01-19 NOTE — Telephone Encounter (Signed)
Spoke to pt and his wife. He just went to the Oral Surgeon today and he is having multiple teeth pulled.

## 2017-01-19 NOTE — Telephone Encounter (Signed)
Per Dr Silvio Pate, calling pt to see how he is doing after his Er visit 01-15-17 for tooth abscess. Left message to call office

## 2017-01-20 ENCOUNTER — Telehealth: Payer: Self-pay | Admitting: Cardiovascular Disease

## 2017-01-20 NOTE — Telephone Encounter (Signed)
Okay to stop Plavix 5 days before procedure Would restart after procedure is complete

## 2017-01-20 NOTE — Telephone Encounter (Addendum)
Received Pre-Operative Medical Clearance from [facility] for patient to have surgery on 10/2 with Dr. Hampton Abbot. Patient is having multiple teeth extractions under local anesthesia, and they want to know recommendations for holding plavix. Patients last office visit was 08/19/16 and spoke with him today and he denies any changes since his last visit except for the tooth ache. Will route to Dr. Rockey Situ for review. Route clearance to [phone]

## 2017-01-20 NOTE — Telephone Encounter (Signed)
Clearance routed to number provided.  

## 2017-03-07 ENCOUNTER — Other Ambulatory Visit: Payer: Self-pay | Admitting: Cardiovascular Disease

## 2017-03-21 ENCOUNTER — Other Ambulatory Visit: Payer: Self-pay | Admitting: Cardiovascular Disease

## 2017-03-21 NOTE — Telephone Encounter (Signed)
Pt needs f/u appt with [facility]. Thanks 

## 2017-03-27 ENCOUNTER — Other Ambulatory Visit: Payer: Self-pay | Admitting: Cardiovascular Disease

## 2017-03-28 ENCOUNTER — Other Ambulatory Visit: Payer: Medicare Other

## 2017-03-30 ENCOUNTER — Ambulatory Visit (INDEPENDENT_AMBULATORY_CARE_PROVIDER_SITE_OTHER): Payer: Medicare Other

## 2017-03-30 DIAGNOSIS — I714 Abdominal aortic aneurysm, without rupture, unspecified: Secondary | ICD-10-CM

## 2017-03-30 NOTE — Progress Notes (Signed)
Cardiology Office Note  Date:  03/31/2017   ID:  [name], DOB 1951/03/23, MRN [id number]  PCP:  Venia Carbon, MD   Chief Complaint  Patient presents with  . Other    6 month follow up. Patient denies chest pain and SOB. Meds reviewed verbally with patient.     HPI:   [name] is a 66 year old gentleman with  coronary artery disease, bypass surgery in 1995,  stenting at [facility] in February 2004 for MI with a 3.0 x 25 mm and 4.5 x 16 mm Monorail ( location uncertain), also 4.5 x 18 mm stent placed to the mid RCA, followup with repeat stenting in April 2005 to the proximal LAD with a Taxus stent 2.5 x 8 mm, and stenting to the proximal left circumflex with a 2.5 x 12 mm Taxus, with  stent placed November 13, 2010 with a 4.0 x 9 mm stent placed to the left main. renal artery stent. Abdominal aortic aneurysm 3.2 cm Prior smoking history  He presents for routine followup of his coronary artery disease.  On his last clinic visit he was orthostatic, with dizziness symptoms on standing Imdur was cut in half and symptoms have resolved  Since then he has had all his teeth pulled out, still with swelling of his gums, difficulty eating Otherwise has no chest pain, shortness of breath on exertion  Lab work reviewed from September 2018 Total cholesterol 141  EKG personally reviewed by myself on todays visit Shows normal sinus rhythm with rate 64 bpm T-wave abnormality, no change from previous EKGs  Other past medical history reviewed   chest pain 04/05/2014, went to [facility] head catheterization. This showed an occluded OM vessel with DES placed 2 Occluded mid LAD with a patent LIMA to the LAD, patent left circumflex, prior bare-metal stent into the OM1 was occluded in the distal portion just after the ostium of OM1. Occluded RCA, ejection fraction 35%  Previous episode of chest pain while in [place] with hospitalization at that time, negative stress test,  2013 recurrent chest pain requiring admission to [facility], repeat catheterization performed 12/2011 This showed patent stents to the left circumflex and LAD with patent LIMA to the LAD. Vein graft to the OM and vein graft to the RCA was occluded  admission to [facility] at the beginning of July 2014. Notes from the hospital were reviewed. Ruled out for MI with negative cardiac enzymes, CT scan chest showing no PE. He had stress test 10/25/2012 showing old inferior wall MI with defect noted in the basal to mid inferior wall. Discharged on tramadol.   Ultrasound of his aorta in [place] 12/22/2011 showed distal aorta 3 cm, iliacs of normal size Previous lower extremity ultrasound  showed no significant disease  He used to be a smoker though stopped in 1995. Prior to that he smoked 3 packs per day. Smoked for approximately 25-28 years    PMH:   has a past medical history of AAA (abdominal aortic aneurysm) ([place]), CAD (coronary artery disease), Diverticulitis, GERD (gastroesophageal reflux disease), Hyperlipidemia, Hypertension, and Renal artery stenosis ([place]).  PSH:    Past Surgical History:  Procedure Laterality Date  . ANGIOPLASTY  1997  . CARDIAC CATHETERIZATION     8/09  Cath--vein graft occlusions which are old--no acute changes  . CARDIAC CATHETERIZATION  11/13/2010   stent x 2 @ [place]  . CARDIAC CATHETERIZATION  04/05/2014   stent placement   . CLOSED REDUCTION SHOULDER DISLOCATION    .  CORONARY ANGIOPLASTY  04/05/2014   stent placement OM 1  . CORONARY ARTERY BYPASS GRAFT    . CORONARY STENT PLACEMENT  7/12   2 stents--Promus element plus (everolimus eluting)--[facility] in [place]  . LEFT HEART CATHETERIZATION WITH CORONARY/GRAFT ANGIOGRAM N/A 01/04/2012   Procedure: LEFT HEART CATHETERIZATION WITH Beatrix Fetters;  Surgeon: Burnell Blanks, MD;  Location: [facility] CATH LAB;  Service: Cardiovascular;  Laterality: N/A;  . LEFT HEART CATHETERIZATION WITH  CORONARY/GRAFT ANGIOGRAM N/A 04/05/2014   Procedure: LEFT HEART CATHETERIZATION WITH Beatrix Fetters;  Surgeon: Jettie Booze, MD;  Location: [facility] CATH LAB;  Service: Cardiovascular;  Laterality: N/A;  . PERCUTANEOUS CORONARY STENT INTERVENTION (PCI-S)  04/05/2014   Procedure: PERCUTANEOUS CORONARY STENT INTERVENTION (PCI-S);  Surgeon: Jettie Booze, MD;  Location: [facility] CATH LAB;  Service: Cardiovascular;;  OM1  . RENAL ANGIOGRAM N/A 04/16/2011   Procedure: RENAL ANGIOGRAM;  Surgeon: Burnell Blanks, MD;  Location: [facility] CATH LAB;  Service: Cardiovascular;  Laterality: N/A;  . RENAL ARTERY STENT  03/2011 ?    Current Outpatient Medications  Medication Sig Dispense Refill  . aspirin 81 MG tablet Take 81 mg by mouth daily.    . carvedilol (COREG) 3.125 MG tablet TAKE 1 TABLET BY MOUTH TWICE A DAY 180 tablet 3  . clopidogrel (PLAVIX) 75 MG tablet TAKE 1 TABLET BY MOUTH DAILY 30 tablet 0  . ezetimibe (ZETIA) 10 MG tablet TAKE 1 TABLET BY MOUTH DAILY 30 tablet 3  . isosorbide mononitrate (IMDUR) 30 MG 24 hr tablet Take 0.5 tablets (15 mg total) by mouth at bedtime. 15 tablet 3  . lisinopril (PRINIVIL,ZESTRIL) 5 MG tablet TAKE 1 TABLET BY MOUTH DAILY 30 tablet 1  . mirtazapine (REMERON) 15 MG tablet TAKE 1 TABLET BY MOUTH EVERY NIGHT AT BEDTIME FOR SLEEP. 90 tablet 3  . naproxen sodium (ALEVE) 220 MG tablet Take 220 mg by mouth daily as needed (pain).    . nitroGLYCERIN (NITROSTAT) 0.4 MG SL tablet Place 1 tablet (0.4 mg total) under the tongue every 5 (five) minutes as needed for chest pain. 25 tablet 6  . omeprazole (PRILOSEC OTC) 20 MG tablet Take 20 mg by mouth daily.    . rosuvastatin (CRESTOR) 40 MG tablet TAKE 1 TABLET BY MOUTH DAILY 30 tablet 3   No current facility-administered medications for this visit.      Allergies:   Metoclopramide and Metoprolol tartrate   Social History:  The patient  reports that he quit smoking about 23 years ago. His smoking use  included cigarettes. He has a 75.00 pack-year smoking history. he has never used smokeless tobacco. He reports that he drinks about 8.4 oz of alcohol per week. He reports that he does not use drugs.   Family History:   family history includes Coronary artery disease in his brother, father, mother, and sister; Heart attack in his father, maternal grandfather, and mother; Hypertension in his mother.    Review of Systems: Review of Systems  Constitutional: Negative.   HENT:       Dental problems  Respiratory: Negative.   Cardiovascular: Negative.   Gastrointestinal: Negative.   Musculoskeletal: Negative.   Neurological: Negative.        Lightheaded  Psychiatric/Behavioral: Negative.   All other systems reviewed and are negative.    PHYSICAL EXAM: VS:  BP 130/86 (BP Location: Left Arm, Patient Position: Sitting, Cuff Size: Normal)   Pulse 64   Ht 5\' 5"  (1.651 m)   Wt 163 lb 8  oz (74.2 kg)   BMI 27.21 kg/m  , BMI Body mass index is 27.21 kg/m. GEN: Well nourished, well developed, in no acute distress  HEENT: normal  Neck: no JVD, carotid bruits, or masses Cardiac: RRR; no murmurs, rubs, or gallops,no edema  Respiratory:  clear to auscultation bilaterally, normal work of breathing GI: soft, nontender, nondistended, + BS MS: no deformity or atrophy  Skin: warm and dry, no rash Neuro:  Strength and sensation are intact Psych: euthymic mood, full affect    Recent Labs: 01/07/2017: ALT 24; BUN 4; Creat 0.84; Hemoglobin 13.6; Platelets 276; Potassium 4.6; Sodium 134    Lipid Panel Lab Results  Component Value Date   CHOL 141 01/07/2017   HDL 34 (L) 01/07/2017   LDLCALC 72 08/11/2015   TRIG 362 (H) 01/07/2017      Wt Readings from Last 3 Encounters:  03/31/17 163 lb 8 oz (74.2 kg)  01/15/17 163 lb (73.9 kg)  01/07/17 163 lb 8 oz (74.2 kg)       ASSESSMENT AND PLAN:  Dizziness/orthostasis Resolved by decreasing dose of isosorbide No further workup needed  SOB  (shortness of breath) on exertion - Plan: EKG 12-Lead Stable.  No recent COPD exacerbation Prior smoking history  Hyperlipidemia LDL goal <70 Cholesterol is at goal on the current lipid regimen. No changes to the medications were made. Stable  Essential hypertension Blood pressure is well controlled on today's visit. No changes made to the medications. Stable  Atherosclerosis of native coronary artery of native heart with angina pectoris (HCC) No further testing, denies having anginal symptoms We will continue aggressive cholesterol management  AAA (abdominal aortic aneurysm) without rupture (HCC) 3.7 cm December 2017  Renal artery stenosis (HCC) 60% renal artery stenosis consider repeat ultrasound on next clinic visit  Coronary artery disease involving coronary bypass graft of native heart with unstable angina pectoris ([place]) Currently with no symptoms of angina. No further workup at this time. Continue current medication regimen.  Dental pain Chronic issue Had all his teeth pulled   Total encounter time more than 25 minutes  Greater than 50% was spent in counseling and coordination of care with the patient   Disposition:   F/U  12 months   Orders Placed This Encounter  Procedures  . EKG 12-Lead     Signed, Esmond Plants, M.D., Ph.D. 03/31/2017  [name], [place]

## 2017-03-31 ENCOUNTER — Ambulatory Visit (INDEPENDENT_AMBULATORY_CARE_PROVIDER_SITE_OTHER): Payer: Medicare Other | Admitting: Cardiovascular Disease

## 2017-03-31 VITALS — BP 130/86 | HR 64 | Ht 65.0 in | Wt 163.5 lb

## 2017-03-31 DIAGNOSIS — I739 Peripheral vascular disease, unspecified: Secondary | ICD-10-CM | POA: Diagnosis not present

## 2017-03-31 DIAGNOSIS — I714 Abdominal aortic aneurysm, without rupture, unspecified: Secondary | ICD-10-CM

## 2017-03-31 DIAGNOSIS — I10 Essential (primary) hypertension: Secondary | ICD-10-CM | POA: Diagnosis not present

## 2017-03-31 DIAGNOSIS — I25119 Atherosclerotic heart disease of native coronary artery with unspecified angina pectoris: Secondary | ICD-10-CM | POA: Diagnosis not present

## 2017-03-31 DIAGNOSIS — I200 Unstable angina: Secondary | ICD-10-CM | POA: Diagnosis not present

## 2017-03-31 DIAGNOSIS — E785 Hyperlipidemia, unspecified: Secondary | ICD-10-CM

## 2017-03-31 DIAGNOSIS — I951 Orthostatic hypotension: Secondary | ICD-10-CM | POA: Diagnosis not present

## 2017-03-31 DIAGNOSIS — I25700 Atherosclerosis of coronary artery bypass graft(s), unspecified, with unstable angina pectoris: Secondary | ICD-10-CM

## 2017-03-31 DIAGNOSIS — I701 Atherosclerosis of renal artery: Secondary | ICD-10-CM | POA: Diagnosis not present

## 2017-03-31 NOTE — Patient Instructions (Signed)

## 2017-04-18 ENCOUNTER — Other Ambulatory Visit: Payer: Self-pay | Admitting: Internal Medicine

## 2017-04-24 ENCOUNTER — Other Ambulatory Visit: Payer: Self-pay | Admitting: Cardiovascular Disease

## 2017-04-27 ENCOUNTER — Other Ambulatory Visit: Payer: Self-pay

## 2017-04-27 MED ORDER — CARVEDILOL 3.125 MG PO TABS: 3.1250 mg | ORAL_TABLET | Freq: Two times a day (BID) | ORAL | 3 refills | Status: DC

## 2017-04-27 MED ORDER — EZETIMIBE 10 MG PO TABS: 10.0000 mg | ORAL_TABLET | Freq: Every day | ORAL | 3 refills | Status: DC

## 2017-04-27 MED ORDER — LISINOPRIL 5 MG PO TABS: 5.0000 mg | ORAL_TABLET | Freq: Every day | ORAL | 5 refills | Status: DC

## 2017-04-27 MED ORDER — ISOSORBIDE MONONITRATE ER 30 MG PO TB24: 15.0000 mg | ORAL_TABLET | Freq: Every day | ORAL | 3 refills | Status: DC

## 2017-04-27 MED ORDER — ROSUVASTATIN CALCIUM 40 MG PO TABS: 40.0000 mg | ORAL_TABLET | Freq: Every day | ORAL | 3 refills | Status: DC

## 2017-04-27 MED ORDER — CLOPIDOGREL BISULFATE 75 MG PO TABS: 75.0000 mg | ORAL_TABLET | Freq: Every day | ORAL | 5 refills | Status: DC

## 2017-05-13 ENCOUNTER — Other Ambulatory Visit: Payer: Self-pay | Admitting: *Deleted

## 2017-05-13 DIAGNOSIS — I714 Abdominal aortic aneurysm, without rupture, unspecified: Secondary | ICD-10-CM

## 2017-05-23 ENCOUNTER — Other Ambulatory Visit: Payer: Self-pay

## 2017-05-23 MED ORDER — LISINOPRIL 5 MG PO TABS: 5.0000 mg | ORAL_TABLET | Freq: Every day | ORAL | 5 refills | Status: DC

## 2017-05-23 MED ORDER — CLOPIDOGREL BISULFATE 75 MG PO TABS: 75.0000 mg | ORAL_TABLET | Freq: Every day | ORAL | 5 refills | Status: DC

## 2017-06-01 ENCOUNTER — Other Ambulatory Visit: Payer: Self-pay

## 2017-06-01 MED ORDER — NITROGLYCERIN 0.4 MG SL SUBL: 0.4000 mg | SUBLINGUAL_TABLET | SUBLINGUAL | 2 refills | Status: DC | PRN

## 2017-07-03 ENCOUNTER — Other Ambulatory Visit: Payer: Self-pay | Admitting: Cardiovascular Disease

## 2017-08-27 ENCOUNTER — Other Ambulatory Visit: Payer: Self-pay | Admitting: Cardiovascular Disease

## 2017-09-26 ENCOUNTER — Other Ambulatory Visit: Payer: Self-pay | Admitting: *Deleted

## 2017-09-26 MED ORDER — CLOPIDOGREL BISULFATE 75 MG PO TABS: 75.0000 mg | ORAL_TABLET | Freq: Every day | ORAL | 3 refills | Status: DC

## 2017-09-29 ENCOUNTER — Other Ambulatory Visit: Payer: Self-pay

## 2017-09-29 MED ORDER — CLOPIDOGREL BISULFATE 75 MG PO TABS: 75.0000 mg | ORAL_TABLET | Freq: Every day | ORAL | 3 refills | Status: DC

## 2017-10-12 ENCOUNTER — Other Ambulatory Visit: Payer: Self-pay | Admitting: Cardiovascular Disease

## 2017-10-26 ENCOUNTER — Other Ambulatory Visit: Payer: Self-pay | Admitting: Cardiovascular Disease

## 2017-10-28 ENCOUNTER — Other Ambulatory Visit: Payer: Self-pay

## 2017-10-28 ENCOUNTER — Observation Stay (HOSPITAL_COMMUNITY)
Admission: EM | Admit: 2017-10-28 | Discharge: 2017-10-30 | Disposition: A | Discharge status: Home / Self Care | Payer: Medicare Other | Attending: Interventional Cardiology | Admitting: Interventional Cardiology

## 2017-10-28 ENCOUNTER — Encounter (HOSPITAL_COMMUNITY): Payer: Self-pay | Admitting: Internal Medicine

## 2017-10-28 ENCOUNTER — Emergency Department (HOSPITAL_COMMUNITY): Payer: Medicare Other

## 2017-10-28 ENCOUNTER — Observation Stay (HOSPITAL_COMMUNITY): Payer: Medicare Other

## 2017-10-28 ENCOUNTER — Ambulatory Visit (HOSPITAL_COMMUNITY)
Admission: EM | Disposition: A | Discharge status: Home / Self Care | Payer: Self-pay | Source: Home / Self Care | Attending: Emergency Medicine

## 2017-10-28 DIAGNOSIS — I25110 Atherosclerotic heart disease of native coronary artery with unstable angina pectoris: Secondary | ICD-10-CM | POA: Diagnosis not present

## 2017-10-28 DIAGNOSIS — K219 Gastro-esophageal reflux disease without esophagitis: Secondary | ICD-10-CM | POA: Diagnosis not present

## 2017-10-28 DIAGNOSIS — E663 Overweight: Secondary | ICD-10-CM | POA: Insufficient documentation

## 2017-10-28 DIAGNOSIS — I252 Old myocardial infarction: Secondary | ICD-10-CM | POA: Diagnosis not present

## 2017-10-28 DIAGNOSIS — I429 Cardiomyopathy, unspecified: Secondary | ICD-10-CM | POA: Insufficient documentation

## 2017-10-28 DIAGNOSIS — I10 Essential (primary) hypertension: Secondary | ICD-10-CM | POA: Insufficient documentation

## 2017-10-28 DIAGNOSIS — I714 Abdominal aortic aneurysm, without rupture: Secondary | ICD-10-CM | POA: Insufficient documentation

## 2017-10-28 DIAGNOSIS — I25700 Atherosclerosis of coronary artery bypass graft(s), unspecified, with unstable angina pectoris: Secondary | ICD-10-CM | POA: Insufficient documentation

## 2017-10-28 DIAGNOSIS — R0602 Shortness of breath: Secondary | ICD-10-CM | POA: Diagnosis not present

## 2017-10-28 DIAGNOSIS — I700 Atherosclerosis of aorta: Secondary | ICD-10-CM | POA: Diagnosis not present

## 2017-10-28 DIAGNOSIS — K5730 Diverticulosis of large intestine without perforation or abscess without bleeding: Secondary | ICD-10-CM | POA: Diagnosis not present

## 2017-10-28 DIAGNOSIS — F1010 Alcohol abuse, uncomplicated: Secondary | ICD-10-CM | POA: Insufficient documentation

## 2017-10-28 DIAGNOSIS — I495 Sick sinus syndrome: Secondary | ICD-10-CM

## 2017-10-28 DIAGNOSIS — E871 Hypo-osmolality and hyponatremia: Secondary | ICD-10-CM | POA: Diagnosis not present

## 2017-10-28 DIAGNOSIS — I25119 Atherosclerotic heart disease of native coronary artery with unspecified angina pectoris: Secondary | ICD-10-CM

## 2017-10-28 DIAGNOSIS — J449 Chronic obstructive pulmonary disease, unspecified: Secondary | ICD-10-CM | POA: Diagnosis not present

## 2017-10-28 DIAGNOSIS — I739 Peripheral vascular disease, unspecified: Secondary | ICD-10-CM | POA: Insufficient documentation

## 2017-10-28 DIAGNOSIS — Z955 Presence of coronary angioplasty implant and graft: Secondary | ICD-10-CM | POA: Diagnosis not present

## 2017-10-28 DIAGNOSIS — E785 Hyperlipidemia, unspecified: Secondary | ICD-10-CM | POA: Insufficient documentation

## 2017-10-28 DIAGNOSIS — R42 Dizziness and giddiness: Secondary | ICD-10-CM | POA: Diagnosis not present

## 2017-10-28 DIAGNOSIS — G8929 Other chronic pain: Secondary | ICD-10-CM | POA: Insufficient documentation

## 2017-10-28 DIAGNOSIS — Z6826 Body mass index (BMI) 26.0-26.9, adult: Secondary | ICD-10-CM | POA: Insufficient documentation

## 2017-10-28 DIAGNOSIS — E781 Pure hyperglyceridemia: Secondary | ICD-10-CM | POA: Diagnosis not present

## 2017-10-28 DIAGNOSIS — K8020 Calculus of gallbladder without cholecystitis without obstruction: Secondary | ICD-10-CM | POA: Insufficient documentation

## 2017-10-28 DIAGNOSIS — K0889 Other specified disorders of teeth and supporting structures: Secondary | ICD-10-CM | POA: Diagnosis not present

## 2017-10-28 DIAGNOSIS — M549 Dorsalgia, unspecified: Secondary | ICD-10-CM | POA: Diagnosis not present

## 2017-10-28 DIAGNOSIS — K429 Umbilical hernia without obstruction or gangrene: Secondary | ICD-10-CM | POA: Diagnosis not present

## 2017-10-28 DIAGNOSIS — I701 Atherosclerosis of renal artery: Secondary | ICD-10-CM | POA: Diagnosis not present

## 2017-10-28 DIAGNOSIS — Z951 Presence of aortocoronary bypass graft: Secondary | ICD-10-CM | POA: Diagnosis not present

## 2017-10-28 DIAGNOSIS — R079 Chest pain, unspecified: Secondary | ICD-10-CM | POA: Diagnosis not present

## 2017-10-28 DIAGNOSIS — R0789 Other chest pain: Secondary | ICD-10-CM | POA: Diagnosis not present

## 2017-10-28 DIAGNOSIS — D649 Anemia, unspecified: Secondary | ICD-10-CM | POA: Insufficient documentation

## 2017-10-28 DIAGNOSIS — I200 Unstable angina: Secondary | ICD-10-CM | POA: Diagnosis present

## 2017-10-28 HISTORY — DX: Dyspnea, unspecified: R06.00

## 2017-10-28 HISTORY — DX: Alcohol abuse, uncomplicated: F10.10

## 2017-10-28 HISTORY — PX: LEFT HEART CATH AND CORS/GRAFTS ANGIOGRAPHY: CATH118250

## 2017-10-28 HISTORY — DX: Peripheral vascular disease, unspecified: I73.9

## 2017-10-28 HISTORY — PX: CARDIAC CATHETERIZATION: SHX172

## 2017-10-28 HISTORY — DX: Cardiomyopathy, unspecified: I42.9

## 2017-10-28 LAB — BRAIN NATRIURETIC PEPTIDE: B Natriuretic Peptide: 107.8 pg/mL — ABNORMAL HIGH (ref 0.0–100.0)

## 2017-10-28 LAB — PROTIME-INR
INR: 1.22
Prothrombin Time: 15.3 seconds — ABNORMAL HIGH (ref 11.4–15.2)

## 2017-10-28 LAB — CBC
HCT: 38.8 % — ABNORMAL LOW (ref 39.0–52.0)
Hemoglobin: 12.8 g/dL — ABNORMAL LOW (ref 13.0–17.0)
MCH: 31.4 pg (ref 26.0–34.0)
MCHC: 33 g/dL (ref 30.0–36.0)
MCV: 95.1 fL (ref 78.0–100.0)
PLATELETS: 238 10*3/uL (ref 150–400)
RBC: 4.08 MIL/uL — ABNORMAL LOW (ref 4.22–5.81)
RDW: 14.5 % (ref 11.5–15.5)
WBC: 6.3 10*3/uL (ref 4.0–10.5)

## 2017-10-28 LAB — HEPATIC FUNCTION PANEL
ALBUMIN: 3 g/dL — AB (ref 3.5–5.0)
ALK PHOS: 39 U/L (ref 38–126)
ALT: 19 U/L (ref 0–44)
AST: 30 U/L (ref 15–41)
BILIRUBIN TOTAL: 0.8 mg/dL (ref 0.3–1.2)
Bilirubin, Direct: 0.2 mg/dL (ref 0.0–0.2)
Indirect Bilirubin: 0.6 mg/dL (ref 0.3–0.9)
Total Protein: 6 g/dL — ABNORMAL LOW (ref 6.5–8.1)

## 2017-10-28 LAB — BASIC METABOLIC PANEL
Anion gap: 6 (ref 5–15)
BUN: 5 mg/dL — AB (ref 8–23)
CO2: 25 mmol/L (ref 22–32)
CREATININE: 0.79 mg/dL (ref 0.61–1.24)
Calcium: 9 mg/dL (ref 8.9–10.3)
Chloride: 103 mmol/L (ref 98–111)
GFR calc Af Amer: >60 mL/min (ref >60)
GLUCOSE: 110 mg/dL — AB (ref 70–99)
Potassium: 4.2 mmol/L (ref 3.5–5.1)
Sodium: 134 mmol/L — ABNORMAL LOW (ref 135–145)

## 2017-10-28 LAB — I-STAT TROPONIN, ED
Troponin i, poc: 0 ng/mL (ref 0.00–0.08)
Troponin i, poc: 0 ng/mL (ref 0.00–0.08)

## 2017-10-28 LAB — TSH: TSH: 2.086 u[IU]/mL (ref 0.350–4.500)

## 2017-10-28 LAB — TROPONIN I
Troponin I: <0.03 ng/mL (ref <0.03)
Troponin I: <0.03 ng/mL (ref <0.03)

## 2017-10-28 SURGERY — LEFT HEART CATH AND CORS/GRAFTS ANGIOGRAPHY
Anesthesia: LOCAL

## 2017-10-28 MED ORDER — LORAZEPAM 1 MG PO TABS: 0.0000 mg | ORAL_TABLET | Freq: Two times a day (BID) | ORAL | Status: DC

## 2017-10-28 MED ORDER — NITROGLYCERIN 0.4 MG SL SUBL
0.4000 mg | SUBLINGUAL_TABLET | SUBLINGUAL | Status: DC | PRN
Start: 2017-10-28 — End: 2017-10-30
  Administered 2017-10-29 (×2): 0.4 mg via SUBLINGUAL
  Filled 2017-10-28: qty 1

## 2017-10-28 MED ORDER — SODIUM CHLORIDE 0.9% FLUSH: 3.0000 mL | Freq: Two times a day (BID) | INTRAVENOUS | Status: DC

## 2017-10-28 MED ORDER — SODIUM CHLORIDE 0.9% FLUSH: 3.0000 mL | INTRAVENOUS | Status: DC | PRN

## 2017-10-28 MED ORDER — MIDAZOLAM HCL 2 MG/2ML IJ SOLN
INTRAMUSCULAR | Status: AC
  Filled 2017-10-28: qty 2

## 2017-10-28 MED ORDER — SODIUM CHLORIDE 0.9 % IV SOLN: 250.0000 mL | INTRAVENOUS | Status: DC | PRN

## 2017-10-28 MED ORDER — VERAPAMIL HCL 2.5 MG/ML IV SOLN
INTRAVENOUS | Status: DC | PRN
  Administered 2017-10-28: 10 mL via INTRA_ARTERIAL

## 2017-10-28 MED ORDER — THIAMINE HCL 100 MG/ML IJ SOLN: 100.0000 mg | Freq: Every day | INTRAMUSCULAR | Status: DC

## 2017-10-28 MED ORDER — EZETIMIBE 10 MG PO TABS
10.0000 mg | ORAL_TABLET | Freq: Every day | ORAL | Status: DC
  Administered 2017-10-29 – 2017-10-30 (×2): 10 mg via ORAL
  Filled 2017-10-28 (×3): qty 1

## 2017-10-28 MED ORDER — CLOPIDOGREL BISULFATE 75 MG PO TABS
75.0000 mg | ORAL_TABLET | Freq: Every day | ORAL | Status: DC
  Administered 2017-10-29 – 2017-10-30 (×2): 75 mg via ORAL
  Filled 2017-10-28 (×4): qty 1

## 2017-10-28 MED ORDER — SODIUM CHLORIDE 0.9 % IV SOLN
INTRAVENOUS | Status: AC
  Administered 2017-10-28: 18:00:00 via INTRAVENOUS

## 2017-10-28 MED ORDER — FENTANYL CITRATE (PF) 100 MCG/2ML IJ SOLN
INTRAMUSCULAR | Status: AC
  Filled 2017-10-28: qty 2

## 2017-10-28 MED ORDER — HEPARIN (PORCINE) IN NACL 1000-0.9 UT/500ML-% IV SOLN
INTRAVENOUS | Status: AC
  Filled 2017-10-28: qty 1000

## 2017-10-28 MED ORDER — SODIUM CHLORIDE 0.9 % IV SOLN: INTRAVENOUS | Status: DC

## 2017-10-28 MED ORDER — LORAZEPAM 2 MG/ML IJ SOLN: 1.0000 mg | Freq: Four times a day (QID) | INTRAMUSCULAR | Status: DC | PRN

## 2017-10-28 MED ORDER — FOLIC ACID 1 MG PO TABS
1.0000 mg | ORAL_TABLET | Freq: Every day | ORAL | Status: DC
  Administered 2017-10-28 – 2017-10-30 (×3): 1 mg via ORAL
  Filled 2017-10-28 (×3): qty 1

## 2017-10-28 MED ORDER — NITROGLYCERIN 2 % TD OINT
0.5000 [in_us] | TOPICAL_OINTMENT | Freq: Once | TRANSDERMAL | Status: AC
  Administered 2017-10-28: 0.5 [in_us] via TOPICAL
  Filled 2017-10-28: qty 1

## 2017-10-28 MED ORDER — SODIUM CHLORIDE 0.9 % IV SOLN
250.0000 mL | INTRAVENOUS | Status: DC | PRN
Start: 2017-10-28 — End: 2017-10-30

## 2017-10-28 MED ORDER — ACETAMINOPHEN 325 MG PO TABS: 650.0000 mg | ORAL_TABLET | ORAL | Status: DC | PRN

## 2017-10-28 MED ORDER — MIRTAZAPINE 7.5 MG PO TABS
7.5000 mg | ORAL_TABLET | Freq: Every evening | ORAL | Status: DC | PRN
  Administered 2017-10-28 – 2017-10-29 (×2): 7.5 mg via ORAL
  Filled 2017-10-28 (×2): qty 1

## 2017-10-28 MED ORDER — HEPARIN (PORCINE) IN NACL 2-0.9 UNITS/ML
INTRAMUSCULAR | Status: AC | PRN
  Administered 2017-10-28 (×2): 500 mL via INTRA_ARTERIAL

## 2017-10-28 MED ORDER — IOPAMIDOL (ISOVUE-370) INJECTION 76%
INTRAVENOUS | Status: AC
  Filled 2017-10-28: qty 100

## 2017-10-28 MED ORDER — PANTOPRAZOLE SODIUM 40 MG PO TBEC
40.0000 mg | DELAYED_RELEASE_TABLET | Freq: Every day | ORAL | Status: DC
  Administered 2017-10-29: 40 mg via ORAL
  Filled 2017-10-28 (×2): qty 1

## 2017-10-28 MED ORDER — LORAZEPAM 1 MG PO TABS: 1.0000 mg | ORAL_TABLET | Freq: Four times a day (QID) | ORAL | Status: DC | PRN

## 2017-10-28 MED ORDER — IOPAMIDOL (ISOVUE-370) INJECTION 76%
100.0000 mL | Freq: Once | INTRAVENOUS | Status: AC | PRN
  Administered 2017-10-28: 100 mL via INTRAVENOUS

## 2017-10-28 MED ORDER — SODIUM CHLORIDE 0.9 % IV SOLN
Freq: Once | INTRAVENOUS | Status: AC
  Administered 2017-10-28: 13:00:00 via INTRAVENOUS

## 2017-10-28 MED ORDER — FENTANYL CITRATE (PF) 100 MCG/2ML IJ SOLN
INTRAMUSCULAR | Status: DC | PRN
  Administered 2017-10-28: 50 ug via INTRAVENOUS

## 2017-10-28 MED ORDER — LORAZEPAM 1 MG PO TABS: 0.0000 mg | ORAL_TABLET | Freq: Four times a day (QID) | ORAL | Status: DC

## 2017-10-28 MED ORDER — MIDAZOLAM HCL 2 MG/2ML IJ SOLN
INTRAMUSCULAR | Status: DC | PRN
  Administered 2017-10-28: 2 mg via INTRAVENOUS

## 2017-10-28 MED ORDER — IOPAMIDOL (ISOVUE-370) INJECTION 76%
INTRAVENOUS | Status: AC
  Filled 2017-10-28: qty 300

## 2017-10-28 MED ORDER — ROSUVASTATIN CALCIUM 40 MG PO TABS
40.0000 mg | ORAL_TABLET | Freq: Every day | ORAL | Status: DC
  Administered 2017-10-29 – 2017-10-30 (×2): 40 mg via ORAL
  Filled 2017-10-28 (×2): qty 2
  Filled 2017-10-28 (×2): qty 1
  Filled 2017-10-28: qty 2
  Filled 2017-10-28: qty 1

## 2017-10-28 MED ORDER — VITAMIN B-1 100 MG PO TABS
100.0000 mg | ORAL_TABLET | Freq: Every day | ORAL | Status: DC
  Administered 2017-10-28 – 2017-10-30 (×3): 100 mg via ORAL
  Filled 2017-10-28 (×3): qty 1

## 2017-10-28 MED ORDER — HEPARIN SODIUM (PORCINE) 1000 UNIT/ML IJ SOLN
INTRAMUSCULAR | Status: AC
  Filled 2017-10-28: qty 1

## 2017-10-28 MED ORDER — ADULT MULTIVITAMIN W/MINERALS CH
1.0000 | ORAL_TABLET | Freq: Every day | ORAL | Status: DC
  Administered 2017-10-28 – 2017-10-30 (×3): 1 via ORAL
  Filled 2017-10-28 (×3): qty 1

## 2017-10-28 MED ORDER — LIDOCAINE HCL (PF) 1 % IJ SOLN
INTRAMUSCULAR | Status: DC | PRN
  Administered 2017-10-28: 3 mL

## 2017-10-28 MED ORDER — IOPAMIDOL (ISOVUE-370) INJECTION 76%
INTRAVENOUS | Status: DC | PRN
  Administered 2017-10-28: 50 mL via INTRA_ARTERIAL

## 2017-10-28 MED ORDER — MORPHINE SULFATE (PF) 2 MG/ML IV SOLN
1.0000 mg | INTRAVENOUS | Status: DC | PRN
  Administered 2017-10-29: 1 mg via INTRAVENOUS
  Filled 2017-10-28: qty 1

## 2017-10-28 MED ORDER — ONDANSETRON HCL 4 MG/2ML IJ SOLN: 4.0000 mg | Freq: Four times a day (QID) | INTRAMUSCULAR | Status: DC | PRN

## 2017-10-28 MED ORDER — SODIUM CHLORIDE 0.9% FLUSH
3.0000 mL | Freq: Two times a day (BID) | INTRAVENOUS | Status: DC
  Administered 2017-10-28 – 2017-10-29 (×2): 3 mL via INTRAVENOUS

## 2017-10-28 MED ORDER — LISINOPRIL 5 MG PO TABS
5.0000 mg | ORAL_TABLET | Freq: Every day | ORAL | Status: DC
  Administered 2017-10-29 – 2017-10-30 (×2): 5 mg via ORAL
  Filled 2017-10-28 (×3): qty 1

## 2017-10-28 MED ORDER — LIDOCAINE HCL (PF) 1 % IJ SOLN
INTRAMUSCULAR | Status: AC
  Filled 2017-10-28: qty 30

## 2017-10-28 MED ORDER — HEPARIN SODIUM (PORCINE) 1000 UNIT/ML IJ SOLN
INTRAMUSCULAR | Status: DC | PRN
  Administered 2017-10-28: 4000 [IU] via INTRAVENOUS

## 2017-10-28 MED ORDER — ISOSORBIDE MONONITRATE ER 30 MG PO TB24
15.0000 mg | ORAL_TABLET | Freq: Every day | ORAL | Status: DC
  Administered 2017-10-28 – 2017-10-29 (×2): 15 mg via ORAL
  Filled 2017-10-28 (×2): qty 1

## 2017-10-28 MED ORDER — SODIUM CHLORIDE 0.9 % IV SOLN
INTRAVENOUS | Status: AC | PRN
  Administered 2017-10-28: 125 mL via INTRAVENOUS

## 2017-10-28 MED ORDER — ASPIRIN EC 81 MG PO TBEC
81.0000 mg | DELAYED_RELEASE_TABLET | Freq: Every day | ORAL | Status: DC
  Administered 2017-10-29 – 2017-10-30 (×2): 81 mg via ORAL
  Filled 2017-10-28 (×3): qty 1

## 2017-10-28 SURGICAL SUPPLY — 12 items
CATH INFINITI 5FR MULTPACK ANG (CATHETERS) ×2 IMPLANT
COVER PRB 48X5XTLSCP FOLD TPE (BAG) ×1 IMPLANT
COVER PROBE 5X48 (BAG) ×2
DEVICE RAD COMP TR BAND LRG (VASCULAR PRODUCTS) ×2 IMPLANT
GLIDESHEATH SLEND A-KIT 6F 22G (SHEATH) ×2 IMPLANT
GLIDESHEATH SLEND SS 6F .021 (SHEATH) ×2 IMPLANT
GUIDEWIRE INQWIRE 1.5J.035X260 (WIRE) ×1 IMPLANT
INQWIRE 1.5J .035X260CM (WIRE) ×2
KIT HEART LEFT (KITS) ×2 IMPLANT
PACK CARDIAC CATHETERIZATION (CUSTOM PROCEDURE TRAY) ×2 IMPLANT
TRANSDUCER W/STOPCOCK (MISCELLANEOUS) ×2 IMPLANT
TUBING CIL FLEX 10 FLL-RA (TUBING) ×2 IMPLANT

## 2017-10-28 NOTE — H&P (Addendum)
Cardiology Admission History and Physical:   Patient ID: [name]; MRN: [id number]; DOB: 02/25/1951   Admission date: 10/28/2017  Primary Care Provider: Venia Carbon, MD Primary Cardiologist: Ida Rogue, MD   Chief Complaint:  Chest pain  Patient Profile:   [name] is a 67 y.o. male with a history of CAD s/p CABG 1995 and multiple PCIs as outlined below, cardimyopathy (EF 35% in 2015), alcohol abuse, AAA (4.1cm by imaging this admission), HTN, HLD, COPD, chronic appearing mild hyponatremia, renal artery stenosis s/p prior L renal artery stenting (last duplex 2016 with stable 1-59% bilateral RAS, PAD noted on noninvasive imaging, diverticulitis whom we are asked to admit for chest pain by Dr. Gilford Raid.  History of Present Illness:   [name] had CABG 1995 with LIMA->LAD, VG->RI (known to be occluded), VG->AM->PDA. In 2004 he had Inf STEMI. VG->AM->PDA was found to be occluded treated with 2 BMS c/b acute thrombosis requiring 3 BMS. In 2005 he received DES to  LAD & LCX (VG's to PDA & RI 100%). In 2012 he had Acute MI ([place]) and received DES to LCX & LM. Last cath 2015 resulting in DESx2 to OM, also noting patent LIMA- LAD, (SVG-Cx and SVG-RCA were occluded by prior cath) EF 35% at that time, no follow-up echocardiogram since that time. Prior LVEF in 2012 was 50-55%. Last OV with Dr. Rockey Situ in 03/2017. Note indicates some prior orthostatic intolerance with higher doses of Imdur.   In general he feels he's been doing well lately. Although not formally exercising, he has remained fairly active laying a new deck in his backyard and weedwhacking without recent anginal symptoms (or claudication). He does report he drinks "more than I know I should" which is 6 12oz beers daily. His wife has h/o stroke and edema and so generally they do not add salt to meals. This morning he awoke around 2am, went to the bathroom, came back to bed, and noted sensation of SOB upon recumbency (sounds  like orthopnea). This persisted from 2am-6pm and he had to sit up in a recliner for comfort. Around 6am he began to feel intermittently hot alternating with cold and also had sensation of bilateral arm pain with substernal chest discomfort, sharp at times. He has felt some relief if he presses in the middle of the chest. EMS was called and per triage notes gave 4 doses of SL NTG, 8mg  morphine, 324mg  ASA. This brought pain down from 6/10 to 3/10. EKG nonacute. NTG ointment ordered. CT angio chest/abdomen/pelvis without acute pathology, 4.1cm infrarenal AAA, bilateral iliac disease without high grade stenosis, mesenteric arterial disease nonocclusive. Troponin neg x 2. Hgb 12.8 (prev 13.1-13.9), Na 134, glucose 110, Cr 0.79. Last lipids 12/2016 with high triglycerides of 362, LDL 63. VSS except mildly hypertensive. Reports compliance with meds. Denies recent unusual weight gain/loss, LEE, syncope, palpitations. Tele NSR.   He does not know if this feels like prior angina as it has been a) different each time and b) he doesn't really remember what it felt like.    Past Medical History:  Diagnosis Date  . AAA (abdominal aortic aneurysm) ([place])    a. Duplex 01/2012: stable infrarenal saccular AAA at 3.3cm x 3.2xm (f/u recommended 01/2013 per Dr. Rockey Situ)  . Alcohol abuse   . CAD (coronary artery disease)    a. 1995 s/p CABG x 4: LIMA->LAD, VG->RI (known to be occluded), VG->AM->PDA;  b. 05/2002 Inf STEMI: VG->AM->PDA 100% treated w/ 2 BMS complicated by acute thrombosis req 3  BMS;  c. 07/2003 DES to  LAD & LCX (VG's to PDA & RI 100%);  d. 12/2010 Acute MI ([place]): DES to LCX & LM , LIMA ok,;  e.12/2011 Cath: LM/LCX stents ok , LIMA patent.  . Cardiomyopathy ([place])    a. EF 35% by cath 2015.  . Diverticulitis    1/06 Diverticulitis--CT of pelvis--diffuse sigmoid divertic  . GERD (gastroesophageal reflux disease)   . Hyperlipidemia   . Hypertension   . PAD (peripheral artery disease) ([facility])    a. external iliac  and mesenteric stenosis noted by noninvasive imaging.  . Renal artery stenosis ([place])    a. 03/2011 PTA and stenting of L RA. b. last duplex 2016 with stable 1-59% bilateral RAS, incidental >50% R EIA stenosis    Past Surgical History:  Procedure Laterality Date  . ANGIOPLASTY  1997  . CARDIAC CATHETERIZATION     8/09  Cath--vein graft occlusions which are old--no acute changes  . CARDIAC CATHETERIZATION  11/13/2010   stent x 2 @ [place]  . CARDIAC CATHETERIZATION  04/05/2014   stent placement   . CLOSED REDUCTION SHOULDER DISLOCATION    . CORONARY ANGIOPLASTY  04/05/2014   stent placement OM 1  . CORONARY ARTERY BYPASS GRAFT    . CORONARY STENT PLACEMENT  7/12   2 stents--Promus element plus (everolimus eluting)--[facility] in [place]  . LEFT HEART CATHETERIZATION WITH CORONARY/GRAFT ANGIOGRAM N/A 01/04/2012   Procedure: LEFT HEART CATHETERIZATION WITH Beatrix Fetters;  Surgeon: Burnell Blanks, MD;  Location: [facility] CATH LAB;  Service: Cardiovascular;  Laterality: N/A;  . LEFT HEART CATHETERIZATION WITH CORONARY/GRAFT ANGIOGRAM N/A 04/05/2014   Procedure: LEFT HEART CATHETERIZATION WITH Beatrix Fetters;  Surgeon: Jettie Booze, MD;  Location: [facility] CATH LAB;  Service: Cardiovascular;  Laterality: N/A;  . PERCUTANEOUS CORONARY STENT INTERVENTION (PCI-S)  04/05/2014   Procedure: PERCUTANEOUS CORONARY STENT INTERVENTION (PCI-S);  Surgeon: Jettie Booze, MD;  Location: [facility] CATH LAB;  Service: Cardiovascular;;  OM1  . RENAL ANGIOGRAM N/A 04/16/2011   Procedure: RENAL ANGIOGRAM;  Surgeon: Burnell Blanks, MD;  Location: [facility] CATH LAB;  Service: Cardiovascular;  Laterality: N/A;  . RENAL ARTERY STENT  03/2011 ?     Medications Prior to Admission: Prior to Admission medications   Medication Sig Start Date End Date Taking? Authorizing Provider  aspirin 81 MG tablet Take 81 mg by mouth daily.   Yes [provider]  carvedilol (COREG) 3.125  MG tablet Take 1 tablet (3.125 mg total) by mouth 2 (two) times daily. 04/27/17  Yes Minna Merritts, MD  clopidogrel (PLAVIX) 75 MG tablet Take 1 tablet (75 mg total) by mouth daily. 09/29/17  Yes Minna Merritts, MD  ezetimibe (ZETIA) 10 MG tablet TAKE 1 TABLET BY MOUTH DAILY 10/26/17  Yes Gollan, Kathlene November, MD  isosorbide mononitrate (IMDUR) 30 MG 24 hr tablet TAKE ONE-HALF TABLET BY MOUTH AT BEDTIME 08/29/17  Yes Gollan, Kathlene November, MD  lisinopril (PRINIVIL,ZESTRIL) 5 MG tablet TAKE 1 TABLET BY MOUTH EVERY DAY 10/12/17  Yes Gollan, Kathlene November, MD  mirtazapine (REMERON) 15 MG tablet TAKE 1 TABLET BY MOUTH EVERY NIGHT AT BEDTIME AS NEEDED FOR SLEEP. Patient taking differently: TAKE 7.5 mg TABLET BY MOUTH EVERY NIGHT AT BEDTIME AS NEEDED FOR SLEEP. 04/18/17  Yes Venia Carbon, MD  naproxen sodium (ALEVE) 220 MG tablet Take 220 mg by mouth daily as needed (pain).   Yes [provider]  nitroGLYCERIN (NITROSTAT) 0.4 MG SL tablet Place 1  tablet (0.4 mg total) under the tongue every 5 (five) minutes as needed for chest pain. 06/01/17  Yes Minna Merritts, MD  omeprazole (PRILOSEC OTC) 20 MG tablet Take 20 mg by mouth daily.   Yes [provider]  OVER THE COUNTER MEDICATION Take 1 capsule by mouth daily. CBD   Yes [provider]  rosuvastatin (CRESTOR) 40 MG tablet TAKE 1 TABLET BY MOUTH DAILY 07/04/17  Yes Gollan, Kathlene November, MD     Allergies:    Allergies  Allergen Reactions  . Metoclopramide     Other reaction(s): Unknown  . Metoprolol Tartrate Hives and Itching    Social History:   Social History   Socioeconomic History  . Marital status: Married    Spouse name: Not on file  . Number of children: 3  . Years of education: Not on file  . Highest education level: Not on file  Occupational History  . 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 Financial resource strain: Not on file  . Food insecurity:    Worry: Not  on file    Inability: Not on file  . Transportation needs:    Medical: Not on file    Non-medical: Not on file  Tobacco Use  . Smoking status: Former Smoker    Packs/day: 3.00    Years: 25.00    Pack years: 75.00    Types: Cigarettes    Last attempt to quit: 05/28/1993    Years since quitting: 24.4  . Smokeless tobacco: Never Used  Substance and Sexual Activity  . Alcohol use: Yes    Alcohol/week: 25.2 oz    Types: 42 Cans of beer per week  . Drug use: No  . Sexual activity: Not on file  Lifestyle  . Physical activity:    Days per week: Not on file    Minutes per session: Not on file  . Stress: Not on file  Relationships  . Social connections:    Talks on phone: Not on file    Gets together: Not on file    Attends religious service: Not on file    Active member of club or organization: Not on file    Attends meetings of clubs or organizations: Not on file    Relationship status: Not on file  . Intimate partner violence:    Fear of current or ex partner: Not on file    Emotionally abused: Not on file    Physically abused: Not on file    Forced sexual activity: Not on file  Other Topics Concern  . Not on file  Social History Narrative   No living will   No health care POA but requests wife--then children   Would accept resuscitation   Not sure about tube feeds    Family History:   The patient's family history includes Coronary artery disease in his brother, father, mother, and sister; Heart attack in his father, maternal grandfather, and mother; Hypertension in his mother. There is no history of Diabetes or Cancer.    ROS:  Please see the history of present illness.  All other ROS reviewed and negative.     Physical Exam/Data:   Vitals:   10/28/17 0744 10/28/17 0930 10/28/17 1030 10/28/17 1100  BP:  (!) 121/105 (!) 139/101 (!) 143/91  Pulse:  (!) 53 (!) 53 (!) 56  Resp:  17 18 15   Temp:      TempSrc:  SpO2:  96% 100% 100%  Weight: 167 lb (75.8 kg)       Height: 5\' 5"  (1.651 m)      No intake or output data in the 24 hours ending 10/28/17 1208 Filed Weights   10/28/17 0744  Weight: 167 lb (75.8 kg)   Body mass index is 27.79 kg/m.  General:  Well nourished, well developed overweight WM, in no acute distress HEENT: normal Lymph: no adenopathy Neck: no JVD Endocrine:  No thryomegaly Vascular: No carotid bruits; pedal pulses diminished bilaterally Cardiac:  normal S1, S2; RRR; no murmur Lungs:  clear to auscultation bilaterally, no wheezing, rhonchi or rales  Abd: soft, nontender, no hepatomegaly  Ext: no lower extremity edema Musculoskeletal:  No deformities, BUE and BLE strength normal and equal Skin: warm and dry  Neuro:  CNs 2-12 intact, no focal abnormalities noted Psych:  Normal affect    EKG:  The ECG that was done today was personally reviewed and demonstrates NSR 66bpm, nonspecific TW changes in III, avF but nonacute. Inf Q's. No sig change from prior.   Relevant CV Studies: Cath 03/2014   PROCEDURE:  Left heart catheterization with selective coronary angiography, left ventriculogram.  Bypass angiography, PCI of the CTO of the OM1  INDICATIONS:  Unstable angina  The risks, benefits, and details of the procedure were explained to the patient.  The patient verbalized understanding and wanted to proceed.  Informed written consent was obtained.  PROCEDURE TECHNIQUE:  After Xylocaine anesthesia a 71F slender sheath was placed in the right radial artery with a single anterior needle wall stick.   IV Heparin was given.  Right coronary angiography was done using a Judkins R4 guide catheter.  Left coronary angiography was done using a Judkins L3.5 guide catheter.  Left ventriculography was done using a pigtail catheter.  A TR band was used for hemostasis.   CONTRAST:  Total of 155 cc.  COMPLICATIONS:  None.    HEMODYNAMICS:  Aortic pressure was 140/70; LV pressure was 140/4; LVEDP 19.  There was no gradient between the  left ventricle and aorta.    ANGIOGRAPHIC DATA:   The left main coronary artery has a patent stent.  The left anterior descending artery is moderately diseased proximally. The mid vessel is occluded. There are large septal perforators which provide collaterals to the distal RCA system. The LIMA to LAD is widely patent  The left circumflex artery is a large vessel proximally. The proximal stents are patent. The OM1 is occluded. The proximal portion of the OM1 contains an old bare-metal stent.  The remainder of the circumflex is moderate-sized. There are collaterals to the RCA system.  The SVG to circumflex is occluded by prior cath.  The right coronary artery is  occluded proximally. The SVG to RCA is occluded by prior cath.  LEFT VENTRICULOGRAM:  Left ventricular angiogram was done in the 30 RAO projection and revealed moderately decreased systolic function with an estimated ejection fraction of 35%.  There is a basal inferior aneurysm with akinesis in that territory. LVEDP was 19 mmHg.  PCI NARRATIVE: And EBU 3 guide catheter was used to engage the left main. This did not fit as well as would've liked but it was adequate. A fielder XT wire was advanced across the occlusion in the OM1 stent.  We tried to get a 1.5 balloon to the lesion. Eventually, we had success. Several balloon inflations were performed. There was improved flow into the distal vessel. The over-the-wire balloon was  taken out. A pro-water wire was placed into the true circumflex. A 2.0 balloon was advanced. Multiple balloon inflations were performed. This revealed 2 areas of significant disease, the first within the stent and the second into no above vessel distal to the stent. Multiple balloon inflations were performed. We attempted to get a 2.25 x 28 alpine drug-eluting stent to the lesion. It would not cross the proximal stented portion.  A 2.5 x 12 balloon was used to further predilate. Subsequent, a 2.25 x 12 and then 2.25 x  8 Promus drug-eluting stents were deployed in overlapping fashion in the OM1. The stents were postdilated with the stent balloon up to 2.5 mm in diameter. We did not cover the ostium of the remainder of the circumflex since this vessel provides collaterals. There is already one layer stent there. We did take the stent balloon and ballooned the ostium of the OM1. There was an excellent angiographic result. TIMI-3 flow was restored. Several doses of intracoronary nitroglycerin were administered.   IMPRESSIONS:  1.  Patent stents in the left main coronary artery. 2.  Occluded mid  left anterior descending artery. Patent LIMA to LAD. 3. Patent left circumflex artery . Prior bare-metal stent that extends into the OM1 was occluded in the distal portion, just after the ostium of OM1. This area was successfully treated with overlapping Promus drug-eluting stents, 2.25 x 12 and 2.25 x 8, postdilated to 2.5 mm in diameter with the stent balloon.. 4.  Occluded right coronary artery. 5.  Moderately decreased left ventricular systolic function.  LVEDP 19 mmHg.  Ejection fraction 35%.  RECOMMENDATION:   Continue dual antiplatelet therapy indefinitely. He'll need aggressive secondary prevention and therapy for heart failure. His EF appears to have decreased since the last catheterization. This may have been due to the occlusion of the OM1. Hopefully, EF well improved. He'll be watched overnight.     Laboratory Data:  Chemistry Recent Labs  Lab 10/28/17 0744  NA 134*  K 4.2  CL 103  CO2 25  GLUCOSE 110*  BUN 5*  CREATININE 0.79  CALCIUM 9.0  GFRNONAA >60  GFRAA >60  ANIONGAP 6    No results for input(s): PROT, ALBUMIN, AST, ALT, ALKPHOS, BILITOT in the last 168 hours. Hematology Recent Labs  Lab 10/28/17 0744  WBC 6.3  RBC 4.08*  HGB 12.8*  HCT 38.8*  MCV 95.1  MCH 31.4  MCHC 33.0  RDW 14.5  PLT 238   Cardiac EnzymesNo results for input(s): TROPONINI in the last 168 hours.  Recent  Labs  Lab 10/28/17 0745 10/28/17 1128  TROPIPOC 0.00 0.00    BNPNo results for input(s): BNP, PROBNP in the last 168 hours.  DDimer No results for input(s): DDIMER in the last 168 hours.  Radiology/Studies:  Dg Chest 2 View  Result Date: 10/28/2017 CLINICAL DATA:  67 year old male with chest pain and shortness of breath onset today. Remote heart surgery in 1995. EXAM: CHEST - 2 VIEW COMPARISON:  Chest radiographs 04/04/2014 and earlier. FINDINGS: Lower lung volumes today. Mediastinal contours remain normal. Stable sternotomy wires. Visualized tracheal air column is within normal limits. No pneumothorax, pulmonary edema, pleural effusion or confluent pulmonary opacity. Stable visualized osseous structures. Negative visible bowel gas pattern. IMPRESSION: Lower lung volumes.  No acute cardiopulmonary abnormality. Electronically Signed   By: Genevie Ann M.D.   On: 10/28/2017 08:05   Ct Angio Chest/abd/pel For Dissection W And/or Wo Contrast  Result Date: 10/28/2017 CLINICAL DATA:  67 year old male with a  history of chest pain and shortness of breath EXAM: CT ANGIOGRAPHY CHEST, ABDOMEN AND PELVIS TECHNIQUE: Multidetector CT imaging through the chest, abdomen and pelvis was performed using the standard protocol during bolus administration of intravenous contrast. Multiplanar reconstructed images and MIPs were obtained and reviewed to evaluate the vascular anatomy. CONTRAST:  193mL ISOVUE-370 IOPAMIDOL (ISOVUE-370) INJECTION 76% COMPARISON:  None. FINDINGS: CTA CHEST FINDINGS Cardiovascular: Heart: No cardiomegaly. No pericardial fluid/thickening. Native coronary calcifications. Surgical changes of median sternotomy and CABG. The lima graft appears patent. More superior bypass graft is occluded beyond the origin (image 53 of series 7). Indeterminate chronicity. More inferior bypass graft is occluded beyond the origin (image 72 of series 7). Indeterminate chronicity. Aorta: No intramural hematoma or periaortic  fluid. Minimal calcifications of the aortic valve. No aneurysm of the thoracic aorta. No dissection flap. Atherosclerotic calcifications of the branch vessels without significant stenosis. Cervical arteries at the lower neck are patent. Moderate atherosclerotic changes of the descending thoracic aorta. Diameter of the aorta at the hiatus measures 2.6 cm. Pulmonary arteries: No central, lobar, segmental, or proximal subsegmental filling defects. Mediastinum/Nodes: No mediastinal adenopathy. Unremarkable appearance of the thoracic esophagus. Unremarkable appearance of the thoracic inlet and thyroid. Lungs/Pleura: Central airways are clear. No pleural effusion. No confluent airspace disease. No pneumothorax. Musculoskeletal: No acute displaced fracture. Degenerative changes of the spine. Review of the MIP images confirms the above findings. CTA ABDOMEN AND PELVIS FINDINGS VASCULAR Aorta: Moderate atherosclerotic changes of the abdominal aorta. Infrarenal abdominal aortic aneurysm measuring 4.1 cm at the level of the IMA. Circumferential thrombus. No periaortic fluid. No dissection of the abdominal aorta. Celiac: Atherosclerotic changes of the celiac artery. There is perhaps 50% narrowing at the origin. Branch vessels are patent. SMA: Atherosclerotic changes at the origin of the superior mesenteric artery with estimated 50% stenosis. Branches are patent. Renals: Atherosclerotic changes at the origin of the right renal artery, without high-grade stenosis identified. The main left renal artery demonstrates prior stenting, with patency of the stent maintained. IMA: IMA is patent. Right lower extremity: Atherosclerotic changes of the right iliac system. Hypogastric artery is patent. External iliac artery patent. Proximal femoral vasculature patent. Left lower extremity: Atherosclerotic changes of the left iliac system. Hypogastric artery is patent and narrowed at the origin. External iliac artery is patent. Common femoral  artery patent. Proximal superficial femoral artery is patent. Veins: Unremarkable appearance of the venous system. Review of the MIP images confirms the above findings. NON-VASCULAR Hepatobiliary: Unremarkable appearance of the liver. Calcified cholelithiasis. No associated inflammatory changes. Pancreas: Unremarkable pancreas Spleen: Unremarkable spleen Adrenals/Urinary Tract: Unremarkable adrenal glands Right: No hydronephrosis. Symmetric perfusion to the left. No nephrolithiasis. Unremarkable course of the right ureter. Left: No hydronephrosis. Symmetric perfusion to the right. No nephrolithiasis. Unremarkable course of the left ureter. Unremarkable appearance of the urinary bladder . Stomach/Bowel: Unremarkable appearance of the stomach. Unremarkable appearance of small bowel. No evidence of obstruction. Colonic diverticula. No associated inflammatory changes. Normal appendix. Lymphatic: No lymphadenopathy. Mesenteric: No free air.  No free fluid.  No mesenteric adenopathy. Reproductive: Unremarkable appearance of the pelvic organs. Other: Small fat containing umbilical hernia. Musculoskeletal: No acute fracture no evidence of acute aortic syndrome as etiology for chest pain. IMPRESSION: No evidence of acute aortic syndrome as an etiology for chest pain. Surgical changes of prior median sternotomy and 3 vessel CABG. The CT demonstrates apparent occlusions of 2 of the patient's coronary bypass grafts, indeterminate chronicity. The LIMA graft appears patent. Infrarenal abdominal aortic aneurysm, measuring 4.1 cm.  Aortic aneurysm NOS (ICD10-I71.9). Aortic atherosclerosis and associated bilateral iliac disease without evidence of occlusion or high-grade stenosis. Aortic Atherosclerosis (ICD10-I70.0). Bilateral renal artery disease, including patent left renal artery stent. Mesenteric arterial disease, nonocclusive. Colonic diverticular disease without evidence of acute diverticulitis. Signed, Dulcy Fanny. Dellia Nims,  R[facility] Radiology Electronically Signed   By: Corrie Mckusick D.O.   On: 10/28/2017 10:56    Assessment and Plan:   1. Chest pain/orthopnea with known CAD and cardiomyopathy - mixed typical/atypical features. No recent exertional angina preceding event. Despite ongoing persistent CP since 6am, troponins are negative x 2 and EKG is nonacute. Noninvasive imaging would be somewhat challenging to interpret given his complex coronary anatomy history. Will hold off heparin given negative enzymes. Per d/w MD, plan cardiac cath today to fully evaluate. Dr. Tamala Julian discussed risks/benefits with patient who is in agreement. Anticipate continuing ASA, Plavix, statin, Zetia. HR upper 40s/low 50s at time so will hold beta blocker and monitor. He is on omeprazole which will need to be stopped and switched to Protonix given that it decreases Plavix efficacy. It also appears he is taking a CBD oil supplement which can also decrease Plavix efficacy. This will need to be re-visited at time of discharge.  2. Cardiomyopathy - no recent echo on file, will obtain. He describes what sounds like orthopnea preceding onset of chest pain. However, this could also represent a variant of angina decubitus. He was laid back for 5 minutes in the exam room in ER and tolerated this OK. Check BNP. May benefit from trial of diuretic if no findings on cath, although no other overt signs of failure on exam.  3. Essential HTN - mildly hypertensive in ED, follow. Given contrast, will hold ACEI today and resume tomorrow.  4. Sinus bradycardia - HR in ED persistenting in upper 40s/low 50s. Has not had beta blocker yet today. Will hold carvedilol and monitor on telemetry before resuming. Check thyroid.  5. Alcohol abuse - counseled on importance of cutting down as it may have contributed to low EF and can increase bleeding risk. Place on CIWA.  6. PAD - CTA showed patent L renal artery  stent, nonocclusive mesenteric disease and iliac disease. He has no claudication. Continue risk factor modification. L radial pulse nonpalpable, distal pulses are also decreased. Likely needs R femoral access.  7. Mild normocytic anemia  - no bleeding reported. Will trend. Standing order for hemoccult.  Severity of Illness: The appropriate patient status for this patient is OBSERVATION. Observation status is judged to be reasonable and necessary in order to provide the required intensity of service to ensure the patient's safety. The patient's presenting symptoms, physical exam findings, and initial radiographic and laboratory data in the context of their medical condition is felt to place them at decreased risk for further clinical deterioration. Furthermore, it is anticipated that the patient will be medically stable for discharge from the hospital within 2 midnights of admission. The following factors support the patient status of observation.   " The patient's presenting symptoms include CP, orthopnea. " The physical exam findings include overweight stature. " The initial radiographic and laboratory data are negative for troponin leak, CT showing PAD and known CAD.  Note: I did release orders in ER so that cath lab team will be able to see / act on admit orders after cath.    For questions or updates, please contact [place] Please consult [email or web address] for contact info under Cardiology/STEMI.  Signed, Charlie Pitter, PA-C  10/28/2017 12:08 PM

## 2017-10-28 NOTE — ED Notes (Signed)
ED Provider at bedside. 

## 2017-10-28 NOTE — ED Notes (Signed)
Patient transported to CT 

## 2017-10-28 NOTE — ED Triage Notes (Signed)
Pt c/o chest pain and shortness of breath that began at around 3am and became worse at 6am. Per EMS, pt's chest pain located in left side of chest radiating down left arm. Pt also c/o back pain radiating to left shoulder. Given 4 doses of 0.4mg  nitro, 8mg  morphine, and 324mg  aspirin. Vital signs stable at this time. Pre-nitroglycerin pt rated chest pain 6/10. Post nitro chest pain level 3/10.

## 2017-10-28 NOTE — ED Notes (Signed)
Dr. Tamala Julian interventional cardiologist at the bedside.

## 2017-10-28 NOTE — ED Notes (Signed)
Pt returned to room from CT

## 2017-10-28 NOTE — ED Provider Notes (Signed)
[facility] EMERGENCY DEPARTMENT Provider Note   CSN: [id number] Arrival date & time: 10/28/17  1884     History   Chief Complaint Chief Complaint  Patient presents with  . Chest Pain  . Shortness of Breath    HPI [name] is a 67 y.o. male.  Pt presents to the ED today with cp and sob.  The pt said cp and sob started around 0300.  He could not fall asleep.  Pain became worse around 0600.  The pt also has back pain radiating to his left shoulder.  The pt was given nitro times 4, 324 mg asa, and 8 mg morphine by ems.  The pain has improved.  The pt does have a significant past medical hx significant for cad with bypass and multiple stents.  He also has a hx of an AAA that is being followed by his cardiologist.     Past Medical History:  Diagnosis Date  . AAA (abdominal aortic aneurysm) ([place])    a. Duplex 01/2012: stable infrarenal saccular AAA at 3.3cm x 3.2xm (f/u recommended 01/2013 per Dr. Rockey Situ)  . Alcohol abuse   . CAD (coronary artery disease)    a. 1995 s/p CABG x 4: LIMA->LAD, VG->RI (known to be occluded), VG->AM->PDA;  b. 05/2002 Inf STEMI: VG->AM->PDA 100% treated w/ 2 BMS complicated by acute thrombosis req 3 BMS;  c. 07/2003 DES to  LAD & LCX (VG's to PDA & RI 100%);  d. 12/2010 Acute MI ([place]): DES to LCX & LM , LIMA ok,;  e.12/2011 Cath: LM/LCX stents ok , LIMA patent.  . Cardiomyopathy ([place])    a. EF 35% by cath 2015.  . Diverticulitis    1/06 Diverticulitis--CT of pelvis--diffuse sigmoid divertic  . GERD (gastroesophageal reflux disease)   . Hyperlipidemia   . Hypertension   . PAD (peripheral artery disease) ([facility])    a. external iliac and mesenteric stenosis noted by noninvasive imaging.  . Renal artery stenosis ([place])    a. 03/2011 PTA and stenting of L RA. b. last duplex 2016 with stable 1-59% bilateral RAS, incidental >50% R EIA stenosis    Patient Active Problem List   Diagnosis Date Noted  . Unstable angina ([place]) 10/28/2017  .  Peripheral vascular disease ([place]) 01/07/2017  . Orthostasis 08/19/2016  . Chronic dental pain 02/09/2016  . Advance directive discussed with patient 01/05/2016  . CAD (coronary artery disease) of artery bypass graft 04/04/2014  . Routine general medical examination at a health care facility 12/19/2013  . AAA (abdominal aortic aneurysm) without rupture ([place]) 09/20/2013  . History of MI (myocardial infarction) 09/20/2013  . Renal artery stenosis ([place]) 04/01/2011  . Sleep disorder 12/30/2010  . Hyperlipidemia LDL goal <70 12/21/2007  . Essential hypertension 12/21/2007  . Atherosclerotic heart disease of native coronary artery with angina pectoris ([place]) 12/21/2007  . GERD 12/21/2007  . DIVERTICULAR DISEASE 05/18/2004    Past Surgical History:  Procedure Laterality Date  . ANGIOPLASTY  1997  . CARDIAC CATHETERIZATION     8/09  Cath--vein graft occlusions which are old--no acute changes  . CARDIAC CATHETERIZATION  11/13/2010   stent x 2 @ [place]  . CARDIAC CATHETERIZATION  04/05/2014   stent placement   . CLOSED REDUCTION SHOULDER DISLOCATION    . CORONARY ANGIOPLASTY  04/05/2014   stent placement OM 1  . CORONARY ARTERY BYPASS GRAFT    . CORONARY STENT PLACEMENT  7/12   2 stents--Promus element plus (everolimus eluting)--[facility]  in [place]  . LEFT HEART CATHETERIZATION WITH CORONARY/GRAFT ANGIOGRAM N/A 01/04/2012   Procedure: LEFT HEART CATHETERIZATION WITH Beatrix Fetters;  Surgeon: Burnell Blanks, MD;  Location[facility] CATH LAB;  Service: Cardiovascular;  Laterality: N/A;  . LEFT HEART CATHETERIZATION WITH CORONARY/GRAFT ANGIOGRAM N/A 04/05/2014   Procedure: LEFT HEART CATHETERIZATION WITH Beatrix Fetters;  Surgeon: Jettie Booze, MD;  Location: [facility] CATH LAB;  Service: Cardiovascular;  Laterality: N/A;  . PERCUTANEOUS CORONARY STENT INTERVENTION (PCI-S)  04/05/2014   Procedure: PERCUTANEOUS CORONARY STENT INTERVENTION (PCI-S);  Surgeon:  Jettie Booze, MD;  Location: [facility] CATH LAB;  Service: Cardiovascular;;  OM1  . RENAL ANGIOGRAM N/A 04/16/2011   Procedure: RENAL ANGIOGRAM;  Surgeon: Burnell Blanks, MD;  Location: [facility] CATH LAB;  Service: Cardiovascular;  Laterality: N/A;  . RENAL ARTERY STENT  03/2011 ?        Home Medications    Prior to Admission medications   Medication Sig Start Date End Date Taking? Authorizing Provider  aspirin 81 MG tablet Take 81 mg by mouth daily.   Yes [provider]  carvedilol (COREG) 3.125 MG tablet Take 1 tablet (3.125 mg total) by mouth 2 (two) times daily. 04/27/17  Yes Minna Merritts, MD  clopidogrel (PLAVIX) 75 MG tablet Take 1 tablet (75 mg total) by mouth daily. 09/29/17  Yes Minna Merritts, MD  ezetimibe (ZETIA) 10 MG tablet TAKE 1 TABLET BY MOUTH DAILY 10/26/17  Yes Gollan, Kathlene November, MD  isosorbide mononitrate (IMDUR) 30 MG 24 hr tablet TAKE ONE-HALF TABLET BY MOUTH AT BEDTIME 08/29/17  Yes Gollan, Kathlene November, MD  lisinopril (PRINIVIL,ZESTRIL) 5 MG tablet TAKE 1 TABLET BY MOUTH EVERY DAY 10/12/17  Yes Gollan, Kathlene November, MD  mirtazapine (REMERON) 15 MG tablet TAKE 1 TABLET BY MOUTH EVERY NIGHT AT BEDTIME AS NEEDED FOR SLEEP. Patient taking differently: TAKE 7.5 mg TABLET BY MOUTH EVERY NIGHT AT BEDTIME AS NEEDED FOR SLEEP. 04/18/17  Yes Venia Carbon, MD  naproxen sodium (ALEVE) 220 MG tablet Take 220 mg by mouth daily as needed (pain).   Yes [provider]  nitroGLYCERIN (NITROSTAT) 0.4 MG SL tablet Place 1 tablet (0.4 mg total) under the tongue every 5 (five) minutes as needed for chest pain. 06/01/17  Yes Minna Merritts, MD  omeprazole (PRILOSEC OTC) 20 MG tablet Take 20 mg by mouth daily.   Yes [provider]  OVER THE COUNTER MEDICATION Take 1 capsule by mouth daily. CBD   Yes [provider]  rosuvastatin (CRESTOR) 40 MG tablet TAKE 1 TABLET BY MOUTH DAILY 07/04/17  Yes Gollan, Kathlene November, MD    Family History Family History    Problem Relation Age of Onset  . Coronary artery disease Mother        Died MI age 22  . Hypertension Mother   . Heart attack Mother   . Coronary artery disease Sister        Living  . Coronary artery disease Brother        Living  . Coronary artery disease Father        Died MI age 57  . Heart attack Father   . Heart attack Maternal Grandfather   . Diabetes Neg Hx   . Cancer Neg Hx        prostate or colon    Social History Social History   Tobacco Use  . Smoking status: Former Smoker    Packs/day: 3.00    Years: 25.00  Pack years: 75.00    Types: Cigarettes    Last attempt to quit: 05/28/1993    Years since quitting: 24.4  . Smokeless tobacco: Never Used  Substance Use Topics  . Alcohol use: Yes    Alcohol/week: 25.2 oz    Types: 42 Cans of beer per week  . Drug use: No     Allergies   Metoclopramide and Metoprolol tartrate   Review of Systems Review of Systems  Respiratory: Positive for shortness of breath.   Cardiovascular: Positive for chest pain.  All other systems reviewed and are negative.    Physical Exam Updated Vital Signs BP (!) 157/93   Pulse (!) 49   Temp 97.7 F (36.5 C) (Oral)   Resp 20   Ht 5\' 5"  (1.651 m)   Wt 75.8 kg (167 lb)   SpO2 100%   BMI 27.79 kg/m   Physical Exam  Constitutional: He is oriented to person, place, and time. He appears well-developed and well-nourished.  HENT:  Head: Normocephalic and atraumatic.  Eyes: Pupils are equal, round, and reactive to light. EOM are normal.  Neck: Normal range of motion. Neck supple.  Cardiovascular: Normal rate, regular rhythm and normal pulses.  Pulmonary/Chest: Effort normal and breath sounds normal.  Abdominal: Soft. Bowel sounds are normal.  Musculoskeletal: Normal range of motion.       Right lower leg: Normal.       Left lower leg: Normal.  Neurological: He is alert and oriented to person, place, and time.  Skin: Skin is warm and dry. Capillary refill takes less than  2 seconds.  Psychiatric: He has a normal mood and affect. His behavior is normal.  Nursing note and vitals reviewed.    ED Treatments / Results  Labs (all labs ordered are listed, but only abnormal results are displayed) Labs Reviewed  BASIC METABOLIC PANEL - Abnormal; Notable for the following components:      Result Value   Sodium 134 (*)    Glucose, Bld 110 (*)    BUN 5 (*)    All other components within normal limits  CBC - Abnormal; Notable for the following components:   RBC 4.08 (*)    Hemoglobin 12.8 (*)    HCT 38.8 (*)    All other components within normal limits  BRAIN NATRIURETIC PEPTIDE  PROTIME-INR  HIV ANTIBODY (ROUTINE TESTING)  TROPONIN I  TROPONIN I  TROPONIN I  HEPATIC FUNCTION PANEL  OCCULT BLOOD X 1 CARD TO LAB, STOOL  I-STAT TROPONIN, ED  I-STAT TROPONIN, ED    EKG EKG Interpretation  Date/Time:  Friday October 28 2017 07:37:49 EDT Ventricular Rate:  66 PR Interval:    QRS Duration: 97 QT Interval:  429 QTC Calculation: 450 R Axis:   55 Text Interpretation:  Sinus rhythm Confirmed by Isla Pence [phone]) on 10/28/2017 7:42:49 AM Also confirmed by Isla Pence [phone]), editor Shon Hale [phone])  on 10/28/2017 12:17:20 PM   Radiology Dg Chest 2 View  Result Date: 10/28/2017 CLINICAL DATA:  67 year old male with chest pain and shortness of breath onset today. Remote heart surgery in 1995. EXAM: CHEST - 2 VIEW COMPARISON:  Chest radiographs 04/04/2014 and earlier. FINDINGS: Lower lung volumes today. Mediastinal contours remain normal. Stable sternotomy wires. Visualized tracheal air column is within normal limits. No pneumothorax, pulmonary edema, pleural effusion or confluent pulmonary opacity. Stable visualized osseous structures. Negative visible bowel gas pattern. IMPRESSION: Lower lung volumes.  No acute cardiopulmonary abnormality. Electronically Signed   By:  Genevie Ann M.D.   On: 10/28/2017 08:05   Ct Angio Chest/abd/pel For Dissection W  And/or Wo Contrast  Result Date: 10/28/2017 CLINICAL DATA:  67 year old male with a history of chest pain and shortness of breath EXAM: CT ANGIOGRAPHY CHEST, ABDOMEN AND PELVIS TECHNIQUE: Multidetector CT imaging through the chest, abdomen and pelvis was performed using the standard protocol during bolus administration of intravenous contrast. Multiplanar reconstructed images and MIPs were obtained and reviewed to evaluate the vascular anatomy. CONTRAST:  160mL ISOVUE-370 IOPAMIDOL (ISOVUE-370) INJECTION 76% COMPARISON:  None. FINDINGS: CTA CHEST FINDINGS Cardiovascular: Heart: No cardiomegaly. No pericardial fluid/thickening. Native coronary calcifications. Surgical changes of median sternotomy and CABG. The lima graft appears patent. More superior bypass graft is occluded beyond the origin (image 53 of series 7). Indeterminate chronicity. More inferior bypass graft is occluded beyond the origin (image 72 of series 7). Indeterminate chronicity. Aorta: No intramural hematoma or periaortic fluid. Minimal calcifications of the aortic valve. No aneurysm of the thoracic aorta. No dissection flap. Atherosclerotic calcifications of the branch vessels without significant stenosis. Cervical arteries at the lower neck are patent. Moderate atherosclerotic changes of the descending thoracic aorta. Diameter of the aorta at the hiatus measures 2.6 cm. Pulmonary arteries: No central, lobar, segmental, or proximal subsegmental filling defects. Mediastinum/Nodes: No mediastinal adenopathy. Unremarkable appearance of the thoracic esophagus. Unremarkable appearance of the thoracic inlet and thyroid. Lungs/Pleura: Central airways are clear. No pleural effusion. No confluent airspace disease. No pneumothorax. Musculoskeletal: No acute displaced fracture. Degenerative changes of the spine. Review of the MIP images confirms the above findings. CTA ABDOMEN AND PELVIS FINDINGS VASCULAR Aorta: Moderate atherosclerotic changes of the  abdominal aorta. Infrarenal abdominal aortic aneurysm measuring 4.1 cm at the level of the IMA. Circumferential thrombus. No periaortic fluid. No dissection of the abdominal aorta. Celiac: Atherosclerotic changes of the celiac artery. There is perhaps 50% narrowing at the origin. Branch vessels are patent. SMA: Atherosclerotic changes at the origin of the superior mesenteric artery with estimated 50% stenosis. Branches are patent. Renals: Atherosclerotic changes at the origin of the right renal artery, without high-grade stenosis identified. The main left renal artery demonstrates prior stenting, with patency of the stent maintained. IMA: IMA is patent. Right lower extremity: Atherosclerotic changes of the right iliac system. Hypogastric artery is patent. External iliac artery patent. Proximal femoral vasculature patent. Left lower extremity: Atherosclerotic changes of the left iliac system. Hypogastric artery is patent and narrowed at the origin. External iliac artery is patent. Common femoral artery patent. Proximal superficial femoral artery is patent. Veins: Unremarkable appearance of the venous system. Review of the MIP images confirms the above findings. NON-VASCULAR Hepatobiliary: Unremarkable appearance of the liver. Calcified cholelithiasis. No associated inflammatory changes. Pancreas: Unremarkable pancreas Spleen: Unremarkable spleen Adrenals/Urinary Tract: Unremarkable adrenal glands Right: No hydronephrosis. Symmetric perfusion to the left. No nephrolithiasis. Unremarkable course of the right ureter. Left: No hydronephrosis. Symmetric perfusion to the right. No nephrolithiasis. Unremarkable course of the left ureter. Unremarkable appearance of the urinary bladder . Stomach/Bowel: Unremarkable appearance of the stomach. Unremarkable appearance of small bowel. No evidence of obstruction. Colonic diverticula. No associated inflammatory changes. Normal appendix. Lymphatic: No lymphadenopathy. Mesenteric: No  free air.  No free fluid.  No mesenteric adenopathy. Reproductive: Unremarkable appearance of the pelvic organs. Other: Small fat containing umbilical hernia. Musculoskeletal: No acute fracture no evidence of acute aortic syndrome as etiology for chest pain. IMPRESSION: No evidence of acute aortic syndrome as an etiology for chest pain. Surgical changes of prior median  sternotomy and 3 vessel CABG. The CT demonstrates apparent occlusions of 2 of the patient's coronary bypass grafts, indeterminate chronicity. The LIMA graft appears patent. Infrarenal abdominal aortic aneurysm, measuring 4.1 cm. Aortic aneurysm NOS (ICD10-I71.9). Aortic atherosclerosis and associated bilateral iliac disease without evidence of occlusion or high-grade stenosis. Aortic Atherosclerosis (ICD10-I70.0). Bilateral renal artery disease, including patent left renal artery stent. Mesenteric arterial disease, nonocclusive. Colonic diverticular disease without evidence of acute diverticulitis. Signed, Dulcy Fanny. Dellia Nims, RPVI [facility] Radiology Electronically Signed   By: Corrie Mckusick D.O.   On: 10/28/2017 10:56    Procedures Procedures (including critical care time)  Medications Ordered in ED Medications  iopamidol (ISOVUE-370) 76 % injection (has no administration in time range)  0.9 %  sodium chloride infusion (has no administration in time range)  0.9 %  sodium chloride infusion (has no administration in time range)  sodium chloride flush (NS) 0.9 % injection 3 mL (has no administration in time range)  sodium chloride flush (NS) 0.9 % injection 3 mL (has no administration in time range)  0.9 %  sodium chloride infusion (has no administration in time range)  clopidogrel (PLAVIX) tablet 75 mg (has no administration in time range)  ezetimibe (ZETIA) tablet 10 mg (has no administration in time range)  isosorbide mononitrate (IMDUR) 24 hr tablet 15 mg (has no administration in  time range)  lisinopril (PRINIVIL,ZESTRIL) tablet 5 mg (has no administration in time range)  mirtazapine (REMERON) tablet 7.5 mg (has no administration in time range)  nitroGLYCERIN (NITROSTAT) SL tablet 0.4 mg (has no administration in time range)  pantoprazole (PROTONIX) EC tablet 40 mg (has no administration in time range)  rosuvastatin (CRESTOR) tablet 40 mg (has no administration in time range)  acetaminophen (TYLENOL) tablet 650 mg (has no administration in time range)  ondansetron (ZOFRAN) injection 4 mg (has no administration in time range)  aspirin tablet 81 mg (has no administration in time range)  LORazepam (ATIVAN) tablet 1 mg (has no administration in time range)    Or  LORazepam (ATIVAN) injection 1 mg (has no administration in time range)  thiamine (VITAMIN B-1) tablet 100 mg (has no administration in time range)    Or  thiamine (B-1) injection 100 mg (has no administration in time range)  folic acid (FOLVITE) tablet 1 mg (has no administration in time range)  multivitamin with minerals tablet 1 tablet (has no administration in time range)  LORazepam (ATIVAN) tablet 0-4 mg (has no administration in time range)    Followed by  LORazepam (ATIVAN) tablet 0-4 mg (has no administration in time range)  iopamidol (ISOVUE-370) 76 % injection 100 mL (100 mLs Intravenous Contrast Given 10/28/17 1001)  nitroGLYCERIN (NITROGLYN) 2 % ointment 0.5 inch (0.5 inches Topical Given 10/28/17 1217)     Initial Impression / Assessment and Plan / ED Course  I have reviewed the triage vital signs and the nursing notes.  Pertinent labs & imaging results that were available during my care of the patient were reviewed by me and considered in my medical decision making (see chart for details).   Pt's cp has improved, but is still there a little bit.  Nitro paste applied.  Due to significant cardiac hx, pt d/w cards who will come see pt.  Cards did see pt and will take him to the cath lab.  Final  Clinical Impressions(s) / ED Diagnoses   Final diagnoses:  Unstable angina [facility])    ED Discharge Orders  None       Isla Pence, MD 10/28/17 1259

## 2017-10-28 NOTE — ED Notes (Signed)
Patient transported to X-ray 

## 2017-10-29 ENCOUNTER — Observation Stay (HOSPITAL_BASED_OUTPATIENT_CLINIC_OR_DEPARTMENT_OTHER): Payer: Medicare Other

## 2017-10-29 DIAGNOSIS — Z951 Presence of aortocoronary bypass graft: Secondary | ICD-10-CM | POA: Diagnosis not present

## 2017-10-29 DIAGNOSIS — I429 Cardiomyopathy, unspecified: Secondary | ICD-10-CM | POA: Diagnosis not present

## 2017-10-29 DIAGNOSIS — R0600 Dyspnea, unspecified: Secondary | ICD-10-CM

## 2017-10-29 DIAGNOSIS — I97618 Postprocedural hemorrhage and hematoma of a circulatory system organ or structure following other circulatory system procedure: Secondary | ICD-10-CM | POA: Diagnosis not present

## 2017-10-29 DIAGNOSIS — I25119 Atherosclerotic heart disease of native coronary artery with unspecified angina pectoris: Secondary | ICD-10-CM | POA: Diagnosis not present

## 2017-10-29 DIAGNOSIS — I25700 Atherosclerosis of coronary artery bypass graft(s), unspecified, with unstable angina pectoris: Secondary | ICD-10-CM | POA: Diagnosis not present

## 2017-10-29 DIAGNOSIS — Z955 Presence of coronary angioplasty implant and graft: Secondary | ICD-10-CM | POA: Diagnosis not present

## 2017-10-29 DIAGNOSIS — I200 Unstable angina: Secondary | ICD-10-CM

## 2017-10-29 DIAGNOSIS — I25110 Atherosclerotic heart disease of native coronary artery with unstable angina pectoris: Secondary | ICD-10-CM | POA: Diagnosis not present

## 2017-10-29 LAB — TROPONIN I: Troponin I: <0.03 ng/mL (ref <0.03)

## 2017-10-29 LAB — BASIC METABOLIC PANEL
ANION GAP: 7 (ref 5–15)
BUN: 6 mg/dL — ABNORMAL LOW (ref 8–23)
CALCIUM: 8.8 mg/dL — AB (ref 8.9–10.3)
CO2: 25 mmol/L (ref 22–32)
Chloride: 104 mmol/L (ref 98–111)
Creatinine, Ser: 0.75 mg/dL (ref 0.61–1.24)
GFR calc Af Amer: >60 mL/min (ref >60)
GLUCOSE: 95 mg/dL (ref 70–99)
Potassium: 4 mmol/L (ref 3.5–5.1)
Sodium: 136 mmol/L (ref 135–145)

## 2017-10-29 LAB — LIPID PANEL
Cholesterol: 118 mg/dL (ref 0–200)
HDL: 31 mg/dL — ABNORMAL LOW (ref >40)
LDL Cholesterol: 24 mg/dL (ref 0–99)
TRIGLYCERIDES: 316 mg/dL — AB (ref <150)
Total CHOL/HDL Ratio: 3.8 RATIO
VLDL: 63 mg/dL — ABNORMAL HIGH (ref 0–40)

## 2017-10-29 LAB — CBC
HCT: 33.8 % — ABNORMAL LOW (ref 39.0–52.0)
Hemoglobin: 11.3 g/dL — ABNORMAL LOW (ref 13.0–17.0)
MCH: 31.8 pg (ref 26.0–34.0)
MCHC: 33.4 g/dL (ref 30.0–36.0)
MCV: 95.2 fL (ref 78.0–100.0)
PLATELETS: 199 10*3/uL (ref 150–400)
RBC: 3.55 MIL/uL — ABNORMAL LOW (ref 4.22–5.81)
RDW: 14.6 % (ref 11.5–15.5)
WBC: 6.2 10*3/uL (ref 4.0–10.5)

## 2017-10-29 LAB — HIV ANTIBODY (ROUTINE TESTING W REFLEX): HIV Screen 4th Generation wRfx: NONREACTIVE

## 2017-10-29 LAB — ECHOCARDIOGRAM COMPLETE
Height: 65 in
Weight: 2613.77 oz

## 2017-10-29 LAB — D-DIMER, QUANTITATIVE: D-Dimer, Quant: 1.9 ug/mL-FEU — ABNORMAL HIGH (ref 0.00–0.50)

## 2017-10-29 NOTE — Progress Notes (Addendum)
  Text paged Dr. Maury Dus on call  In regards to patient family request to have cardiology update/discuss current treatment goal/on why pt did not go home today. Also on questions about CT scan and Ddimer result.Phone number for family member is [phone]  [facility]

## 2017-10-29 NOTE — Progress Notes (Signed)
   RN called to see if patient going home today. I spoke with Dr. Tamala Julian. Given his ongoing symptoms, we feel the patient should remain in the hospital tonight. His DDimer is elevated.  Recent CTA did not look at pulmonary arteries.  Will have to determine if he needs a CT with PE protocol.  Will wait on echo first. Patient is on low dose Imdur. He notes improved symptoms with NTG and morphine. May need to consider adding Ranolazine to his regimen. I explained to the patient he will need to remain in the hospital tonight.  He verbalized understanding.   Richardson Dopp, PA-C    10/29/2017 5:25 PM

## 2017-10-29 NOTE — Progress Notes (Signed)
Cardiology Moon[place] Note  Called patient's son Georgina Snell to update him on his father's care plan at the request of the patient's care nurse.  I updated the patient's son regarding the luminary findings from his father's echocardiogram and discussed the findings from his CTA that was performed on admission.  I informed him that ultimately the decision to pursue additional work-up for pulmonary embolism as well as any additional work-up for noncardiac causes of shortness of breath will be determined by Dr. Tamala Julian.  The patient's son was understanding of this and requested that Dr. Tamala Julian call him tomorrow at his convenience.  Marcie Mowers, MD Cardiology Fellow, PGY-6

## 2017-10-29 NOTE — Progress Notes (Signed)
*  PRELIMINARY RESULTS* Echocardiogram 2D Echocardiogram has been performed.  Leavy Cella 10/29/2017, 3:40 PM

## 2017-10-29 NOTE — Progress Notes (Addendum)
Progress Note  Patient Name: [name] Date of Encounter: 10/29/2017  Primary Cardiologist: Ida Rogue, MD   Subjective   Presented with chest discomfort and dyspnea suggestive of unstable angina.  Underwent left heart catheterization via left ulnar approach.  Had arm bleed post procedure with application of pressure bandage.  Now complaining of significant hand and arm pain.  Hand fingers and forearm below pressure bandage are swollen and uncomfortable.  Has good feeling in the hand.  Difficulty moving the fingers.  Has had some recurrence of tightness in the chest and dyspnea.  Nitroglycerin use last evening.  Inpatient Medications    Scheduled Meds: . aspirin EC  81 mg Oral Daily  . clopidogrel  75 mg Oral Daily  . ezetimibe  10 mg Oral Daily  . folic acid  1 mg Oral Daily  . isosorbide mononitrate  15 mg Oral QHS  . lisinopril  5 mg Oral Daily  . LORazepam  0-4 mg Oral Q6H   Followed by  . [START ON 10/30/2017] LORazepam  0-4 mg Oral Q12H  . multivitamin with minerals  1 tablet Oral Daily  . pantoprazole  40 mg Oral Q0600  . rosuvastatin  40 mg Oral Daily  . sodium chloride flush  3 mL Intravenous Q12H  . sodium chloride flush  3 mL Intravenous Q12H  . thiamine  100 mg Oral Daily   Or  . thiamine  100 mg Intravenous Daily   Continuous Infusions: . sodium chloride    . sodium chloride    . sodium chloride     PRN Meds: sodium chloride, sodium chloride, acetaminophen, LORazepam **OR** LORazepam, mirtazapine, morphine injection, nitroGLYCERIN, ondansetron (ZOFRAN) IV, sodium chloride flush, sodium chloride flush   Vital Signs    Vitals:   10/29/17 0436 10/29/17 0448 10/29/17 0454 10/29/17 0649  BP: 140/79 118/87 (!) 87/74 131/81  Pulse: (!) 59 62 69 (!) 56  Resp: 17 15 13 15   Temp:    97.7 F (36.5 C)  TempSrc:    Oral  SpO2: 99% 99% 94% 99%  Weight:    163 lb 5.8 oz (74.1 kg)  Height:        Intake/Output Summary (Last 24 hours) at 10/29/2017  0811 Last data filed at 10/29/2017 0600 Gross per 24 hour  Intake 243 ml  Output 550 ml  Net -307 ml   Filed Weights   10/28/17 0744 10/29/17 0649  Weight: 167 lb (75.8 kg) 163 lb 5.8 oz (74.1 kg)    Telemetry    NSR - Personally Reviewed  ECG    Performed 10/29/2017 at 4:52 AM demonstrating normal sinus rhythm with no acute ST-T wave change in identical to prior.- Personally Reviewed  Physical Exam  Left hand is swollen as is the lower part of the forearm.  Good capillary refill after compression. GEN: No acute distress.   Neck: No JVD Cardiac: RRR, no murmurs, rubs, or gallops.  Respiratory: Clear to auscultation bilaterally. GI: Soft, nontender, non-distended  MS: No edema; No deformity.  Edematous left hand, with good capillary refill.  Difficulty moving fingers due to significant swelling.  Fingers are pink.  Findings are most compatible with venous obstruction from compression bandage.  Doubt significant arterial flow issues. Neuro:  Nonfocal  Psych: Normal affect   Labs    Chemistry Recent Labs  Lab 10/28/17 0744 10/28/17 1616 10/29/17 0233  NA 134*  --  136  K 4.2  --  4.0  CL 103  --  104  CO2 25  --  25  GLUCOSE 110*  --  95  BUN 5*  --  6*  CREATININE 0.79  --  0.75  CALCIUM 9.0  --  8.8*  PROT  --  6.0*  --   ALBUMIN  --  3.0*  --   AST  --  30  --   ALT  --  19  --   ALKPHOS  --  39  --   BILITOT  --  0.8  --   GFRNONAA >60  --  >60  GFRAA >60  --  >60  ANIONGAP 6  --  7     Hematology Recent Labs  Lab 10/28/17 0744 10/29/17 0233  WBC 6.3 6.2  RBC 4.08* 3.55*  HGB 12.8* 11.3*  HCT 38.8* 33.8*  MCV 95.1 95.2  MCH 31.4 31.8  MCHC 33.0 33.4  RDW 14.5 14.6  PLT 238 199    Cardiac Enzymes Recent Labs  Lab 10/28/17 1616 10/28/17 1917 10/29/17 0233  TROPONINI <0.03 <0.03 <0.03    Recent Labs  Lab 10/28/17 0745 10/28/17 1128  TROPIPOC 0.00 0.00     BNP Recent Labs  Lab 10/28/17 0744  BNP 107.8*     DDimer No results for  input(s): DDIMER in the last 168 hours.   Radiology    Dg Chest 2 View  Result Date: 10/28/2017 CLINICAL DATA:  67 year old male with chest pain and shortness of breath onset today. Remote heart surgery in 1995. EXAM: CHEST - 2 VIEW COMPARISON:  Chest radiographs 04/04/2014 and earlier. FINDINGS: Lower lung volumes today. Mediastinal contours remain normal. Stable sternotomy wires. Visualized tracheal air column is within normal limits. No pneumothorax, pulmonary edema, pleural effusion or confluent pulmonary opacity. Stable visualized osseous structures. Negative visible bowel gas pattern. IMPRESSION: Lower lung volumes.  No acute cardiopulmonary abnormality. Electronically Signed   By: Genevie Ann M.D.   On: 10/28/2017 08:05   Ct Angio Chest/abd/pel For Dissection W And/or Wo Contrast  Result Date: 10/28/2017 CLINICAL DATA:  67 year old male with a history of chest pain and shortness of breath EXAM: CT ANGIOGRAPHY CHEST, ABDOMEN AND PELVIS TECHNIQUE: Multidetector CT imaging through the chest, abdomen and pelvis was performed using the standard protocol during bolus administration of intravenous contrast. Multiplanar reconstructed images and MIPs were obtained and reviewed to evaluate the vascular anatomy. CONTRAST:  151mL ISOVUE-370 IOPAMIDOL (ISOVUE-370) INJECTION 76% COMPARISON:  None. FINDINGS: CTA CHEST FINDINGS Cardiovascular: Heart: No cardiomegaly. No pericardial fluid/thickening. Native coronary calcifications. Surgical changes of median sternotomy and CABG. The lima graft appears patent. More superior bypass graft is occluded beyond the origin (image 53 of series 7). Indeterminate chronicity. More inferior bypass graft is occluded beyond the origin (image 72 of series 7). Indeterminate chronicity. Aorta: No intramural hematoma or periaortic fluid. Minimal calcifications of the aortic valve. No aneurysm of the thoracic aorta. No dissection flap. Atherosclerotic calcifications of the branch vessels  without significant stenosis. Cervical arteries at the lower neck are patent. Moderate atherosclerotic changes of the descending thoracic aorta. Diameter of the aorta at the hiatus measures 2.6 cm. Pulmonary arteries: No central, lobar, segmental, or proximal subsegmental filling defects. Mediastinum/Nodes: No mediastinal adenopathy. Unremarkable appearance of the thoracic esophagus. Unremarkable appearance of the thoracic inlet and thyroid. Lungs/Pleura: Central airways are clear. No pleural effusion. No confluent airspace disease. No pneumothorax. Musculoskeletal: No acute displaced fracture. Degenerative changes of the spine. Review of the MIP images confirms the above findings. CTA ABDOMEN AND PELVIS FINDINGS VASCULAR  Aorta: Moderate atherosclerotic changes of the abdominal aorta. Infrarenal abdominal aortic aneurysm measuring 4.1 cm at the level of the IMA. Circumferential thrombus. No periaortic fluid. No dissection of the abdominal aorta. Celiac: Atherosclerotic changes of the celiac artery. There is perhaps 50% narrowing at the origin. Branch vessels are patent. SMA: Atherosclerotic changes at the origin of the superior mesenteric artery with estimated 50% stenosis. Branches are patent. Renals: Atherosclerotic changes at the origin of the right renal artery, without high-grade stenosis identified. The main left renal artery demonstrates prior stenting, with patency of the stent maintained. IMA: IMA is patent. Right lower extremity: Atherosclerotic changes of the right iliac system. Hypogastric artery is patent. External iliac artery patent. Proximal femoral vasculature patent. Left lower extremity: Atherosclerotic changes of the left iliac system. Hypogastric artery is patent and narrowed at the origin. External iliac artery is patent. Common femoral artery patent. Proximal superficial femoral artery is patent. Veins: Unremarkable appearance of the venous system. Review of the MIP images confirms the above  findings. NON-VASCULAR Hepatobiliary: Unremarkable appearance of the liver. Calcified cholelithiasis. No associated inflammatory changes. Pancreas: Unremarkable pancreas Spleen: Unremarkable spleen Adrenals/Urinary Tract: Unremarkable adrenal glands Right: No hydronephrosis. Symmetric perfusion to the left. No nephrolithiasis. Unremarkable course of the right ureter. Left: No hydronephrosis. Symmetric perfusion to the right. No nephrolithiasis. Unremarkable course of the left ureter. Unremarkable appearance of the urinary bladder . Stomach/Bowel: Unremarkable appearance of the stomach. Unremarkable appearance of small bowel. No evidence of obstruction. Colonic diverticula. No associated inflammatory changes. Normal appendix. Lymphatic: No lymphadenopathy. Mesenteric: No free air.  No free fluid.  No mesenteric adenopathy. Reproductive: Unremarkable appearance of the pelvic organs. Other: Small fat containing umbilical hernia. Musculoskeletal: No acute fracture no evidence of acute aortic syndrome as etiology for chest pain. IMPRESSION: No evidence of acute aortic syndrome as an etiology for chest pain. Surgical changes of prior median sternotomy and 3 vessel CABG. The CT demonstrates apparent occlusions of 2 of the patient's coronary bypass grafts, indeterminate chronicity. The LIMA graft appears patent. Infrarenal abdominal aortic aneurysm, measuring 4.1 cm. Aortic aneurysm NOS (ICD10-I71.9). Aortic atherosclerosis and associated bilateral iliac disease without evidence of occlusion or high-grade stenosis. Aortic Atherosclerosis (ICD10-I70.0). Bilateral renal artery disease, including patent left renal artery stent. Mesenteric arterial disease, nonocclusive. Colonic diverticular disease without evidence of acute diverticulitis. Signed, Dulcy Fanny. Dellia Nims, RPVI [facility] Radiology Electronically Signed   By: Corrie Mckusick D.O.   On: 10/28/2017 10:56    Cardiac  Studies   Cardiac catheterization 10/28/2017: Coronary Diagrams   Diagnostic Diagram          Patient Profile     67 y.o. male long complicated vascular history with prior coronary bypass grafting, known totally occluded saphenous vein grafts, patent LIMA to LAD, left main and circumflex stent with 2 layers of stent in the circumflex/obtuse marginal.  Presented with chest discomfort and dyspnea.  Negative CT for dissection.  Cardiac cath revealed stable anatomy compared to 2015.  Postprocedure developed forearm bleed with application of compressive bandage in this morning complaining of significant swelling in the left hand and severe pain.  Assessment & Plan    1. Recurring chest pain and dyspnea of uncertain etiology.  Stable coronary anatomy by angiography performed yesterday.  No evidence of acute aortic syndrome.  Discomfort has been unresponsive to nitroglycerin.  Acute aortic syndrome has been excluded.  Will check d-dimer to be certain that symptoms are not related to PE. 2. Left forearm bleed/hematoma  secondary to left ulnar catheterization procedure.  Now with swollen left hand and lower forearm below compressive bandage likely due to venous obstruction.  Bandages been removed.  Observe closely.  Anticipate decreased swelling with removal of bandage and return of hand function.  May need vascular surgery evaluation if continued difficulty with hand in the presence of ulnar catheterization with absent radial artery. 3. Coronary artery disease with bypass graft occlusion and patent LIMA to LAD and heavily stented left main/circumflex/obtuse marginal still widely patent. 4. Stable kidney function after receiving iodinated contrast x2 yesterday.  Close observation of left hand and forearm.  We will check back in approximately 1 hour.  Vascular surgery evaluation if any concerns.  For questions or updates, please contact [place] Please consult [email or web address] for contact info under  Cardiology/STEMI.      Signed, Sinclair Grooms, MD  10/29/2017, 8:11 AM

## 2017-10-29 NOTE — Care Management Note (Signed)
Case Management Note  Patient Details  Name: [name] MRN: [id number] Date of Birth: Jul 27, 1950  Subjective/Objective:      Pt presented for [place].  Pt independent from home with wife.  Wife has DME but patient does not require.           Action/Plan: CM will follow for d/c needs.   Expected Discharge Date:                  Expected Discharge Plan:  Home/Self Care  In-House Referral:  NA  Discharge planning Services  CM Consult  Post Acute Care Choice:    Choice offered to:     DME Arranged:    DME Agency:     HH Arranged:    HH Agency:     Status of Service:  In process, will continue to follow  If discussed at Long Length of Stay Meetings, dates discussed:    Additional Comments:  Claudie Leach, RN 10/29/2017, 3:45 PM

## 2017-10-29 NOTE — Care Management Obs Status (Signed)
[place] NOTIFICATION   Patient Details  Name: [name] MRN: [id number] Date of Birth: 1950-07-30   Medicare Observation Status Notification Given:  Yes    Claudie Leach, RN 10/29/2017, 3:45 PM

## 2017-10-29 NOTE — Progress Notes (Signed)
Noted patient's left hand to be more swollen than it was at the beginning of the shift. The arm has been elevated throughout the night. He c/o 8/10 pain on his left arm radiating to his back. PRN Morphine 1 mg given was effective.  He also c/o 7/10 chest pain not relived by Nitro SL. SBP 87 after second dose of Nitro, pain 5/10.  EKG shows no changes. He states "its probably indigestion". On call Fellow Dr. Lennie Muckle made aware. Order received to remove TR band; which was removed without complications. MD came to assess the patient.  Will continue to monitor

## 2017-10-29 NOTE — Progress Notes (Addendum)
   Recurrent dyspnea and chest pain.  D-dimer elevated -->seems unlikely PE since aortic CTA r/o large PE.  Start DVT prophy  Await echo result.   Possibly home in AM  Ambulate.

## 2017-10-30 DIAGNOSIS — R0601 Orthopnea: Secondary | ICD-10-CM | POA: Diagnosis not present

## 2017-10-30 DIAGNOSIS — I25119 Atherosclerotic heart disease of native coronary artery with unspecified angina pectoris: Secondary | ICD-10-CM | POA: Diagnosis not present

## 2017-10-30 DIAGNOSIS — I25110 Atherosclerotic heart disease of native coronary artery with unstable angina pectoris: Secondary | ICD-10-CM | POA: Diagnosis not present

## 2017-10-30 DIAGNOSIS — I200 Unstable angina: Secondary | ICD-10-CM | POA: Diagnosis not present

## 2017-10-30 MED ORDER — PANTOPRAZOLE SODIUM 40 MG PO TBEC: 40.0000 mg | DELAYED_RELEASE_TABLET | Freq: Every day | ORAL | 0 refills | Status: DC

## 2017-10-30 NOTE — Progress Notes (Signed)
   Had dyspnea last night that improves with standing.  On admission was normal.  ECG has been stable.  Left hand swelling has completely resolved.  Ecchymosis is noted in forearm.  CTA of chest performed on admission did not reveal PE.  There was a definitive statement by radiology that no PE is present.  No acute aortic syndrome.  Coronary angiography is stable.  Ambulate vigorously in hall this morning.  No difficulty with ambulation, plan discharge later today.  Will need OP pulmonary consult and also 7-day transition of care cardiology follow-up.

## 2017-10-30 NOTE — Discharge Summary (Addendum)
The patient has been seen in conjunction with Christell Faith, PA-C. All aspects of care have been considered and discussed. The patient has been personally interviewed, examined, and all clinical data has been reviewed.   Please see note from earlier today.  Will need pulmonary consultation for dyspnea as an outpatient and TOC in 1 week.   Discharge Summary    Patient ID: [name]  MRN: [id number], DOB/AGE: 1951/02/01 67 y.o.  Admit Date: 10/28/2017 Discharge Date: 10/30/2017  Primary Care Provider: Venia Carbon, MD Primary Cardiologist: Dr. Rockey Situ, MD  Discharge Diagnoses    Active Problems:   Coronary artery disease involving native coronary artery of native heart with angina pectoris ([place])   Unstable angina (HCC)   Allergies Allergies  Allergen Reactions  . Metoclopramide     Other reaction(s): Unknown  . 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   Patient had CABG 1995 with LIMA->LAD, VG->RI (known to be occluded), VG->AM->PDA. In 2004 he had Inf STEMI. VG->AM->PDA was found to be occluded treated with 2 BMS complicated by acute thrombosis requiring 3 BMS. In 2005 he received DES to LAD & LCx (VG's to PDA & RI 100%). In 2012 he had Acute MI ([place]) and received DES to LCx & LM. Cath in 2015 resuled in DES x 2 to OM, also noting patent LIMA-LAD, (SVG-LCx and SVG-RCA were occluded by prior cath) EF 35% at that time, no follow-up echocardiogram since that time. Prior LVEF in 2012 was 50-55%. Last OV with Dr.  Rockey Situ in 03/2017. Note indicated some prior orthostatic intolerance with higher doses of Imdur.   Patient has been fairly active at home as detailed in his H&P. He woke up around 2 AM on 7/5 to use the bathroom. Upon going back to bed, he noted SOB upon recumbency. This persisted from 2am-6am and he had to sit up in a recliner for comfort. Around 6am he began to feel intermittently hot alternating with cold and also had sensation of bilateral arm pain with substernal chest discomfort, sharp at times. He has felt some relief if he presses in the middle of the chest. EMS was called and per triage notes gave 4 doses of SL NTG, 8mg  morphine, 324mg  ASA. This brought pain down from 6/10 to 3/10. EKG nonacute.   Hospital Course     Consultants: Care manager  Upon his arrival to [facility], NTG ointment ordered. Troponin was negative x 3. He was uncertain if this felt like his prior angina. Noninvasive imaging was deferred given his complex cardiac history. He underwent LHC on 10/28/2017 via left ulnar artery (radial artery was diminutive) that showed known severe multi-vessel CAD with known CTO of the ostial RCA, mid LAD & SVG-AM-PDA, SVG-OM. Widely patent LIMA-LAD, retrograde fills to 100% CTO, antegrade flow brisk to the apex. Proximal left main ~ 40% stenosed (stable lesion just prior to the stent) followed by widely patent stents running from pLM-ost-prox LCx-OM with brisk flow in the distal OM & AV Groove Cx-->collaterals to RPL system.  Mildly reduced LVEF with basal-mid inferior AK. Normal/low LVEDP. It was recommended to continue DAPT with ASA 81 mg daily and Plavix 75 mg daily long-term (beyond 12 months) because of extensive CAD and multiple overlapping stent int he LM-LCx-OM. Post case course was complicated by an arm bleed with application of a pressure bandage. This lead to complaint of significant hand/arm pain with swelling and normal sensation. With removal of the pressure bandage, these symptoms  resolved. He was noted to have ecchymosis of the left forearm. Post-cath he did have some recurrence of chest tightness and dyspnea. D-dimer was noted to be elevated at 1.90. CT angio chest/abdomen/pelvis without acute pathology, 4.1cm infrarenal AAA, bilateral iliac disease without high grade stenosis, mesenteric arterial disease nonocclusive, no evidence of PE per MD and radiology. Echo on 10/29/2017 showed an EF of 50%, mild LVH, AK of the basal inferior and basal inferolateral myocardium with severe HK of the mid inferior and mid inferolateral myocardium, trileaflet aortic valve without stenosis, Gr1DD, mildly reduced RVSF. His dyspnea improved with standing. EKG remained stable. Left hand swelling resolved. He ambulated in the hallway without difficulty. It was recommended the patient follow up with cardiology in [place] as well as be referred to pulmonology.   Recommend he discuss continuation of CBD oil with his primary cardiologist at follow up as CBD oil can lead to decreased efficacy of Plavix leading to decreased concentrations.   His Prilosec was changed to Protonix at time of discharge as this medication also decreased efficacy of his Plavix.   The patient's left ulnar cardiac cath site has been examined is healing well without issues at this time. The patient has been seen by Dr. Tamala Julian and felt to be stable for discharge today. All follow up appointments have been made. Discharge medications are listed below. Prescriptions have been reviewed with the patient and sent in to their pharmacy.  _____________  Discharge Vitals Blood pressure 134/79, pulse 81, temperature 98.2 F (36.8 C), temperature source Oral, resp. rate 15, height 5\' 5"  (1.651 m), weight 156 lb 9.6 oz (71 kg), SpO2 100 %.  Filed Weights   10/28/17 0744 10/29/17 0649 10/30/17 0538  Weight: 167 lb (75.8 kg) 163 lb 5.8 oz (74.1 kg) 156 lb 9.6 oz (71 kg)    Labs & Radiologic Studies    CBC Recent Labs    10/28/17 0744  10/29/17 0233  WBC 6.3 6.2  HGB 12.8* 11.3*  HCT 38.8* 33.8*  MCV 95.1 95.2  PLT 238 387   Basic Metabolic Panel Recent Labs    10/28/17 0744 10/29/17 0233  NA 134* 136  K 4.2 4.0  CL 103 104  CO2 25 25  GLUCOSE 110* 95  BUN 5* 6*  CREATININE 0.79 0.75  CALCIUM 9.0 8.8*   Liver Function Tests Recent Labs    10/28/17 1616  AST 30  ALT 19  ALKPHOS 39  BILITOT 0.8  PROT 6.0*  ALBUMIN 3.0*   No results for input(s): LIPASE, AMYLASE in the last 72 hours. Cardiac Enzymes Recent Labs    10/28/17 1616 10/28/17 1917 10/29/17 0233  TROPONINI <0.03 <0.03 <0.03   BNP Invalid input(s): POCBNP D-Dimer Recent Labs    10/29/17 0909  DDIMER 1.90*   Hemoglobin A1C No results for input(s): HGBA1C in the last 72 hours. Fasting Lipid Panel Recent Labs    10/29/17 0233  CHOL 118  HDL 31*  LDLCALC 24  TRIG 316*  CHOLHDL 3.8   Thyroid Function Tests Recent Labs  10/28/17 1616  TSH 2.086   _____________  Dg Chest 2 View  Result Date: 10/28/2017 CLINICAL DATA:  67 year old male with chest pain and shortness of breath onset today. Remote heart surgery in 1995. EXAM: CHEST - 2 VIEW COMPARISON:  Chest radiographs 04/04/2014 and earlier. FINDINGS: Lower lung volumes today. Mediastinal contours remain normal. Stable sternotomy wires. Visualized tracheal air column is within normal limits. No pneumothorax, pulmonary edema, pleural effusion or confluent pulmonary opacity. Stable visualized osseous structures. Negative visible bowel gas pattern. IMPRESSION: Lower lung volumes.  No acute cardiopulmonary abnormality. Electronically Signed   By: Genevie Ann M.D.   On: 10/28/2017 08:05   Ct Angio Chest/abd/pel For Dissection W And/or Wo Contrast  Result Date: 10/28/2017 CLINICAL DATA:  67 year old male with a history of chest pain and shortness of breath EXAM: CT ANGIOGRAPHY CHEST, ABDOMEN AND PELVIS TECHNIQUE: Multidetector CT imaging through the chest, abdomen and pelvis was  performed using the standard protocol during bolus administration of intravenous contrast. Multiplanar reconstructed images and MIPs were obtained and reviewed to evaluate the vascular anatomy. CONTRAST:  137mL ISOVUE-370 IOPAMIDOL (ISOVUE-370) INJECTION 76% COMPARISON:  None. FINDINGS: CTA CHEST FINDINGS Cardiovascular: Heart: No cardiomegaly. No pericardial fluid/thickening. Native coronary calcifications. Surgical changes of median sternotomy and CABG. The lima graft appears patent. More superior bypass graft is occluded beyond the origin (image 53 of series 7). Indeterminate chronicity. More inferior bypass graft is occluded beyond the origin (image 72 of series 7). Indeterminate chronicity. Aorta: No intramural hematoma or periaortic fluid. Minimal calcifications of the aortic valve. No aneurysm of the thoracic aorta. No dissection flap. Atherosclerotic calcifications of the branch vessels without significant stenosis. Cervical arteries at the lower neck are patent. Moderate atherosclerotic changes of the descending thoracic aorta. Diameter of the aorta at the hiatus measures 2.6 cm. Pulmonary arteries: No central, lobar, segmental, or proximal subsegmental filling defects. Mediastinum/Nodes: No mediastinal adenopathy. Unremarkable appearance of the thoracic esophagus. Unremarkable appearance of the thoracic inlet and thyroid. Lungs/Pleura: Central airways are clear. No pleural effusion. No confluent airspace disease. No pneumothorax. Musculoskeletal: No acute displaced fracture. Degenerative changes of the spine. Review of the MIP images confirms the above findings. CTA ABDOMEN AND PELVIS FINDINGS VASCULAR Aorta: Moderate atherosclerotic changes of the abdominal aorta. Infrarenal abdominal aortic aneurysm measuring 4.1 cm at the level of the IMA. Circumferential thrombus. No periaortic fluid. No dissection of the abdominal aorta. Celiac: Atherosclerotic changes of the celiac artery. There is perhaps 50%  narrowing at the origin. Branch vessels are patent. SMA: Atherosclerotic changes at the origin of the superior mesenteric artery with estimated 50% stenosis. Branches are patent. Renals: Atherosclerotic changes at the origin of the right renal artery, without high-grade stenosis identified. The main left renal artery demonstrates prior stenting, with patency of the stent maintained. IMA: IMA is patent. Right lower extremity: Atherosclerotic changes of the right iliac system. Hypogastric artery is patent. External iliac artery patent. Proximal femoral vasculature patent. Left lower extremity: Atherosclerotic changes of the left iliac system. Hypogastric artery is patent and narrowed at the origin. External iliac artery is patent. Common femoral artery patent. Proximal superficial femoral artery is patent. Veins: Unremarkable appearance of the venous system. Review of the MIP images confirms the above findings. NON-VASCULAR Hepatobiliary: Unremarkable appearance of the liver. Calcified cholelithiasis. No associated inflammatory changes. Pancreas: Unremarkable pancreas Spleen: Unremarkable spleen Adrenals/Urinary Tract: Unremarkable adrenal glands Right: No hydronephrosis. Symmetric perfusion to the left. No nephrolithiasis. Unremarkable course of the right ureter. Left: No hydronephrosis. Symmetric perfusion to the  right. No nephrolithiasis. Unremarkable course of the left ureter. Unremarkable appearance of the urinary bladder . Stomach/Bowel: Unremarkable appearance of the stomach. Unremarkable appearance of small bowel. No evidence of obstruction. Colonic diverticula. No associated inflammatory changes. Normal appendix. Lymphatic: No lymphadenopathy. Mesenteric: No free air.  No free fluid.  No mesenteric adenopathy. Reproductive: Unremarkable appearance of the pelvic organs. Other: Small fat containing umbilical hernia. Musculoskeletal: No acute fracture no evidence of acute aortic syndrome as etiology for chest  pain. IMPRESSION: No evidence of acute aortic syndrome as an etiology for chest pain. Surgical changes of prior median sternotomy and 3 vessel CABG. The CT demonstrates apparent occlusions of 2 of the patient's coronary bypass grafts, indeterminate chronicity. The LIMA graft appears patent. Infrarenal abdominal aortic aneurysm, measuring 4.1 cm. Aortic aneurysm NOS (ICD10-I71.9). Aortic atherosclerosis and associated bilateral iliac disease without evidence of occlusion or high-grade stenosis. Aortic Atherosclerosis (ICD10-I70.0). Bilateral renal artery disease, including patent left renal artery stent. Mesenteric arterial disease, nonocclusive. Colonic diverticular disease without evidence of acute diverticulitis. Signed, Dulcy Fanny. Dellia Nims, RPVI [facility] Electronically Signed   By: Corrie Mckusick D.O.   On: 10/28/2017 10:56    Diagnostic Studies/Procedures   LHC 10/28/2017: Coronary Findings   Diagnostic  Dominance: Right  Left Main  Vessel is large.  Ost LM lesion 45% stenosed  Ost LM lesion is 45% stenosed. The lesion is eccentric. Hinge point prior to stent  Mid LM to Dist LM lesion 0% stenosed  Previously placed Mid LM to Dist LM stent (unknown type) is widely patent.  Left Anterior Descending  Prox LAD lesion 100% stenosed  Prox LAD lesion is 100% stenosed. The lesion is chronically occluded.  First Diagonal Branch  Vessel is small in size.  First Septal Branch  Vessel is moderate in size. Large septal perforator trunk that branches into 2 major branches coursing through the septum to the apex.  Third Diagonal Branch  Vessel is small in size.  Left Circumflex  Vessel is normal in caliber.  Ost Cx to Mid Cx lesion 0% stenosed  Previously placed Ost Cx to Mid Cx stent (unknown type) is widely patent. Reportedly bare-metal stents with overlapping DES stents distally.  Second Obtuse Marginal Branch  Vessel is small in size.    Left Atrioventricular Groove Continuation  Vessel is moderate in size.  Right Coronary Artery  Vessel is moderate in size. There is moderate diffuse disease throughout the vessel.  Ost RCA to Dist RCA lesion 100% stenosed  Ost RCA to Dist RCA lesion is 100% stenosed. The lesion is chronically occluded.  Right Posterior Descending Artery  Collaterals  RPDA filled by collaterals from 2nd Sept.    Right Posterior Atrioventricular Branch  Vessel is small in size.  Collaterals  Post Atrio filled by collaterals from LAV Groove.    Sequential jump graft Graft to Acute Mrg, RPDA  Seq SVG- AVM-PDA graft was not visualized due to known occlusion.  Origin to Insertion lesion before Acute Mrg 100% stenosed  Origin to Insertion lesion before Acute Mrg is 100% stenosed. The lesion is chronically occluded. The lesion was previously treated.  LIMA LIMA Graft to Mid LAD  LIMA graft was visualized by angiography and is normal in caliber. The graft exhibits no disease.  saphenous Graft to Mid Cx  SVG graft was not visualized due to known occlusion.  Origin lesion 100% stenosed  Origin lesion is 100% stenosed. The lesion is chronically occluded.  Intervention  No interventions have been documented.  Wall Motion              Left Heart   Left Ventricle The left ventricular size is in the upper limits of normal. There is mild to moderate left ventricular systolic dysfunction. LV end diastolic pressure is normal. The left ventricular ejection fraction is 45-50% by visual estimate. There are LV function abnormalities due to segmental dysfunction.  Mitral Valve There is no mitral valve regurgitation.  Aortic Valve There is no aortic valve stenosis. The aortic valve is calcified. There is normal aortic valve motion.  Coronary Diagrams   Diagnostic Diagram        Conclusion     There is mild to moderate left ventricular systolic dysfunction. The LVEF was 45-50% by visual estimate basal  inferior -aneurysmal akinesis.  LV end diastolic pressure is normal.  _________________________________________________________________________________________  Colon Flattery LM lesion is 45% stenosed - just prior to stent.  Previously placed Mid LM to Dist LM stent (unknown type) is widely patent - the stent continues into the pCx.  Previously placed Ost Cx to Mid Cx stents (unknown type -several overlapping BMS and DES) are widely patent.  Prox LAD lesion is 100% stenosed = just after large branching SP 1  LIMA-LAD graft was visualized by angiography and is normal in caliber. The graft exhibits no disease.  Ost RCA to Dist RCA lesion is 100% stenosed  Seq SVG- AVM-PDA graft was not visualized due to known occlusion.  SVG-OM graft was not visualized due to known occlusion.   RELATIVELY STABLE CAD FROM 2015  Known Severe Multi-vessel CAD: known CTO of ostRCA, mLAD & SVG-AM-PDA, SVG-OM.  Widely patent LIMA-LAD - retograde fills to 100% CTO, antegrade flow brisk to the Apex.  Prox LM ~40% (stable lesion just prior to the stent) followed by widely patent stents running from pLM-ost-proxCx-OM with brisk flow in both the distal OM & AV Groove Cx --> collaterals to RPL system.  Mldly reduced LVEF with basal-mid Inferior Akinesis.  Normal/Low LVEDP.   PLAN: Return to nursing unit for ongoing care and TR band removal.    Post-cath hydration.    Evaluate for noncardiac cause for chest pain.  Recommend dual antiplatelet therapy with Aspirin 81mg  daily and Clopidogrel 75mg  daily long-term (beyond 12 months) because of extensive CAD & multiple overlapping stents in LM-Cx-OM.      Echo 10/29/2017: Study Conclusions  - Left ventricle: The cavity size was normal. Wall thickness was   increased in a pattern of mild LVH. Systolic function was at the   lower limits of normal. The estimated ejection fraction was 50%.   Doppler parameters are consistent with abnormal left ventricular    relaxation (grade 1 diastolic dysfunction). - Regional wall motion abnormality: Akinesis of the basal inferior   and basal inferolateral myocardium; severe hypokinesis of the mid   inferior and mid inferolateral myocardium. - Aortic valve: Trileaflet; mildly calcified leaflets. There was no   stenosis. - Right ventricle: Systolic function was mildly reduced. _____________  Disposition   Pt is being discharged home today in good condition.  Follow-up Plans & Appointments    Follow-up Information    Gollan, Kathlene November, MD Follow up.   Specialty:  Cardiology Why:  A message has been sent to our office for hospital follow up for the patient to be seen within 7 days.  Contact information: [place] [address] [phone]  Discharge Instructions    Diet - low sodium heart healthy   Complete by:  As directed    Increase activity slowly   Complete by:  As directed       Discharge Medications   Allergies as of 10/30/2017      Reactions   Metoclopramide    Other reaction(s): Unknown   Metoprolol Tartrate Hives, Itching      Medication List    STOP taking these medications   omeprazole 20 MG tablet Commonly known as:  PRILOSEC OTC Replaced by:  pantoprazole 40 MG tablet     TAKE these medications   ALEVE 220 MG tablet Generic drug:  naproxen sodium Take 220 mg by mouth daily as needed (pain).   aspirin 81 MG tablet Take 81 mg by mouth daily.   carvedilol 3.125 MG tablet Commonly known as:  COREG Take 1 tablet (3.125 mg total) by mouth 2 (two) times daily.   clopidogrel 75 MG tablet Commonly known as:  PLAVIX Take 1 tablet (75 mg total) by mouth daily.   ezetimibe 10 MG tablet Commonly known as:  ZETIA TAKE 1 TABLET BY MOUTH DAILY   isosorbide mononitrate 30 MG 24 hr tablet Commonly known as:  IMDUR TAKE ONE-HALF TABLET BY MOUTH AT BEDTIME   lisinopril 5 MG tablet Commonly known as:  PRINIVIL,ZESTRIL TAKE 1 TABLET  BY MOUTH EVERY DAY   mirtazapine 15 MG tablet Commonly known as:  REMERON TAKE 1 TABLET BY MOUTH EVERY NIGHT AT BEDTIME AS NEEDED FOR SLEEP. What changed:    how much to take  how to take this  when to take this  additional instructions   nitroGLYCERIN 0.4 MG SL tablet Commonly known as:  NITROSTAT Place 1 tablet (0.4 mg total) under the tongue every 5 (five) minutes as needed for chest pain.   OVER THE COUNTER MEDICATION Take 1 capsule by mouth daily. CBD   pantoprazole 40 MG tablet Commonly known as:  PROTONIX Take 1 tablet (40 mg total) by mouth daily at 6 (six) AM. Start taking on:  10/31/2017 Replaces:  omeprazole 20 MG tablet   rosuvastatin 40 MG tablet Commonly known as:  CRESTOR TAKE 1 TABLET BY MOUTH DAILY         Aspirin prescribed at discharge?  Yes High Intensity Statin Prescribed? (Lipitor 40-80mg  or Crestor 20-40mg ): Yes Beta Blocker Prescribed? Yes For EF <40%, was ACEI/ARB Prescribed? Yes, EF > 40% ADP Receptor Inhibitor Prescribed? (i.e. Plavix etc.-Includes Medically Managed Patients): Yes For EF <40%, Aldosterone Inhibitor Prescribed? No: EF > 40% Was EF assessed during THIS hospitalization? Yes Was Cardiac Rehab II ordered? (Included Medically managed Patients): Yes   Outstanding Labs/Studies   None  Duration of Discharge Encounter   Greater than 30 minutes including physician time.  Signed, Rise Mu, PA-C [place] Pager: [phone] 10/30/2017, 10:13 AM

## 2017-10-30 NOTE — Discharge Instructions (Signed)
Please review your medication list carefully as there may be new medications or changes to some of your medications that you were previously taking.     Angiogram, Care After This sheet gives you information about how to care for yourself after your procedure. Your doctor may also give you more specific instructions. If you have problems or questions, contact your doctor. Follow these instructions at home: Insertion site care  Follow instructions from your doctor about how to take care of your long, thin tube (catheter) insertion area. Make sure you: ? Wash your hands with soap and water before you change your bandage (dressing). If you cannot use soap and water, use hand sanitizer. ? Change your bandage as told by your doctor. ? Leave stitches (sutures), skin glue, or skin tape (adhesive) strips in place. They may need to stay in place for 2 weeks or longer. If tape strips get loose and curl up, you may trim the loose edges. Do not remove tape strips completely unless your doctor says it is okay.  Do not take baths, swim, or use a hot tub until your doctor says it is okay.  You may shower 24-48 hours after the procedure or as told by your doctor. ? Gently wash the area with plain soap and water. ? Pat the area dry with a clean towel. ? Do not rub the area. This may cause bleeding.  Do not apply powder or lotion to the area. Keep the area clean and dry.  Check your insertion area every day for signs of infection. Check for: ? More redness, swelling, or pain. ? Fluid or blood. ? Warmth. ? Pus or a bad smell. Activity  Rest as told by your doctor, usually for 1-2 days.  Do not lift anything that is heavier than 10 lbs. (4.5 kg) or as told by your doctor.  Do not drive for 24 hours if you were given a medicine to help you relax (sedative).  Do not drive or use heavy machinery while taking prescription pain medicine. General instructions  Go back to your normal activities as  told by your doctor, usually in about a week. Ask your doctor what activities are safe for you.  If the insertion area starts to bleed, lie flat and put pressure on the area. If the bleeding does not stop, get help right away. This is an emergency.  Drink enough fluid to keep your pee (urine) clear or pale yellow.  Take over-the-counter and prescription medicines only as told by your doctor.  Keep all follow-up visits as told by your doctor. This is important. Contact a doctor if:  You have a fever.  You have chills.  You have more redness, swelling, or pain around your insertion area.  You have fluid or blood coming from your insertion area.  The insertion area feels warm to the touch.  You have pus or a bad smell coming from your insertion area.  You have more bruising around the insertion area.  Blood collects in the tissue around the insertion area (hematoma) that may be painful to the touch. Get help right away if:  You have a lot of pain in the insertion area.  The insertion area swells very fast.  The insertion area is bleeding, and the bleeding does not stop after holding steady pressure on the area.  The area near or just beyond the insertion area becomes pale, cool, tingly, or numb. These symptoms may be an emergency. Do not wait to  see if the symptoms will go away. Get medical help right away. Call your local emergency services (911 in the [place] Do not drive yourself to the hospital. Summary  After the procedure, it is common to have bruising and tenderness at the long, thin tube insertion area.  After the procedure, it is important to rest and drink plenty of fluids.  Do not take baths, swim, or use a hot tub until your doctor says it is okay to do so. You may shower 24-48 hours after the procedure or as told by your doctor.  If the insertion area starts to bleed, lie flat and put pressure on the area. If the bleeding does not stop, get help right away. This  is an emergency. This information is not intended to replace advice given to you by your health care provider. Make sure you discuss any questions you have with your health care provider. Document Released: 07/09/2008 Document Revised: 04/06/2016 Document Reviewed: 04/06/2016 Elsevier Interactive Patient Education  2017 [employer].    Exercise Guidelines During Cardiac Rehabilitation When you are recovering from a heart condition, such as from heart surgery, heart attack, or heart failure, it is important to have heart-healthy habits, including exercise routines. Discuss an appropriate exercise program with your heart specialist (cardiologist) and rehabilitation therapist. It is important to design a program that is safe and effective for you. The program should meet your specific abilities and needs. Walking, biking, jogging, and swimming are all good aerobic activities. These take light to moderate effort. Adding some light resistance training is also important. Even simple lifestyle changes can help. These lifestyle changes may include parking farther from the store or taking the stairs instead of the elevator. At first, you may begin exercising under supervision, such as at a hospital or clinic. Over time, you may begin exercising at home, with your health care provider's approval. Types of exercise Aerobic exercise During cardiac rehabilitation, it is important to do aerobic activities. Aerobic exercise keeps joints and muscles moving. It involves large muscle groups. It is also rhythmic and must be done for a longer period of time. Doing these exercises improves circulation and endurance. Examples of aerobic exercise include:  Swimming.  Walking.  Hiking.  Jogging.  Cross-country skiing.  Biking.  Dancing.  Static exercise Static exercise (isometric exercise) uses muscles at high intensities without moving the joints. Some examples of static exercise include pushing against a  heavy couch that does not move, doing a wall sit, or holding a plank position. Static exercise improves strength but also quickly increases blood pressure. Follow these guidelines:  If you have circulation problems or high blood pressure, talk with your health care provider before starting any static exercise routines. Do not do static exercises if your health care provider tells you not to.  Do not hold your breath while doing static exercises. Holding your breath during static exercises can raise your blood pressure to a dangerously high level.  Weight-resistance exercise Weight-resistance exercises are another important part of rehabilitation. These exercises strengthen your muscles by making them work against resistance. Resistance exercises may help you return to activities of daily living sooner and improve your quality of life. They also help reduce cardiac risk factors. Examples of weight-resistance exercise include using:  Free weights.  Weight-lifting machines.  Large, specially designed rubber bands.  You will usually do weight-resistance exercises 2 times a week with a 2-day rest period between workouts. Stretching Stretching before you exercise warms up your muscles and  prevents injury. Stretching also improves your flexibility, balance, coordination, and range of motion. Follow these guidelines:  Stretch both before and after exercising.  Do not force a muscle or joint into a painful angle. Stretching should be a relaxing part of your exercise routine.  Once you feel resistance in your muscle, hold the stretch for a few seconds. Make sure you keep breathing while you hold the stretch.  Go slowly when doing all stretches.  Setting a pace  Choose a pace that is comfortable for you. ? You should be able to talk while exercising. If you are short of breath or unable to speak while you exercise, slow down. ? If you are able to sing while exercising, you are not exercising hard  enough.  Keep track of how hard you are working as you exercise (exertion level). Your rehabilitation therapist can teach you to use a mental scale to measure your level of exertion (perceived exertion). Using a mental scale, you will think about your exertion level and rate it in a range from 6 to 20. ? A rating of 6 to 9. This means that you are doing "very light" exercise and are not exerting yourself enough. For a healthy person, this may be walking at a slow pace. ? A rating of 11 to 15. This is exercise that is "somewhat hard." For a healthy exercise session, you should aim for an exertion rate that is within this range. ? A rating of 16 to 17. This is considered "very hard" or strenuous. For a healthy person, exercise at this rating may start to feel heavy and difficult. ? A rating of 19 to 20. This means that you are working "extremely hard." For most people, these numbers represent the hardest you've ever worked to exercise.  Your health care provider or cardiac rehabilitation specialist may also recommend that you wear a heart rate monitor while you exercise. This will help you keep track of your heart rate zones and how hard your heart is working. Frequency As you are recovering, it is important to start exercising slowly and to gradually work up to your goal. Work with your health care provider to set up an exercise routine that works for you. Generally, cardiac rehabilitation exercise should include:  40 minutes of aerobic activity 3 - 4 days a week.  Stretching and strength exercises 2 - 3 days a week.  Contact a doctor if:  You have any of the following symptoms while exercising: ? Pain, pressure, or burning in your chest, jaw, shoulder, or back (angina). ? Lightheadedness. ? Dizziness. ? Irregular or fast heartbeat. ? Shortness of breath.  You are extremely tired after exercising. Get help right away if:  You have angina that does not get better with medicine and lasts for  more than 5 minutes.  You have nausea or you vomit.  You have excessive sweating that is not caused by exercise. Summary  When you are recovering from a heart condition, it is important to have heart-healthy habits, including exercise routines.  At first, you may begin exercising under supervision, such as at a hospital or clinic. Over time, you may begin exercising at home, with your health care provider's approval.  Aim for 40 minutes of aerobic exercises 3 - 4 days a week.  Aim to do stretching and strength exercises 2 - 3 days a week.  Choose a pace that is comfortable for you. You should be able to talk while exercising. This information is not  intended to replace advice given to you by your health care provider. Make sure you discuss any questions you have with your health care provider. Document Released: 04/17/2013 Document Revised: 03/12/2016 Document Reviewed: 03/12/2016 Elsevier Interactive Patient Education  [employer].

## 2017-10-31 ENCOUNTER — Ambulatory Visit (INDEPENDENT_AMBULATORY_CARE_PROVIDER_SITE_OTHER): Payer: Medicare Other | Admitting: Internal Medicine

## 2017-10-31 ENCOUNTER — Encounter (HOSPITAL_COMMUNITY): Payer: Self-pay | Admitting: Cardiology

## 2017-10-31 VITALS — BP 122/72 | HR 79 | Ht 65.0 in | Wt 160.2 lb

## 2017-10-31 DIAGNOSIS — R0602 Shortness of breath: Secondary | ICD-10-CM | POA: Diagnosis not present

## 2017-10-31 DIAGNOSIS — I200 Unstable angina: Secondary | ICD-10-CM

## 2017-10-31 MED ORDER — POTASSIUM CHLORIDE ER 10 MEQ PO TBCR: 10.0000 meq | EXTENDED_RELEASE_TABLET | Freq: Every day | ORAL | 1 refills | Status: DC

## 2017-10-31 MED ORDER — FUROSEMIDE 20 MG PO TABS: 20.0000 mg | ORAL_TABLET | Freq: Every day | ORAL | 1 refills | Status: DC

## 2017-10-31 MED FILL — Heparin Sod (Porcine)-NaCl IV Soln 1000 Unit/500ML-0.9%: INTRAVENOUS | Qty: 1000 | Status: AC

## 2017-10-31 NOTE — Patient Instructions (Addendum)
Will send for sleep study.  Change omeprazole to protonix as previously prescribed.  Start lasix daily around noon.  Will send for sleep study.

## 2017-10-31 NOTE — Progress Notes (Signed)
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  - Patient is fairly active with minimal dyspnea on heart exertion.  No symptoms at rest or with mild activity. - Spirometry today suggestive of mild COPD, given his lack of symptoms suggest continued physical activity, no need for empiric treatment at this time can consider any future if nighttime dyspnea symptoms are not resolving.  Nocturnal dyspnea/orthopnea.  -Patient has dyspnea when sleeping at night, appears to resolve when standing up.  Differential includes PND, obstructive sleep apnea, GERD. - We will start Lasix 20 mg daily to be taken between noon and 2 PM to see if this helps with nocturnal dyspnea symptoms.  Patient is also given a potassium supplement.  GERD.  -Patient has a history of GERD, symptoms appear controlled however he could be having silent aspiration. - His omeprazole was switched to Protonix, recommend he start this as prescribed.  Excessive daytime sleepiness.  -Symptoms and signs of obstructive sleep apnea. -We will send for sleep study to rule out sleep apnea as a cause for his nocturnal symptoms.   Orders Placed This Encounter  Procedures  . Spirometry with Graph  . Split night study   Meds ordered this encounter  Medications  . furosemide (LASIX) 20 MG tablet    Sig: Take 1 tablet (20 mg total) by mouth daily.    Dispense:  30 tablet    Refill:  1  . potassium chloride (K-DUR) 10 MEQ tablet    Sig: Take 1 tablet (10 mEq total) by mouth daily. Use while you are on water pill.    Dispense:  30 tablet    Refill:  1   Return in about 1 month (around 11/28/2017).   Date: 10/31/2017  MRN# [id number] [name] [birth year]  Referring Physician: Christell Faith PA.  [name] is a 67 y.o. old male seen in consultation for chief complaint of:    Chief Complaint  Patient presents with  . Consult    Self Referral; SOB; worse with activity. Was  admitted to [facility] Friday to yesterday. Pt notes he has heart issues as well.    HPI:  The patient is a 67 year old male with history of CAD s/p CABG 1995 and multiple PCIs as outlined below, cardimyopathy(EF 35% in 2015), alcohol abuse, AAA (4.1cm by imaging this admission), HTN, HLD, COPD, chronic appearing mild hyponatremia, renal artery stenosis s/p prior L renal artery stenting (last duplex 2016 with stable 1-59% bilateral RAS, PAD.  The patient was admitted to the hospital from 7/5 to 10/30/2017, admitted with dyspnea chest pain, underwent left heart cath on 7/5 with severe multivessel disease, with no evidence of acute occlusion. He has noticed that his breathing has progressively declined. He is fairly active, trims weeds, rebuilding his deck he "can manage" but has to stop more often than usual because of fatigue. He presented to the hospital because recently overnight he could not get his breath when laying down. When he stood up it got better, then came back after laying down for a few minutes.  The problem did not recur since that time.  He has not been tested for OSA, he does snore at night, he is sleepy during the day but does not nap. He urinates about twice nightly.  He does not eat late at night.  No pets at home.   He has reflux, he takes prilosec once daily which controls symptoms. He was recently  changed to protonix.    **Spirometry 10/31/2017>> tracings personally reviewed FVC is 88% predicted, FEV1 is 84% predicted, ratio 70%.  Overall this test is consistent with mild obstructive lung disease. **CT chest and chest x-ray 10/28/2017 >>imaging personally reviewed, lungs are normal.  There is minimal dependent edema, and minimal atelectasis.  RV enlargement suggestive of pulmonary hypertension. Lung cuts CT A/P from 10/29/2017, shows some minimal bibasilar atelectasis. **Echocardiogram 10/29/17>> EF 50% akinesis of inferior basal myocardium.  No significant tricuspid  regurgitation. **Cardiac catheterization 10/28/2017>> EF 45 to 50%.  LVEDP 6 mm; stable CAD.     PMHX:   Past Medical History:  Diagnosis Date  . AAA (abdominal aortic aneurysm) ([place])    a. Duplex 01/2012: stable infrarenal saccular AAA at 3.3cm x 3.2xm (f/u recommended 01/2013 per Dr. Rockey Klein)  . Alcohol abuse   . CAD (coronary artery disease)    a. 1995 s/p CABG x 4: LIMA->LAD, VG->RI (known to be occluded), VG->AM->PDA;  b. 05/2002 Inf STEMI: VG->AM->PDA 100% treated w/ 2 BMS complicated by acute thrombosis req 3 BMS;  c. 07/2003 DES to  LAD & LCX (VG's to PDA & RI 100%);  d. 12/2010 Acute MI ([place]): DES to LCX & LM , LIMA ok,;  e.12/2011 Cath: LM/LCX stents ok , LIMA patent.  . Cardiomyopathy ([place])    a. EF 35% by cath 2015.  . Diverticulitis    1/06 Diverticulitis--CT of pelvis--diffuse sigmoid divertic  . Dyspnea   . GERD (gastroesophageal reflux disease)   . Hyperlipidemia   . Hypertension   . Myocardial infarction ([place])   . PAD (peripheral artery disease) ([facility])    a. external iliac and mesenteric stenosis noted by noninvasive imaging.  . Renal artery stenosis ([place])    a. 03/2011 PTA and stenting of L RA. b. last duplex 2016 with stable 1-59% bilateral RAS, incidental >50% R EIA stenosis   Surgical Hx:  Past Surgical History:  Procedure Laterality Date  . ANGIOPLASTY  1997  . CARDIAC CATHETERIZATION     8/09  Cath--vein graft occlusions which are old--no acute changes  . CARDIAC CATHETERIZATION  11/13/2010   stent x 2 @ [place]  . CARDIAC CATHETERIZATION  04/05/2014   stent placement   . CARDIAC CATHETERIZATION  10/28/2017  . CLOSED REDUCTION SHOULDER DISLOCATION    . CORONARY ANGIOPLASTY  04/05/2014   stent placement OM 1  . CORONARY ARTERY BYPASS GRAFT    . CORONARY STENT PLACEMENT  7/12   2 stents--Promus element plus (everolimus eluting)--[facility] in [place]  . LEFT HEART CATH AND CORS/GRAFTS ANGIOGRAPHY N/A 10/28/2017   Procedure: LEFT HEART CATH AND  CORS/GRAFTS ANGIOGRAPHY;  Surgeon: Leonie Man, MD;  Location: [facility];  Service: Cardiovascular;  Laterality: N/A;  . LEFT HEART CATHETERIZATION WITH CORONARY/GRAFT ANGIOGRAM N/A 01/04/2012   Procedure: LEFT HEART CATHETERIZATION WITH Beatrix Fetters;  Surgeon: Burnell Blanks, MD;  Location[facility] C[facility] LAB;  Service: Cardiovascular;  Laterality: N/A;  . LEFT HEART CATHETERIZATION WITH CORONARY/GRAFT ANGIOGRAM N/A 04/05/2014   Procedure: LEFT HEART CATHETERIZATION WITH Beatrix Fetters;  Surgeon: Jettie Booze, MD;  Location: [facility] CATH LAB;  Service: Cardiovascular;  Laterality: N/A;  . PERCUTANEOUS CORONARY STENT INTERVENTION (PCI-S)  04/05/2014   Procedure: PERCUTANEOUS CORONARY STENT INTERVENTION (PCI-S);  Surgeon: Jettie Booze, MD;  Location: [facility] CATH LAB;  Service: Cardiovascular;;  OM1  . RENAL ANGIOGRAM N/A 04/16/2011   Procedure: RENAL ANGIOGRAM;  Surgeon: Burnell Blanks, MD;  Location: [facility] CATH LAB;  Service: Cardiovascular;  Laterality: N/A;  . RENAL ARTERY STENT  03/2011 ?   Family Hx:  Family History  Problem Relation Age of Onset  . Coronary artery disease Mother        Died MI age 91  . Hypertension Mother   . Heart attack Mother   . Coronary artery disease Sister        Living  . Coronary artery disease Brother        Living  . Coronary artery disease Father        Died MI age 19  . Heart attack Father   . Heart attack Maternal Grandfather   . Diabetes Neg Hx   . Cancer Neg Hx        prostate or colon   Social Hx:   Social History   Tobacco Use  . Smoking status: Former Smoker    Packs/day: 3.00    Years: 25.00    Pack years: 75.00    Types: Cigarettes    Last attempt to quit: 05/28/1993    Years since quitting: 24.4  . Smokeless tobacco: Never Used  Substance Use Topics  . Alcohol use: Yes    Alcohol/week: 25.2 oz    Types: 42 Cans of beer per week  . Drug use: No   Medication:    Current Outpatient  Medications:  .  aspirin 81 MG tablet, Take 81 mg by mouth daily., Disp: , Rfl:  .  carvedilol (COREG) 3.125 MG tablet, Take 1 tablet (3.125 mg total) by mouth 2 (two) times daily., Disp: 180 tablet, Rfl: 3 .  clopidogrel (PLAVIX) 75 MG tablet, Take 1 tablet (75 mg total) by mouth daily., Disp: 90 tablet, Rfl: 3 .  ezetimibe (ZETIA) 10 MG tablet, TAKE 1 TABLET BY MOUTH DAILY, Disp: 90 tablet, Rfl: 2 .  isosorbide mononitrate (IMDUR) 30 MG 24 hr tablet, TAKE ONE-HALF TABLET BY MOUTH AT BEDTIME, Disp: 15 tablet, Rfl: 3 .  lisinopril (PRINIVIL,ZESTRIL) 5 MG tablet, TAKE 1 TABLET BY MOUTH EVERY DAY, Disp: 90 tablet, Rfl: 2 .  mirtazapine (REMERON) 15 MG tablet, TAKE 1 TABLET BY MOUTH EVERY NIGHT AT BEDTIME AS NEEDED FOR SLEEP. (Patient taking differently: TAKE 7.5 mg TABLET BY MOUTH EVERY NIGHT AT BEDTIME AS NEEDED FOR SLEEP.), Disp: 90 tablet, Rfl: 3 .  naproxen sodium (ALEVE) 220 MG tablet, Take 220 mg by mouth daily as needed (pain)., Disp: , Rfl:  .  nitroGLYCERIN (NITROSTAT) 0.4 MG SL tablet, Place 1 tablet (0.4 mg total) under the tongue every 5 (five) minutes as needed for chest pain., Disp: 25 tablet, Rfl: 2 .  OVER THE COUNTER MEDICATION, Take 1 capsule by mouth daily. CBD, Disp: , Rfl:  .  pantoprazole (PROTONIX) 40 MG tablet, Take 1 tablet (40 mg total) by mouth daily at 6 (six) AM., Disp: 30 tablet, Rfl: 0 .  rosuvastatin (CRESTOR) 40 MG tablet, TAKE 1 TABLET BY MOUTH DAILY, Disp: 30 tablet, Rfl: 3   Allergies:  Metoclopramide and Metoprolol tartrate  Review of Systems: Gen:  Denies  fever, sweats, chills HEENT: Denies blurred vision, double vision. bleeds, sore throat Cvc:  No dizziness, chest pain.  Resp:   Denies cough or sputum production. Gi: Denies swallowing difficulty, stomach pain. Gu:  Denies bladder incontinence, burning urine Ext:   No Joint pain, stiffness. Skin: No skin rash,  hives  Endoc:  No polyuria, polydipsia. Psych: No depression, insomnia. Other:  All other  systems were reviewed with the patient and were  negative other that what is mentioned in the HPI.   Physical Examination:   VS: BP 122/72 (BP Location: Left Arm, Cuff Size: Normal)   Pulse 79   Ht 5\' 5"  (1.651 m)   Wt 160 lb 3.2 oz (72.7 kg)   SpO2 100%   BMI 26.66 kg/m   General Appearance: No distress  Neuro:without focal findings,  speech normal,  HEENT: PERRLA, EOM intact.   Pulmonary: normal breath sounds, No wheezing.  CardiovascularNormal S1,S2.  No m/r/g.   Abdomen: Benign, Soft, non-tender. Renal:  No costovertebral tenderness  GU:  No performed at this time. Endoc: No evident thyromegaly, no signs of acromegaly. Skin:   warm, no rashes, no ecchymosis  Extremities: normal, no cyanosis, clubbing.  Other findings:    LABORATORY PANEL:   CBC Recent Labs  Lab 10/29/17 0233  WBC 6.2  HGB 11.3*  HCT 33.8*  PLT 199   ------------------------------------------------------------------------------------------------------------------  Chemistries  Recent Labs  Lab 10/28/17 1616 10/29/17 0233  NA  --  136  K  --  4.0  CL  --  104  CO2  --  25  GLUCOSE  --  95  BUN  --  6*  CREATININE  --  0.75  CALCIUM  --  8.8*  AST 30  --   ALT 19  --   ALKPHOS 39  --   BILITOT 0.8  --    ------------------------------------------------------------------------------------------------------------------  Cardiac Enzymes Recent Labs  Lab 10/29/17 0233  TROPONINI <0.03   ------------------------------------------------------------  RADIOLOGY:  No results found.     Thank  you for the consultation and for allowing [facility] to assist in the care of your patient. Our recommendations are noted above.  Please contact us if we can be of further service.   Marda Stalker, M.D., F.C.C.P.  Board Certified in Internal Medicine, Pulmonary Medicine, [place], and Sleep Medicine.  [facility] Office  Number: [phone]   10/31/2017

## 2017-11-01 ENCOUNTER — Ambulatory Visit (INDEPENDENT_AMBULATORY_CARE_PROVIDER_SITE_OTHER): Payer: Medicare Other | Admitting: Cardiovascular Disease

## 2017-11-01 ENCOUNTER — Encounter: Payer: Self-pay | Admitting: Cardiovascular Disease

## 2017-11-01 VITALS — BP 130/72 | HR 61 | Ht 65.0 in | Wt 165.5 lb

## 2017-11-01 DIAGNOSIS — I701 Atherosclerosis of renal artery: Secondary | ICD-10-CM

## 2017-11-01 DIAGNOSIS — I951 Orthostatic hypotension: Secondary | ICD-10-CM | POA: Diagnosis not present

## 2017-11-01 DIAGNOSIS — I25119 Atherosclerotic heart disease of native coronary artery with unspecified angina pectoris: Secondary | ICD-10-CM | POA: Diagnosis not present

## 2017-11-01 DIAGNOSIS — E785 Hyperlipidemia, unspecified: Secondary | ICD-10-CM | POA: Diagnosis not present

## 2017-11-01 DIAGNOSIS — I200 Unstable angina: Secondary | ICD-10-CM

## 2017-11-01 DIAGNOSIS — I10 Essential (primary) hypertension: Secondary | ICD-10-CM

## 2017-11-01 NOTE — Patient Instructions (Signed)

## 2017-11-01 NOTE — Progress Notes (Signed)
Cardiology Office Note  Date:  11/01/2017   ID:  [name], DOB 11/21/1950, MRN [id number]  PCP:  [name] Carbon, MD   Chief Complaint  Patient presents with  . Other    Hospital follow up. Patient c/o chest pain and SOB. Meds reviewed vebally with patient.     HPI:  [name] is a 67 year old gentleman with  coronary artery disease, bypass surgery in 1995,  stenting at [facility] in February 2004 for MI with a 3.0 x 25 mm and 4.5 x 16 mm Monorail ( location uncertain), also 4.5 x 18 mm stent placed to the mid RCA, followup with repeat stenting in April 2005 to the proximal LAD with a Taxus stent 2.5 x 8 mm, and stenting to the proximal left circumflex with a 2.5 x 12 mm Taxus, with  stent placed November 13, 2010 with a 4.0 x 9 mm stent placed to the left main. renal artery stent. Abdominal aortic aneurysm 3.2 cm Prior smoking history 4.1 cm AAA on CT scan 10/2017  He presents for routine followup of his coronary artery disease.  Could not breath,called EMS 100% oxygen,  Cardiac cath 10/25/2017 Details as below No stents  Known Severe Multi-vessel CAD: known CTO of ostRCA, mLAD & SVG-AM-PDA, SVG-OM.  Widely patent LIMA-LAD - retograde fills to 100% CTO, antegrade flow brisk to the Apex.  Prox LM ~40% (stable lesion just prior to the stent) followed by widely patent stents running from pLM-ost-proxCx-OM with brisk flow in both the distal OM & AV Groove Cx --> collaterals to RPL system.  Mldly reduced LVEF with basal-mid Inferior Akinesis.  Normal/Low LVEDP.   Seen by pulmonary, Started on lasix daily Feeling better Going on vacation this weekend Large region of ecchymosis forearm from catheterization site on the left  Total cholesterol 118 LDL 24  EKG personally reviewed by myself on todays visit Shows normal sinus rhythm with rate 61 bpm T-wave abnormality, no change from previous EKGs  Other past medical history reviewed   chest pain 04/05/2014, went  to [facility] head catheterization. This showed an occluded OM vessel with DES placed 2 Occluded mid LAD with a patent LIMA to the LAD, patent left circumflex, prior bare-metal stent into the OM1 was occluded in the distal portion just after the ostium of OM1. Occluded RCA, ejection fraction 35%  Previous episode of chest pain while in [place] with hospitalization at that time, negative stress test, 2013 recurrent chest pain requiring admission to [facility], repeat catheterization performed 12/2011 This showed patent stents to the left circumflex and LAD with patent LIMA to the LAD. Vein graft to the OM and vein graft to the RCA was occluded  admission to [place] at the beginning of July 2014. Notes from the hospital were reviewed. Ruled out for MI with negative cardiac enzymes, CT scan chest showing no PE. He had stress test 10/25/2012 showing old inferior wall MI with defect noted in the basal to mid inferior wall. Discharged on tramadol.   Ultrasound of his aorta in [place] 12/22/2011 showed distal aorta 3 cm, iliacs of normal size Previous lower extremity ultrasound  showed no significant disease  He used to be a smoker though stopped in 1995. Prior to that he smoked 3 packs per day. Smoked for approximately 25-28 years    PMH:   has a past medical history of AAA (abdominal aortic aneurysm) ([place]), Alcohol abuse, CAD (coronary artery disease), Cardiomyopathy ([place]), Diverticulitis, Dyspnea, GERD (gastroesophageal reflux disease), Hyperlipidemia,  Hypertension, Myocardial infarction [facility]), PAD (peripheral artery disease) ([place]), and Renal artery stenosis ([place]).  PSH:    Past Surgical History:  Procedure Laterality Date  . ANGIOPLASTY  1997  . CARDIAC CATHETERIZATION     8/09  Cath--vein graft occlusions which are old--no acute changes  . CARDIAC CATHETERIZATION  11/13/2010   stent x 2 @ [place]  . CARDIAC CATHETERIZATION  04/05/2014   stent placement   . CARDIAC  CATHETERIZATION  10/28/2017  . CLOSED REDUCTION SHOULDER DISLOCATION    . CORONARY ANGIOPLASTY  04/05/2014   stent placement OM 1  . CORONARY ARTERY BYPASS GRAFT    . CORONARY STENT PLACEMENT  7/12   2 stents--Promus element plus (everolimus eluting)--[facility] in [place]  . LEFT HEART CATH AND CORS/GRAFTS ANGIOGRAPHY N/A 10/28/2017   Procedure: LEFT HEART CATH AND CORS/GRAFTS ANGIOGRAPHY;  Surgeon: Leonie Man, MD;  Location: [place] CV LAB;  Service: Cardiovascular;  Laterality: N/A;  . LEFT HEART CATHETERIZATION WITH CORONARY/GRAFT ANGIOGRAM N/A 01/04/2012   Procedure: LEFT HEART CATHETERIZATION WITH Beatrix Fetters;  Surgeon: Burnell Blanks, MD;  Location: [facility] CATH LAB;  Service: Cardiovascular;  Laterality: N/A;  . LEFT HEART CATHETERIZATION WITH CORONARY/GRAFT ANGIOGRAM N/A 04/05/2014   Procedure: LEFT HEART CATHETERIZATION WITH Beatrix Fetters;  Surgeon: Jettie Booze, MD;  Location: [facility] CATH LAB;  Service: Cardiovascular;  Laterality: N/A;  . PERCUTANEOUS CORONARY STENT INTERVENTION (PCI-S)  04/05/2014   Procedure: PERCUTANEOUS CORONARY STENT INTERVENTION (PCI-S);  Surgeon: Jettie Booze, MD;  Location: [facility] CATH LAB;  Service: Cardiovascular;;  OM1  . RENAL ANGIOGRAM N/A 04/16/2011   Procedure: RENAL ANGIOGRAM;  Surgeon: Burnell Blanks, MD;  Location: [facility] CATH LAB;  Service: Cardiovascular;  Laterality: N/A;  . RENAL ARTERY STENT  03/2011 ?    Current Outpatient Medications  Medication Sig Dispense Refill  . aspirin 81 MG tablet Take 81 mg by mouth daily.    . carvedilol (COREG) 3.125 MG tablet Take 1 tablet (3.125 mg total) by mouth 2 (two) times daily. 180 tablet 3  . clopidogrel (PLAVIX) 75 MG tablet Take 1 tablet (75 mg total) by mouth daily. 90 tablet 3  . ezetimibe (ZETIA) 10 MG tablet TAKE 1 TABLET BY MOUTH DAILY 90 tablet 2  . furosemide (LASIX) 20 MG tablet Take 1 tablet (20 mg total) by mouth daily. 30 tablet 1  .  isosorbide mononitrate (IMDUR) 30 MG 24 hr tablet TAKE ONE-HALF TABLET BY MOUTH AT BEDTIME 15 tablet 3  . lisinopril (PRINIVIL,ZESTRIL) 5 MG tablet TAKE 1 TABLET BY MOUTH EVERY DAY 90 tablet 2  . mirtazapine (REMERON) 15 MG tablet TAKE 1 TABLET BY MOUTH EVERY NIGHT AT BEDTIME AS NEEDED FOR SLEEP. (Patient taking differently: TAKE 7.5 mg TABLET BY MOUTH EVERY NIGHT AT BEDTIME AS NEEDED FOR SLEEP.) 90 tablet 3  . naproxen sodium (ALEVE) 220 MG tablet Take 220 mg by mouth daily as needed (pain).    . nitroGLYCERIN (NITROSTAT) 0.4 MG SL tablet Place 1 tablet (0.4 mg total) under the tongue every 5 (five) minutes as needed for chest pain. 25 tablet 2  . OVER THE COUNTER MEDICATION Take 1 capsule by mouth daily. CBD    . pantoprazole (PROTONIX) 40 MG tablet Take 1 tablet (40 mg total) by mouth daily at 6 (six) AM. 30 tablet 0  . potassium chloride (K-DUR) 10 MEQ tablet Take 1 tablet (10 mEq total) by mouth daily. Use while you are on water pill. 30 tablet 1  . rosuvastatin (  CRESTOR) 40 MG tablet TAKE 1 TABLET BY MOUTH DAILY 30 tablet 3   No current facility-administered medications for this visit.      Allergies:   Metoclopramide and Metoprolol tartrate   Social History:  The patient  reports that he quit smoking about 24 years ago. His smoking use included cigarettes. He has a 75.00 pack-year smoking history. He has never used smokeless tobacco. He reports that he drinks about 25.2 oz of alcohol per week. He reports that he does not use drugs.   Family History:   family history includes Coronary artery disease in his brother, father, mother, and sister; Heart attack in his father, maternal grandfather, and mother; Hypertension in his mother.    Review of Systems: Review of Systems  Constitutional: Negative.   HENT:       Dental problems  Respiratory: Negative.   Cardiovascular: Negative.   Gastrointestinal: Negative.   Musculoskeletal: Negative.   Skin:       Bruising left forearm   Neurological: Negative.        Lightheaded  Psychiatric/Behavioral: Negative.   All other systems reviewed and are negative.    PHYSICAL EXAM: VS:  BP 130/72 (BP Location: Right Arm, Patient Position: Sitting, Cuff Size: Normal)   Pulse 61   Ht 5\' 5"  (1.651 m)   Wt 165 lb 8 oz (75.1 kg)   BMI 27.54 kg/m  , BMI Body mass index is 27.54 kg/m. Constitutional:  oriented to person, place, and time. No distress.  HENT:  Head: Normocephalic and atraumatic.  Eyes:  no discharge. No scleral icterus.  Neck: Normal range of motion. Neck supple. No JVD present.  Cardiovascular: Normal rate, regular rhythm, normal heart sounds and intact distal pulses. Exam reveals no gallop and no friction rub. No edema No murmur heard. Pulmonary/Chest: Effort normal and breath sounds normal. No stridor. No respiratory distress.  no wheezes.  no rales.  no tenderness.  Abdominal: Soft.  no distension.  no tenderness.  Musculoskeletal: Normal range of motion.  no  tenderness or deformity.  Neurological:  normal muscle tone. Coordination normal. No atrophy Skin: Skin is warm and dry. No rash noted. not diaphoretic.  Psychiatric:  normal mood and affect. behavior is normal. Thought content normal.    Recent Labs: 10/28/2017: ALT 19; B Natriuretic Peptide 107.8; TSH 2.086 10/29/2017: BUN 6; Creatinine, Ser 0.75; Hemoglobin 11.3; Platelets 199; Potassium 4.0; Sodium 136    Lipid Panel Lab Results  Component Value Date   CHOL 118 10/29/2017   HDL 31 (L) 10/29/2017   LDLCALC 24 10/29/2017   TRIG 316 (H) 10/29/2017      Wt Readings from Last 3 Encounters:  11/01/17 165 lb 8 oz (75.1 kg)  10/31/17 160 lb 3.2 oz (72.7 kg)  10/30/17 156 lb 9.6 oz (71 kg)       ASSESSMENT AND PLAN:   SOB (shortness of breath) on exertion - Plan: EKG 12-Lead History of COPD, smoking Recent symptoms of shortness of breath, etiology unclear Concerning for diastolic CHF Started on Lasix by pulmonary Recent  catheterization showing stable disease On he had symptoms at rest when laying supine, ow resolved  Hyperlipidemia LDL goal <70 Cholesterol is at goal on the current lipid regimen. No changes to the medications were made. Stable. LDL 24  Essential hypertension Blood pressure is well controlled on today's visit. No changes made to the medications. Stable  Atherosclerosis of native coronary artery of native heart with angina pectoris [facility]) Recent cardiac catheterization with  stable disease  AAA (abdominal aortic aneurysm) without rupture (HCC) 3.7 cm December 2017 4.1 cm CT scan July 2019 Repeat ultrasound July 2020  Renal artery stenosis (HCC) 60% renal artery stenosis  Repeat ultrasound done next visit  Long discussion concerning recent hospitalization and cardiac catheterization results  Total encounter time more than 45 minutes  Greater than 50% was spent in counseling and coordination of care with the patient   Disposition:   F/U  12 months   No orders of the defined types were placed in this encounter.    Signed, Esmond Plants, M.D., Ph.D. 11/01/2017  [place], [place]

## 2017-11-10 ENCOUNTER — Other Ambulatory Visit: Payer: Self-pay | Admitting: Cardiovascular Disease

## 2017-11-14 ENCOUNTER — Other Ambulatory Visit: Payer: Self-pay | Admitting: Cardiovascular Disease

## 2017-11-22 ENCOUNTER — Other Ambulatory Visit: Payer: Self-pay | Admitting: Physician Assistant

## 2017-11-23 ENCOUNTER — Other Ambulatory Visit: Payer: Self-pay | Admitting: Internal Medicine

## 2017-11-25 ENCOUNTER — Other Ambulatory Visit: Payer: Self-pay

## 2018-01-03 MED ORDER — FUROSEMIDE 20 MG PO TABS: 20.0000 mg | ORAL_TABLET | Freq: Every day | ORAL | 1 refills | Status: DC

## 2018-01-03 NOTE — Telephone Encounter (Signed)
Review for refill. 

## 2018-01-03 NOTE — Telephone Encounter (Signed)
Please advise if ok to refill. Rx originally Prescribed by Dr. Ashby Dawes.

## 2018-01-03 NOTE — Telephone Encounter (Signed)
Made in error

## 2018-01-03 NOTE — Telephone Encounter (Signed)
Would forward to Dr. Ashby Dawes for refill.   Thanks!

## 2018-01-13 ENCOUNTER — Encounter: Payer: Medicare Other | Admitting: Internal Medicine

## 2018-02-09 ENCOUNTER — Ambulatory Visit (INDEPENDENT_AMBULATORY_CARE_PROVIDER_SITE_OTHER): Payer: Medicare Other | Admitting: Internal Medicine

## 2018-02-09 ENCOUNTER — Encounter: Payer: Self-pay | Admitting: Internal Medicine

## 2018-02-09 VITALS — BP 126/68 | HR 61 | Temp 97.2°F | Ht 63.75 in | Wt 156.0 lb

## 2018-02-09 DIAGNOSIS — Z7189 Other specified counseling: Secondary | ICD-10-CM | POA: Diagnosis not present

## 2018-02-09 DIAGNOSIS — I5042 Chronic combined systolic (congestive) and diastolic (congestive) heart failure: Secondary | ICD-10-CM | POA: Insufficient documentation

## 2018-02-09 DIAGNOSIS — Z23 Encounter for immunization: Secondary | ICD-10-CM

## 2018-02-09 DIAGNOSIS — J449 Chronic obstructive pulmonary disease, unspecified: Secondary | ICD-10-CM | POA: Diagnosis not present

## 2018-02-09 DIAGNOSIS — Z0000 Encounter for general adult medical examination without abnormal findings: Secondary | ICD-10-CM | POA: Diagnosis not present

## 2018-02-09 DIAGNOSIS — I200 Unstable angina: Secondary | ICD-10-CM

## 2018-02-09 DIAGNOSIS — I5032 Chronic diastolic (congestive) heart failure: Secondary | ICD-10-CM | POA: Diagnosis not present

## 2018-02-09 DIAGNOSIS — I25119 Atherosclerotic heart disease of native coronary artery with unspecified angina pectoris: Secondary | ICD-10-CM | POA: Diagnosis not present

## 2018-02-09 NOTE — Addendum Note (Signed)
Addended by: Pilar Grammes on: 02/09/2018 03:14 PM   Modules accepted: Orders

## 2018-02-09 NOTE — Assessment & Plan Note (Signed)
Echo showed borderline low EF and diastolic dysfunction Likely had mild CHF exacerbation as cause of the SOB in July Discussed using the furosemide again if he starts getting SOB

## 2018-02-09 NOTE — Progress Notes (Signed)
Subjective:    Patient ID: [name], male    DOB: 1950/10/15, 67 y.o.   MRN: [id number]  HPI Here for Medicare wellness visit and follow up of chronic health conditions Reviewed form and advanced directives Reviewed other doctors Has 3-4 beers most days No tobacco Not exercising---except for lawn work (weed whacks long driveway, etc) No falls No depression or anhedonia Vision is fine. Wife notes hearing problems---but he thinks there is no problem   Hospitalized in July Went on for over a day--to ER Had cath---no sig findings Did see pulmonologist ---- some degree of COPD Started furosemide--review of echo suggestive of diastolic CHF Better now---stopped the furosemide Did have some chest discomfort----"I couldn't get enough air in" Had bad hematoma from the radial cath site---took a long time to resolve  No chest pain since No palpitations No dizziness or syncope No edema--usually wears compression socks (has had in the past)  Current Outpatient Medications on File Prior to Visit  Medication Sig Dispense Refill  . aspirin 81 MG tablet Take 81 mg by mouth daily.    . carvedilol (COREG) 3.125 MG tablet Take 1 tablet (3.125 mg total) by mouth 2 (two) times daily. 180 tablet 3  . clopidogrel (PLAVIX) 75 MG tablet Take 1 tablet (75 mg total) by mouth daily. 90 tablet 3  . ezetimibe (ZETIA) 10 MG tablet TAKE 1 TABLET BY MOUTH DAILY 90 tablet 2  . isosorbide mononitrate (IMDUR) 30 MG 24 hr tablet TAKE ONE-HALF TABLET BY MOUTH AT BEDTIME 45 tablet 1  . lisinopril (PRINIVIL,ZESTRIL) 5 MG tablet TAKE 1 TABLET BY MOUTH EVERY DAY 90 tablet 2  . mirtazapine (REMERON) 15 MG tablet TAKE 1 TABLET BY MOUTH EVERY NIGHT AT BEDTIME AS NEEDED FOR SLEEP. (Patient taking differently: TAKE 7.5 mg TABLET BY MOUTH EVERY NIGHT AT BEDTIME AS NEEDED FOR SLEEP.) 90 tablet 3  . naproxen sodium (ALEVE) 220 MG tablet Take 220 mg by mouth daily as needed (pain).    . nitroGLYCERIN (NITROSTAT) 0.4 MG  SL tablet Place 1 tablet (0.4 mg total) under the tongue every 5 (five) minutes as needed for chest pain. 25 tablet 2  . OVER THE COUNTER MEDICATION Take 1 capsule by mouth daily. CBD    . pantoprazole (PROTONIX) 40 MG tablet TAKE 1 TABLET (40 MG TOTAL) BY MOUTH DAILY AT 6 (SIX) AM. 30 tablet 3  . rosuvastatin (CRESTOR) 40 MG tablet TAKE 1 TABLET BY MOUTH DAILY 90 tablet 3   No current facility-administered medications on file prior to visit.     Allergies  Allergen Reactions  . Metoclopramide     Other reaction(s): Unknown  . Metoprolol Tartrate Hives and Itching    Past Medical History:  Diagnosis Date  . AAA (abdominal aortic aneurysm) ([place])    a. Duplex 01/2012: stable infrarenal saccular AAA at 3.3cm x 3.2xm (f/u recommended 01/2013 per Dr. Rockey Situ)  . Alcohol abuse   . CAD (coronary artery disease)    a. 1995 s/p CABG x 4: LIMA->LAD, VG->RI (known to be occluded), VG->AM->PDA;  b. 05/2002 Inf STEMI: VG->AM->PDA 100% treated w/ 2 BMS complicated by acute thrombosis req 3 BMS;  c. 07/2003 DES to  LAD & LCX (VG's to PDA & RI 100%);  d. 12/2010 Acute MI ([place]): DES to LCX & LM , LIMA ok,;  e.12/2011 Cath: LM/LCX stents ok , LIMA patent.  . Cardiomyopathy ([place])    a. EF 35% by cath 2015.  . Diverticulitis    1/06 Diverticulitis--CT  of pelvis--diffuse sigmoid divertic  . Dyspnea   . GERD (gastroesophageal reflux disease)   . Hyperlipidemia   . Hypertension   . Myocardial infarction ([place])   . PAD (peripheral artery disease) (HCC)    a. external iliac and mesenteric stenosis noted by noninvasive imaging.  . Renal artery stenosis ([place])    a. 03/2011 PTA and stenting of L RA. b. last duplex 2016 with stable 1-59% bilateral RAS, incidental >50% R EIA stenosis    Past Surgical History:  Procedure Laterality Date  . ANGIOPLASTY  1997  . CARDIAC CATHETERIZATION     8/09  Cath--vein graft occlusions which are old--no acute changes  . CARDIAC CATHETERIZATION  11/13/2010   stent x 2 @ [place]  . CARDIAC CATHETERIZATION  04/05/2014   stent placement   . CARDIAC CATHETERIZATION  10/28/2017  . CLOSED REDUCTION SHOULDER DISLOCATION    . CORONARY ANGIOPLASTY  04/05/2014   stent placement OM 1  . CORONARY ARTERY BYPASS GRAFT    . CORONARY STENT PLACEMENT  7/12   2 stents--Promus element plus (everolimus eluting)--[facility] in [place]  . LEFT HEART CATH AND CORS/GRAFTS ANGIOGRAPHY N/A 10/28/2017   Procedure: LEFT HEART CATH AND CORS/GRAFTS ANGIOGRAPHY;  Surgeon: Leonie Man, MD;  Location: [place] CV LAB;  Service: Cardiovascular;  Laterality: N/A;  . LEFT HEART CATHETERIZATION WITH CORONARY/GRAFT ANGIOGRAM N/A 01/04/2012   Procedure: LEFT HEART CATHETERIZATION WITH Beatrix Fetters;  Surgeon: Burnell Blanks, MD;  Location: [facility] CATH LAB;  Service: Cardiovascular;  Laterality: N/A;  . LEFT HEART CATHETERIZATION WITH CORONARY/GRAFT ANGIOGRAM N/A 04/05/2014   Procedure: LEFT HEART CATHETERIZATION WITH Beatrix Fetters;  Surgeon: Jettie Booze, MD;  Location: [facility] CATH LAB;  Service: Cardiovascular;  Laterality: N/A;  . PERCUTANEOUS CORONARY STENT INTERVENTION (PCI-S)  04/05/2014   Procedure: PERCUTANEOUS CORONARY STENT INTERVENTION (PCI-S);  Surgeon: Jettie Booze, MD;  Location[facility] CATH LAB;  Service: Cardiovascular;;  OM1  . RENAL ANGIOGRAM N/A 04/16/2011   Procedure: RENAL ANGIOGRAM;  Surgeon: Burnell Blanks, MD;  Location: [facility] CATH LAB;  Service: Cardiovascular;  Laterality: N/A;  . RENAL ARTERY STENT  03/2011 ?    Family History  Problem Relation Age of Onset  . Coronary artery disease Mother        Died MI age 90  . Hypertension Mother   . Heart attack Mother   . Coronary artery disease Sister        Living  . Coronary artery disease Brother        Living  . Coronary artery disease Father        Died MI age 86  . Heart attack Father   . Heart attack Maternal Grandfather   . Diabetes Neg Hx   . Cancer Neg Hx          prostate or colon    Social History   Socioeconomic History  . Marital status: Married    Spouse name: Not on file  . Number of children: 3  . Years of education: Not on file  . Highest education level: Not on file  Occupational History  . 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 Financial resource strain: Not on file  . Food insecurity:    Worry: Not on file    Inability: Not on file  . Transportation needs:    Medical: Not on file    Non-medical: Not on file  Tobacco Use  .  Smoking status: Former Smoker    Packs/day: 3.00    Years: 25.00    Pack years: 75.00    Types: Cigarettes    Last attempt to quit: 05/28/1993    Years since quitting: 24.7  . Smokeless tobacco: Never Used  Substance and Sexual Activity  . Alcohol use: Yes    Alcohol/week: 42.0 standard drinks    Types: 42 Cans of beer per week  . Drug use: No  . Sexual activity: Not on file  Lifestyle  . Physical activity:    Days per week: Not on file    Minutes per session: Not on file  . Stress: Not on file  Relationships  . Social connections:    Talks on phone: Not on file    Gets together: Not on file    Attends religious service: Not on file    Active member of club or organization: Not on file    Attends meetings of clubs or organizations: Not on file    Relationship status: Not on file  . Intimate partner violence:    Fear of current or ex partner: Not on file    Emotionally abused: Not on file    Physically abused: Not on file    Forced sexual activity: Not on file  Other Topics Concern  . Not on file  Social History Narrative   No living will   No health care POA but requests wife--then children   Would accept resuscitation   Not sure about tube feeds   Review of Systems Full dentures---doesn't need dentist No skin rash or lesions Appetite isn't that great--still trouble swallowing with the dentures Weight fluctuates a lot--- as  much as 10# in short time Does take omeprazole daily---seems to control heartburn better than protonix. Discussed the concerns with the plavix Wears seat belt Generally sleeps okay Bowels move frequently---as many as 6-7 in AM (then it stops). No blood Voids okay. Stream is fine and no dribbling Some left hip pain--relates to injury in 2004--getting thrown off horse (uses aleve twice a year or something)    Objective:   Physical Exam  Constitutional: He is oriented to person, place, and time. He appears well-developed. No distress.  HENT:  Mouth/Throat: Oropharynx is clear and moist. No oropharyngeal exudate.  Neck: No thyromegaly present.  Cardiovascular: Normal rate, regular rhythm and normal heart sounds. Exam reveals no gallop.  No murmur heard. Faint pedal pulses  Respiratory: Effort normal. No respiratory distress. He has no wheezes. He has no rales.  Slightly decreased breath sounds but clear  GI: Soft. There is no tenderness.  Musculoskeletal: He exhibits no edema or tenderness.  Lymphadenopathy:    He has no cervical adenopathy.  Neurological: He is alert and oriented to person, place, and time.  President--- "Daisy Floro, Barack Quay Burow" [phone] [id number] Recall 3/3  Skin: No rash noted. No erythema.  Psychiatric: He has a normal mood and affect. His behavior is normal.           Assessment & Plan:

## 2018-02-09 NOTE — Progress Notes (Signed)
Hearing Screening   Method: Audiometry   125Hz 250Hz 500Hz 1000Hz 2000Hz 3000Hz 4000Hz 6000Hz 8000Hz  Right ear:   20 20 20  20    Left ear:   20 20 20  20      Visual Acuity Screening   Right eye Left eye Both eyes  Without correction:     With correction: 20/20 20/20 20/15    

## 2018-02-09 NOTE — Patient Instructions (Addendum)
Please try famotidine 40mg  twice a day or nexium 20-40mg  a day instead of the prilosec. Please let me know if you are willing to have a colonoscopy.

## 2018-02-09 NOTE — Assessment & Plan Note (Signed)
I have personally reviewed the Medicare Annual Wellness questionnaire and have noted 1. The patient's medical and social history 2. Their use of alcohol, tobacco or illicit drugs 3. Their current medications and supplements 4. The patient's functional ability including ADL's, fall risks, home safety risks and hearing or visual             impairment. 5. Diet and physical activities 6. Evidence for depression or mood disorders  The patients weight, height, BMI and visual acuity have been recorded in the chart I have made referrals, counseling and provided education to the patient based review of the above and I have provided the pt with a written personalized care plan for preventive services.  I have provided you with a copy of your personalized plan for preventive services. Please take the time to review along with your updated medication list.  Discussed some exercise Flu vaccine today Still prefers no cancer screening

## 2018-02-09 NOTE — Assessment & Plan Note (Signed)
See social history 

## 2018-02-09 NOTE — Assessment & Plan Note (Signed)
No pain now --on isosorbide Not clear he had true angina in July--?CHF

## 2018-02-09 NOTE — Assessment & Plan Note (Signed)
Past smoking Diagnosed by Dr Ramachandran---but mild No Rx for now Could be cofactor in his spell---especially if chlorine exposure from pool chemicals before his dyspnea

## 2018-03-10 ENCOUNTER — Other Ambulatory Visit: Payer: Self-pay

## 2018-03-30 ENCOUNTER — Telehealth: Payer: Self-pay | Admitting: Cardiovascular Disease

## 2018-03-30 DIAGNOSIS — I714 Abdominal aortic aneurysm, without rupture, unspecified: Secondary | ICD-10-CM

## 2018-03-30 NOTE — Telephone Encounter (Signed)
I spoke with the patient. He was concerned as to when he should have his AAA followed up on again. I advised that per Dr. Donivan Scull last office note from 11/01/17:  "AAA (abdominal aortic aneurysm) without rupture (HCC) 3.7 cm December 2017 4.1 cm CT scan July 2019 Repeat ultrasound July 2020"  I have advised him that his abdominal aorta duplex will go in a recall from July 2020. We will be in touch with him closer to time to schedule.  In looking at his follow up with Dr. Rockey Situ, the AVS says 6 months, but the MD note states 12 months. The patient is aware I will clarify his follow up with Dr. Rockey Situ, but at this time he states he is doing well and that he is ok to wait until July 2020 for follow up. I advised I will only call back if Dr. Rockey Situ wants him seen sooner.   He is agreeable.

## 2018-03-30 NOTE — Telephone Encounter (Signed)
Patient calling Would like to know if he got his ultrasounds done when he was in the hospital in July or if he will need to repeat them in January - he cannot remember what tests were completed Please call to discuss

## 2018-04-04 NOTE — Telephone Encounter (Signed)
Spoke with patient and reviewed providers recommendations to follow up after AAA scan unless he is having problems with shortness of breath or swelling. He denies any swelling and no shortness of breath. Advised that he would be due for imaging in July 2020 and could follow up after that if needed. He verbalized understanding with no further questions at this time.

## 2018-04-04 NOTE — Telephone Encounter (Signed)
We can see him 6 months after his prior clinic visit if he would like  Prior visit July 2019 If he feels that everything is stable we can see him after July following his scan looking at AAA Reason for earlier visit might be for fluid buildup, shortness of breath

## 2018-04-30 ENCOUNTER — Other Ambulatory Visit: Payer: Self-pay | Admitting: Cardiovascular Disease

## 2018-05-01 ENCOUNTER — Telehealth: Payer: Self-pay | Admitting: Cardiovascular Disease

## 2018-05-01 ENCOUNTER — Other Ambulatory Visit: Payer: Self-pay | Admitting: *Deleted

## 2018-05-01 MED ORDER — LISINOPRIL 5 MG PO TABS: 5.0000 mg | ORAL_TABLET | Freq: Every day | ORAL | 2 refills | Status: DC

## 2018-05-01 MED ORDER — CARVEDILOL 3.125 MG PO TABS: 3.1250 mg | ORAL_TABLET | Freq: Two times a day (BID) | ORAL | 2 refills | Status: DC

## 2018-05-01 NOTE — Telephone Encounter (Signed)
Requested Prescriptions   Signed Prescriptions Disp Refills  . carvedilol (COREG) 3.125 MG tablet 180 tablet 2    Sig: Take 1 tablet (3.125 mg total) by mouth 2 (two) times daily.    Authorizing Provider: Minna Merritts    Ordering User: Eugenio Hoes, MARINA C  . lisinopril (PRINIVIL,ZESTRIL) 5 MG tablet 90 tablet 2    Sig: Take 1 tablet (5 mg total) by mouth daily.    Authorizing Provider: Minna Merritts    Ordering User: Britt Bottom

## 2018-05-01 NOTE — Telephone Encounter (Signed)
Patient calling to check on status of medication refills  States that he would like refills for medications carvedilol and lisinopril sent to [facility] in [place] so he will not have to call back  Patient's recall was set for 6 months when it should have been 12 months which has been reentered correctly Please advise

## 2018-05-13 ENCOUNTER — Other Ambulatory Visit: Payer: Self-pay | Admitting: Internal Medicine

## 2018-05-23 ENCOUNTER — Ambulatory Visit (INDEPENDENT_AMBULATORY_CARE_PROVIDER_SITE_OTHER): Payer: Medicare Other | Admitting: Family Medicine

## 2018-05-23 ENCOUNTER — Encounter: Payer: Self-pay | Admitting: Family Medicine

## 2018-05-23 VITALS — BP 114/72 | HR 60 | Temp 97.9°F | Resp 10 | Ht 63.75 in | Wt 160.0 lb

## 2018-05-23 DIAGNOSIS — Z9109 Other allergy status, other than to drugs and biological substances: Secondary | ICD-10-CM | POA: Diagnosis not present

## 2018-05-23 DIAGNOSIS — J069 Acute upper respiratory infection, unspecified: Secondary | ICD-10-CM | POA: Diagnosis not present

## 2018-05-23 MED ORDER — LORATADINE 10 MG PO TABS: 10.0000 mg | ORAL_TABLET | Freq: Every day | ORAL | 11 refills | Status: DC

## 2018-05-23 NOTE — Patient Instructions (Signed)
Based on your symptoms, it looks like you have a virus.   Antibiotics are not need for a viral infection but the following will help:   1. Drink plenty of fluids 2. Get lots of rest  Sinus Congestion 1) Neti Pot (Saline rinse) -- 2 times day -- if tolerated 2) Flonase (Store Brand ok) - once daily 3) Over the counter congestion medications 4) Claritin - which I sent to your pharmacy - store brand OK  Cough 1) Cough drops can be helpful 2) Nyquil (or nighttime cough medication) 3) Honey is proven to be one of the best cough medications   Sore Throat 1) Honey as above, cough drops 2) Ibuprofen or Aleve can be helpful 3) Salt water Gargles  If you develop fevers (Temperature >100.4), chills, worsening symptoms or symptoms lasting longer than 10 days return to clinic.

## 2018-05-23 NOTE — Progress Notes (Signed)
Subjective:     [name] is a 68 y.o. male presenting for Generalized Body Aches (Started to not feel good sunday morning 05/21/2018. Cough, sneezing, sore throat, runny nose, post nasal drainage. No fever. Took Aleve yesterday.)     URI   This is a new problem. The current episode started in the past 7 days. The problem has been waxing and waning. There has been no fever. Associated symptoms include coughing, nausea, rhinorrhea, sneezing and a sore throat. Pertinent negatives include no chest pain, congestion, diarrhea, ear pain, headaches, sinus pain or vomiting. He has tried NSAIDs for the symptoms. The treatment provided no relief.   Worse in the morning and improves at the day goes on  Review of Systems  Constitutional: Negative for chills and fever.  HENT: Positive for postnasal drip, rhinorrhea, sneezing, sore throat and trouble swallowing. Negative for congestion, ear pain, sinus pressure and sinus pain.   Respiratory: Positive for cough. Negative for chest tightness and shortness of breath.   Cardiovascular: Negative for chest pain.  Gastrointestinal: Positive for nausea. Negative for constipation, diarrhea and vomiting.  Musculoskeletal: Positive for arthralgias and myalgias.  Neurological: Negative for headaches.     Social History   Tobacco Use  Smoking Status Former Smoker  . Packs/day: 3.00  . Years: 25.00  . Pack years: 75.00  . Types: Cigarettes  . Last attempt to quit: 05/28/1993  . Years since quitting: 25.0  Smokeless Tobacco Never Used        Objective:    BP Readings from Last 3 Encounters:  05/23/18 114/72  02/09/18 126/68  11/01/17 130/72   Wt Readings from Last 3 Encounters:  05/23/18 160 lb (72.6 kg)  02/09/18 156 lb (70.8 kg)  11/01/17 165 lb 8 oz (75.1 kg)    BP 114/72   Pulse 60   Temp 97.9 F (36.6 C)   Resp 10   Ht 5' 3.75" (1.619 m)   Wt 160 lb (72.6 kg)   SpO2 99%   BMI 27.68 kg/m    Physical Exam Constitutional:        General: He is not in acute distress.    Appearance: He is well-developed. He is not ill-appearing.  HENT:     Head: Normocephalic and atraumatic.     Right Ear: Tympanic membrane and ear canal normal.     Left Ear: Tympanic membrane and ear canal normal.     Nose: Mucosal edema and rhinorrhea present.     Right Sinus: No maxillary sinus tenderness or frontal sinus tenderness.     Left Sinus: No maxillary sinus tenderness or frontal sinus tenderness.     Mouth/Throat:     Pharynx: Uvula midline. Posterior oropharyngeal erythema present. No oropharyngeal exudate.     Tonsils: Swelling: 0 on the right. 0 on the left.  Neck:     Musculoskeletal: Neck supple.  Cardiovascular:     Rate and Rhythm: Normal rate and regular rhythm.     Heart sounds: No murmur.  Pulmonary:     Effort: Pulmonary effort is normal. No respiratory distress.     Breath sounds: Normal breath sounds.  Lymphadenopathy:     Cervical: No cervical adenopathy.  Skin:    General: Skin is warm and dry.     Capillary Refill: Capillary refill takes less than 2 seconds.  Neurological:     Mental Status: He is alert.           Assessment & Plan:   Problem  List Items Addressed This Visit    None    Visit Diagnoses    Environmental allergies    -  Primary   Relevant Medications   loratadine (CLARITIN) 10 MG tablet   Viral URI         Symptoms likely viral, but may have some allergy component given sneezing/rhinorrhea and only happening certain times of the day.   Trial of loratadine, but also encouraged neti pot and flonase for cold symptoms.   Return if symptoms worsen or fail to improve.  Lesleigh Noe, MD

## 2018-07-13 ENCOUNTER — Other Ambulatory Visit: Payer: Self-pay | Admitting: Internal Medicine

## 2018-07-13 MED ORDER — MIRTAZAPINE 15 MG PO TABS: 15.0000 mg | ORAL_TABLET | Freq: Every day | ORAL | 3 refills | Status: DC

## 2018-08-03 ENCOUNTER — Other Ambulatory Visit: Payer: Self-pay

## 2018-08-03 MED ORDER — ISOSORBIDE MONONITRATE ER 30 MG PO TB24: 15.0000 mg | ORAL_TABLET | Freq: Every day | ORAL | 1 refills | Status: DC

## 2018-10-05 ENCOUNTER — Other Ambulatory Visit: Payer: Self-pay

## 2018-10-05 MED ORDER — EZETIMIBE 10 MG PO TABS: 10.0000 mg | ORAL_TABLET | Freq: Every day | ORAL | 2 refills | Status: DC

## 2018-10-17 NOTE — Telephone Encounter (Signed)
Spoke to pt. Made appt 1:45 10-19-18

## 2018-10-19 ENCOUNTER — Other Ambulatory Visit: Payer: Self-pay

## 2018-10-19 ENCOUNTER — Encounter: Payer: Self-pay | Admitting: Internal Medicine

## 2018-10-19 ENCOUNTER — Ambulatory Visit (INDEPENDENT_AMBULATORY_CARE_PROVIDER_SITE_OTHER): Payer: Medicare Other | Admitting: Internal Medicine

## 2018-10-19 VITALS — BP 120/80 | HR 69 | Temp 98.0°F | Ht 63.75 in | Wt 161.0 lb

## 2018-10-19 DIAGNOSIS — J410 Simple chronic bronchitis: Secondary | ICD-10-CM | POA: Diagnosis not present

## 2018-10-19 DIAGNOSIS — I25119 Atherosclerotic heart disease of native coronary artery with unspecified angina pectoris: Secondary | ICD-10-CM

## 2018-10-19 DIAGNOSIS — R152 Fecal urgency: Secondary | ICD-10-CM | POA: Insufficient documentation

## 2018-10-19 DIAGNOSIS — R6881 Early satiety: Secondary | ICD-10-CM

## 2018-10-19 LAB — COMPREHENSIVE METABOLIC PANEL
ALT: 46 U/L (ref 0–53)
AST: 73 U/L — ABNORMAL HIGH (ref 0–37)
Albumin: 4 g/dL (ref 3.5–5.2)
Alkaline Phosphatase: 47 U/L (ref 39–117)
BUN: 9 mg/dL (ref 6–23)
CO2: 26 mEq/L (ref 19–32)
Calcium: 9 mg/dL (ref 8.4–10.5)
Chloride: 98 mEq/L (ref 96–112)
Creatinine, Ser: 0.7 mg/dL (ref 0.40–1.50)
GFR: 112.12 mL/min (ref >60.00)
Glucose, Bld: 112 mg/dL — ABNORMAL HIGH (ref 70–99)
Potassium: 4.4 mEq/L (ref 3.5–5.1)
Sodium: 131 mEq/L — ABNORMAL LOW (ref 135–145)
Total Bilirubin: 0.6 mg/dL (ref 0.2–1.2)
Total Protein: 6.7 g/dL (ref 6.0–8.3)

## 2018-10-19 LAB — CBC
HCT: 38.1 % — ABNORMAL LOW (ref 39.0–52.0)
Hemoglobin: 12.9 g/dL — ABNORMAL LOW (ref 13.0–17.0)
MCHC: 33.9 g/dL (ref 30.0–36.0)
MCV: 96.3 fl (ref 78.0–100.0)
Platelets: 239 10*3/uL (ref 150.0–400.0)
RBC: 3.95 Mil/uL — ABNORMAL LOW (ref 4.22–5.81)
RDW: 15.6 % — ABNORMAL HIGH (ref 11.5–15.5)
WBC: 6.9 10*3/uL (ref 4.0–10.5)

## 2018-10-19 LAB — LIPID PANEL
Cholesterol: 169 mg/dL (ref 0–200)
HDL: 44.3 mg/dL (ref >39.00)
Total CHOL/HDL Ratio: 4
Triglycerides: 492 mg/dL — ABNORMAL HIGH (ref 0.0–149.0)

## 2018-10-19 LAB — LDL CHOLESTEROL, DIRECT: Direct LDL: 62 mg/dL

## 2018-10-19 LAB — SEDIMENTATION RATE: Sed Rate: 14 mm/hr (ref 0–20)

## 2018-10-19 NOTE — Assessment & Plan Note (Signed)
And change in bowels Will check labs GI evaluation--has never had colonoscopy

## 2018-10-19 NOTE — Progress Notes (Signed)
Subjective:    Patient ID: [name], male    DOB: 08-Apr-1951, 68 y.o.   MRN: [id number]  HPI Here due to several symptoms Wife did some reading and was concerned about RA Some pain in hips/knees but not really hands  Having rectal urgency--especially when he is up walking Tends to be loose Appetite is not good  Goes back over a year "I haven't eaten a meal since I don't know when" Gets early satiety Has tried to increase meal frequency Does seem to tolerate bananas more--so eats 2-3 per day Some abdominal pain--feels like he is stuffed ("Like after a Thanksgiving meal")  DOE is stable---like pushing a heavy wheelbarrow  No cough other than first 15-20 minutes every morning No chest pain No dizziness or syncope Wears compressionn stockings---feels like his ankles are swollen  He takes prilosec daily--protonix didn't help No heartburn  No dysphagia  Current Outpatient Medications on File Prior to Visit  Medication Sig Dispense Refill  . aspirin 81 MG tablet Take 81 mg by mouth daily.    . carvedilol (COREG) 3.125 MG tablet Take 1 tablet (3.125 mg total) by mouth 2 (two) times daily. 180 tablet 2  . clopidogrel (PLAVIX) 75 MG tablet Take 1 tablet (75 mg total) by mouth daily. 90 tablet 3  . ezetimibe (ZETIA) 10 MG tablet Take 1 tablet (10 mg total) by mouth daily. 90 tablet 2  . isosorbide mononitrate (IMDUR) 30 MG 24 hr tablet Take 0.5 tablets (15 mg total) by mouth at bedtime. 45 tablet 1  . lisinopril (PRINIVIL,ZESTRIL) 5 MG tablet Take 1 tablet (5 mg total) by mouth daily. 90 tablet 2  . loratadine (CLARITIN) 10 MG tablet Take 1 tablet (10 mg total) by mouth daily. 30 tablet 11  . mirtazapine (REMERON) 15 MG tablet Take 1 tablet (15 mg total) by mouth at bedtime. 90 tablet 3  . naproxen sodium (ALEVE) 220 MG tablet Take 220 mg by mouth daily as needed (pain).    . nitroGLYCERIN (NITROSTAT) 0.4 MG SL tablet Place 1 tablet (0.4 mg total) under the tongue every 5 (five)  minutes as needed for chest pain. 25 tablet 2  . OVER THE COUNTER MEDICATION Take 1 capsule by mouth daily. CBD    . pantoprazole (PROTONIX) 40 MG tablet TAKE 1 TABLET (40 MG TOTAL) BY MOUTH DAILY AT 6 (SIX) AM. 30 tablet 3  . rosuvastatin (CRESTOR) 40 MG tablet TAKE 1 TABLET BY MOUTH DAILY 90 tablet 3   No current facility-administered medications on file prior to visit.     Allergies  Allergen Reactions  . Metoclopramide     Other reaction(s): Unknown  . Metoprolol Tartrate Hives and Itching    Past Medical History:  Diagnosis Date  . AAA (abdominal aortic aneurysm) ([place])    a. Duplex 01/2012: stable infrarenal saccular AAA at 3.3cm x 3.2xm (f/u recommended 01/2013 per Dr. Rockey Situ)  . Alcohol abuse   . CAD (coronary artery disease)    a. 1995 s/p CABG x 4: LIMA->LAD, VG->RI (known to be occluded), VG->AM->PDA;  b. 05/2002 Inf STEMI: VG->AM->PDA 100% treated w/ 2 BMS complicated by acute thrombosis req 3 BMS;  c. 07/2003 DES to  LAD & LCX (VG's to PDA & RI 100%);  d. 12/2010 Acute MI ([place]): DES to LCX & LM , LIMA ok,;  e.12/2011 Cath: LM/LCX stents ok , LIMA patent.  . Cardiomyopathy ([place])    a. EF 35% by cath 2015.  . Diverticulitis  1/06 Diverticulitis--CT of pelvis--diffuse sigmoid divertic  . Dyspnea   . GERD (gastroesophageal reflux disease)   . Hyperlipidemia   . Hypertension   . Myocardial infarction ([place])   . PAD (peripheral artery disease) ([facility])    a. external iliac and mesenteric stenosis noted by noninvasive imaging.  . Renal artery stenosis ([place])    a. 03/2011 PTA and stenting of L RA. b. last duplex 2016 with stable 1-59% bilateral RAS, incidental >50% R EIA stenosis    Past Surgical History:  Procedure Laterality Date  . ANGIOPLASTY  1997  . CARDIAC CATHETERIZATION     8/09  Cath--vein graft occlusions which are old--no acute changes  . CARDIAC CATHETERIZATION  11/13/2010   stent x 2 @ [place]  . CARDIAC CATHETERIZATION  04/05/2014   stent placement   .  CARDIAC CATHETERIZATION  10/28/2017  . CLOSED REDUCTION SHOULDER DISLOCATION    . CORONARY ANGIOPLASTY  04/05/2014   stent placement OM 1  . CORONARY ARTERY BYPASS GRAFT    . CORONARY STENT PLACEMENT  7/12   2 stents--Promus element plus (everolimus eluting)--[facility] in [place]  . LEFT HEART CATH AND CORS/GRAFTS ANGIOGRAPHY N/A 10/28/2017   Procedure: LEFT HEART CATH AND CORS/GRAFTS ANGIOGRAPHY;  Surgeon: Leonie Man, MD;  Location: [facility];  Service: Cardiovascular;  Laterality: N/A;  . LEFT HEART CATHETERIZATION WITH CORONARY/GRAFT ANGIOGRAM N/A 01/04/2012   Procedure: LEFT HEART CATHETERIZATION WITH Beatrix Fetters;  Surgeon: Burnell Blanks, MD;  Location: [facility] CATH LAB;  Service: Cardiovascular;  Laterality: N/A;  . LEFT HEART CATHETERIZATION WITH CORONARY/GRAFT ANGIOGRAM N/A 04/05/2014   Procedure: LEFT HEART CATHETERIZATION WITH Beatrix Fetters;  Surgeon: Jettie Booze, MD;  Location: [facility] CATH LAB;  Service: Cardiovascular;  Laterality: N/A;  . PERCUTANEOUS CORONARY STENT INTERVENTION (PCI-S)  04/05/2014   Procedure: PERCUTANEOUS CORONARY STENT INTERVENTION (PCI-S);  Surgeon: Jettie Booze, MD;  Location: [facility] CATH LAB;  Service: Cardiovascular;;  OM1  . RENAL ANGIOGRAM N/A 04/16/2011   Procedure: RENAL ANGIOGRAM;  Surgeon: Burnell Blanks, MD;  Location: [facility] CATH LAB;  Service: Cardiovascular;  Laterality: N/A;  . RENAL ARTERY STENT  03/2011 ?    Family History  Problem Relation Age of Onset  . Coronary artery disease Mother        Died MI age 60  . Hypertension Mother   . Heart attack Mother   . Coronary artery disease Sister        Living  . Coronary artery disease Brother        Living  . Coronary artery disease Father        Died MI age 37  . Heart attack Father   . Heart attack Maternal Grandfather   . Diabetes Neg Hx   . Cancer Neg Hx        prostate or colon    Social History   Socioeconomic  History  . Marital status: Married    Spouse name: Not on file  . Number of children: 3  . Years of education: Not on file  . Highest education level: Not on file  Occupational History  . 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 Financial resource strain: Not on file  . Food insecurity    Worry: Not on file    Inability: Not on file  . Transportation needs    Medical: Not on file    Non-medical: Not on file  Tobacco Use  .  Smoking status: Former Smoker    Packs/day: 3.00    Years: 25.00    Pack years: 75.00    Types: Cigarettes    Quit date: 05/28/1993    Years since quitting: 25.4  . Smokeless tobacco: Never Used  Substance and Sexual Activity  . Alcohol use: Yes    Alcohol/week: 42.0 standard drinks    Types: 42 Cans of beer per week  . Drug use: No  . Sexual activity: Not on file  Lifestyle  . Physical activity    Days per week: Not on file    Minutes per session: Not on file  . Stress: Not on file  Relationships  . Social Herbalist on phone: Not on file    Gets together: Not on file    Attends religious service: Not on file    Active member of club or organization: Not on file    Attends meetings of clubs or organizations: Not on file    Relationship status: Not on file  . Intimate partner violence    Fear of current or ex partner: Not on file    Emotionally abused: Not on file    Physically abused: Not on file    Forced sexual activity: Not on file  Other Topics Concern  . Not on file  Social History Narrative   No living will   No health care POA but requests wife--then children   Would accept resuscitation   Not sure about tube feeds   Review of Systems Weight is stable---attributes to ensure No fever Energy levels are okay---doing considerable outdoor/garden work    Objective:   Physical Exam  Neck: No thyromegaly present.  Cardiovascular: Normal rate, regular rhythm and normal heart  sounds. Exam reveals no gallop.  No murmur heard. Respiratory: Effort normal. No respiratory distress. He has no wheezes. He has no rales.  Slightly decreased breath sounds  GI: Soft. Bowel sounds are normal. There is no rebound and no guarding.  Slight bloating Minimal LLQ tenderness  Musculoskeletal:        General: No edema.     Comments: No synovitis in hands No effusion in knees or ankles  Lymphadenopathy:    He has no cervical adenopathy.  Psychiatric: He has a normal mood and affect. His behavior is normal.           Assessment & Plan:

## 2018-10-19 NOTE — Assessment & Plan Note (Signed)
Doing okay Chronic cough Stable DOE

## 2018-10-19 NOTE — Assessment & Plan Note (Signed)
Stable DOE No recent chest pain Doesn't seem to be implicated in his current symptoms

## 2018-10-19 NOTE — Assessment & Plan Note (Signed)
Likely related to his COPD and heart disease Maintains weight with ensure No arthritis or worrisome systemic symptoms

## 2018-11-03 ENCOUNTER — Telehealth: Payer: Self-pay

## 2018-11-03 NOTE — Telephone Encounter (Signed)

## 2018-11-06 ENCOUNTER — Other Ambulatory Visit: Payer: Self-pay | Admitting: Cardiovascular Disease

## 2018-11-06 ENCOUNTER — Ambulatory Visit (INDEPENDENT_AMBULATORY_CARE_PROVIDER_SITE_OTHER): Payer: Medicare Other

## 2018-11-06 ENCOUNTER — Other Ambulatory Visit: Payer: Self-pay

## 2018-11-06 DIAGNOSIS — I714 Abdominal aortic aneurysm, without rupture, unspecified: Secondary | ICD-10-CM

## 2018-11-13 ENCOUNTER — Telehealth: Payer: Self-pay

## 2018-11-13 MED ORDER — ROSUVASTATIN CALCIUM 40 MG PO TABS: 40.0000 mg | ORAL_TABLET | Freq: Every day | ORAL | 0 refills | Status: DC

## 2018-11-13 NOTE — Telephone Encounter (Signed)
Requested Prescriptions   Signed Prescriptions Disp Refills  . rosuvastatin (CRESTOR) 40 MG tablet 90 tablet 0    Sig: Take 1 tablet (40 mg total) by mouth daily.    Authorizing Provider: Minna Merritts    Ordering User: Raelene Bott, Alam Guterrez L

## 2018-11-17 ENCOUNTER — Telehealth: Payer: Self-pay | Admitting: Cardiovascular Disease

## 2018-11-17 NOTE — Telephone Encounter (Signed)
° ° °  COVID-19 Pre-Screening Questions:   In the past 7 to 10 days have you had a cough,  shortness of breath, headache, congestion, fever (100 or greater) body aches, chills, sore throat, or sudden loss of taste or sense of smell? NO   Have you been around anyone with known Covid 19. NO   Have you been around anyone who is awaiting Covid 19 test results in the past 7 to 10 days? NO   Have you been around anyone who has been exposed to Covid 19, or has mentioned symptoms of Covid 19 within the past 7 to 10 days?  * Son's work was closed down last week, someone had tested positive.  States they were not in his specific department but they shut down the whole factory.  Son has not mentioned any symptoms.  Please advise.   If you have any concerns/questions about symptoms patients report during screening (either on the phone or at [facility]). Contact the provider seeing the patient or DOD for further guidance.  If neither are available contact a member of the leadership team.

## 2018-11-17 NOTE — Telephone Encounter (Signed)
Virtual visit may be better for now

## 2018-11-17 NOTE — Telephone Encounter (Signed)
Spoke with patient. Patient's son's job had positive COVID employee. He saw his son on Saturday, July 18th. Patient's son does not report any Covid symptoms at this time. Patient's appt would be at the 10 day mark of seeing son. Patient is open to a Virtual visit if Dr Rockey Situ feels that patient should not come in person. Routing to Dr Rockey Situ to advise

## 2018-11-19 NOTE — Progress Notes (Deleted)
Cardiology Office Note  Date:  11/19/2018   ID:  [name], DOB [birth year], MRN [id number]  PCP:  Venia Carbon, MD   No chief complaint on file.   HPI:  [name] is a 68 year old gentleman with  coronary artery disease, bypass surgery in 1995,  stenting at [facility] in February 2004 for MI with a 3.0 x 25 mm and 4.5 x 16 mm Monorail ( location uncertain), also 4.5 x 18 mm stent placed to the mid RCA, followup with repeat stenting in April 2005 to the proximal LAD with a Taxus stent 2.5 x 8 mm, and stenting to the proximal left circumflex with a 2.5 x 12 mm Taxus, with  stent placed November 13, 2010 with a 4.0 x 9 mm stent placed to the left main. renal artery stent. Abdominal aortic aneurysm 3.2 cm Prior smoking history 4.1 cm AAA on CT scan 10/2017  He presents for routine followup of his coronary artery disease.  Could not breath,called EMS 100% oxygen,  Cardiac cath 10/25/2017 Details as below No stents  Known Severe Multi-vessel CAD: known CTO of ostRCA, mLAD & SVG-AM-PDA, SVG-OM.  Widely patent LIMA-LAD - retograde fills to 100% CTO, antegrade flow brisk to the Apex.  Prox LM ~40% (stable lesion just prior to the stent) followed by widely patent stents running from pLM-ost-proxCx-OM with brisk flow in both the distal OM & AV Groove Cx --> collaterals to RPL system.  Mldly reduced LVEF with basal-mid Inferior Akinesis.  Normal/Low LVEDP.   Seen by pulmonary, Started on lasix daily Feeling better Going on vacation this weekend Large region of ecchymosis forearm from catheterization site on the left  Total cholesterol 118 LDL 24  EKG personally reviewed by myself on todays visit Shows normal sinus rhythm with rate 61 bpm T-wave abnormality, no change from previous EKGs  Other past medical history reviewed   chest pain 04/05/2014, went to [facility] head catheterization. This showed an occluded OM vessel with DES placed 2 Occluded mid LAD with  a patent LIMA to the LAD, patent left circumflex, prior bare-metal stent into the OM1 was occluded in the distal portion just after the ostium of OM1. Occluded RCA, ejection fraction 35%  Previous episode of chest pain while in [place] with hospitalization at that time, negative stress test, 2013 recurrent chest pain requiring admission to [facility], repeat catheterization performed 12/2011 This showed patent stents to the left circumflex and LAD with patent LIMA to the LAD. Vein graft to the OM and vein graft to the RCA was occluded  admission to [place] at the beginning of July 2014. Notes from the hospital were reviewed. Ruled out for MI with negative cardiac enzymes, CT scan chest showing no PE. He had stress test 10/25/2012 showing old inferior wall MI with defect noted in the basal to mid inferior wall. Discharged on tramadol.   Ultrasound of his aorta in [place] 12/22/2011 showed distal aorta 3 cm, iliacs of normal size Previous lower extremity ultrasound  showed no significant disease  He used to be a smoker though stopped in 1995. Prior to that he smoked 3 packs per day. Smoked for approximately 25-28 years    PMH:   has a past medical history of AAA (abdominal aortic aneurysm) ([place]), Alcohol abuse, CAD (coronary artery disease), Cardiomyopathy ([place]), Diverticulitis, Dyspnea, GERD (gastroesophageal reflux disease), Hyperlipidemia, Hypertension, Myocardial infarction [facility]), PAD (peripheral artery disease) ([place]), and Renal artery stenosis ([place]).  PSH:    Past Surgical History:  Procedure Laterality Date  . ANGIOPLASTY  1997  . CARDIAC CATHETERIZATION     8/09  Cath--vein graft occlusions which are old--no acute changes  . CARDIAC CATHETERIZATION  11/13/2010   stent x 2 @ [place]  . CARDIAC CATHETERIZATION  04/05/2014   stent placement   . CARDIAC CATHETERIZATION  10/28/2017  . CLOSED REDUCTION SHOULDER DISLOCATION    . CORONARY ANGIOPLASTY  04/05/2014   stent placement OM  1  . CORONARY ARTERY BYPASS GRAFT    . CORONARY STENT PLACEMENT  7/12   2 stents--Promus element plus (everolimus eluting)--[facility] in [place]  . LEFT HEART CATH AND CORS/GRAFTS ANGIOGRAPHY N/A 10/28/2017   Procedure: LEFT HEART CATH AND CORS/GRAFTS ANGIOGRAPHY;  Surgeon: Leonie Man, MD;  Location: [facility];  Service: Cardiovascular;  Laterality: N/A;  . LEFT HEART CATHETERIZATION WITH CORONARY/GRAFT ANGIOGRAM N/A 01/04/2012   Procedure: LEFT HEART CATHETERIZATION WITH Beatrix Fetters;  Surgeon: Burnell Blanks, MD;  Location: [facility] CATH LAB;  Service: Cardiovascular;  Laterality: N/A;  . LEFT HEART CATHETERIZATION WITH CORONARY/GRAFT ANGIOGRAM N/A 04/05/2014   Procedure: LEFT HEART CATHETERIZATION WITH Beatrix Fetters;  Surgeon: Jettie Booze, MD;  Location: [facility] CATH LAB;  Service: Cardiovascular;  Laterality: N/A;  . PERCUTANEOUS CORONARY STENT INTERVENTION (PCI-S)  04/05/2014   Procedure: PERCUTANEOUS CORONARY STENT INTERVENTION (PCI-S);  Surgeon: Jettie Booze, MD;  Location: [facility] CATH LAB;  Service: Cardiovascular;;  OM1  . RENAL ANGIOGRAM N/A 04/16/2011   Procedure: RENAL ANGIOGRAM;  Surgeon: Burnell Blanks, MD;  Location: [facility] CATH LAB;  Service: Cardiovascular;  Laterality: N/A;  . RENAL ARTERY STENT  03/2011 ?    Current Outpatient Medications  Medication Sig Dispense Refill  . aspirin 81 MG tablet Take 81 mg by mouth daily.    . carvedilol (COREG) 3.125 MG tablet Take 1 tablet (3.125 mg total) by mouth 2 (two) times daily. 180 tablet 2  . clopidogrel (PLAVIX) 75 MG tablet Take 1 tablet (75 mg total) by mouth daily. 90 tablet 3  . ezetimibe (ZETIA) 10 MG tablet Take 1 tablet (10 mg total) by mouth daily. 90 tablet 2  . isosorbide mononitrate (IMDUR) 30 MG 24 hr tablet Take 0.5 tablets (15 mg total) by mouth at bedtime. 45 tablet 1  . lisinopril (PRINIVIL,ZESTRIL) 5 MG tablet Take 1 tablet (5 mg total) by mouth daily. 90  tablet 2  . loratadine (CLARITIN) 10 MG tablet Take 1 tablet (10 mg total) by mouth daily. 30 tablet 11  . mirtazapine (REMERON) 15 MG tablet Take 1 tablet (15 mg total) by mouth at bedtime. 90 tablet 3  . naproxen sodium (ALEVE) 220 MG tablet Take 220 mg by mouth daily as needed (pain).    . nitroGLYCERIN (NITROSTAT) 0.4 MG SL tablet Place 1 tablet (0.4 mg total) under the tongue every 5 (five) minutes as needed for chest pain. 25 tablet 2  . omeprazole (PRILOSEC) 20 MG capsule Take 20 mg by mouth daily.    . pantoprazole (PROTONIX) 40 MG tablet TAKE 1 TABLET (40 MG TOTAL) BY MOUTH DAILY AT 6 (SIX) AM. 30 tablet 3  . rosuvastatin (CRESTOR) 40 MG tablet Take 1 tablet (40 mg total) by mouth daily. 90 tablet 0   No current facility-administered medications for this visit.      Allergies:   Metoclopramide and Metoprolol tartrate   Social History:  The patient  reports that he quit smoking about 25 years ago. His smoking use included cigarettes. He has a  75.00 pack-year smoking history. He has never used smokeless tobacco. He reports current alcohol use of about 42.0 standard drinks of alcohol per week. He reports that he does not use drugs.   Family History:   family history includes Coronary artery disease in his brother, father, mother, and sister; Heart attack in his father, maternal grandfather, and mother; Hypertension in his mother.    Review of Systems: Review of Systems  Constitutional: Negative.   HENT:       Dental problems  Respiratory: Negative.   Cardiovascular: Negative.   Gastrointestinal: Negative.   Musculoskeletal: Negative.   Skin:       Bruising left forearm  Neurological: Negative.        Lightheaded  Psychiatric/Behavioral: Negative.   All other systems reviewed and are negative.    PHYSICAL EXAM: VS:  There were no vitals taken for this visit. , BMI There is no height or weight on file to calculate BMI. Constitutional:  oriented to person, place, and time.  No distress.  HENT:  Head: Normocephalic and atraumatic.  Eyes:  no discharge. No scleral icterus.  Neck: Normal range of motion. Neck supple. No JVD present.  Cardiovascular: Normal rate, regular rhythm, normal heart sounds and intact distal pulses. Exam reveals no gallop and no friction rub. No edema No murmur heard. Pulmonary/Chest: Effort normal and breath sounds normal. No stridor. No respiratory distress.  no wheezes.  no rales.  no tenderness.  Abdominal: Soft.  no distension.  no tenderness.  Musculoskeletal: Normal range of motion.  no  tenderness or deformity.  Neurological:  normal muscle tone. Coordination normal. No atrophy Skin: Skin is warm and dry. No rash noted. not diaphoretic.  Psychiatric:  normal mood and affect. behavior is normal. Thought content normal.    Recent Labs: 10/19/2018: ALT 46; BUN 9; Creatinine, Ser 0.70; Hemoglobin 12.9; Platelets 239.0; Potassium 4.4; Sodium 131    Lipid Panel Lab Results  Component Value Date   CHOL 169 10/19/2018   HDL 44.30 10/19/2018   LDLCALC 24 10/29/2017   TRIG (H) 10/19/2018    492.0 Triglyceride is over 400; calculations on Lipids are invalid.      Wt Readings from Last 3 Encounters:  10/19/18 161 lb (73 kg)  05/23/18 160 lb (72.6 kg)  02/09/18 156 lb (70.8 kg)       ASSESSMENT AND PLAN:   SOB (shortness of breath) on exertion - Plan: EKG 12-Lead History of COPD, smoking Recent symptoms of shortness of breath, etiology unclear Concerning for diastolic CHF Started on Lasix by pulmonary Recent catheterization showing stable disease On he had symptoms at rest when laying supine, ow resolved  Hyperlipidemia LDL goal <70 Cholesterol is at goal on the current lipid regimen. No changes to the medications were made. Stable. LDL 24  Essential hypertension Blood pressure is well controlled on today's visit. No changes made to the medications. Stable  Atherosclerosis of native coronary artery of native heart  with angina pectoris [facility]) Recent cardiac catheterization with stable disease  AAA (abdominal aortic aneurysm) without rupture (HCC) 3.7 cm December 2017 4.1 cm CT scan July 2019 Repeat ultrasound July 2020  Renal artery stenosis (HCC) 60% renal artery stenosis  Repeat ultrasound done next visit  Long discussion concerning recent hospitalization and cardiac catheterization results  Total encounter time more than 45 minutes  Greater than 50% was spent in counseling and coordination of care with the patient   Disposition:   F/U  12 months   No  orders of the defined types were placed in this encounter.    Signed, Esmond Plants, M.D., Ph.D. 11/19/2018  [place], [place]

## 2018-11-20 ENCOUNTER — Other Ambulatory Visit: Payer: Self-pay

## 2018-11-20 ENCOUNTER — Telehealth: Payer: Medicare Other | Admitting: Cardiovascular Disease

## 2018-11-22 ENCOUNTER — Ambulatory Visit: Payer: Medicare Other | Admitting: Gastroenterology

## 2018-12-08 ENCOUNTER — Telehealth: Payer: Self-pay | Admitting: Cardiovascular Disease

## 2018-12-08 ENCOUNTER — Other Ambulatory Visit: Payer: Self-pay

## 2018-12-08 MED ORDER — CLOPIDOGREL BISULFATE 75 MG PO TABS: 75.0000 mg | ORAL_TABLET | Freq: Every day | ORAL | 3 refills | Status: DC

## 2018-12-08 NOTE — Telephone Encounter (Signed)
°*  STAT* If patient is at the pharmacy, call can be transferred to refill team.   1. Which medications need to be refilled? (please list name of each medication and dose if known) clopidogrel 75 MG 1 tablet daily   2. Which pharmacy/location (including street and city if local pharmacy) is medication to be sent to? [facility] [place]   3. Do they need a 30 day or 90 day supply? 90 day   Will be out of medication on Sunday

## 2018-12-08 NOTE — Telephone Encounter (Signed)
clopidogrel (PLAVIX) 75 MG tablet 90 tablet 3 12/08/2018    Sig - Route: Take 1 tablet (75 mg total) by mouth daily. - Oral   Sent to pharmacy as: clopidogrel (PLAVIX) 75 MG tablet   E-Prescribing Status: Receipt confirmed by pharmacy (12/08/2018 2:59 PM EDT)   Pharmacy  [id number] - [place], [place]

## 2018-12-11 NOTE — Progress Notes (Signed)
Virtual Visit via Video Note   This visit type was conducted due to national recommendations for restrictions regarding the COVID-19 Pandemic (e.g. social distancing) in an effort to limit this patient's exposure and mitigate transmission in our community.  Due to his co-morbid illnesses, this patient is at least at moderate risk for complications without adequate follow up.  This format is felt to be most appropriate for this patient at this time.  All issues noted in this document were discussed and addressed.  A limited physical exam was performed with this format.  Please refer to the patient's chart for his consent to telehealth for [facility].   I connected with  [name] on 12/12/18 by a video enabled telemedicine application and verified that I am speaking with the correct person using two identifiers. I discussed the limitations of evaluation and management by telemedicine. The patient expressed understanding and agreed to proceed.   Evaluation Performed:  Follow-up visit  Date:  12/12/2018   ID:  [name], DOB [birth year], MRN [id number]  Patient Location:  [address] [place] [address]   Provider location:   [facility], [place] office  PCP:  [name] Carbon, MD  Cardiologist:  Arvid Right [facility]   Chief Complaint:  CAD, stable angina   History of Present Illness:    [name] is a 68 y.o. male who presents via audio/video conferencing for a telehealth visit today.   The patient does not symptoms concerning for COVID-19 infection (fever, chills, cough, or new SHORTNESS OF BREATH).   Patient has a past medical history of coronary artery disease, bypass surgery in 1995,  stenting at [facility] in February 2004 for MI with a 3.0 x 25 mm and 4.5 x 16 mm Monorail ( location uncertain), also 4.5 x 18 mm stent placed to the mid RCA, followup with repeat stenting in April 2005 to the proximal LAD with a Taxus stent  2.5 x 8 mm, and stenting to the proximal left circumflex with a 2.5 x 12 mm Taxus, with  stent placed November 13, 2010 with a 4.0 x 9 mm stent placed to the left main. renal artery stent. Abdominal aortic aneurysm 3.2 cm smoker though stopped in 1995. Prior to that he smoked 3 packs per day. Smoked for approximately 25-28 years 4.1 cm AAA on CT scan 10/2017  He presents for routine followup of his coronary artery disease.  Not getting out much, Sees Dr. Vicente Males, "don't eat nearly enough" Weight stable Get full Can't sit down eat a meal , full after few mouthfuls of potatoes Getting worse "don't how I am alive or weight stable" Not due to teeth, he has had dental work done  No chest pain, no SOB Walking [place] his farm[place], out, active Planted a garden Does not use lasix  Aorta u/s 10/2018 Stable size, 4.2 cm  Unchanged, discussed   Other past medical hx reviewed Could not breath,called EMS 100% oxygen,  Cardiac cath 10/25/2017 No stents  Known Severe Multi-vessel CAD: known CTO of ostRCA, mLAD & SVG-AM-PDA, SVG-OM.  Widely patent LIMA-LAD - retograde fills to 100% CTO, antegrade flow brisk to the Apex.  Prox LM ~40% (stable lesion just prior to the stent) followed by widely patent stents running from pLM-ost-proxCx-OM with brisk flow in both the distal OM & AV Groove Cx --> collaterals to RPL system.  Mldly reduced LVEF with basal-mid Inferior Akinesis.  Normal/Low LVEDP.  Seen by pulmonary, Started  on lasix daily, Felt better   chest pain 04/05/2014, went to [facility] head catheterization. This showed an occluded OM vessel with DES placed 2 Occluded mid LAD with a patent LIMA to the LAD, patent left circumflex, prior bare-metal stent into the OM1 was occluded in the distal portion just after the ostium of OM1. Occluded RCA, ejection fraction 35%  Previous episode of chest pain while in [place] with hospitalization at that time, negative stress test, 2013 recurrent chest  pain requiring admission to [facility], repeat catheterization performed 12/2011 This showed patent stents to the left circumflex and LAD with patent LIMA to the LAD. Vein graft to the OM and vein graft to the RCA was occluded  admission to [facility] at the beginning of July 2014. Notes from the hospital were reviewed. Ruled out for MI with negative cardiac enzymes, CT scan chest showing no PE. He had stress test 10/25/2012 showing old inferior wall MI with defect noted in the basal to mid inferior wall. Discharged on tramadol.   Ultrasound of his aorta in [place] 12/22/2011 showed distal aorta 3 cm, iliacs of normal size Previous lower extremity ultrasound  showed no significant disease  He used to be a smoker though stopped in 1995. Prior to that he smoked 3 packs per day. Smoked for approximately 25-28 years    Prior CV studies:   The following studies were reviewed today:    Past Medical History:  Diagnosis Date  . AAA (abdominal aortic aneurysm) ([place])    a. Duplex 01/2012: stable infrarenal saccular AAA at 3.3cm x 3.2xm (f/u recommended 01/2013 per Dr. Rockey Situ)  . Alcohol abuse   . CAD (coronary artery disease)    a. 1995 s/p CABG x 4: LIMA->LAD, VG->RI (known to be occluded), VG->AM->PDA;  b. 05/2002 Inf STEMI: VG->AM->PDA 100% treated w/ 2 BMS complicated by acute thrombosis req 3 BMS;  c. 07/2003 DES to  LAD & LCX (VG's to PDA & RI 100%);  d. 12/2010 [place] ([place] DES to LCX & LM , LIMA ok,;  e.12/2011 Cath: LM/LCX stents ok , LIMA patent.  . Cardiomyopathy ([place])    a. EF 35% by cath 2015.  . Diverticulitis    1/06 Diverticulitis--CT of pelvis--diffuse sigmoid divertic  . Dyspnea   . GERD (gastroesophageal reflux disease)   . Hyperlipidemia   . Hypertension   . Myocardial infarction ([place])   . PAD (peripheral artery disease) (HCC)    a. external iliac and mesenteric stenosis noted by noninvasive imaging.  . Renal artery stenosis ([place])    a. 03/2011 PTA and stenting of L RA.  b. last duplex 2016 with stable 1-59% bilateral RAS, incidental >50% R EIA stenosis   Past Surgical History:  Procedure Laterality Date  . ANGIOPLASTY  1997  . CARDIAC CATHETERIZATION     8/09  Cath--vein graft occlusions which are old--no acute changes  . CARDIAC CATHETERIZATION  11/13/2010   stent x 2 @ [place]  . CARDIAC CATHETERIZATION  04/05/2014   stent placement   . CARDIAC CATHETERIZATION  10/28/2017  . CLOSED REDUCTION SHOULDER DISLOCATION    . CORONARY ANGIOPLASTY  04/05/2014   stent placement OM 1  . CORONARY ARTERY BYPASS GRAFT    . CORONARY STENT PLACEMENT  7/12   2 stents--Promus element plus (everolimus eluting)--[facility] in [place]  . LEFT HEART CATH AND CORS/GRAFTS ANGIOGRAPHY N/A 10/28/2017   Procedure: LEFT HEART CATH AND CORS/GRAFTS ANGIOGRAPHY;  Surgeon: Leonie Man, MD;  Location: [facility]  CV LAB;  Service: Cardiovascular;  Laterality: N/A;  . LEFT HEART CATHETERIZATION WITH CORONARY/GRAFT ANGIOGRAM N/A 01/04/2012   Procedure: LEFT HEART CATHETERIZATION WITH Beatrix Fetters;  Surgeon: Burnell Blanks, MD;  Location: [facility] CATH LAB;  Service: Cardiovascular;  Laterality: N/A;  . LEFT HEART CATHETERIZATION WITH CORONARY/GRAFT ANGIOGRAM N/A 04/05/2014   Procedure: LEFT HEART CATHETERIZATION WITH Beatrix Fetters;  Surgeon: Jettie Booze, MD;  Location: [facility] CATH LAB;  Service: Cardiovascular;  Laterality: N/A;  . PERCUTANEOUS CORONARY STENT INTERVENTION (PCI-S)  04/05/2014   Procedure: PERCUTANEOUS CORONARY STENT INTERVENTION (PCI-S);  Surgeon: Jettie Booze, MD;  Location: [facility] CATH LAB;  Service: Cardiovascular;;  OM1  . RENAL ANGIOGRAM N/A 04/16/2011   Procedure: RENAL ANGIOGRAM;  Surgeon: Burnell Blanks, MD;  Location: [facility] CATH LAB;  Service: Cardiovascular;  Laterality: N/A;  . RENAL ARTERY STENT  03/2011 ?     Allergies:   Metoclopramide and Metoprolol tartrate   Social History   Tobacco Use  . Smoking  status: Former Smoker    Packs/day: 3.00    Years: 25.00    Pack years: 75.00    Types: Cigarettes    Quit date: 05/28/1993    Years since quitting: 25.5  . Smokeless tobacco: Never Used  Substance Use Topics  . Alcohol use: Yes    Alcohol/week: 42.0 standard drinks    Types: 42 Cans of beer per week  . Drug use: No     Current Outpatient Medications on File Prior to Visit  Medication Sig Dispense Refill  . aspirin 81 MG tablet Take 81 mg by mouth daily.    . carvedilol (COREG) 3.125 MG tablet Take 1 tablet (3.125 mg total) by mouth 2 (two) times daily. 180 tablet 2  . clopidogrel (PLAVIX) 75 MG tablet Take 1 tablet (75 mg total) by mouth daily. 90 tablet 3  . ezetimibe (ZETIA) 10 MG tablet Take 1 tablet (10 mg total) by mouth daily. 90 tablet 2  . isosorbide mononitrate (IMDUR) 30 MG 24 hr tablet Take 0.5 tablets (15 mg total) by mouth at bedtime. 45 tablet 1  . lisinopril (PRINIVIL,ZESTRIL) 5 MG tablet Take 1 tablet (5 mg total) by mouth daily. 90 tablet 2  . loratadine (CLARITIN) 10 MG tablet Take 1 tablet (10 mg total) by mouth daily. 30 tablet 11  . mirtazapine (REMERON) 15 MG tablet Take 1 tablet (15 mg total) by mouth at bedtime. 90 tablet 3  . naproxen sodium (ALEVE) 220 MG tablet Take 220 mg by mouth daily as needed (pain).    . nitroGLYCERIN (NITROSTAT) 0.4 MG SL tablet Place 1 tablet (0.4 mg total) under the tongue every 5 (five) minutes as needed for chest pain. 25 tablet 2  . omeprazole (PRILOSEC) 20 MG capsule Take 20 mg by mouth daily.    . pantoprazole (PROTONIX) 40 MG tablet TAKE 1 TABLET (40 MG TOTAL) BY MOUTH DAILY AT 6 (SIX) AM. 30 tablet 3  . rosuvastatin (CRESTOR) 40 MG tablet Take 1 tablet (40 mg total) by mouth daily. 90 tablet 0   No current facility-administered medications on file prior to visit.      Family Hx: The patient's family history includes Coronary artery disease in his brother, father, mother, and sister; Heart attack in his father, maternal  grandfather, and mother; Hypertension in his mother. There is no history of Diabetes or Cancer.  ROS:   Please see the history of present illness.    Review of Systems  Constitutional: Negative.  HENT: Negative.   Respiratory: Negative.   Cardiovascular: Negative.   Gastrointestinal: Negative.   Musculoskeletal: Negative.   Neurological: Negative.   Psychiatric/Behavioral: Negative.   All other systems reviewed and are negative.    Labs/Other Tests and Data Reviewed:    Recent Labs: 10/19/2018: ALT 46; BUN 9; Creatinine, Ser 0.70; Hemoglobin 12.9; Platelets 239.0; Potassium 4.4; Sodium 131   Recent Lipid Panel Lab Results  Component Value Date/Time   CHOL 169 10/19/2018 02:05 PM   CHOL 163 08/11/2015 09:41 AM   TRIG (H) 10/19/2018 02:05 PM    492.0 Triglyceride is over 400; calculations on Lipids are invalid.   HDL 44.30 10/19/2018 02:05 PM   HDL 46 08/11/2015 09:41 AM   CHOLHDL 4 10/19/2018 02:05 PM   LDLCALC 24 10/29/2017 02:33 AM   LDLCALC 63 01/07/2017 11:20 AM   LDLDIRECT 62.0 10/19/2018 02:05 PM    Wt Readings from Last 3 Encounters:  12/12/18 154 lb (69.9 kg)  10/19/18 161 lb (73 kg)  05/23/18 160 lb (72.6 kg)     Exam:    Vital Signs: Vital signs may also be detailed in the HPI BP 125/89 (BP Location: Left Arm, Patient Position: Sitting, Cuff Size: Normal)   Pulse 64   Ht 5\' 5"  (1.651 m)   Wt 154 lb (69.9 kg)   SpO2 97%   BMI 25.63 kg/m   Wt Readings from Last 3 Encounters:  12/12/18 154 lb (69.9 kg)  10/19/18 161 lb (73 kg)  05/23/18 160 lb (72.6 kg)   Temp Readings from Last 3 Encounters:  10/19/18 98 F (36.7 C) (Oral)  05/23/18 97.9 F (36.6 C)  02/09/18 (!) 97.2 F (36.2 C) (Oral)   BP Readings from Last 3 Encounters:  12/12/18 125/89  10/19/18 120/80  05/23/18 114/72   Pulse Readings from Last 3 Encounters:  12/12/18 64  10/19/18 69  05/23/18 60    125/89, pulse 60s, resp 16  Well nourished, well developed male in no acute  distress. Constitutional:  oriented to person, place, and time. No distress.  Head: Normocephalic and atraumatic.  Eyes:  no discharge. No scleral icterus.  Neck: Normal range of motion. Neck supple.  Pulmonary/Chest: No audible wheezing, no distress, appears comfortable Musculoskeletal: Normal range of motion.  no  tenderness or deformity.  Neurological:   Coordination normal. Full exam not performed Skin:  No rash Psychiatric:  normal mood and affect. behavior is normal. Thought content normal.    ASSESSMENT & PLAN:    Problem List Items Addressed This Visit      Cardiology Problems   Coronary artery disease involving native coronary artery of native heart with angina pectoris ([place]) - Primary   Renal artery stenosis (HCC)   Orthostasis   Essential hypertension   AAA (abdominal aortic aneurysm) without rupture (HCC)   Hyperlipidemia LDL goal <70      CAD Currently with no symptoms of angina. No further workup at this time. Continue current medication regimen.  AAA Results discussed with him, stable 4.2 cm  repeat ultrasound in 1 year  ETOH Moderation preferably cessation suggested Discussed with him  Essential hypertension Continue current medications, acceptable range at this time  Hyperlipidemia Discussed role of Zetia, stressed importance of taking both Crestor and Zetia faithfully Goal LDL less than 70 preferably <60  COVID-19 Education: The signs and symptoms of COVID-19 were discussed with the patient and how to seek care for testing (follow up with PCP or arrange E-visit).  The importance of  social distancing was discussed today.  Patient Risk:   After full review of this patients clinical status, I feel that they are at least moderate risk at this time.  Time:   Today, I have spent 25 minutes with the patient with telehealth technology discussing the cardiac and medical problems/diagnoses detailed above   Additional 10 min spent reviewing the chart prior to  patient visit today   Medication Adjustments/Labs and Tests Ordered: Current medicines are reviewed at length with the patient today.  Concerns regarding medicines are outlined above.   Tests Ordered: No tests ordered   Medication Changes: No changes made   Disposition: Follow-up in 12 months   Signed, Ida Rogue, MD  [place] Office [address] [place] [address], [place], [place] [address]

## 2018-12-12 ENCOUNTER — Ambulatory Visit (INDEPENDENT_AMBULATORY_CARE_PROVIDER_SITE_OTHER): Payer: Medicare Other | Admitting: Gastroenterology

## 2018-12-12 ENCOUNTER — Telehealth (INDEPENDENT_AMBULATORY_CARE_PROVIDER_SITE_OTHER): Payer: Medicare Other | Admitting: Cardiovascular Disease

## 2018-12-12 ENCOUNTER — Other Ambulatory Visit: Payer: Self-pay

## 2018-12-12 ENCOUNTER — Encounter: Payer: Self-pay | Admitting: Cardiovascular Disease

## 2018-12-12 VITALS — BP 185/105 | HR 67 | Temp 97.8°F | Ht 63.75 in | Wt 160.6 lb

## 2018-12-12 VITALS — BP 125/89 | HR 64 | Ht 65.0 in | Wt 154.0 lb

## 2018-12-12 DIAGNOSIS — I25119 Atherosclerotic heart disease of native coronary artery with unspecified angina pectoris: Secondary | ICD-10-CM | POA: Diagnosis not present

## 2018-12-12 DIAGNOSIS — I951 Orthostatic hypotension: Secondary | ICD-10-CM

## 2018-12-12 DIAGNOSIS — I701 Atherosclerosis of renal artery: Secondary | ICD-10-CM

## 2018-12-12 DIAGNOSIS — I714 Abdominal aortic aneurysm, without rupture, unspecified: Secondary | ICD-10-CM

## 2018-12-12 DIAGNOSIS — Z951 Presence of aortocoronary bypass graft: Secondary | ICD-10-CM | POA: Diagnosis not present

## 2018-12-12 DIAGNOSIS — R197 Diarrhea, unspecified: Secondary | ICD-10-CM

## 2018-12-12 DIAGNOSIS — E785 Hyperlipidemia, unspecified: Secondary | ICD-10-CM

## 2018-12-12 DIAGNOSIS — I10 Essential (primary) hypertension: Secondary | ICD-10-CM | POA: Diagnosis not present

## 2018-12-12 NOTE — Progress Notes (Signed)
Jonathon Bellows MD, MRCP(U.K) [address]  [place]  [place], [place] [address]  Main: [phone]  Fax: [phone]   Gastroenterology Consultation  Referring Provider:     Venia Carbon, MD Primary Care Physician:  Venia Carbon, MD Primary Gastroenterologist:  Dr. Jonathon Bellows  Reason for Consultation:     Rectal urgency         HPI:   [name] is a 68 y.o. y/o male referred for consultation & management  by Dr. Silvio Pate, Theophilus Kinds, MD.    He says he has been having 8 bowel movements a day for the past few months.  Denies any blood mixed with the stool but occasionally sees some blood on the toilet paper he attributes that to hemorrhoids.  He consumes 5 glasses of sweet tea a day.  Denies any other artificial sugars in his diet.  Denies any weight loss.  His appetite has been poor for 2 years.  Denies any abdominal pain.  Ex-smoker quit at the age of 77 after he had a heart attack.  Never had a colonoscopy.  No for colon cancer or polyps in the family.   Past Medical History:  Diagnosis Date  . AAA (abdominal aortic aneurysm) ([place])    a. Duplex 01/2012: stable infrarenal saccular AAA at 3.3cm x 3.2xm (f/u recommended 01/2013 per Dr. Rockey Situ)  . Alcohol abuse   . CAD (coronary artery disease)    a. 1995 s/p CABG x 4: LIMA->LAD, VG->RI (known to be occluded), VG->AM->PDA;  b. 05/2002 Inf STEMI: VG->AM->PDA 100% treated w/ 2 BMS complicated by acute thrombosis req 3 BMS;  c. 07/2003 DES to  LAD & LCX (VG's to PDA & RI 100%);  d. 12/2010 [place] ([place] DES to LCX & LM , LIMA ok,;  e.12/2011 Cath: LM/LCX stents ok , LIMA patent.  . Cardiomyopathy ([place])    a. EF 35% by cath 2015.  . Diverticulitis    1/06 Diverticulitis--CT of pelvis--diffuse sigmoid divertic  . Dyspnea   . GERD (gastroesophageal reflux disease)   . Hyperlipidemia   . Hypertension   . Myocardial infarction ([place])   . PAD (peripheral artery disease) (HCC)    a. external iliac and mesenteric stenosis  noted by noninvasive imaging.  . Renal artery stenosis ([place])    a. 03/2011 PTA and stenting of L RA. b. last duplex 2016 with stable 1-59% bilateral RAS, incidental >50% R EIA stenosis    Past Surgical History:  Procedure Laterality Date  . ANGIOPLASTY  1997  . CARDIAC CATHETERIZATION     8/09  Cath--vein graft occlusions which are old--no acute changes  . CARDIAC CATHETERIZATION  11/13/2010   stent x 2 @ [place]  . CARDIAC CATHETERIZATION  04/05/2014   stent placement   . CARDIAC CATHETERIZATION  10/28/2017  . CLOSED REDUCTION SHOULDER DISLOCATION    . CORONARY ANGIOPLASTY  04/05/2014   stent placement OM 1  . CORONARY ARTERY BYPASS GRAFT    . CORONARY STENT PLACEMENT  7/12   2 stents--Promus element plus (everolimus eluting)--[facility] in [place]  . LEFT HEART CATH AND CORS/GRAFTS ANGIOGRAPHY N/A 10/28/2017   Procedure: LEFT HEART CATH AND CORS/GRAFTS ANGIOGRAPHY;  Surgeon: Leonie Man, MD;  Location: [place] [facility] CV LAB;  Service: Cardiovascular;  Laterality: N/A;  . LEFT HEART CATHETERIZATION WITH CORONARY/GRAFT ANGIOGRAM N/A 01/04/2012   Procedure: LEFT HEART CATHETERIZATION WITH Beatrix Fetters;  Surgeon: Burnell Blanks, MD;  Location: [facility] CATH LAB;  Service: Cardiovascular;  Laterality: N/A;  . LEFT HEART CATHETERIZATION WITH CORONARY/GRAFT ANGIOGRAM N/A 04/05/2014   Procedure: LEFT HEART CATHETERIZATION WITH Beatrix Fetters;  Surgeon: Jettie Booze, MD;  Location: [facility] CATH LAB;  Service: Cardiovascular;  Laterality: N/A;  . PERCUTANEOUS CORONARY STENT INTERVENTION (PCI-S)  04/05/2014   Procedure: PERCUTANEOUS CORONARY STENT INTERVENTION (PCI-S);  Surgeon: Jettie Booze, MD;  Location: [facility] CATH LAB;  Service: Cardiovascular;;  OM1  . RENAL ANGIOGRAM N/A 04/16/2011   Procedure: RENAL ANGIOGRAM;  Surgeon: Burnell Blanks, MD;  Location: [facility] CATH LAB;  Service: Cardiovascular;  Laterality: N/A;  . RENAL ARTERY STENT  03/2011 ?     Prior to Admission medications   Medication Sig Start Date End Date Taking? Authorizing Provider  aspirin 81 MG tablet Take 81 mg by mouth daily.    [provider]  carvedilol (COREG) 3.125 MG tablet Take 1 tablet (3.125 mg total) by mouth 2 (two) times daily. 05/01/18   Minna Merritts, MD  clopidogrel (PLAVIX) 75 MG tablet Take 1 tablet (75 mg total) by mouth daily. 12/08/18   Minna Merritts, MD  ezetimibe (ZETIA) 10 MG tablet Take 1 tablet (10 mg total) by mouth daily. 10/05/18   Minna Merritts, MD  isosorbide mononitrate (IMDUR) 30 MG 24 hr tablet Take 0.5 tablets (15 mg total) by mouth at bedtime. 08/03/18   Minna Merritts, MD  lisinopril (PRINIVIL,ZESTRIL) 5 MG tablet Take 1 tablet (5 mg total) by mouth daily. 05/01/18   Minna Merritts, MD  loratadine (CLARITIN) 10 MG tablet Take 1 tablet (10 mg total) by mouth daily. 05/23/18   Lesleigh Noe, MD  mirtazapine (REMERON) 15 MG tablet Take 1 tablet (15 mg total) by mouth at bedtime. 07/13/18   Venia Carbon, MD  naproxen sodium (ALEVE) 220 MG tablet Take 220 mg by mouth daily as needed (pain).    [provider]  nitroGLYCERIN (NITROSTAT) 0.4 MG SL tablet Place 1 tablet (0.4 mg total) under the tongue every 5 (five) minutes as needed for chest pain. 06/01/17   Minna Merritts, MD  omeprazole (PRILOSEC) 20 MG capsule Take 20 mg by mouth daily.    [provider]  pantoprazole (PROTONIX) 40 MG tablet TAKE 1 TABLET (40 MG TOTAL) BY MOUTH DAILY AT 6 (SIX) AM. 11/23/17   Minna Merritts, MD  rosuvastatin (CRESTOR) 40 MG tablet Take 1 tablet (40 mg total) by mouth daily. 11/13/18   Minna Merritts, MD    Family History  Problem Relation Age of Onset  . Coronary artery disease Mother        Died MI age 46  . Hypertension Mother   . Heart attack Mother   . Coronary artery disease Sister        Living  . Coronary artery disease Brother        Living  . Coronary artery disease Father        Died MI age  45  . Heart attack Father   . Heart attack Maternal Grandfather   . Diabetes Neg Hx   . Cancer Neg Hx        prostate or colon     Social History   Tobacco Use  . Smoking status: Former Smoker    Packs/day: 3.00    Years: 25.00    Pack years: 75.00    Types: Cigarettes    Quit date: 05/28/1993    Years since quitting: 25.5  . Smokeless tobacco: Never Used  Substance Use Topics  . Alcohol use: Yes    Alcohol/week: 42.0 standard drinks    Types: 42 Cans of beer per week  . Drug use: No    Allergies as of 12/12/2018 - Review Complete 12/12/2018  Allergen Reaction Noted  . Metoclopramide    . Metoprolol tartrate Hives and Itching 12/21/2007    Review of Systems:    All systems reviewed and negative except where noted in HPI.   Physical Exam:  There were no vitals taken for this visit. No LMP for male patient. Psych:  Alert and cooperative. Normal mood and affect. General:   Alert,  Well-developed, well-nourished, pleasant and cooperative in NAD Head:  Normocephalic and atraumatic. Eyes:  Sclera clear, no icterus.   Conjunctiva pink. Ears:  Normal auditory acuity. Nose:  No deformity, discharge, or lesions. Mouth:  No deformity or lesions,oropharynx pink & moist. Neck:  Supple; no masses or thyromegaly. Lungs:  Respirations even and unlabored.  Clear throughout to auscultation.   No wheezes, crackles, or rhonchi. No acute distress. Heart:  Regular rate and rhythm; no murmurs, clicks, rubs, or gallops. Abdomen:  Normal bowel sounds.  No bruits.  Soft, non-tender and non-distended without masses, hepatosplenomegaly or hernias noted.  No guarding or rebound tenderness.    Neurologic:  Alert and oriented x3;  grossly normal neurologically. Skin:  Intact without significant lesions or rashes. No jaundice. Lymph Nodes:  No significant cervical adenopathy. Psych:  Alert and cooperative. Normal mood and affect.  Imaging Studies: No results found.  Assessment and Plan:    [name] is a 68 y.o. y/o male has been referred for a longstanding history of diarrhea chronic.  It is possible that consuming 5 glasses of sweet tea a day may lead to an osmotic diarrhea.  I have suggested him to stop drinking sweet teas and drinks and rather consume water.  In addition I believe that he should be evaluated with a colonoscopy in view of his age and history of smoking.  I will also check a stool to rule out infection with stool for GI PCR and C. difficile  I have discussed alternative options, risks & benefits,  which include, but are not limited to, bleeding, infection, perforation,respiratory complication & drug reaction.  The patient agrees with this plan & written consent will be obtained.     Follow up in 6 weeks  Dr Jonathon Bellows MD,MRCP(U.K)

## 2018-12-12 NOTE — Patient Instructions (Addendum)
Medication Instructions:  No changes  If you need a refill on your cardiac medications before your next appointment, please call your pharmacy.    Lab work: No new labs needed   If you have labs (blood work) drawn today and your tests are completely normal, you will receive your results only by: Marland Kitchen MyChart Message (if you have MyChart) OR . A paper copy in the mail If you have any lab test that is abnormal or we need to change your treatment, we will call you to review the results.   Testing/Procedures: AAA u/s in one year, for AAA Your physician has requested that you have an abdominal aorta duplex. During this test, an ultrasound is used to evaluate the aorta. Allow 30 minutes for this exam. Do not eat after midnight the day before and avoid carbonated beverages  o No food after midnight the day before  o Avoid carbonated beverages, chewing gum, smoking  o Take normal medications with a small amount of water  o Take two Extra-Strength Gas-X capsules at bedtime the night before test. Take an additional two Extra-Strength Gas-X capsules three (3) hours before the test or first thing in the morning.   Follow-Up: At [facility], you and your health needs are our priority.  As part of our continuing mission to provide you with exceptional heart care, we have created designated Provider Care Teams.  These Care Teams include your primary Cardiologist (physician) and Advanced Practice Providers (APPs -  Physician Assistants and Nurse Practitioners) who all work together to provide you with the care you need, when you need it.  . You will need a follow up appointment in 12 months .   Please call our office 2 months in advance to schedule this appointment.    . Providers on your designated Care Team:   . Murray Hodgkins, NP . Christell Faith, PA-C . Marrianne Mood, PA-C  Any Other Special Instructions Will Be Listed Below (If Applicable).  For educational health videos Log in to :  [email or web address] Or : [email or web address], password : [email or web address]   Abdominal Aortic Aneurysm  An aneurysm is a bulge in one of the blood vessels that carry blood away from the heart (artery). It happens when blood pushes up against a weak or damaged place in the wall of an artery. An abdominal aortic aneurysm happens in the main artery of the body (aorta). Some aneurysms may not cause problems. If it grows, it can burst or tear, causing bleeding inside the body. This is an emergency. It needs to be treated right away. What are the causes? The exact cause of this condition is not known. What increases the risk? The following may make you more likely to get this condition:  Being a male who is 49 years of age or older.  Being white (Caucasian).  Using tobacco.  Having a family history of aneurysms.  Having the following conditions: ? Hardening of the arteries (arteriosclerosis). ? Inflammation of the walls of an artery (arteritis). ? Certain genetic conditions. ? Being very overweight (obesity). ? An infection in the wall of the aorta (infectious aortitis). ? High cholesterol. ? High blood pressure (hypertension). What are the signs or symptoms? Symptoms depend on the size of the aneurysm and how fast it is growing. Most grow slowly and do not cause any symptoms. If symptoms do occur, they may include:  Pain in the belly (abdomen), side, or back.  Feeling full after eating only small amounts of food.  Feeling  a throbbing lump in the belly. Symptoms that the aneurysm has burst (ruptured) include:  Sudden, very bad pain in the belly, side, or back.  Feeling sick to your stomach (nauseous).  Throwing up (vomiting).  Feeling light-headed or passing out. How is this treated? Treatment for this condition depends on:  The size of the aneurysm.  How fast it is growing.  Your age.  Your risk of having it burst. If your aneurysm is smaller than 2 inches (5 cm), your doctor may manage  it by:  Checking it often to see if it is getting bigger. You may have an imaging test (ultrasound) to check it every 3-6 months, every year, or every few years.  Giving you medicines to: ? Control blood pressure. ? Treat pain. ? Fight infection. If your aneurysm is larger than 2 inches (5 cm), you may need surgery to fix it. Follow these instructions at home: Lifestyle  Do not use any products that have nicotine or tobacco in them. This includes cigarettes, e-cigarettes, and chewing tobacco. If you need help quitting, ask your doctor.  Get regular exercise. Ask your doctor what types of exercise are best for you. Eating and drinking  Eat a heart-healthy diet. This includes eating plenty of: ? Fresh fruits and vegetables. ? Whole grains. ? Low-fat (lean) protein. ? Low-fat dairy products.  Avoid foods that are high in saturated fat and cholesterol. These foods include red meat and some dairy products.  Do not drink alcohol if: ? Your doctor tells you not to drink. ? You are pregnant, may be pregnant, or are planning to become pregnant.  If you drink alcohol: ? Limit how much you use to:  0-1 drink a day for women.  0-2 drinks a day for men. ? Be aware of how much alcohol is in your drink. In the [place] one drink equals any of these:  One typical bottle of beer (12 oz).  One-half glass of wine (5 oz).  One shot of hard liquor (1 oz). General instructions  Take over-the-counter and prescription medicines only as told by your doctor.  Keep your blood pressure within normal limits. Ask your doctor what your blood pressure should be.  Have your blood sugar (glucose) level and cholesterol levels checked regularly. Keep your blood sugar level and cholesterol levels within normal limits.  Avoid heavy lifting and activities that take a lot of effort. Ask your doctor what activities are safe for you.  Keep all follow-up visits as told by your doctor. This is  important. ? Talk to your doctor about regular screenings to see if the aneurysm is getting bigger. Contact a doctor if you:  Have pain in your belly, side, or back.  Have a throbbing feeling in your belly.  Have a family history of aneurysms. Get help right away if you:  Have sudden, bad pain in your belly, side, or back.  Feel sick to your stomach.  Throw up.  Have trouble pooping (constipation).  Have trouble peeing (urinating).  Feel light-headed.  Have a fast heart rate when you stand.  Have sweaty skin that is cold to the touch (clammy).  Have shortness of breath.  Have a fever. These symptoms may be an emergency. Do not wait to see if the symptoms will go away. Get medical help right away. Call your local emergency services (911 in the [place] Do not drive yourself to the hospital. Summary  An aneurysm is a bulge in one of the blood vessels  that carry blood away from the heart (artery). Some aneurysms may not cause problems.  You may need to have yours checked often. If it grows, it can burst or tear. This causes bleeding inside the body. It needs to be treated right away.  Follow instructions from your doctor about healthy lifestyle changes.  Keep all follow-up visits as told by your doctor. This is important. This information is not intended to replace advice given to you by your health care provider. Make sure you discuss any questions you have with your health care provider. Document Released: 08/07/2012 Document Revised: 07/31/2018 Document Reviewed: 11/19/2017 Elsevier Patient Education  2020 [employer].

## 2018-12-13 ENCOUNTER — Telehealth: Payer: Self-pay

## 2018-12-13 NOTE — Telephone Encounter (Signed)
   [facility] Pre-operative Risk Assessment    Request for surgical clearance:  1. What type of surgery is being performed? Colonoscopy   2. When is this surgery scheduled? TBD (last week of August)  3. What type of clearance is required (medical clearance vs. Pharmacy clearance to hold med vs. Both)? Both  4. Are there any medications that need to be held prior to surgery and how long? Plavix 75 mg, 5 days   5. Practice name and name of physician performing surgery? [facility],  Dr. Jonathon Bellows   6. What is your office phone number [phone]    7.   What is your office fax number [phone]  8.   Anesthesia type (None, local, MAC, general) ? General   [name] 12/13/2018, 4:51 PM  _________________________________________________________________   (provider comments below)

## 2018-12-14 DIAGNOSIS — R197 Diarrhea, unspecified: Secondary | ICD-10-CM | POA: Diagnosis not present

## 2018-12-14 NOTE — Telephone Encounter (Signed)
Acceptable risk for colonoscopy no further testing needed Hold Plavix 5 days prior to procedure  Restart Plavix following procedure Continue aspirin, no hold needed

## 2018-12-15 NOTE — Telephone Encounter (Signed)
Spoke to pt who stated he was not informed that he needed to hold his Plavix yesterday. He stated he did take it yesterday, but will start holding today. Informed pt that it does not look like Colonoscopy has been scheduled yet, so it should be fine. Informed pt that I will call [facility] to see if they have a scheduled date for him yet and will call him back after to let him know. Pt verbalized thanks.  Called [facility] and left message that pt will need to old his Plavix for 5 days prior to Colonoscopy and asked that they call back to let us know when procedure will be done. Informed that pt did not start holding Plavix 8/20, but will start holding today.

## 2018-12-15 NOTE — Telephone Encounter (Signed)
   Primary Cardiologist: Ida Rogue, MD  Chart reviewed as part of pre-operative protocol coverage. Patient was seen by Dr. Rockey Situ via a telemedicine visit 12/12/2018. Per Dr. Rockey Situ, patient is at acceptable risk for colonoscopy without further cardiac testing. He can hold plavix 5 days prior to his colonoscopy but should remain on aspirin. Patient should restart plavix as soon as he is cleared to do so by his gastroenterologist.   I will route this recommendation to the requesting party via [facility] fax function and remove from pre-op pool.  Please call with questions.  Abigail Butts, PA-C 12/15/2018, 7:31 AM

## 2018-12-18 ENCOUNTER — Telehealth: Payer: Self-pay | Admitting: Gastroenterology

## 2018-12-18 ENCOUNTER — Other Ambulatory Visit: Payer: Self-pay

## 2018-12-18 DIAGNOSIS — R197 Diarrhea, unspecified: Secondary | ICD-10-CM

## 2018-12-18 MED ORDER — NA SULFATE-K SULFATE-MG SULF 17.5-3.13-1.6 GM/177ML PO SOLN: 1.0000 | Freq: Once | ORAL | 0 refills | Status: AC

## 2018-12-18 NOTE — Telephone Encounter (Signed)
[name] from [facility] left vm regarding needing Clearance on  his rx Plavix  She states she will have the pt hold the Plavix today and she would like to go ahead and get pt scheduled for procedure please call [phone]

## 2018-12-18 NOTE — Telephone Encounter (Signed)
Spoke with pt to schedule procedure, pt states he stopped the Plavix last weekend we have schedule pt procedure on 12-22-18.

## 2018-12-19 ENCOUNTER — Other Ambulatory Visit: Payer: Self-pay

## 2018-12-19 ENCOUNTER — Other Ambulatory Visit
Admission: RE | Admit: 2018-12-19 | Discharge: 2018-12-19 | Disposition: A | Discharge status: Home / Self Care | Payer: Medicare Other | Source: Ambulatory Visit | Attending: Gastroenterology | Admitting: Gastroenterology

## 2018-12-19 DIAGNOSIS — Z20828 Contact with and (suspected) exposure to other viral communicable diseases: Secondary | ICD-10-CM | POA: Diagnosis not present

## 2018-12-19 DIAGNOSIS — R197 Diarrhea, unspecified: Secondary | ICD-10-CM | POA: Diagnosis not present

## 2018-12-19 DIAGNOSIS — Z01812 Encounter for preprocedural laboratory examination: Secondary | ICD-10-CM | POA: Diagnosis not present

## 2018-12-19 DIAGNOSIS — K635 Polyp of colon: Secondary | ICD-10-CM | POA: Insufficient documentation

## 2018-12-19 LAB — SARS CORONAVIRUS 2 (TAT 6-24 HRS): SARS Coronavirus 2: NEGATIVE

## 2018-12-20 ENCOUNTER — Encounter: Payer: Self-pay | Admitting: Gastroenterology

## 2018-12-22 ENCOUNTER — Ambulatory Visit
Admission: RE | Admit: 2018-12-22 | Discharge: 2018-12-22 | Disposition: A | Discharge status: Home / Self Care | Payer: Medicare Other | Attending: Gastroenterology | Admitting: Gastroenterology

## 2018-12-22 ENCOUNTER — Encounter
Admission: RE | Disposition: A | Discharge status: Home / Self Care | Payer: Self-pay | Source: Home / Self Care | Attending: Gastroenterology

## 2018-12-22 ENCOUNTER — Ambulatory Visit: Payer: Medicare Other | Admitting: Certified Registered Nurse Anesthetist

## 2018-12-22 ENCOUNTER — Other Ambulatory Visit: Payer: Self-pay

## 2018-12-22 ENCOUNTER — Encounter: Payer: Self-pay | Admitting: Gastroenterology

## 2018-12-22 DIAGNOSIS — R197 Diarrhea, unspecified: Secondary | ICD-10-CM

## 2018-12-22 DIAGNOSIS — K5730 Diverticulosis of large intestine without perforation or abscess without bleeding: Secondary | ICD-10-CM | POA: Insufficient documentation

## 2018-12-22 DIAGNOSIS — Z7902 Long term (current) use of antithrombotics/antiplatelets: Secondary | ICD-10-CM | POA: Diagnosis not present

## 2018-12-22 DIAGNOSIS — I714 Abdominal aortic aneurysm, without rupture: Secondary | ICD-10-CM | POA: Diagnosis not present

## 2018-12-22 DIAGNOSIS — D122 Benign neoplasm of ascending colon: Secondary | ICD-10-CM | POA: Diagnosis not present

## 2018-12-22 DIAGNOSIS — G479 Sleep disorder, unspecified: Secondary | ICD-10-CM | POA: Diagnosis not present

## 2018-12-22 DIAGNOSIS — Z7982 Long term (current) use of aspirin: Secondary | ICD-10-CM | POA: Diagnosis not present

## 2018-12-22 DIAGNOSIS — E785 Hyperlipidemia, unspecified: Secondary | ICD-10-CM | POA: Diagnosis not present

## 2018-12-22 DIAGNOSIS — Z87891 Personal history of nicotine dependence: Secondary | ICD-10-CM | POA: Diagnosis not present

## 2018-12-22 DIAGNOSIS — D124 Benign neoplasm of descending colon: Secondary | ICD-10-CM | POA: Diagnosis not present

## 2018-12-22 DIAGNOSIS — K219 Gastro-esophageal reflux disease without esophagitis: Secondary | ICD-10-CM | POA: Insufficient documentation

## 2018-12-22 DIAGNOSIS — I771 Stricture of artery: Secondary | ICD-10-CM | POA: Diagnosis not present

## 2018-12-22 DIAGNOSIS — Z951 Presence of aortocoronary bypass graft: Secondary | ICD-10-CM | POA: Insufficient documentation

## 2018-12-22 DIAGNOSIS — I252 Old myocardial infarction: Secondary | ICD-10-CM | POA: Diagnosis not present

## 2018-12-22 DIAGNOSIS — I2510 Atherosclerotic heart disease of native coronary artery without angina pectoris: Secondary | ICD-10-CM | POA: Insufficient documentation

## 2018-12-22 DIAGNOSIS — J449 Chronic obstructive pulmonary disease, unspecified: Secondary | ICD-10-CM | POA: Insufficient documentation

## 2018-12-22 DIAGNOSIS — I429 Cardiomyopathy, unspecified: Secondary | ICD-10-CM | POA: Insufficient documentation

## 2018-12-22 DIAGNOSIS — I10 Essential (primary) hypertension: Secondary | ICD-10-CM | POA: Insufficient documentation

## 2018-12-22 DIAGNOSIS — K635 Polyp of colon: Secondary | ICD-10-CM

## 2018-12-22 DIAGNOSIS — Z955 Presence of coronary angioplasty implant and graft: Secondary | ICD-10-CM | POA: Diagnosis not present

## 2018-12-22 DIAGNOSIS — I110 Hypertensive heart disease with heart failure: Secondary | ICD-10-CM | POA: Diagnosis not present

## 2018-12-22 DIAGNOSIS — D126 Benign neoplasm of colon, unspecified: Secondary | ICD-10-CM | POA: Diagnosis not present

## 2018-12-22 DIAGNOSIS — Z79899 Other long term (current) drug therapy: Secondary | ICD-10-CM | POA: Insufficient documentation

## 2018-12-22 DIAGNOSIS — K589 Irritable bowel syndrome without diarrhea: Secondary | ICD-10-CM

## 2018-12-22 DIAGNOSIS — I509 Heart failure, unspecified: Secondary | ICD-10-CM | POA: Diagnosis not present

## 2018-12-22 HISTORY — PX: COLONOSCOPY WITH PROPOFOL: SHX5780

## 2018-12-22 SURGERY — COLONOSCOPY WITH PROPOFOL
Anesthesia: General

## 2018-12-22 MED ORDER — PROPOFOL 500 MG/50ML IV EMUL
INTRAVENOUS | Status: DC | PRN
  Administered 2018-12-22: 175 ug/kg/min via INTRAVENOUS

## 2018-12-22 MED ORDER — LIDOCAINE HCL (CARDIAC) PF 100 MG/5ML IV SOSY
PREFILLED_SYRINGE | INTRAVENOUS | Status: DC | PRN
  Administered 2018-12-22: 50 mg via INTRAVENOUS

## 2018-12-22 MED ORDER — PROPOFOL 10 MG/ML IV BOLUS
INTRAVENOUS | Status: DC | PRN
  Administered 2018-12-22: 30 mg via INTRAVENOUS
  Administered 2018-12-22: 60 mg via INTRAVENOUS

## 2018-12-22 MED ORDER — SODIUM CHLORIDE 0.9 % IV SOLN
INTRAVENOUS | Status: DC
  Administered 2018-12-22: 11:00:00 via INTRAVENOUS

## 2018-12-22 NOTE — Op Note (Addendum)
[facility] Patient Name: [name] Procedure Date: 12/22/2018 11:17 AM MRN: [id number] Account #: [id number] Date of Birth: 1950-08-28 Admit Type: Outpatient Age: 68 Room: [facility] ENDO ROOM 4 Gender: Male Note Status: Finalized Procedure:            Colonoscopy Indications:          Clinically significant diarrhea of unexplained origin Providers:            Jonathon Bellows MD, MD Referring MD:         Venia Carbon (Referring MD) Medicines:            Monitored Anesthesia Care Complications:        No immediate complications. Procedure:            Pre-Anesthesia Assessment:                       - Prior to the procedure, a History and Physical was                        performed, and patient medications, allergies and                        sensitivities were reviewed. The patient's tolerance of                        previous anesthesia was reviewed.                       - The risks and benefits of the procedure and the                        sedation options and risks were discussed with the                        patient. All questions were answered and informed                        consent was obtained.                       - ASA Grade Assessment: II - A patient with mild                        systemic disease.                       After obtaining informed consent, the colonoscope was                        passed under direct vision. Throughout the procedure,                        the patient's blood pressure, pulse, and oxygen                        saturations were monitored continuously. The                        Colonoscope was introduced through the anus and  advanced to the the cecum, identified by the                        appendiceal orifice. The colonoscopy was performed with                        ease. The patient tolerated the procedure well. The                        quality of the bowel  preparation was excellent. Findings:      The perianal and digital rectal examinations were normal.      Two sessile polyps were found in the ascending colon. The polyps were 4       to 6 mm in size. These polyps were removed with a cold snare. Resection       and retrieval were complete.      A 14 mm polyp was found in the descending colon. The polyp was       pedunculated. The polyp was removed with a hot snare. Resection and       retrieval were complete.      Normal mucosa was found in the entire colon. Biopsies for histology were       taken with a cold forceps for evaluation of microscopic colitis.      Multiple small-mouthed diverticula were found in the sigmoid colon.      The exam was otherwise without abnormality on direct and retroflexion       views. Impression:           - Two 4 to 6 mm polyps in the ascending colon, removed                        with a cold snare. Resected and retrieved.                       - One 14 mm polyp in the descending colon, removed with                        a hot snare. Resected and retrieved.                       - Normal mucosa in the entire examined colon. Biopsied.                       - Diverticulosis in the sigmoid colon.                       - The examination was otherwise normal on direct and                        retroflexion views. Recommendation:       - Discharge patient to home (with escort).                       - Resume previous diet.                       - Continue present medications.                       - Await pathology results.                       -  Repeat colonoscopy for surveillance based on                        pathology results.                       - Return to GI office in 4 weeks. Procedure Code(s):    --- Professional ---                       [phone], Colonoscopy, flexible; with removal of tumor(s),                        polyp(s), or other lesion(s) by snare technique                       [id number], 30,  Colonoscopy, flexible; with biopsy, single                        or multiple Diagnosis Code(s):    --- Professional ---                       K63.5, Polyp of colon                       K57.30, Diverticulosis of large intestine without                        perforation or abscess without bleeding                       R19.7, Diarrhea, unspecified CPT copyright 2019 American Medical Association. All rights reserved. The codes documented in this report are preliminary and upon coder review may  be revised to meet current compliance requirements. Jonathon Bellows, MD Jonathon Bellows MD, MD 12/22/2018 11:39:09 AM This report has been signed electronically. Number of Addenda: 0 Note Initiated On: 12/22/2018 11:17 AM Scope Withdrawal Time: 0 hours 12 minutes 47 seconds  Total Procedure Duration: 0 hours 14 minutes 35 seconds  Estimated Blood Loss: Estimated blood loss: none.      [facility]

## 2018-12-22 NOTE — Transfer of Care (Signed)
Immediate Anesthesia Transfer of Care Note  Patient: [name]  Procedure(s) Performed: COLONOSCOPY WITH PROPOFOL (N/A )  Patient Location: PACU  Anesthesia Type:General  Level of Consciousness: sedated  Airway & Oxygen Therapy: Patient Spontanous Breathing and Patient connected to nasal cannula oxygen  Post-op Assessment: Report given to RN and Post -op Vital signs reviewed and stable  Post vital signs: Reviewed and stable  Last Vitals:  Vitals Value Taken Time  BP 90/69 12/22/18 1142  Temp 36.1 C 12/22/18 1142  Pulse 65 12/22/18 1145  Resp 16 12/22/18 1145  SpO2 100 % 12/22/18 1145  Vitals shown include unvalidated device data.  Last Pain:  Vitals:   12/22/18 1142  TempSrc: Tympanic  PainSc:          Complications: No apparent anesthesia complications

## 2018-12-22 NOTE — Anesthesia Post-op Follow-up Note (Signed)
Anesthesia QCDR form completed.        

## 2018-12-22 NOTE — Anesthesia Procedure Notes (Signed)
Date/Time: 12/22/2018 11:20 AM Performed by: Johnna Acosta, CRNA Pre-anesthesia Checklist: Patient identified, Emergency Drugs available, Suction available, Patient being monitored and Timeout performed Patient Re-evaluated:Patient Re-evaluated prior to induction Oxygen Delivery Method: Nasal cannula Preoxygenation: Pre-oxygenation with 100% oxygen Induction Type: IV induction

## 2018-12-22 NOTE — Anesthesia Postprocedure Evaluation (Signed)
Anesthesia Post Note  Patient: [name]  Procedure(s) Performed: COLONOSCOPY WITH PROPOFOL (N/A )  Patient location during evaluation: Endoscopy Anesthesia Type: General Level of consciousness: awake and alert Pain management: pain level controlled Vital Signs Assessment: post-procedure vital signs reviewed and stable Respiratory status: spontaneous breathing and respiratory function stable Cardiovascular status: stable Anesthetic complications: no     Last Vitals:  Vitals:   12/22/18 1016 12/22/18 1142  BP: (!) 183/107 90/69  Pulse: 78   Resp: 18   Temp: (!) 36.2 C (!) 36.1 C  SpO2: 100% 100%    Last Pain:  Vitals:   12/22/18 1212  TempSrc:   PainSc: 0-No pain                 Keiden Deskin K

## 2018-12-22 NOTE — Anesthesia Preprocedure Evaluation (Signed)
Anesthesia Evaluation  Patient identified by MRN, date of birth, ID band Patient awake    Reviewed: Allergy & Precautions, NPO status , Patient's Chart, lab work & pertinent test results  History of Anesthesia Complications Negative for: history of anesthetic complications  Airway Mallampati: III       Dental  (+) Edentulous Upper, Edentulous Lower   Pulmonary neg sleep apnea, COPD,  COPD inhaler, former smoker,           Cardiovascular hypertension, Pt. on medications and Pt. on home beta blockers + CAD, + Past MI, + Cardiac Stents, + CABG and + Peripheral Vascular Disease  (-) CHF (-) dysrhythmias (-) Valvular Problems/Murmurs     Neuro/Psych neg Seizures    GI/Hepatic Neg liver ROS, GERD  Medicated and Controlled,  Endo/Other  neg diabetes  Renal/GU negative Renal ROS     Musculoskeletal   Abdominal   Peds  Hematology   Anesthesia Other Findings   Reproductive/Obstetrics                             Anesthesia Physical Anesthesia Plan  ASA: III  Anesthesia Plan: General   Post-op Pain Management:    Induction: Intravenous  PONV Risk Score and Plan: 2 and Propofol infusion and TIVA  Airway Management Planned: Nasal Cannula  Additional Equipment:   Intra-op Plan:   Post-operative Plan:   Informed Consent: I have reviewed the patients History and Physical, chart, labs and discussed the procedure including the risks, benefits and alternatives for the proposed anesthesia with the patient or authorized representative who has indicated his/her understanding and acceptance.       Plan Discussed with:   Anesthesia Plan Comments:         Anesthesia Quick Evaluation

## 2018-12-22 NOTE — H&P (Addendum)
Jonathon Bellows, MD [address] [place], [place], [place], [address] [address], [place], [place], [place], [address] Phone: [phone]  Fax: [phone]  Primary Care Physician:  Venia Carbon, MD   Pre-Procedure History & Physical: HPI:  [name] is a 68 y.o. male is here for an colonoscopy.   Past Medical History:  Diagnosis Date  . AAA (abdominal aortic aneurysm) ([place])    a. Duplex 01/2012: stable infrarenal saccular AAA at 3.3cm x 3.2xm (f/u recommended 01/2013 per Dr. Rockey Situ)  . Alcohol abuse   . CAD (coronary artery disease)    a. 1995 s/p CABG x 4: LIMA->LAD, VG->RI (known to be occluded), VG->AM->PDA;  b. 05/2002 Inf STEMI: VG->AM->PDA 100% treated w/ 2 BMS complicated by acute thrombosis req 3 BMS;  c. 07/2003 DES to  LAD & LCX (VG's to PDA & RI 100%);  d. [place]/2010 [place] ([place] to LCX & LM , LIMA ok,;  e.12/2011 Cath: LM/LCX stents ok , LIMA patent.  . Cardiomyopathy ([place])    a. EF 35% by cath 2015.  . Diverticulitis    1/06 Diverticulitis--CT of pelvis--diffuse sigmoid divertic  . Dyspnea   . GERD (gastroesophageal reflux disease)   . Hyperlipidemia   . Hypertension   . Myocardial infarction ([place])   . PAD (peripheral artery disease) ([facility])    a. external iliac and mesenteric stenosis noted by noninvasive imaging.  . Renal artery stenosis ([place])    a. 03/2011 PTA and stenting of L RA. b. last duplex 2016 with stable 1-59% bilateral RAS, incidental >50% R EIA stenosis    Past Surgical History:  Procedure Laterality Date  . ANGIOPLASTY  1997  . CARDIAC CATHETERIZATION     8/09  Cath--vein graft occlusions which are old--no acute changes  . CARDIAC CATHETERIZATION  11/13/2010   stent x 2 @ [place]  . CARDIAC CATHETERIZATION  04/05/2014   stent placement   . CARDIAC CATHETERIZATION  10/28/2017  . CLOSED REDUCTION SHOULDER DISLOCATION    . CORONARY ANGIOPLASTY  04/05/2014   stent placement OM 1  . CORONARY ARTERY BYPASS GRAFT    . CORONARY  STENT PLACEMENT  7/12   2 stents--Promus element plus (everolimus eluting)--[facility] in [place]  . LEFT HEART CATH AND CORS/GRAFTS ANGIOGRAPHY N/A 10/28/2017   Procedure: LEFT HEART CATH AND CORS/GRAFTS ANGIOGRAPHY;  Surgeon: Leonie Man, MD;  Location: [facility];  Service: Cardiovascular;  Laterality: N/A;  . LEFT HEART CATHETERIZATION WITH CORONARY/GRAFT ANGIOGRAM N/A 01/04/2012   Procedure: LEFT HEART CATHETERIZATION WITH Beatrix Fetters;  Surgeon: Burnell Blanks, MD;  Location: [facility] CATH LAB;  Service: Cardiovascular;  Laterality: N/A;  . LEFT HEART CATHETERIZATION WITH CORONARY/GRAFT ANGIOGRAM N/A 04/05/2014   Procedure: LEFT HEART CATHETERIZATION WITH Beatrix Fetters;  Surgeon: Jettie Booze, MD;  Location: [facility] CATH LAB;  Service: Cardiovascular;  Laterality: N/A;  . PERCUTANEOUS CORONARY STENT INTERVENTION (PCI-S)  04/05/2014   Procedure: PERCUTANEOUS CORONARY STENT INTERVENTION (PCI-S);  Surgeon: Jettie Booze, MD;  Location: [facility] CATH LAB;  Service: Cardiovascular;;  OM1  . RENAL ANGIOGRAM N/A 04/16/2011   Procedure: RENAL ANGIOGRAM;  Surgeon: Burnell Blanks, MD;  Location: [facility] CATH LAB;  Service: Cardiovascular;  Laterality: N/A;  . RENAL ARTERY STENT  03/2011 ?    Prior to Admission medications   Medication Sig Start Date End Date Taking? Authorizing Provider  aspirin 81 MG tablet Take 81 mg by mouth daily.   Yes [provider]  carvedilol (COREG) 3.125 MG tablet Take 1 tablet (3.125 mg total) by mouth 2 (two) times daily. 05/01/18  Yes Minna Merritts, MD  ezetimibe (ZETIA) 10 MG tablet Take 1 tablet (10 mg total) by mouth daily. 10/05/18  Yes Minna Merritts, MD  isosorbide mononitrate (IMDUR) 30 MG 24 hr tablet Take 0.5 tablets (15 mg total) by mouth at bedtime. 08/03/18  Yes Gollan, Kathlene November, MD  lisinopril (PRINIVIL,ZESTRIL) 5 MG tablet Take 1 tablet (5 mg total) by mouth daily. 05/01/18  Yes Minna Merritts, MD  mirtazapine (REMERON) 15 MG tablet Take 1 tablet (15 mg total) by mouth at bedtime. 07/13/18  Yes Venia Carbon, MD  omeprazole (PRILOSEC) 20 MG capsule Take 20 mg by mouth daily.   Yes [provider]  rosuvastatin (CRESTOR) 40 MG tablet Take 1 tablet (40 mg total) by mouth daily. 11/13/18  Yes Minna Merritts, MD  clopidogrel (PLAVIX) 75 MG tablet Take 1 tablet (75 mg total) by mouth daily. 12/08/18   Minna Merritts, MD  loratadine (CLARITIN) 10 MG tablet Take 1 tablet (10 mg total) by mouth daily. Patient not taking: Reported on 12/12/2018 05/23/18   Lesleigh Noe, MD  naproxen sodium (ALEVE) 220 MG tablet Take 220 mg by mouth daily as needed (pain).    [provider]  nitroGLYCERIN (NITROSTAT) 0.4 MG SL tablet Place 1 tablet (0.4 mg total) under the tongue every 5 (five) minutes as needed for chest pain. 06/01/17   Minna Merritts, MD  pantoprazole (PROTONIX) 40 MG tablet TAKE 1 TABLET (40 MG TOTAL) BY MOUTH DAILY AT 6 (SIX) AM. Patient not taking: Reported on 12/12/2018 11/23/17   Minna Merritts, MD    Allergies as of 12/18/2018 - Review Complete 12/12/2018  Allergen Reaction Noted  . Metoclopramide    . Metoprolol tartrate Hives and Itching 12/21/2007    Family History  Problem Relation Age of Onset  . Coronary artery disease Mother        Died MI age 50  . Hypertension Mother   . Heart attack Mother   . Coronary artery disease Sister        Living  . Coronary artery disease Brother        Living  . Coronary artery disease Father        Died MI age 21  . Heart attack Father   . Heart attack Maternal Grandfather   . Diabetes Neg Hx   . Cancer Neg Hx        prostate or colon    Social History   Socioeconomic History  . Marital status: Married    Spouse name: Not on file  . Number of children: 3  . Years of education: Not on file  . Highest education level: Not on file  Occupational History  . 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 Financial resource strain: Not on file  . Food insecurity    Worry: Not on file    Inability: Not on file  . Transportation needs    Medical: Not on file    Non-medical: Not on file  Tobacco Use  . Smoking status: Former Smoker    Packs/day: 3.00    Years: 25.00    Pack years: 75.00    Types: Cigarettes    Quit date: 05/28/1993    Years since quitting: 25.5  . Smokeless tobacco: Never Used  Substance and Sexual Activity  . Alcohol use: Yes    Alcohol/week: 42.0 standard drinks    Types: 42 Cans of beer per week  . Drug use: No  . Sexual activity: Not on file  Lifestyle  . Physical activity    Days per week: Not on file    Minutes per session: Not on file  . Stress: Not on file  Relationships  . Social Herbalist on phone: Not on file    Gets together: Not on file    Attends religious service: Not on file    Active member of club or organization: Not on file    Attends meetings of clubs or organizations: Not on file    Relationship status: Not on file  . Intimate partner violence    Fear of current or ex partner: Not on file    Emotionally abused: Not on file    Physically abused: Not on file    Forced sexual activity: Not on file  Other Topics Concern  . Not on file  Social History Narrative   No living will   No health care POA but requests wife--then children   Would accept resuscitation   Not sure about tube feeds    Review of Systems: See HPI, otherwise negative ROS  Physical Exam: BP (!) 183/107   Pulse 78   Temp (!) 97.2 F (36.2 C) (Tympanic)   Resp 18   Wt 72.8 kg   SpO2 100%   BMI 27.78 kg/m  General:   Alert,  pleasant and cooperative in NAD Head:  Normocephalic and atraumatic. Neck:  Supple; no masses or thyromegaly. Lungs:  Clear throughout to auscultation, normal respiratory effort.    Heart:  +S1, +S2, Regular rate and rhythm, No edema. Abdomen:  Soft, nontender and  nondistended. Normal bowel sounds, without guarding, and without rebound.   Neurologic:  Alert and  oriented x4;  grossly normal neurologically.  Impression/Plan: [name] is here for an colonoscopy to be performed for diarrhea Risks, benefits, limitations, and alternatives regarding  colonoscopy have been reviewed with the patient.  Questions have been answered.  All parties agreeable.   Jonathon Bellows, MD  12/22/2018, 11:12 AM

## 2018-12-25 LAB — SURGICAL PATHOLOGY

## 2018-12-28 ENCOUNTER — Encounter: Payer: Self-pay | Admitting: Gastroenterology

## 2018-12-29 LAB — GI PROFILE, STOOL, PCR

## 2018-12-29 LAB — CALPROTECTIN, FECAL: Calprotectin, Fecal: 34 ug/g (ref 0–120)

## 2018-12-29 LAB — C DIFFICILE, CYTOTOXIN B

## 2018-12-29 LAB — C DIFFICILE TOXINS A+B W/RFLX: C difficile Toxins A+B, EIA: NEGATIVE

## 2019-01-08 ENCOUNTER — Other Ambulatory Visit: Payer: Self-pay | Admitting: Cardiovascular Disease

## 2019-01-10 ENCOUNTER — Other Ambulatory Visit: Payer: Self-pay

## 2019-01-10 MED ORDER — CARVEDILOL 3.125 MG PO TABS: 3.1250 mg | ORAL_TABLET | Freq: Two times a day (BID) | ORAL | 2 refills | Status: DC

## 2019-01-24 ENCOUNTER — Encounter: Payer: Self-pay | Admitting: Gastroenterology

## 2019-01-24 ENCOUNTER — Other Ambulatory Visit: Payer: Self-pay

## 2019-01-24 ENCOUNTER — Ambulatory Visit (INDEPENDENT_AMBULATORY_CARE_PROVIDER_SITE_OTHER): Payer: Medicare Other | Admitting: Gastroenterology

## 2019-01-24 VITALS — BP 178/90 | HR 62 | Temp 97.6°F | Ht 63.75 in | Wt 159.4 lb

## 2019-01-24 DIAGNOSIS — I701 Atherosclerosis of renal artery: Secondary | ICD-10-CM | POA: Diagnosis not present

## 2019-01-24 DIAGNOSIS — K580 Irritable bowel syndrome with diarrhea: Secondary | ICD-10-CM | POA: Diagnosis not present

## 2019-01-24 MED ORDER — RIFAXIMIN 550 MG PO TABS: 550.0000 mg | ORAL_TABLET | Freq: Three times a day (TID) | ORAL | 0 refills | Status: AC

## 2019-01-24 NOTE — Patient Instructions (Signed)

## 2019-01-24 NOTE — Progress Notes (Signed)
Jonathon Bellows MD, MRCP(U.K) [address]  [place]  [place], [place] [address]  Main: [phone]  Fax: [phone]   Primary Care Physician: Venia Carbon, MD  Primary Gastroenterologist:  Dr. Jonathon Bellows   Follow-up for diarrhea  HPI: [name] is a 68 y.o. male was initially seen on 12/12/2018 for diarrhea ongoing for about a few months.  He was having 8 bowel movements per day.  He was consuming 5 glasses of sweet tea a day.  I performed a colonoscopy on 12/22/2018. 3 polyps were resected 2 polyps 4 to 6 mm in the ascending colon and one 14 mm polyp in the descending colon.  Random colon biopsies showed no features of microscopic colitis.  The polyps resected were all tubular adenomas with no high-grade dysplasia or malignancy.  Stool test for C. difficile were negative, fecal calprotectin with normal.  GI PCR was negative.  Since his colonoscopy he says he continues to have diarrhea about 5 bowel movements per day.  He has cut out all the sugary drinks in his diet.  Denies any abdominal pain  Current Outpatient Medications  Medication Sig Dispense Refill  . aspirin 81 MG tablet Take 81 mg by mouth daily.    . carvedilol (COREG) 3.125 MG tablet Take 1 tablet (3.125 mg total) by mouth 2 (two) times daily. 180 tablet 2  . clopidogrel (PLAVIX) 75 MG tablet Take 1 tablet (75 mg total) by mouth daily. 90 tablet 3  . ezetimibe (ZETIA) 10 MG tablet Take 1 tablet (10 mg total) by mouth daily. 90 tablet 2  . isosorbide mononitrate (IMDUR) 30 MG 24 hr tablet Take 0.5 tablets (15 mg total) by mouth at bedtime. 45 tablet 1  . lisinopril (PRINIVIL,ZESTRIL) 5 MG tablet Take 1 tablet (5 mg total) by mouth daily. 90 tablet 2  . loratadine (CLARITIN) 10 MG tablet Take 1 tablet (10 mg total) by mouth daily. (Patient not taking: Reported on 12/12/2018) 30 tablet 11  . mirtazapine (REMERON) 15 MG tablet Take 1 tablet (15 mg total) by mouth at bedtime. 90 tablet 3  . naproxen sodium (ALEVE)  220 MG tablet Take 220 mg by mouth daily as needed (pain).    . nitroGLYCERIN (NITROSTAT) 0.4 MG SL tablet Place 1 tablet (0.4 mg total) under the tongue every 5 (five) minutes as needed for chest pain. 25 tablet 2  . omeprazole (PRILOSEC) 20 MG capsule Take 20 mg by mouth daily.    . pantoprazole (PROTONIX) 40 MG tablet TAKE 1 TABLET (40 MG TOTAL) BY MOUTH DAILY AT 6 (SIX) AM. (Patient not taking: Reported on 12/12/2018) 30 tablet 3  . rosuvastatin (CRESTOR) 40 MG tablet TAKE 1 TABLET BY MOUTH EVERY DAY 90 tablet 3   No current facility-administered medications for this visit.     Allergies as of 01/24/2019 - Review Complete 12/22/2018  Allergen Reaction Noted  . Metoprolol tartrate Hives and Itching 12/21/2007  . Metoclopramide Nausea Only     ROS:  General: Negative for anorexia, weight loss, fever, chills, fatigue, weakness. ENT: Negative for hoarseness, difficulty swallowing , nasal congestion. CV: Negative for chest pain, angina, palpitations, dyspnea on exertion, peripheral edema.  Respiratory: Negative for dyspnea at rest, dyspnea on exertion, cough, sputum, wheezing.  GI: See history of present illness. GU:  Negative for dysuria, hematuria, urinary incontinence, urinary frequency, nocturnal urination.  Endo: Negative for unusual weight change.    Physical Examination:   There were no vitals taken for this visit.  General: Well-nourished, well-developed in no acute distress.  Eyes: No icterus. Conjunctivae pink. Mouth: Oropharyngeal mucosa moist and pink , no lesions erythema or exudate. Lungs: Clear to auscultation bilaterally. Non-labored. Heart: Regular rate and rhythm, no murmurs rubs or gallops.  Abdomen: Bowel sounds are normal, nontender, nondistended, no hepatosplenomegaly or masses, no abdominal bruits or hernia , no rebound or guarding.   Extremities: No lower extremity edema. No clubbing or deformities. Neuro: Alert and oriented x 3.  Grossly intact. Skin: Warm  and dry, no jaundice.   Psych: Alert and cooperative, normal mood and affect.   Imaging Studies: No results found.  Assessment and Plan:   [name] is a 68 y.o. y/o male here to follow-up for chronic diarrhea.  olonoscopy with biopsies did not show evidence of microscopic colitis.  Fecal calprotectin is normal suggesting there is no got inflammation.  It is very likely that he has irritable bowel syndrome with diarrhea.  Plan 1.  Limit the consumption of sodas and sweet drinks. 2.  A course of Xifaxan to treat IBS D.  In addition can try Imodium as needed.  Dr Jonathon Bellows  MD,MRCP [facility]) Follow up in 3 to 4 weeks telephone visit

## 2019-01-29 ENCOUNTER — Telehealth: Payer: Self-pay

## 2019-01-29 NOTE — Telephone Encounter (Signed)
Did we send to speciality phjarmacy ?

## 2019-01-29 NOTE — Telephone Encounter (Signed)
We have not yet. Patient wanted to know if he could take anything else before we do that. I will send it now if you want me to

## 2019-01-29 NOTE — Telephone Encounter (Signed)
Faxed paper work to [facility], [facility]

## 2019-01-29 NOTE — Telephone Encounter (Signed)
Nothing else at this time 

## 2019-01-29 NOTE — Telephone Encounter (Signed)
Patient is calling because patient states the Xifaxan that was prescribed for him was 600 dollars with his insurance at the pharmacy. Patient wants to know if there is anything else that you recommend or do you want [place] to go through the speciality pharmacy to get the medication for him.   RX insurance  [place]   ID [id number]  BIN [id number]  Group [id number]

## 2019-02-01 ENCOUNTER — Telehealth: Payer: Self-pay | Admitting: Gastroenterology

## 2019-02-01 NOTE — Telephone Encounter (Signed)
Pt left vm he is checking on the status of his cheaper prescription for ? 550 mg cb [phone]

## 2019-02-02 NOTE — Telephone Encounter (Signed)
Spoke with pt and informed him that the specialty pharmacy is not able to assist with a lower cost for the Xifaxan as the funding for this medication is not available at this time. I explained to pt that we now have enough samples of the Xifaxan here at our office to cover full 14 day treatment. Pt agrees to pick up samples on Monday next week.

## 2019-02-02 NOTE — Telephone Encounter (Signed)
Pt is aware that we have sent the prescription for xifaxan to a specialty pharmacy. I explained that I will call to check on the status today once the pharmacy opens and I will then callback to update pt.

## 2019-02-16 ENCOUNTER — Encounter: Payer: Self-pay | Admitting: Internal Medicine

## 2019-02-16 ENCOUNTER — Other Ambulatory Visit: Payer: Self-pay

## 2019-02-16 ENCOUNTER — Ambulatory Visit (INDEPENDENT_AMBULATORY_CARE_PROVIDER_SITE_OTHER): Payer: Medicare Other | Admitting: Internal Medicine

## 2019-02-16 VITALS — BP 136/90 | HR 49 | Ht 63.5 in | Wt 159.0 lb

## 2019-02-16 DIAGNOSIS — Z23 Encounter for immunization: Secondary | ICD-10-CM | POA: Diagnosis not present

## 2019-02-16 DIAGNOSIS — Z7189 Other specified counseling: Secondary | ICD-10-CM

## 2019-02-16 DIAGNOSIS — G479 Sleep disorder, unspecified: Secondary | ICD-10-CM | POA: Diagnosis not present

## 2019-02-16 DIAGNOSIS — I25119 Atherosclerotic heart disease of native coronary artery with unspecified angina pectoris: Secondary | ICD-10-CM | POA: Diagnosis not present

## 2019-02-16 DIAGNOSIS — K580 Irritable bowel syndrome with diarrhea: Secondary | ICD-10-CM

## 2019-02-16 DIAGNOSIS — J449 Chronic obstructive pulmonary disease, unspecified: Secondary | ICD-10-CM

## 2019-02-16 DIAGNOSIS — I5032 Chronic diastolic (congestive) heart failure: Secondary | ICD-10-CM

## 2019-02-16 DIAGNOSIS — Z0000 Encounter for general adult medical examination without abnormal findings: Secondary | ICD-10-CM

## 2019-02-16 MED ORDER — NITROGLYCERIN 0.4 MG SL SUBL: 0.4000 mg | SUBLINGUAL_TABLET | SUBLINGUAL | 2 refills | Status: DC | PRN

## 2019-02-16 NOTE — Assessment & Plan Note (Signed)
Colon did not show IBD Better on xifaxan but can't afford Seeing Dr Vicente Males

## 2019-02-16 NOTE — Assessment & Plan Note (Signed)
Some diastolic dysfunction and borderline EF Fluid status is fine--uses support hose No changes needed

## 2019-02-16 NOTE — Assessment & Plan Note (Signed)
Has stable DOE No chest pain on the isosorbide

## 2019-02-16 NOTE — Progress Notes (Signed)
Subjective:    Patient ID: [name], male    DOB: [birth year], 68 y.o.   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 Hearing is fine Has tripped several times and fallen (or stepped in hole)--no injuries No depression or anhedonia Independent with instrumental ADLs No sig memory problems  Bowel issues are getting better Was put on antibiotic by Dr Vicente Males (dificid) for IBS (but can't afford any --just on samples) Appetite is better and diarrhea is improved  No problems with heart No chest pain  No dizziness or syncope Does wears support hose---no furosemide now Hasn't needed extra nitro recently--but keeps it in his pocket Dr Rockey Situ monitors his AAA as well--very slight progression  Same DOE--stable though No cough or wheezing  Has RLS--mirtazapine has helped this Didn't do well off of it--- about a year ago  Current Outpatient Medications on File Prior to Visit  Medication Sig Dispense Refill  . aspirin 81 MG tablet Take 81 mg by mouth daily.    . carvedilol (COREG) 3.125 MG tablet Take 1 tablet (3.125 mg total) by mouth 2 (two) times daily. 180 tablet 2  . clopidogrel (PLAVIX) 75 MG tablet Take 1 tablet (75 mg total) by mouth daily. 90 tablet 3  . ezetimibe (ZETIA) 10 MG tablet Take 1 tablet (10 mg total) by mouth daily. 90 tablet 2  . isosorbide mononitrate (IMDUR) 30 MG 24 hr tablet Take 0.5 tablets (15 mg total) by mouth at bedtime. 45 tablet 1  . lisinopril (PRINIVIL,ZESTRIL) 5 MG tablet Take 1 tablet (5 mg total) by mouth daily. 90 tablet 2  . mirtazapine (REMERON) 15 MG tablet Take 1 tablet (15 mg total) by mouth at bedtime. 90 tablet 3  . naproxen sodium (ALEVE) 220 MG  tablet Take 220 mg by mouth daily as needed (pain).    . nitroGLYCERIN (NITROSTAT) 0.4 MG SL tablet Place 1 tablet (0.4 mg total) under the tongue every 5 (five) minutes as needed for chest pain. 25 tablet 2  . omeprazole (PRILOSEC) 20 MG capsule Take 20 mg by mouth daily.    . rosuvastatin (CRESTOR) 40 MG tablet TAKE 1 TABLET BY MOUTH EVERY DAY 90 tablet 3   No current facility-administered medications on file prior to visit.     Allergies  Allergen Reactions  . Metoprolol Tartrate Hives and Itching  . Metoclopramide Nausea Only    Other reaction(s): Unknown    Past Medical History:  Diagnosis Date  . AAA (abdominal aortic aneurysm) ([place])    a. Duplex 01/2012: stable infrarenal saccular AAA at 3.3cm x 3.2xm (f/u recommended 01/2013 per Dr. Rockey Situ)  . Alcohol abuse   . CAD (coronary artery disease)    a. 1995 s/p CABG x 4: LIMA->LAD, VG->RI (known to be occluded), VG->AM->PDA;  b. 05/2002 Inf STEMI: VG->AM->PDA 100% treated w/ 2 BMS complicated by acute thrombosis req 3 BMS;  c. 07/2003 DES to  LAD & LCX (VG's to PDA & RI 100%);  d. [place] ([place] DES to LCX & LM , LIMA ok,;  e.12/2011 Cath: LM/LCX stents ok , LIMA patent.  . Cardiomyopathy ([place])    a. EF 35% by cath 2015.  . Diverticulitis    1/06 Diverticulitis--CT of pelvis--diffuse sigmoid  divertic  . Dyspnea   . GERD (gastroesophageal reflux disease)   . Hyperlipidemia   . Hypertension   . Myocardial infarction ([place])   . PAD (peripheral artery disease) ([facility])    a. external iliac and mesenteric stenosis noted by noninvasive imaging.  . Renal artery stenosis ([place])    a. 03/2011 PTA and stenting of L RA. b. last duplex 2016 with stable 1-59% bilateral RAS, incidental >50% R EIA stenosis    Past Surgical History:  Procedure Laterality Date  . ANGIOPLASTY  1997  . CARDIAC CATHETERIZATION     8/09  Cath--vein graft occlusions which are old--no acute changes  . CARDIAC CATHETERIZATION  11/13/2010   stent x 2 @ [place]  .  CARDIAC CATHETERIZATION  04/05/2014   stent placement   . CARDIAC CATHETERIZATION  10/28/2017  . CLOSED REDUCTION SHOULDER DISLOCATION    . COLONOSCOPY WITH PROPOFOL N/A 12/22/2018   Procedure: COLONOSCOPY WITH PROPOFOL;  Surgeon: Jonathon Bellows, MD;  Location: [facility] ENDOSCOPY;  Service: Gastroenterology;  Laterality: N/A;  . CORONARY ANGIOPLASTY  04/05/2014   stent placement OM 1  . CORONARY ARTERY BYPASS GRAFT    . CORONARY STENT PLACEMENT  7/12   2 stents--Promus element plus (everolimus eluting)--[facility] in [place]  . LEFT HEART CATH AND CORS/GRAFTS ANGIOGRAPHY N/A 10/28/2017   Procedure: LEFT HEART CATH AND CORS/GRAFTS ANGIOGRAPHY;  Surgeon: Leonie Man, MD;  Location: [place] [facility];  Service: Cardiovascular;  Laterality: N/A;  . LEFT HEART CATHETERIZATION WITH CORONARY/GRAFT ANGIOGRAM N/A 01/04/2012   Procedure: LEFT HEART CATHETERIZATION WITH Beatrix Fetters;  Surgeon: Burnell Blanks, MD;  Location: [facility] CATH LAB;  Service: Cardiovascular;  Laterality: N/A;  . LEFT HEART CATHETERIZATION WITH CORONARY/GRAFT ANGIOGRAM N/A 04/05/2014   Procedure: LEFT HEART CATHETERIZATION WITH Beatrix Fetters;  Surgeon: Jettie Booze, MD;  Location: [facility] CATH LAB;  Service: Cardiovascular;  Laterality: N/A;  . PERCUTANEOUS CORONARY STENT INTERVENTION (PCI-S)  04/05/2014   Procedure: PERCUTANEOUS CORONARY STENT INTERVENTION (PCI-S);  Surgeon: Jettie Booze, MD;  Location: [facility] CATH LAB;  Service: Cardiovascular;;  OM1  . RENAL ANGIOGRAM N/A 04/16/2011   Procedure: RENAL ANGIOGRAM;  Surgeon: Burnell Blanks, MD;  Location: [facility] CATH LAB;  Service: Cardiovascular;  Laterality: N/A;  . RENAL ARTERY STENT  03/2011 ?    Family History  Problem Relation Age of Onset  . Coronary artery disease Mother        Died MI age 62  . Hypertension Mother   . Heart attack Mother   . Coronary artery disease Sister        Living  . Coronary artery disease Brother         Living  . Coronary artery disease Father        Died MI age 87  . Heart attack Father   . Heart attack Maternal Grandfather   . Diabetes Neg Hx   . Cancer Neg Hx        prostate or colon    Social History   Socioeconomic History  . Marital status: Married    Spouse name: Not on file  . Number of children: 3  . Years of education: Not on file  . Highest education level: Not on file  Occupational History  . 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 Financial resource strain: Not on file  . Food insecurity    Worry: Not on file    Inability: Not  on file  . Transportation needs    Medical: Not on file    Non-medical: Not on file  Tobacco Use  . Smoking status: Former Smoker    Packs/day: 3.00    Years: 25.00    Pack years: 75.00    Types: Cigarettes    Quit date: 05/28/1993    Years since quitting: 25.7  . Smokeless tobacco: Never Used  Substance and Sexual Activity  . Alcohol use: Yes    Alcohol/week: 42.0 standard drinks    Types: 42 Cans of beer per week  . Drug use: No  . Sexual activity: Not on file  Lifestyle  . Physical activity    Days per week: Not on file    Minutes per session: Not on file  . Stress: Not on file  Relationships  . Social Herbalist on phone: Not on file    Gets together: Not on file    Attends religious service: Not on file    Active member of club or organization: Not on file    Attends meetings of clubs or organizations: Not on file    Relationship status: Not on file  . Intimate partner violence    Fear of current or ex partner: Not on file    Emotionally abused: Not on file    Physically abused: Not on file    Forced sexual activity: Not on file  Other Topics Concern  . Not on file  Social History Narrative   No living will   No health care POA but requests wife--then children   Would accept resuscitation   Not sure about tube feeds   Review of Systems  Appetite is better--eating some better now Weight is stable Wears seat belt Working on lower dentures--will get implants. Has top ones--wears at times No rash. Scab on right forearm--takes a long time to heal No heartburn on PPI. No dysphagia Voids okay---good stream. Nocturia x 2 Some left hip pain at times. Intermittent back pain--wears velcro brace Rare headaches---OTC will help    Objective:   Physical Exam  Constitutional: He is oriented to person, place, and time. He appears well-developed. No distress.  HENT:  Mouth/Throat: Oropharynx is clear and moist. No oropharyngeal exudate.  No oral lesions  Neck: No thyromegaly present.  Cardiovascular: Normal rate, regular rhythm and normal heart sounds. Exam reveals no gallop.  No murmur heard. Very faint pedal pulses  Respiratory: Effort normal. No respiratory distress. He has no wheezes. He has no rales.  Slightly decreased breath sounds but clear  GI: Soft. There is no abdominal tenderness.  Musculoskeletal:        General: No tenderness or edema.  Lymphadenopathy:    He has no cervical adenopathy.  Neurological: He is alert and oriented to person, place, and time.  President--- "Daisy Floro, Barack Obama, Bush" [phone] [id number] Recall 3/3  Skin: No rash noted. No erythema.  Psychiatric: He has a normal mood and affect. His behavior is normal.           Assessment & Plan:

## 2019-02-16 NOTE — Assessment & Plan Note (Signed)
Mild and no Rx needed Fortunately, stopped smoking some time ago

## 2019-02-16 NOTE — Addendum Note (Signed)
Addended by: Pilar Grammes on: 02/16/2019 09:25 AM   Modules accepted: Orders

## 2019-02-16 NOTE — Assessment & Plan Note (Signed)
I have personally reviewed the Medicare Annual Wellness questionnaire and have noted 1. The patient's medical and social history 2. Their use of alcohol, tobacco or illicit drugs 3. Their current medications and supplements 4. The patient's functional ability including ADL's, fall risks, home safety risks and hearing or visual             impairment. 5. Diet and physical activities 6. Evidence for depression or mood disorders  The patients weight, height, BMI and visual acuity have been recorded in the chart I have made referrals, counseling and provided education to the patient based review of the above and I have provided the pt with a written personalized care plan for preventive services.  I have provided you with a copy of your personalized plan for preventive services. Please take the time to review along with your updated medication list.  Flu vaccine today Still prefers no PSA---will consider at least one test by age 68 Discussed exercise Colon due again in 3 years due to polyps

## 2019-02-16 NOTE — Assessment & Plan Note (Signed)
See social history 

## 2019-02-16 NOTE — Progress Notes (Signed)
Hearing Screening   125Hz  250Hz  500Hz  1000Hz  2000Hz  3000Hz  4000Hz  6000Hz  8000Hz   Right ear:   20 20 20  20     Left ear:   20 20 20  20       Visual Acuity Screening   Right eye Left eye Both eyes  Without correction:     With correction: 20/25 20/20 20/15

## 2019-02-16 NOTE — Assessment & Plan Note (Signed)
Mostly RLS but responds to low dose mirtazapine

## 2019-03-07 ENCOUNTER — Ambulatory Visit (INDEPENDENT_AMBULATORY_CARE_PROVIDER_SITE_OTHER): Payer: Medicare Other | Admitting: Gastroenterology

## 2019-03-07 DIAGNOSIS — K580 Irritable bowel syndrome with diarrhea: Secondary | ICD-10-CM

## 2019-03-07 DIAGNOSIS — I701 Atherosclerosis of renal artery: Secondary | ICD-10-CM

## 2019-03-07 NOTE — Progress Notes (Signed)
[name] , MD [address]  [place]  [place], [place] [address]  Main: [phone]  Fax: [phone]   Primary Care Physician: Venia Carbon, MD  Virtual Visit via Video Note  I connected with patient on 03/07/19 at  8:45 AM EST by video and verified that I am speaking with the correct person using two identifiers.   I discussed the limitations, risks, security and privacy concerns of performing an evaluation and management service by video  and the availability of in person appointments. I also discussed with the patient that there may be a patient responsible charge related to this service. The patient expressed understanding and agreed to proceed.  Location of Patient: Home Location of Provider: Home Persons involved: Patient and provider only   History of Present Illness:   Follow-up for IBS  HPI: [name] is a 68 y.o. male  Summary of history :  He was initially seen in 12/12/2018 for diarrhea ongoing for a few months likely secondary to irritable trouble bowel syndrome with diarrhea.  8 bowel movements per day, consuming 5 glasses of sweet tea a day.I performed a colonoscopy on 12/22/2018. 3 polyps were resected 2 polyps 4 to 6 mm in the ascending colon and one 14 mm polyp in the descending colon.  Random colon biopsies showed no features of microscopic colitis.  The polyps resected were all tubular adenomas with no high-grade dysplasia or malignancy.  Stool test for C. difficile were negative, fecal calprotectin with normal.  GI PCR was negative.  Was advised to cut off all sugary drinks in his diet.  Interval history 01/24/2019-03/07/2019  Visit initially set up as a video visit but was later converted to a telephone visit as the patient has difficulty staying long-term.  Finished antibiotics , has 3-4 bowel movements a day , more formed . Appetite is slowly improved .   Denies using any NSAIDs on a regular basis.    Current Outpatient  Medications  Medication Sig Dispense Refill  . aspirin 81 MG tablet Take 81 mg by mouth daily.    . carvedilol (COREG) 3.125 MG tablet Take 1 tablet (3.125 mg total) by mouth 2 (two) times daily. 180 tablet 2  . clopidogrel (PLAVIX) 75 MG tablet Take 1 tablet (75 mg total) by mouth daily. 90 tablet 3  . ezetimibe (ZETIA) 10 MG tablet Take 1 tablet (10 mg total) by mouth daily. 90 tablet 2  . isosorbide mononitrate (IMDUR) 30 MG 24 hr tablet Take 0.5 tablets (15 mg total) by mouth at bedtime. 45 tablet 1  . lisinopril (PRINIVIL,ZESTRIL) 5 MG tablet Take 1 tablet (5 mg total) by mouth daily. 90 tablet 2  . mirtazapine (REMERON) 15 MG tablet Take 1 tablet (15 mg total) by mouth at bedtime. 90 tablet 3  . naproxen sodium (ALEVE) 220 MG tablet Take 220 mg by mouth daily as needed (pain).    . nitroGLYCERIN (NITROSTAT) 0.4 MG SL tablet Place 1 tablet (0.4 mg total) under the tongue every 5 (five) minutes as needed for chest pain. 25 tablet 2  . omeprazole (PRILOSEC) 20 MG capsule Take 20 mg by mouth daily.    . rosuvastatin (CRESTOR) 40 MG tablet TAKE 1 TABLET BY MOUTH EVERY DAY 90 tablet 3   No current facility-administered medications for this visit.     Allergies as of 03/07/2019 - Review Complete 02/16/2019  Allergen Reaction Noted  . Metoprolol tartrate Hives and Itching 12/21/2007  . Metoclopramide Nausea  Only     Review of Systems:    All systems reviewed and negative except where noted in HPI.  General Appearance:    Alert, cooperative, no distress, appears stated age  Head:    Normocephalic, without obvious abnormality, atraumatic  Eyes:    PERRL, conjunctiva/corneas clear,  Ears:    Grossly normal hearing    Neurologic:  Grossly normal    Observations/Objective:  Labs: CMP     Component Value Date/Time   NA 131 (L) 10/19/2018 1405   K 4.4 10/19/2018 1405   CL 98 10/19/2018 1405   CO2 26 10/19/2018 1405   GLUCOSE 112 (H) 10/19/2018 1405   BUN 9 10/19/2018 1405    CREATININE 0.70 10/19/2018 1405   CREATININE 0.84 01/07/2017 1120   CALCIUM 9.0 10/19/2018 1405   PROT 6.7 10/19/2018 1405   PROT 7.2 08/11/2015 0941   ALBUMIN 4.0 10/19/2018 1405   ALBUMIN 4.5 08/11/2015 0941   AST 73 (H) 10/19/2018 1405   ALT 46 10/19/2018 1405   ALKPHOS 47 10/19/2018 1405   BILITOT 0.6 10/19/2018 1405   BILITOT 0.6 08/11/2015 0941   GFRNONAA >60 10/29/2017 0233   GFRAA >60 10/29/2017 0233   Lab Results  Component Value Date   WBC 6.9 10/19/2018   HGB 12.9 (L) 10/19/2018   HCT 38.1 (L) 10/19/2018   MCV 96.3 10/19/2018   PLT 239.0 10/19/2018    Imaging Studies: No results found.  Assessment and Plan:   [name] is a 68 y.o. y/o male here to follow-up for chronic diarrhea.  Colonoscopy with biopsies did not show evidence of microscopic colitis.  Fecal calprotectin is normal suggesting there is no got inflammation.  It is very likely that he has irritable bowel syndrome with diarrhea.  Treated with 14-day course of Xifaxan and has had significant improvement in bowel movements and symptoms.  Still has about 3-4 bowel movements a day but his appetite is significantly improved.  I suggested that we should watch it closely avoid all sugary drinks and add fiber pills to his daily regimen and I will contact him in 6 to 8 weeks to see how he is doing.  If symptoms are worse or need to be better then we could consider repeat course of Xifaxan.       I discussed the assessment and treatment plan with the patient. The patient was provided an opportunity to ask questions and all were answered. The patient agreed with the plan and demonstrated an understanding of the instructions.   The patient was advised to call back or seek an in-person evaluation if the symptoms worsen or if the condition fails to improve as anticipated.  I provided 11 minutes of non  face-to-face time during this encounter.  Dr [name] Bellows MD,MRCP [facility]) Gastroenterology/Hepatology Pager:  [phone]   Speech recognition software was used to dictate this note.

## 2019-03-09 ENCOUNTER — Other Ambulatory Visit: Payer: Self-pay | Admitting: Cardiovascular Disease

## 2019-03-16 ENCOUNTER — Other Ambulatory Visit: Payer: Self-pay

## 2019-04-11 ENCOUNTER — Ambulatory Visit (INDEPENDENT_AMBULATORY_CARE_PROVIDER_SITE_OTHER): Payer: Medicare Other | Admitting: Internal Medicine

## 2019-04-11 ENCOUNTER — Encounter: Payer: Self-pay | Admitting: Internal Medicine

## 2019-04-11 ENCOUNTER — Telehealth: Payer: Self-pay

## 2019-04-11 ENCOUNTER — Other Ambulatory Visit: Payer: Self-pay

## 2019-04-11 DIAGNOSIS — K5792 Diverticulitis of intestine, part unspecified, without perforation or abscess without bleeding: Secondary | ICD-10-CM

## 2019-04-11 DIAGNOSIS — I701 Atherosclerosis of renal artery: Secondary | ICD-10-CM

## 2019-04-11 MED ORDER — AMOXICILLIN-POT CLAVULANATE 875-125 MG PO TABS: 1.0000 | ORAL_TABLET | Freq: Two times a day (BID) | ORAL | 0 refills | Status: DC

## 2019-04-11 NOTE — Telephone Encounter (Signed)
Made pt a virtual appt with Dr Silvio Pate now.

## 2019-04-11 NOTE — Assessment & Plan Note (Signed)
Fairly classic history and same as past spells No systemic symptoms and able to tolerate oral meds He did try waiting 3 days--but then pain got more severe last night Empiric therapy seems warranted Will Rx with augmentin Consider CT scan if ongoing symptoms

## 2019-04-11 NOTE — Progress Notes (Signed)
Subjective:    Patient ID: [name], male    DOB: 10/28/1950, 68 y.o.   MRN: [id number]  HPI  Video virtual visit due to abdominal pain and concern for recurrent diverticulitis Identification done Reviewed billing and limitations--he gave consent Participants--he is in his home and I am in my office  Started with LLQ pain  Identical to past diverticulitis spells Started 3 days ago--and was trying to wait it out Got really bad last night No fever No N/V  Appetite has been improved since GI visits and last colonoscopy  Current Outpatient Medications on File Prior to Visit  Medication Sig Dispense Refill  . aspirin 81 MG tablet Take 81 mg by mouth daily.    . carvedilol (COREG) 3.125 MG tablet Take 1 tablet (3.125 mg total) by mouth 2 (two) times daily. 180 tablet 2  . clopidogrel (PLAVIX) 75 MG tablet Take 1 tablet (75 mg total) by mouth daily. 90 tablet 3  . ezetimibe (ZETIA) 10 MG tablet Take 1 tablet (10 mg total) by mouth daily. 90 tablet 2  . isosorbide mononitrate (IMDUR) 30 MG 24 hr tablet TAKE HALF TABLETS (15 MG TOTAL) BY MOUTH AT BEDTIME. 45 tablet 3  . lisinopril (PRINIVIL,ZESTRIL) 5 MG tablet Take 1 tablet (5 mg total) by mouth daily. 90 tablet 2  . mirtazapine (REMERON) 15 MG tablet Take 1 tablet (15 mg total) by mouth at bedtime. 90 tablet 3  . naproxen sodium (ALEVE) 220 MG tablet Take 220 mg by mouth daily as needed (pain).    . nitroGLYCERIN (NITROSTAT) 0.4 MG SL tablet Place 1 tablet (0.4 mg total) under the tongue every 5 (five) minutes as needed for chest pain. 25 tablet 2  . omeprazole (PRILOSEC) 20 MG capsule Take 20 mg by mouth daily.    . rosuvastatin (CRESTOR) 40 MG tablet TAKE 1 TABLET BY MOUTH EVERY DAY 90 tablet 3   No current facility-administered medications on file prior to visit.    Allergies  Allergen Reactions  . Metoprolol Tartrate Hives and Itching  . Metoclopramide Nausea Only    Other reaction(s): Unknown    Past Medical  History:  Diagnosis Date  . AAA (abdominal aortic aneurysm) ([place])    a. Duplex 01/2012: stable infrarenal saccular AAA at 3.3cm x 3.2xm (f/u recommended 01/2013 per Dr. Rockey Situ)  . Alcohol abuse   . CAD (coronary artery disease)    a. 1995 s/p CABG x 4: LIMA->LAD, VG->RI (known to be occluded), VG->AM->PDA;  b. 05/2002 Inf STEMI: VG->AM->PDA 100% treated w/ 2 BMS complicated by acute thrombosis req 3 BMS;  c. 07/2003 DES to  LAD & LCX (VG's to PDA & RI 100%);  d. 12/2010 Acute MI ([place]): DES to LCX & LM , LIMA ok,;  e.12/2011 Cath: LM/LCX stents ok , LIMA patent.  . Cardiomyopathy ([place])    a. EF 35% by cath 2015.  . Diverticulitis    1/06 Diverticulitis--CT of pelvis--diffuse sigmoid divertic  . Dyspnea   . GERD (gastroesophageal reflux disease)   . Hyperlipidemia   . Hypertension   . Myocardial infarction ([place])   . PAD (peripheral artery disease) ([facility])    a. external iliac and mesenteric stenosis noted by noninvasive imaging.  . Renal artery stenosis ([place])    a. 03/2011 PTA and stenting of L RA. b. last duplex 2016 with stable 1-59% bilateral RAS, incidental >50% R EIA stenosis    Past Surgical History:  Procedure Laterality Date  . ANGIOPLASTY  1997  .  CARDIAC CATHETERIZATION     8/09  Cath--vein graft occlusions which are old--no acute changes  . CARDIAC CATHETERIZATION  11/13/2010   stent x 2 @ [place]  . CARDIAC CATHETERIZATION  04/05/2014   stent placement   . CARDIAC CATHETERIZATION  10/28/2017  . CLOSED REDUCTION SHOULDER DISLOCATION    . COLONOSCOPY WITH PROPOFOL N/A 12/22/2018   Procedure: COLONOSCOPY WITH PROPOFOL;  Surgeon: Jonathon Bellows, MD;  Location: [facility] ENDOSCOPY;  Service: Gastroenterology;  Laterality: N/A;  . CORONARY ANGIOPLASTY  04/05/2014   stent placement OM 1  . CORONARY ARTERY BYPASS GRAFT    . CORONARY STENT PLACEMENT  7/12   2 stents--Promus element plus (everolimus eluting)--[facility] in [place]  . LEFT HEART CATH AND CORS/GRAFTS ANGIOGRAPHY N/A  10/28/2017   Procedure: LEFT HEART CATH AND CORS/GRAFTS ANGIOGRAPHY;  Surgeon: Leonie Man, MD;  Location: [facility] CV LAB;  Service: Cardiovascular;  Laterality: N/A;  . LEFT HEART CATHETERIZATION WITH CORONARY/GRAFT ANGIOGRAM N/A 01/04/2012   Procedure: LEFT HEART CATHETERIZATION WITH Beatrix Fetters;  Surgeon: Burnell Blanks, MD;  Location: [facility] CATH LAB;  Service: Cardiovascular;  Laterality: N/A;  . LEFT HEART CATHETERIZATION WITH CORONARY/GRAFT ANGIOGRAM N/A 04/05/2014   Procedure: LEFT HEART CATHETERIZATION WITH Beatrix Fetters;  Surgeon: Jettie Booze, MD;  Location: [facility] CATH LAB;  Service: Cardiovascular;  Laterality: N/A;  . PERCUTANEOUS CORONARY STENT INTERVENTION (PCI-S)  04/05/2014   Procedure: PERCUTANEOUS CORONARY STENT INTERVENTION (PCI-S);  Surgeon: Jettie Booze, MD;  Location: [facility] CATH LAB;  Service: Cardiovascular;;  OM1  . RENAL ANGIOGRAM N/A 04/16/2011   Procedure: RENAL ANGIOGRAM;  Surgeon: Burnell Blanks, MD;  Location: [facility] CATH LAB;  Service: Cardiovascular;  Laterality: N/A;  . RENAL ARTERY STENT  03/2011 ?    Family History  Problem Relation Age of Onset  . Coronary artery disease Mother        Died MI age 35  . Hypertension Mother   . Heart attack Mother   . Coronary artery disease Sister        Living  . Coronary artery disease Brother        Living  . Coronary artery disease Father        Died MI age 73  . Heart attack Father   . Heart attack Maternal Grandfather   . Diabetes Neg Hx   . Cancer Neg Hx        prostate or colon    Social History   Socioeconomic History  . Marital status: Married    Spouse name: Not on file  . Number of children: 3  . Years of education: Not on file  . Highest education level: Not on file  Occupational History  . Occupation: Geneticist, molecular (Animal nutritionist)    Comment: Retired  Tobacco Use  . Smoking status: Former Smoker    Packs/day: 3.00    Years: 25.00     Pack years: 75.00    Types: Cigarettes    Quit date: 05/28/1993    Years since quitting: 25.8  . Smokeless tobacco: Never Used  Substance and Sexual Activity  . Alcohol use: Yes    Alcohol/week: 42.0 standard drinks    Types: 42 Cans of beer per week  . Drug use: No  . Sexual activity: Not on file  Other Topics Concern  . Not on file  Social History Narrative   No living will   No health care POA but requests wife--then children   Would accept resuscitation  Not sure about tube feeds   Social Determinants of Health   Financial Resource Strain:   . Difficulty of Paying Living Expenses: Not on file  Food Insecurity:   . Worried About Charity fundraiser in the Last Year: Not on file  . Ran Out of Food in the Last Year: Not on file  Transportation Needs:   . Lack of Transportation (Medical): Not on file  . Lack of Transportation (Non-Medical): Not on file  Physical Activity:   . Days of Exercise per Week: Not on file  . Minutes of Exercise per Session: Not on file  Stress:   . Feeling of Stress : Not on file  Social Connections:   . Frequency of Communication with Friends and Family: Not on file  . Frequency of Social Gatherings with Friends and Family: Not on file  . Attends Religious Services: Not on file  . Active Member of Clubs or Organizations: Not on file  . Attends Archivist Meetings: Not on file  . Marital Status: Not on file  Intimate Partner Violence:   . Fear of Current or Ex-Partner: Not on file  . Emotionally Abused: Not on file  . Physically Abused: Not on file  . Sexually Abused: Not on file   Review of Systems Weight is stable No cough or SOB    Objective:   Physical Exam  Constitutional: He appears well-developed. No distress.  GI:  LLQ is location of pain No significant tenderness now when he pushes there           Assessment & Plan:

## 2019-04-11 NOTE — Telephone Encounter (Signed)
Pt left v/m that has hx of diverticulitis and request abx. Last annual 02/16/19, I spoke with pt and he said he thinks it has been 2 yrs ago since last diverticulitis episode. Pt said the abx always work and he does not want to come in for appt. No fever, no N&V;  For couple of days pt having lower lt side pain; pain level now is tolerable but last night was more painful. Pt seeing GI for loose stools for years. No other covid symptoms than listed above and no travel and no known exposure to covid. Pt request abx to [facility]; pt request cb.

## 2019-04-11 NOTE — Telephone Encounter (Signed)
Will do virtual visit and decide on empiric therapy

## 2019-05-07 ENCOUNTER — Other Ambulatory Visit: Payer: Self-pay | Admitting: Cardiovascular Disease

## 2019-05-08 ENCOUNTER — Ambulatory Visit: Payer: Medicare Other | Admitting: Gastroenterology

## 2019-05-22 ENCOUNTER — Other Ambulatory Visit: Payer: Self-pay | Admitting: Cardiovascular Disease

## 2019-06-16 ENCOUNTER — Ambulatory Visit: Payer: Medicare Other | Attending: Internal Medicine

## 2019-06-16 DIAGNOSIS — Z23 Encounter for immunization: Secondary | ICD-10-CM | POA: Insufficient documentation

## 2019-06-16 NOTE — Progress Notes (Signed)
   Covid-19 Vaccination Clinic  Name:  ZYIAN BROKENSHIRE    MRN: [id number] DOB: Jan 08, 1951  06/16/2019  Mr. Polio was observed post Covid-19 immunization for 15 minutes without incidence. He was provided with Vaccine Information Sheet and instruction to access the V-Safe system.   Mr. Huneke was instructed to call 911 with any severe reactions post vaccine: Marland Kitchen Difficulty breathing  . Swelling of your face and throat  . A fast heartbeat  . A bad rash all over your body  . Dizziness and weakness    Immunizations Administered    Name Date Dose VIS Date Route   Pfizer COVID-19 Vaccine 06/16/2019 12:42 PM 0.3 mL 04/06/2019 Intramuscular   Manufacturer: [place]   Lot: [id number]   [place]: [id number]

## 2019-07-10 ENCOUNTER — Ambulatory Visit: Payer: Medicare Other | Attending: Internal Medicine

## 2019-07-10 DIAGNOSIS — Z23 Encounter for immunization: Secondary | ICD-10-CM

## 2019-07-10 NOTE — Progress Notes (Signed)
   Covid-19 Vaccination Clinic  Name:  [name]    MRN: [id number] DOB: 1950/10/22  07/10/2019  [name] was observed post Covid-19 immunization for 15 minutes without incident. He was provided with Vaccine Information Sheet and instruction to access the V-Safe system.   [name] was instructed to call 911 with any severe reactions post vaccine: Marland Kitchen Difficulty breathing  . Swelling of face and throat  . A fast heartbeat  . A bad rash all over body  . Dizziness and weakness   Immunizations Administered    Name Date Dose VIS Date Route   Pfizer COVID-19 Vaccine 07/10/2019  9:46 AM 0.3 mL 04/06/2019 Intramuscular   Manufacturer: [place]   Lot: [id number]   [place]: [id number]

## 2019-09-05 ENCOUNTER — Other Ambulatory Visit: Payer: Self-pay | Admitting: Internal Medicine

## 2019-10-11 ENCOUNTER — Other Ambulatory Visit: Payer: Self-pay

## 2019-10-11 MED ORDER — CARVEDILOL 3.125 MG PO TABS: 3.1250 mg | ORAL_TABLET | Freq: Two times a day (BID) | ORAL | 3 refills | Status: DC

## 2019-10-11 MED ORDER — CLOPIDOGREL BISULFATE 75 MG PO TABS: 75.0000 mg | ORAL_TABLET | Freq: Every day | ORAL | 3 refills | Status: DC

## 2019-10-12 ENCOUNTER — Other Ambulatory Visit: Payer: Self-pay | Admitting: Cardiovascular Disease

## 2019-11-12 ENCOUNTER — Other Ambulatory Visit: Payer: Self-pay

## 2019-11-12 ENCOUNTER — Ambulatory Visit (INDEPENDENT_AMBULATORY_CARE_PROVIDER_SITE_OTHER): Payer: Medicare Other

## 2019-11-12 ENCOUNTER — Other Ambulatory Visit: Payer: Self-pay | Admitting: Cardiovascular Disease

## 2019-11-12 DIAGNOSIS — I714 Abdominal aortic aneurysm, without rupture, unspecified: Secondary | ICD-10-CM

## 2019-11-29 ENCOUNTER — Other Ambulatory Visit: Payer: Self-pay | Admitting: Cardiovascular Disease

## 2019-12-06 ENCOUNTER — Ambulatory Visit (INDEPENDENT_AMBULATORY_CARE_PROVIDER_SITE_OTHER): Payer: Medicare Other | Admitting: Internal Medicine

## 2019-12-06 ENCOUNTER — Other Ambulatory Visit: Payer: Self-pay

## 2019-12-06 ENCOUNTER — Telehealth: Payer: Self-pay | Admitting: Internal Medicine

## 2019-12-06 ENCOUNTER — Encounter: Payer: Self-pay | Admitting: Internal Medicine

## 2019-12-06 DIAGNOSIS — K5792 Diverticulitis of intestine, part unspecified, without perforation or abscess without bleeding: Secondary | ICD-10-CM | POA: Diagnosis not present

## 2019-12-06 MED ORDER — HYDROCODONE-ACETAMINOPHEN 5-325 MG PO TABS: 1.0000 | ORAL_TABLET | ORAL | 0 refills | Status: DC | PRN

## 2019-12-06 MED ORDER — AMOXICILLIN-POT CLAVULANATE 875-125 MG PO TABS
1.0000 | ORAL_TABLET | Freq: Two times a day (BID) | ORAL | 0 refills | Status: DC
Start: 2019-12-06 — End: 2019-12-24

## 2019-12-06 NOTE — Telephone Encounter (Signed)
Please let him know I sent the prescription---but I would be more comfortable seeing him. Can he come in at 4:15 today??

## 2019-12-06 NOTE — Assessment & Plan Note (Addendum)
Fairly classic presentation---especially for him Pain is pretty severe but currently has no peritoneal signs Will start the augmentin Hydrocodone for pain Needs to call 911 if worsens---likely would indicate rupture  Has been having some regular pain issues with this---even before the infection This is mostly due to loose stools, not apparently diverticular disease Discussed imodium Doubt surgery should be considered for this

## 2019-12-06 NOTE — Telephone Encounter (Signed)
I spoke to patient and scheduled appointment for today at 4:15.

## 2019-12-06 NOTE — Progress Notes (Signed)
Subjective:    Patient ID: [name], male    DOB: [birth year], 69 y.o.   MRN: [id number]  HPI Here due to severe abdominal pain again This visit occurred during the SARS-CoV-2 public health emergency.  Safety protocols were in place, including screening questions prior to the visit, additional usage of staff PPE, and extensive cleaning of exam room while observing appropriate contact time as indicated for disinfecting solutions.   Awoke at 4AM---rectal urgency Didn't go but had a lot of gas Awoke at 7:30AM--and the pain started This has happened in the past and it would improve He noted worsening by lunchtime  Not so bad now--when sitting Goes up to severe (8-9/10) if walking Will double him over at times Has moved bowels multiple times today--- first 2 semisolid, then liquidy (this is not new for him)  Current Outpatient Medications on File Prior to Visit  Medication Sig Dispense Refill  . amoxicillin-clavulanate (AUGMENTIN) 875-125 MG tablet Take 1 tablet by mouth 2 (two) times daily. 14 tablet 0  . aspirin 81 MG tablet Take 81 mg by mouth daily.    . carvedilol (COREG) 3.125 MG tablet Take 1 tablet (3.125 mg total) by mouth 2 (two) times daily. 180 tablet 3  . clopidogrel (PLAVIX) 75 MG tablet Take 1 tablet (75 mg total) by mouth daily. 90 tablet 3  . ezetimibe (ZETIA) 10 MG tablet TAKE 1 TABLET BY MOUTH EVERY DAY 90 tablet 3  . isosorbide mononitrate (IMDUR) 30 MG 24 hr tablet TAKE HALF TABLETS (15 MG TOTAL) BY MOUTH AT BEDTIME. 45 tablet 3  . lisinopril (ZESTRIL) 5 MG tablet TAKE 1 TABLET BY MOUTH EVERY DAY 90 tablet 3  . mirtazapine (REMERON) 15 MG tablet TAKE 1 TABLET BY MOUTH EVERYDAY AT BEDTIME 90 tablet 3  . naproxen sodium (ALEVE) 220 MG tablet Take 220 mg by mouth daily as needed (pain).    . nitroGLYCERIN (NITROSTAT) 0.4 MG SL tablet Place 1 tablet (0.4 mg total) under the tongue every 5 (five) minutes as needed for chest pain. 25 tablet 2  . omeprazole (PRILOSEC)  20 MG capsule Take 20 mg by mouth daily.    . rosuvastatin (CRESTOR) 40 MG tablet TAKE 1 TABLET BY MOUTH EVERY DAY 90 tablet 0   No current facility-administered medications on file prior to visit.    Allergies  Allergen Reactions  . Metoprolol Tartrate Hives and Itching  . Metoclopramide Nausea Only    Other reaction(s): Unknown    Past Medical History:  Diagnosis Date  . AAA (abdominal aortic aneurysm) ([place])    a. Duplex 01/2012: stable infrarenal saccular AAA at 3.3cm x 3.2xm (f/u recommended 01/2013 per Dr. Rockey Situ)  . Alcohol abuse   . CAD (coronary artery disease)    a. 1995 s/p CABG x 4: LIMA->LAD, VG->RI (known to be occluded), VG->AM->PDA;  b. 05/2002 Inf STEMI: VG->AM->PDA 100% treated w/ 2 BMS complicated by acute thrombosis req 3 BMS;  c. 07/2003 DES to  LAD & LCX (VG's to PDA & RI 100%);  d. 12/2010 [place] ([place] DES to LCX & LM , LIMA ok,;  e.12/2011 Cath: LM/LCX stents ok , LIMA patent.  . Cardiomyopathy ([place])    a. EF 35% by cath 2015.  . Diverticulitis    1/06 Diverticulitis--CT of pelvis--diffuse sigmoid divertic  . Dyspnea   . GERD (gastroesophageal reflux disease)   . Hyperlipidemia   . Hypertension   . Myocardial infarction ([place])   . PAD (peripheral artery disease) (  [place])    a. external iliac and mesenteric stenosis noted by noninvasive imaging.  . Renal artery stenosis ([place])    a. 03/2011 PTA and stenting of L RA. b. last duplex 2016 with stable 1-59% bilateral RAS, incidental >50% R EIA stenosis    Past Surgical History:  Procedure Laterality Date  . ANGIOPLASTY  1997  . CARDIAC CATHETERIZATION     8/09  Cath--vein graft occlusions which are old--no acute changes  . CARDIAC CATHETERIZATION  11/13/2010   stent x 2 @ [place]  . CARDIAC CATHETERIZATION  04/05/2014   stent placement   . CARDIAC CATHETERIZATION  10/28/2017  . CLOSED REDUCTION SHOULDER DISLOCATION    . COLONOSCOPY WITH PROPOFOL N/A 12/22/2018   Procedure: COLONOSCOPY WITH PROPOFOL;  Surgeon:  Jonathon Bellows, MD;  Location: [facility] ENDOSCOPY;  Service: Gastroenterology;  Laterality: N/A;  . CORONARY ANGIOPLASTY  04/05/2014   stent placement OM 1  . CORONARY ARTERY BYPASS GRAFT    . CORONARY STENT PLACEMENT  7/12   2 stents--Promus element plus (everolimus eluting)--[facility] in [place]  . LEFT HEART CATH AND CORS/GRAFTS ANGIOGRAPHY N/A 10/28/2017   Procedure: LEFT HEART CATH AND CORS/GRAFTS ANGIOGRAPHY;  Surgeon: Leonie Man, MD;  Location: [place] CV LAB;  Service: Cardiovascular;  Laterality: N/A;  . LEFT HEART CATHETERIZATION WITH CORONARY/GRAFT ANGIOGRAM N/A 01/04/2012   Procedure: LEFT HEART CATHETERIZATION WITH Beatrix Fetters;  Surgeon: Burnell Blanks, MD;  Location[facility] CATH LAB;  Service: Cardiovascular;  Laterality: N/A;  . LEFT HEART CATHETERIZATION WITH CORONARY/GRAFT ANGIOGRAM N/A 04/05/2014   Procedure: LEFT HEART CATHETERIZATION WITH Beatrix Fetters;  Surgeon: Jettie Booze, MD;  Location: [facility] CATH LAB;  Service: Cardiovascular;  Laterality: N/A;  . PERCUTANEOUS CORONARY STENT INTERVENTION (PCI-S)  04/05/2014   Procedure: PERCUTANEOUS CORONARY STENT INTERVENTION (PCI-S);  Surgeon: Jettie Booze, MD;  Location: [facility] CATH LAB;  Service: Cardiovascular;;  OM1  . RENAL ANGIOGRAM N/A 04/16/2011   Procedure: RENAL ANGIOGRAM;  Surgeon: Burnell Blanks, MD;  Location: [facility] CATH LAB;  Service: Cardiovascular;  Laterality: N/A;  . RENAL ARTERY STENT  03/2011 ?    Family History  Problem Relation Age of Onset  . Coronary artery disease Mother        Died MI age 59  . Hypertension Mother   . Heart attack Mother   . Coronary artery disease Sister        Living  . Coronary artery disease Brother        Living  . Coronary artery disease Father        Died MI age 40  . Heart attack Father   . Heart attack Maternal Grandfather   . Diabetes Neg Hx   . Cancer Neg Hx        prostate or colon    Social History    Socioeconomic History  . Marital status: Married    Spouse name: Not on file  . Number of children: 3  . Years of education: Not on file  . Highest education level: Not on file  Occupational History  . Occupation: Geneticist, molecular (Animal nutritionist)    Comment: Retired  Tobacco Use  . Smoking status: Former Smoker    Packs/day: 3.00    Years: 25.00    Pack years: 75.00    Types: Cigarettes    Quit date: 05/28/1993    Years since quitting: 26.5  . Smokeless tobacco: Never Used  Vaping Use  . Vaping Use: Never used  Substance and Sexual Activity  . Alcohol use: Yes    Alcohol/week: 42.0 standard drinks    Types: 42 Cans of beer per week  . Drug use: No  . Sexual activity: Not on file  Other Topics Concern  . Not on file  Social History Narrative   No living will   No health care POA but requests wife--then children   Would accept resuscitation   Not sure about tube feeds   Social Determinants of Health   Financial Resource Strain:   . Difficulty of Paying Living Expenses:   Food Insecurity:   . Worried About Charity fundraiser in the Last Year:   . Arboriculturist in the Last Year:   Transportation Needs:   . Film/video editor (Medical):   Marland Kitchen Lack of Transportation (Non-Medical):   Physical Activity:   . Days of Exercise per Week:   . Minutes of Exercise per Session:   Stress:   . Feeling of Stress :   Social Connections:   . Frequency of Communication with Friends and Family:   . Frequency of Social Gatherings with Friends and Family:   . Attends Religious Services:   . Active Member of Clubs or Organizations:   . Attends Archivist Meetings:   Marland Kitchen Marital Status:   Intimate Partner Violence:   . Fear of Current or Ex-Partner:   . Emotionally Abused:   Marland Kitchen Physically Abused:   . Sexually Abused:    Review of Systems No fever No N/V Some dizziness Able to eat No cough No change in usual breathing issues    Objective:   Physical  Exam Constitutional:      Comments: Moves slowly and mild obvious pain  Pulmonary:     Effort: Pulmonary effort is normal.     Breath sounds: Normal breath sounds. No wheezing or rales.  Abdominal:     General: Bowel sounds are normal.     Tenderness: There is abdominal tenderness in the left lower quadrant. There is no rebound.     Comments: Very tender along left flank--but no rebound and no peritoneal signs  Neurological:     Mental Status: He is alert.            Assessment & Plan:

## 2019-12-06 NOTE — Telephone Encounter (Signed)
Patient insisting that a message be sent to Dr Silvio Pate. Patient stated is it possible send some antibiotics  for diverticulitis. Please advise patient.

## 2019-12-22 NOTE — Progress Notes (Signed)
Date:  12/24/2019   ID:  [name], DOB Oct 05, 1950, MRN [id number]  Patient Location:  [address] [address]   Provider location:   [facility], [place] office  PCP:  Venia Carbon, MD  Cardiologist:  [facility]   Chief Complaint  Patient presents with  . other    12 month follow up. Meds reviewed by the pt. verbally. "doing well."     History of Present Illness:    [name] is a 69 y.o. male  past medical history of coronary artery disease, bypass surgery in 1995,  stenting at [facility] in February 2004 for MI with a 3.0 x 25 mm and 4.5 x 16 mm Monorail ( location uncertain), also 4.5 x 18 mm stent placed to the mid RCA, followup with repeat stenting in April 2005 to the proximal LAD with a Taxus stent 2.5 x 8 mm, and stenting to the proximal left circumflex with a 2.5 x 12 mm Taxus, with  stent placed November 13, 2010 with a 4.0 x 9 mm stent placed to the left main. renal artery stent. Abdominal aortic aneurysm 3.2 cm smoker though stopped in 1995. Prior to that he smoked 3 packs per day. Smoked for approximately 25-28 years 4.1 cm AAA on CT scan 10/2017  He presents for routine followup of his coronary artery disease.  Last seen by telemetry visit August 2020 Very active at baseline, spends time on his tractor pulled muscle in right chest missing with retractor  Recent episode of Diverticulitis, Abx, amox,  4 days of sx, now better  Was swimming, pool broke in his backyard, washed out Now no regular exercise program  Lab work reviewed Done in the past year LDL 62 in June 2020 Sodium 131 last year Scheduled to have new lab work look underneath my  Recent ultrasound no change in his aorta  EKG personally reviewed by myself on todays visit NSR rate 65 bpm, no ST or T wave changes  Aorta u/s 10/2018 Stable size, 4.2 cm  Unchanged, discussed  Could not breath,called EMS 100% oxygen,    Cardiac cath 10/25/2017 No stents  Known Severe Multi-vessel CAD: known CTO of ostRCA, mLAD & SVG-AM-PDA, SVG-OM.  Widely patent LIMA-LAD - retograde fills to 100% CTO, antegrade flow brisk to the Apex.  Prox LM ~40% (stable lesion just prior to the stent) followed by widely patent stents running from pLM-ost-proxCx-OM with brisk flow in both the distal OM & AV Groove Cx --> collaterals to RPL system.  Mldly reduced LVEF with basal-mid Inferior Akinesis.  Normal/Low LVEDP.  Seen by pulmonary, Started on lasix daily, Felt better   chest pain 04/05/2014, went to [facility] head catheterization. This showed an occluded OM vessel with DES placed 2 Occluded mid LAD with a patent LIMA to the LAD, patent left circumflex, prior bare-metal stent into the OM1 was occluded in the distal portion just after the ostium of OM1. Occluded RCA, ejection fraction 35%  Previous episode of chest pain while in [place] with hospitalization at that time, negative stress test, 2013 recurrent chest pain requiring admission to [facility], repeat catheterization performed 12/2011 This showed patent stents to the left circumflex and LAD with patent LIMA to the LAD. Vein graft to the OM and vein graft to the RCA was occluded  admission to  at the beginning of July 2014. Notes from the hospital were reviewed. Ruled out  for MI with negative cardiac enzymes, CT scan chest showing no PE. He had stress test 10/25/2012 showing old inferior wall MI with defect noted in the basal to mid inferior wall. Discharged on tramadol.   Ultrasound of his aorta in [place] 12/22/2011 showed distal aorta 3 cm, iliacs of normal size Previous lower extremity ultrasound  showed no significant disease  He used to be a smoker though stopped in 1995. Prior to that he smoked 3 packs per day. Smoked for approximately 25-28 years   Prior CV studies:   The following studies were reviewed today:    Past Medical History:   Diagnosis Date  . AAA (abdominal aortic aneurysm) ([place])    a. Duplex 01/2012: stable infrarenal saccular AAA at 3.3cm x 3.2xm (f/u recommended 01/2013 per Dr. Rockey Situ)  . Alcohol abuse   . CAD (coronary artery disease)    a. 1995 s/p CABG x 4: LIMA->LAD, VG->RI (known to be occluded), VG->AM->PDA;  b. 05/2002 Inf STEMI: VG->AM->PDA 100% treated w/ 2 BMS complicated by acute thrombosis req 3 BMS;  c. 07/2003 DES to  LAD & LCX (VG's to PDA & RI 100%);  d. 12/2010 Acute [place] ([place] DES to LCX & LM , LIMA ok,;  e.12/2011 Cath: LM/LCX stents ok , LIMA patent.  . Cardiomyopathy ([place])    a. EF 35% by cath 2015.  . Diverticulitis    1/06 Diverticulitis--CT of pelvis--diffuse sigmoid divertic  . Dyspnea   . GERD (gastroesophageal reflux disease)   . Hyperlipidemia   . Hypertension   . Myocardial infarction ([place])   . PAD (peripheral artery disease) ([facility])    a. external iliac and mesenteric stenosis noted by noninvasive imaging.  . Renal artery stenosis ([place])    a. 03/2011 PTA and stenting of L RA. b. last duplex 2016 with stable 1-59% bilateral RAS, incidental >50% R EIA stenosis   Past Surgical History:  Procedure Laterality Date  . ANGIOPLASTY  1997  . CARDIAC CATHETERIZATION     8/09  Cath--vein graft occlusions which are old--no acute changes  . CARDIAC CATHETERIZATION  11/13/2010   stent x 2 @ [place]  . CARDIAC CATHETERIZATION  04/05/2014   stent placement   . CARDIAC CATHETERIZATION  10/28/2017  . CLOSED REDUCTION SHOULDER DISLOCATION    . COLONOSCOPY WITH PROPOFOL N/A 12/22/2018   Procedure: COLONOSCOPY WITH PROPOFOL;  Surgeon: Jonathon Bellows, MD;  Location: [facility] ENDOSCOPY;  Service: Gastroenterology;  Laterality: N/A;  . CORONARY ANGIOPLASTY  04/05/2014   stent placement OM 1  . CORONARY ARTERY BYPASS GRAFT    . CORONARY STENT PLACEMENT  7/12   2 stents--Promus element plus (everolimus eluting)--[facility] in [place]  . LEFT HEART CATH AND CORS/GRAFTS ANGIOGRAPHY N/A 10/28/2017    Procedure: LEFT HEART CATH AND CORS/GRAFTS ANGIOGRAPHY;  Surgeon: Leonie Man, MD;  Location: [place] CV LAB;  Service: Cardiovascular;  Laterality: N/A;  . LEFT HEART CATHETERIZATION WITH CORONARY/GRAFT ANGIOGRAM N/A 01/04/2012   Procedure: LEFT HEART CATHETERIZATION WITH Beatrix Fetters;  Surgeon: Burnell Blanks, MD;  Location: [facility] CATH LAB;  Service: Cardiovascular;  Laterality: N/A;  . LEFT HEART CATHETERIZATION WITH CORONARY/GRAFT ANGIOGRAM N/A 04/05/2014   Procedure: LEFT HEART CATHETERIZATION WITH Beatrix Fetters;  Surgeon: Jettie Booze, MD;  Location: [facility] CATH LAB;  Service: Cardiovascular;  Laterality: N/A;  . PERCUTANEOUS CORONARY STENT INTERVENTION (PCI-S)  04/05/2014   Procedure: PERCUTANEOUS CORONARY STENT INTERVENTION (PCI-S);  Surgeon: Jettie Booze, MD;  Location: [facility] CATH LAB;  Service: Cardiovascular;;  OM1  . RENAL ANGIOGRAM N/A 04/16/2011   Procedure: RENAL ANGIOGRAM;  Surgeon: Burnell Blanks, MD;  Location: [facility] CATH LAB;  Service: Cardiovascular;  Laterality: N/A;  . RENAL ARTERY STENT  03/2011 ?     Allergies:   Metoprolol tartrate and Metoclopramide   Social History   Tobacco Use  . Smoking status: Former Smoker    Packs/day: 3.00    Years: 25.00    Pack years: 75.00    Types: Cigarettes    Quit date: 05/28/1993    Years since quitting: 26.5  . Smokeless tobacco: Never Used  Vaping Use  . Vaping Use: Never used  Substance Use Topics  . Alcohol use: Yes    Alcohol/week: 42.0 standard drinks    Types: 42 Cans of beer per week  . Drug use: No     Current Outpatient Medications on File Prior to Visit  Medication Sig Dispense Refill  . aspirin 81 MG tablet Take 81 mg by mouth daily.    . clopidogrel (PLAVIX) 75 MG tablet Take 1 tablet (75 mg total) by mouth daily. 90 tablet 3  . ezetimibe (ZETIA) 10 MG tablet TAKE 1 TABLET BY MOUTH EVERY DAY 90 tablet 3  . HYDROcodone-acetaminophen (NORCO/VICODIN) 5-325 MG  tablet Take 1 tablet by mouth every 4 (four) hours as needed for moderate pain. 20 tablet 0  . isosorbide mononitrate (IMDUR) 30 MG 24 hr tablet TAKE HALF TABLETS (15 MG TOTAL) BY MOUTH AT BEDTIME. 45 tablet 3  . lisinopril (ZESTRIL) 5 MG tablet TAKE 1 TABLET BY MOUTH EVERY DAY 90 tablet 3  . mirtazapine (REMERON) 15 MG tablet TAKE 1 TABLET BY MOUTH EVERYDAY AT BEDTIME 90 tablet 3  . naproxen sodium (ALEVE) 220 MG tablet Take 220 mg by mouth daily as needed (pain).    . nitroGLYCERIN (NITROSTAT) 0.4 MG SL tablet Place 1 tablet (0.4 mg total) under the tongue every 5 (five) minutes as needed for chest pain. 25 tablet 2  . omeprazole (PRILOSEC) 20 MG capsule Take 20 mg by mouth daily.    . rosuvastatin (CRESTOR) 40 MG tablet TAKE 1 TABLET BY MOUTH EVERY DAY 90 tablet 0   No current facility-administered medications on file prior to visit.     Family Hx: The patient's family history includes Coronary artery disease in his brother, father, mother, and sister; Heart attack in his father, maternal grandfather, and mother; Hypertension in his mother. There is no history of Diabetes or Cancer.  ROS:   Please see the history of present illness.    Review of Systems  Constitutional: Negative.   HENT: Negative.   Respiratory: Negative.   Cardiovascular: Negative.   Gastrointestinal: Negative.   Musculoskeletal: Negative.   Neurological: Negative.   Psychiatric/Behavioral: Negative.   All other systems reviewed and are negative.    Labs/Other Tests and Data Reviewed:    Recent Labs: No results found for requested labs within last 8760 hours.   Recent Lipid Panel Lab Results  Component Value Date/Time   CHOL 169 10/19/2018 02:05 PM   CHOL 163 08/11/2015 09:41 AM   TRIG (H) 10/19/2018 02:05 PM    492.0 Triglyceride is over 400; calculations on Lipids are invalid.   HDL 44.30 10/19/2018 02:05 PM   HDL 46 08/11/2015 09:41 AM   CHOLHDL 4 10/19/2018 02:05 PM   LDLCALC 24 10/29/2017 02:33 AM    LDLCALC 63 01/07/2017 11:20 AM   LDLDIRECT 62.0 10/19/2018 02:05 PM    Wt Readings from Last  3 Encounters:  12/24/19 163 lb 8 oz (74.2 kg)  12/06/19 165 lb (74.8 kg)  02/16/19 159 lb (72.1 kg)     Exam:    Vital Signs: Vital signs may also be detailed in the HPI BP 140/88 (BP Location: Left Arm, Patient Position: Sitting, Cuff Size: Normal)   Pulse 65   Ht 5\' 4"  (1.626 m)   Wt 163 lb 8 oz (74.2 kg)   SpO2 96%   BMI 28.06 kg/m   Constitutional:  oriented to person, place, and time. No distress.  HENT:  Head: Grossly normal Eyes:  no discharge. No scleral icterus.  Neck: No JVD, no carotid bruits  Cardiovascular: Regular rate and rhythm, no murmurs appreciated Pulmonary/Chest: Clear to auscultation bilaterally, no wheezes or rails Abdominal: Soft.  no distension.  no tenderness.  Musculoskeletal: Normal range of motion Neurological:  normal muscle tone. Coordination normal. No atrophy Skin: Skin warm and dry Psychiatric: normal affect, pleasant   ASSESSMENT & PLAN:    Problem List Items Addressed This Visit      Cardiology Problems   Coronary artery disease involving native coronary artery of native heart with angina pectoris ([place]) - Primary   Relevant Medications   carvedilol (COREG) 3.125 MG tablet   Other Relevant Orders   EKG 12-Lead   Heart failure, diastolic, chronic (H[facility])   Relevant Medications   carvedilol (COREG) 3.125 MG tablet   Renal artery stenosis ([facility])   Relevant Medications   carvedilol (COREG) 3.125 MG tablet   Peripheral vascular disease (HCC)   Relevant Medications   carvedilol (COREG) 3.125 MG tablet   Other Relevant Orders   EKG 12-Lead   Orthostasis   Relevant Medications   carvedilol (COREG) 3.125 MG tablet   Essential hypertension   Relevant Medications   carvedilol (COREG) 3.125 MG tablet   AAA (abdominal aortic aneurysm) without rupture (HCC)   Relevant Medications   carvedilol (COREG) 3.125 MG tablet   Hyperlipidemia LDL goal  <70   Relevant Medications   carvedilol (COREG) 3.125 MG tablet      CAD Currently with no symptoms of angina. No further workup at this time. Continue current medication regimen.  AAA stable 4.2 cm  Repeat ultrasound, stable  ETOH Cessation recommended  Essential hypertension Blood pressure stable, no changes made  Hyperlipidemia Cholesterol at goal, no changes made We will wait for new lab work   Disposition: Follow-up in 12 months   Signed, Ida Rogue, MD  [place] Office [address] [place] [address], [place], [place] [address]

## 2019-12-24 ENCOUNTER — Ambulatory Visit (INDEPENDENT_AMBULATORY_CARE_PROVIDER_SITE_OTHER): Payer: Medicare Other | Admitting: Cardiovascular Disease

## 2019-12-24 ENCOUNTER — Encounter: Payer: Self-pay | Admitting: Cardiovascular Disease

## 2019-12-24 ENCOUNTER — Other Ambulatory Visit: Payer: Self-pay

## 2019-12-24 VITALS — BP 140/88 | HR 65 | Ht 64.0 in | Wt 163.5 lb

## 2019-12-24 DIAGNOSIS — I951 Orthostatic hypotension: Secondary | ICD-10-CM

## 2019-12-24 DIAGNOSIS — I701 Atherosclerosis of renal artery: Secondary | ICD-10-CM | POA: Diagnosis not present

## 2019-12-24 DIAGNOSIS — I25119 Atherosclerotic heart disease of native coronary artery with unspecified angina pectoris: Secondary | ICD-10-CM

## 2019-12-24 DIAGNOSIS — I714 Abdominal aortic aneurysm, without rupture, unspecified: Secondary | ICD-10-CM

## 2019-12-24 DIAGNOSIS — I739 Peripheral vascular disease, unspecified: Secondary | ICD-10-CM

## 2019-12-24 DIAGNOSIS — E785 Hyperlipidemia, unspecified: Secondary | ICD-10-CM | POA: Diagnosis not present

## 2019-12-24 DIAGNOSIS — I5032 Chronic diastolic (congestive) heart failure: Secondary | ICD-10-CM | POA: Diagnosis not present

## 2019-12-24 DIAGNOSIS — I10 Essential (primary) hypertension: Secondary | ICD-10-CM | POA: Diagnosis not present

## 2019-12-24 MED ORDER — CARVEDILOL 3.125 MG PO TABS: 3.1250 mg | ORAL_TABLET | Freq: Two times a day (BID) | ORAL | 3 refills | Status: DC

## 2019-12-24 NOTE — Patient Instructions (Signed)
Medication Instructions:  No changes  If you need a refill on your cardiac medications before your next appointment, please call your pharmacy.    Lab work: No new labs needed   If you have labs (blood work) drawn today and your tests are completely normal, you will receive your results only by: . MyChart Message (if you have MyChart) OR . A paper copy in the mail If you have any lab test that is abnormal or we need to change your treatment, we will call you to review the results.   Testing/Procedures: No new testing needed   Follow-Up: At [facility], you and your health needs are our priority.  As part of our continuing mission to provide you with exceptional heart care, we have created designated Provider Care Teams.  These Care Teams include your primary Cardiologist (physician) and Advanced Practice Providers (APPs -  Physician Assistants and Nurse Practitioners) who all work together to provide you with the care you need, when you need it.  . You will need a follow up appointment in 12 months  . Providers on your designated Care Team:   . Christopher Berge, NP . Ryan Dunn, PA-C . Jacquelyn Visser, PA-C  Any Other Special Instructions Will Be Listed Below (If Applicable).  COVID-19 Vaccine Information can be found at: [email or web address] For questions related to vaccine distribution or appointments, please email [email or web address] or call [phone].     

## 2020-01-14 ENCOUNTER — Telehealth: Payer: Self-pay | Admitting: Cardiovascular Disease

## 2020-01-14 ENCOUNTER — Telehealth: Payer: Self-pay | Admitting: *Deleted

## 2020-01-14 NOTE — Telephone Encounter (Signed)
Spoke with patients wife per release form and she reports blood pressure normally runs 124/80's. She states that they did call primary care provider and was advised to hold lisinopril and give [place] a call. She states today was 100/60 and then at one point it was 90/52. Advised to please continue holding lisinopril and monitor blood pressures 2 hours after morning medications. She states that he has not been eating well and inquired about his fluid intake and she has encouraged other options such as milk and ensure. Encouraged to try and increase his hydration with water in addition to the milk and ensure he is drinking now. Requested that she call me back in 3 days with his readings and to call sooner if they persistently stay lower than his current readings. She verbalized understanding of our conversation, agreement with plan, and had no further questions at this time.

## 2020-01-14 NOTE — Telephone Encounter (Signed)
Pt c/o BP issue: STAT if pt c/o blurred vision, one-sided weakness or slurred speech  1. What are your last 5 BP readings? 100/60  2. Are you having any other symptoms (ex. Dizziness, headache, blurred vision, passed out)? Dizziness / lightheaded   3. What is your BP issue? BP is running low   PCP told patient to hold lisinopril until he speak with cardiologist

## 2020-01-14 NOTE — Telephone Encounter (Signed)
Patient's wife called stating that her husband's blood pressure has been averaging around 100/60 for the last couple of days and has been a little lower at times. Patient's wife stated that he does not have much of an appetite so she is having him drink ensure. Patient's wife stated that his last blood pressure reading was 100/68. Patient's wife stated that he is a little dizzy because of his blood pressure being low. Patient's wife stated that he sees Dr. Rockey Situ and he prescribes a lot of his medications. Advised patient's wife that Dr. Silvio Pate is out of the office today. Advised Mrs. Elms she should reach out to his cardiologist to see if he wants to change any of his medications. After speaking to Webb Silversmith NP patient's wife was advised to have her husband hold his Lisinopril until they can get in touch with his cardiologist. Patient's wife advised that her husbands last heart rate reading was 52. Mrs. Bacchi stated that she is on hold with cardiology now.Durward Fortes Mrs. Osorto that this message will go back to Dr. Silvio Pate.

## 2020-01-15 NOTE — Telephone Encounter (Signed)
Dr Donivan Scull office did give advice. May need to adjust medications further (imdur or carvedilol likely) if the dizziness and fatigue persist

## 2020-01-16 NOTE — Telephone Encounter (Signed)
Okay to hold lisinopril, would continue to monitor pressure

## 2020-01-16 NOTE — Telephone Encounter (Signed)
Patient wife calling with update and advise on meds   Pt c/o medication issue:  1. Name of Medication: Lisinopril  2. How are you currently taking this medication (dosage and times per day)? holding  3. Are you having a reaction (difficulty breathing--STAT)? no  4. What is your medication issue?  See note below patient calling with update  Recent bp : 110/76 129/80 120/82 109/65 Interim Dizziness is noted when bp drops lower

## 2020-01-16 NOTE — Telephone Encounter (Signed)
Spoke with patients wife per release form and reviewed blood pressure readings that they provided. Inquired about hydration when having those low readings. She states his first BP reading this morning was 109/65 and that he just rechecked it and it was 132/97 but felt that was due to anxiety from noticing the lower reading previously. Instructed her to have him continue holding the lisinopril and monitor blood pressures over the weekend and call back with additional readings for Dr. Rockey Situ to review. She was very appreciative for the call back with no further questions at this time.

## 2020-01-18 ENCOUNTER — Ambulatory Visit: Payer: Medicare Other | Admitting: Family

## 2020-01-18 ENCOUNTER — Encounter (HOSPITAL_COMMUNITY): Payer: Self-pay | Admitting: Emergency Medicine

## 2020-01-18 ENCOUNTER — Emergency Department (HOSPITAL_COMMUNITY)
Admission: EM | Admit: 2020-01-18 | Discharge: 2020-01-19 | Disposition: A | Discharge status: Home / Self Care | Payer: Medicare Other | Attending: Emergency Medicine | Admitting: Emergency Medicine

## 2020-01-18 ENCOUNTER — Emergency Department (HOSPITAL_COMMUNITY): Payer: Medicare Other

## 2020-01-18 DIAGNOSIS — Z7902 Long term (current) use of antithrombotics/antiplatelets: Secondary | ICD-10-CM | POA: Insufficient documentation

## 2020-01-18 DIAGNOSIS — I2510 Atherosclerotic heart disease of native coronary artery without angina pectoris: Secondary | ICD-10-CM | POA: Insufficient documentation

## 2020-01-18 DIAGNOSIS — I10 Essential (primary) hypertension: Secondary | ICD-10-CM | POA: Diagnosis not present

## 2020-01-18 DIAGNOSIS — Z79899 Other long term (current) drug therapy: Secondary | ICD-10-CM | POA: Diagnosis not present

## 2020-01-18 DIAGNOSIS — I5032 Chronic diastolic (congestive) heart failure: Secondary | ICD-10-CM | POA: Insufficient documentation

## 2020-01-18 DIAGNOSIS — Z951 Presence of aortocoronary bypass graft: Secondary | ICD-10-CM | POA: Diagnosis not present

## 2020-01-18 DIAGNOSIS — R0789 Other chest pain: Secondary | ICD-10-CM | POA: Insufficient documentation

## 2020-01-18 DIAGNOSIS — I110 Hypertensive heart disease with heart failure: Secondary | ICD-10-CM | POA: Insufficient documentation

## 2020-01-18 DIAGNOSIS — J449 Chronic obstructive pulmonary disease, unspecified: Secondary | ICD-10-CM | POA: Diagnosis not present

## 2020-01-18 DIAGNOSIS — Z79891 Long term (current) use of opiate analgesic: Secondary | ICD-10-CM | POA: Insufficient documentation

## 2020-01-18 DIAGNOSIS — Z7982 Long term (current) use of aspirin: Secondary | ICD-10-CM | POA: Diagnosis not present

## 2020-01-18 DIAGNOSIS — R202 Paresthesia of skin: Secondary | ICD-10-CM | POA: Diagnosis not present

## 2020-01-18 DIAGNOSIS — R55 Syncope and collapse: Secondary | ICD-10-CM | POA: Diagnosis not present

## 2020-01-18 DIAGNOSIS — R079 Chest pain, unspecified: Secondary | ICD-10-CM | POA: Diagnosis not present

## 2020-01-18 DIAGNOSIS — Z955 Presence of coronary angioplasty implant and graft: Secondary | ICD-10-CM | POA: Diagnosis not present

## 2020-01-18 DIAGNOSIS — R42 Dizziness and giddiness: Secondary | ICD-10-CM | POA: Diagnosis not present

## 2020-01-18 LAB — BASIC METABOLIC PANEL
Anion gap: 9 (ref 5–15)
BUN: 6 mg/dL — ABNORMAL LOW (ref 8–23)
CO2: 24 mmol/L (ref 22–32)
Calcium: 9 mg/dL (ref 8.9–10.3)
Chloride: 100 mmol/L (ref 98–111)
Creatinine, Ser: 0.72 mg/dL (ref 0.61–1.24)
GFR calc Af Amer: >60 mL/min (ref >60)
GFR calc non Af Amer: >60 mL/min (ref >60)
Glucose, Bld: 110 mg/dL — ABNORMAL HIGH (ref 70–99)
Potassium: 4.3 mmol/L (ref 3.5–5.1)
Sodium: 133 mmol/L — ABNORMAL LOW (ref 135–145)

## 2020-01-18 LAB — CBC
HCT: 39 % (ref 39.0–52.0)
Hemoglobin: 13.1 g/dL (ref 13.0–17.0)
MCH: 32.3 pg (ref 26.0–34.0)
MCHC: 33.6 g/dL (ref 30.0–36.0)
MCV: 96.1 fL (ref 80.0–100.0)
Platelets: 305 10*3/uL (ref 150–400)
RBC: 4.06 MIL/uL — ABNORMAL LOW (ref 4.22–5.81)
RDW: 14.6 % (ref 11.5–15.5)
WBC: 6.5 10*3/uL (ref 4.0–10.5)
nRBC: 0 % (ref 0.0–0.2)

## 2020-01-18 LAB — TROPONIN I (HIGH SENSITIVITY)
Troponin I (High Sensitivity): 7 ng/L (ref <18)
Troponin I (High Sensitivity): 8 ng/L (ref <18)

## 2020-01-18 NOTE — Telephone Encounter (Signed)
Patient wife calling back to report BP today is 192/132 and 173/112

## 2020-01-18 NOTE — Telephone Encounter (Signed)
Patient is not feeling well, coming in to see [place] today

## 2020-01-18 NOTE — Telephone Encounter (Signed)
This morning patient and wife report blood pressure as 192/8, HR 67. Patient has not had lisinopril since Monday. Denies headache, blurred vision or chest pain. Advised him to go ahead and take the lisinopril and recheck BP in about 2 hours.  If BP has not decreased then to call us back this afternoon. Patient offered visit this afternoon but refused as they are not able to come in today. Offered Monday and prefer dr Rockey Situ.  Scheduled with Dr Rockey Situ on Tuesday, 01/22/20. They will call if BP does not improve.

## 2020-01-18 NOTE — ED Triage Notes (Signed)
Pt arrives via [facility], states pt started having sudden onset of chest pain, hx of MI x2, states this pain feels different. Ems ekg unremarkable other than PVCs noted. Pt endorses some issues with hypotension over the past few days, bp in th 80s so pcp stopped his lisinopril and today bp was elevated in the 180s. Received 324mg  of asa and 2 sl nitro. 18g RAC. Also endorses some dizziness x1 week.

## 2020-01-19 DIAGNOSIS — R0789 Other chest pain: Secondary | ICD-10-CM | POA: Diagnosis not present

## 2020-01-19 LAB — TROPONIN I (HIGH SENSITIVITY): Troponin I (High Sensitivity): 7 ng/L (ref <18)

## 2020-01-19 NOTE — ED Provider Notes (Signed)
[facility] EMERGENCY DEPARTMENT Provider Note  CSN: [id number] Arrival date & time: 01/18/20 1348  Chief Complaint(s) Chest Pain  HPI [name] is a 69 y.o. male here for near syncope.  HPI Patient reports that around 1145 this morning he had a sudden sensation of feeling bad and lightheadedness.  This occurred shortly after he carried lumbar to his truck.  He was sitting in his truck when the episode occurred.  He denied completely passing out but reports his family told him that he was pale.  This prompted a call to EMS.  Patient denied any remembering having any chest pain during the episode however when they spoke with EMS, he was feeling a slight substernal chest pain.  He reports that this chest pain is similar to prior episodes that were not related to his prior MIs.  Patient is still having the discomfort.  It is nonexertional and nonradiating.  Not associated with any shortness of breath.  No nausea or vomiting.  Patient denied feeling any palpitations or tachycardia during the episode.  He does report a history of orthostasis.  Reports that he was told to stop taking his lisinopril last week by his cardiologist after he had a similar episode and noted that his blood pressures were 90s/50s.  During today's episode, the patient reported that they checked his blood pressure and it was 190s.  Patient denies any other physical complaints.  He denies any recent fevers or infections.  I did note that last month he had a bout of diverticulitis which was treated with antibiotics.  Reports that he has not had any more diarrhea since.   Past Medical History Past Medical History:  Diagnosis Date  . AAA (abdominal aortic aneurysm) ([place])    a. Duplex 01/2012: stable infrarenal saccular AAA at 3.3cm x 3.2xm (f/u recommended 01/2013 per Dr. Rockey Situ)  . Alcohol abuse   . CAD (coronary artery disease)    a. 1995 s/p CABG x 4: LIMA->LAD, VG->RI (known to be occluded),  VG->AM->PDA;  b. 05/2002 Inf STEMI: VG->AM->PDA 100% treated w/ 2 BMS complicated by acute thrombosis req 3 BMS;  c. 07/2003 DES to  LAD & LCX (VG's to PDA & RI 100%);  d. 12/2010 Acute MI ([place]): DES to LCX & LM , LIMA ok,;  e.12/2011 Cath: LM/LCX stents ok , LIMA patent.  . Cardiomyopathy ([place])    a. EF 35% by cath 2015.  . Diverticulitis    1/06 Diverticulitis--CT of pelvis--diffuse sigmoid divertic  . Dyspnea   . GERD (gastroesophageal reflux disease)   . Hyperlipidemia   . Hypertension   . Myocardial infarction ([place])   . PAD (peripheral artery disease) ([facility])    a. external iliac and mesenteric stenosis noted by noninvasive imaging.  . Renal artery stenosis ([place])    a. 03/2011 PTA and stenting of L RA. b. last duplex 2016 with stable 1-59% bilateral RAS, incidental >50% R EIA stenosis   Patient Active Problem List   Diagnosis Date Noted  . IBS (irritable bowel syndrome)   . Polyp of descending colon   . Early satiety 10/19/2018  . Rectal urgency 10/19/2018  . Heart failure, diastolic, chronic ([place]) 37/16/9678  . COPD (chronic obstructive pulmonary disease) ([place]) 02/09/2018  . Peripheral vascular disease ([place]) 01/07/2017  . Orthostasis 08/19/2016  . Advance directive discussed with patient 01/05/2016  . Acute diverticulitis 01/06/2015  . CAD (coronary artery disease) of artery bypass graft 04/04/2014  . Routine general medical examination at a  health care facility 12/19/2013  . AAA (abdominal aortic aneurysm) without rupture ([place]) 09/20/2013  . History of MI (myocardial infarction) 09/20/2013  . Renal artery stenosis ([place]) 04/01/2011  . Sleep disorder 12/30/2010  . Hyperlipidemia LDL goal <70 12/21/2007  . Essential hypertension 12/21/2007  . Coronary artery disease involving native coronary artery of native heart with angina pectoris ([place]) 12/21/2007  . GERD 12/21/2007  . DIVERTICULAR DISEASE 05/18/2004   Home Medication(s) Prior to Admission medications   Medication Sig Start  Date End Date Taking? Authorizing Provider  aspirin 81 MG tablet Take 81 mg by mouth daily.   Yes [provider]  BLACK ELDERBERRY PO Take 1 each by mouth daily.   Yes [provider]  carvedilol (COREG) 3.125 MG tablet Take 1 tablet (3.125 mg total) by mouth 2 (two) times daily. 12/24/19  Yes Minna Merritts, MD  clopidogrel (PLAVIX) 75 MG tablet Take 1 tablet (75 mg total) by mouth daily. 10/11/19  Yes Gollan, Kathlene November, MD  ezetimibe (ZETIA) 10 MG tablet TAKE 1 TABLET BY MOUTH EVERY DAY Patient taking differently: Take 10 mg by mouth daily.  05/23/19  Yes Gollan, Kathlene November, MD  HYDROcodone-acetaminophen (NORCO/VICODIN) 5-325 MG tablet Take 1 tablet by mouth every 4 (four) hours as needed for moderate pain. 12/06/19  Yes Venia Carbon, MD  isosorbide mononitrate (IMDUR) 30 MG 24 hr tablet TAKE HALF TABLETS (15 MG TOTAL) BY MOUTH AT BEDTIME. Patient taking differently: Take 30 mg by mouth at bedtime.  03/09/19  Yes Gollan, Kathlene November, MD  lisinopril (ZESTRIL) 5 MG tablet TAKE 1 TABLET BY MOUTH EVERY DAY Patient taking differently: Take 5 mg by mouth daily.  10/12/19  Yes Gollan, Kathlene November, MD  mirtazapine (REMERON) 15 MG tablet TAKE 1 TABLET BY MOUTH EVERYDAY AT BEDTIME Patient taking differently: Take 15 mg by mouth at bedtime.  09/05/19  Yes Venia Carbon, MD  Multiple Vitamins-Minerals (MULTIVITAMIN WITH MINERALS) tablet Take 1 tablet by mouth daily.   Yes [provider]  naproxen sodium (ALEVE) 220 MG tablet Take 220 mg by mouth daily as needed (pain).   Yes [provider]  nitroGLYCERIN (NITROSTAT) 0.4 MG SL tablet Place 1 tablet (0.4 mg total) under the tongue every 5 (five) minutes as needed for chest pain. 02/16/19  Yes Venia Carbon, MD  omeprazole (PRILOSEC) 20 MG capsule Take 20 mg by mouth daily.   Yes [provider]  rosuvastatin (CRESTOR) 40 MG tablet TAKE 1 TABLET BY MOUTH EVERY DAY Patient taking differently: Take 40 mg by  mouth daily.  11/29/19  Yes Minna Merritts, MD                                                                                                                                    Past Surgical History Past Surgical History:  Procedure Laterality Date  . ANGIOPLASTY  1997  . CARDIAC CATHETERIZATION  8/09  Cath--vein graft occlusions which are old--no acute changes  . CARDIAC CATHETERIZATION  11/13/2010   stent x 2 @ [place]  . CARDIAC CATHETERIZATION  04/05/2014   stent placement   . CARDIAC CATHETERIZATION  10/28/2017  . CLOSED REDUCTION SHOULDER DISLOCATION    . COLONOSCOPY WITH PROPOFOL N/A 12/22/2018   Procedure: COLONOSCOPY WITH PROPOFOL;  Surgeon: Jonathon Bellows, MD;  Location: [facility] ENDOSCOPY;  Service: Gastroenterology;  Laterality: N/A;  . CORONARY ANGIOPLASTY  04/05/2014   stent placement OM 1  . CORONARY ARTERY BYPASS GRAFT    . CORONARY STENT PLACEMENT  7/12   2 stents--Promus element plus (everolimus eluting)--[facility] in [place]  . LEFT HEART CATH AND CORS/GRAFTS ANGIOGRAPHY N/A 10/28/2017   Procedure: LEFT HEART CATH AND CORS/GRAFTS ANGIOGRAPHY;  Surgeon: Leonie Man, MD;  Location: [place] CV LAB;  Service: Cardiovascular;  Laterality: N/A;  . LEFT HEART CATHETERIZATION WITH CORONARY/GRAFT ANGIOGRAM N/A 01/04/2012   Procedure: LEFT HEART CATHETERIZATION WITH Beatrix Fetters;  Surgeon: Burnell Blanks, MD;  Location: [facility] CATH LAB;  Service: Cardiovascular;  Laterality: N/A;  . LEFT HEART CATHETERIZATION WITH CORONARY/GRAFT ANGIOGRAM N/A 04/05/2014   Procedure: LEFT HEART CATHETERIZATION WITH Beatrix Fetters;  Surgeon: Jettie Booze, MD;  Location: [facility] CATH LAB;  Service: Cardiovascular;  Laterality: N/A;  . PERCUTANEOUS CORONARY STENT INTERVENTION (PCI-S)  04/05/2014   Procedure: PERCUTANEOUS CORONARY STENT INTERVENTION (PCI-S);  Surgeon: Jettie Booze, MD;  Location: [facility] CATH LAB;  Service: Cardiovascular;;  OM1  . RENAL  ANGIOGRAM N/A 04/16/2011   Procedure: RENAL ANGIOGRAM;  Surgeon: Burnell Blanks, MD;  Location: [facility] CATH LAB;  Service: Cardiovascular;  Laterality: N/A;  . RENAL ARTERY STENT  03/2011 ?   Family History Family History  Problem Relation Age of Onset  . Coronary artery disease Mother        Died MI age 76  . Hypertension Mother   . Heart attack Mother   . Coronary artery disease Sister        Living  . Coronary artery disease Brother        Living  . Coronary artery disease Father        Died MI age 48  . Heart attack Father   . Heart attack Maternal Grandfather   . Diabetes Neg Hx   . Cancer Neg Hx        prostate or colon    Social History Social History   Tobacco Use  . Smoking status: Former Smoker    Packs/day: 3.00    Years: 25.00    Pack years: 75.00    Types: Cigarettes    Quit date: 05/28/1993    Years since quitting: 26.6  . Smokeless tobacco: Never Used  Vaping Use  . Vaping Use: Never used  Substance Use Topics  . Alcohol use: Yes    Alcohol/week: 42.0 standard drinks    Types: 42 Cans of beer per week  . Drug use: No   Allergies Metoprolol tartrate and Metoclopramide  Review of Systems Review of Systems All other systems are reviewed and are negative for acute change except as noted in the HPI  Physical Exam Vital Signs  I have reviewed the triage vital signs BP (!) 145/101 (BP Location: Left Arm)   Pulse 70   Temp 97.7 F (36.5 C) (Oral)   Resp 18   SpO2 100%   Physical Exam Vitals reviewed.  Constitutional:      General: He is not in acute  distress.    Appearance: He is well-developed. He is not diaphoretic.  HENT:     Head: Normocephalic and atraumatic.     Nose: Nose normal.  Eyes:     General: No scleral icterus.       Right eye: No discharge.        Left eye: No discharge.     Conjunctiva/sclera: Conjunctivae normal.     Pupils: Pupils are equal, round, and reactive to light.  Cardiovascular:     Rate and Rhythm:  Normal rate and regular rhythm.     Heart sounds: No murmur heard.  No friction rub. No gallop.   Pulmonary:     Effort: Pulmonary effort is normal. No respiratory distress.     Breath sounds: Normal breath sounds. No stridor. No rales.  Abdominal:     General: There is no distension.     Palpations: Abdomen is soft.     Tenderness: There is no abdominal tenderness.  Musculoskeletal:        General: No tenderness.     Cervical back: Normal range of motion and neck supple.  Skin:    General: Skin is warm and dry.     Findings: No erythema or rash.  Neurological:     Mental Status: He is alert and oriented to person, place, and time.     ED Results and Treatments Labs (all labs ordered are listed, but only abnormal results are displayed) Labs Reviewed  BASIC METABOLIC PANEL - Abnormal; Notable for the following components:      Result Value   Sodium 133 (*)    Glucose, Bld 110 (*)    BUN 6 (*)    All other components within normal limits  CBC - Abnormal; Notable for the following components:   RBC 4.06 (*)    All other components within normal limits  TROPONIN I (HIGH SENSITIVITY)  TROPONIN I (HIGH SENSITIVITY)  TROPONIN I (HIGH SENSITIVITY)                                                                                                                         EKG  EKG Interpretation  Date/Time:  Friday January 18 2020 13:50:58 EDT Ventricular Rate:  67 PR Interval:  146 QRS Duration: 88 QT Interval:  428 QTC Calculation: 452 R Axis:   79 Text Interpretation: Normal sinus rhythm Possible Anterior infarct , age undetermined Abnormal ECG No significant change since last tracing Confirmed by Addison Lank [phone]) on 01/19/2020 1:24:57 AM      Radiology DG Chest 2 View  Result Date: 01/18/2020 CLINICAL DATA:  Chest pain EXAM: CHEST - 2 VIEW COMPARISON:  10/28/2017 FINDINGS: Postop median sternotomy. Right and left coronary stents noted. Heart size normal. Negative  for heart failure. Lungs are clear without infiltrate or effusion. Atherosclerotic calcification aortic arch. IMPRESSION: No active cardiopulmonary disease. Electronically Signed   By: Franchot Gallo M.D.   On: 01/18/2020 14:21    Pertinent labs &  imaging results that were available during my care of the patient were reviewed by me and considered in my medical decision making (see chart for details).  Medications Ordered in ED Medications - No data to display                                                                                                                                  Procedures .1-3 Lead EKG Interpretation Performed by: Fatima Blank, MD Authorized by: Fatima Blank, MD     Interpretation: normal     ECG rate:  70   ECG rate assessment: normal     Rhythm: sinus rhythm     Ectopy comment:  Infrequent PVCs   Conduction: normal      (including critical care time)  Medical Decision Making / ED Course I have reviewed the nursing notes for this encounter and the patient's prior records (if available in EHR or on provided paperwork).   [name] was evaluated in Emergency Department on 01/19/2020 for the symptoms described in the history of present illness. He was evaluated in the context of the global COVID-19 pandemic, which necessitated consideration that the patient might be at risk for infection with the SARS-CoV-2 virus that causes COVID-19. Institutional protocols and algorithms that pertain to the evaluation of patients at risk for COVID-19 are in a state of rapid change based on information released by regulatory bodies including the CDC and federal and state organizations. These policies and algorithms were followed during the patient's care in the ED.  Patient reports for near syncopal episode. Complains of atypical chest pain after the episode.  EKG without acute ischemic changes or evidence of pericarditis.  Troponins drawn in triage were  both negative 6 hours apart.  By the time of my assessment, the patient had been in the waiting room for approximately 11-1/2 hours.  I repeated a third troponin which is negative.  By this time and have been more than 14 hours since his episode.  I feel this is sufficient to rule out ACS.  Low suspicion for pulmonary embolism.  Presentation is not classic for aortic dissection or esophageal perforation.  Chest x-ray without evidence suggestive of pneumonia, pneumothorax, pneumomediastinum.  No abnormal contour of the mediastinum to suggest dissection. No evidence of acute injuries.  Orthostatics here were reassuring.  Patient did have infrequent PVCs.  Reports that he already has a follow-up appointment with his cardiologist within 5 days.      Final Clinical Impression(s) / ED Diagnoses Final diagnoses:  Atypical chest pain  Near syncope    The patient appears reasonably screened and/or stabilized for discharge and I doubt any other medical condition or other [facility] requiring further screening, evaluation, or treatment in the ED at this time prior to discharge. Safe for discharge with strict return precautions.  Disposition: Discharge  Condition: Good  I have discussed the results, Dx and Tx plan  with the patient/family who expressed understanding and agree(s) with the plan. Discharge instructions discussed at length. The patient/family was given strict return precautions who verbalized understanding of the instructions. No further questions at time of discharge.    ED Discharge Orders    None        Follow Up: Cardiology  On 01/22/2020 as scheduled     This chart was dictated using voice recognition software.  Despite best efforts to proofread,  errors can occur which can change the documentation meaning.   Fatima Blank, MD 01/19/20 [phone]

## 2020-01-21 NOTE — Progress Notes (Deleted)
Cardiology Office Note    Date:  01/21/2020   ID:  [name], DOB 1950-06-30, MRN [id number]  PCP:  Venia Carbon, MD  Cardiologist:  Ida Rogue, MD  Electrophysiologist:  None   Chief Complaint: ED follow up  History of Present Illness:   [name] is a 69 y.o. male with history of extensive CAD status post CABG in 1995 status post subsequent multiple PCIs as outlined below, AAA, renal artery stenosis status post prior left renal artery stenting, PAD, HTN, HLD, COPD secondary to prior tobacco use 3 packs/day for approximately 25 to 28 years quitting in 1995, chronic mild hyponatremia, diverticulitis, and alcohol use who presents for ED follow up.  He had CABG1995withLIMA->LAD, VG->RI, VG->AM->PDA. In 2004, he hadan inferior STEMI.VG->AM->PDA was found to be occluded and treated with2 BMScomplicated byacute thrombosis requiring3 BMS. In 2005, he receivedDES to LAD &LCx (VG's to PDA &RI 100% - known). In 2012, he hadAcute MI ([place])and receivedDES to LCx & LM. Cath in 2015 with DES x 2 to OM, also noting patent LIMA-LAD, (SVG-LCx and SVG-RCA were occluded by prior cath) EF 35% at that time.  Most recent cath was during an admission to the hospital in 10/2017 with unstable angina with diagnostic cath showing severe multi-vessel CAD with known CTO of the ostial RCA, mid LAD & SVG-AM-PDA, SVG-OM. Widely patent LIMA-LAD, proximal left main ~ 40% stenosed (stable lesion just prior to the stent) followed by widely patent stents running from pLM-ost-prox LCx-OM with brisk flow in the distal OM & AV Groove Cx-->collaterals to RPL system. Mildly reduced LVEF with basal-mid inferior AK. Normal/low LVEDP.  No intervention was indicated.  Echo at that time showed an EF of 50%, mild LVH, grade 1 diastolic dysfunction, akinesis of the basal inferior and basal inferolateral myocardium, severe hypokinesis of the mid inferior and mid anterolateral myocardium, and mildly reduced RV  systolic function.  Most recent AAA ultrasound from 10/2019 showed a stable AAA measuring 4.2 cm at the largest diameter.  He was seen recently by his primary cardiologist on 12/24/2019 for routine follow-up and was doing well from a cardiac perspective maintaining an active lifestyle.  No changes were made and follow-up was recommended in 12 months.  Phone note from 01/14/2020 with noted soft BPs with PCP recommendation to hold lisinopril until patient's primary cardiologist could review.  Readings were noted to be in the 90s to low 389H systolic over 73S to 28J diastolic.  Note indicates he had not been eating well.  Following hydration patient's wife called with updated BP readings from the low 681L to 572I systolic over 20B to 55H diastolic.  It was recommended to continue to hold lisinopril.    Following this, they contacted our office on 9/24 with reported BP of 192/132 with repeat BP 173/112.  He was advised to take lisinopril and appointment was scheduled for later that day.  This appointment was subsequently canceled as patient was seen in the ED on 9/24 with presyncope with sudden onset of lightheadedness, paleness, and slight substernal chest pain occurring after carrying lumber to his truck.  He reported it was during this episode when his BP was noted to be in the 741U systolic as outlined above.  His BP in the ED was noted to be 145/101 with a heart rate of 70 bpm.  Work-up showed an initial high-sensitivity troponin of 7 with a delta of 8 and subsequent value of 7.  Chest x-ray without active cardiopulmonary disease.  EKG was sinus rhythm with no acute ST-T changes.  He was noted to have infrequent PVCs on telemetry.  Due to the above prolonged reassuring work-up outpatient follow-up was recommended.  ***   Labs independently reviewed: 12/2019 - Hgb 13.1, PLT 305, potassium 4.3, BUN 6, serum creatinine 0.72 09/2018 - direct LDL 62, TC 169, TG 492, HDL 44, albumin 4.0, AST 73, ALT  normal 10/2017 - TSH normal  Past Medical History:  Diagnosis Date  . AAA (abdominal aortic aneurysm) ([place])    a. Duplex 01/2012: stable infrarenal saccular AAA at 3.3cm x 3.2xm (f/u recommended 01/2013 per Dr. Rockey Situ)  . Alcohol abuse   . CAD (coronary artery disease)    a. 1995 s/p CABG x 4: LIMA->LAD, VG->RI (known to be occluded), VG->AM->PDA;  b. 05/2002 Inf STEMI: VG->AM->PDA 100% treated w/ 2 BMS complicated by acute thrombosis req 3 BMS;  c. 07/2003 DES to  LAD & LCX (VG's to PDA & RI 100%);  d. 12/2010 Acute MI ([place]): DES to LCX & LM , LIMA ok,;  e.12/2011 Cath: LM/LCX stents ok , LIMA patent.  . Cardiomyopathy ([place])    a. EF 35% by cath 2015.  . Diverticulitis    1/06 Diverticulitis--CT of pelvis--diffuse sigmoid divertic  . Dyspnea   . GERD (gastroesophageal reflux disease)   . Hyperlipidemia   . Hypertension   . Myocardial infarction ([name])   . PAD (peripheral artery disease) ([facility])    a. external iliac and mesenteric stenosis noted by noninvasive imaging.  . Renal artery stenosis ([place])    a. 03/2011 PTA and stenting of L RA. b. last duplex 2016 with stable 1-59% bilateral RAS, incidental >50% R EIA stenosis    Past Surgical History:  Procedure Laterality Date  . ANGIOPLASTY  1997  . CARDIAC CATHETERIZATION     8/09  Cath--vein graft occlusions which are old--no acute changes  . CARDIAC CATHETERIZATION  11/13/2010   stent x 2 @ [place]  . CARDIAC CATHETERIZATION  04/05/2014   stent placement   . CARDIAC CATHETERIZATION  10/28/2017  . CLOSED REDUCTION SHOULDER DISLOCATION    . COLONOSCOPY WITH PROPOFOL N/A 12/22/2018   Procedure: COLONOSCOPY WITH PROPOFOL;  Surgeon: Jonathon Bellows, MD;  Location: [facility] ENDOSCOPY;  Service: Gastroenterology;  Laterality: N/A;  . CORONARY ANGIOPLASTY  04/05/2014   stent placement OM 1  . CORONARY ARTERY BYPASS GRAFT    . CORONARY STENT PLACEMENT  7/12   2 stents--Promus element plus (everolimus eluting)--[facility] in [place]  . LEFT  HEART CATH AND CORS/GRAFTS ANGIOGRAPHY N/A 10/28/2017   Procedure: LEFT HEART CATH AND CORS/GRAFTS ANGIOGRAPHY;  Surgeon: Leonie Man, MD;  Location: [facility] LAB;  Service: Cardiovascular;  Laterality: N/A;  . LEFT HEART CATHETERIZATION WITH CORONARY/GRAFT ANGIOGRAM N/A 01/04/2012   Procedure: LEFT HEART CATHETERIZATION WITH Beatrix Fetters;  Surgeon: Burnell Blanks, MD;  Location: [facility] CATH LAB;  Service: Cardiovascular;  Laterality: N/A;  . LEFT HEART CATHETERIZATION WITH CORONARY/GRAFT ANGIOGRAM N/A 04/05/2014   Procedure: LEFT HEART CATHETERIZATION WITH Beatrix Fetters;  Surgeon: Jettie Booze, MD;  Location: [facility] CATH LAB;  Service: Cardiovascular;  Laterality: N/A;  . PERCUTANEOUS CORONARY STENT INTERVENTION (PCI-S)  04/05/2014   Procedure: PERCUTANEOUS CORONARY STENT INTERVENTION (PCI-S);  Surgeon: Jettie Booze, MD;  Location: [facility] CATH LAB;  Service: Cardiovascular;;  OM1  . RENAL ANGIOGRAM N/A 04/16/2011   Procedure: RENAL ANGIOGRAM;  Surgeon: Burnell Blanks, MD;  Location: [facility] CATH LAB;  Service: Cardiovascular;  Laterality: N/A;  .  RENAL ARTERY STENT  03/2011 ?    Current Medications: No outpatient medications have been marked as taking for the 01/22/20 encounter (Appointment) with Rise Mu, PA-C.    Allergies:   Metoprolol tartrate and Metoclopramide   Social History   Socioeconomic History  . Marital status: Married    Spouse name: Not on file  . Number of children: 3  . Years of education: Not on file  . Highest education level: Not on file  Occupational History  . Occupation: Geneticist, molecular (Animal nutritionist)    Comment: Retired  Tobacco Use  . Smoking status: Former Smoker    Packs/day: 3.00    Years: 25.00    Pack years: 75.00    Types: Cigarettes    Quit date: 05/28/1993    Years since quitting: 26.6  . Smokeless tobacco: Never Used  Vaping Use  . Vaping Use: Never used  Substance and Sexual Activity   . Alcohol use: Yes    Alcohol/week: 42.0 standard drinks    Types: 42 Cans of beer per week  . Drug use: No  . Sexual activity: Not on file  Other Topics Concern  . Not on file  Social History Narrative   No living will   No health care POA but requests wife--then children   Would accept resuscitation   Not sure about tube feeds   Social Determinants of Health   Financial Resource Strain:   . Difficulty of Paying Living Expenses: Not on file  Food Insecurity:   . Worried About Charity fundraiser in the Last Year: Not on file  . Ran Out of Food in the Last Year: Not on file  Transportation Needs:   . Lack of Transportation (Medical): Not on file  . Lack of Transportation (Non-Medical): Not on file  Physical Activity:   . Days of Exercise per Week: Not on file  . Minutes of Exercise per Session: Not on file  Stress:   . Feeling of Stress : Not on file  Social Connections:   . Frequency of Communication with Friends and Family: Not on file  . Frequency of Social Gatherings with Friends and Family: Not on file  . Attends Religious Services: Not on file  . Active Member of Clubs or Organizations: Not on file  . Attends Archivist Meetings: Not on file  . Marital Status: Not on file     Family History:  The patient's family history includes Coronary artery disease in his brother, father, mother, and sister; Heart attack in his father, maternal grandfather, and mother; Hypertension in his mother. There is no history of Diabetes or Cancer.  ROS:   ROS   EKGs/Labs/Other Studies Reviewed:    Studies reviewed were summarized above. The additional studies were reviewed today:  AAA ultrasound 11/12/2019: Summary:  Abdominal Aorta:  There is evidence of abnormal dilatation of the distal Abdominal aorta.  The largest aortic diameter remains essentially unchanged compared to  prior exam.  Previous diameter measurement was 4.2 cm obtained on  11/06/2018. __________  2D echo 10/2017: - Left ventricle: The cavity size was normal. Wall thickness was  increased in a pattern of mild LVH. Systolic function was at the  lower limits of normal. The estimated ejection fraction was 50%.  Doppler parameters are consistent with abnormal left ventricular  relaxation (grade 1 diastolic dysfunction).  - Regional wall motion abnormality: Akinesis of the basal inferior  and basal inferolateral myocardium; severe hypokinesis of the mid  inferior and mid inferolateral myocardium.  - Aortic valve: Trileaflet; mildly calcified leaflets. There was no  stenosis.  - Right ventricle: Systolic function was mildly reduced. __________  LHC 10/2017:  There is mild to moderate left ventricular systolic dysfunction. The LVEF was 45-50% by visual estimate basal inferior -aneurysmal akinesis.  LV end diastolic pressure is normal.  _________________________________________________________________________________________  Colon Flattery LM lesion is 45% stenosed - just prior to stent.  Previously placed Mid LM to Dist LM stent (unknown type) is widely patent - the stent continues into the pCx.  Previously placed Ost Cx to Mid Cx stents (unknown type -several overlapping BMS and DES) are widely patent.  Prox LAD lesion is 100% stenosed = just after large branching SP 1  LIMA-LAD graft was visualized by angiography and is normal in caliber. The graft exhibits no disease.  Ost RCA to Dist RCA lesion is 100% stenosed  Seq SVG- AVM-PDA graft was not visualized due to known occlusion.  SVG-OM graft was not visualized due to known occlusion.   RELATIVELY STABLE CAD FROM 2015  Known Severe Multi-vessel CAD: known CTO of ostRCA, mLAD & SVG-AM-PDA, SVG-OM.  Widely patent LIMA-LAD - retograde fills to 100% CTO, antegrade flow brisk to the Apex.  Prox LM ~40% (stable lesion just prior to the stent) followed by widely patent stents running from  pLM-ost-proxCx-OM with brisk flow in both the distal OM & AV Groove Cx --> collaterals to RPL system.  Mldly reduced LVEF with basal-mid Inferior Akinesis.  Normal/Low LVEDP.   PLAN: Return to nursing unit for ongoing care and TR band removal.    Post-cath hydration.    Evaluate for noncardiac cause for chest pain.  Recommend dual antiplatelet therapy with Aspirin 81mg  daily and Clopidogrel 75mg  daily long-term (beyond 12 months) because of extensive CAD & multiple overlapping stents in LM-Cx-OM.   EKG:  EKG is ordered today.  The EKG ordered today demonstrates ***  Recent Labs: 01/18/2020: BUN 6; Creatinine, Ser 0.72; Hemoglobin 13.1; Platelets 305; Potassium 4.3; Sodium 133  Recent Lipid Panel    Component Value Date/Time   CHOL 169 10/19/2018 1405   CHOL 163 08/11/2015 0941   TRIG (H) 10/19/2018 1405    492.0 Triglyceride is over 400; calculations on Lipids are invalid.   HDL 44.30 10/19/2018 1405   HDL 46 08/11/2015 0941   CHOLHDL 4 10/19/2018 1405   VLDL 63 (H) 10/29/2017 0233   LDLCALC 24 10/29/2017 0233   LDLCALC 63 01/07/2017 1120   LDLDIRECT 62.0 10/19/2018 1405    PHYSICAL EXAM:    VS:  There were no vitals taken for this visit.  BMI: There is no height or weight on file to calculate BMI.  Physical Exam  Wt Readings from Last 3 Encounters:  12/24/19 163 lb 8 oz (74.2 kg)  12/06/19 165 lb (74.8 kg)  02/16/19 159 lb (72.1 kg)     ASSESSMENT & PLAN:   1. ***  Disposition: F/u with Dr. Rockey Situ or an APP in ***.   Medication Adjustments/Labs and Tests Ordered: Current medicines are reviewed at length with the patient today.  Concerns regarding medicines are outlined above. Medication changes, Labs and Tests ordered today are summarized above and listed in the Patient Instructions accessible in Encounters.   Signed, Christell Faith, PA-C 01/21/2020 5:13 PM     [address] [place], [place] [address] [phone]

## 2020-01-22 ENCOUNTER — Ambulatory Visit: Payer: Medicare Other | Admitting: Physician Assistant

## 2020-01-22 ENCOUNTER — Encounter: Payer: Self-pay | Admitting: Cardiovascular Disease

## 2020-01-22 ENCOUNTER — Ambulatory Visit (INDEPENDENT_AMBULATORY_CARE_PROVIDER_SITE_OTHER): Payer: Medicare Other | Admitting: Cardiovascular Disease

## 2020-01-22 ENCOUNTER — Ambulatory Visit: Payer: Medicare Other | Admitting: Cardiovascular Disease

## 2020-01-22 ENCOUNTER — Other Ambulatory Visit: Payer: Self-pay

## 2020-01-22 VITALS — BP 122/80 | HR 62 | Ht 64.0 in | Wt 165.0 lb

## 2020-01-22 DIAGNOSIS — I10 Essential (primary) hypertension: Secondary | ICD-10-CM

## 2020-01-22 DIAGNOSIS — I951 Orthostatic hypotension: Secondary | ICD-10-CM

## 2020-01-22 DIAGNOSIS — I714 Abdominal aortic aneurysm, without rupture, unspecified: Secondary | ICD-10-CM

## 2020-01-22 DIAGNOSIS — E785 Hyperlipidemia, unspecified: Secondary | ICD-10-CM

## 2020-01-22 DIAGNOSIS — I25119 Atherosclerotic heart disease of native coronary artery with unspecified angina pectoris: Secondary | ICD-10-CM | POA: Diagnosis not present

## 2020-01-22 DIAGNOSIS — R42 Dizziness and giddiness: Secondary | ICD-10-CM | POA: Diagnosis not present

## 2020-01-22 DIAGNOSIS — I701 Atherosclerosis of renal artery: Secondary | ICD-10-CM | POA: Diagnosis not present

## 2020-01-22 DIAGNOSIS — I739 Peripheral vascular disease, unspecified: Secondary | ICD-10-CM

## 2020-01-22 NOTE — Telephone Encounter (Signed)
Patient coming in to see Dr. Rockey Situ today

## 2020-01-22 NOTE — Progress Notes (Signed)
Date:  01/22/2020   ID:  [name], DOB [birth year], MRN [id number]  Patient Location:  [address] [place] [address]   Provider location:   [facility], [place] office  PCP:  Venia Carbon, MD  Cardiologist:  Arvid Right [facility]   Chief Complaint  Patient presents with  . Follow-up    Follow up for chest pain and ED visit, BP was fluctuating up and down. Medications verbally reviewed with patient.     History of Present Illness:    [name] is a 69 y.o. male  past medical history of coronary artery disease, bypass surgery in 1995,  stenting at [facility] in February 2004 for MI with a 3.0 x 25 mm and 4.5 x 16 mm Monorail ( location uncertain), also 4.5 x 18 mm stent placed to the mid RCA, followup with repeat stenting in April 2005 to the proximal LAD with a Taxus stent 2.5 x 8 mm, and stenting to the proximal left circumflex with a 2.5 x 12 mm Taxus, with  stent placed November 13, 2010 with a 4.0 x 9 mm stent placed to the left main. renal artery stent. Abdominal aortic aneurysm 3.2 cm smoker though stopped in 1995. Prior to that he smoked 3 packs per day. Smoked for approximately 25-28 years 4.1 cm AAA on CT scan 10/2017  He presents for routine followup of his coronary artery disease.  Last seen by telemetry visit August 2020  Recently seen in the emergency room January 19, 2020   carried lumbar to his truck.  lightheadedness, vision issues, feeling bad  family told him that he was pale  In the ER, BP normal TNT normal x3, sodium 133  Now no regular exercise program Active at home Yesterday , building deck stairs, pressure 99 systolic at end of the day  EKG personally reviewed by myself on todays visit NSR rate 62 bpm, no ST or T wave changes frequent PVCs  Other past medical history reviewed Aorta u/s 10/2018 Stable size, 4.2 cm  Unchanged, discussed  Cardiac cath 10/25/2017 No stents  Known  Severe Multi-vessel CAD: known CTO of ostRCA, mLAD & SVG-AM-PDA, SVG-OM.  Widely patent LIMA-LAD - retograde fills to 100% CTO, antegrade flow brisk to the Apex.  Prox LM ~40% (stable lesion just prior to the stent) followed by widely patent stents running from pLM-ost-proxCx-OM with brisk flow in both the distal OM & AV Groove Cx --> collaterals to RPL system.  Mldly reduced LVEF with basal-mid Inferior Akinesis.  Normal/Low LVEDP.    04/05/2014,  catheterization. This showed an occluded OM vessel with DES placed 2 Occluded mid LAD with a patent LIMA to the LAD, patent left circumflex, prior bare-metal stent into the OM1 was occluded in the distal portion just after the ostium of OM1. Occluded RCA, ejection fraction 35%   repeat catheterization performed 12/2011 This showed patent stents to the left circumflex and LAD with patent LIMA to the LAD. Vein graft to the OM and vein graft to the RCA was occluded     Past Medical History:  Diagnosis Date  . AAA (abdominal aortic aneurysm) ([place])    a. Duplex 01/2012: stable infrarenal saccular AAA at 3.3cm x 3.2xm (f/u recommended 01/2013 per Dr. Rockey Situ)  . Alcohol abuse   . CAD (coronary artery disease)    a. 1995 s/p CABG x 4: LIMA->LAD, VG->RI (known to be occluded), VG->AM->PDA;  b. 05/2002 Inf  STEMI: VG->AM->PDA 100% treated w/ 2 BMS complicated by acute thrombosis req 3 BMS;  c. 07/2003 DES to  LAD & LCX (VG's to PDA & RI 100%);  d. 12/2010 Acute MI ([place]): DES to LCX & LM , LIMA ok,;  e.12/2011 Cath: LM/LCX stents ok , LIMA patent.  . Cardiomyopathy ([place])    a. EF 35% by cath 2015.  . Diverticulitis    1/06 Diverticulitis--CT of pelvis--diffuse sigmoid divertic  . Dyspnea   . GERD (gastroesophageal reflux disease)   . Hyperlipidemia   . Hypertension   . Myocardial infarction ([place])   . PAD (peripheral artery disease) ([facility])    a. external iliac and mesenteric stenosis noted by noninvasive imaging.  . Renal artery stenosis ([place])    a.  03/2011 PTA and stenting of L RA. b. last duplex 2016 with stable 1-59% bilateral RAS, incidental >50% R EIA stenosis   Past Surgical History:  Procedure Laterality Date  . ANGIOPLASTY  1997  . CARDIAC CATHETERIZATION     8/09  Cath--vein graft occlusions which are old--no acute changes  . CARDIAC CATHETERIZATION  11/13/2010   stent x 2 @ [place]  . CARDIAC CATHETERIZATION  04/05/2014   stent placement   . CARDIAC CATHETERIZATION  10/28/2017  . CLOSED REDUCTION SHOULDER DISLOCATION    . COLONOSCOPY WITH PROPOFOL N/A 12/22/2018   Procedure: COLONOSCOPY WITH PROPOFOL;  Surgeon: Jonathon Bellows, MD;  Location[facility] ENDOSCOPY;  Service: Gastroenterology;  Laterality: N/A;  . CORONARY ANGIOPLASTY  04/05/2014   stent placement OM 1  . CORONARY ARTERY BYPASS GRAFT    . CORONARY STENT PLACEMENT  7/12   2 stents--Promus element plus (everolimus eluting)--[facility] in [place]  . LEFT HEART CATH AND CORS/GRAFTS ANGIOGRAPHY N/A 10/28/2017   Procedure: LEFT HEART CATH AND CORS/GRAFTS ANGIOGRAPHY;  Surgeon: Leonie Man, MD;  Location: [place] CV LAB;  Service: Cardiovascular;  Laterality: N/A;  . LEFT HEART CATHETERIZATION WITH CORONARY/GRAFT ANGIOGRAM N/A 01/04/2012   Procedure: LEFT HEART CATHETERIZATION WITH Beatrix Fetters;  Surgeon: Burnell Blanks, MD;  Location: [facility] CATH LAB;  Service: Cardiovascular;  Laterality: N/A;  . LEFT HEART CATHETERIZATION WITH CORONARY/GRAFT ANGIOGRAM N/A 04/05/2014   Procedure: LEFT HEART CATHETERIZATION WITH Beatrix Fetters;  Surgeon: Jettie Booze, MD;  Location[facility] CATH LAB;  Service: Cardiovascular;  Laterality: N/A;  . PERCUTANEOUS CORONARY STENT INTERVENTION (PCI-S)  04/05/2014   Procedure: PERCUTANEOUS CORONARY STENT INTERVENTION (PCI-S);  Surgeon: Jettie Booze, MD;  Location: [facility] CATH LAB;  Service: Cardiovascular;;  OM1  . RENAL ANGIOGRAM N/A 04/16/2011   Procedure: RENAL ANGIOGRAM;  Surgeon: Burnell Blanks, MD;  Location: [facility] CATH LAB;  Service: Cardiovascular;  Laterality: N/A;  . RENAL ARTERY STENT  03/2011 ?     Allergies:   Metoprolol tartrate and Metoclopramide   Social History   Tobacco Use  . Smoking status: Former Smoker    Packs/day: 3.00    Years: 25.00    Pack years: 75.00    Types: Cigarettes    Quit date: 05/28/1993    Years since quitting: 26.6  . Smokeless tobacco: Never Used  Vaping Use  . Vaping Use: Never used  Substance Use Topics  . Alcohol use: Yes    Alcohol/week: 42.0 standard drinks    Types: 42 Cans of beer per week  . Drug use: No     Current Outpatient Medications on File Prior to Visit  Medication Sig Dispense Refill  . aspirin 81 MG tablet Take 81  mg by mouth daily.    Marland Kitchen BLACK ELDERBERRY PO Take 1 each by mouth daily.    . carvedilol (COREG) 3.125 MG tablet Take 1 tablet (3.125 mg total) by mouth 2 (two) times daily. 180 tablet 3  . clopidogrel (PLAVIX) 75 MG tablet Take 1 tablet (75 mg total) by mouth daily. 90 tablet 3  . ezetimibe (ZETIA) 10 MG tablet TAKE 1 TABLET BY MOUTH EVERY DAY (Patient taking differently: Take 10 mg by mouth daily. ) 90 tablet 3  . isosorbide mononitrate (IMDUR) 30 MG 24 hr tablet TAKE HALF TABLETS (15 MG TOTAL) BY MOUTH AT BEDTIME. (Patient taking differently: Take 30 mg by mouth at bedtime. ) 45 tablet 3  . lisinopril (ZESTRIL) 5 MG tablet TAKE 1 TABLET BY MOUTH EVERY DAY (Patient taking differently: Take 5 mg by mouth daily. ) 90 tablet 3  . mirtazapine (REMERON) 15 MG tablet TAKE 1 TABLET BY MOUTH EVERYDAY AT BEDTIME (Patient taking differently: Take 15 mg by mouth at bedtime. ) 90 tablet 3  . Multiple Vitamins-Minerals (MULTIVITAMIN WITH MINERALS) tablet Take 1 tablet by mouth daily.    . naproxen sodium (ALEVE) 220 MG tablet Take 220 mg by mouth daily as needed (pain).    . nitroGLYCERIN (NITROSTAT) 0.4 MG SL tablet Place 1 tablet (0.4 mg total) under the tongue every 5 (five) minutes as needed for chest pain. 25  tablet 2  . omeprazole (PRILOSEC) 20 MG capsule Take 20 mg by mouth daily.    . rosuvastatin (CRESTOR) 40 MG tablet TAKE 1 TABLET BY MOUTH EVERY DAY (Patient taking differently: Take 40 mg by mouth daily. ) 90 tablet 0   No current facility-administered medications on file prior to visit.     Family Hx: The patient's family history includes Coronary artery disease in his brother, father, mother, and sister; Heart attack in his father, maternal grandfather, and mother; Hypertension in his mother. There is no history of Diabetes or Cancer.  ROS:   Please see the history of present illness.    Review of Systems  Constitutional: Negative.   HENT: Negative.   Respiratory: Negative.   Cardiovascular: Negative.   Gastrointestinal: Negative.   Musculoskeletal: Negative.   Neurological: Positive for dizziness.  Psychiatric/Behavioral: Negative.   All other systems reviewed and are negative.    Labs/Other Tests and Data Reviewed:    Recent Labs: 01/18/2020: BUN 6; Creatinine, Ser 0.72; Hemoglobin 13.1; Platelets 305; Potassium 4.3; Sodium 133   Recent Lipid Panel Lab Results  Component Value Date/Time   CHOL 169 10/19/2018 02:05 PM   CHOL 163 08/11/2015 09:41 AM   TRIG (H) 10/19/2018 02:05 PM    492.0 Triglyceride is over 400; calculations on Lipids are invalid.   HDL 44.30 10/19/2018 02:05 PM   HDL 46 08/11/2015 09:41 AM   CHOLHDL 4 10/19/2018 02:05 PM   LDLCALC 24 10/29/2017 02:33 AM   LDLCALC 63 01/07/2017 11:20 AM   LDLDIRECT 62.0 10/19/2018 02:05 PM    Wt Readings from Last 3 Encounters:  01/22/20 165 lb (74.8 kg)  12/24/19 163 lb 8 oz (74.2 kg)  12/06/19 165 lb (74.8 kg)     Exam:    Vital Signs: Vital signs may also be detailed in the HPI BP 122/80 (BP Location: Left Arm, Patient Position: Sitting, Cuff Size: Normal)   Pulse 62   Ht 5\' 4"  (1.626 m)   Wt 165 lb (74.8 kg)   SpO2 98%   BMI 28.32 kg/m   Constitutional:  oriented to person, place, and time. No  distress.  HENT:  Head: Grossly normal Eyes:  no discharge. No scleral icterus.  Neck: No JVD, no carotid bruits  Cardiovascular: Regular rate and rhythm, no murmurs appreciated Pulmonary/Chest: Clear to auscultation bilaterally, no wheezes or rails Abdominal: Soft.  no distension.  no tenderness.  Musculoskeletal: Normal range of motion Neurological:  normal muscle tone. Coordination normal. No atrophy Skin: Skin warm and dry Psychiatric: normal affect, pleasant   ASSESSMENT & PLAN:    Problem List Items Addressed This Visit      Cardiology Problems   Coronary artery disease involving native coronary artery of native heart with angina pectoris ([place]) - Primary   Renal artery stenosis (HCC)   Peripheral vascular disease (HCC)   Orthostasis   Essential hypertension   AAA (abdominal aortic aneurysm) without rupture (H[facility])   Hyperlipidemia LDL goal <70      CAD Currently with no symptoms of angina. No further workup at this time. Continue current medication regimen.  Lightheaded spells Etiology unclear, recommend he hold lisinopril for low pressure at home 99 systolic yesterday Hydrate better Carotid ultrasound ordered If symptoms persist may need a ZIO monitor, this was discussed with him  AAA stable 4.2 cm  We will need periodic ultrasound  ETOH Cessation recommended  Essential hypertension We will cut back on lisinopril, stay on carvedilol and low-dose Imdur 15 daily  Hyperlipidemia LDL at goal  Osteoarthritis knees Voltaren cream as needed recommended   Total encounter time more than 25 minutes  Greater than 50% was spent in counseling and coordination of care with the patient   Signed, Ida Rogue, [id number], [place], [place] [address]

## 2020-01-22 NOTE — Patient Instructions (Addendum)
Medication Instructions:  Hold the lisinopril Monitor blood pressure Stay hydrated  If you need a refill on your cardiac medications before your next appointment, please call your pharmacy.    Lab work: No new labs needed   If you have labs (blood work) drawn today and your tests are completely normal, you will receive your results only by: Marland Kitchen MyChart Message (if you have MyChart) OR . A paper copy in the mail If you have any lab test that is abnormal or we need to change your treatment, we will call you to review the results.   Testing/Procedures: Carotid ultrasound for dizzy spells Your physician has requested that you have a carotid duplex. This test is an ultrasound of the carotid arteries in your neck. It looks at blood flow through these arteries that supply the brain with blood. Allow one hour for this exam. There are no restrictions or special instructions.     Follow-Up: At [facility], you and your health needs are our priority.  As part of our continuing mission to provide you with exceptional heart care, we have created designated Provider Care Teams.  These Care Teams include your primary Cardiologist (physician) and Advanced Practice Providers (APPs -  Physician Assistants and Nurse Practitioners) who all work together to provide you with the care you need, when you need it.  . You will need a follow up appointment in 6 months  . Providers on your designated Care Team:   . Murray Hodgkins, NP . Christell Faith, PA-C . Marrianne Mood, PA-C  Any Other Special Instructions Will Be Listed Below (If Applicable).  COVID-19 Vaccine Information can be found at: [email or web address].co.uk For questions related to vaccine distribution or appointments, please email [email or web address] or call [phone].

## 2020-02-04 ENCOUNTER — Other Ambulatory Visit: Payer: Self-pay | Admitting: Cardiovascular Disease

## 2020-02-04 ENCOUNTER — Other Ambulatory Visit: Payer: Self-pay

## 2020-02-04 ENCOUNTER — Ambulatory Visit (INDEPENDENT_AMBULATORY_CARE_PROVIDER_SITE_OTHER): Payer: Medicare Other

## 2020-02-04 DIAGNOSIS — I6523 Occlusion and stenosis of bilateral carotid arteries: Secondary | ICD-10-CM

## 2020-02-04 DIAGNOSIS — R42 Dizziness and giddiness: Secondary | ICD-10-CM | POA: Diagnosis not present

## 2020-02-07 ENCOUNTER — Telehealth: Payer: Self-pay | Admitting: *Deleted

## 2020-02-07 NOTE — Telephone Encounter (Signed)
Left voicemail message to call back for results.  

## 2020-02-07 NOTE — Telephone Encounter (Signed)
Spoke with patient and reviewed results and recommendations with him. He verbalized understanding with no further questions at this time.

## 2020-02-07 NOTE — Telephone Encounter (Signed)
-----   Message from Minna Merritts, MD sent at 02/06/2020  3:17 PM EDT ----- Carotid ultrasound 40 to 59% disease/stenosis bilaterally, Would recommend repeat in 1 year

## 2020-02-19 ENCOUNTER — Encounter: Payer: Self-pay | Admitting: Internal Medicine

## 2020-02-19 ENCOUNTER — Ambulatory Visit (INDEPENDENT_AMBULATORY_CARE_PROVIDER_SITE_OTHER): Payer: Medicare Other | Admitting: Internal Medicine

## 2020-02-19 ENCOUNTER — Other Ambulatory Visit: Payer: Self-pay

## 2020-02-19 VITALS — BP 122/80 | HR 60 | Temp 96.9°F | Ht 63.5 in | Wt 161.0 lb

## 2020-02-19 DIAGNOSIS — J449 Chronic obstructive pulmonary disease, unspecified: Secondary | ICD-10-CM

## 2020-02-19 DIAGNOSIS — I5032 Chronic diastolic (congestive) heart failure: Secondary | ICD-10-CM | POA: Diagnosis not present

## 2020-02-19 DIAGNOSIS — I700 Atherosclerosis of aorta: Secondary | ICD-10-CM

## 2020-02-19 DIAGNOSIS — I25119 Atherosclerotic heart disease of native coronary artery with unspecified angina pectoris: Secondary | ICD-10-CM | POA: Diagnosis not present

## 2020-02-19 DIAGNOSIS — Z23 Encounter for immunization: Secondary | ICD-10-CM

## 2020-02-19 DIAGNOSIS — Z0000 Encounter for general adult medical examination without abnormal findings: Secondary | ICD-10-CM | POA: Diagnosis not present

## 2020-02-19 DIAGNOSIS — K219 Gastro-esophageal reflux disease without esophagitis: Secondary | ICD-10-CM

## 2020-02-19 DIAGNOSIS — Z7189 Other specified counseling: Secondary | ICD-10-CM

## 2020-02-19 DIAGNOSIS — I714 Abdominal aortic aneurysm, without rupture, unspecified: Secondary | ICD-10-CM

## 2020-02-19 DIAGNOSIS — I701 Atherosclerosis of renal artery: Secondary | ICD-10-CM

## 2020-02-19 NOTE — Assessment & Plan Note (Signed)
Mostly quiet with isosorbide On carvedilol and statin, plavix, ASA Off lisinopril due to hypotension

## 2020-02-19 NOTE — Assessment & Plan Note (Signed)
Compensated now Same meds as for CAD

## 2020-02-19 NOTE — Assessment & Plan Note (Signed)
See social history 

## 2020-02-19 NOTE — Assessment & Plan Note (Signed)
I have personally reviewed the Medicare Annual Wellness questionnaire and have noted 1. The patient's medical and social history 2. Their use of alcohol, tobacco or illicit drugs 3. Their current medications and supplements 4. The patient's functional ability including ADL's, fall risks, home safety risks and hearing or visual             impairment. 5. Diet and physical activities 6. Evidence for depression or mood disorders  The patients weight, height, BMI and visual acuity have been recorded in the chart I have made referrals, counseling and provided education to the patient based review of the above and I have provided the pt with a written personalized care plan for preventive services.  I have provided you with a copy of your personalized plan for preventive services. Please take the time to review along with your updated medication list.  Colon due 2023 Still prefers no PSA Will update pneumovax and flu vaccines today COVID booster soon [place]

## 2020-02-19 NOTE — Assessment & Plan Note (Signed)
Recent check is stable at 4.2cm Is on statin

## 2020-02-19 NOTE — Addendum Note (Signed)
Addended by: Pilar Grammes on: 02/19/2020 10:10 AM   Modules accepted: Orders

## 2020-02-19 NOTE — Assessment & Plan Note (Signed)
Quiet on the PPI daily

## 2020-02-19 NOTE — Progress Notes (Signed)
Hearing Screening   125Hz  250Hz  500Hz  1000Hz  2000Hz  3000Hz  4000Hz  6000Hz  8000Hz   Right ear:   20 20 20  20     Left ear:   20 20 20  20       Visual Acuity Screening   Right eye Left eye Both eyes  Without correction:     With correction: 20/20 20/20 20/15

## 2020-02-19 NOTE — Assessment & Plan Note (Signed)
Stable mild symptoms Stopped smoking a while back No Rx

## 2020-02-19 NOTE — Progress Notes (Signed)
Subjective:    Patient ID: [name], male    DOB: 02-27-1951, 69 y.o.   MRN: [id number]  HPI  Here for Medicare wellness visit and follow up of chronic health conditions This visit occurred during the SARS-CoV-2 public health emergency.  Safety protocols were in place, including screening questions prior to the visit, additional usage of staff PPE, and extensive cleaning of exam room while observing appropriate contact time as indicated for disinfecting solutions.   Reviewed advanced directives Reviewed other doctors-----Dr Gollan--cardiology, [facility]--opto No hospitalizations or surgery this year No tobacco Still enjoys regular beer (3-4 per day) Vision is okay--but needs recheck Hearing is fine No falls No depression or anhedonia Independent with instrumental ADLs No sig memory issues  In the ER about a month ago---labile BP (81-856 systolic) Did settle down over the hours he waited Had some mild chest pain--he didn't think it was a heart attack Now off lisinopril--due to BP as low as 90's and dizziness BP slightly better but still some dizziness That was the only time he tried nitroglycerin--no other chest pain 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  Mostly grows watermelon  No palpitations Stable DOE with hard exertion---no recent change Edema controlled with compression hose Sleeps flat in bed---no PND  Recent carotid recheck---moderate blockages Also AAA checked yearly---recently stable at 4.2cm Has aortic atherosclerosis also Is on statin  No regular cough Will wheeze if he exerts hard  Having trouble with knees voltaren gel does help this Not really taking the aleve  Takes the omeprazole daily This prevent heartburn No dysphagia  Current Outpatient Medications on File Prior to Visit  Medication Sig Dispense Refill  . aspirin 81 MG tablet Take 81 mg by mouth daily.    Marland Kitchen BLACK ELDERBERRY PO Take 1 each by  mouth daily.    . carvedilol (COREG) 3.125 MG tablet Take 1 tablet (3.125 mg total) by mouth 2 (two) times daily. 180 tablet 3  . clopidogrel (PLAVIX) 75 MG tablet Take 1 tablet (75 mg total) by mouth daily. 90 tablet 3  . ezetimibe (ZETIA) 10 MG tablet TAKE 1 TABLET BY MOUTH EVERY DAY (Patient taking differently: Take 10 mg by mouth daily. ) 90 tablet 3  . isosorbide mononitrate (IMDUR) 30 MG 24 hr tablet TAKE HALF TABLETS (15 MG TOTAL) BY MOUTH AT BEDTIME. (Patient taking differently: Take 30 mg by mouth at bedtime. ) 45 tablet 3  . mirtazapine (REMERON) 15 MG tablet TAKE 1 TABLET BY MOUTH EVERYDAY AT BEDTIME (Patient taking differently: Take 15 mg by mouth at bedtime. ) 90 tablet 3  . Multiple Vitamins-Minerals (MULTIVITAMIN WITH MINERALS) tablet Take 1 tablet by mouth daily.    . nitroGLYCERIN (NITROSTAT) 0.4 MG SL tablet Place 1 tablet (0.4 mg total) under the tongue every 5 (five) minutes as needed for chest pain. 25 tablet 2  . omeprazole (PRILOSEC) 20 MG capsule Take 20 mg by mouth daily.    . rosuvastatin (CRESTOR) 40 MG tablet TAKE 1 TABLET BY MOUTH EVERY DAY (Patient taking differently: Take 40 mg by mouth daily. ) 90 tablet 0   No current facility-administered medications on file prior to visit.    Allergies  Allergen Reactions  . Metoprolol Tartrate Hives and Itching  . Metoclopramide Nausea Only    Other reaction(s): Unknown    Past Medical History:  Diagnosis Date  . AAA (abdominal aortic aneurysm) ([place])    a. Duplex 01/2012: stable infrarenal saccular AAA at  3.3cm x 3.2xm (f/u recommended 01/2013 per Dr. Rockey Situ)  . Alcohol abuse   . CAD (coronary artery disease)    a. 1995 s/p CABG x 4: LIMA->LAD, VG->RI (known to be occluded), VG->AM->PDA;  b. 05/2002 Inf STEMI: VG->AM->PDA 100% treated w/ 2 BMS complicated by acute thrombosis req 3 BMS;  c. 07/2003 DES to  LAD & LCX (VG's to PDA & RI 100%);  d. 12/2010 Acute [place] ([place]): DES to LCX & LM , LIMA ok,;  e.12/2011 Cath: LM/LCX stents  ok , LIMA patent.  . Cardiomyopathy ([place])    a. EF 35% by cath 2015.  . Diverticulitis    1/06 Diverticulitis--CT of pelvis--diffuse sigmoid divertic  . Dyspnea   . GERD (gastroesophageal reflux disease)   . Hyperlipidemia   . Hypertension   . Myocardial infarction ([place])   . PAD (peripheral artery disease) ([facility])    a. external iliac and mesenteric stenosis noted by noninvasive imaging.  . Renal artery stenosis ([place])    a. 03/2011 PTA and stenting of L RA. b. last duplex 2016 with stable 1-59% bilateral RAS, incidental >50% R EIA stenosis    Past Surgical History:  Procedure Laterality Date  . ANGIOPLASTY  1997  . CARDIAC CATHETERIZATION     8/09  Cath--vein graft occlusions which are old--no acute changes  . CARDIAC CATHETERIZATION  11/13/2010   stent x 2 @ [place]  . CARDIAC CATHETERIZATION  04/05/2014   stent placement   . CARDIAC CATHETERIZATION  10/28/2017  . CLOSED REDUCTION SHOULDER DISLOCATION    . COLONOSCOPY WITH PROPOFOL N/A 12/22/2018   Procedure: COLONOSCOPY WITH PROPOFOL;  Surgeon: Jonathon Bellows, MD;  Location: [facility] ENDOSCOPY;  Service: Gastroenterology;  Laterality: N/A;  . CORONARY ANGIOPLASTY  04/05/2014   stent placement OM 1  . CORONARY ARTERY BYPASS GRAFT    . CORONARY STENT PLACEMENT  7/12   2 stents--Promus element plus (everolimus eluting)--[facility] in [place]  . LEFT HEART CATH AND CORS/GRAFTS ANGIOGRAPHY N/A 10/28/2017   Procedure: LEFT HEART CATH AND CORS/GRAFTS ANGIOGRAPHY;  Surgeon: Leonie Man, MD;  Location: [facility] CV LAB;  Service: Cardiovascular;  Laterality: N/A;  . LEFT HEART CATHETERIZATION WITH CORONARY/GRAFT ANGIOGRAM N/A 01/04/2012   Procedure: LEFT HEART CATHETERIZATION WITH Beatrix Fetters;  Surgeon: Burnell Blanks, MD;  Location: [facility] CATH LAB;  Service: Cardiovascular;  Laterality: N/A;  . LEFT HEART CATHETERIZATION WITH CORONARY/GRAFT ANGIOGRAM N/A 04/05/2014   Procedure: LEFT HEART CATHETERIZATION WITH  Beatrix Fetters;  Surgeon: Jettie Booze, MD;  Location: [facility] CATH LAB;  Service: Cardiovascular;  Laterality: N/A;  . PERCUTANEOUS CORONARY STENT INTERVENTION (PCI-S)  04/05/2014   Procedure: PERCUTANEOUS CORONARY STENT INTERVENTION (PCI-S);  Surgeon: Jettie Booze, MD;  Location: [facility] CATH LAB;  Service: Cardiovascular;;  OM1  . RENAL ANGIOGRAM N/A 04/16/2011   Procedure: RENAL ANGIOGRAM;  Surgeon: Burnell Blanks, MD;  Location: [facility] CATH LAB;  Service: Cardiovascular;  Laterality: N/A;  . RENAL ARTERY STENT  03/2011 ?    Family History  Problem Relation Age of Onset  . Coronary artery disease Mother        Died MI age 64  . Hypertension Mother   . Heart attack Mother   . Coronary artery disease Sister        Living  . Coronary artery disease Brother        Living  . Coronary artery disease Father        Died MI age 30  . Heart attack Father   .  Heart attack Maternal Grandfather   . Diabetes Neg Hx   . Cancer Neg Hx        prostate or colon    Social History   Socioeconomic History  . Marital status: Married    Spouse name: Not on file  . Number of children: 3  . Years of education: Not on file  . Highest education level: Not on file  Occupational History  . Occupation: Geneticist, molecular (Animal nutritionist)    Comment: Retired  Tobacco Use  . Smoking status: Former Smoker    Packs/day: 3.00    Years: 25.00    Pack years: 75.00    Types: Cigarettes    Quit date: 05/28/1993    Years since quitting: 26.7  . Smokeless tobacco: Never Used  Vaping Use  . Vaping Use: Never used  Substance and Sexual Activity  . Alcohol use: Yes    Alcohol/week: 42.0 standard drinks    Types: 42 Cans of beer per week  . Drug use: No  . Sexual activity: Not on file  Other Topics Concern  . Not on file  Social History Narrative   No living will   No health care POA but requests wife--then children   Would accept resuscitation   Not sure about tube feeds    Social Determinants of Health   Financial Resource Strain:   . Difficulty of Paying Living Expenses: Not on file  Food Insecurity:   . Worried About Charity fundraiser in the Last Year: Not on file  . Ran Out of Food in the Last Year: Not on file  Transportation Needs:   . Lack of Transportation (Medical): Not on file  . Lack of Transportation (Non-Medical): Not on file  Physical Activity:   . Days of Exercise per Week: Not on file  . Minutes of Exercise per Session: Not on file  Stress:   . Feeling of Stress : Not on file  Social Connections:   . Frequency of Communication with Friends and Family: Not on file  . Frequency of Social Gatherings with Friends and Family: Not on file  . Attends Religious Services: Not on file  . Active Member of Clubs or Organizations: Not on file  . Attends Archivist Meetings: Not on file  . Marital Status: Not on file  Intimate Partner Violence:   . Fear of Current or Ex-Partner: Not on file  . Emotionally Abused: Not on file  . Physically Abused: Not on file  . Sexually Abused: Not on file   Review of Systems Edentulous---never happy with dentures (despite lower implants) Appetite is never great--mostly 1 meal a day. Weight is stable Sleeps okay in general (nocturia x 2-3) No skin lesions of concern--no dermatology Wears seat belt Bowels tend to be loose---may have 4 in the morning, then done. No blood No urinary problems---stream is okay Some hip pain also Easy bruising hands and arms    Objective:   Physical Exam Constitutional:      Appearance: Normal appearance.  HENT:     Mouth/Throat:     Comments: Edentulous No lesions Eyes:     Conjunctiva/sclera: Conjunctivae normal.     Pupils: Pupils are equal, round, and reactive to light.  Cardiovascular:     Rate and Rhythm: Normal rate and regular rhythm.     Pulses: Normal pulses.     Heart sounds: No murmur heard.  No gallop.   Pulmonary:     Effort: Pulmonary  effort is normal.     Breath sounds: Normal breath sounds. No wheezing or rales.  Abdominal:     Palpations: Abdomen is soft.     Tenderness: There is no abdominal tenderness.  Musculoskeletal:     Cervical back: Neck supple.     Right lower leg: No edema.     Left lower leg: No edema.  Lymphadenopathy:     Cervical: No cervical adenopathy.  Skin:    General: Skin is warm.     Findings: No rash.  Neurological:     Mental Status: He is alert and oriented to person, place, and time.     Comments: President--"Joe Balinda Quails Obama" [phone] [id number] Recall 3/3  Psychiatric:        Mood and Affect: Mood normal.        Behavior: Behavior normal.            Assessment & Plan:

## 2020-02-19 NOTE — Assessment & Plan Note (Signed)
On imaging Is on statin and zetia

## 2020-02-21 ENCOUNTER — Other Ambulatory Visit: Payer: Self-pay | Admitting: *Deleted

## 2020-02-21 ENCOUNTER — Other Ambulatory Visit: Payer: Self-pay | Admitting: Cardiovascular Disease

## 2020-02-21 MED ORDER — CARVEDILOL 3.125 MG PO TABS
3.1250 mg | ORAL_TABLET | Freq: Two times a day (BID) | ORAL | 2 refills | Status: DC
Start: 2020-02-21 — End: 2020-02-26

## 2020-02-21 MED ORDER — ROSUVASTATIN CALCIUM 40 MG PO TABS
40.0000 mg | ORAL_TABLET | Freq: Every day | ORAL | 0 refills | Status: DC
Start: 2020-02-21 — End: 2020-02-26

## 2020-02-21 NOTE — Telephone Encounter (Signed)
*  STAT* If patient is at the pharmacy, call can be transferred to refill team.   1. Which medications need to be refilled? (please list name of each medication and dose if known) rosuvastatin 40 mg  2. Which pharmacy/location (including street and city if local pharmacy) is medication to be sent to? [facility] [place]  3. Do they need a 30 day or 90 day supply? [place]

## 2020-02-21 NOTE — Telephone Encounter (Signed)
Requested Prescriptions   Signed Prescriptions Disp Refills   rosuvastatin (CRESTOR) 40 MG tablet 90 tablet 0    Sig: Take 1 tablet (40 mg total) by mouth daily.    Authorizing Provider: Minna Merritts    Ordering User: Britt Bottom

## 2020-02-22 ENCOUNTER — Other Ambulatory Visit: Payer: Self-pay

## 2020-02-22 MED ORDER — EZETIMIBE 10 MG PO TABS
10.0000 mg | ORAL_TABLET | Freq: Every day | ORAL | 1 refills | Status: DC
Start: 2020-02-22 — End: 2020-09-02

## 2020-02-22 MED ORDER — ISOSORBIDE MONONITRATE ER 30 MG PO TB24: ORAL_TABLET | ORAL | 1 refills | Status: DC

## 2020-02-22 MED ORDER — CLOPIDOGREL BISULFATE 75 MG PO TABS
75.0000 mg | ORAL_TABLET | Freq: Every day | ORAL | 1 refills | Status: DC
Start: 2020-02-22 — End: 2020-09-02

## 2020-02-25 ENCOUNTER — Telehealth: Payer: Self-pay | Admitting: Cardiovascular Disease

## 2020-02-25 NOTE — Telephone Encounter (Signed)
Fax received from [facility] asking for clarification.  Message for review: RX for Isosorbide MONO 30 mg tab with instructions to cut this tablet. This medication is not designed to be split and cutting may alter its release. Please clarify the directions and strength.  Pharmacy asking for new rx to be sent with clarification.

## 2020-02-26 ENCOUNTER — Other Ambulatory Visit: Payer: Self-pay | Admitting: *Deleted

## 2020-02-26 MED ORDER — ROSUVASTATIN CALCIUM 40 MG PO TABS
40.0000 mg | ORAL_TABLET | Freq: Every day | ORAL | 0 refills | Status: DC
Start: 2020-02-26 — End: 2020-08-15

## 2020-02-26 MED ORDER — CARVEDILOL 3.125 MG PO TABS
3.1250 mg | ORAL_TABLET | Freq: Two times a day (BID) | ORAL | 0 refills | Status: DC
Start: 2020-02-26 — End: 2021-01-20

## 2020-02-28 ENCOUNTER — Other Ambulatory Visit: Payer: Self-pay

## 2020-02-28 MED ORDER — MIRTAZAPINE 15 MG PO TABS: ORAL_TABLET | ORAL | 3 refills | Status: DC

## 2020-03-02 NOTE — Telephone Encounter (Signed)
My last notes that he was taking 30 mg in the evening Can we confirm If he is only taking half pill in the evening, he can go ahead and take half pill in the evening .

## 2020-03-05 ENCOUNTER — Other Ambulatory Visit: Payer: Self-pay

## 2020-03-05 MED ORDER — ISOSORBIDE MONONITRATE ER 30 MG PO TB24: ORAL_TABLET | ORAL | 1 refills | Status: DC

## 2020-03-05 NOTE — Telephone Encounter (Signed)
isosorbide mononitrate (IMDUR) 30 MG 24 hr tablet 45 tablet 1 03/05/2020    Sig: TAKE HALF TABLETS (15 MG TOTAL) BY MOUTH AT BEDTIME.   Sent to pharmacy as: isosorbide mononitrate (IMDUR) 30 MG 24 hr tablet   E-Prescribing Status: Receipt confirmed by pharmacy (03/05/2020  3:43 PM EST)   Pharmacy  [facility] [place], [place]

## 2020-03-05 NOTE — Telephone Encounter (Signed)
Patient states that he is currently taking 1/2 tablet daily in the evenings.  I wills send an updated prescription and update chart to reflect this.

## 2020-03-27 ENCOUNTER — Other Ambulatory Visit: Payer: Self-pay

## 2020-03-27 MED ORDER — MIRTAZAPINE 15 MG PO TABS: ORAL_TABLET | ORAL | 3 refills | Status: DC

## 2020-03-27 NOTE — Telephone Encounter (Signed)
Rx sent electronically.  

## 2020-03-31 DIAGNOSIS — Z23 Encounter for immunization: Secondary | ICD-10-CM | POA: Diagnosis not present

## 2020-04-21 ENCOUNTER — Telehealth: Payer: Self-pay

## 2020-04-21 NOTE — Telephone Encounter (Signed)
Can add on with me at noon tomorrow

## 2020-04-21 NOTE — Telephone Encounter (Signed)
I spoke to patient's wife and she scheduled appointment on 04/22/20 at 12:00.

## 2020-04-21 NOTE — Telephone Encounter (Signed)
noted 

## 2020-04-21 NOTE — Telephone Encounter (Signed)
Pt's wife called to report pt started to experience some dizziness yesterday morning also with some ear fullness, Sx improved last night but is still having this morning... BP today was 127/70 and denies cardiac Sx... please advise as there are no openings in the office through Wed.Marland KitchenMarland Kitchen

## 2020-04-22 ENCOUNTER — Other Ambulatory Visit: Payer: Self-pay

## 2020-04-22 ENCOUNTER — Ambulatory Visit (INDEPENDENT_AMBULATORY_CARE_PROVIDER_SITE_OTHER): Payer: Medicare Other | Admitting: Internal Medicine

## 2020-04-22 ENCOUNTER — Encounter: Payer: Self-pay | Admitting: Internal Medicine

## 2020-04-22 DIAGNOSIS — I701 Atherosclerosis of renal artery: Secondary | ICD-10-CM | POA: Diagnosis not present

## 2020-04-22 DIAGNOSIS — R42 Dizziness and giddiness: Secondary | ICD-10-CM | POA: Insufficient documentation

## 2020-04-22 MED ORDER — MECLIZINE HCL 25 MG PO TABS: 25.0000 mg | ORAL_TABLET | Freq: Three times a day (TID) | ORAL | 2 refills | Status: DC | PRN

## 2020-04-22 NOTE — Progress Notes (Signed)
Hearing Screening   Method: Audiometry   125Hz 250Hz 500Hz 1000Hz 2000Hz 3000Hz 4000Hz 6000Hz 8000Hz  Right ear:   20 20 20  20    Left ear:   20 20 20  20      

## 2020-04-22 NOTE — Assessment & Plan Note (Addendum)
Had sudden hearing loss--then vertigo The hearing loss resolved after 4 hours---so I don't think he has sudden sensorineural hearing loss No neuro findings to suggest CVA  Will give Rx for meclizine 25 for prn use  ENT if things worsen

## 2020-04-22 NOTE — Progress Notes (Signed)
Subjective:    Patient ID: [name], male    DOB: 06-08-1950, 69 y.o.   MRN: [id number]  HPI Here for dizziness and hearing loss This visit occurred during the SARS-CoV-2 public health emergency.  Safety protocols were in place, including screening questions prior to the visit, additional usage of staff PPE, and extensive cleaning of exam room while observing appropriate contact time as indicated for disinfecting solutions.   2 mornings ago---couldn't hear anything out of right ear Got up out of bed and was listing to the left--very off balance Hearing did improve over 4 hours--now feels it is fine Balance is better---unsteady when he first gets up--then does okay Did have some true rotary vertigo when he lay down in bed 2 nights ago----better after he closed eyes and re-opened them. Recurred that night when going to the bathroom  Current Outpatient Medications on File Prior to Visit  Medication Sig Dispense Refill  . aspirin 81 MG tablet Take 81 mg by mouth daily.    Marland Kitchen BLACK ELDERBERRY PO Take 1 each by mouth daily.    . carvedilol (COREG) 3.125 MG tablet Take 1 tablet (3.125 mg total) by mouth 2 (two) times daily. 180 tablet 0  . clopidogrel (PLAVIX) 75 MG tablet Take 1 tablet (75 mg total) by mouth daily. 90 tablet 1  . ezetimibe (ZETIA) 10 MG tablet Take 1 tablet (10 mg total) by mouth daily. 90 tablet 1  . isosorbide mononitrate (IMDUR) 30 MG 24 hr tablet TAKE HALF TABLETS (15 MG TOTAL) BY MOUTH AT BEDTIME. 45 tablet 1  . mirtazapine (REMERON) 15 MG tablet TAKE 1 TABLET BY MOUTH EVERYDAY AT BEDTIME 90 tablet 3  . Multiple Vitamins-Minerals (MULTIVITAMIN WITH MINERALS) tablet Take 1 tablet by mouth daily.    . nitroGLYCERIN (NITROSTAT) 0.4 MG SL tablet Place 1 tablet (0.4 mg total) under the tongue every 5 (five) minutes as needed for chest pain. 25 tablet 2  . omeprazole (PRILOSEC) 20 MG capsule Take 20 mg by mouth daily.    . rosuvastatin (CRESTOR) 40 MG tablet Take 1 tablet  (40 mg total) by mouth daily. 90 tablet 0   No current facility-administered medications on file prior to visit.    Allergies  Allergen Reactions  . Metoprolol Tartrate Hives and Itching  . Metoclopramide Nausea Only    Other reaction(s): Unknown    Past Medical History:  Diagnosis Date  . AAA (abdominal aortic aneurysm) ([place])    a. Duplex 01/2012: stable infrarenal saccular AAA at 3.3cm x 3.2xm (f/u recommended 01/2013 per Dr. Rockey Situ)  . Alcohol abuse   . CAD (coronary artery disease)    a. 1995 s/p CABG x 4: LIMA->LAD, VG->RI (known to be occluded), VG->AM->PDA;  b. 05/2002 Inf STEMI: VG->AM->PDA 100% treated w/ 2 BMS complicated by acute thrombosis req 3 BMS;  c. 07/2003 DES to  LAD & LCX (VG's to PDA & RI 100%);  d. 12/2010 Acute [place] ([place]): DES to LCX & LM , LIMA ok,;  e.12/2011 Cath: LM/LCX stents ok , LIMA patent.  . Cardiomyopathy ([place])    a. EF 35% by cath 2015.  . Diverticulitis    1/06 Diverticulitis--CT of pelvis--diffuse sigmoid divertic  . Dyspnea   . GERD (gastroesophageal reflux disease)   . Hyperlipidemia   . Hypertension   . Myocardial infarction ([place])   . PAD (peripheral artery disease) ([facility])    a. external iliac and mesenteric stenosis noted by noninvasive imaging.  . Renal artery stenosis ([place])  a. 03/2011 PTA and stenting of L RA. b. last duplex 2016 with stable 1-59% bilateral RAS, incidental >50% R EIA stenosis    Past Surgical History:  Procedure Laterality Date  . ANGIOPLASTY  1997  . CARDIAC CATHETERIZATION     8/09  Cath--vein graft occlusions which are old--no acute changes  . CARDIAC CATHETERIZATION  11/13/2010   stent x 2 @ [place]  . CARDIAC CATHETERIZATION  04/05/2014   stent placement   . CARDIAC CATHETERIZATION  10/28/2017  . CLOSED REDUCTION SHOULDER DISLOCATION    . COLONOSCOPY WITH PROPOFOL N/A 12/22/2018   Procedure: COLONOSCOPY WITH PROPOFOL;  Surgeon: Wyline Mood, MD;  Location: [facility] ENDOSCOPY;  Service: Gastroenterology;  Laterality:  N/A;  . CORONARY ANGIOPLASTY  04/05/2014   stent placement OM 1  . CORONARY ARTERY BYPASS GRAFT    . CORONARY STENT PLACEMENT  7/12   2 stents--Promus element plus (everolimus eluting)--[facility] in [place]  . LEFT HEART CATH AND CORS/GRAFTS ANGIOGRAPHY N/A 10/28/2017   Procedure: LEFT HEART CATH AND CORS/GRAFTS ANGIOGRAPHY;  Surgeon: Marykay Lex, MD;  Location: [facility] INVASIVE CV LAB;  Service: Cardiovascular;  Laterality: N/A;  . LEFT HEART CATHETERIZATION WITH CORONARY/GRAFT ANGIOGRAM N/A 01/04/2012   Procedure: LEFT HEART CATHETERIZATION WITH Isabel Caprice;  Surgeon: Kathleene Hazel, MD;  Location: [facility] CATH LAB;  Service: Cardiovascular;  Laterality: N/A;  . LEFT HEART CATHETERIZATION WITH CORONARY/GRAFT ANGIOGRAM N/A 04/05/2014   Procedure: LEFT HEART CATHETERIZATION WITH Isabel Caprice;  Surgeon: Corky Crafts, MD;  Location: [facility] CATH LAB;  Service: Cardiovascular;  Laterality: N/A;  . PERCUTANEOUS CORONARY STENT INTERVENTION (PCI-S)  04/05/2014   Procedure: PERCUTANEOUS CORONARY STENT INTERVENTION (PCI-S);  Surgeon: Corky Crafts, MD;  Location: [facility] CATH LAB;  Service: Cardiovascular;;  OM1  . RENAL ANGIOGRAM N/A 04/16/2011   Procedure: RENAL ANGIOGRAM;  Surgeon: Kathleene Hazel, MD;  Location: [facility] CATH LAB;  Service: Cardiovascular;  Laterality: N/A;  . RENAL ARTERY STENT  03/2011 ?    Family History  Problem Relation Age of Onset  . Coronary artery disease Mother        Died MI age 20  . Hypertension Mother   . Heart attack Mother   . Coronary artery disease Sister        Living  . Coronary artery disease Brother        Living  . Coronary artery disease Father        Died MI age 37  . Heart attack Father   . Heart attack Maternal Grandfather   . Diabetes Neg Hx   . Cancer Neg Hx        prostate or colon    Social History   Socioeconomic History  . Marital status: Married    Spouse name: Not on file  . Number of  children: 3  . Years of education: Not on file  . Highest education level: Not on file  Occupational History  . Occupation: Merchandiser, retail (Restaurant manager, fast food)    Comment: Retired  Tobacco Use  . Smoking status: Former Smoker    Packs/day: 3.00    Years: 25.00    Pack years: 75.00    Types: Cigarettes    Quit date: 05/28/1993    Years since quitting: 26.9  . Smokeless tobacco: Never Used  Vaping Use  . Vaping Use: Never used  Substance and Sexual Activity  . Alcohol use: Yes    Alcohol/week: 42.0 standard drinks    Types: 42 Cans of  beer per week  . Drug use: No  . Sexual activity: Not on file  Other Topics Concern  . Not on file  Social History Narrative   No living will   No health care POA but requests wife--then children   Would accept resuscitation   Not sure about tube feeds   Social Determinants of Health   Financial Resource Strain: Not on file  Food Insecurity: Not on file  Transportation Needs: Not on file  Physical Activity: Not on file  Stress: Not on file  Social Connections: Not on file  Intimate Partner Violence: Not on file   Review of Systems  No ringing in ears     Objective:   Physical Exam Constitutional:      Appearance: Normal appearance.  HENT:     Right Ear: Tympanic membrane, ear canal and external ear normal.     Left Ear: Tympanic membrane, ear canal and external ear normal.  Musculoskeletal:     Cervical back: Neck supple.  Lymphadenopathy:     Cervical: No cervical adenopathy.  Neurological:     Mental Status: He is alert.     Cranial Nerves: Cranial nerves are intact.     Motor: No weakness, tremor or abnormal muscle tone.     Coordination: Coordination is intact. Romberg sign negative. Coordination normal.     Gait: Gait is intact.     Comments: Careful walking but no true ataxia (cautious)            Assessment & Plan:

## 2020-04-27 ENCOUNTER — Other Ambulatory Visit: Payer: Self-pay | Admitting: Cardiovascular Disease

## 2020-04-29 ENCOUNTER — Other Ambulatory Visit: Payer: Self-pay | Admitting: *Deleted

## 2020-04-29 MED ORDER — ISOSORBIDE MONONITRATE ER 30 MG PO TB24: ORAL_TABLET | ORAL | 3 refills | Status: DC

## 2020-04-29 NOTE — Telephone Encounter (Signed)
Pharmacy sent request to clarify how many half tablets patient is to take at bedtime even though the prescription state 15 mg at bedtime. New rx with clearer directions sent to pharmacy.

## 2020-06-09 ENCOUNTER — Encounter: Payer: Self-pay | Admitting: Family Medicine

## 2020-06-09 ENCOUNTER — Ambulatory Visit (INDEPENDENT_AMBULATORY_CARE_PROVIDER_SITE_OTHER): Payer: Medicare Other | Admitting: Family Medicine

## 2020-06-09 ENCOUNTER — Other Ambulatory Visit: Payer: Self-pay

## 2020-06-09 ENCOUNTER — Telehealth: Payer: Self-pay | Admitting: Internal Medicine

## 2020-06-09 VITALS — BP 130/80 | HR 67 | Temp 97.9°F | Ht 63.5 in | Wt 166.2 lb

## 2020-06-09 DIAGNOSIS — K5792 Diverticulitis of intestine, part unspecified, without perforation or abscess without bleeding: Secondary | ICD-10-CM

## 2020-06-09 DIAGNOSIS — R1032 Left lower quadrant pain: Secondary | ICD-10-CM

## 2020-06-09 MED ORDER — AMOXICILLIN-POT CLAVULANATE 875-125 MG PO TABS: 1.0000 | ORAL_TABLET | Freq: Two times a day (BID) | ORAL | 0 refills | Status: AC

## 2020-06-09 NOTE — Telephone Encounter (Signed)
Spoke to pt. He is seeing Dr Lorelei Pont today at 1140. I advised him he needs to do that visit as Dr Silvio Pate is out of the office today.

## 2020-06-09 NOTE — Progress Notes (Signed)
Kiyonna Tortorelli T. Rayaan Lorah, MD, [place]  Primary Care and [place] at [facility] [place], [address]  Phone: [phone]  FAX: [phone]  [name] - 70 y.o. male  MRN [id number]  Date of Birth: February 04, 1951  Date: 06/09/2020  PCP: Venia Carbon, MD  Referral: Venia Carbon, MD  Chief Complaint  Patient presents with  . Diverticulitis    LLQ Pain    This visit occurred during the SARS-CoV-2 public health emergency.  Safety protocols were in place, including screening questions prior to the visit, additional usage of staff PPE, and extensive cleaning of exam room while observing appropriate contact time as indicated for disinfecting solutions.   Subjective:   [name] is a 70 y.o. very pleasant male patient with Body mass index is 28.99 kg/m. who presents with the following:  H/o diverticulitis: He is a pleasant gentleman with some heart problems and he presents with some left-sided lower abdominal pain.  Aside from this, he denies any other symptoms including no upper respiratory symptoms, cough, earache, sore throat, diarrhea.  He also does not have any blood in stool.  He has been treated in the past with Augmentin.  Surgical history:   He has no significant intra-abdominal surgical history.  Review of Systems is noted in the HPI, as appropriate  Objective:   BP 130/80   Pulse 67   Temp 97.9 F (36.6 C) (Temporal)   Ht 5' 3.5" (1.613 m)   Wt 166 lb 4 oz (75.4 kg)   SpO2 99%   BMI 28.99 kg/m   GEN: No acute distress; alert,appropriate. PULM: Breathing comfortably in no respiratory distress PSYCH: Normally interactive.  CV: RRR, no m/g/r  ABD: S, left lower quadrant tenderness, ND, + BS, No rebound, No HSM   Laboratory and Imaging Data:  Assessment and Plan:     ICD-10-CM   1. Acute diverticulitis  K57.92   2. Left lower quadrant pain  R10.32    Acute diverticulitis  in the setting of prior diverticulitis.  He does understand that at times diverticulitis can present to the point where of requiring hospitalization, but he hopefully will do fine.  He is aware of red flags.  Meds ordered this encounter  Medications  . amoxicillin-clavulanate (AUGMENTIN) 875-125 MG tablet    Sig: Take 1 tablet by mouth 2 (two) times daily for 10 days.    Dispense:  20 tablet    Refill:  0    Signed,  Tyisha Cressy T. Hajra Port, MD   Outpatient Encounter Medications as of 06/09/2020  Medication Sig  . amoxicillin-clavulanate (AUGMENTIN) 875-125 MG tablet Take 1 tablet by mouth 2 (two) times daily for 10 days.  Marland Kitchen aspirin 81 MG tablet Take 81 mg by mouth daily.  Marland Kitchen BLACK ELDERBERRY PO Take 1 each by mouth daily.  . carvedilol (COREG) 3.125 MG tablet Take 1 tablet (3.125 mg total) by mouth 2 (two) times daily.  . clopidogrel (PLAVIX) 75 MG tablet Take 1 tablet (75 mg total) by mouth daily.  Marland Kitchen ezetimibe (ZETIA) 10 MG tablet Take 1 tablet (10 mg total) by mouth daily.  . isosorbide mononitrate (IMDUR) 30 MG 24 hr tablet TAKE ONE HALF A TABLET  (15 MG TOTAL) BY MOUTH AT BEDTIME.  . meclizine (ANTIVERT) 25 MG tablet Take 1 tablet (25 mg total) by mouth 3 (three) times daily as needed for dizziness.  . mirtazapine (REMERON) 15 MG tablet TAKE 1  TABLET BY MOUTH EVERYDAY AT BEDTIME  . Multiple Vitamins-Minerals (MULTIVITAMIN WITH MINERALS) tablet Take 1 tablet by mouth daily.  . nitroGLYCERIN (NITROSTAT) 0.4 MG SL tablet Place 1 tablet (0.4 mg total) under the tongue every 5 (five) minutes as needed for chest pain.  Marland Kitchen omeprazole (PRILOSEC) 20 MG capsule Take 20 mg by mouth daily.  . rosuvastatin (CRESTOR) 40 MG tablet Take 1 tablet (40 mg total) by mouth daily.   No facility-administered encounter medications on file as of 06/09/2020.

## 2020-06-09 NOTE — Telephone Encounter (Signed)
Pt wife called in wanted to know about getting something is dirverticulis

## 2020-07-13 NOTE — Progress Notes (Signed)
Date:  07/15/2020   ID:  [name], DOB [birth year], MRN [id number]  Patient Location:  [address] [place] [address]   Provider location:   [facility], [place] office  PCP:  Venia Carbon, MD  Cardiologist:  Arvid Right [facility]   Chief Complaint  Patient presents with  . Follow-up    6 Months follow up. Medications verbally reviewed with patient.     History of Present Illness:    [name] is a 70 y.o. male  past medical history of coronary artery disease, bypass surgery in 1995,  stenting at [facility] in February 2004 for MI with a 3.0 x 25 mm and 4.5 x 16 mm Monorail ( location uncertain), also 4.5 x 18 mm stent placed to the mid RCA, followup with repeat stenting in April 2005 to the proximal LAD with a Taxus stent 2.5 x 8 mm, and stenting to the proximal left circumflex with a 2.5 x 12 mm Taxus, with  stent placed November 13, 2010 with a 4.0 x 9 mm stent placed to the left main. renal artery stent.  smoker though stopped in 1995. Prior to that he smoked 3 packs per day. Smoked for approximately 25-28 years 4.2 cm AAA on CT scan 10/2019 He presents for routine followup of his coronary artery disease.  Last seen in clinic January 22, 2020  emergency room January 19, 2020  Was carrying lumber, developed lightheadedness, malaise, pale Work-up in the emergency room unrevealing TNT normal x3, sodium 133  Active, last clinic visit was building deck stairs Some low blood pressure possibly from poor fluid intake Would recommend he stay hydrated, hold lisinopril for low blood pressure He is taking half tablet isosorbide in the evenings  Carotid ultrasound October 2021 with 40 to 59% disease/stenosis bilaterally,  Remodelled inside of house, flooring, painting Getting over meclizine Now working in the garden  Numb hands at night b/l, Sometimes in the day  EKG personally reviewed by myself on todays  visit NSR rate 62 bpm, no ST or T wave changes frequent PVCs  Other past medical history reviewed Aorta u/s 10/2018 Stable size, 4.2 cm  Unchanged, discussed  Cardiac cath 10/25/2017 No stents  Known Severe Multi-vessel CAD: known CTO of ostRCA, mLAD & SVG-AM-PDA, SVG-OM.  Widely patent LIMA-LAD - retograde fills to 100% CTO, antegrade flow brisk to the Apex.  Prox LM ~40% (stable lesion just prior to the stent) followed by widely patent stents running from pLM-ost-proxCx-OM with brisk flow in both the distal OM & AV Groove Cx --> collaterals to RPL system.  Mldly reduced LVEF with basal-mid Inferior Akinesis.  Normal/Low LVEDP.    04/05/2014,  catheterization. This showed an occluded OM vessel with DES placed 2 Occluded mid LAD with a patent LIMA to the LAD, patent left circumflex, prior bare-metal stent into the OM1 was occluded in the distal portion just after the ostium of OM1. Occluded RCA, ejection fraction 35%   repeat catheterization performed 12/2011 This showed patent stents to the left circumflex and LAD with patent LIMA to the LAD. Vein graft to the OM and vein graft to the RCA was occluded     Past Medical History:  Diagnosis Date  . AAA (abdominal aortic aneurysm) ([place])    a. Duplex 01/2012: stable infrarenal saccular AAA at 3.3cm x 3.2xm (f/u recommended 01/2013 per Dr. Rockey Situ)  . Alcohol abuse   . CAD (coronary artery disease)  a. 1995 s/p CABG x 4: LIMA->LAD, VG->RI (known to be occluded), VG->AM->PDA;  b. 05/2002 Inf STEMI: VG->AM->PDA 100% treated w/ 2 BMS complicated by acute thrombosis req 3 BMS;  c. 07/2003 DES to  LAD & LCX (VG's to PDA & RI 100%);  d. 12/2010 Acute MI ([place]): DES to LCX & LM , LIMA ok,;  e.12/2011 Cath: LM/LCX stents ok , LIMA patent.  . Cardiomyopathy ([place])    a. EF 35% by cath 2015.  . Diverticulitis    1/06 Diverticulitis--CT of pelvis--diffuse sigmoid divertic  . Dyspnea   . GERD (gastroesophageal reflux disease)   . Hyperlipidemia    . Hypertension   . Myocardial infarction ([place])   . PAD (peripheral artery disease) (H[facility])    a. external iliac and mesenteric stenosis noted by noninvasive imaging.  . Renal artery stenosis ([place])    a. 03/2011 PTA and stenting of L RA. b. last duplex 2016 with stable 1-59% bilateral RAS, incidental >50% R EIA stenosis   Past Surgical History:  Procedure Laterality Date  . ANGIOPLASTY  1997  . CARDIAC CATHETERIZATION     8/09  Cath--vein graft occlusions which are old--no acute changes  . CARDIAC CATHETERIZATION  11/13/2010   stent x 2 @ [place]  . CARDIAC CATHETERIZATION  04/05/2014   stent placement   . CARDIAC CATHETERIZATION  10/28/2017  . CLOSED REDUCTION SHOULDER DISLOCATION    . COLONOSCOPY WITH PROPOFOL N/A 12/22/2018   Procedure: COLONOSCOPY WITH PROPOFOL;  Surgeon: Jonathon Bellows, MD;  Location: [facility] ENDOSCOPY;  Service: Gastroenterology;  Laterality: N/A;  . CORONARY ANGIOPLASTY  04/05/2014   stent placement OM 1  . CORONARY ARTERY BYPASS GRAFT    . CORONARY STENT PLACEMENT  7/12   2 stents--Promus element plus (everolimus eluting)--[facility] in [place]  . LEFT HEART CATH AND CORS/GRAFTS ANGIOGRAPHY N/A 10/28/2017   Procedure: LEFT HEART CATH AND CORS/GRAFTS ANGIOGRAPHY;  Surgeon: Leonie Man, MD;  Location: [facility] CV LAB;  Service: Cardiovascular;  Laterality: N/A;  . LEFT HEART CATHETERIZATION WITH CORONARY/GRAFT ANGIOGRAM N/A 01/04/2012   Procedure: LEFT HEART CATHETERIZATION WITH Beatrix Fetters;  Surgeon: Burnell Blanks, MD;  Location: [facility] CATH LAB;  Service: Cardiovascular;  Laterality: N/A;  . LEFT HEART CATHETERIZATION WITH CORONARY/GRAFT ANGIOGRAM N/A 04/05/2014   Procedure: LEFT HEART CATHETERIZATION WITH Beatrix Fetters;  Surgeon: Jettie Booze, MD;  Location: [facility] CATH LAB;  Service: Cardiovascular;  Laterality: N/A;  . PERCUTANEOUS CORONARY STENT INTERVENTION (PCI-S)  04/05/2014   Procedure: PERCUTANEOUS CORONARY STENT  INTERVENTION (PCI-S);  Surgeon: Jettie Booze, MD;  Location: [facility] CATH LAB;  Service: Cardiovascular;;  OM1  . RENAL ANGIOGRAM N/A 04/16/2011   Procedure: RENAL ANGIOGRAM;  Surgeon: Burnell Blanks, MD;  Location: [facility] CATH LAB;  Service: Cardiovascular;  Laterality: N/A;  . RENAL ARTERY STENT  03/2011 ?     Allergies:   Metoprolol tartrate and Metoclopramide   Social History   Tobacco Use  . Smoking status: Former Smoker    Packs/day: 3.00    Years: 25.00    Pack years: 75.00    Types: Cigarettes    Quit date: 05/28/1993    Years since quitting: 27.1  . Smokeless tobacco: Never Used  Vaping Use  . Vaping Use: Never used  Substance Use Topics  . Alcohol use: Yes    Alcohol/week: 42.0 standard drinks    Types: 42 Cans of beer per week  . Drug use: No     Current Outpatient Medications on  File Prior to Visit  Medication Sig Dispense Refill  . aspirin 81 MG tablet Take 81 mg by mouth daily.    Marland Kitchen BLACK ELDERBERRY PO Take 1 each by mouth daily.    . carvedilol (COREG) 3.125 MG tablet Take 1 tablet (3.125 mg total) by mouth 2 (two) times daily. 180 tablet 0  . clopidogrel (PLAVIX) 75 MG tablet Take 1 tablet (75 mg total) by mouth daily. 90 tablet 1  . ezetimibe (ZETIA) 10 MG tablet Take 1 tablet (10 mg total) by mouth daily. 90 tablet 1  . isosorbide mononitrate (IMDUR) 30 MG 24 hr tablet TAKE ONE HALF A TABLET  (15 MG TOTAL) BY MOUTH AT BEDTIME. 45 tablet 3  . meclizine (ANTIVERT) 25 MG tablet Take 1 tablet (25 mg total) by mouth 3 (three) times daily as needed for dizziness. 30 tablet 2  . mirtazapine (REMERON) 15 MG tablet TAKE 1 TABLET BY MOUTH EVERYDAY AT BEDTIME 90 tablet 3  . Multiple Vitamins-Minerals (MULTIVITAMIN WITH MINERALS) tablet Take 1 tablet by mouth daily.    . nitroGLYCERIN (NITROSTAT) 0.4 MG SL tablet Place 1 tablet (0.4 mg total) under the tongue every 5 (five) minutes as needed for chest pain. 25 tablet 2  . omeprazole (PRILOSEC) 20 MG capsule Take 20  mg by mouth daily.    . rosuvastatin (CRESTOR) 40 MG tablet Take 1 tablet (40 mg total) by mouth daily. 90 tablet 0   No current facility-administered medications on file prior to visit.     Family Hx: The patient's family history includes Coronary artery disease in his brother, father, mother, and sister; Heart attack in his father, maternal grandfather, and mother; Hypertension in his mother. There is no history of Diabetes or Cancer.  ROS:   Please see the history of present illness.    Review of Systems  Constitutional: Negative.   HENT: Negative.   Respiratory: Negative.   Cardiovascular: Negative.   Gastrointestinal: Negative.   Musculoskeletal: Negative.   Neurological: Negative.   Psychiatric/Behavioral: Negative.   All other systems reviewed and are negative.    Labs/Other Tests and Data Reviewed:    Recent Labs: 01/18/2020: BUN 6; Creatinine, Ser 0.72; Hemoglobin 13.1; Platelets 305; Potassium 4.3; Sodium 133   Recent Lipid Panel Lab Results  Component Value Date/Time   CHOL 169 10/19/2018 02:05 PM   CHOL 163 08/11/2015 09:41 AM   TRIG (H) 10/19/2018 02:05 PM    492.0 Triglyceride is over 400; calculations on Lipids are invalid.   HDL 44.30 10/19/2018 02:05 PM   HDL 46 08/11/2015 09:41 AM   CHOLHDL 4 10/19/2018 02:05 PM   LDLCALC 24 10/29/2017 02:33 AM   LDLCALC 63 01/07/2017 11:20 AM   LDLDIRECT 62.0 10/19/2018 02:05 PM    Wt Readings from Last 3 Encounters:  07/15/20 170 lb (77.1 kg)  06/09/20 166 lb 4 oz (75.4 kg)  04/22/20 167 lb (75.8 kg)     Exam:    Vital Signs: Vital signs may also be detailed in the HPI BP 120/82 (BP Location: Left Arm, Patient Position: Sitting, Cuff Size: Normal)   Pulse 67   Ht 5' 3.5" (1.613 m)   Wt 170 lb (77.1 kg)   SpO2 98%   BMI 29.64 kg/m   Constitutional:  oriented to person, place, and time. No distress.  HENT:  Head: Grossly normal Eyes:  no discharge. No scleral icterus.  Neck: No JVD, no carotid bruits   Cardiovascular: Regular rate and rhythm, no murmurs appreciated Pulmonary/Chest:  Clear to auscultation bilaterally, no wheezes or rails Abdominal: Soft.  no distension.  no tenderness.  Musculoskeletal: Normal range of motion Neurological:  normal muscle tone. Coordination normal. No atrophy Skin: Skin warm and dry Psychiatric: normal affect, pleasant   ASSESSMENT & PLAN:    Problem List Items Addressed This Visit      Cardiology Problems   Coronary artery disease involving native coronary artery of native heart with angina pectoris ([place]) - Primary   Relevant Orders   EKG 12-Lead   AAA (abdominal aortic aneurysm) without rupture (HCC)   Renal artery stenosis ([facility])   Peripheral vascular disease ([place])   Relevant Orders   EKG 12-Lead   Orthostasis   Essential hypertension   Relevant Orders   EKG 12-Lead   Hyperlipidemia LDL goal <70     CAD Currently with no symptoms of angina. No further workup at this time. Continue current medication regimen.  Lightheaded spells Suspect orthostasis, none recently Blood pressure stable, Recommend he stay hydrated through the summer  PAD: AAA, stable 4.2 cm , last evaluated July 2021, unchanged No recent lipid panel Renal artery stenosis: prior stent on left Repeat u/s ordered for late 2023 Carotid u/s and abd aorta for AAA, and renal artery u/s in 10/2021  ETOH Cessation recommended  Essential hypertension  lisinopril, stay on carvedilol and low-dose Imdur 15 daily Blood pressure stable Stay hydrated  Hyperlipidemia No recent lipid panel available   Total encounter time more than 25 minutes  Greater than 50% was spent in counseling and coordination of care with the patient   Signed, Ida Rogue, [place] [id number], [place], [place] [address]

## 2020-07-15 ENCOUNTER — Other Ambulatory Visit: Payer: Self-pay

## 2020-07-15 ENCOUNTER — Ambulatory Visit (INDEPENDENT_AMBULATORY_CARE_PROVIDER_SITE_OTHER): Payer: Medicare Other | Admitting: Cardiovascular Disease

## 2020-07-15 ENCOUNTER — Encounter: Payer: Self-pay | Admitting: Cardiovascular Disease

## 2020-07-15 VITALS — BP 120/82 | HR 67 | Ht 63.5 in | Wt 170.0 lb

## 2020-07-15 DIAGNOSIS — I739 Peripheral vascular disease, unspecified: Secondary | ICD-10-CM | POA: Diagnosis not present

## 2020-07-15 DIAGNOSIS — I25119 Atherosclerotic heart disease of native coronary artery with unspecified angina pectoris: Secondary | ICD-10-CM | POA: Diagnosis not present

## 2020-07-15 DIAGNOSIS — I951 Orthostatic hypotension: Secondary | ICD-10-CM | POA: Diagnosis not present

## 2020-07-15 DIAGNOSIS — I714 Abdominal aortic aneurysm, without rupture, unspecified: Secondary | ICD-10-CM

## 2020-07-15 DIAGNOSIS — I701 Atherosclerosis of renal artery: Secondary | ICD-10-CM

## 2020-07-15 DIAGNOSIS — E785 Hyperlipidemia, unspecified: Secondary | ICD-10-CM | POA: Diagnosis not present

## 2020-07-15 DIAGNOSIS — I10 Essential (primary) hypertension: Secondary | ICD-10-CM

## 2020-07-15 NOTE — Patient Instructions (Addendum)
Carotid u/s and abd aorta for AAA, and renal artery u/s in 10/2021  Medication Instructions:  No changes  If you need a refill on your cardiac medications before your next appointment, please call your pharmacy.    Lab work: No new labs needed   If you have labs (blood work) drawn today and your tests are completely normal, you will receive your results only by: Marland Kitchen MyChart Message (if you have MyChart) OR . A paper copy in the mail If you have any lab test that is abnormal or we need to change your treatment, we will call you to review the results.   Testing/Procedures: No new testing needed   Follow-Up: At [facility], you and your health needs are our priority.  As part of our continuing mission to provide you with exceptional heart care, we have created designated Provider Care Teams.  These Care Teams include your primary Cardiologist (physician) and Advanced Practice Providers (APPs -  Physician Assistants and Nurse Practitioners) who all work together to provide you with the care you need, when you need it.  . You will need a follow up appointment in 12 months  . Providers on your designated Care Team:   . Murray Hodgkins, NP . Christell Faith, PA-C . Marrianne Mood, PA-C  Any Other Special Instructions Will Be Listed Below (If Applicable).  COVID-19 Vaccine Information can be found at: ShippingScam.co.uk For questions related to vaccine distribution or appointments, please email [email or web address] or call [phone])[phone].

## 2020-08-15 ENCOUNTER — Other Ambulatory Visit: Payer: Self-pay

## 2020-08-15 MED ORDER — ROSUVASTATIN CALCIUM 40 MG PO TABS
40.0000 mg | ORAL_TABLET | Freq: Every day | ORAL | 3 refills | Status: DC
Start: 2020-08-15 — End: 2021-06-04

## 2020-08-15 NOTE — Telephone Encounter (Signed)
*  STAT* If patient is at the pharmacy, call can be transferred to refill team.   1. Which medications need to be refilled? (please list name of each medication and dose if known) Crestor  2. Which pharmacy/location (including street and city if local pharmacy) is medication to be sent to? [facility]  3. Do they need a 30 day or 90 day supply? [place]

## 2020-09-02 ENCOUNTER — Other Ambulatory Visit: Payer: Self-pay | Admitting: Cardiovascular Disease

## 2020-09-02 NOTE — Telephone Encounter (Signed)
Rx request sent to pharmacy.  

## 2020-09-17 ENCOUNTER — Other Ambulatory Visit: Payer: Self-pay

## 2020-09-17 ENCOUNTER — Ambulatory Visit (INDEPENDENT_AMBULATORY_CARE_PROVIDER_SITE_OTHER): Payer: Medicare Other | Admitting: Family Medicine

## 2020-09-17 ENCOUNTER — Ambulatory Visit (INDEPENDENT_AMBULATORY_CARE_PROVIDER_SITE_OTHER)
Admission: RE | Admit: 2020-09-17 | Discharge: 2020-09-17 | Disposition: A | Discharge status: Home / Self Care | Payer: Medicare Other | Source: Ambulatory Visit | Attending: Family Medicine | Admitting: Family Medicine

## 2020-09-17 ENCOUNTER — Encounter: Payer: Self-pay | Admitting: Family Medicine

## 2020-09-17 VITALS — BP 130/80 | HR 58 | Temp 97.3°F | Ht 63.5 in | Wt 172.0 lb

## 2020-09-17 DIAGNOSIS — M47816 Spondylosis without myelopathy or radiculopathy, lumbar region: Secondary | ICD-10-CM | POA: Diagnosis not present

## 2020-09-17 DIAGNOSIS — I701 Atherosclerosis of renal artery: Secondary | ICD-10-CM

## 2020-09-17 DIAGNOSIS — M25551 Pain in right hip: Secondary | ICD-10-CM | POA: Diagnosis not present

## 2020-09-17 DIAGNOSIS — I739 Peripheral vascular disease, unspecified: Secondary | ICD-10-CM | POA: Diagnosis not present

## 2020-09-17 DIAGNOSIS — M25561 Pain in right knee: Secondary | ICD-10-CM | POA: Diagnosis not present

## 2020-09-17 DIAGNOSIS — Z951 Presence of aortocoronary bypass graft: Secondary | ICD-10-CM | POA: Diagnosis not present

## 2020-09-17 DIAGNOSIS — G8929 Other chronic pain: Secondary | ICD-10-CM

## 2020-09-17 DIAGNOSIS — M1611 Unilateral primary osteoarthritis, right hip: Secondary | ICD-10-CM | POA: Diagnosis not present

## 2020-09-17 MED ORDER — TRIAMCINOLONE ACETONIDE 40 MG/ML IJ SUSP
40.0000 mg | Freq: Once | INTRAMUSCULAR | Status: AC
  Administered 2020-09-17: 40 mg via INTRA_ARTICULAR

## 2020-09-17 NOTE — Progress Notes (Signed)
[name], MD, [place]  [facility] and [place] at [facility] [place], [address]  Phone: [phone]  FAX: [phone]  [name] - 70 y.o. male  MRN [id number]  Date of Birth: 01/29/1951  Date: 09/17/2020  PCP: Venia Carbon, MD  Referral: Venia Carbon, MD  Chief Complaint  Patient presents with  . Knee Pain    Right  . Hip Pain    Right    This visit occurred during the SARS-CoV-2 public health emergency.  Safety protocols were in place, including screening questions prior to the visit, additional usage of staff PPE, and extensive cleaning of exam room while observing appropriate contact time as indicated for disinfecting solutions.   Subjective:   [name] is a 70 y.o. very pleasant male patient with Body mass index is 29.99 kg/m. who presents with the following:  Long time - years with his knees.  25+ years with his knees. Pain on the joint lines.  This is bilateral, but notably worse on the right.  He has not had any significant traumatic injury on this side, now or in the past.  No prior fracture, surgeries or anything else significant with his knees.  He does have some pain with getting up from a seated position and going up and down stairs.  He is not having any mechanical symptoms or giving way.  He has tried some Tylenol and Voltaren gel.  He is on Plavix, limiting his use of NSAIDs.  He understands this.  He does have a history of coronary disease, CABG, and peripheral vascular disease.  He thinks that his pain might get worse with ambulation.  He also does have pain in the right posterior buttocks without radiculopathy, no significant pain in the back, and some occasional right-sided groin pain.  R posterior buttocks.   Voltaren gel  And Tylenol  Joint spaces look pretty good B  Diffuse - calcification of femoral arteries  r knee injection  Review  of Systems is noted in the HPI, as appropriate   Objective:   BP (!) 178/90   Pulse (!) 58   Temp (!) 97.3 F (36.3 C) (Temporal)   Ht 5' 3.5" (1.613 m)   Wt 172 lb (78 kg)   SpO2 99%   BMI 29.99 kg/m    Knee: Right Gait: Normal heel toe pattern, minimally antalgic ROM: 0-1 30 Effusion: neg Echymosis or edema: none Patellar tendon NT Painful PLICA: neg Patellar grind: Positive Medial and lateral patellar facet loading: negative medial and lateral joint lines: Minimally painful Mcmurray's neg Flexion-pinch neg Varus and valgus stress: stable Lachman: neg Ant and Post drawer: neg Hip abduction, IR, ER: WNL Hip flexion str: 5/5 Hip abd: 5/5 Quad: 5/5 VMO atrophy:No Hamstring concentric and eccentric: 5/5    HIP EXAM: SIDE: Right ROM: Abduction, Flexion, Internal and External range of motion: Full Pain with terminal IROM and EROM: Minimal GTB: NT SLR: NEG Knees: No effusion FABER: NT REVERSE FABER: NT, neg Piriformis: NT at direct palpation Str: flexion: 5/5 abduction: 5/5 adduction: 5/5 Strength testing non-tender     Radiology: DG Knee 4 Views W/Patella Right  Result Date: 09/18/2020 CLINICAL DATA:  Chronic right knee pain. EXAM: RIGHT KNEE - COMPLETE 4+ VIEW COMPARISON:  None. FINDINGS: AP weight-bearing views of both knees were obtained. Total of three views of the right knee were obtained. Right knee is located without a fracture  or large joint effusion. No significant joint space narrowing in the right knee. No significant joint space narrowing in left knee. Surgical clips in the left lower thigh region. Diffuse atherosclerotic calcifications. IMPRESSION: 1. No acute abnormality to the right knee. 2. No significant degenerative disease in the right knee. Electronically Signed   By: Markus Daft M.D.   On: 09/18/2020 13:51   DG HIP UNILAT WITH PELVIS 2-3 VIEWS RIGHT  Result Date: 09/18/2020 CLINICAL DATA:  Hip pain for several years EXAM: DG HIP (WITH OR  WITHOUT PELVIS) 2-3V RIGHT COMPARISON:  CT AP 01/07/2015 FINDINGS: Both hips appear located. There is no acute fracture or subluxation identified. Mild degenerative changes are noted within both hips including subchondral sclerosis. Degenerative changes noted within the imaged portions of the lower lumbar spine. Atherosclerotic calcifications incidentally noted. IMPRESSION: 1. No acute findings. 2. Mild bilateral hip osteoarthritis. Electronically Signed   By: Kerby Moors M.D.   On: 09/18/2020 13:50     Assessment and Plan:     ICD-10-CM   1. Chronic pain of right knee  M25.561 DG Knee 4 Views W/Patella Right   G89.29 triamcinolone acetonide (KENALOG-40) injection 40 mg  2. Chronic right hip pain  M25.551 DG HIP UNILAT WITH PELVIS 2-3 VIEWS RIGHT   G89.29   3. Peripheral arterial disease (HCC)  I73.9 VAS [place] LOWER EXT ART SEG MULTI (SEGMENTALS & LE RAYNAUDS)  4. Hx of CABG  Z95.1 VAS [place] LOWER EXT ART SEG MULTI (SEGMENTALS & LE RAYNAUDS)   Acute on chronic knee and hip pain in the setting of chronic knee pain and chronic hip pain.  Minimal degenerative changes on plain films, but with clinical history, I suspect there is some arthritis intra-articularly with 25 years of use.  He is on Plavix, and anti-inflammatory use has to be limited.  Make some scheduled Tylenol would help as well as some topical Voltaren.    As a diagnostic and therapeutic trial, I am going to inject his right knee which is bothering him quite a bit right now.  Aspiration/Injection Procedure Note MELBOURNE JAKUBIAK Jan 08, 1951 Date of procedure: 09/17/2020  Procedure: Large Joint Aspiration / Injection of Knee, R Indications: Pain  Procedure Details Patient verbally consented to procedure. Risks, benefits, and alternatives explained. Sterilely prepped with Chloraprep. Ethyl cholride used for anesthesia. 9 cc Lidocaine 1% mixed with 1 mL of Kenalog 40 mg injected using the anteromedial approach without difficulty. No  complications with procedure and tolerated well. Patient had decreased pain post-injection. Medication: 1 mL of Kenalog 40 mg  Meds ordered this encounter  Medications  . triamcinolone acetonide (KENALOG-40) injection 40 mg   There are no discontinued medications. Orders Placed This Encounter  Procedures  . DG Knee 4 Views W/Patella Right  . DG HIP UNILAT WITH PELVIS 2-3 VIEWS RIGHT  . VAS [place] LOWER EXT ART SEG MULTI (SEGMENTALS & LE RAYNAUDS)    Follow-up: No follow-ups on file.  Signed,  Maud Deed. Perina Salvaggio, MD   Outpatient Encounter Medications as of 09/17/2020  Medication Sig  . aspirin 81 MG tablet Take 81 mg by mouth daily.  Marland Kitchen BLACK ELDERBERRY PO Take 1 each by mouth daily.  . carvedilol (COREG) 3.125 MG tablet Take 1 tablet (3.125 mg total) by mouth 2 (two) times daily.  . clopidogrel (PLAVIX) 75 MG tablet TAKE 1 TABLET (75 MG TOTAL) BY MOUTH DAILY.  Marland Kitchen ezetimibe (ZETIA) 10 MG tablet TAKE 1 TABLET (10 MG TOTAL) BY MOUTH DAILY.  . isosorbide  mononitrate (IMDUR) 30 MG 24 hr tablet TAKE ONE HALF A TABLET  (15 MG TOTAL) BY MOUTH AT BEDTIME.  . meclizine (ANTIVERT) 25 MG tablet Take 1 tablet (25 mg total) by mouth 3 (three) times daily as needed for dizziness.  . mirtazapine (REMERON) 15 MG tablet TAKE 1 TABLET BY MOUTH EVERYDAY AT BEDTIME  . Multiple Vitamins-Minerals (MULTIVITAMIN WITH MINERALS) tablet Take 1 tablet by mouth daily.  . nitroGLYCERIN (NITROSTAT) 0.4 MG SL tablet Place 1 tablet (0.4 mg total) under the tongue every 5 (five) minutes as needed for chest pain.  Marland Kitchen omeprazole (PRILOSEC) 20 MG capsule Take 20 mg by mouth daily.  . rosuvastatin (CRESTOR) 40 MG tablet Take 1 tablet (40 mg total) by mouth daily.  . [EXPIRED] triamcinolone acetonide (KENALOG-40) injection 40 mg    No facility-administered encounter medications on file as of 09/17/2020.

## 2020-10-06 DIAGNOSIS — Z23 Encounter for immunization: Secondary | ICD-10-CM | POA: Diagnosis not present

## 2020-11-10 ENCOUNTER — Ambulatory Visit (INDEPENDENT_AMBULATORY_CARE_PROVIDER_SITE_OTHER): Payer: Medicare Other

## 2020-11-10 ENCOUNTER — Other Ambulatory Visit: Payer: Self-pay

## 2020-11-10 ENCOUNTER — Other Ambulatory Visit: Payer: Self-pay | Admitting: Internal Medicine

## 2020-11-10 DIAGNOSIS — I739 Peripheral vascular disease, unspecified: Secondary | ICD-10-CM

## 2020-11-10 DIAGNOSIS — Z951 Presence of aortocoronary bypass graft: Secondary | ICD-10-CM | POA: Diagnosis not present

## 2020-12-22 DIAGNOSIS — M25551 Pain in right hip: Secondary | ICD-10-CM | POA: Diagnosis not present

## 2020-12-22 DIAGNOSIS — M25561 Pain in right knee: Secondary | ICD-10-CM | POA: Diagnosis not present

## 2020-12-22 DIAGNOSIS — R202 Paresthesia of skin: Secondary | ICD-10-CM | POA: Diagnosis not present

## 2021-01-12 ENCOUNTER — Telehealth: Payer: Self-pay | Admitting: Cardiovascular Disease

## 2021-01-12 DIAGNOSIS — M545 Low back pain, unspecified: Secondary | ICD-10-CM | POA: Diagnosis not present

## 2021-01-12 DIAGNOSIS — M25551 Pain in right hip: Secondary | ICD-10-CM | POA: Diagnosis not present

## 2021-01-12 DIAGNOSIS — M25561 Pain in right knee: Secondary | ICD-10-CM | POA: Diagnosis not present

## 2021-01-12 DIAGNOSIS — M5416 Radiculopathy, lumbar region: Secondary | ICD-10-CM | POA: Diagnosis not present

## 2021-01-12 DIAGNOSIS — R202 Paresthesia of skin: Secondary | ICD-10-CM | POA: Diagnosis not present

## 2021-01-12 NOTE — Telephone Encounter (Signed)
Patient states he had an xray on his back and Dr. Harlow Mares at [facility] commented on how large his aneurysm and would like to discuss this.

## 2021-01-12 NOTE — Telephone Encounter (Signed)
Was able to reach out to Mr. Dibert, advised hx of AAA measuring 4.1 cm from 10/2017. Would need to have imaging from [facility] for comparison. Pt verbalized understanding, reports Dr. Harlow Mares was concern for it's size stating "I have never seen one that bog", so it has pt very concern.   Pt will have [facility] send imaging over for review, appt schedule with Blanche East 9/23 at 08:30 am.  Carotid u/s and abd aorta for AAA, and renal artery u/s are to be schedule for 10/2021 per Dr. Donivan Scull last [place] notes from 07/15/2020  Fax sent to [facility] requesting imaging

## 2021-01-13 NOTE — Telephone Encounter (Signed)
Patient came by office to drop off disc from [facility] Placed in nurse box

## 2021-01-16 ENCOUNTER — Encounter: Payer: Self-pay | Admitting: Physician Assistant

## 2021-01-16 ENCOUNTER — Other Ambulatory Visit: Payer: Self-pay

## 2021-01-16 ENCOUNTER — Ambulatory Visit (INDEPENDENT_AMBULATORY_CARE_PROVIDER_SITE_OTHER): Payer: Medicare Other | Admitting: Physician Assistant

## 2021-01-16 VITALS — BP 140/80 | HR 66 | Ht 63.0 in | Wt 171.2 lb

## 2021-01-16 DIAGNOSIS — I951 Orthostatic hypotension: Secondary | ICD-10-CM

## 2021-01-16 DIAGNOSIS — I5032 Chronic diastolic (congestive) heart failure: Secondary | ICD-10-CM

## 2021-01-16 DIAGNOSIS — I25119 Atherosclerotic heart disease of native coronary artery with unspecified angina pectoris: Secondary | ICD-10-CM | POA: Diagnosis not present

## 2021-01-16 DIAGNOSIS — I714 Abdominal aortic aneurysm, without rupture, unspecified: Secondary | ICD-10-CM

## 2021-01-16 DIAGNOSIS — I701 Atherosclerosis of renal artery: Secondary | ICD-10-CM

## 2021-01-16 DIAGNOSIS — R0600 Dyspnea, unspecified: Secondary | ICD-10-CM | POA: Diagnosis not present

## 2021-01-16 DIAGNOSIS — I739 Peripheral vascular disease, unspecified: Secondary | ICD-10-CM | POA: Diagnosis not present

## 2021-01-16 DIAGNOSIS — I10 Essential (primary) hypertension: Secondary | ICD-10-CM | POA: Diagnosis not present

## 2021-01-16 NOTE — Progress Notes (Signed)
Office Visit    Patient Name: [name] Date of Encounter: 01/16/2021  PCP:  Venia Carbon, MD   [place]  Cardiologist:  Ida Rogue, MD  Advanced Practice Provider:  No care team member to display Electrophysiologist:  None :[phone]}   Chief Complaint    Chief Complaint  Patient presents with   Other    Discuss AAA. Meds reviewed verbally with pt.    70 y.o. male with history of CAD s/p CABG in 1995 and stenting at [facility] 05/2002, previous history of tobacco use (quit 1995), 4.2 cm AAA on CT scan 10/2019, hypertension, hyperlipidemia, PAD including external iliac and mesenteric stenosis on noninvasive imaging, renal artery stenosis s/p renal artery stenting GERD, and here today to discuss his abdominal aortic aneurysm.  Past Medical History    Past Medical History:  Diagnosis Date   AAA (abdominal aortic aneurysm) ([place])    a. Duplex 01/2012: stable infrarenal saccular AAA at 3.3cm x 3.2xm (f/u recommended 01/2013 per Dr. Rockey Situ)   Alcohol abuse    CAD (coronary artery disease)    a. 1995 s/p CABG x 4: LIMA->LAD, VG->RI (known to be occluded), VG->AM->PDA;  b. 05/2002 Inf STEMI: VG->AM->PDA 100% treated w/ 2 BMS complicated by acute thrombosis req 3 BMS;  c. 07/2003 DES to  LAD & LCX (VG's to PDA & RI 100%);  d. 12/2010 Acute [place] ([place]): DES to LCX & LM , LIMA ok,;  e.12/2011 Cath: LM/LCX stents ok , LIMA patent.   Cardiomyopathy ([place])    a. EF 35% by cath 2015.   Diverticulitis    1/06 Diverticulitis--CT of pelvis--diffuse sigmoid divertic   Dyspnea    GERD (gastroesophageal reflux disease)    Hyperlipidemia    Hypertension    Myocardial infarction [facility])    PAD (peripheral artery disease) ([place])    a. external iliac and mesenteric stenosis noted by noninvasive imaging.   Renal artery stenosis ([place])    a. 03/2011 PTA and stenting of L RA. b. last duplex 2016 with stable 1-59% bilateral RAS, incidental >50% R EIA stenosis    Past Surgical History:  Procedure Laterality Date   [address]     8/09  Cath--vein graft occlusions which are old--no acute changes   CARDIAC CATHETERIZATION  11/13/2010   stent x 2 @ [place]   CARDIAC CATHETERIZATION  04/05/2014   stent placement    CARDIAC CATHETERIZATION  10/28/2017   CLOSED REDUCTION SHOULDER DISLOCATION     COLONOSCOPY WITH PROPOFOL N/A 12/22/2018   Procedure: COLONOSCOPY WITH PROPOFOL;  Surgeon: Jonathon Bellows, MD;  Location: [facility] ENDOSCOPY;  Service: Gastroenterology;  Laterality: N/A;   CORONARY ANGIOPLASTY  04/05/2014   stent placement OM 1   CORONARY ARTERY BYPASS GRAFT     CORONARY STENT PLACEMENT  7/12   2 stents--Promus element plus (everolimus eluting)--[facility] in [place] CATH AND CORS/GRAFTS ANGIOGRAPHY N/A 10/28/2017   Procedure: LEFT HEART CATH AND CORS/GRAFTS ANGIOGRAPHY;  Surgeon: Leonie Man, MD;  Location: [facility] CV LAB;  Service: Cardiovascular;  Laterality: N/A;   LEFT HEART CATHETERIZATION WITH CORONARY/GRAFT ANGIOGRAM N/A 01/04/2012   Procedure: LEFT HEART CATHETERIZATION WITH Beatrix Fetters;  Surgeon: Burnell Blanks, MD;  Location: [facility] CATH LAB;  Service: Cardiovascular;  Laterality: N/A;   LEFT HEART CATHETERIZATION WITH CORONARY/GRAFT ANGIOGRAM N/A 04/05/2014   Procedure: LEFT HEART CATHETERIZATION WITH CORONARY/GRAFT ANGIOGRAM;  Surgeon: Jettie Booze,  MD;  Location: [place] CATH LAB;  Service: Cardiovascular;  Laterality: N/A;   PERCUTANEOUS CORONARY STENT INTERVENTION (PCI-S)  04/05/2014   Procedure: PERCUTANEOUS CORONARY STENT INTERVENTION (PCI-S);  Surgeon: Jettie Booze, MD;  Location: [facility] CATH LAB;  Service: Cardiovascular;;  OM1   RENAL ANGIOGRAM N/A 04/16/2011   Procedure: RENAL ANGIOGRAM;  Surgeon: Burnell Blanks, MD;  Location: [facility] CATH LAB;  Service: Cardiovascular;  Laterality: N/A;   RENAL ARTERY STENT  03/2011 ?     Allergies  Allergies  Allergen Reactions   Metoprolol Tartrate Hives and Itching   Metoclopramide Nausea Only    Other reaction(s): Unknown    History of Present Illness    [name] is a 70 y.o. male with PMH as above.  He has history of CAD s/p bypass surgery 1995 and stenting at [name] 05/2002 for myocardial infarction with stenting to the mid RCA and repeat stenting 07/2003 to the proximal LAD and proximal left circumflex.  10/2010 stenting to the left main.  He is also s/p renal artery stenting.  01/2020 carotid artery ultrasound showed 40 to 59% stenosis bilaterally.  He has history of tobacco use, quitting in 1995.  Prior to quitting, he smoked 3 packs/day for approximately 25 to 28 years.  He has history of abdominal aneurysm with most recent CT 10/2019 showing 4.2 cm size.    He was last seen in clinic 07/15/2020 by his primary cardiologist.  He reported that he was staying active with some low blood pressure, attributed to poor fluid intake.  It was recommended that he stay well-hydrated.  He was instructed to hold lisinopril for low BP.  He was taking half of his Imdur in the evenings.  He was working to remodel the house and working in the garden.  Today, 01/16/2021, he returns to clinic with concern regarding his graft.  He denies any chest pain and reports that his breathing is stable.  He reports lower extremity edema with left always greater than that of right, attributed to his bypass graft harvest.  He reports new bendopnea.  He also reports PND, bloating, and shortness of breath and DOE.  No tachypalpitations.  No signs or symptoms of bleeding.  He does have to stop every once in a while when exerting himself.  He wonders about COPD.  He reports home BP is 130/75. We reviewed his aneurysm. He has some back pain but attributes this to spine issues.  He occasionally lifts heavy objects, such as concrete.  He also reports a history of diverticulitis.  He does  report a desire to keep his current medications and not make any medication changes.  He states that, prior to establishing with Dr. Rockey Situ, he felt poorly for many years.  He states he had a cardiologist that frequently changed his medications.  He states Dr. Rockey Situ simplified his medication regimen and he has felt better ever since, so he is cautious with any changes.  He avoids Lasix, as he does not feel that it works well for him.  Home Medications   Current Outpatient Medications  Medication Instructions   aspirin 81 mg, Daily   BLACK ELDERBERRY PO 1 each, Oral, Daily   carvedilol (COREG) 3.125 mg, Oral, 2 times daily   clopidogrel (PLAVIX) 75 mg, Oral, Daily   ezetimibe (ZETIA) 10 mg, Oral, Daily   isosorbide mononitrate (IMDUR) 30 MG 24 hr tablet TAKE ONE HALF A TABLET  (15 MG TOTAL) BY MOUTH AT BEDTIME.  meclizine (ANTIVERT) 25 MG tablet TAKE 1 TABLET BY MOUTH 3 TIMES DAILY AS NEEDED FOR DIZZINESS.   methylPREDNISolone (MEDROL) 4 MG tablet As directed   mirtazapine (REMERON) 15 MG tablet TAKE 1 TABLET BY MOUTH EVERYDAY AT BEDTIME   Multiple Vitamins-Minerals (MULTIVITAMIN WITH MINERALS) tablet 1 tablet, Oral, Daily   nitroGLYCERIN (NITROSTAT) 0.4 mg, Sublingual, Every 5 min PRN   omeprazole (PRILOSEC) 20 mg, Oral, Daily   rosuvastatin (CRESTOR) 40 mg, Oral, Daily   traMADol (ULTRAM) 50 MG tablet As needed     Review of Systems    He denies chest pain, palpitations, orthopnea, n, v, dizziness, syncope, weight gain, or early satiety. He reports LEE with left greater than right and attributed to the harvesting of his graft, he reports SOB/DOE, bendopnea, PND, and abdominal distention.   All other systems reviewed and are otherwise negative except as noted above.  Physical Exam    VS:  BP 140/80 (BP Location: Right Arm, Patient Position: Sitting, Cuff Size: Normal)   Pulse 66   Ht 5\' 3"  (1.6 m)   Wt 171 lb 4 oz (77.7 kg)   SpO2 99%   BMI 30.34 kg/m  , BMI Body mass index is  30.34 kg/m. GEN: Well nourished, well developed, in no acute distress. HEENT: normal. Neck: Supple, no JVD, carotid bruits, or masses. Cardiac: RRR, no murmurs, rubs, or gallops. No clubbing, cyanosis, LLEE>RLEE and moderate.  Radials/DP/PT 2+ and equal bilaterally.  Respiratory:  Respirations regular and unlabored, bibasilar crackles. GI: Firm, distended, bulge  MS: no deformity or atrophy. Skin: warm and dry, no rash. Neuro:  Strength and sensation are intact. Psych: Normal affect.  Accessory Clinical Findings    ECG personally reviewed by me today - NSR with PVCs, 66bpm, and nonspecific ST/T changes versus baseline wander - no acute changes.  VITALS Reviewed today   Temp Readings from Last 3 Encounters:  09/17/20 (!) 97.3 F (36.3 C) (Temporal)  06/09/20 97.9 F (36.6 C) (Temporal)  04/22/20 (!) 97.2 F (36.2 C)   BP Readings from Last 3 Encounters:  01/16/21 140/80  09/17/20 130/80  07/15/20 120/82   Pulse Readings from Last 3 Encounters:  01/16/21 66  09/17/20 (!) 58  07/15/20 67    Wt Readings from Last 3 Encounters:  01/16/21 171 lb 4 oz (77.7 kg)  09/17/20 172 lb (78 kg)  07/15/20 170 lb (77.1 kg)     LABS  reviewed today   Lab Results  Component Value Date   WBC 6.5 01/18/2020   HGB 13.1 01/18/2020   HCT 39.0 01/18/2020   MCV 96.1 01/18/2020   PLT 305 01/18/2020   Lab Results  Component Value Date   CREATININE 0.72 01/18/2020   BUN 6 (L) 01/18/2020   NA 133 (L) 01/18/2020   K 4.3 01/18/2020   CL 100 01/18/2020   CO2 24 01/18/2020   Lab Results  Component Value Date   ALT 46 10/19/2018   AST 73 (H) 10/19/2018   ALKPHOS 47 10/19/2018   BILITOT 0.6 10/19/2018   Lab Results  Component Value Date   CHOL 169 10/19/2018   HDL 44.30 10/19/2018   LDLCALC 24 10/29/2017   LDLDIRECT 62.0 10/19/2018   TRIG (H) 10/19/2018    492.0 Triglyceride is over 400; calculations on Lipids are invalid.   CHOLHDL 4 10/19/2018    Lab Results  Component  Value Date   HGBA1C 5.8 (H) 04/04/2014   Lab Results  Component Value Date   TSH 2.086  10/28/2017     STUDIES/PROCEDURES reviewed today   Vas [place] LE with/Wo TBI 10/2020 Summary:  Right: Resting right ankle-brachial index is within normal range. No  evidence of significant right lower extremity arterial disease. The right  toe-brachial index is normal.  Left: Resting left ankle-brachial index indicates moderate left lower  extremity arterial disease. The left toe-brachial index is abnormal.   Carotid US  01/2020 Summary:  Right Carotid: Velocities in the right ICA are consistent with a 40-59%                 stenosis.                 Non-hemodynamically significant plaque <50% noted in the  CCA.   Left Carotid: Velocities in the left ICA are consistent with a 40-59%  stenosis.                Non-hemodynamically significant plaque <50% noted in the  CCA. Vertebrals:  Bilateral vertebral arteries demonstrate antegrade flow.  Subclavians: Bilateral subclavian arteries were stenotic. Bilateral  subclavian               artery flow was disturbed.   Vas [place] AAA 10/2019 Summary:  Abdominal Aorta:  There is evidence of abnormal dilatation of the distal Abdominal aorta.  The largest aortic diameter remains essentially unchanged compared to  prior exam.  Previous diameter measurement was 4.2 cm obtained on 11/06/2018.   Echo 10/2017 - Left ventricle: The cavity size was normal. Wall thickness was    increased in a pattern of mild LVH. Systolic function was at the    lower limits of normal. The estimated ejection fraction was 50%.    Doppler parameters are consistent with abnormal left ventricular    relaxation (grade 1 diastolic dysfunction).  - Regional wall motion abnormality: Akinesis of the basal inferior    and basal inferolateral myocardium; severe hypokinesis of the mid    inferior and mid inferolateral myocardium.  - Aortic valve: Trileaflet; mildly calcified leaflets.  There was no    stenosis.  - Right ventricle: Systolic function was mildly reduced.   Cardiac cath  10/25/2017 No stents Known Severe Multi-vessel CAD: known CTO of ostRCA, mLAD & SVG-AM-PDA, SVG-OM. Widely patent LIMA-LAD - retograde fills to 100% CTO, antegrade flow brisk to the Apex. Prox LM ~40% (stable lesion just prior to the stent) followed by widely patent stents running from pLM-ost-proxCx-OM with brisk flow in both the distal OM & AV Groove Cx --> collaterals to RPL system. Mldly reduced LVEF with basal-mid Inferior Akinesis. Normal/Low LVEDP.   LHC 04/05/2014,  catheterization. This showed an occluded OM vessel with DES placed 2 Occluded mid LAD with a patent LIMA to the LAD, patent left circumflex, prior bare-metal stent into the OM1 was occluded in the distal portion just after the ostium of OM1. Occluded RCA, ejection fraction 35%   LHC 12/2011  repeat catheterization performed 12/2011 This showed patent stents to the left circumflex and LAD with patent LIMA to the LAD. Vein graft to the OM and vein graft to the RCA was occluded  Assessment & Plan    HFmrEF --Reports sx of overload as above. Volume up on exam with recommendation for diuresis. Discussed diuretic therapy, declined by pt. Discussed limiting fluids and salt. Agreeable to updating echo to reassess EF and WM and r/o acute structural abnormalities.   CAD --Declines sx of angina. Declines BB and adjustment of Imdur. Continue current medications  AAA --Requests  update on his study, sooner than scheduled. Will move study up per pt request. Previous study showed AAA at 4.2cm. Risk factor modification recommended with LDL, BP, and HR control. Declines medication changes today.  Essential hypertension --Declines changes to antihypertensives today. Continue current medications.  HLD --Recommend LDL below 70. Previous 2020 LDL was 62.0. Continue to monitor lipids per PCP.   Disposition: Follow-up as scheduled  with Dr. Rockey Situ  *Please be aware that the above documentation was completed voice recognition software and may contain dictation errors.      Arvil Chaco, PA-C 01/16/2021

## 2021-01-16 NOTE — Patient Instructions (Addendum)
Medication Instructions:  Your physician recommends that you continue on your current medications as directed. Please refer to the Current Medication list given to you today.  *If you need a refill on your cardiac medications before your next appointment, please call your pharmacy*   Lab Work: None ordered   If you have labs (blood work) drawn today and your tests are completely normal, you will receive your results only by: [facility] (if you have MyChart) OR A paper copy in the mail If you have any lab test that is abnormal or we need to change your treatment, we will call you to review the results.   Testing/Procedures: Your physician has requested that you have an echocardiogram. Echocardiography is a painless test that uses sound waves to create images of your heart. It provides your doctor with information about the size and shape of your heart and how well your heart's chambers and valves are working. This procedure takes approximately one hour. There are no restrictions for this procedure.  Your physician has requested that you have an abdominal aorta duplex. During this test, an ultrasound is used to evaluate the aorta. Allow 30 minutes for this exam. Do not eat after midnight the day before and avoid carbonated beverages   Follow-Up: Follow up after you have your Echocardiogram    Other Instructions   Information About Your Aneurysm  The word "aneurysm" refers to a bulge in an artery (blood vessel). Most people think of them in the context of an emergency, but yours was found incidentally. At this point there is nothing you need to do from a procedure standpoint, but there are some important things to keep in mind for day-to-day life.  Mainstays of therapy for aneurysms include very good blood pressure control, healthy lifestyle, and avoiding tobacco products and street drugs. Research has raised concern that antibiotics in the fluoroquinolone class could be associated  with increased risk of having an aneurysm develop or tear. This includes medicines that end in "floxacin," like Cipro or Levaquin. Make sure to discuss this information with other healthcare providers if you require antibiotics.  Since aneurysms can run in families, you should discuss your diagnosis with first degree relatives as they may need to be screened for this. Regular mild-moderate physical exercise is important, but avoid heavy lifting/weight lifting over 30lbs, chopping wood, shoveling snow or digging heavy earth with a shovel. It is best to avoid activities that cause grunting or straining (medically referred to as a "Valsalva maneuver"). This happens when a person bears down against a closed throat to increase the strength of arm or abdominal muscles. There's often a tendency to do this when lifting heavy weights, doing sit-ups, push-ups or chin-ups, etc., but it may be harmful.  This is a finding I would expect to be monitored periodically by your cardiology team. Most unruptured thoracic aortic aneurysms cause no symptoms, so they are often found during exams for other conditions. Contact a health care provider if you develop any discomfort in your upper back, neck, abdomen, trouble swallowing, cough or hoarseness, or unexplained weight loss. Get help right away if you develop severe pain in your upper back or abdomen that may move into your chest and arms, or any other concerning symptoms such as shortness of breath or fever.   Monitor your blood pressure --We recommend upper arm BP cuffs over that of the wrist. --You can always check your device's accuracy against an office model once a year if possible. You can  do so by bringing your cuff into the office and notifying the person that rooms you that you would like to check your cuff against our office readings. --Measure your BP at the same time each day. Blood pressure varies often throughout the day with higher readings in the morning. BP  may also be lower at home than in the office. Do not measure BP right after you wake up or after exercising. Avoid caffeine, tobacco, and alcohol for 30 minutes before taking a measurement. Sit quietly for five minutes in a comfortable position with your legs and ankles uncrossed and back supported. Feet flat on the ground. Have your arm supported and at the level of your heart. Always use the same arm when taking your blood pressure. Place the cuff over bare skin rather than clothing. Each time you measure, take an additional reading if abnormal to ensure accurate by waiting 1-3 minutes after the first reading. --Please bring a BP log into the office. It is helpful to document the time of each BP reading, as well as any activity or medications taken around the reading. In addition, it is helpful to include heart rate at the time of the BP reading. Daily weights are also encouraged. --Goal BP is 130/80 or lower. If your BP is low but you are not dizzy, this is fine and preferred over BP higher than 130/80. If your BP is less than 100 for the top number and you are dizzy, call the office. If your blood pressure is consistently elevated with top number above 180 and bottom above 120, this can damage the body. If you have severe increase in your blood pressure or concerning symptoms of severe chest pain, headache with confusion and blurred vision, severe abdominal or back pain, shortness of breath, seizures, or loss of consciousness, go to the emergency department.

## 2021-01-19 NOTE — Addendum Note (Signed)
Addended byAlvis Lemmings C on: 01/19/2021 10:26 AM   Modules accepted: Orders

## 2021-01-20 ENCOUNTER — Other Ambulatory Visit: Payer: Self-pay | Admitting: Cardiovascular Disease

## 2021-01-20 DIAGNOSIS — M5117 Intervertebral disc disorders with radiculopathy, lumbosacral region: Secondary | ICD-10-CM | POA: Diagnosis not present

## 2021-01-20 DIAGNOSIS — M4316 Spondylolisthesis, lumbar region: Secondary | ICD-10-CM | POA: Diagnosis not present

## 2021-01-20 DIAGNOSIS — I714 Abdominal aortic aneurysm, without rupture: Secondary | ICD-10-CM | POA: Diagnosis not present

## 2021-01-20 DIAGNOSIS — M545 Low back pain, unspecified: Secondary | ICD-10-CM | POA: Diagnosis not present

## 2021-01-20 DIAGNOSIS — M4807 Spinal stenosis, lumbosacral region: Secondary | ICD-10-CM | POA: Diagnosis not present

## 2021-01-20 DIAGNOSIS — M4317 Spondylolisthesis, lumbosacral region: Secondary | ICD-10-CM | POA: Diagnosis not present

## 2021-02-02 DIAGNOSIS — M5416 Radiculopathy, lumbar region: Secondary | ICD-10-CM | POA: Diagnosis not present

## 2021-02-02 DIAGNOSIS — M545 Low back pain, unspecified: Secondary | ICD-10-CM | POA: Diagnosis not present

## 2021-02-03 ENCOUNTER — Telehealth: Payer: Self-pay | Admitting: Cardiovascular Disease

## 2021-02-03 NOTE — Telephone Encounter (Signed)
   [facility] Pre-operative Risk Assessment    Patient Name: [name]  DOB: Jan 10, 1951 MRN: [id number]  HEARTCARE STAFF:  - IMPORTANT!!!!!! Under Visit Info/Reason for Call, type in Other and utilize the format Clearance MM/DD/YY or Clearance TBD. Do not use dashes or single digits. - Please review there is not already an duplicate clearance open for this procedure. - If request is for dental extraction, please clarify the # of teeth to be extracted. - If the patient is currently at the dentist's office, call Pre-Op Callback Staff (MA/nurse) to input urgent request.  - If the patient is not currently in the dentist office, please route to the Pre-Op pool.  Request for surgical clearance:  What type of surgery is being performed? Lumbar epidural steroid injection   When is this surgery scheduled? TBD  What type of clearance is required (medical clearance vs. Pharmacy clearance to hold med vs. Both)? Both   Are there any medications that need to be held prior to surgery and how long? Plavix - discontinue 7 days prior to procedure and resume 24 hours after    Practice name and name of physician performing surgery? [facility]   What is the office phone number? [phone]   7.   What is the office fax number? [phone]  8.   Anesthesia type (None, local, MAC, general) ? Not listed    Ace Gins 02/03/2021, 3:03 PM  _________________________________________________________________   (provider comments below)

## 2021-02-04 NOTE — Telephone Encounter (Signed)
Patient is a 70 year old male with past medical history of CAD s/p CABG 1995 and stenting in 2004, AAA, hypertension, hyperlipidemia and PAD.  Last cardiac catheterization was performed on 10/28/2017 showed EF 45 to 50%, known CTO of ostial RCA, mid LAD, SVG-AM-PDA, SVG-OM, widely patent LIMA to LAD.  Overall, coronary anatomy appears to be stable from 2015.  Patient was recently seen by Marrianne Mood PA-C for shortness of breath and felt to be volume overloaded.  Diuretic therapy declined by the patient.  Repeat echocardiogram was ordered.  Talking with the patient on the phone today, he denies any anginal symptoms.  Dr. Rockey Situ to review, do we need to wait until echocardiogram completed before clearing the patient for back injection?  And can patient hold Plavix for 7 days prior to the back injection?  Please forward your response to P CV DIV PREOP

## 2021-02-11 NOTE — Telephone Encounter (Signed)
   Primary Cardiologist: Ida Rogue, MD  Chart reviewed as part of pre-operative protocol coverage. Given past medical history and time since last visit, based on ACC/AHA guidelines, [name] would be at acceptable risk for the planned procedure without further cardiovascular testing.   His Plavix may be held for 7 days prior to his injection. Please resume as soon as hemostasis is achieved. His aspirin should be continued throughout his procedure.  I will route this recommendation to the requesting party via Epic fax function and remove from pre-op pool.  Please call with questions.  Jossie Ng. Lorenda Grecco NP-C    02/11/2021, 7:54 AM [address] Office [phone] Fax [phone]

## 2021-02-17 ENCOUNTER — Encounter: Payer: Self-pay | Admitting: Physician Assistant

## 2021-02-17 NOTE — Telephone Encounter (Addendum)
Pre-op clearance already addressed. I moved this requested clearance over to a Letters format so that it can be more easily printed concisely for patient, but note below is requesting for it to be printed out in [place] office and handed to patient. Can you please help? No additional input needed from preop APP team at this time. Thank you!

## 2021-02-17 NOTE — Telephone Encounter (Signed)
I will have to route back to Dr. Rockey Situ for clearance on holding aspirin for 6 days for this lumbar epidural steroid injection. He previously gave clearance to hold Plavix for 7 days as requested but indicated the patient should stay on aspirin. We cannot supercede this recommendation without his input. Dr. Rockey Situ - Please route response to P CV DIV PREOP (the pre-op pool). Thank you.

## 2021-02-17 NOTE — Telephone Encounter (Signed)
Returned the call to the pt.  He states that [facility]'s fax machine has been down 3 weeks and hasn't received his clearance. Pt will stop by Dr. Donivan Scull office in the morning, 02/18/2021 and pick up a copy.  Will route it to Dr. Donivan Scull RN to see if she could print it out for pt to pick up.

## 2021-02-17 NOTE — Telephone Encounter (Signed)
Patient's wife  is calling back to see the status of clearance

## 2021-02-17 NOTE — Telephone Encounter (Signed)
Letter printed and placed at front of office so pt may pick up tomorrow

## 2021-02-17 NOTE — Telephone Encounter (Signed)
Angie @ [facility], [phone] , returned the call regarding not receiving faxes.  She confirmed that their fax machine is down, however, she will take a verbal.  However, she forgot to ask for clearance on this pt's Aspirin and needs to hold X's 6 days.  Will route back to the preop pool to process.  Angie will contact pt to let him know that he don't have to pick up a copy, that she will take it verbally, once we receive.

## 2021-02-20 ENCOUNTER — Other Ambulatory Visit: Payer: Medicare Other

## 2021-02-20 ENCOUNTER — Other Ambulatory Visit: Payer: Self-pay

## 2021-02-20 ENCOUNTER — Encounter: Payer: Self-pay | Admitting: Internal Medicine

## 2021-02-20 ENCOUNTER — Ambulatory Visit (INDEPENDENT_AMBULATORY_CARE_PROVIDER_SITE_OTHER): Payer: Medicare Other | Admitting: Internal Medicine

## 2021-02-20 VITALS — BP 140/86 | HR 61 | Temp 97.4°F | Ht 63.75 in | Wt 169.0 lb

## 2021-02-20 DIAGNOSIS — I7143 Infrarenal abdominal aortic aneurysm, without rupture: Secondary | ICD-10-CM | POA: Diagnosis not present

## 2021-02-20 DIAGNOSIS — J449 Chronic obstructive pulmonary disease, unspecified: Secondary | ICD-10-CM

## 2021-02-20 DIAGNOSIS — I5032 Chronic diastolic (congestive) heart failure: Secondary | ICD-10-CM

## 2021-02-20 DIAGNOSIS — Z0000 Encounter for general adult medical examination without abnormal findings: Secondary | ICD-10-CM | POA: Diagnosis not present

## 2021-02-20 DIAGNOSIS — I701 Atherosclerosis of renal artery: Secondary | ICD-10-CM

## 2021-02-20 DIAGNOSIS — I25119 Atherosclerotic heart disease of native coronary artery with unspecified angina pectoris: Secondary | ICD-10-CM

## 2021-02-20 DIAGNOSIS — I739 Peripheral vascular disease, unspecified: Secondary | ICD-10-CM

## 2021-02-20 DIAGNOSIS — I700 Atherosclerosis of aorta: Secondary | ICD-10-CM

## 2021-02-20 DIAGNOSIS — K2100 Gastro-esophageal reflux disease with esophagitis, without bleeding: Secondary | ICD-10-CM

## 2021-02-20 LAB — CBC
HCT: 41.4 % (ref 39.0–52.0)
Hemoglobin: 13.5 g/dL (ref 13.0–17.0)
MCHC: 32.7 g/dL (ref 30.0–36.0)
MCV: 91.7 fl (ref 78.0–100.0)
Platelets: 327 10*3/uL (ref 150.0–400.0)
RBC: 4.52 Mil/uL (ref 4.22–5.81)
RDW: 16.3 % — ABNORMAL HIGH (ref 11.5–15.5)
WBC: 7.1 10*3/uL (ref 4.0–10.5)

## 2021-02-20 LAB — HEPATIC FUNCTION PANEL
ALT: 20 U/L (ref 0–53)
AST: 29 U/L (ref 0–37)
Albumin: 4 g/dL (ref 3.5–5.2)
Alkaline Phosphatase: 42 U/L (ref 39–117)
Bilirubin, Direct: 0.1 mg/dL (ref 0.0–0.3)
Total Bilirubin: 0.5 mg/dL (ref 0.2–1.2)
Total Protein: 7.2 g/dL (ref 6.0–8.3)

## 2021-02-20 LAB — RENAL FUNCTION PANEL
Albumin: 4 g/dL (ref 3.5–5.2)
BUN: 4 mg/dL — ABNORMAL LOW (ref 6–23)
CO2: 28 mEq/L (ref 19–32)
Calcium: 9.3 mg/dL (ref 8.4–10.5)
Chloride: 102 mEq/L (ref 96–112)
Creatinine, Ser: 0.81 mg/dL (ref 0.40–1.50)
GFR: 89.38 mL/min (ref >60.00)
Glucose, Bld: 102 mg/dL — ABNORMAL HIGH (ref 70–99)
Phosphorus: 2.5 mg/dL (ref 2.3–4.6)
Potassium: 3.9 mEq/L (ref 3.5–5.1)
Sodium: 136 mEq/L (ref 135–145)

## 2021-02-20 LAB — LIPID PANEL
Cholesterol: 138 mg/dL (ref 0–200)
HDL: 52.2 mg/dL (ref >39.00)
NonHDL: 85.88
Total CHOL/HDL Ratio: 3
Triglycerides: 206 mg/dL — ABNORMAL HIGH (ref 0.0–149.0)
VLDL: 41.2 mg/dL — ABNORMAL HIGH (ref 0.0–40.0)

## 2021-02-20 LAB — LDL CHOLESTEROL, DIRECT: Direct LDL: 65 mg/dL

## 2021-02-20 NOTE — Assessment & Plan Note (Signed)
Quiet on omeprazole daily Brief flare a week ago--but better now

## 2021-02-20 NOTE — Assessment & Plan Note (Signed)
Having right leg radicular pain but no claudication

## 2021-02-20 NOTE — Assessment & Plan Note (Signed)
I have personally reviewed the Medicare Annual Wellness questionnaire and have noted 1. The patient's medical and social history 2. Their use of alcohol, tobacco or illicit drugs 3. Their current medications and supplements 4. The patient's functional ability including ADL's, fall risks, home safety risks and hearing or visual             impairment. 5. Diet and physical activities 6. Evidence for depression or mood disorders  The patients weight, height, BMI and visual acuity have been recorded in the chart I have made referrals, counseling and provided education to the patient based review of the above and I have provided the pt with a written personalized care plan for preventive services.  I have provided you with a copy of your personalized plan for preventive services. Please take the time to review along with your updated medication list.  Colon due again next year Discussed PSA again---he prefers to not do it Discussed exercise Flu vaccine today Bivalent COVID vaccine soon Consider shingrix when covered

## 2021-02-20 NOTE — Assessment & Plan Note (Signed)
No chest pain on isosorbide Stable DOE On plavix, zetia, crestor, carvedilol, ASA

## 2021-02-20 NOTE — Assessment & Plan Note (Signed)
Compensated now Weight stable--neutral fluid status

## 2021-02-20 NOTE — Assessment & Plan Note (Signed)
Due to have this checked again soon

## 2021-02-20 NOTE — Progress Notes (Signed)
Subjective:    Patient ID: [name], male    DOB: 18-Dec-1950, 70 y.o.   MRN: [id number]  HPI Here for Medicare wellness visit and follow up of chronic health conditions This visit occurred during the SARS-CoV-2 public health emergency.  Safety protocols were in place, including screening questions prior to the visit, additional usage of staff PPE, and extensive cleaning of exam room while observing appropriate contact time as indicated for disinfecting solutions.   Reviewed advanced directives Reviewed other doctors---Dr Gollan--cardiology, Dr Anna--GI, Dr Rise Mu, [facility]--opto No hospitalizations or surgery in past year Enjoys 3-4 beers daily No tobacco now Stays active on land---no other set exercise Vision is fine Hearing is okay [place] once this year---walking backwards on pile of lumber. No injury No depression or anhedonia Independent with instrumental ADLs Mild memory issues---nothing worrisome  Having trouble with right leg Had evaluation with ortho----they think it is related to his back They are planning a back injection--using tramadol Had vascular evaluation---PAD found on left but no symptoms Did have back MRI---AAA found and will be rechecked Also known aortic atherosclerosis  No heart problems No chest pain  Does get DOE---labored when he presses it (and without exertion at times) No regular cough or wheezing No dizziness or syncope No edema Sleeps on 2 pillows---no PND  Takes omeprazole daily Did have some reflux for 3-4 days last week---better now No dypshagia  Current Outpatient Medications on File Prior to Visit  Medication Sig Dispense Refill   aspirin 81 MG tablet Take 81 mg by mouth daily.     BLACK ELDERBERRY PO Take 1 each by mouth daily.     carvedilol (COREG) 3.125 MG tablet TAKE 1 TABLET TWICE DAILY 180 tablet 3   clopidogrel (PLAVIX) 75 MG tablet TAKE 1 TABLET (75 MG TOTAL) BY MOUTH DAILY. 90 tablet 1   ezetimibe (ZETIA) 10 MG  tablet TAKE 1 TABLET (10 MG TOTAL) BY MOUTH DAILY. 90 tablet 1   isosorbide mononitrate (IMDUR) 30 MG 24 hr tablet TAKE ONE HALF A TABLET  (15 MG TOTAL) BY MOUTH AT BEDTIME. 45 tablet 3   meclizine (ANTIVERT) 25 MG tablet TAKE 1 TABLET BY MOUTH 3 TIMES DAILY AS NEEDED FOR DIZZINESS. 30 tablet 2   mirtazapine (REMERON) 15 MG tablet TAKE 1 TABLET BY MOUTH EVERYDAY AT BEDTIME 90 tablet 3   Multiple Vitamins-Minerals (MULTIVITAMIN WITH MINERALS) tablet Take 1 tablet by mouth daily.     nitroGLYCERIN (NITROSTAT) 0.4 MG SL tablet Place 1 tablet (0.4 mg total) under the tongue every 5 (five) minutes as needed for chest pain. 25 tablet 2   omeprazole (PRILOSEC) 20 MG capsule Take 20 mg by mouth daily.     rosuvastatin (CRESTOR) 40 MG tablet Take 1 tablet (40 mg total) by mouth daily. 90 tablet 3   traMADol (ULTRAM) 50 MG tablet as needed.     tiZANidine (ZANAFLEX) 4 MG tablet tizanidine 4 mg tablet  Take 1 tablet every day by oral route at bedtime. (Patient not taking: Reported on 02/20/2021)     No current facility-administered medications on file prior to visit.    Allergies  Allergen Reactions   Metoprolol Tartrate Hives and Itching   Quinolones Other (See Comments)    Contraindicated with AAA   Metoclopramide Nausea Only    Other reaction(s): Unknown    Past Medical History:  Diagnosis Date   AAA (abdominal aortic aneurysm)    a. Duplex 01/2012: stable infrarenal saccular AAA at 3.3cm x 3.2xm (f/u  recommended 01/2013 per Dr. Rockey Situ)   Alcohol abuse    CAD (coronary artery disease)    a. 1995 s/p CABG x 4: LIMA->LAD, VG->RI (known to be occluded), VG->AM->PDA;  b. 05/2002 Inf STEMI: VG->AM->PDA 100% treated w/ 2 BMS complicated by acute thrombosis req 3 BMS;  c. 07/2003 DES to  LAD & LCX (VG's to PDA & RI 100%);  d. 12/2010 Acute MI ([place]): DES to LCX & LM , LIMA ok,;  e.12/2011 Cath: LM/LCX stents ok , LIMA patent.   Cardiomyopathy ([place])    a. EF 35% by cath 2015.   Diverticulitis    1/06  Diverticulitis--CT of pelvis--diffuse sigmoid divertic   Dyspnea    GERD (gastroesophageal reflux disease)    Hyperlipidemia    Hypertension    Myocardial infarction [facility])    PAD (peripheral artery disease) ([place])    a. external iliac and mesenteric stenosis noted by noninvasive imaging.   Renal artery stenosis ([place])    a. 03/2011 PTA and stenting of L RA. b. last duplex 2016 with stable 1-59% bilateral RAS, incidental >50% R EIA stenosis    Past Surgical History:  Procedure Laterality Date   [place]     8/09  Cath--vein graft occlusions which are old--no acute changes   CARDIAC CATHETERIZATION  11/13/2010   stent x 2 @ [place]   CARDIAC CATHETERIZATION  04/05/2014   stent placement    CARDIAC CATHETERIZATION  10/28/2017   CLOSED REDUCTION SHOULDER DISLOCATION     COLONOSCOPY WITH PROPOFOL N/A 12/22/2018   Procedure: COLONOSCOPY WITH PROPOFOL;  Surgeon: Jonathon Bellows, MD;  Location: [facility] ENDOSCOPY;  Service: Gastroenterology;  Laterality: N/A;   CORONARY ANGIOPLASTY  04/05/2014   stent placement OM 1   CORONARY ARTERY BYPASS GRAFT     CORONARY STENT PLACEMENT  7/12   2 stents--Promus element plus (everolimus eluting)--[facility] [place] CATH AND CORS/GRAFTS ANGIOGRAPHY N/A 10/28/2017   Procedure: LEFT HEART CATH AND CORS/GRAFTS ANGIOGRAPHY;  Surgeon: Leonie Man, MD;  Location: [place] CV LAB;  Service: Cardiovascular;  Laterality: N/A;   LEFT HEART CATHETERIZATION WITH CORONARY/GRAFT ANGIOGRAM N/A 01/04/2012   Procedure: LEFT HEART CATHETERIZATION WITH Beatrix Fetters;  Surgeon: Burnell Blanks, MD;  Location: [facility] CATH LAB;  Service: Cardiovascular;  Laterality: N/A;   LEFT HEART CATHETERIZATION WITH CORONARY/GRAFT ANGIOGRAM N/A 04/05/2014   Procedure: LEFT HEART CATHETERIZATION WITH Beatrix Fetters;  Surgeon: Jettie Booze, MD;  Location: [facility] CATH LAB;  Service: Cardiovascular;   Laterality: N/A;   PERCUTANEOUS CORONARY STENT INTERVENTION (PCI-S)  04/05/2014   Procedure: PERCUTANEOUS CORONARY STENT INTERVENTION (PCI-S);  Surgeon: Jettie Booze, MD;  Location[facility] CATH LAB;  Service: Cardiovascular;;  OM1   RENAL ANGIOGRAM N/A 04/16/2011   Procedure: RENAL ANGIOGRAM;  Surgeon: Burnell Blanks, MD;  Location: [facility] CATH LAB;  Service: Cardiovascular;  Laterality: N/A;   RENAL ARTERY STENT  03/2011 ?    Family History  Problem Relation Age of Onset   Coronary artery disease Mother        Died MI age 50   Hypertension Mother    Heart attack Mother    Coronary artery disease Sister        Living   Coronary artery disease Brother        Living   Coronary artery disease Father        Died MI age 22   Heart attack Father    Heart  attack Maternal Grandfather    Diabetes Neg Hx    Cancer Neg Hx        prostate or colon    Social History   Socioeconomic History   Marital status: Married    Spouse name: Not on file   Number of children: 3   Years of education: Not on file   Highest education level: Not on file  Occupational History   Occupation: Geneticist, molecular (Animal nutritionist)    Comment: Retired  Tobacco Use   Smoking status: Former    Packs/day: 3.00    Years: 25.00    Pack years: 75.00    Types: Cigarettes    Quit date: 05/28/1993    Years since quitting: 27.7   Smokeless tobacco: Never  Vaping Use   Vaping Use: Never used  Substance and Sexual Activity   Alcohol use: Yes    Alcohol/week: 42.0 standard drinks    Types: 42 Cans of beer per week   Drug use: No   Sexual activity: Not on file  Other Topics Concern   Not on file  Social History Narrative   No living will   No health care POA but requests wife--then children   Would accept resuscitation   Not sure about tube feeds   Social Determinants of Health   Financial Resource Strain: Not on file  Food Insecurity: Not on file  Transportation Needs: Not on file   Physical Activity: Not on file  Stress: Not on file  Social Connections: Not on file  Intimate Partner Violence: Not on file   Review of Systems Sleeps okay other than leg pain Appetite is good---better lately Weight is stable Wears seat belt No teeth--doesn't wear dentures No suspicious skin lesions Bowels move fine---no blood Voids with good stream. Nocturia x 2 usually Has some locking in left hand fingers at times---temporary pain    Objective:   Physical Exam Constitutional:      Appearance: Normal appearance.  HENT:     Mouth/Throat:     Comments: No lesions Eyes:     Conjunctiva/sclera: Conjunctivae normal.     Pupils: Pupils are equal, round, and reactive to light.  Cardiovascular:     Rate and Rhythm: Normal rate and regular rhythm.     Heart sounds: No murmur heard.   No gallop.     Comments: No palpable pulses in feet Pulmonary:     Effort: Pulmonary effort is normal.     Breath sounds: No wheezing or rales.     Comments: Slightly decreased breath sounds but clear Abdominal:     Palpations: Abdomen is soft.     Tenderness: There is no abdominal tenderness.  Musculoskeletal:     Cervical back: Neck supple.     Right lower leg: No edema.     Left lower leg: No edema.  Lymphadenopathy:     Cervical: No cervical adenopathy.  Skin:    Findings: No lesion or rash.  Neurological:     Mental Status: He is alert and oriented to person, place, and time.     Comments: President---"Joe Balinda Quails Obama" [phone] [id number] Recall 3/3  Psychiatric:        Mood and Affect: Mood normal.        Behavior: Behavior normal.           Assessment & Plan:

## 2021-02-20 NOTE — Assessment & Plan Note (Signed)
Stable respiratory status--quit smoking years ago No Rx indicated

## 2021-02-20 NOTE — Assessment & Plan Note (Signed)
On imaging Is on statin 

## 2021-02-23 ENCOUNTER — Ambulatory Visit: Payer: Medicare Other | Admitting: Cardiovascular Disease

## 2021-02-23 DIAGNOSIS — Z23 Encounter for immunization: Secondary | ICD-10-CM | POA: Diagnosis not present

## 2021-02-23 NOTE — Telephone Encounter (Addendum)
    Patient Name: [name]  DOB: 12-17-1950 MRN: [id number]  Primary Cardiologist: Ida Rogue, MD  Chart reviewed as part of pre-operative protocol coverage.  As documented below by Coletta Memos, NP and Melina Copa, PA-C and based on ACC/AHA guidelines, [name] would be at acceptable risk for the planned procedure without further cardiovascular testing.   The patient was advised that if he develops new symptoms prior to surgery to contact our office to arrange for a follow-up visit, and he verbalized understanding.  He has been previously cleared to hold Plavix 5-7 days prior to his upcoming procedure with plans to restart as soon as he is cleared to do so by a Psychologist, sport and exercise.  Dr. Rockey Situ has asked that patient remain on aspirin throughout the perioperative period given his history of CAD s/p CABG in 1995 with subsequent stenting in 2004.  Please do not interrupt this therapy.  It appears that the requesting office is having trouble with their fax machine.  I called Angie with [facility] who is listed below to relay the above recommendations.  Left a voicemail to call back if any questions or concerns.   Abigail Butts, PA-C 02/23/2021, 11:38 AM

## 2021-02-26 ENCOUNTER — Ambulatory Visit (INDEPENDENT_AMBULATORY_CARE_PROVIDER_SITE_OTHER): Payer: Medicare Other

## 2021-02-26 ENCOUNTER — Other Ambulatory Visit: Payer: Self-pay

## 2021-02-26 DIAGNOSIS — M5116 Intervertebral disc disorders with radiculopathy, lumbar region: Secondary | ICD-10-CM | POA: Diagnosis not present

## 2021-02-26 DIAGNOSIS — I7143 Infrarenal abdominal aortic aneurysm, without rupture: Secondary | ICD-10-CM | POA: Diagnosis not present

## 2021-02-26 DIAGNOSIS — I714 Abdominal aortic aneurysm, without rupture, unspecified: Secondary | ICD-10-CM

## 2021-02-26 DIAGNOSIS — R0600 Dyspnea, unspecified: Secondary | ICD-10-CM | POA: Diagnosis not present

## 2021-02-26 LAB — ECHOCARDIOGRAM COMPLETE
AR max vel: 2.91 cm2
AV Area VTI: 2.71 cm2
AV Area mean vel: 2.42 cm2
AV Mean grad: 3 mmHg
AV Peak grad: 5.8 mmHg
AV Vena cont: 0.1 cm
Ao pk vel: 1.2 m/s
Area-P 1/2: 1.36 cm2
Calc EF: 58.6 %
S' Lateral: 2.9 cm
Single Plane A2C EF: 61.1 %
Single Plane A4C EF: 56.2 %

## 2021-02-26 MED ORDER — PERFLUTREN LIPID MICROSPHERE
1.0000 mL | INTRAVENOUS | Status: AC | PRN
  Administered 2021-02-26: 2 mL via INTRAVENOUS

## 2021-03-04 ENCOUNTER — Telehealth: Payer: Self-pay

## 2021-03-04 DIAGNOSIS — I7143 Infrarenal abdominal aortic aneurysm, without rupture: Secondary | ICD-10-CM

## 2021-03-04 NOTE — Telephone Encounter (Signed)
-----   Message from Theora Gianotti, NP sent at 03/03/2021  5:38 PM EST ----- Abdominal aortic aneurysm measuring 4.6 cm, which increased from 4.2 cm from July 2021 study.  Plaque buildup without evidence of severe blockage noted throughout his aorta and bilateral iliac arteries.  Follow-up abdominal aortic aneurysm duplex in 1 year (please place under Dr. Rockey Situ).

## 2021-03-04 NOTE — Telephone Encounter (Signed)
Called patient and spoke with both he and his wife. Informed them of the result note below and ordered the abdominal aortic aneurysm duplex in 1 year.

## 2021-03-08 NOTE — Progress Notes (Signed)
Date:  03/09/2021   ID:  [name], DOB [birth year], MRN [id number]  Patient Location:  [address] [place] [id number]   Provider location:   [facility], [place] office  PCP:  Venia Carbon, MD  Cardiologist:  Arvid Right [facility]   Chief Complaint  Patient presents with   Follow-up    Discuss Echo results. Medications reviewed by the patient verbally. Patient c/o shortness of breath.     History of Present Illness:    [name] is a 70 y.o. male  past medical history of coronary artery disease, bypass surgery in 1995,  stenting at [facility] in February 2004 for MI with a 3.0 x 25 mm and 4.5 x 16 mm Monorail ( location uncertain), also 4.5 x 18 mm stent placed to the mid RCA, followup with repeat stenting in April 2005 to the proximal LAD with a Taxus stent 2.5 x 8 mm, and stenting to the proximal left circumflex with a 2.5 x 12 mm Taxus, with  stent placed November 13, 2010 with a 4.0 x 9 mm stent placed to the left main. renal artery stent.  smoker though stopped in 1995. Prior to that he smoked 3 packs per day. Smoked for approximately 25-28 years 4.2 cm AAA on CT scan 10/2019 He presents for routine followup of his coronary artery disease.  Last seen in clinic March 2022 Seen by one of our other providers September 2022  [facility] Having back and leg discomfort  BP elevated today in office Better at home, 135 at home Reports that BP too low on imdur 30, only taking 15 mg in the evening  Some SOB with exertion Sedentary secondary to leg pain Long smoking hx, 3 ppd, quit 1995 Weight up  Echo reviewed  2. Left ventricular ejection fraction, by estimation, is 50 to 55%. The  left ventricle has low normal function. The left ventricle demonstrates  regional wall motion abnormalities (Basal to mid inferior and basal  inferolateral wall hypokinesis). Left   ventricular diastolic parameters are consistent with Grade I diastolic  dysfunction (impaired relaxation).   3. Right ventricular systolic function is normal. The right ventricular  size is normal.   Carotid ultrasound October 2021 with 40 to 59% disease/stenosis bilaterally,  EKG personally reviewed by myself on todays visit NSR rate 60 bpm, no ST or T wave changes, old inferior MI  Other past medical history reviewed Aorta u/s 10/2018 Stable size, 4.2 cm  Unchanged, discussed  Cardiac cath 10/25/2017 No stents Known Severe Multi-vessel CAD: known CTO of ostRCA, mLAD & SVG-AM-PDA, SVG-OM. Widely patent LIMA-LAD - retograde fills to 100% CTO, antegrade flow brisk to the Apex. Prox LM ~40% (stable lesion just prior to the stent) followed by widely patent stents running from pLM-ost-proxCx-OM with brisk flow in both the distal OM & AV Groove Cx --> collaterals to RPL system. Mldly reduced LVEF with basal-mid Inferior Akinesis. Normal/Low LVEDP.    04/05/2014,  catheterization. This showed an occluded OM vessel with DES placed 2 Occluded mid LAD with a patent LIMA to the LAD, patent left circumflex, prior bare-metal stent into the OM1 was occluded in the distal portion just after the ostium of OM1. Occluded RCA, ejection fraction 35%    repeat catheterization performed 12/2011 This showed patent stents to the left circumflex and LAD with patent LIMA to the LAD. Vein graft to the  OM and vein graft to the RCA was occluded   Past Medical History:  Diagnosis Date   AAA (abdominal aortic aneurysm)    a. Duplex 01/2012: stable infrarenal saccular AAA at 3.3cm x 3.2xm (f/u recommended 01/2013 per Dr. Rockey Situ)   Alcohol abuse    CAD (coronary artery disease)    a. 1995 s/p CABG x 4: LIMA->LAD, VG->RI (known to be occluded), VG->AM->PDA;  b. 05/2002 Inf STEMI: VG->AM->PDA 100% treated w/ 2 BMS complicated by acute thrombosis req 3 BMS;  c. 07/2003 DES to  LAD & LCX (VG's to PDA & RI 100%);  d.  12/2010 Acute MI ([place]): DES to LCX & LM , LIMA ok,;  e.12/2011 Cath: LM/LCX stents ok , LIMA patent.   Cardiomyopathy ([place])    a. EF 35% by cath 2015.   Diverticulitis    1/06 Diverticulitis--CT of pelvis--diffuse sigmoid divertic   Dyspnea    GERD (gastroesophageal reflux disease)    Hyperlipidemia    Hypertension    Myocardial infarction [facility])    PAD (peripheral artery disease) ([place])    a. external iliac and mesenteric stenosis noted by noninvasive imaging.   Renal artery stenosis ([place])    a. 03/2011 PTA and stenting of L RA. b. last duplex 2016 with stable 1-59% bilateral RAS, incidental >50% R EIA stenosis   Past Surgical History:  Procedure Laterality Date   [place]     8/09  Cath--vein graft occlusions which are old--no acute changes   CARDIAC CATHETERIZATION  11/13/2010   stent x 2 @ [place]   CARDIAC CATHETERIZATION  04/05/2014   stent placement    CARDIAC CATHETERIZATION  10/28/2017   CLOSED REDUCTION SHOULDER DISLOCATION     COLONOSCOPY WITH PROPOFOL N/A 12/22/2018   Procedure: COLONOSCOPY WITH PROPOFOL;  Surgeon: Jonathon Bellows, MD;  Location: [facility] ENDOSCOPY;  Service: Gastroenterology;  Laterality: N/A;   CORONARY ANGIOPLASTY  04/05/2014   stent placement OM 1   CORONARY ARTERY BYPASS GRAFT     CORONARY STENT PLACEMENT  7/12   2 stents--Promus element plus (everolimus eluting)--[facility] in [place] CATH AND CORS/GRAFTS ANGIOGRAPHY N/A 10/28/2017   Procedure: LEFT HEART CATH AND CORS/GRAFTS ANGIOGRAPHY;  Surgeon: Leonie Man, MD;  Location: [facility] CV LAB;  Service: Cardiovascular;  Laterality: N/A;   LEFT HEART CATHETERIZATION WITH CORONARY/GRAFT ANGIOGRAM N/A 01/04/2012   Procedure: LEFT HEART CATHETERIZATION WITH Beatrix Fetters;  Surgeon: Burnell Blanks, MD;  Location: [facility] CATH LAB;  Service: Cardiovascular;  Laterality: N/A;   LEFT HEART CATHETERIZATION WITH CORONARY/GRAFT ANGIOGRAM N/A  04/05/2014   Procedure: LEFT HEART CATHETERIZATION WITH Beatrix Fetters;  Surgeon: Jettie Booze, MD;  Location: [facility] CATH LAB;  Service: Cardiovascular;  Laterality: N/A;   PERCUTANEOUS CORONARY STENT INTERVENTION (PCI-S)  04/05/2014   Procedure: PERCUTANEOUS CORONARY STENT INTERVENTION (PCI-S);  Surgeon: Jettie Booze, MD;  Location: [facility] CATH LAB;  Service: Cardiovascular;;  OM1   RENAL ANGIOGRAM N/A 04/16/2011   Procedure: RENAL ANGIOGRAM;  Surgeon: Burnell Blanks, MD;  Location: [facility] CATH LAB;  Service: Cardiovascular;  Laterality: N/A;   RENAL ARTERY STENT  03/2011 ?     Allergies:   Metoprolol tartrate, Quinolones, and Metoclopramide   Social History   Tobacco Use   Smoking status: Former    Packs/day: 3.00    Years: 25.00    Pack years: 75.00    Types: Cigarettes    Quit date: 05/28/1993    Years  since quitting: 27.8   Smokeless tobacco: Never  Vaping Use   Vaping Use: Never used  Substance Use Topics   Alcohol use: Yes    Alcohol/week: 42.0 standard drinks    Types: 42 Cans of beer per week   Drug use: No     Current Outpatient Medications on File Prior to Visit  Medication Sig Dispense Refill   aspirin 81 MG tablet Take 81 mg by mouth daily.     BLACK ELDERBERRY PO Take 1 each by mouth daily.     carvedilol (COREG) 3.125 MG tablet TAKE 1 TABLET TWICE DAILY 180 tablet 3   clopidogrel (PLAVIX) 75 MG tablet TAKE 1 TABLET (75 MG TOTAL) BY MOUTH DAILY. 90 tablet 1   ezetimibe (ZETIA) 10 MG tablet TAKE 1 TABLET (10 MG TOTAL) BY MOUTH DAILY. 90 tablet 1   meclizine (ANTIVERT) 25 MG tablet TAKE 1 TABLET BY MOUTH 3 TIMES DAILY AS NEEDED FOR DIZZINESS. 30 tablet 2   mirtazapine (REMERON) 15 MG tablet TAKE 1 TABLET BY MOUTH EVERYDAY AT BEDTIME 90 tablet 3   Multiple Vitamins-Minerals (MULTIVITAMIN WITH MINERALS) tablet Take 1 tablet by mouth daily.     nitroGLYCERIN (NITROSTAT) 0.4 MG SL tablet Place 1 tablet (0.4 mg total) under the tongue every 5 (five)  minutes as needed for chest pain. 25 tablet 2   omeprazole (PRILOSEC) 20 MG capsule Take 20 mg by mouth daily.     rosuvastatin (CRESTOR) 40 MG tablet Take 1 tablet (40 mg total) by mouth daily. 90 tablet 3   tiZANidine (ZANAFLEX) 4 MG tablet      traMADol (ULTRAM) 50 MG tablet as needed. (Patient not taking: Reported on 03/09/2021)     No current facility-administered medications on file prior to visit.     Family Hx: The patient's family history includes Coronary artery disease in his brother, father, mother, and sister; Heart attack in his father, maternal grandfather, and mother; Hypertension in his mother. There is no history of Diabetes or Cancer.  ROS:   Please see the history of present illness.    Review of Systems  Constitutional: Negative.   HENT: Negative.    Respiratory: Negative.    Cardiovascular: Negative.   Gastrointestinal: Negative.   Musculoskeletal: Negative.   Neurological: Negative.   Psychiatric/Behavioral: Negative.    All other systems reviewed and are negative.   Labs/Other Tests and Data Reviewed:    Recent Labs: 02/20/2021: ALT 20; BUN 4; Creatinine, Ser 0.81; Hemoglobin 13.5; Platelets 327.0; Potassium 3.9; Sodium 136   Recent Lipid Panel Lab Results  Component Value Date/Time   CHOL 138 02/20/2021 10:27 AM   CHOL 163 08/11/2015 09:41 AM   TRIG 206.0 (H) 02/20/2021 10:27 AM   HDL 52.20 02/20/2021 10:27 AM   HDL 46 08/11/2015 09:41 AM   CHOLHDL 3 02/20/2021 10:27 AM   LDLCALC 24 10/29/2017 02:33 AM   LDLCALC 63 01/07/2017 11:20 AM   LDLDIRECT 65.0 02/20/2021 10:27 AM    Wt Readings from Last 3 Encounters:  03/09/21 169 lb 4 oz (76.8 kg)  02/20/21 169 lb (76.7 kg)  01/16/21 171 lb 4 oz (77.7 kg)     Exam:    Vital Signs: Vital signs may also be detailed in the HPI BP (!) 150/90 (BP Location: Left Arm, Patient Position: Sitting, Cuff Size: Normal)   Pulse 60   Ht 5\' 4"  (1.626 m)   Wt 169 lb 4 oz (76.8 kg)   SpO2 97%   BMI 29.05  kg/m   Constitutional:  oriented to person, place, and time. No distress.  HENT:  Head: Grossly normal Eyes:  no discharge. No scleral icterus.  Neck: No JVD, no carotid bruits  Cardiovascular: Regular rate and rhythm, no murmurs appreciated Pulmonary/Chest: Clear to auscultation bilaterally, no wheezes or rails Abdominal: Soft.  no distension.  no tenderness.  Musculoskeletal: Normal range of motion Neurological:  normal muscle tone. Coordination normal. No atrophy Skin: Skin warm and dry Psychiatric: normal affect, pleasant  ASSESSMENT & PLAN:    Problem List Items Addressed This Visit       Cardiology Problems   AAA (abdominal aortic aneurysm) without rupture   Relevant Medications   isosorbide mononitrate (IMDUR) 30 MG 24 hr tablet   Other Relevant Orders   EKG 12-Lead   Ambulatory referral to Vascular Surgery   Heart failure, diastolic, chronic (HCC)   Relevant Medications   isosorbide mononitrate (IMDUR) 30 MG 24 hr tablet   Renal artery stenosis (HCC)   Relevant Medications   isosorbide mononitrate (IMDUR) 30 MG 24 hr tablet   Other Relevant Orders   EKG 12-Lead   Essential hypertension   Relevant Medications   isosorbide mononitrate (IMDUR) 30 MG 24 hr tablet   Hyperlipidemia LDL goal <70   Relevant Medications   isosorbide mononitrate (IMDUR) 30 MG 24 hr tablet   CAD (coronary artery disease) of artery bypass graft   Relevant Medications   isosorbide mononitrate (IMDUR) 30 MG 24 hr tablet   Other Visit Diagnoses     PAD (peripheral artery disease) (HCC)    -  Primary   Relevant Medications   isosorbide mononitrate (IMDUR) 30 MG 24 hr tablet   Other Relevant Orders   EKG 12-Lead   Dyspnea, unspecified type       Enlarging abdominal aortic aneurysm (AAA)       Relevant Medications   isosorbide mononitrate (IMDUR) 30 MG 24 hr tablet   Other Relevant Orders   Ambulatory referral to Vascular Surgery     CAD Currently with no symptoms of angina. No  further workup at this time. Continue current medication regimen.  PAD: Abdominal aortic aneurysm measuring 4.6 cm, which increased from 4.2 cm from July 2021 study.  We will have him establish care with vascular for close monitoring.  May need repeat ultrasound in 6 months No recent lipid panel Renal artery stenosis: prior stent on left 40 to 59% disease/stenosis bilaterally,  ETOH Cessation recommended  Essential hypertension  lisinopril, on carvedilol and low-dose Imdur 15 daily SBP 135 at home, elevated on today's visit Encouraged weight loss Increase imdur to 1/2 pill BID  Hyperlipidemia Cholesterol is at goal on the current lipid regimen. No changes to the medications were made.  SOB with exertion Sedentary secondary to leg pain Long smoking hx, 3 ppd, quit 1995 Weight up  Back pain Cortisone scheduled, holding Plavix 7 days   Total encounter time more than 25 minutes  Greater than 50% was spent in counseling and coordination of care with the patient   Signed, Ida Rogue, [place] [id number], [place], [place] [address]

## 2021-03-09 ENCOUNTER — Other Ambulatory Visit: Payer: Self-pay

## 2021-03-09 ENCOUNTER — Encounter: Payer: Self-pay | Admitting: Cardiovascular Disease

## 2021-03-09 ENCOUNTER — Ambulatory Visit (INDEPENDENT_AMBULATORY_CARE_PROVIDER_SITE_OTHER): Payer: Medicare Other | Admitting: Cardiovascular Disease

## 2021-03-09 VITALS — BP 150/90 | HR 60 | Ht 64.0 in | Wt 169.2 lb

## 2021-03-09 DIAGNOSIS — I5032 Chronic diastolic (congestive) heart failure: Secondary | ICD-10-CM | POA: Diagnosis not present

## 2021-03-09 DIAGNOSIS — I10 Essential (primary) hypertension: Secondary | ICD-10-CM

## 2021-03-09 DIAGNOSIS — I714 Abdominal aortic aneurysm, without rupture, unspecified: Secondary | ICD-10-CM

## 2021-03-09 DIAGNOSIS — I25708 Atherosclerosis of coronary artery bypass graft(s), unspecified, with other forms of angina pectoris: Secondary | ICD-10-CM | POA: Diagnosis not present

## 2021-03-09 DIAGNOSIS — I739 Peripheral vascular disease, unspecified: Secondary | ICD-10-CM | POA: Diagnosis not present

## 2021-03-09 DIAGNOSIS — I7143 Infrarenal abdominal aortic aneurysm, without rupture: Secondary | ICD-10-CM

## 2021-03-09 DIAGNOSIS — R0600 Dyspnea, unspecified: Secondary | ICD-10-CM | POA: Diagnosis not present

## 2021-03-09 DIAGNOSIS — E785 Hyperlipidemia, unspecified: Secondary | ICD-10-CM

## 2021-03-09 DIAGNOSIS — I701 Atherosclerosis of renal artery: Secondary | ICD-10-CM

## 2021-03-09 MED ORDER — ISOSORBIDE MONONITRATE ER 30 MG PO TB24: ORAL_TABLET | ORAL | 3 refills | Status: DC

## 2021-03-09 NOTE — Patient Instructions (Addendum)
Referral to Dr. Lucky Cowboy with vein and vascular They will reach out for your first consul appt.   Medication Instructions:  Try whole imdur/isosorbide daily 30 mg daily  If you need a refill on your cardiac medications before your next appointment, please call your pharmacy.   Lab work: No new labs needed  Testing/Procedures: No new testing needed  Follow-Up: At [facility], you and your health needs are our priority.  As part of our continuing mission to provide you with exceptional heart care, we have created designated Provider Care Teams.  These Care Teams include your primary Cardiologist (physician) and Advanced Practice Providers (APPs -  Physician Assistants and Nurse Practitioners) who all work together to provide you with the care you need, when you need it.  You will need a follow up appointment in 12 months  Providers on your designated Care Team:   Murray Hodgkins, NP Christell Faith, PA-C Cadence Kathlen Mody, [place]  COVID-19 Vaccine Information can be found at: [email or web address] For questions related to vaccine distribution or appointments, please email [email or web address] or call [phone].

## 2021-03-11 DIAGNOSIS — M5416 Radiculopathy, lumbar region: Secondary | ICD-10-CM | POA: Diagnosis not present

## 2021-03-17 ENCOUNTER — Other Ambulatory Visit: Payer: Self-pay

## 2021-03-17 ENCOUNTER — Ambulatory Visit (INDEPENDENT_AMBULATORY_CARE_PROVIDER_SITE_OTHER): Payer: Medicare Other | Admitting: Vascular Surgery

## 2021-03-17 VITALS — BP 168/83 | HR 61 | Ht 64.0 in | Wt 169.0 lb

## 2021-03-17 DIAGNOSIS — E785 Hyperlipidemia, unspecified: Secondary | ICD-10-CM

## 2021-03-17 DIAGNOSIS — I10 Essential (primary) hypertension: Secondary | ICD-10-CM | POA: Diagnosis not present

## 2021-03-17 DIAGNOSIS — I7143 Infrarenal abdominal aortic aneurysm, without rupture: Secondary | ICD-10-CM

## 2021-03-17 DIAGNOSIS — I701 Atherosclerosis of renal artery: Secondary | ICD-10-CM | POA: Diagnosis not present

## 2021-03-17 NOTE — Progress Notes (Signed)
Patient ID: [name], male   DOB: Jul 05, 1950, 70 y.o.   MRN: [id number]  Chief Complaint  Patient presents with   New Patient (Initial Visit)    Consult specialty services    HPI [name] is a 70 y.o. male.  I am asked to see the patient by Dr. Rockey Situ for evaluation of AAA.  The patient is known has had an aneurysm for roughly 10 years now.  He believes this was about 3-1/2 cm or so at his first diagnosis.  He does not have any current aneurysm related symptoms. Specifically, the patient denies new back or abdominal pain, or signs of peripheral embolization.  The patient has chronic back pain from a traumatic injury almost a decade ago but this is not really changed.  Last year, his aneurysm was about 4.2 cm in maximal diameter.  He had an ultrasound earlier this month at his cardiologist office which I reviewed.  This demonstrates an approximately 4.6 cm abdominal aortic aneurysm at this point.  With its continued growth, he is referred for further evaluation and treatment.     Past Medical History:  Diagnosis Date   AAA (abdominal aortic aneurysm)    a. Duplex 01/2012: stable infrarenal saccular AAA at 3.3cm x 3.2xm (f/u recommended 01/2013 per Dr. Rockey Situ)   Alcohol abuse    CAD (coronary artery disease)    a. 1995 s/p CABG x 4: LIMA->LAD, VG->RI (known to be occluded), VG->AM->PDA;  b. 05/2002 Inf STEMI: VG->AM->PDA 100% treated w/ 2 BMS complicated by acute thrombosis req 3 BMS;  c. 07/2003 DES to  LAD & LCX (VG's to PDA & RI 100%);  d. 12/2010 Acute MI ([place]): DES to LCX & LM , LIMA ok,;  e.12/2011 Cath: LM/LCX stents ok , LIMA patent.   Cardiomyopathy ([place])    a. EF 35% by cath 2015.   Diverticulitis    1/06 Diverticulitis--CT of pelvis--diffuse sigmoid divertic   Dyspnea    GERD (gastroesophageal reflux disease)    Hyperlipidemia    Hypertension    Myocardial infarction [facility])    PAD (peripheral artery disease) ([place])    a. external iliac and mesenteric stenosis noted  by noninvasive imaging.   Renal artery stenosis ([place])    a. 03/2011 PTA and stenting of L RA. b. last duplex 2016 with stable 1-59% bilateral RAS, incidental >50% R EIA stenosis    Past Surgical History:  Procedure Laterality Date   [place]     8/09  Cath--vein graft occlusions which are old--no acute changes   CARDIAC CATHETERIZATION  11/13/2010   stent x 2 @ [place]   CARDIAC CATHETERIZATION  04/05/2014   stent placement    CARDIAC CATHETERIZATION  10/28/2017   CLOSED REDUCTION SHOULDER DISLOCATION     COLONOSCOPY WITH PROPOFOL N/A 12/22/2018   Procedure: COLONOSCOPY WITH PROPOFOL;  Surgeon: Jonathon Bellows, MD;  Location: [facility] ENDOSCOPY;  Service: Gastroenterology;  Laterality: N/A;   CORONARY ANGIOPLASTY  04/05/2014   stent placement OM 1   CORONARY ARTERY BYPASS GRAFT     CORONARY STENT PLACEMENT  7/12   2 stents--Promus element plus (everolimus eluting)--[facility] in [place] CATH AND CORS/GRAFTS ANGIOGRAPHY N/A 10/28/2017   Procedure: LEFT HEART CATH AND CORS/GRAFTS ANGIOGRAPHY;  Surgeon: Leonie Man, MD;  Location: [facility] CV LAB;  Service: Cardiovascular;  Laterality: N/A;   LEFT HEART CATHETERIZATION WITH CORONARY/GRAFT ANGIOGRAM N/A 01/04/2012   Procedure: LEFT  HEART CATHETERIZATION WITH Beatrix Fetters;  Surgeon: Burnell Blanks, MD;  Location: [facility] CATH LAB;  Service: Cardiovascular;  Laterality: N/A;   LEFT HEART CATHETERIZATION WITH CORONARY/GRAFT ANGIOGRAM N/A 04/05/2014   Procedure: LEFT HEART CATHETERIZATION WITH Beatrix Fetters;  Surgeon: Jettie Booze, MD;  Location: [facility] CATH LAB;  Service: Cardiovascular;  Laterality: N/A;   PERCUTANEOUS CORONARY STENT INTERVENTION (PCI-S)  04/05/2014   Procedure: PERCUTANEOUS CORONARY STENT INTERVENTION (PCI-S);  Surgeon: Jettie Booze, MD;  Location: [facility] CATH LAB;  Service: Cardiovascular;;  OM1   RENAL ANGIOGRAM N/A 04/16/2011    Procedure: RENAL ANGIOGRAM;  Surgeon: Burnell Blanks, MD;  Location: [facility] CATH LAB;  Service: Cardiovascular;  Laterality: N/A;   RENAL ARTERY STENT  03/2011 ?     Family History  Problem Relation Age of Onset   Coronary artery disease Mother        Died MI age 62   Hypertension Mother    Heart attack Mother    Coronary artery disease Sister        Living   Coronary artery disease Brother        Living   Coronary artery disease Father        Died MI age 12   Heart attack Father    Heart attack Maternal Grandfather    Diabetes Neg Hx    Cancer Neg Hx        prostate or colon      Social History   Tobacco Use   Smoking status: Former    Packs/day: 3.00    Years: 25.00    Pack years: 75.00    Types: Cigarettes    Quit date: 05/28/1993    Years since quitting: 27.8   Smokeless tobacco: Never  Vaping Use   Vaping Use: Never used  Substance Use Topics   Alcohol use: Yes    Alcohol/week: 42.0 standard drinks    Types: 42 Cans of beer per week   Drug use: No     Allergies  Allergen Reactions   Metoprolol Tartrate Hives and Itching   Quinolones Other (See Comments)    Contraindicated with AAA   Metoclopramide Nausea Only    Other reaction(s): Unknown    Current Outpatient Medications  Medication Sig Dispense Refill   aspirin 81 MG tablet Take 81 mg by mouth daily.     BLACK ELDERBERRY PO Take 1 each by mouth daily.     carvedilol (COREG) 3.125 MG tablet TAKE 1 TABLET TWICE DAILY 180 tablet 3   clopidogrel (PLAVIX) 75 MG tablet TAKE 1 TABLET (75 MG TOTAL) BY MOUTH DAILY. 90 tablet 1   ezetimibe (ZETIA) 10 MG tablet TAKE 1 TABLET (10 MG TOTAL) BY MOUTH DAILY. 90 tablet 1   isosorbide mononitrate (IMDUR) 30 MG 24 hr tablet TAKE ONE a day 90 tablet 3   meclizine (ANTIVERT) 25 MG tablet TAKE 1 TABLET BY MOUTH 3 TIMES DAILY AS NEEDED FOR DIZZINESS. 30 tablet 2   mirtazapine (REMERON) 15 MG tablet TAKE 1 TABLET BY MOUTH EVERYDAY AT BEDTIME 90 tablet 3   Multiple  Vitamins-Minerals (MULTIVITAMIN WITH MINERALS) tablet Take 1 tablet by mouth daily.     nitroGLYCERIN (NITROSTAT) 0.4 MG SL tablet Place 1 tablet (0.4 mg total) under the tongue every 5 (five) minutes as needed for chest pain. 25 tablet 2   omeprazole (PRILOSEC) 20 MG capsule Take 20 mg by mouth daily.     rosuvastatin (CRESTOR) 40 MG tablet Take 1 tablet (40  mg total) by mouth daily. 90 tablet 3   tiZANidine (ZANAFLEX) 4 MG tablet      traMADol (ULTRAM) 50 MG tablet as needed.     No current facility-administered medications for this visit.      REVIEW OF SYSTEMS (Negative unless checked)  Constitutional: [] Weight loss  [] Fever  [] Chills Cardiac: [] Chest pain   [] Chest pressure   [x] Palpitations   [] Shortness of breath when laying flat   [] Shortness of breath at rest   [x] Shortness of breath with exertion. Vascular:  [] Pain in legs with walking   [] Pain in legs at rest   [] Pain in legs when laying flat   [] Claudication   [] Pain in feet when walking  [] Pain in feet at rest  [] Pain in feet when laying flat   [] History of DVT   [] Phlebitis   [] Swelling in legs   [] Varicose veins   [] Non-healing ulcers Pulmonary:   [] Uses home oxygen   [] Productive cough   [] Hemoptysis   [] Wheeze  [x] COPD   [] Asthma Neurologic:  [] Dizziness  [] Blackouts   [] Seizures   [] History of stroke   [] History of TIA  [] Aphasia   [] Temporary blindness   [] Dysphagia   [] Weakness or numbness in arms   [] Weakness or numbness in legs Musculoskeletal:  [x] Arthritis   [] Joint swelling   [x] Joint pain   [] Low back pain Hematologic:  [] Easy bruising  [] Easy bleeding   [] Hypercoagulable state   [] Anemic  [] Hepatitis Gastrointestinal:  [] Blood in stool   [] Vomiting blood  [x] Gastroesophageal reflux/heartburn   [] Abdominal pain Genitourinary:  [] Chronic kidney disease   [] Difficult urination  [] Frequent urination  [] Burning with urination   [] Hematuria Skin:  [] Rashes   [] Ulcers   [] Wounds Psychological:  [] History of anxiety   []   History of major depression.    Physical Exam BP (!) 168/83   Pulse 61   Ht 5\' 4"  (1.626 m)   Wt 169 lb (76.7 kg)   BMI 29.01 kg/m  Gen:  WD/WN, NAD Head: [place]/AT, No temporalis wasting.  Ear/Nose/Throat: Hearing grossly intact, nares w/o erythema or drainage, oropharynx w/o Erythema/Exudate Eyes: Conjunctiva clear, sclera non-icteric  Neck: trachea midline.  No JVD.  Pulmonary:  Good air movement, respirations not labored, no use of accessory muscles  Cardiac: RRR, no JVD Vascular:  Vessel Right Left  Radial Palpable Palpable                                   Gastrointestinal:. No masses, surgical incisions, or scars. Musculoskeletal: M/S 5/5 throughout.  Extremities without ischemic changes.  No deformity or atrophy. No edema. Neurologic: Sensation grossly intact in extremities.  Symmetrical.  Speech is fluent. Motor exam as listed above. Psychiatric: Judgment intact, Mood & affect appropriate for pt's clinical situation. Dermatologic: No rashes or ulcers noted.  No cellulitis or open wounds.    Radiology VAS [place] AAA DUPLEX  Result Date: 02/26/2021 ABDOMINAL AORTA STUDY Patient Name:  NETTIE CROMWELL  Date of Exam:   02/26/2021 Medical Rec #: [id number]         Accession #:    [id number] Date of Birth: Jul 04, 1950         Patient Gender: M Patient Age:   69 years Exam Location:  [place] Procedure:      VAS [place] AAA DUPLEX Referring Phys: Marrianne Mood --------------------------------------------------------------------------------  Indications: Follow up exam for known AAA. Patient indicates he has lower back  pain with a history of lumbar disc herniation and sciatica. He has              known distal AAA. Risk Factors: Hypertension, hyperlipidemia, past history of smoking, prior MI,               coronary artery disease. Vascular Interventions: Left renal artery PTA and stening 2012. Limitations: Air/bowel gas and obesity.  Comparison Study: Prior aorta duplex  exam on 11/12/2019 showed a distal saccular                   AAA measuring 4.2 cm with intramural thrombus. Performing Technologist: Salvadore Dom RVT, RDCS (AE), RDMS  Examination Guidelines: A complete evaluation includes B-mode imaging, spectral Doppler, color Doppler, and power Doppler as needed of all accessible portions of each vessel. Bilateral testing is considered an integral part of a complete examination. Limited examinations for reoccurring indications may be performed as noted.  Abdominal Aorta Findings: +-------------+------+----------+---------+--------+--------+------------------+ Location     AP    Trans (cm)PSV      WaveformThrombusComments                        (cm)            (cm/s)                                      +-------------+------+----------+---------+--------+--------+------------------+ Proximal                                              overlying bowel                                                          gas                +-------------+------+----------+---------+--------+--------+------------------+ Mid          2.70  2.60      33       biphasic                           +-------------+------+----------+---------+--------+--------+------------------+ Distal       4.60  4.50      18       biphasicPresent saccular           +-------------+------+----------+---------+--------+--------+------------------+ RT CIA Prox  1.6   1.5       56       biphasic                           +-------------+------+----------+---------+--------+--------+------------------+ RT CIA Distal                179      biphasic                           +-------------+------+----------+---------+--------+--------+------------------+ RT EIA Prox  1.1   1.1       168      biphasic                           +-------------+------+----------+---------+--------+--------+------------------+  RT EIA Distal                146       biphasic                           +-------------+------+----------+---------+--------+--------+------------------+ LT CIA Prox  1.3   1.2       75       biphasic                           +-------------+------+----------+---------+--------+--------+------------------+ LT CIA Distal                80       biphasic                           +-------------+------+----------+---------+--------+--------+------------------+ LT EIA Prox  1.0   1.2       48       biphasic                           +-------------+------+----------+---------+--------+--------+------------------+ LT EIA Distal                100      biphasic                           +-------------+------+----------+---------+--------+--------+------------------+ Visualization of the [place] artery and Proximal Abdominal Aorta was limited. IVC/Iliac Findings: +--------+------+--------+--------+   IVC   PatentThrombusComments +--------+------+--------+--------+ IVC Proxpatent                 +--------+------+--------+--------+    Summary: Abdominal Aorta: There is evidence of abnormal dilatation of the distal Abdominal aorta. There is evidence of abnormal dilation of the Right Common Iliac artery and Left Common Iliac artery. The largest aortic measurement is 4.6 cm. The largest aortic diameter has increased compared to prior exam. Previous diameter measurement was 4.2 cm obtained on 11/12/2019. Stenosis: Severe atherosclerosis noted throughout aorta and bilateral iliac arteries. No evidence of stenosis seen. IVC/Iliac: There is no evidence of thrombus involving the IVC.  *See table(s) above for measurements and observations. Suggest follow up study in 12 months.  Electronically signed by Jenkins Rouge MD on 02/26/2021 at 2:40:41 PM.    Final    ECHOCARDIOGRAM COMPLETE  Result Date: 02/26/2021    ECHOCARDIOGRAM REPORT   Patient Name:   ALONZO OWCZARZAK Date of Exam: 02/26/2021 Medical Rec #:  [id number]         Height:       63.7 in Accession #:    [id number]       Weight:       169.0 lb Date of Birth:  08-Apr-1951        BSA:          1.816 m Patient Age:    66 years         BP:           140/80 mmHg Patient Gender: M                HR:           57 bpm. Exam Location:  [place] Procedure: 2D Echo, Cardiac Doppler, Color Doppler and Intracardiac            Opacification Agent Indications:    R06.9 DOE  History:  Patient has prior history of Echocardiogram examinations, most                 recent 10/29/2017. CAD, CABG, COPD and Distal AAA, left renal                 arterial stent, Signs/Symptoms:Dyspnea; Risk                 Factors:Hypertension, Dyslipidemia and Former Smoker. Patient                 denies chest pain, SOB and leg edema. He does have increasing                 DOE.  Sonographer:    Salvadore Dom RVT, RDCS (AE), RDMS Referring Phys: [id number] Arvil Chaco  Sonographer Comments: Suboptimal apical window and patient is morbidly obese. Image acquisition challenging due to patient body habitus and Image acquisition challenging due to COPD. IMPRESSIONS  1. Challenging images  2. Left ventricular ejection fraction, by estimation, is 50 to 55%. The left ventricle has low normal function. The left ventricle demonstrates regional wall motion abnormalities (Basal to mid inferior and basal inferolateral wall hypokinesis). Left ventricular diastolic parameters are consistent with Grade I diastolic dysfunction (impaired relaxation).  3. Right ventricular systolic function is normal. The right ventricular size is normal.  4. The mitral valve is normal in structure. Mild mitral valve regurgitation. No evidence of mitral stenosis.  5. The aortic valve is normal in structure. Trileaflet. Aortic valve regurgitation is not visualized. Mild aortic valve sclerosis is present, with no evidence of aortic valve stenosis.  6. There is borderline dilatation of the aortic root, measuring 36 mm. There is borderline  dilatation of the ascending aorta, measuring 36 mm.  7. The inferior vena cava is normal in size with greater than 50% respiratory variability, suggesting right atrial pressure of 3 mmHg. Comparison(s): EF 50%, Akinesis of the basal inferior and basal inferolateral myocardium; severe hypokinesis of the mid inferior and mid inferolateral myocardium. FINDINGS  Left Ventricle: Left ventricular ejection fraction, by estimation, is 50 to 55%. The left ventricle has low normal function. The left ventricle demonstrates regional wall motion abnormalities. Definity contrast agent was given IV to delineate the left ventricular endocardial borders. The left ventricular internal cavity size was normal in size. There is no left ventricular hypertrophy. Left ventricular diastolic parameters are consistent with Grade I diastolic dysfunction (impaired relaxation). Right Ventricle: The right ventricular size is normal. No increase in right ventricular wall thickness. Right ventricular systolic function is normal. Left Atrium: Left atrial size was normal in size. Right Atrium: Right atrial size was normal in size. Pericardium: There is no evidence of pericardial effusion. Mitral Valve: The mitral valve is normal in structure. Mild mitral valve regurgitation. No evidence of mitral valve stenosis. Tricuspid Valve: The tricuspid valve is normal in structure. Tricuspid valve regurgitation is mild . No evidence of tricuspid stenosis. Aortic Valve: The aortic valve is normal in structure. Aortic valve regurgitation is mild. Mild to moderate aortic valve sclerosis/calcification is present, without any evidence of aortic stenosis. Aortic valve mean gradient measures 3.0 mmHg. Aortic valve peak gradient measures 5.8 mmHg. Aortic valve area, by VTI measures 2.71 cm. Pulmonic Valve: The pulmonic valve was normal in structure. Pulmonic valve regurgitation is not visualized. No evidence of pulmonic stenosis. Aorta: The aortic root is normal in  size and structure. There is borderline dilatation of the aortic root, measuring 36 mm. There is borderline  dilatation of the ascending aorta, measuring 36 mm. Venous: The inferior vena cava is normal in size with greater than 50% respiratory variability, suggesting right atrial pressure of 3 mmHg. IAS/Shunts: No atrial level shunt detected by color flow Doppler.  LEFT VENTRICLE PLAX 2D LVIDd:         3.90 cm      Diastology LVIDs:         2.90 cm      LV e' medial:    4.30 cm/s LV PW:         0.90 cm      LV E/e' medial:  9.8 LV IVS:        1.00 cm      LV e' lateral:   6.16 cm/s LVOT diam:     2.20 cm      LV E/e' lateral: 6.8 LV SV:         73 LV SV Index:   40 LVOT Area:     3.80 cm  LV Volumes (MOD) LV vol d, MOD A2C: 104.0 ml LV vol d, MOD A4C: 87.5 ml LV vol s, MOD A2C: 40.5 ml LV vol s, MOD A4C: 38.3 ml LV SV MOD A2C:     63.5 ml LV SV MOD A4C:     87.5 ml LV SV MOD BP:      56.0 ml RIGHT VENTRICLE RV S prime:     5.84 cm/s TAPSE (M-mode): 1.1 cm LEFT ATRIUM             Index        RIGHT ATRIUM           Index LA diam:        3.90 cm 2.15 cm/m   RA Area:     13.80 cm LA Vol (A2C):   52.5 ml 28.92 ml/m  RA Volume:   39.40 ml  21.70 ml/m LA Vol (A4C):   38.0 ml 20.93 ml/m LA Biplane Vol: 46.3 ml 25.50 ml/m  AORTIC VALVE                    PULMONIC VALVE AV Area (Vmax):    2.91 cm     PV Vmax:          0.81 m/s AV Area (Vmean):   2.42 cm     PV Peak grad:     2.6 mmHg AV Area (VTI):     2.71 cm     PR End Diast Vel: 0.28 msec AV Vmax:           120.00 cm/s AV Vmean:          82.400 cm/s AV VTI:            0.269 m AV Peak Grad:      5.8 mmHg AV Mean Grad:      3.0 mmHg LVOT Vmax:         92.00 cm/s LVOT Vmean:        52.500 cm/s LVOT VTI:          0.192 m LVOT/AV VTI ratio: 0.71 AR Vena Contracta: 0.10 cm  AORTA Ao Root diam: 3.30 cm Ao Asc diam:  3.55 cm Ao Arch diam: 2.8 cm MITRAL VALVE               TRICUSPID VALVE MV Area (PHT): 1.36 cm    TR Peak grad:   9.9 mmHg MV Decel Time: 556 msec    TR  Vmax:        157.00 cm/s MV E velocity: 42.00 cm/s MV A velocity: 86.50 cm/s  SHUNTS MV E/A ratio:  0.49        Systemic VTI:  0.19 m                            Systemic Diam: 2.20 cm [name] Electronically signed by [name] Signature Date/Time: 02/26/2021/6:22:18 PM    Final     Labs Recent Results (from the past 2160 hour(s))  Hepatic function panel     Status: None   Collection Time: 02/20/21 10:27 AM  Result Value Ref Range   Total Bilirubin 0.5 0.2 - 1.2 mg/dL   Bilirubin, Direct 0.1 0.0 - 0.3 mg/dL   Alkaline Phosphatase 42 39 - 117 U/L   AST 29 0 - 37 U/L   ALT 20 0 - 53 U/L   Total Protein 7.2 6.0 - 8.3 g/dL   Albumin 4.0 3.5 - 5.2 g/dL  Renal function panel     Status: Abnormal   Collection Time: 02/20/21 10:27 AM  Result Value Ref Range   Sodium 136 135 - 145 mEq/L   Potassium 3.9 3.5 - 5.1 mEq/L   Chloride 102 96 - 112 mEq/L   CO2 28 19 - 32 mEq/L   Albumin 4.0 3.5 - 5.2 g/dL   BUN 4 (L) 6 - 23 mg/dL   Creatinine, Ser 0.81 0.40 - 1.50 mg/dL   Glucose, Bld 102 (H) 70 - 99 mg/dL   Phosphorus 2.5 2.3 - 4.6 mg/dL   GFR 89.38 >60.00 mL/min    Comment: Calculated using the CKD-EPI Creatinine Equation (2021)   Calcium 9.3 8.4 - 10.5 mg/dL  Lipid panel     Status: Abnormal   Collection Time: 02/20/21 10:27 AM  Result Value Ref Range   Cholesterol 138 0 - 200 mg/dL    Comment: ATP III Classification       Desirable:  < 200 mg/dL               Borderline High:  200 - 239 mg/dL          High:  > = 240 mg/dL   Triglycerides 206.0 (H) 0.0 - 149.0 mg/dL    Comment: Normal:  <150 mg/dLBorderline High:  150 - 199 mg/dL   HDL 52.20 >39.00 mg/dL   VLDL 41.2 (H) 0.0 - 40.0 mg/dL   Total CHOL/HDL Ratio 3     Comment:                Men          Women1/2 Average Risk     3.4          3.3Average Risk          5.0          4.42X Average Risk          9.6          7.13X Average Risk          15.0          11.0                       NonHDL 85.88     Comment: NOTE:   Non-HDL goal should be 30 mg/dL higher than patient's LDL goal (i.e. LDL goal of < 70 mg/dL, would have non-HDL goal of <  100 mg/dL)  CBC     Status: Abnormal   Collection Time: 02/20/21 10:27 AM  Result Value Ref Range   WBC 7.1 4.0 - 10.5 K/uL   RBC 4.52 4.22 - 5.81 Mil/uL   Platelets 327.0 150.0 - 400.0 K/uL   Hemoglobin 13.5 13.0 - 17.0 g/dL   HCT 41.4 39.0 - 52.0 %   MCV 91.7 78.0 - 100.0 fl   MCHC 32.7 30.0 - 36.0 g/dL   RDW 16.3 (H) 11.5 - 15.5 %  LDL cholesterol, direct     Status: None   Collection Time: 02/20/21 10:27 AM  Result Value Ref Range   Direct LDL 65.0 mg/dL    Comment: Optimal:  <100 mg/dLNear or Above Optimal:  100-129 mg/dLBorderline High:  130-159 mg/dLHigh:  160-189 mg/dLVery High:  >190 mg/dL  ECHOCARDIOGRAM COMPLETE     Status: None   Collection Time: 02/26/21 11:07 AM  Result Value Ref Range   AR max vel 2.91 cm2   AV Peak grad 5.8 mmHg   Ao pk vel 1.20 m/s   S' Lateral 2.90 cm   Area-P 1/2 1.36 cm2   AV Area VTI 2.71 cm2   AV Mean grad 3.0 mmHg   Single Plane A4C EF 56.2 %   Single Plane A2C EF 61.1 %   Calc EF 58.6 %   AV Area mean vel 2.42 cm2   AV Vena cont 0.10 cm    Assessment/Plan:  AAA (abdominal aortic aneurysm) without rupture A recent duplex demonstrates a 4.6 cm infrarenal abdominal aortic aneurysm.  This was 4.2 cm a little over a year ago.  This has grown approximately a centimeter over a decade.  We did discuss that they tend to grow faster the bigger they get.  At this point, it is still not large enough for prophylactic repair but it is approaching the size.  I would recommend a follow-up duplex in 3 months and if he shows continued growth, we will likely proceed with a CT angiogram for consideration for repair.  I discussed the pathophysiology and natural history of abdominal aortic aneurysm.  I discussed the reason and rationale for treatment.  He and his wife voiced their understanding.  Essential hypertension blood pressure  control important in reducing the progression of atherosclerotic disease and aneurysmal growth. On appropriate oral medications.   Hyperlipidemia LDL goal <70 lipid control important in reducing the progression of atherosclerotic disease. Continue statin therapy      Leotis Pain 03/17/2021, 11:39 AM   This note was created with Dragon medical transcription system.  Any errors from dictation are unintentional.

## 2021-03-17 NOTE — Assessment & Plan Note (Signed)
A recent duplex demonstrates a 4.6 cm infrarenal abdominal aortic aneurysm.  This was 4.2 cm a little over a year ago.  This has grown approximately a centimeter over a decade.  We did discuss that they tend to grow faster the bigger they get.  At this point, it is still not large enough for prophylactic repair but it is approaching the size.  I would recommend a follow-up duplex in 3 months and if he shows continued growth, we will likely proceed with a CT angiogram for consideration for repair.  I discussed the pathophysiology and natural history of abdominal aortic aneurysm.  I discussed the reason and rationale for treatment.  He and his wife voiced their understanding.

## 2021-03-17 NOTE — Patient Instructions (Signed)
Abdominal Aortic Aneurysm  An abdominal aortic aneurysm (AAA) is an aneurysm that occurs in the lower part of the aorta. The aorta is the main artery of the body, and it supplies blood from the heart to the rest of the body. An aneurysm is a bulge in an artery. Ananeurysm happens when blood pushes against a weakened or damaged artery wall. Most aneurysms do not cause symptoms, but some do cause problems. An AAA can cause two serious problems: It can enlarge and burst. It can cause blood to flow between the layers of the wall of the aorta through a tear (aortic dissection). These problems are medical emergencies. They can cause bleeding inside the body. If they are not diagnosed and treated right away, they can belife-threatening. What are the causes? The exact cause of this condition is not known. What increases the risk? The following factors may make you more likely to develop this condition: Being male and 60 years of age or older. Being of [place] descent. Using or having used nicotine or tobacco products. Having a family history of aneurysms. Having any of these conditions: Hardening of your arteries (arteriosclerosis). Inflammation of the walls of an artery (arteritis). Certain genetic conditions. Obesity. An infection in the wall of your aorta (infectious aortitis) caused by bacteria. High cholesterol. High blood pressure (hypertension). What are the signs or symptoms? Symptoms of this condition vary depending on the size of your aneurysm and how fast it is growing. Most aneurysms grow slowly and do not cause symptoms. When symptoms do occur, they may include: Severe pain in your abdomen, side, or lower back. Feeling full after eating only small amounts of food. Feeling a throbbing lump in your abdomen. Painful feet or toes, or discolored skin or sores on feet or toes. Constipation or trouble urinating. Symptoms of an AAA that has burst include: Severe pain in your  abdomen, side, or back that comes on suddenly. Nausea or vomiting. Feeling light-headed or fainting. How is this diagnosed? This condition may be diagnosed with: A physical exam to check for throbbing and to listen to blood flow in your abdomen. Tests, such as: Ultrasound. X-rays. CT scan. MRI. Angiograms. These tests check your arteries for damage or blockage. Because most AAAs that have not burst do not cause symptoms, they are oftenfound during exams for other conditions. How is this treated? Treatment for this condition depends on: The size of your aneurysm. How fast your aneurysm is growing. Your age. Risk factors for a burst AAA. If your aneurysm is smaller than 2 inches (5 cm), your health care provider may: Monitor it regularly to see if it is getting bigger. Depending on the size of the aneurysm, how fast it is growing, and your other risk factors, you may have an ultrasound to monitor it every 3-6 months, every year, or every few years. Give you medicines to control blood pressure, treat pain, or fight infection. If your aneurysm is larger than 2 inches (5 cm), your health care provider mayrepair it with surgery. Follow these instructions at home: Eating and drinking  Eat a heart-healthy diet. This includes plenty of fresh fruits and vegetables, whole grains, low-fat (lean) protein, and low-fat dairy products. Avoid foods that are high in saturated fat and cholesterol, such as red meat and some dairy products.  Lifestyle     Do not use any products that contain nicotine or tobacco, such as cigarettes, e-cigarettes, and chewing tobacco. If you need help quitting, ask your health care provider.   Stay physically active and exercise regularly. Talk with your health care provider about how often to exercise and which types of exercise are safe for you. Maintain a healthy weight. Alcohol use Do not drink alcohol if: Your health care provider tells you not to drink. You are  pregnant, may be pregnant, or are planning to become pregnant. If you drink alcohol: Limit how much you use to: 0-1 drink a day for women. 0-2 drinks a day for men. Be aware of how much alcohol is in your drink. In the [place] one drink equals one 12 oz bottle of beer (355 mL), one 5 oz glass of wine (148 mL), or one 1 oz glass of hard liquor (44 mL). General instructions Take over-the-counter and prescription medicines only as told by your health care provider. Keep your blood pressure within a normal range. Check it regularly, and ask your health care provider what your target blood pressure should be. Have your blood sugar (glucose) level and cholesterol levels checked regularly. Follow instructions on how to keep levels within normal limits. Avoid heavy lifting and activities that take a lot of effort. Ask what activities are safe for you. If you can, learn your family's health history. Keep all follow-up visits as told by your health care provider. This is important. Contact a health care provider if you have: Pain in your abdomen, side, or back. Throbbing in your abdomen. A fever. Get help right away if: You have sudden, severe pain in your abdomen, side, or back. You experience nausea or vomiting. You feel light-headed or you faint. Your heart beats fast when you stand. You have sweaty, clammy skin. You have shortness of breath. You have constipation or trouble urinating. These symptoms may represent a serious problem that is an emergency. Do not wait to see if the symptoms will go away. Get medical help right away. Call your local emergency services (911 in the [place] Do not drive yourself to the hospital. Summary An aneurysm is a bulge in an artery. An abdominal aortic aneurysm (AAA) is an aneurysm in the lower part of the aorta. An AAA can cause bleeding inside the body, and it can be life-threatening. Risk can increase if you are male, age 60 or older, and of [place]  descent, or if you have used nicotine or tobacco products and have a family history of aneurysms. Get help right away if you have symptoms of a burst AAA. This information is not intended to replace advice given to you by your health care provider. Make sure you discuss any questions you have with your healthcare provider. Document Revised: 01/26/2019 Document Reviewed: 01/26/2019 Elsevier Patient Education  2022 Elsevier Inc.  

## 2021-03-17 NOTE — Assessment & Plan Note (Signed)
blood pressure control important in reducing the progression of atherosclerotic disease and aneurysmal growth. On appropriate oral medications.  

## 2021-03-17 NOTE — Assessment & Plan Note (Signed)
lipid control important in reducing the progression of atherosclerotic disease. Continue statin therapy  

## 2021-04-03 DIAGNOSIS — M5416 Radiculopathy, lumbar region: Secondary | ICD-10-CM | POA: Diagnosis not present

## 2021-04-05 ENCOUNTER — Other Ambulatory Visit: Payer: Self-pay | Admitting: Internal Medicine

## 2021-04-11 ENCOUNTER — Other Ambulatory Visit: Payer: Self-pay | Admitting: Cardiovascular Disease

## 2021-04-28 DIAGNOSIS — M5416 Radiculopathy, lumbar region: Secondary | ICD-10-CM | POA: Diagnosis not present

## 2021-04-29 ENCOUNTER — Other Ambulatory Visit: Payer: Self-pay | Admitting: Cardiovascular Disease

## 2021-05-13 ENCOUNTER — Inpatient Hospital Stay (HOSPITAL_COMMUNITY)
Admission: EM | Admit: 2021-05-13 | Discharge: 2021-05-15 | DRG: 251 | Disposition: A | Discharge status: Home / Self Care | Payer: Medicare Other | Attending: Cardiovascular Disease | Admitting: Cardiovascular Disease

## 2021-05-13 ENCOUNTER — Inpatient Hospital Stay (HOSPITAL_COMMUNITY): Payer: Medicare Other

## 2021-05-13 ENCOUNTER — Emergency Department (HOSPITAL_COMMUNITY): Payer: Medicare Other

## 2021-05-13 ENCOUNTER — Encounter (HOSPITAL_COMMUNITY): Payer: Self-pay

## 2021-05-13 ENCOUNTER — Inpatient Hospital Stay (HOSPITAL_COMMUNITY)
Admission: EM | Disposition: A | Discharge status: Home / Self Care | Payer: Self-pay | Source: Home / Self Care | Attending: Cardiovascular Disease

## 2021-05-13 ENCOUNTER — Other Ambulatory Visit: Payer: Self-pay

## 2021-05-13 DIAGNOSIS — I5042 Chronic combined systolic (congestive) and diastolic (congestive) heart failure: Secondary | ICD-10-CM | POA: Diagnosis present

## 2021-05-13 DIAGNOSIS — Z7902 Long term (current) use of antithrombotics/antiplatelets: Secondary | ICD-10-CM | POA: Diagnosis not present

## 2021-05-13 DIAGNOSIS — I255 Ischemic cardiomyopathy: Secondary | ICD-10-CM | POA: Diagnosis present

## 2021-05-13 DIAGNOSIS — E1151 Type 2 diabetes mellitus with diabetic peripheral angiopathy without gangrene: Secondary | ICD-10-CM | POA: Diagnosis present

## 2021-05-13 DIAGNOSIS — Y712 Prosthetic and other implants, materials and accessory cardiovascular devices associated with adverse incidents: Secondary | ICD-10-CM | POA: Diagnosis present

## 2021-05-13 DIAGNOSIS — I5032 Chronic diastolic (congestive) heart failure: Secondary | ICD-10-CM | POA: Diagnosis present

## 2021-05-13 DIAGNOSIS — Z743 Need for continuous supervision: Secondary | ICD-10-CM | POA: Diagnosis not present

## 2021-05-13 DIAGNOSIS — I214 Non-ST elevation (NSTEMI) myocardial infarction: Secondary | ICD-10-CM | POA: Diagnosis not present

## 2021-05-13 DIAGNOSIS — E785 Hyperlipidemia, unspecified: Secondary | ICD-10-CM | POA: Diagnosis present

## 2021-05-13 DIAGNOSIS — Z7982 Long term (current) use of aspirin: Secondary | ICD-10-CM | POA: Diagnosis not present

## 2021-05-13 DIAGNOSIS — I499 Cardiac arrhythmia, unspecified: Secondary | ICD-10-CM | POA: Diagnosis not present

## 2021-05-13 DIAGNOSIS — I25810 Atherosclerosis of coronary artery bypass graft(s) without angina pectoris: Secondary | ICD-10-CM | POA: Diagnosis present

## 2021-05-13 DIAGNOSIS — I2510 Atherosclerotic heart disease of native coronary artery without angina pectoris: Secondary | ICD-10-CM | POA: Diagnosis not present

## 2021-05-13 DIAGNOSIS — R0789 Other chest pain: Secondary | ICD-10-CM | POA: Diagnosis not present

## 2021-05-13 DIAGNOSIS — I252 Old myocardial infarction: Secondary | ICD-10-CM | POA: Diagnosis not present

## 2021-05-13 DIAGNOSIS — Z87891 Personal history of nicotine dependence: Secondary | ICD-10-CM | POA: Diagnosis not present

## 2021-05-13 DIAGNOSIS — Z79899 Other long term (current) drug therapy: Secondary | ICD-10-CM

## 2021-05-13 DIAGNOSIS — I110 Hypertensive heart disease with heart failure: Secondary | ICD-10-CM | POA: Diagnosis present

## 2021-05-13 DIAGNOSIS — Z20822 Contact with and (suspected) exposure to covid-19: Secondary | ICD-10-CM | POA: Diagnosis present

## 2021-05-13 DIAGNOSIS — R079 Chest pain, unspecified: Secondary | ICD-10-CM | POA: Diagnosis not present

## 2021-05-13 DIAGNOSIS — T82855A Stenosis of coronary artery stent, initial encounter: Secondary | ICD-10-CM | POA: Diagnosis present

## 2021-05-13 DIAGNOSIS — I469 Cardiac arrest, cause unspecified: Secondary | ICD-10-CM | POA: Diagnosis not present

## 2021-05-13 DIAGNOSIS — Z8249 Family history of ischemic heart disease and other diseases of the circulatory system: Secondary | ICD-10-CM

## 2021-05-13 DIAGNOSIS — F1010 Alcohol abuse, uncomplicated: Secondary | ICD-10-CM | POA: Diagnosis present

## 2021-05-13 DIAGNOSIS — K219 Gastro-esophageal reflux disease without esophagitis: Secondary | ICD-10-CM | POA: Diagnosis present

## 2021-05-13 DIAGNOSIS — J449 Chronic obstructive pulmonary disease, unspecified: Secondary | ICD-10-CM | POA: Diagnosis present

## 2021-05-13 DIAGNOSIS — Z951 Presence of aortocoronary bypass graft: Secondary | ICD-10-CM

## 2021-05-13 DIAGNOSIS — I10 Essential (primary) hypertension: Secondary | ICD-10-CM | POA: Diagnosis present

## 2021-05-13 HISTORY — PX: INTRAVASCULAR PRESSURE WIRE/FFR STUDY: CATH118243

## 2021-05-13 HISTORY — PX: LEFT HEART CATH AND CORONARY ANGIOGRAPHY: CATH118249

## 2021-05-13 LAB — TROPONIN I (HIGH SENSITIVITY)
Troponin I (High Sensitivity): 206 ng/L (ref <18)
Troponin I (High Sensitivity): 524 ng/L (ref <18)
Troponin I (High Sensitivity): 1256 ng/L (ref <18)

## 2021-05-13 LAB — CBC
HCT: 34.8 % — ABNORMAL LOW (ref 39.0–52.0)
Hemoglobin: 11.5 g/dL — ABNORMAL LOW (ref 13.0–17.0)
MCH: 29.3 pg (ref 26.0–34.0)
MCHC: 33 g/dL (ref 30.0–36.0)
MCV: 88.5 fL (ref 80.0–100.0)
Platelets: 274 10*3/uL (ref 150–400)
RBC: 3.93 MIL/uL — ABNORMAL LOW (ref 4.22–5.81)
RDW: 16.3 % — ABNORMAL HIGH (ref 11.5–15.5)
WBC: 9.3 10*3/uL (ref 4.0–10.5)
nRBC: 0 % (ref 0.0–0.2)

## 2021-05-13 LAB — CBC WITH DIFFERENTIAL/PLATELET
Abs Immature Granulocytes: 0.04 10*3/uL (ref 0.00–0.07)
Basophils Absolute: 0 10*3/uL (ref 0.0–0.1)
Basophils Relative: 0 %
Eosinophils Absolute: 0.1 10*3/uL (ref 0.0–0.5)
Eosinophils Relative: 1 %
HCT: 36.8 % — ABNORMAL LOW (ref 39.0–52.0)
Hemoglobin: 12.2 g/dL — ABNORMAL LOW (ref 13.0–17.0)
Immature Granulocytes: 1 %
Lymphocytes Relative: 21 %
Lymphs Abs: 1.8 10*3/uL (ref 0.7–4.0)
MCH: 29 pg (ref 26.0–34.0)
MCHC: 33.2 g/dL (ref 30.0–36.0)
MCV: 87.6 fL (ref 80.0–100.0)
Monocytes Absolute: 0.9 10*3/uL (ref 0.1–1.0)
Monocytes Relative: 11 %
Neutro Abs: 5.8 10*3/uL (ref 1.7–7.7)
Neutrophils Relative %: 66 %
Platelets: 325 10*3/uL (ref 150–400)
RBC: 4.2 MIL/uL — ABNORMAL LOW (ref 4.22–5.81)
RDW: 16.4 % — ABNORMAL HIGH (ref 11.5–15.5)
WBC: 8.7 10*3/uL (ref 4.0–10.5)
nRBC: 0 % (ref 0.0–0.2)

## 2021-05-13 LAB — BASIC METABOLIC PANEL
Anion gap: 4 — ABNORMAL LOW (ref 5–15)
BUN: 9 mg/dL (ref 8–23)
CO2: 24 mmol/L (ref 22–32)
Calcium: 8.6 mg/dL — ABNORMAL LOW (ref 8.9–10.3)
Chloride: 106 mmol/L (ref 98–111)
Creatinine, Ser: 0.73 mg/dL (ref 0.61–1.24)
GFR, Estimated: >60 mL/min (ref >60)
Glucose, Bld: 111 mg/dL — ABNORMAL HIGH (ref 70–99)
Potassium: 4 mmol/L (ref 3.5–5.1)
Sodium: 134 mmol/L — ABNORMAL LOW (ref 135–145)

## 2021-05-13 LAB — POCT ACTIVATED CLOTTING TIME
Activated Clotting Time: 305 seconds
Activated Clotting Time: 215 seconds
Activated Clotting Time: 191 seconds

## 2021-05-13 LAB — COMPREHENSIVE METABOLIC PANEL
ALT: 23 U/L (ref 0–44)
AST: 38 U/L (ref 15–41)
Albumin: 3.1 g/dL — ABNORMAL LOW (ref 3.5–5.0)
Alkaline Phosphatase: 33 U/L — ABNORMAL LOW (ref 38–126)
Anion gap: 9 (ref 5–15)
BUN: 9 mg/dL (ref 8–23)
CO2: 23 mmol/L (ref 22–32)
Calcium: 8.8 mg/dL — ABNORMAL LOW (ref 8.9–10.3)
Chloride: 100 mmol/L (ref 98–111)
Creatinine, Ser: 0.65 mg/dL (ref 0.61–1.24)
GFR, Estimated: >60 mL/min (ref >60)
Glucose, Bld: 111 mg/dL — ABNORMAL HIGH (ref 70–99)
Potassium: 4.5 mmol/L (ref 3.5–5.1)
Sodium: 132 mmol/L — ABNORMAL LOW (ref 135–145)
Total Bilirubin: 1.4 mg/dL — ABNORMAL HIGH (ref 0.3–1.2)
Total Protein: 5.9 g/dL — ABNORMAL LOW (ref 6.5–8.1)

## 2021-05-13 LAB — RESP PANEL BY RT-PCR (FLU A&B, COVID) ARPGX2
Influenza A by PCR: NEGATIVE
Influenza B by PCR: NEGATIVE
SARS Coronavirus 2 by RT PCR: NEGATIVE

## 2021-05-13 LAB — I-STAT CHEM 8, ED
BUN: 10 mg/dL (ref 8–23)
Calcium, Ion: 1.17 mmol/L (ref 1.15–1.40)
Chloride: 101 mmol/L (ref 98–111)
Creatinine, Ser: 0.6 mg/dL — ABNORMAL LOW (ref 0.61–1.24)
Glucose, Bld: 109 mg/dL — ABNORMAL HIGH (ref 70–99)
HCT: 39 % (ref 39.0–52.0)
Hemoglobin: 13.3 g/dL (ref 13.0–17.0)
Potassium: 4.7 mmol/L (ref 3.5–5.1)
Sodium: 133 mmol/L — ABNORMAL LOW (ref 135–145)
TCO2: 25 mmol/L (ref 22–32)

## 2021-05-13 LAB — HEPARIN LEVEL (UNFRACTIONATED): Heparin Unfractionated: 0.4 IU/mL (ref 0.30–0.70)

## 2021-05-13 SURGERY — LEFT HEART CATH AND CORONARY ANGIOGRAPHY
Anesthesia: LOCAL

## 2021-05-13 MED ORDER — SODIUM CHLORIDE 0.9 % WEIGHT BASED INFUSION: 3.0000 mL/kg/h | INTRAVENOUS | Status: DC

## 2021-05-13 MED ORDER — VERAPAMIL HCL 2.5 MG/ML IV SOLN
INTRAVENOUS | Status: AC
  Filled 2021-05-13: qty 2

## 2021-05-13 MED ORDER — LIDOCAINE HCL (PF) 1 % IJ SOLN
INTRAMUSCULAR | Status: DC | PRN
  Administered 2021-05-13: 12 mL

## 2021-05-13 MED ORDER — CARVEDILOL 3.125 MG PO TABS
3.1250 mg | ORAL_TABLET | Freq: Two times a day (BID) | ORAL | Status: DC
  Administered 2021-05-13 – 2021-05-15 (×4): 3.125 mg via ORAL
  Filled 2021-05-13 (×4): qty 1

## 2021-05-13 MED ORDER — CLOPIDOGREL BISULFATE 75 MG PO TABS
75.0000 mg | ORAL_TABLET | Freq: Every day | ORAL | Status: DC
Start: 2021-05-13 — End: 2021-05-15
  Administered 2021-05-14 – 2021-05-15 (×3): 75 mg via ORAL
  Filled 2021-05-13 (×3): qty 1

## 2021-05-13 MED ORDER — SODIUM CHLORIDE 0.9 % IV SOLN: 250.0000 mL | INTRAVENOUS | Status: DC | PRN

## 2021-05-13 MED ORDER — PANTOPRAZOLE SODIUM 40 MG PO TBEC: 40.0000 mg | DELAYED_RELEASE_TABLET | Freq: Every day | ORAL | Status: DC

## 2021-05-13 MED ORDER — ASPIRIN 81 MG PO CHEW: 81.0000 mg | CHEWABLE_TABLET | ORAL | Status: DC

## 2021-05-13 MED ORDER — ROSUVASTATIN CALCIUM 20 MG PO TABS
40.0000 mg | ORAL_TABLET | Freq: Every day | ORAL | Status: DC
Start: 2021-05-13 — End: 2021-05-15
  Administered 2021-05-14 – 2021-05-15 (×2): 40 mg via ORAL
  Filled 2021-05-13 (×2): qty 2

## 2021-05-13 MED ORDER — SODIUM CHLORIDE 0.9% FLUSH
3.0000 mL | Freq: Two times a day (BID) | INTRAVENOUS | Status: DC
  Administered 2021-05-14 (×2): 3 mL via INTRAVENOUS

## 2021-05-13 MED ORDER — NITROGLYCERIN IN D5W 200-5 MCG/ML-% IV SOLN
0.0000 ug/min | INTRAVENOUS | Status: DC
  Administered 2021-05-13: 5 ug/min via INTRAVENOUS
  Administered 2021-05-14: 20 ug/min via INTRAVENOUS
  Filled 2021-05-13 (×2): qty 250

## 2021-05-13 MED ORDER — SODIUM CHLORIDE 0.9% FLUSH: 3.0000 mL | INTRAVENOUS | Status: DC | PRN

## 2021-05-13 MED ORDER — EZETIMIBE 10 MG PO TABS
10.0000 mg | ORAL_TABLET | Freq: Every day | ORAL | Status: DC
Start: 2021-05-13 — End: 2021-05-15
  Administered 2021-05-14 – 2021-05-15 (×2): 10 mg via ORAL
  Filled 2021-05-13 (×2): qty 1

## 2021-05-13 MED ORDER — GABAPENTIN 100 MG PO CAPS
100.0000 mg | ORAL_CAPSULE | Freq: Three times a day (TID) | ORAL | Status: DC
  Administered 2021-05-13 – 2021-05-15 (×5): 100 mg via ORAL
  Filled 2021-05-13 (×5): qty 1

## 2021-05-13 MED ORDER — HEPARIN BOLUS VIA INFUSION
4000.0000 [IU] | Freq: Once | INTRAVENOUS | Status: AC
  Administered 2021-05-13: 4000 [IU] via INTRAVENOUS
  Filled 2021-05-13: qty 4000

## 2021-05-13 MED ORDER — ISOSORBIDE MONONITRATE ER 30 MG PO TB24: 30.0000 mg | ORAL_TABLET | Freq: Every day | ORAL | Status: DC

## 2021-05-13 MED ORDER — ACETAMINOPHEN 325 MG PO TABS: 650.0000 mg | ORAL_TABLET | ORAL | Status: DC | PRN

## 2021-05-13 MED ORDER — LABETALOL HCL 5 MG/ML IV SOLN: 10.0000 mg | INTRAVENOUS | Status: AC | PRN

## 2021-05-13 MED ORDER — MIDAZOLAM HCL 2 MG/2ML IJ SOLN
INTRAMUSCULAR | Status: DC | PRN
  Administered 2021-05-13 (×2): 1 mg via INTRAVENOUS

## 2021-05-13 MED ORDER — SODIUM CHLORIDE 0.9% FLUSH: 3.0000 mL | Freq: Two times a day (BID) | INTRAVENOUS | Status: DC

## 2021-05-13 MED ORDER — ALUM & MAG HYDROXIDE-SIMETH 200-200-20 MG/5ML PO SUSP
30.0000 mL | Freq: Once | ORAL | Status: AC
  Administered 2021-05-13: 30 mL via ORAL
  Filled 2021-05-13: qty 30

## 2021-05-13 MED ORDER — HEPARIN (PORCINE) 25000 UT/250ML-% IV SOLN
1000.0000 [IU]/h | INTRAVENOUS | Status: DC
  Administered 2021-05-13: 1000 [IU]/h via INTRAVENOUS
  Filled 2021-05-13: qty 250

## 2021-05-13 MED ORDER — IOHEXOL 350 MG/ML SOLN
INTRAVENOUS | Status: DC | PRN
  Administered 2021-05-13: 145 mL

## 2021-05-13 MED ORDER — HEPARIN SODIUM (PORCINE) 1000 UNIT/ML IJ SOLN
INTRAMUSCULAR | Status: AC
  Filled 2021-05-13: qty 10

## 2021-05-13 MED ORDER — ONDANSETRON HCL 4 MG/2ML IJ SOLN: 4.0000 mg | Freq: Four times a day (QID) | INTRAMUSCULAR | Status: DC | PRN

## 2021-05-13 MED ORDER — PANTOPRAZOLE SODIUM 40 MG PO TBEC
40.0000 mg | DELAYED_RELEASE_TABLET | ORAL | Status: AC
  Administered 2021-05-13: 40 mg via ORAL
  Filled 2021-05-13: qty 1

## 2021-05-13 MED ORDER — SODIUM CHLORIDE 0.9 % WEIGHT BASED INFUSION
1.0000 mL/kg/h | INTRAVENOUS | Status: DC
  Administered 2021-05-13: 0.328 mL/kg/h via INTRAVENOUS

## 2021-05-13 MED ORDER — HEPARIN SODIUM (PORCINE) 1000 UNIT/ML IJ SOLN
INTRAMUSCULAR | Status: DC | PRN
  Administered 2021-05-13: 5000 [IU] via INTRAVENOUS
  Administered 2021-05-13: 2000 [IU] via INTRAVENOUS

## 2021-05-13 MED ORDER — NITROGLYCERIN 0.4 MG SL SUBL: 0.4000 mg | SUBLINGUAL_TABLET | SUBLINGUAL | Status: DC | PRN

## 2021-05-13 MED ORDER — ASPIRIN 81 MG PO CHEW: 81.0000 mg | CHEWABLE_TABLET | Freq: Every day | ORAL | Status: DC

## 2021-05-13 MED ORDER — FENTANYL CITRATE PF 50 MCG/ML IJ SOSY
50.0000 ug | PREFILLED_SYRINGE | Freq: Once | INTRAMUSCULAR | Status: AC
  Administered 2021-05-13: 50 ug via INTRAVENOUS
  Filled 2021-05-13: qty 1

## 2021-05-13 MED ORDER — MIDAZOLAM HCL 2 MG/2ML IJ SOLN
INTRAMUSCULAR | Status: AC
  Filled 2021-05-13: qty 2

## 2021-05-13 MED ORDER — HYDRALAZINE HCL 20 MG/ML IJ SOLN: 10.0000 mg | INTRAMUSCULAR | Status: AC | PRN

## 2021-05-13 MED ORDER — NITROGLYCERIN 2 % TD OINT
0.5000 [in_us] | TOPICAL_OINTMENT | Freq: Once | TRANSDERMAL | Status: AC
  Administered 2021-05-13: 0.5 [in_us] via TOPICAL
  Filled 2021-05-13: qty 1

## 2021-05-13 MED ORDER — LIDOCAINE HCL (PF) 1 % IJ SOLN
INTRAMUSCULAR | Status: AC
  Filled 2021-05-13: qty 30

## 2021-05-13 MED ORDER — HEPARIN (PORCINE) IN NACL 1000-0.9 UT/500ML-% IV SOLN
INTRAVENOUS | Status: AC
  Filled 2021-05-13: qty 1000

## 2021-05-13 MED ORDER — TIZANIDINE HCL 4 MG PO TABS
4.0000 mg | ORAL_TABLET | Freq: Every day | ORAL | Status: DC
Start: 2021-05-13 — End: 2021-05-15
  Administered 2021-05-13 – 2021-05-15 (×2): 4 mg via ORAL
  Filled 2021-05-13 (×2): qty 1

## 2021-05-13 MED ORDER — SODIUM CHLORIDE 0.9 % IV SOLN: INTRAVENOUS | Status: AC

## 2021-05-13 MED ORDER — HEPARIN (PORCINE) IN NACL 2000-0.9 UNIT/L-% IV SOLN
INTRAVENOUS | Status: DC | PRN
  Administered 2021-05-13 (×2): 1000 mL

## 2021-05-13 MED ORDER — ASPIRIN 81 MG PO CHEW
81.0000 mg | CHEWABLE_TABLET | Freq: Every day | ORAL | Status: DC
  Administered 2021-05-13 – 2021-05-15 (×3): 81 mg via ORAL
  Filled 2021-05-13 (×3): qty 1

## 2021-05-13 MED ORDER — ASPIRIN 81 MG PO CHEW: 324.0000 mg | CHEWABLE_TABLET | Freq: Once | ORAL | Status: DC

## 2021-05-13 MED ORDER — FENTANYL CITRATE (PF) 100 MCG/2ML IJ SOLN
INTRAMUSCULAR | Status: AC
  Filled 2021-05-13: qty 2

## 2021-05-13 MED ORDER — MIRTAZAPINE 15 MG PO TABS
15.0000 mg | ORAL_TABLET | Freq: Every day | ORAL | Status: DC
Start: 2021-05-13 — End: 2021-05-15
  Administered 2021-05-13 – 2021-05-15 (×2): 15 mg via ORAL
  Filled 2021-05-13 (×2): qty 1

## 2021-05-13 MED ORDER — FENTANYL CITRATE (PF) 100 MCG/2ML IJ SOLN
INTRAMUSCULAR | Status: DC | PRN
  Administered 2021-05-13 (×2): 25 ug via INTRAVENOUS

## 2021-05-13 SURGICAL SUPPLY — 26 items
CATH DIAG 6FR JR4 (CATHETERS) ×1 IMPLANT
CATH DIAG 6FR PIGTAIL ANGLED (CATHETERS) ×1 IMPLANT
CATH INFINITI 6F 1M (CATHETERS) ×1 IMPLANT
CATH LAUNCHER 6FR EBU3.5 (CATHETERS) ×1 IMPLANT
CLOSURE PERCLOSE PROSTYLE (VASCULAR PRODUCTS) ×2 IMPLANT
GLIDESHEATH SLEND SS 6F .021 (SHEATH) IMPLANT
GUIDEWIRE INQWIRE 1.5J.035X260 (WIRE) IMPLANT
GUIDEWIRE PRESSURE X 175 (WIRE) ×2 IMPLANT
GUIDEWIRE VAS SION BLUE 190 (WIRE) ×1 IMPLANT
INQWIRE 1.5J .035X260CM (WIRE)
KIT ENCORE 26 ADVANTAGE (KITS) ×1 IMPLANT
KIT ESSENTIALS PG (KITS) ×1 IMPLANT
KIT HEART LEFT (KITS) ×2 IMPLANT
PACK CARDIAC CATHETERIZATION (CUSTOM PROCEDURE TRAY) ×2 IMPLANT
SHEATH PINNACLE 6F 10CM (SHEATH) ×1 IMPLANT
SHEATH PINNACLE 7F 10CM (SHEATH) ×1 IMPLANT
SHEATH PROBE COVER 6X72 (BAG) ×1 IMPLANT
SYR MEDRAD MARK 7 150ML (SYRINGE) ×2 IMPLANT
TRANSDUCER W/STOPCOCK (MISCELLANEOUS) ×2 IMPLANT
TUBING CIL FLEX 10 FLL-RA (TUBING) ×2 IMPLANT
WIRE ASAHI FIELDER FC 180CM (WIRE) ×1 IMPLANT
WIRE CHOICE GRAPHX PT 300 (WIRE) ×1 IMPLANT
WIRE EMERALD 3MM-J .035X150CM (WIRE) ×1 IMPLANT
WIRE EMERALD ST .035X260CM (WIRE) IMPLANT
WIRE HI TORQ WHISPER MS 190CM (WIRE) ×1 IMPLANT
WIRE MICRO SET SILHO 5FR 7 (SHEATH) ×1 IMPLANT

## 2021-05-13 NOTE — Progress Notes (Addendum)
Progress Note  Patient Name: [name] Date of Encounter: 05/13/2021  [facility] Cardiologist: Ida Rogue, MD   Subjective   Chest pain is now improved since starting IV nitro.   Inpatient Medications    Scheduled Meds:  carvedilol  3.125 mg Oral BID   clopidogrel  75 mg Oral Daily   ezetimibe  10 mg Oral Daily   gabapentin  100 mg Oral TID   mirtazapine  15 mg Oral QHS   pantoprazole  40 mg Oral Daily   rosuvastatin  40 mg Oral Daily   tiZANidine  4 mg Oral QHS   Continuous Infusions:  heparin 1,000 Units/hr (05/13/21 0242)   nitroGLYCERIN 10 mcg/min (05/13/21 0729)   PRN Meds: acetaminophen, ondansetron (ZOFRAN) IV   Vital Signs    Vitals:   05/13/21 0215 05/13/21 0600 05/13/21 0700 05/13/21 0715  BP: 134/78 (!) 146/80 (!) 153/75 (!) 157/71  Pulse: 68 (!) 56 (!) 56 (!) 56  Resp: 19 12 (!) 25 12  Temp:      TempSrc:      SpO2: 100% 99% 96% 96%  Weight:      Height:        Intake/Output Summary (Last 24 hours) at 05/13/2021 0748 Last data filed at 05/13/2021 0729 Gross per 24 hour  Intake 0.77 ml  Output --  Net 0.77 ml   Last 3 Weights 05/13/2021 03/17/2021 03/09/2021  Weight (lbs) 168 lb 169 lb 169 lb 4 oz  Weight (kg) 76.204 kg 76.658 kg 76.771 kg      Telemetry    SR - Personally Reviewed  ECG    SR, borderline prolonged QT - Personally Reviewed  Physical Exam   GEN: No acute distress.   Neck: No JVD Cardiac: RRR, no murmurs, rubs, or gallops.  Respiratory: Clear to auscultation bilaterally. GI: Soft, nontender, non-distended  MS: No edema; No deformity. Neuro:  Nonfocal  Psych: Normal affect   Labs    High Sensitivity Troponin:   Recent Labs  Lab 05/13/21 0102 05/13/21 0316  TROPONINIHS 206* 524*     Chemistry Recent Labs  Lab 05/13/21 0102 05/13/21 0131 05/13/21 0554  NA 132* 133* 134*  K 4.5 4.7 4.0  CL 100 101 106  CO2 23  --  24  GLUCOSE 111* 109* 111*  BUN 9 10 9   CREATININE 0.65 0.60* 0.73   CALCIUM 8.8*  --  8.6*  PROT 5.9*  --   --   ALBUMIN 3.1*  --   --   AST 38  --   --   ALT 23  --   --   ALKPHOS 33*  --   --   BILITOT 1.4*  --   --   GFRNONAA >60  --  >60  ANIONGAP 9  --  4*    Lipids No results for input(s): CHOL, TRIG, HDL, LABVLDL, LDLCALC, CHOLHDL in the last 168 hours.  Hematology Recent Labs  Lab 05/13/21 0102 05/13/21 0131 05/13/21 0554  WBC 8.7  --  9.3  RBC 4.20*  --  3.93*  HGB 12.2* 13.3 11.5*  HCT 36.8* 39.0 34.8*  MCV 87.6  --  88.5  MCH 29.0  --  29.3  MCHC 33.2  --  33.0  RDW 16.4*  --  16.3*  PLT 325  --  274   Thyroid No results for input(s): TSH, FREET4 in the last 168 hours.  BNPNo results for input(s): BNP, PROBNP in the last 168 hours.  DDimer No results for input(s): DDIMER in the last 168 hours.   Radiology    DG Chest Portable 1 View  Result Date: 05/13/2021 CLINICAL DATA:  Chestpaonetc.  Chest pain EXAM: PORTABLE CHEST 1 VIEW COMPARISON:  Chest x-ray 01/18/2020, CT chest 10/28/2017. FINDINGS: The heart and mediastinal contours are unchanged. Aortic calcification. Coronary artery stent noted. Right base linear atelectasis versus scarring. No focal consolidation. No pulmonary edema. No pleural effusion. No pneumothorax. No acute osseous abnormality. IMPRESSION: No active disease. Electronically Signed   By: Iven Finn M.D.   On: 05/13/2021 01:48    Cardiac Studies   Cath: 10/2017  There is mild to moderate left ventricular systolic dysfunction. The LVEF was 45-50% by visual estimate basal inferior -aneurysmal akinesis. LV end diastolic pressure is normal. _________________________________________________________________________________________ Colon Flattery LM lesion is 45% stenosed - just prior to stent. Previously placed Mid LM to Dist LM stent (unknown type) is widely patent - the stent continues into the pCx. Previously placed Ost Cx to Mid Cx stents (unknown type -several overlapping BMS and DES) are widely patent. Prox LAD  lesion is 100% stenosed = just after large branching SP 1 LIMA-LAD graft was visualized by angiography and is normal in caliber. The graft exhibits no disease. Ost RCA to Dist RCA lesion is 100% stenosed Seq SVG- AVM-PDA graft was not visualized due to known occlusion. SVG-OM graft was not visualized due to known occlusion.   RELATIVELY STABLE CAD FROM 2015 Known Severe Multi-vessel CAD: known CTO of ostRCA, mLAD & SVG-AM-PDA, SVG-OM. Widely patent LIMA-LAD - retograde fills to 100% CTO, antegrade flow brisk to the Apex. Prox LM ~40% (stable lesion just prior to the stent) followed by widely patent stents running from pLM-ost-proxCx-OM with brisk flow in both the distal OM & AV Groove Cx --> collaterals to RPL system. Mldly reduced LVEF with basal-mid Inferior Akinesis. Normal/Low LVEDP.     PLAN: Return to nursing unit for ongoing care and TR band removal.   Post-cath hydration.   Evaluate for noncardiac cause for chest pain.   Recommend dual antiplatelet therapy with Aspirin 81mg  daily and Clopidogrel 75mg  daily long-term (beyond 12 months) because of extensive CAD & multiple overlapping stents in LM-Cx-OM.Marland Kitchen     Glenetta Hew, M.D., M.S. Interventional Cardiologist   Diagnostic Dominance: Right   Patient Profile     71 y.o. male with a history of severe multivessel CAD s/p 3v CABG in 1995 and multiple subsequent PCIs, ischemic cardiomyopathy with mildly reduced LVEF, diffuse PAD s/p left renal artery stenting, enlarging infrarenal AAA (4.6 cm by [place] 02/2021), hypertension, dyslipidemia, remote tobacco use, and current alcohol use disorder who presented with chest pain.  Assessment & Plan    NSTEMI with hx of CAD s/p CABG with multiple subsequent PCIs: hsTn 206>>524. EKG with no acute ischemic changes. Initially chest pain resolved but then returned. Now improved with IV nitro. Will plan for cardiac cath today.  -- continue ASA, plavix, statin, BB, zetia, IV heparin/nitro  Shared  Decision Making/Informed Consent The risks [stroke (1 in 1000), death (1 in 29), kidney failure [usually temporary] (1 in 500), bleeding (1 in 200), allergic reaction [possibly serious] (1 in 200)], benefits (diagnostic support and management of coronary artery disease) and alternatives of a cardiac catheterization were discussed in detail with Mr. Sudbeck and he is willing to proceed.   ICM: hx of mildly reduced EF, noted at 50-55%. G1DD on echo from 02/2021. No signs of volume overload on exam  PAD: hx of  abdominal aortic aneurysm 4.6 cm, which increased from 4.2 cm from July 2021 study.    HTN: stable  -- continue on coreg 3.125mg  BID  HLD: on crestor 40mg  daily, Zetia  ETOH use    For questions or updates, please contact [facility] Please consult [email or web address] for contact info under        Signed, Reino Bellis, NP  05/13/2021, 7:48 AM     Agree with note by Reino Bellis NP-C  71 year old Caucasian male patient of Dr. Donivan Scull with previous history of remote CABG and multiple stents since that time.  His other problems include treated hyperlipidemia, hypertension, and PAD.  He does have ischemic cardiomyopathy with mildly reduced EF.  He developed chest pain last night which he attributed to indigestion and became more anginal and was seen in the emergency room.  His enzymes were positive.  EKG shows no acute changes.  He is on IV heparin.  He scheduled for diagnostic coronary angiography this morning.  His last cath in 2019 by Dr. Ellyn Hack revealed an occluded native right and right graft, occluded LAD with a patent LIMA, occluded vein to an obtuse marginal branch with stents that began in the left main extending into the proximal circumflex and proximal OM1.  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  Lorretta Harp, M.D., [place], [facility], [place] [facility] [address] [place], [place]  [address]  [phone] 05/13/2021 10:21 AM

## 2021-05-13 NOTE — H&P (View-Only) (Signed)
Progress Note  Patient Name: [name] Date of Encounter: 05/13/2021  [facility] Cardiologist: Ida Rogue, MD   Subjective   Chest pain is now improved since starting IV nitro.   Inpatient Medications    Scheduled Meds:  carvedilol  3.125 mg Oral BID   clopidogrel  75 mg Oral Daily   ezetimibe  10 mg Oral Daily   gabapentin  100 mg Oral TID   mirtazapine  15 mg Oral QHS   pantoprazole  40 mg Oral Daily   rosuvastatin  40 mg Oral Daily   tiZANidine  4 mg Oral QHS   Continuous Infusions:  heparin 1,000 Units/hr (05/13/21 0242)   nitroGLYCERIN 10 mcg/min (05/13/21 0729)   PRN Meds: acetaminophen, ondansetron (ZOFRAN) IV   Vital Signs    Vitals:   05/13/21 0215 05/13/21 0600 05/13/21 0700 05/13/21 0715  BP: 134/78 (!) 146/80 (!) 153/75 (!) 157/71  Pulse: 68 (!) 56 (!) 56 (!) 56  Resp: 19 12 (!) 25 12  Temp:      TempSrc:      SpO2: 100% 99% 96% 96%  Weight:      Height:        Intake/Output Summary (Last 24 hours) at 05/13/2021 0748 Last data filed at 05/13/2021 0729 Gross per 24 hour  Intake 0.77 ml  Output --  Net 0.77 ml   Last 3 Weights 05/13/2021 03/17/2021 03/09/2021  Weight (lbs) 168 lb 169 lb 169 lb 4 oz  Weight (kg) 76.204 kg 76.658 kg 76.771 kg      Telemetry    SR - Personally Reviewed  ECG    SR, borderline prolonged QT - Personally Reviewed  Physical Exam   GEN: No acute distress.   Neck: No JVD Cardiac: RRR, no murmurs, rubs, or gallops.  Respiratory: Clear to auscultation bilaterally. GI: Soft, nontender, non-distended  MS: No edema; No deformity. Neuro:  Nonfocal  Psych: Normal affect   Labs    High Sensitivity Troponin:   Recent Labs  Lab 05/13/21 0102 05/13/21 0316  TROPONINIHS 206* 524*     Chemistry Recent Labs  Lab 05/13/21 0102 05/13/21 0131 05/13/21 0554  NA 132* 133* 134*  K 4.5 4.7 4.0  CL 100 101 106  CO2 23  --  24  GLUCOSE 111* 109* 111*  BUN 9 10 9   CREATININE 0.65 0.60* 0.73   CALCIUM 8.8*  --  8.6*  PROT 5.9*  --   --   ALBUMIN 3.1*  --   --   AST 38  --   --   ALT 23  --   --   ALKPHOS 33*  --   --   BILITOT 1.4*  --   --   GFRNONAA >60  --  >60  ANIONGAP 9  --  4*    Lipids No results for input(s): CHOL, TRIG, HDL, LABVLDL, LDLCALC, CHOLHDL in the last 168 hours.  Hematology Recent Labs  Lab 05/13/21 0102 05/13/21 0131 05/13/21 0554  WBC 8.7  --  9.3  RBC 4.20*  --  3.93*  HGB 12.2* 13.3 11.5*  HCT 36.8* 39.0 34.8*  MCV 87.6  --  88.5  MCH 29.0  --  29.3  MCHC 33.2  --  33.0  RDW 16.4*  --  16.3*  PLT 325  --  274   Thyroid No results for input(s): TSH, FREET4 in the last 168 hours.  BNPNo results for input(s): BNP, PROBNP in the last 168 hours.  DDimer No results for input(s): DDIMER in the last 168 hours.   Radiology    DG Chest Portable 1 View  Result Date: 05/13/2021 CLINICAL DATA:  Chestpaonetc.  Chest pain EXAM: PORTABLE CHEST 1 VIEW COMPARISON:  Chest x-ray 01/18/2020, CT chest 10/28/2017. FINDINGS: The heart and mediastinal contours are unchanged. Aortic calcification. Coronary artery stent noted. Right base linear atelectasis versus scarring. No focal consolidation. No pulmonary edema. No pleural effusion. No pneumothorax. No acute osseous abnormality. IMPRESSION: No active disease. Electronically Signed   By: Iven Finn M.D.   On: 05/13/2021 01:48    Cardiac Studies   Cath: 10/2017  There is mild to moderate left ventricular systolic dysfunction. The LVEF was 45-50% by visual estimate basal inferior -aneurysmal akinesis. LV end diastolic pressure is normal. _________________________________________________________________________________________ Colon Flattery LM lesion is 45% stenosed - just prior to stent. Previously placed Mid LM to Dist LM stent (unknown type) is widely patent - the stent continues into the pCx. Previously placed Ost Cx to Mid Cx stents (unknown type -several overlapping BMS and DES) are widely patent. Prox LAD  lesion is 100% stenosed = just after large branching SP 1 LIMA-LAD graft was visualized by angiography and is normal in caliber. The graft exhibits no disease. Ost RCA to Dist RCA lesion is 100% stenosed Seq SVG- AVM-PDA graft was not visualized due to known occlusion. SVG-OM graft was not visualized due to known occlusion.   RELATIVELY STABLE CAD FROM 2015 Known Severe Multi-vessel CAD: known CTO of ostRCA, mLAD & SVG-AM-PDA, SVG-OM. Widely patent LIMA-LAD - retograde fills to 100% CTO, antegrade flow brisk to the Apex. Prox LM ~40% (stable lesion just prior to the stent) followed by widely patent stents running from pLM-ost-proxCx-OM with brisk flow in both the distal OM & AV Groove Cx --> collaterals to RPL system. Mldly reduced LVEF with basal-mid Inferior Akinesis. Normal/Low LVEDP.     PLAN: Return to nursing unit for ongoing care and TR band removal.   Post-cath hydration.   Evaluate for noncardiac cause for chest pain.   Recommend dual antiplatelet therapy with Aspirin 81mg  daily and Clopidogrel 75mg  daily long-term (beyond 12 months) because of extensive CAD & multiple overlapping stents in LM-Cx-OM.Marland Kitchen     Glenetta Hew, M.D., M.S. Interventional Cardiologist   Diagnostic Dominance: Right   Patient Profile     71 y.o. male with a history of severe multivessel CAD s/p 3v CABG in 1995 and multiple subsequent PCIs, ischemic cardiomyopathy with mildly reduced LVEF, diffuse PAD s/p left renal artery stenting, enlarging infrarenal AAA (4.6 cm by [place] 02/2021), hypertension, dyslipidemia, remote tobacco use, and current alcohol use disorder who presented with chest pain.  Assessment & Plan    NSTEMI with hx of CAD s/p CABG with multiple subsequent PCIs: hsTn 206>>524. EKG with no acute ischemic changes. Initially chest pain resolved but then returned. Now improved with IV nitro. Will plan for cardiac cath today.  -- continue ASA, plavix, statin, BB, zetia, IV heparin/nitro  Shared  Decision Making/Informed Consent The risks [stroke (1 in 1000), death (1 in 63), kidney failure [usually temporary] (1 in 500), bleeding (1 in 200), allergic reaction [possibly serious] (1 in 200)], benefits (diagnostic support and management of coronary artery disease) and alternatives of a cardiac catheterization were discussed in detail with [name] and he is willing to proceed.   ICM: hx of mildly reduced EF, noted at 50-55%. G1DD on echo from 02/2021. No signs of volume overload on exam  PAD: hx of  abdominal aortic aneurysm 4.6 cm, which increased from 4.2 cm from July 2021 study.    HTN: stable  -- continue on coreg 3.125mg  BID  HLD: on crestor 40mg  daily, Zetia  ETOH use    For questions or updates, please contact [facility] Please consult [email or web address] for contact info under        Signed, Reino Bellis, NP  05/13/2021, 7:48 AM     Agree with note by Reino Bellis NP-C  71 year old Caucasian male patient of Dr. Donivan Scull with previous history of remote CABG and multiple stents since that time.  His other problems include treated hyperlipidemia, hypertension, and PAD.  He does have ischemic cardiomyopathy with mildly reduced EF.  He developed chest pain last night which he attributed to indigestion and became more anginal and was seen in the emergency room.  His enzymes were positive.  EKG shows no acute changes.  He is on IV heparin.  He scheduled for diagnostic coronary angiography this morning.  His last cath in 2019 by Dr. Ellyn Hack revealed an occluded native right and right graft, occluded LAD with a patent LIMA, occluded vein to an obtuse marginal branch with stents that began in the left main extending into the proximal circumflex and proximal OM1.  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  Lorretta Harp, M.D., [place], [facility], [place] [address] [place], [place]  [address]  [phone] 05/13/2021 10:21 AM

## 2021-05-13 NOTE — Interval H&P Note (Signed)
History and Physical Interval Note:  05/13/2021 11:42 AM  [name]  has presented today for surgery, with the diagnosis of chest pain.  The various methods of treatment have been discussed with the patient and family. After consideration of risks, benefits and other options for treatment, the patient has consented to  Procedure(s): LEFT HEART CATH AND CORONARY ANGIOGRAPHY (N/A) as a surgical intervention.  The patient's history has been reviewed, patient examined, no change in status, stable for surgery.  I have reviewed the patient's chart and labs.  Questions were answered to the patient's satisfaction.    Cath Lab Visit (complete for each Cath Lab visit)  Clinical Evaluation Leading to the Procedure:   ACS: Yes.    Non-ACS:    Anginal Classification: CCS IV  Anti-ischemic medical therapy: Maximal Therapy (2 or more classes of medications)  Non-Invasive Test Results: No non-invasive testing performed  Prior CABG: Previous CABG        Early [place]

## 2021-05-13 NOTE — Progress Notes (Signed)
ANTICOAGULATION CONSULT NOTE   Pharmacy Consult for Heparin  Indication: chest pain/ACS  Allergies  Allergen Reactions   Metoprolol Tartrate Hives and Itching   Quinolones Other (See Comments)    Contraindicated with AAA   Metoclopramide Nausea Only    Other reaction(s): Unknown    Patient Measurements: Height: 5\' 4"  (162.6 cm) Weight: 76.2 kg (168 lb) IBW/kg (Calculated) : 59.2  Vital Signs: Temp: 98.3 F (36.8 C) (01/18 1021) Temp Source: Oral (01/18 1021) BP: 145/93 (01/18 1115) Pulse Rate: 66 (01/18 1115)  Labs: Recent Labs    05/13/21 0102 05/13/21 0131 05/13/21 0316 05/13/21 0554 05/13/21 0847 05/13/21 1059  HGB 12.2* 13.3  --  11.5*  --   --   HCT 36.8* 39.0  --  34.8*  --   --   PLT 325  --   --  274  --   --   HEPARINUNFRC  --   --   --   --   --  0.40  CREATININE 0.65 0.60*  --  0.73  --   --   TROPONINIHS 206*  --  524*  --  1,256*  --      Estimated Creatinine Clearance: 80.2 mL/min (by C-G formula based on SCr of 0.73 mg/dL).   Medical History: Past Medical History:  Diagnosis Date   AAA (abdominal aortic aneurysm)    a. Duplex 01/2012: stable infrarenal saccular AAA at 3.3cm x 3.2xm (f/u recommended 01/2013 per Dr. Rockey Situ)   Alcohol abuse    CAD (coronary artery disease)    a. 1995 s/p CABG x 4: LIMA->LAD, VG->RI (known to be occluded), VG->AM->PDA;  b. 05/2002 Inf STEMI: VG->AM->PDA 100% treated w/ 2 BMS complicated by acute thrombosis req 3 BMS;  c. 07/2003 DES to  LAD & LCX (VG's to PDA & RI 100%);  d. 12/2010 Acute MI ([place]): DES to LCX & LM , LIMA ok,;  e.12/2011 Cath: LM/LCX stents ok , LIMA patent.   Cardiomyopathy ([place])    a. EF 35% by cath 2015.   Diverticulitis    1/06 Diverticulitis--CT of pelvis--diffuse sigmoid divertic   Dyspnea    GERD (gastroesophageal reflux disease)    Hyperlipidemia    Hypertension    Myocardial infarction [facility])    PAD (peripheral artery disease) ([place])    a. external iliac and mesenteric stenosis noted by  noninvasive imaging.   Renal artery stenosis ([place])    a. 03/2011 PTA and stenting of L RA. b. last duplex 2016 with stable 1-59% bilateral RAS, incidental >50% R EIA stenosis    Assessment: 71 y/o M with chest pain and elevated troponin. Staring heparin. CBC/renal function ok. PTA meds reviewed.   Initial heparin level at goal on 1000 units/hr. No bleeding noted. For cath today.   Goal of Therapy:  Heparin level 0.3-0.7 units/ml Monitor platelets by anticoagulation protocol: Yes   Plan:  Heparin at 1000 units/hr Daily CBC/Heparin level Monitor for bleeding  Erin Hearing PharmD., BCPS Clinical Pharmacist 05/13/2021 1:27 PM

## 2021-05-13 NOTE — H&P (Signed)
Cardiology History & Physical    Patient ID: [name] MRN: [id number], DOB/AGE: June 24, 1950   Admit date: 05/13/2021  Primary Physician: Venia Carbon, MD Primary Cardiologist: None  Patient Profile    [name] is a 71 y.o. male with a history of severe multivessel CAD s/p 3v CABG in 1995 and multiple subsequent PCIs from 2004 to 2012, beginning with an inferior STEMI in 2004 (unclear intervention). His most recent angiography in 2019 showed known CTOs of the [place], [employer], SVG-AM-PDA, and SVG-OM, widely patent LIMA-LAD with retograde fills to the CTO with antegrade flow brisk to the Apex, and 40% proximal LM stenosis (stable lesion just prior to a stent) followed by widely patent stents running from pLM into LCx-OM with brisk distal flow, and collaterals to RPL system. He also has a history of ischemic cardiomyopathy with mildly reduced LVEF, diffuse PAD s/p left renal artery stenting, enlarging infrarenal AAA (4.6 cm by [place] 02/2021), hypertension, dyslipidemia, remote tobacco use, and current alcohol use disorder.   Presented to the ED overnight with acute onset chest pain with elevated troponin.   History of Present Illness    He has been active, currently working on home renovations. He recalls feeling a little "off" this afternoon and stopped his work at 4:30pm today. He had a sandwich for lunch with a spicy sauce and experienced what he thought was heartburn after eating. However this progressed to a more familiar anginal pain in his left chest with radiation to his left arm for a few minutes into the jaw. No improvement with antacids. He was laying down this evening when the pain started worsening so he took 2 nitroglycerins which helped but not completely resolved the pain. It was non pleuritic. Denies associated sob, orthopnea, diaphoresis. Has been feeling a little more dyspneic with activity over the last 6 months though. No leg swelling or abdominal tightness that he  can tell. Took 324mg  aspirin pta. On arrival, vitals overall wnl. Troponin 206->524. ECG showed old inferior infarct and borderline nonspecific t-wave changes, but overall reassuring. CP resolved after fentanyl and additional nitro in the ED and he has been started on heparin.  Past Medical History   Past Medical History:  Diagnosis Date   AAA (abdominal aortic aneurysm)    a. Duplex 01/2012: stable infrarenal saccular AAA at 3.3cm x 3.2xm (f/u recommended 01/2013 per Dr. Rockey Situ)   Alcohol abuse    CAD (coronary artery disease)    a. 1995 s/p CABG x 4: LIMA->LAD, VG->RI (known to be occluded), VG->AM->PDA;  b. 05/2002 Inf STEMI: VG->AM->PDA 100% treated w/ 2 BMS complicated by acute thrombosis req 3 BMS;  c. 07/2003 DES to  LAD & LCX (VG's to PDA & RI 100%);  d. 12/2010 Acute MI ([place]): DES to LCX & LM , LIMA ok,;  e.12/2011 Cath: LM/LCX stents ok , LIMA patent.   Cardiomyopathy ([place])    a. EF 35% by cath 2015.   Diverticulitis    1/06 Diverticulitis--CT of pelvis--diffuse sigmoid divertic   Dyspnea    GERD (gastroesophageal reflux disease)    Hyperlipidemia    Hypertension    Myocardial infarction [facility])    PAD (peripheral artery disease) ([place])    a. external iliac and mesenteric stenosis noted by noninvasive imaging.   Renal artery stenosis ([place])    a. 03/2011 PTA and stenting of L RA. b. last duplex 2016 with stable 1-59% bilateral RAS, incidental >50% R EIA stenosis    Past Surgical History:  Procedure Laterality Date   ANGIOPLASTY  1997   CARDIAC CATHETERIZATION     8/09  Cath--vein graft occlusions which are old--no acute changes   CARDIAC CATHETERIZATION  11/13/2010   stent x 2 @ [place]   CARDIAC CATHETERIZATION  04/05/2014   stent placement    CARDIAC CATHETERIZATION  10/28/2017   CLOSED REDUCTION SHOULDER DISLOCATION     COLONOSCOPY WITH PROPOFOL N/A 12/22/2018   Procedure: COLONOSCOPY WITH PROPOFOL;  Surgeon: Jonathon Bellows, MD;  Location: [facility] ENDOSCOPY;  Service:  Gastroenterology;  Laterality: N/A;   CORONARY ANGIOPLASTY  04/05/2014   stent placement OM 1   CORONARY ARTERY BYPASS GRAFT     CORONARY STENT PLACEMENT  7/12   2 stents--Promus element plus (everolimus eluting)--[facility] in [place] CATH AND CORS/GRAFTS ANGIOGRAPHY N/A 10/28/2017   Procedure: LEFT HEART CATH AND CORS/GRAFTS ANGIOGRAPHY;  Surgeon: Leonie Man, MD;  Location: [facility] CV LAB;  Service: Cardiovascular;  Laterality: N/A;   LEFT HEART CATHETERIZATION WITH CORONARY/GRAFT ANGIOGRAM N/A 01/04/2012   Procedure: LEFT HEART CATHETERIZATION WITH Beatrix Fetters;  Surgeon: Burnell Blanks, MD;  Location: [facility] CATH LAB;  Service: Cardiovascular;  Laterality: N/A;   LEFT HEART CATHETERIZATION WITH CORONARY/GRAFT ANGIOGRAM N/A 04/05/2014   Procedure: LEFT HEART CATHETERIZATION WITH Beatrix Fetters;  Surgeon: Jettie Booze, MD;  Location: [facility] CATH LAB;  Service: Cardiovascular;  Laterality: N/A;   PERCUTANEOUS CORONARY STENT INTERVENTION (PCI-S)  04/05/2014   Procedure: PERCUTANEOUS CORONARY STENT INTERVENTION (PCI-S);  Surgeon: Jettie Booze, MD;  Location: [facility] CATH LAB;  Service: Cardiovascular;;  OM1   RENAL ANGIOGRAM N/A 04/16/2011   Procedure: RENAL ANGIOGRAM;  Surgeon: Burnell Blanks, MD;  Location: [facility] CATH LAB;  Service: Cardiovascular;  Laterality: N/A;   RENAL ARTERY STENT  03/2011 ?     Allergies Allergies  Allergen Reactions   Metoprolol Tartrate Hives and Itching   Quinolones Other (See Comments)    Contraindicated with AAA   Metoclopramide Nausea Only    Other reaction(s): Unknown    Home Medications    Prior to Admission medications   Medication Sig Start Date End Date Taking? Authorizing Provider  aspirin 81 MG tablet Take 81 mg by mouth daily.   Yes [provider]  BLACK ELDERBERRY PO Take 1 each by mouth daily.   Yes [provider]  carvedilol (COREG) 3.125 MG tablet TAKE 1  TABLET (3.125 MG TOTAL) BY MOUTH 2 (TWO) TIMES DAILY. Patient taking differently: Take 3.125 mg by mouth 2 (two) times daily. 04/29/21  Yes Gollan, Kathlene November, MD  clopidogrel (PLAVIX) 75 MG tablet TAKE 1 TABLET (75 MG TOTAL) BY MOUTH DAILY. 09/02/20  Yes Gollan, Kathlene November, MD  ezetimibe (ZETIA) 10 MG tablet TAKE 1 TABLET (10 MG TOTAL) BY MOUTH DAILY. 04/13/21  Yes Gollan, Kathlene November, MD  gabapentin (NEURONTIN) 100 MG capsule Take 100 mg by mouth 3 (three) times daily. 04/03/21  Yes [provider]  isosorbide mononitrate (IMDUR) 30 MG 24 hr tablet TAKE ONE a day Patient taking differently: Take 30 mg by mouth daily. 03/09/21  Yes Gollan, Kathlene November, MD  meclizine (ANTIVERT) 25 MG tablet TAKE 1 TABLET BY MOUTH 3 TIMES DAILY AS NEEDED FOR DIZZINESS. Patient taking differently: Take 25 mg by mouth 3 (three) times daily as needed for dizziness or nausea. 11/10/20  Yes Venia Carbon, MD  mirtazapine (REMERON) 15 MG tablet TAKE 1 TABLET BY MOUTH EVERYDAY AT BEDTIME Patient taking differently: Take 15 mg  by mouth at bedtime. TAKE 1 TABLET BY MOUTH EVERYDAY AT BEDTIME 04/06/21  Yes Venia Carbon, MD  Multiple Vitamins-Minerals (MULTIVITAMIN WITH MINERALS) tablet Take 1 tablet by mouth daily.   Yes [provider]  nitroGLYCERIN (NITROSTAT) 0.4 MG SL tablet Place 1 tablet (0.4 mg total) under the tongue every 5 (five) minutes as needed for chest pain. 02/16/19  Yes Venia Carbon, MD  omeprazole (PRILOSEC) 20 MG capsule Take 20 mg by mouth daily.   Yes [provider]  rosuvastatin (CRESTOR) 40 MG tablet Take 1 tablet (40 mg total) by mouth daily. 08/15/20  Yes Gollan, Kathlene November, MD  tiZANidine (ZANAFLEX) 4 MG tablet Take 4 mg by mouth at bedtime.   Yes [provider]  traMADol (ULTRAM) 50 MG tablet as needed. Patient not taking: Reported on 05/13/2021    [provider]    Family History    Family History  Problem Relation Age of Onset   Coronary artery  disease Mother        Died MI age 23   Hypertension Mother    Heart attack Mother    Coronary artery disease Sister        Living   Coronary artery disease Brother        Living   Coronary artery disease Father        Died MI age 67   Heart attack Father    Heart attack Maternal Grandfather    Diabetes Neg Hx    Cancer Neg Hx        prostate or colon   He indicated that his mother is deceased. He indicated that his father is deceased. He indicated that his sister is alive. He indicated that his brother is alive. He indicated that his maternal grandfather is deceased. He indicated that the status of his neg hx is unknown.   Social History    Social History   Socioeconomic History   Marital status: Married    Spouse name: Not on file   Number of children: 3   Years of education: Not on file   Highest education level: Not on file  Occupational History   Occupation: Geneticist, molecular (Animal nutritionist)    Comment: Retired  Tobacco Use   Smoking status: Former    Packs/day: 3.00    Years: 25.00    Pack years: 75.00    Types: Cigarettes    Quit date: 05/28/1993    Years since quitting: 27.9   Smokeless tobacco: Never  Vaping Use   Vaping Use: Never used  Substance and Sexual Activity   Alcohol use: Yes    Alcohol/week: 42.0 standard drinks    Types: 42 Cans of beer per week   Drug use: No   Sexual activity: Not on file  Other Topics Concern   Not on file  Social History Narrative   No living will   No health care POA but requests wife--then children   Would accept resuscitation   Not sure about tube feeds   Social Determinants of Health   Financial Resource Strain: Not on file  Food Insecurity: Not on file  Transportation Needs: Not on file  Physical Activity: Not on file  Stress: Not on file  Social Connections: Not on file  Intimate Partner Violence: Not on file     Review of Systems    A comprehensive ROS was performed with pertinent positives and  negatives noted in the HPI.   Physical Exam  BP 134/78    Pulse 68    Temp 98.5 F (36.9 C) (Oral)    Resp 19    Ht 5\' 4"  (1.626 m)    Wt 76.2 kg    SpO2 100%    BMI 28.84 kg/m  General: Alert, NAD HEENT: Normal Neck: No bruits or JVD. Lungs:  Resp regular and unlabored, CTA bilaterally. Heart: Regular rhythm, no s3, s4, or murmurs. Abdomen: Soft, non-tender, non-distended, BS +.  Extremities: Warm. No clubbing, cyanosis. Trace edema. Left radial pulse absent (reports prior complication with left radial access in which his arm swelled). Neuro: Alert and oriented. No gross focal deficits. No abnormal movements.  Labs    Recent Labs    05/13/21 0102 05/13/21 0316  TROPONINIHS 206* 524*    Lab Results  Component Value Date   WBC 8.7 05/13/2021   HGB 13.3 05/13/2021   HCT 39.0 05/13/2021   MCV 87.6 05/13/2021   PLT 325 05/13/2021    Recent Labs  Lab 05/13/21 0102 05/13/21 0131  NA 132* 133*  K 4.5 4.7  CL 100 101  CO2 23  --   BUN 9 10  CREATININE 0.65 0.60*  CALCIUM 8.8*  --   PROT 5.9*  --   BILITOT 1.4*  --   ALKPHOS 33*  --   ALT 23  --   AST 38  --   GLUCOSE 111* 109*   Lab Results  Component Value Date   CHOL 138 02/20/2021   HDL 52.20 02/20/2021   LDLCALC 24 10/29/2017   TRIG 206.0 (H) 02/20/2021   Lab Results  Component Value Date   DDIMER 1.90 (H) 10/29/2017     Radiology Studies    DG Chest Portable 1 View  Result Date: 05/13/2021 CLINICAL DATA:  Chestpaonetc.  Chest pain EXAM: PORTABLE CHEST 1 VIEW COMPARISON:  Chest x-ray 01/18/2020, CT chest 10/28/2017. FINDINGS: The heart and mediastinal contours are unchanged. Aortic calcification. Coronary artery stent noted. Right base linear atelectasis versus scarring. No focal consolidation. No pulmonary edema. No pleural effusion. No pneumothorax. No acute osseous abnormality. IMPRESSION: No active disease. Electronically Signed   By: Iven Finn M.D.   On: 05/13/2021 01:48    ECG & Cardiac  Imaging    ECG: NSR, old inferior infarct, borderline inferior TW changes - personally reviewed.  Echo reviewed  2. Left ventricular ejection fraction, by estimation, is 50 to 55%. The  left ventricle has low normal function. The left ventricle demonstrates  regional wall motion abnormalities (Basal to mid inferior and basal  inferolateral wall hypokinesis). Left  ventricular diastolic parameters are consistent with Grade I diastolic  dysfunction (impaired relaxation).   3. Right ventricular systolic function is normal. The right ventricular  size is normal.    Cardiac cath 10/25/2017 Known Severe Multi-vessel CAD: known CTO of ostRCA, mLAD & SVG-AM-PDA, SVG-OM. Widely patent LIMA-LAD - retograde fills to 100% CTO, antegrade flow brisk to the Apex. Prox LM ~40% (stable lesion just prior to the stent) followed by widely patent stents running from pLM-ost-proxCx-OM with brisk flow in both the distal OM & AV Groove Cx --> collaterals to RPL system. Mldly reduced LVEF with basal-mid Inferior Akinesis. Normal/Low LVEDP.      04/05/2014,  catheterization. This showed an occluded OM vessel with DES placed 2 Occluded mid LAD with a patent LIMA to the LAD, patent left circumflex, prior bare-metal stent into the OM1 was occluded in the distal portion just after the ostium of OM1. Occluded  RCA, ejection fraction 35%   catheterization performed 12/2011 This showed patent stents to the left circumflex and LAD with patent LIMA to the LAD. Vein graft to the OM and vein graft to the RCA was occluded    Assessment & Plan    NSTEMI Severe multivessel CAD s/p remote 3v CABG and multiple subsequent PCIs as detailed above. Inferior STEMI in 2004, intervention unclear but apparently complicated by acute stent thrombosis. Ischemic cardiomyopathy with mildly reduced LVEF, compensated Diffuse PAD s/p left renal artery stenting and infrarenal AAA (4.6 cm) Co-morbid hypertension, dyslipidemia, remote tobacco  use, and current alcohol use disorder.  Presents with typical story and elevated biomarkers for NSTEMI. Despite his complex coronary history, may certainly have new lesion amenable to intervention. He is currently stable and chest pain free. TIMI Risk Score NSTEMI is 6, which indicates a 41% risk of all cause mortality, new or recurrent myocardial infarction or need for urgent revascularization in the next 14 days.{  - NPO for coronary angiography - Continue heparin infusion - Continue DAPT and statin/Zetia - Continue home Imdur and metoprolol.  - TTE in AM. Update hgb A1c. Last LDL 65 (02/20/21)  Nutrition: NPO DVT ppx: therapeutic heparin Advanced Care Planning: Full Code   Signed, Marykay Lex, MD 05/13/2021, 5:41 AM

## 2021-05-13 NOTE — Progress Notes (Signed)
ANTICOAGULATION CONSULT NOTE - Initial Consult  Pharmacy Consult for Heparin  Indication: chest pain/ACS  Allergies  Allergen Reactions   Metoprolol Tartrate Hives and Itching   Quinolones Other (See Comments)    Contraindicated with AAA   Metoclopramide Nausea Only    Other reaction(s): Unknown    Patient Measurements: Height: 5\' 4"  (162.6 cm) Weight: 76.2 kg (168 lb) IBW/kg (Calculated) : 59.2  Vital Signs: Temp: 98.5 F (36.9 C) (01/18 0105) Temp Source: Oral (01/18 0105) BP: 134/78 (01/18 0215) Pulse Rate: 68 (01/18 0215)  Labs: Recent Labs    05/13/21 0102 05/13/21 0131 05/13/21 0316  HGB 12.2* 13.3  --   HCT 36.8* 39.0  --   PLT 325  --   --   CREATININE 0.65 0.60*  --   TROPONINIHS 206*  --  524*    Estimated Creatinine Clearance: 80.2 mL/min (A) (by C-G formula based on SCr of 0.6 mg/dL (L)).   Medical History: Past Medical History:  Diagnosis Date   AAA (abdominal aortic aneurysm)    a. Duplex 01/2012: stable infrarenal saccular AAA at 3.3cm x 3.2xm (f/u recommended 01/2013 per Dr. Rockey Situ)   Alcohol abuse    CAD (coronary artery disease)    a. 1995 s/p CABG x 4: LIMA->LAD, VG->RI (known to be occluded), VG->AM->PDA;  b. 05/2002 Inf STEMI: VG->AM->PDA 100% treated w/ 2 BMS complicated by acute thrombosis req 3 BMS;  c. 07/2003 DES to  LAD & LCX (VG's to PDA & RI 100%);  d. 12/2010 Acute MI ([place]): DES to LCX & LM , LIMA ok,;  e.12/2011 Cath: LM/LCX stents ok , LIMA patent.   Cardiomyopathy ([place])    a. EF 35% by cath 2015.   Diverticulitis    1/06 Diverticulitis--CT of pelvis--diffuse sigmoid divertic   Dyspnea    GERD (gastroesophageal reflux disease)    Hyperlipidemia    Hypertension    Myocardial infarction [facility])    PAD (peripheral artery disease) ([place])    a. external iliac and mesenteric stenosis noted by noninvasive imaging.   Renal artery stenosis ([place])    a. 03/2011 PTA and stenting of L RA. b. last duplex 2016 with stable 1-59% bilateral RAS,  incidental >50% R EIA stenosis    Assessment: 71 y/o M with chest pain and elevated troponin. Staring heparin. CBC/renal function ok. PTA meds reviewed.   Goal of Therapy:  Heparin level 0.3-0.7 units/ml Monitor platelets by anticoagulation protocol: Yes   Plan:  Heparin 4000 units BOLUS Start heparin drip at 1000 units/hr 1100 Heparin level Daily CBC/Heparin level Monitor for bleeding  Narda Bonds, PharmD, BCPS Clinical Pharmacist Phone: [phone]

## 2021-05-13 NOTE — ED Provider Notes (Signed)
[facility] EMERGENCY DEPARTMENT Provider Note   CSN: [id number] Arrival date & time: 05/13/21  0051     History  Chief Complaint  Patient presents with   Chest Pain    [name] is a 71 y.o. male.  The history is provided by the patient.  Chest Pain Pain location:  Substernal area Pain quality: pressure   Pain radiates to:  R jaw and L arm Pain severity:  Moderate Onset quality:  Gradual Duration:  8 hours Timing:  Intermittent Progression:  Worsening Chronicity:  Recurrent Context: at rest   Relieved by:  Nothing Worsened by:  Nothing Ineffective treatments:  Aspirin Associated symptoms: no abdominal pain, no palpitations and no syncope   Risk factors: coronary artery disease       Home Medications Prior to Admission medications   Medication Sig Start Date End Date Taking? Authorizing Provider  aspirin 81 MG tablet Take 81 mg by mouth daily.   Yes [provider]  BLACK ELDERBERRY PO Take 1 each by mouth daily.   Yes [provider]  carvedilol (COREG) 3.125 MG tablet TAKE 1 TABLET (3.125 MG TOTAL) BY MOUTH 2 (TWO) TIMES DAILY. Patient taking differently: Take 3.125 mg by mouth 2 (two) times daily. 04/29/21  Yes Gollan, Kathlene November, MD  clopidogrel (PLAVIX) 75 MG tablet TAKE 1 TABLET (75 MG TOTAL) BY MOUTH DAILY. 09/02/20  Yes Gollan, Kathlene November, MD  ezetimibe (ZETIA) 10 MG tablet TAKE 1 TABLET (10 MG TOTAL) BY MOUTH DAILY. 04/13/21  Yes Gollan, Kathlene November, MD  gabapentin (NEURONTIN) 100 MG capsule Take 100 mg by mouth 3 (three) times daily. 04/03/21  Yes [provider]  isosorbide mononitrate (IMDUR) 30 MG 24 hr tablet TAKE ONE a day Patient taking differently: Take 30 mg by mouth daily. 03/09/21  Yes Gollan, Kathlene November, MD  meclizine (ANTIVERT) 25 MG tablet TAKE 1 TABLET BY MOUTH 3 TIMES DAILY AS NEEDED FOR DIZZINESS. Patient taking differently: Take 25 mg by mouth 3 (three) times daily as needed for dizziness or  nausea. 11/10/20  Yes Venia Carbon, MD  mirtazapine (REMERON) 15 MG tablet TAKE 1 TABLET BY MOUTH EVERYDAY AT BEDTIME Patient taking differently: Take 15 mg by mouth at bedtime. TAKE 1 TABLET BY MOUTH EVERYDAY AT BEDTIME 04/06/21  Yes Venia Carbon, MD  Multiple Vitamins-Minerals (MULTIVITAMIN WITH MINERALS) tablet Take 1 tablet by mouth daily.   Yes [provider]  nitroGLYCERIN (NITROSTAT) 0.4 MG SL tablet Place 1 tablet (0.4 mg total) under the tongue every 5 (five) minutes as needed for chest pain. 02/16/19  Yes Venia Carbon, MD  omeprazole (PRILOSEC) 20 MG capsule Take 20 mg by mouth daily.   Yes [provider]  rosuvastatin (CRESTOR) 40 MG tablet Take 1 tablet (40 mg total) by mouth daily. 08/15/20  Yes Gollan, Kathlene November, MD  tiZANidine (ZANAFLEX) 4 MG tablet Take 4 mg by mouth at bedtime.   Yes [provider]  traMADol (ULTRAM) 50 MG tablet as needed. Patient not taking: Reported on 05/13/2021    [provider]      Allergies    Metoprolol tartrate, Quinolones, and Metoclopramide    Review of Systems   Review of Systems  Cardiovascular:  Positive for chest pain. Negative for palpitations and syncope.  Gastrointestinal:  Negative for abdominal pain.   Physical Exam Updated Vital Signs BP 134/78    Pulse 68    Temp 98.5 F (36.9 C) (Oral)  Resp 19    Ht 5\' 4"  (1.626 m)    Wt 76.2 kg    SpO2 100%    BMI 28.84 kg/m  Physical Exam  ED Results / Procedures / Treatments   Labs (all labs ordered are listed, but only abnormal results are displayed) Labs Reviewed  CBC WITH DIFFERENTIAL/PLATELET - Abnormal; Notable for the following components:      Result Value   RBC 4.20 (*)    Hemoglobin 12.2 (*)    HCT 36.8 (*)    RDW 16.4 (*)    All other components within normal limits  COMPREHENSIVE METABOLIC PANEL - Abnormal; Notable for the following components:   Sodium 132 (*)    Glucose, Bld 111 (*)    Calcium 8.8 (*)    Total  Protein 5.9 (*)    Albumin 3.1 (*)    Alkaline Phosphatase 33 (*)    Total Bilirubin 1.4 (*)    All other components within normal limits  I-STAT CHEM 8, ED - Abnormal; Notable for the following components:   Sodium 133 (*)    Creatinine, Ser 0.60 (*)    Glucose, Bld 109 (*)    All other components within normal limits  TROPONIN I (HIGH SENSITIVITY) - Abnormal; Notable for the following components:   Troponin I (High Sensitivity) 206 (*)    All other components within normal limits  RESP PANEL BY RT-PCR (FLU A&B, COVID) ARPGX2  TROPONIN I (HIGH SENSITIVITY)    EKG EKG Interpretation  Date/Time:  Wednesday May 13 2021 02:23:48 EST Ventricular Rate:  64 PR Interval:  140 QRS Duration: 100 QT Interval:  456 QTC Calculation: 471 R Axis:   53 Text Interpretation: Sinus rhythm Borderline T wave abnormalities Confirmed by Dory Horn) on 05/13/2021 2:29:00 AM  Radiology DG Chest Portable 1 View  Result Date: 05/13/2021 CLINICAL DATA:  Chestpaonetc.  Chest pain EXAM: PORTABLE CHEST 1 VIEW COMPARISON:  Chest x-ray 01/18/2020, CT chest 10/28/2017. FINDINGS: The heart and mediastinal contours are unchanged. Aortic calcification. Coronary artery stent noted. Right base linear atelectasis versus scarring. No focal consolidation. No pulmonary edema. No pleural effusion. No pneumothorax. No acute osseous abnormality. IMPRESSION: No active disease. Electronically Signed   By: Iven Finn M.D.   On: 05/13/2021 01:48    Procedures Procedures    Medications Ordered in ED Medications  alum & mag hydroxide-simeth (MAALOX/MYLANTA) 200-200-20 MG/5ML suspension 30 mL (has no administration in time range)  aspirin chewable tablet 324 mg (324 mg Oral Not Given 05/13/21 0225)  nitroGLYCERIN (NITROGLYN) 2 % ointment 0.5 inch (has no administration in time range)  heparin ADULT infusion 100 units/mL (25000 units/251mL) (has no administration in time range)  fentaNYL (SUBLIMAZE)  injection 50 mcg (has no administration in time range)  heparin bolus via infusion 4,000 Units (has no administration in time range)    ED Course/ Medical Decision Making/ A&P                           Medical Decision Making Amount and/or Complexity of Data Reviewed Independent Historian: EMS    Details: CP with radaition Labs: ordered. Decision-making details documented in ED Course.    Details: elevated troponin 206 Radiology: ordered. Decision-making details documented in ED Course.    Details: NACPD by me on CXR ECG/medicine tests: ordered. Decision-making details documented in ED Course.    Details: no stemi Discussion of management or test interpretation with external provider(s): 245 case  d/w Dr. Kalman Shan of cardiology who will see and admit the patient   Risk OTC drugs. Prescription drug management. Parenteral controlled substances. Drug therapy requiring intensive monitoring for toxicity. Decision regarding hospitalization.  Critical Care Total time providing critical care: 30-74 minutes (heparin drip and IV fentanyl) MDM Reviewed: previous chart, nursing note and vitals Reviewed previous: labs, ECG and x-ray Interpretation: labs, ECG and x-ray Total time providing critical care: 30-74 minutes (heparin drip and IV fentanyl). This excludes time spent performing separately reportable procedures and services. Consults: cardiology   HEART score is 7   CRITICAL CARE Performed by: Aemon Koeller K Liisa Picone-Rasch Total critical care time: 60 minutes Critical care time was exclusive of separately billable procedures and treating other patients. Critical care was necessary to treat or prevent imminent or life-threatening deterioration. Critical care was time spent personally by me on the following activities: development of treatment plan with patient and/or surrogate as well as nursing, discussions with consultants, evaluation of patient's response to treatment, examination of patient,  obtaining history from patient or surrogate, ordering and performing treatments and interventions, ordering and review of laboratory studies, ordering and review of radiographic studies, pulse oximetry and re-evaluation of patient's condition.   The patient appears reasonably stabilized for admission considering the current resources, flow, and capabilities available in the ED at this time, and I doubt any other [facility] requiring further screening and/or treatment in the ED prior to admission.  Final Clinical Impression(s) / ED Diagnoses Final diagnoses:  NSTEMI (non-ST elevated myocardial infarction) ([place])   Admit to cardiology for ACS on heparin drip     Ariyon Gerstenberger, MD 05/13/21 5284

## 2021-05-13 NOTE — ED Notes (Addendum)
324 ASA taken PTA at 2330

## 2021-05-13 NOTE — Progress Notes (Signed)
°  Transition of Care [facility]) Screening Note   Patient Details  Name: [name] Date of Birth: 10-Jan-1951   [facility]) CM/SW Contact:    Milas Gain, [place] Phone Number: 05/13/2021, 5:23 PM    Transition of Care Department [facility]) has reviewed patient and no TOC needs have been identified at this time. We will continue to monitor patient advancement through interdisciplinary progression rounds. If new patient transition needs arise, please place a TOC consult.

## 2021-05-13 NOTE — ED Triage Notes (Signed)
Pt bib [facility] from home after having chest pain that started around 3pm after eating. Pt initially took antacids with relief. The pain returned as pressure and the pt took nitro with relief, and then the pain came back a few minutes later. Hx MI and aortic aneurysm

## 2021-05-14 ENCOUNTER — Other Ambulatory Visit (HOSPITAL_COMMUNITY): Payer: Self-pay

## 2021-05-14 ENCOUNTER — Encounter (HOSPITAL_COMMUNITY): Payer: Self-pay | Admitting: Internal Medicine

## 2021-05-14 ENCOUNTER — Inpatient Hospital Stay (HOSPITAL_COMMUNITY): Payer: Medicare Other

## 2021-05-14 DIAGNOSIS — I214 Non-ST elevation (NSTEMI) myocardial infarction: Secondary | ICD-10-CM

## 2021-05-14 LAB — ECHOCARDIOGRAM COMPLETE
AR max vel: 2.55 cm2
AV Peak grad: 5.3 mmHg
Ao pk vel: 1.15 m/s
Area-P 1/2: 3.99 cm2
Height: 64 in
S' Lateral: 4 cm
Weight: 2641.6 oz

## 2021-05-14 LAB — BASIC METABOLIC PANEL
Anion gap: 9 (ref 5–15)
BUN: 13 mg/dL (ref 8–23)
CO2: 22 mmol/L (ref 22–32)
Calcium: 8.5 mg/dL — ABNORMAL LOW (ref 8.9–10.3)
Chloride: 103 mmol/L (ref 98–111)
Creatinine, Ser: 0.86 mg/dL (ref 0.61–1.24)
GFR, Estimated: >60 mL/min (ref >60)
Glucose, Bld: 120 mg/dL — ABNORMAL HIGH (ref 70–99)
Potassium: 3.6 mmol/L (ref 3.5–5.1)
Sodium: 134 mmol/L — ABNORMAL LOW (ref 135–145)

## 2021-05-14 LAB — CBC
HCT: 31.3 % — ABNORMAL LOW (ref 39.0–52.0)
Hemoglobin: 10.6 g/dL — ABNORMAL LOW (ref 13.0–17.0)
MCH: 29.6 pg (ref 26.0–34.0)
MCHC: 33.9 g/dL (ref 30.0–36.0)
MCV: 87.4 fL (ref 80.0–100.0)
Platelets: 247 10*3/uL (ref 150–400)
RBC: 3.58 MIL/uL — ABNORMAL LOW (ref 4.22–5.81)
RDW: 16.6 % — ABNORMAL HIGH (ref 11.5–15.5)
WBC: 10.2 10*3/uL (ref 4.0–10.5)
nRBC: 0 % (ref 0.0–0.2)

## 2021-05-14 LAB — MAGNESIUM: Magnesium: 1.9 mg/dL (ref 1.7–2.4)

## 2021-05-14 LAB — LIPID PANEL
Cholesterol: 137 mg/dL (ref 0–200)
HDL: 48 mg/dL (ref >40)
LDL Cholesterol: 65 mg/dL (ref 0–99)
Total CHOL/HDL Ratio: 2.9 RATIO
Triglycerides: 119 mg/dL (ref <150)
VLDL: 24 mg/dL (ref 0–40)

## 2021-05-14 LAB — HEMOGLOBIN A1C
Hgb A1c MFr Bld: 6.8 % — ABNORMAL HIGH (ref 4.8–5.6)
Mean Plasma Glucose: 148.46 mg/dL

## 2021-05-14 MED ORDER — RANOLAZINE ER 500 MG PO TB12
500.0000 mg | ORAL_TABLET | Freq: Two times a day (BID) | ORAL | Status: DC
  Administered 2021-05-14 – 2021-05-15 (×3): 500 mg via ORAL
  Filled 2021-05-14 (×3): qty 1

## 2021-05-14 MED ORDER — PERFLUTREN LIPID MICROSPHERE
1.0000 mL | INTRAVENOUS | Status: AC | PRN
  Administered 2021-05-14: 4 mL via INTRAVENOUS
  Filled 2021-05-14: qty 10

## 2021-05-14 MED ORDER — PANTOPRAZOLE SODIUM 40 MG PO TBEC
40.0000 mg | DELAYED_RELEASE_TABLET | Freq: Two times a day (BID) | ORAL | Status: DC
  Administered 2021-05-14 – 2021-05-15 (×3): 40 mg via ORAL
  Filled 2021-05-14 (×3): qty 1

## 2021-05-14 MED ORDER — ISOSORBIDE MONONITRATE ER 30 MG PO TB24
30.0000 mg | ORAL_TABLET | Freq: Every day | ORAL | Status: DC
  Administered 2021-05-14 – 2021-05-15 (×2): 30 mg via ORAL
  Filled 2021-05-14 (×2): qty 1

## 2021-05-14 MED ORDER — ISOSORBIDE MONONITRATE ER 60 MG PO TB24: 60.0000 mg | ORAL_TABLET | Freq: Every day | ORAL | Status: DC

## 2021-05-14 MED FILL — Verapamil HCl IV Soln 2.5 MG/ML: INTRAVENOUS | Qty: 2 | Status: AC

## 2021-05-14 NOTE — Progress Notes (Addendum)
Progress Note  Patient Name: JAVAUGHN OPDAHL Date of Encounter: 05/14/2021  [facility] Cardiologist: Ida Rogue, MD   Subjective   Had another episode of chest pain this morning, says this was after eating several bites of breakfast. Quickly resolved. Nitro was increased slightly.   Inpatient Medications    Scheduled Meds:  aspirin  81 mg Oral Daily   carvedilol  3.125 mg Oral BID   clopidogrel  75 mg Oral Daily   ezetimibe  10 mg Oral Daily   gabapentin  100 mg Oral TID   mirtazapine  15 mg Oral QHS   pantoprazole  40 mg Oral Daily   rosuvastatin  40 mg Oral Daily   sodium chloride flush  3 mL Intravenous Q12H   tiZANidine  4 mg Oral QHS   Continuous Infusions:  sodium chloride     nitroGLYCERIN 40 mcg/min (05/13/21 2049)   PRN Meds: sodium chloride, acetaminophen, ondansetron (ZOFRAN) IV, sodium chloride flush   Vital Signs    Vitals:   05/13/21 2220 05/14/21 0001 05/14/21 0534 05/14/21 0604  BP: 108/74 (!) 81/56 124/75   Pulse: 77 67 (!) 58   Resp:   18   Temp:   98.1 F (36.7 C)   TempSrc:   Oral   SpO2:  99% 94%   Weight:    74.9 kg  Height:    5\' 4"  (1.626 m)    Intake/Output Summary (Last 24 hours) at 05/14/2021 0748 Last data filed at 05/14/2021 0606 Gross per 24 hour  Intake --  Output 1620 ml  Net -1620 ml   Last 3 Weights 05/14/2021 05/13/2021 03/17/2021  Weight (lbs) 165 lb 1.6 oz 168 lb 169 lb  Weight (kg) 74.889 kg 76.204 kg 76.658 kg      Telemetry    SR - Personally Reviewed  ECG    No new tracing  Physical Exam   GEN: No acute distress.   Neck: No JVD Cardiac: RRR, no murmurs, rubs, or gallops.  Respiratory: Clear to auscultation bilaterally. GI: Soft, nontender, non-distended  MS: No edema; No deformity. Right femoral site with mild bruising, no hematoma.  Neuro:  Nonfocal  Psych: Normal affect   Labs    High Sensitivity Troponin:   Recent Labs  Lab 05/13/21 0102 05/13/21 0316 05/13/21 0847  TROPONINIHS  206* 524* 1,256*     Chemistry Recent Labs  Lab 05/13/21 0102 05/13/21 0131 05/13/21 0554 05/14/21 0036  NA 132* 133* 134* 134*  K 4.5 4.7 4.0 3.6  CL 100 101 106 103  CO2 23  --  24 22  GLUCOSE 111* 109* 111* 120*  BUN 9 10 9 13   CREATININE 0.65 0.60* 0.73 0.86  CALCIUM 8.8*  --  8.6* 8.5*  PROT 5.9*  --   --   --   ALBUMIN 3.1*  --   --   --   AST 38  --   --   --   ALT 23  --   --   --   ALKPHOS 33*  --   --   --   BILITOT 1.4*  --   --   --   GFRNONAA >60  --  >60 >60  ANIONGAP 9  --  4* 9    Lipids  Recent Labs  Lab 05/14/21 0036  CHOL 137  TRIG 119  HDL 48  LDLCALC 65  CHOLHDL 2.9    Hematology Recent Labs  Lab 05/13/21 0102 05/13/21 0131 05/13/21 0554  05/14/21 0036  WBC 8.7  --  9.3 10.2  RBC 4.20*  --  3.93* 3.58*  HGB 12.2* 13.3 11.5* 10.6*  HCT 36.8* 39.0 34.8* 31.3*  MCV 87.6  --  88.5 87.4  MCH 29.0  --  29.3 29.6  MCHC 33.2  --  33.0 33.9  RDW 16.4*  --  16.3* 16.6*  PLT 325  --  274 247   Thyroid No results for input(s): TSH, FREET4 in the last 168 hours.  BNPNo results for input(s): BNP, PROBNP in the last 168 hours.  DDimer No results for input(s): DDIMER in the last 168 hours.   Radiology    CARDIAC CATHETERIZATION  Result Date: 05/13/2021   Ost LM lesion is 45% stenosed.   Prox LAD-2 lesion is 100% stenosed.   Ost RCA to Dist RCA lesion is 100% stenosed.   Origin to Insertion lesion before Acute Mrg  is 100% stenosed.   Origin lesion is 100% stenosed.   Prox LAD-1 lesion is 99% stenosed.   Non-stenotic Mid LM to Dist LM lesion was previously treated.   Non-stenotic Ost Cx to Mid Cx lesion was previously treated.   Seq SVG- AVM-PDA due to known occlusion.   LIMA and is normal in caliber.   SVG due to known occlusion.   The graft exhibits no disease.   LV end diastolic pressure is normal. 1.  High-grade proximal LAD lesion which perfuses a large septal; the LIMA to the LAD does not backfill just septal.  Failed PCI due to inability to cross  and likely represents a chronic total occlusion. 2.  Moderate in-stent restenosis of proximal left circumflex with RFR of 0.92; PCI was deferred. 3.  Sequential vein graft to OM to PDA, vein graft to OM,, and native right coronary artery were not imaged as they are known occluded. 4.  LVEDP of 8 mmHg 5.  Absent left radial pulse; future cardiac catheterizations should pursue a right radial or femoral approaches depending on clinical context. Commendation: Intensive medical therapy.   DG Chest Portable 1 View  Result Date: 05/13/2021 CLINICAL DATA:  Chestpaonetc.  Chest pain EXAM: PORTABLE CHEST 1 VIEW COMPARISON:  Chest x-ray 01/18/2020, CT chest 10/28/2017. FINDINGS: The heart and mediastinal contours are unchanged. Aortic calcification. Coronary artery stent noted. Right base linear atelectasis versus scarring. No focal consolidation. No pulmonary edema. No pleural effusion. No pneumothorax. No acute osseous abnormality. IMPRESSION: No active disease. Electronically Signed   By: Iven Finn M.D.   On: 05/13/2021 01:48    Cardiac Studies   Cath: 05/13/2021      Ost LM lesion is 45% stenosed.   Prox LAD-2 lesion is 100% stenosed.   Ost RCA to Dist RCA lesion is 100% stenosed.   Origin to Insertion lesion before Acute Mrg  is 100% stenosed.   Origin lesion is 100% stenosed.   Prox LAD-1 lesion is 99% stenosed.   Non-stenotic Mid LM to Dist LM lesion was previously treated.   Non-stenotic Ost Cx to Mid Cx lesion was previously treated.   Seq SVG- AVM-PDA due to known occlusion.   LIMA and is normal in caliber.   SVG due to known occlusion.   The graft exhibits no disease.   LV end diastolic pressure is normal.   1.  High-grade proximal LAD lesion which perfuses a large septal; the LIMA to the LAD does not backfill just septal.  Failed PCI due to inability to cross and likely represents a chronic total occlusion.  2.  Moderate in-stent restenosis of proximal left circumflex with RFR of 0.92;  PCI was deferred. 3.  Sequential vein graft to OM to PDA, vein graft to OM,, and native right coronary artery were not imaged as they are known occluded. 4.  LVEDP of 8 mmHg 5.  Absent left radial pulse; future cardiac catheterizations should pursue a right radial or femoral approaches depending on clinical context.   Commendation: Intensive medical therapy.  Diagnostic Dominance: Right   Patient Profile     71 y.o. male with a history of severe multivessel CAD s/p 3v CABG in 1995 and multiple subsequent PCIs, ischemic cardiomyopathy with mildly reduced LVEF, diffuse PAD s/p left renal artery stenting, enlarging infrarenal AAA (4.6 cm by [place] 02/2021), hypertension, dyslipidemia, remote tobacco use, and current alcohol use disorder who presented with chest pain.  Assessment & Plan    NSTEMI with hx of CAD s/p CABG with multiple subsequent PCIs: hsTn 206>>524>>1256. EKG with no acute ischemic changes. Initially chest pain resolved but then returned. Underwent cardiac cath 1/18 noted above with patent LIMA-LAD but high grade pLAD lesion which supplies large septal. Failed PCI attempt due to inability to cross lesion. Moderate ISR of pLAC with RFR of 0.92, known occluded SVG-OM to PDA. Recommendations for medical therapy. Remains on IV nitro, will attempt to wean and transition to Imdur today -- continue ASA, plavix, statin, BB, zetia,    ICM: hx of mildly reduced EF, noted at 50-55%. G1DD on echo from 02/2021. No signs of volume overload on exam -- repeat echo pending   PAD: hx of abdominal aortic aneurysm 4.1cm 10/2017   HTN: did have episode of hypotension while on IV nitro. Will wean with plans to restart Imdur -- continue on coreg 3.125mg  BID   HLD: LDL 65 -- on crestor 40mg  daily, Zetia  GERD: on Prilosec PTA, will transition to Protonix given the need for plavix. Will increase protonix to BID dosing as symptoms sound somewhat GI related.   DM: Hgb A1c 6.8 -- no meds prior to  admission -- consider SGTL2 prior to DC   ETOH use   For questions or updates, please contact [place] Please consult [email or web address] for contact info under        Signed, Reino Bellis, NP  05/14/2021, 7:48 AM    Agree with note by Reino Bellis NP-C  Mr. Hardrick had another episode of chest pain this morning.  He is on IV nitroglycerin.  He underwent cardiac catheterization by Dr. Ali Lowe yesterday with attempt at PCI of a high-grade calcified proximal LAD supplying a large septal perforator.  His LIMA was patent.  He had DFR performed of his left main and circumflex which was 0.92 suggesting this was not physiologically significant.  Plan is to wean him off the IV nitroglycerin today and begin Imdur.  We will adjust his antireflux medications.  I am also going to start Ranexa.  Hopefully his oral medications will address his chest pain.  It still unclear what the "culprit lesion" was that led to a troponin leak.  Lorretta Harp, M.D., [place], [facility], [place] [address] [place], [place]  [address]  [phone] 05/14/2021 11:01 AM

## 2021-05-14 NOTE — TOC Benefit Eligibility Note (Addendum)
Patient Teacher, English as a foreign language completed.    The patient is currently admitted and upon discharge could be taking Jardiance 10 mg.  The current 30 day co-pay is, $552.00 due to a $505.00 deductible remaining.   The patient is currently admitted and upon discharge could be taking Farxiga 10 mg.  The current 30 day co-pay is, $564.48 due to a $505.00 deductible remaining.   The patient is currently admitted and upon discharge could be taking ranolazine (Ranexa) 500 mg.  The current 30 day co-pay is, $52.68  The patient is currently admitted and upon discharge could be taking dexlansoprazole (Dexilant) 30 mg.  The current 30 day co-pay is, $52.68  The patient is insured through [place], [place] Patient Advocate Specialist [place] Patient Advocate Team Direct Number: [phone]  Fax: [phone]

## 2021-05-14 NOTE — Progress Notes (Addendum)
Pt was having shortness of breath, placed 2L [place]. BP retaken that resulted at 81/56 with a MAP of 64. Nitro gtt titrated down to 21mcg; Mitzie Na, MD made aware. Will continue to monitor.   Elaina Hoops, RN

## 2021-05-14 NOTE — Progress Notes (Signed)
Echocardiogram 2D Echocardiogram has been performed.  [name] 05/14/2021, 12:07 PM

## 2021-05-14 NOTE — Progress Notes (Signed)
RN notified of frequent PVCs,  monitor tech states that patient now having 3-6 PVCs (VT).   Reino Bellis NP aware, states she will order a mag level.

## 2021-05-14 NOTE — Progress Notes (Addendum)
Patient called RN stating he was having CP.    BP=150/86.  Reino Bellis, NP walked in to assess patient.   4L O2 [place] applied  IV nitro increased to 27mcg/min or 7.5 ml/hr.    Patient now stating that chest pain is getting better.

## 2021-05-14 NOTE — Care Management (Signed)
°  Transition of Care [facility]) Screening Note   Patient Details  Name: FLEET HIGHAM Date of Birth: 01/02/1951   [facility]) CM/SW Contact:    Bethena Roys, RN Phone Number: 05/14/2021, 12:48 PM    Transition of Care Department [facility]) has reviewed the patient and no TOC needs have been identified at this time. We will continue to monitor patient advancement through interdisciplinary progression rounds. If new patient transition needs arise, please place a TOC consult.

## 2021-05-15 ENCOUNTER — Other Ambulatory Visit (HOSPITAL_COMMUNITY): Payer: Self-pay

## 2021-05-15 DIAGNOSIS — I214 Non-ST elevation (NSTEMI) myocardial infarction: Secondary | ICD-10-CM | POA: Diagnosis not present

## 2021-05-15 LAB — CBC
HCT: 34.9 % — ABNORMAL LOW (ref 39.0–52.0)
Hemoglobin: 11.6 g/dL — ABNORMAL LOW (ref 13.0–17.0)
MCH: 29.1 pg (ref 26.0–34.0)
MCHC: 33.2 g/dL (ref 30.0–36.0)
MCV: 87.7 fL (ref 80.0–100.0)
Platelets: 246 10*3/uL (ref 150–400)
RBC: 3.98 MIL/uL — ABNORMAL LOW (ref 4.22–5.81)
RDW: 16.4 % — ABNORMAL HIGH (ref 11.5–15.5)
WBC: 8.2 10*3/uL (ref 4.0–10.5)
nRBC: 0 % (ref 0.0–0.2)

## 2021-05-15 LAB — BASIC METABOLIC PANEL
Anion gap: 10 (ref 5–15)
BUN: 10 mg/dL (ref 8–23)
CO2: 23 mmol/L (ref 22–32)
Calcium: 8.8 mg/dL — ABNORMAL LOW (ref 8.9–10.3)
Chloride: 103 mmol/L (ref 98–111)
Creatinine, Ser: 0.79 mg/dL (ref 0.61–1.24)
GFR, Estimated: >60 mL/min (ref >60)
Glucose, Bld: 131 mg/dL — ABNORMAL HIGH (ref 70–99)
Potassium: 3.4 mmol/L — ABNORMAL LOW (ref 3.5–5.1)
Sodium: 136 mmol/L (ref 135–145)

## 2021-05-15 MED ORDER — PANTOPRAZOLE SODIUM 40 MG PO TBEC
40.0000 mg | DELAYED_RELEASE_TABLET | Freq: Two times a day (BID) | ORAL | 2 refills | Status: DC
  Filled 2021-05-15: qty 60, 30d supply, fill #0

## 2021-05-15 MED ORDER — RANOLAZINE ER 500 MG PO TB12
500.0000 mg | ORAL_TABLET | Freq: Two times a day (BID) | ORAL | 2 refills | Status: DC
  Filled 2021-05-15: qty 60, 30d supply, fill #0

## 2021-05-15 MED ORDER — POTASSIUM CHLORIDE CRYS ER 20 MEQ PO TBCR
40.0000 meq | EXTENDED_RELEASE_TABLET | Freq: Once | ORAL | Status: AC
  Administered 2021-05-15: 40 meq via ORAL
  Filled 2021-05-15: qty 2

## 2021-05-15 NOTE — Plan of Care (Signed)
  Problem: Education: Goal: Knowledge of General Education information will improve Description: Including pain rating scale, medication(s)/side effects and non-pharmacologic comfort measures Outcome: Adequate for Discharge   

## 2021-05-15 NOTE — Discharge Summary (Addendum)
Discharge Summary    Patient ID: [name] MRN: [id number]; DOB: [birth year]  Admit date: 05/13/2021 Discharge date: 05/15/2021  PCP:  Venia Carbon, MD   [facility] Providers Cardiologist:  Ida Rogue, MD     Discharge Diagnoses    Principal Problem:   NSTEMI (non-ST elevated myocardial infarction) [facility]) Active Problems:   Hyperlipidemia LDL goal <70   Essential hypertension   Heart failure, diastolic, chronic ([place])   COPD (chronic obstructive pulmonary disease) ([place])  Diagnostic Studies/Procedures    Cath: 05/13/2021     Ost LM lesion is 45% stenosed.   Prox LAD-2 lesion is 100% stenosed.   Ost RCA to Dist RCA lesion is 100% stenosed.   Origin to Insertion lesion before Acute Mrg  is 100% stenosed.   Origin lesion is 100% stenosed.   Prox LAD-1 lesion is 99% stenosed.   Non-stenotic Mid LM to Dist LM lesion was previously treated.   Non-stenotic Ost Cx to Mid Cx lesion was previously treated.   Seq SVG- AVM-PDA due to known occlusion.   LIMA and is normal in caliber.   SVG due to known occlusion.   The graft exhibits no disease.   LV end diastolic pressure is normal.   1.  High-grade proximal LAD lesion which perfuses a large septal; the LIMA to the LAD does not backfill just septal.  Failed PCI due to inability to cross and likely represents a chronic total occlusion. 2.  Moderate in-stent restenosis of proximal left circumflex with RFR of 0.92; PCI was deferred. 3.  Sequential vein graft to OM to PDA, vein graft to OM,, and native right coronary artery were not imaged as they are known occluded. 4.  LVEDP of 8 mmHg 5.  Absent left radial pulse; future cardiac catheterizations should pursue a right radial or femoral approaches depending on clinical context.   Commendation: Intensive medical therapy.   Diagnostic Dominance: Right  _____________   History of Present Illness     [name] is a 71 y.o. male with  with a history of severe  multivessel CAD s/p 3v CABG in 1995 and multiple subsequent PCIs from 2004 to 2012, beginning with an inferior STEMI in 2004 (unclear intervention). His most recent angiography in 2019 showed known CTOs of the [place], [place], SVG-AM-PDA, and SVG-OM, widely patent LIMA-LAD with retograde fills to the CTO with antegrade flow brisk to the [place], and 40% proximal LM stenosis (stable lesion just prior to a stent) followed by widely patent stents running from pLM into LCx-OM with brisk distal flow, and collaterals to RPL system. He also has a history of ischemic cardiomyopathy with mildly reduced LVEF, diffuse PAD s/p left renal artery stenting, enlarging infrarenal AAA (4.6 cm by [place] 02/2021), hypertension, dyslipidemia, remote tobacco use, and current alcohol use disorder.   He had been active, currently working on home renovations. He recalled feeling a little "off" the afternoon prior to admission and stopped his work at 4:30pm. He had a sandwich for lunch with a spicy sauce and experienced what he thought was heartburn after eating. However this progressed to a more familiar anginal pain in his left chest with radiation to his left arm for a few minutes into the jaw. No improvement with antacids. He was laying down that evening when the pain started worsening so he took 2 nitroglycerins which helped but not completely resolved the pain. It was non pleuritic. Denied associated sob, orthopnea, diaphoresis. Had been feeling a little more dyspneic with  activity over the last 6 months though. No leg swelling or abdominal tightness that he can tell. Took 324mg  aspirin pta. On arrival, vitals overall wnl. Troponin 206->524. ECG showed old inferior infarct and borderline nonspecific t-wave changes, but overall reassuring. CP resolved after fentanyl and additional nitro in the ED and he was started on heparin. Admitted to cardiology for further management.     Hospital Course     NSTEMI with hx of CAD s/p CABG with multiple  subsequent PCIs: hsTn 206>>524>>1256. EKG with no acute ischemic changes. Initially chest pain resolved but then returned. Underwent cardiac cath 1/18 noted above with patent LIMA-LAD but high grade pLAD lesion which supplies large septal. Failed PCI attempt due to inability to cross lesion. Moderate ISR of pLAC with RFR of 0.92, known occluded SVG-OM to PDA. Recommendations for medical therapy. Had recurrent chest pain post cath but was able to wean from IV nitro. Placed on Ranexa 500mg  BID. -- continue ASA, plavix, statin, BB, zetia, Imdur 30mg  daily, and Ranexa 500mg  BID   ICM: hx of mildly reduced EF, noted at 50-55%. G1DD on echo from 02/2021. No signs of volume overload on exam -- Echo this admission with LVEF of 45-50%, G1DD and akinesis of the basal inferior, inferolateral and inferoseptal walls    PAD: hx of abdominal aortic aneurysm 4.1cm 10/2017   HTN: reports issues with soft blood pressures as an outpatient. Had to stop lisinopril because of this. Did have one episode of hypotension while in IV nitro, but otherwise stable -- continue on coreg 3.125mg  BID   HLD: LDL 65 -- on crestor 40mg  daily, Zetia   GERD: on Prilosec PTA, will transition to Protonix given the need for plavix.  -- Increased protonix to BID dosing as symptoms sound somewhat GI related.    DM: Hgb A1c 6.8 -- no meds prior to admission -- Considered SGLT2 but deductible is high -- will refer back to PCP for further management and follow up testing, for now recommend on-going diet and exercise   Possible COPD?: reports long hx of tobacco use. Has never had a formal work up.  -- recommend outpatient evaluation via PCP   ETOH use   General: Well developed, well nourished, male appearing in no acute distress. Head: Normocephalic, atraumatic.  Neck: Supple without bruits, JVD. Lungs:  Resp regular and unlabored, CTA. Heart: RRR, S1, S2, no S3, S4, or murmur; no rub. Abdomen: Soft, non-tender, non-distended with  normoactive bowel sounds. No hepatomegaly. No rebound/guarding. No obvious abdominal masses. Extremities: No clubbing, cyanosis, edema. Distal pedal pulses are 2+ bilaterally. Right femoral cath site stable without bruising or hematoma Neuro: Alert and oriented X 3. Moves all extremities spontaneously. Psych: Normal affect.  Patient was seen by Dr. Gwenlyn Found and deemed stable for discharge home. Follow up in the office has been arranged. Medications sent to the [facility]. Educated by PharmD prior to discharge.   Did the patient have an acute coronary syndrome (MI, NSTEMI, STEMI, etc) this admission?:  Yes                               AHA/ACC Clinical Performance & Quality Measures: Aspirin prescribed? - Yes ADP Receptor Inhibitor (Plavix/Clopidogrel, Brilinta/Ticagrelor or Effient/Prasugrel) prescribed (includes medically managed patients)? - Yes Beta Blocker prescribed? - Yes High Intensity Statin (Lipitor 40-80mg  or Crestor 20-40mg ) prescribed? - Yes EF assessed during THIS hospitalization? - Yes For EF <40%, was ACEI/ARB prescribed? -  Not Applicable (EF >/= 47%) For EF <40%, Aldosterone Antagonist (Spironolactone or Eplerenone) prescribed? - Not Applicable (EF >/= 42%) Cardiac Rehab Phase II ordered (including medically managed patients)? - Yes       The patient will be scheduled for a TOC follow up appointment in 10-14 days.  A message has been sent to the [facility] and Scheduling Pool at the office where the patient should be seen for follow up.  _____________  Discharge Vitals Blood pressure 118/62, pulse 64, temperature 97.7 F (36.5 C), temperature source Oral, resp. rate 18, height 5\' 4"  (1.626 m), weight 74.2 kg, SpO2 94 %.  Filed Weights   05/13/21 0107 05/14/21 0604 05/15/21 0515  Weight: 76.2 kg 74.9 kg 74.2 kg    Labs & Radiologic Studies    CBC Recent Labs    05/13/21 0102 05/13/21 0131 05/14/21 0036 05/15/21 0657  WBC 8.7   < > 10.2 8.2  NEUTROABS 5.8  --   --    --   HGB 12.2*   < > 10.6* 11.6*  HCT 36.8*   < > 31.3* 34.9*  MCV 87.6   < > 87.4 87.7  PLT 325   < > 247 246   < > = values in this interval not displayed.   Basic Metabolic Panel Recent Labs    05/14/21 0036 05/15/21 0657  NA 134* 136  K 3.6 3.4*  CL 103 103  CO2 22 23  GLUCOSE 120* 131*  BUN 13 10  CREATININE 0.86 0.79  CALCIUM 8.5* 8.8*  MG 1.9  --    Liver Function Tests Recent Labs    05/13/21 0102  AST 38  ALT 23  ALKPHOS 33*  BILITOT 1.4*  PROT 5.9*  ALBUMIN 3.1*   No results for input(s): LIPASE, AMYLASE in the last 72 hours. High Sensitivity Troponin:   Recent Labs  Lab 05/13/21 0102 05/13/21 0316 05/13/21 0847  TROPONINIHS 206* 524* 1,256*    BNP Invalid input(s): POCBNP D-Dimer No results for input(s): DDIMER in the last 72 hours. Hemoglobin A1C Recent Labs    05/14/21 0036  HGBA1C 6.8*   Fasting Lipid Panel Recent Labs    05/14/21 0036  CHOL 137  HDL 48  LDLCALC 65  TRIG 119  CHOLHDL 2.9   Thyroid Function Tests No results for input(s): TSH, T4TOTAL, T3FREE, THYROIDAB in the last 72 hours.  Invalid input(s): FREET3 _____________  CARDIAC CATHETERIZATION  Result Date: 05/13/2021   Ost LM lesion is 45% stenosed.   Prox LAD-2 lesion is 100% stenosed.   Ost RCA to Dist RCA lesion is 100% stenosed.   Origin to Insertion lesion before Acute Mrg  is 100% stenosed.   Origin lesion is 100% stenosed.   Prox LAD-1 lesion is 99% stenosed.   Non-stenotic Mid LM to Dist LM lesion was previously treated.   Non-stenotic Ost Cx to Mid Cx lesion was previously treated.   Seq SVG- AVM-PDA due to known occlusion.   LIMA and is normal in caliber.   SVG due to known occlusion.   The graft exhibits no disease.   LV end diastolic pressure is normal. 1.  High-grade proximal LAD lesion which perfuses a large septal; the LIMA to the LAD does not backfill just septal.  Failed PCI due to inability to cross and likely represents a chronic total occlusion. 2.   Moderate in-stent restenosis of proximal left circumflex with RFR of 0.92; PCI was deferred. 3.  Sequential vein graft to OM  to PDA, vein graft to OM,, and native right coronary artery were not imaged as they are known occluded. 4.  LVEDP of 8 mmHg 5.  Absent left radial pulse; future cardiac catheterizations should pursue a right radial or femoral approaches depending on clinical context. Commendation: Intensive medical therapy.   DG Chest Portable 1 View  Result Date: 05/13/2021 CLINICAL DATA:  Chestpaonetc.  Chest pain EXAM: PORTABLE CHEST 1 VIEW COMPARISON:  Chest x-ray 01/18/2020, CT chest 10/28/2017. FINDINGS: The heart and mediastinal contours are unchanged. Aortic calcification. Coronary artery stent noted. Right base linear atelectasis versus scarring. No focal consolidation. No pulmonary edema. No pleural effusion. No pneumothorax. No acute osseous abnormality. IMPRESSION: No active disease. Electronically Signed   By: Iven Finn M.D.   On: 05/13/2021 01:48   ECHOCARDIOGRAM COMPLETE  Result Date: 05/14/2021    ECHOCARDIOGRAM REPORT   Patient Name:   [name] Date of Exam: 05/14/2021 Medical Rec #:  [id number]        Height:       64.0 in Accession #:    [id number]       Weight:       165.1 lb Date of Birth:  22-Feb-1951        BSA:          1.803 m Patient Age:    89 years         BP:           122/78 mmHg Patient Gender: M                HR:           72 bpm. Exam Location:  Inpatient Procedure: 2D Echo and Intracardiac Opacification Agent Indications:    Nstemi  History:        Patient has prior history of Echocardiogram examinations, most                 recent 02/26/2021. CAD, COPD; Risk Factors:Hypertension.  Sonographer:    Jefferey Pica Referring Phys: [place]  1. Akinesis of the basal inferior, inferolateral and inferoseptal walls with overall mild LV dysfunction.  2. Left ventricular ejection fraction, by estimation, is 45 to 50%. The left ventricle has  mildly decreased function. The left ventricle demonstrates regional wall motion abnormalities (see scoring diagram/findings for description). Left ventricular diastolic parameters are consistent with Grade I diastolic dysfunction (impaired relaxation).  3. Right ventricular systolic function is normal. The right ventricular size is normal.  4. The mitral valve is normal in structure. No evidence of mitral valve regurgitation. No evidence of mitral stenosis.  5. The aortic valve has an indeterminant number of cusps. Aortic valve regurgitation is not visualized. No aortic stenosis is present.  6. The inferior vena cava is normal in size with greater than 50% respiratory variability, suggesting right atrial pressure of 3 mmHg. FINDINGS  Left Ventricle: Left ventricular ejection fraction, by estimation, is 45 to 50%. The left ventricle has mildly decreased function. The left ventricle demonstrates regional wall motion abnormalities. The left ventricular internal cavity size was normal in size. There is no left ventricular hypertrophy. Left ventricular diastolic parameters are consistent with Grade I diastolic dysfunction (impaired relaxation). Right Ventricle: The right ventricular size is normal. Right ventricular systolic function is normal. Left Atrium: Left atrial size was normal in size. Right Atrium: Right atrial size was normal in size. Pericardium: There is no evidence of pericardial effusion. Mitral Valve: The mitral valve is normal  in structure. No evidence of mitral valve regurgitation. No evidence of mitral valve stenosis. Tricuspid Valve: The tricuspid valve is normal in structure. Tricuspid valve regurgitation is trivial. No evidence of tricuspid stenosis. Aortic Valve: The aortic valve has an indeterminant number of cusps. Aortic valve regurgitation is not visualized. No aortic stenosis is present. Aortic valve peak gradient measures 5.3 mmHg. Pulmonic Valve: The pulmonic valve was normal in structure.  Pulmonic valve regurgitation is not visualized. No evidence of pulmonic stenosis. Aorta: The aortic root is normal in size and structure. Venous: The inferior vena cava is normal in size with greater than 50% respiratory variability, suggesting right atrial pressure of 3 mmHg. IAS/Shunts: No atrial level shunt detected by color flow Doppler. Additional Comments: Akinesis of the basal inferior, inferolateral and inferoseptal walls with overall mild LV dysfunction.  LEFT VENTRICLE PLAX 2D LVIDd:         5.25 cm   Diastology LVIDs:         4.00 cm   LV e' medial:    7.70 cm/s LV PW:         0.85 cm   LV E/e' medial:  8.5 LV IVS:        0.85 cm   LV e' lateral:   7.38 cm/s LVOT diam:     2.00 cm   LV E/e' lateral: 8.8 LV SV:         49 LV SV Index:   27 LVOT Area:     3.14 cm  IVC IVC diam: 1.40 cm LEFT ATRIUM             Index        RIGHT ATRIUM           Index LA diam:        4.00 cm 2.22 cm/m   RA Area:     11.00 cm LA Vol (A2C):   44.0 ml 24.40 ml/m  RA Volume:   24.20 ml  13.42 ml/m LA Vol (A4C):   46.1 ml 25.56 ml/m LA Biplane Vol: 45.0 ml 24.95 ml/m  AORTIC VALVE                 PULMONIC VALVE AV Area (Vmax): 2.55 cm     PV Vmax:       0.68 m/s AV Vmax:        115.00 cm/s  PV Peak grad:  1.8 mmHg AV Peak Grad:   5.3 mmHg LVOT Vmax:      93.40 cm/s LVOT Vmean:     52.300 cm/s LVOT VTI:       0.157 m  AORTA Ao Root diam: 3.60 cm Ao Asc diam:  3.60 cm MITRAL VALVE MV Area (PHT): 3.99 cm     SHUNTS MV Decel Time: 190 msec     Systemic VTI:  0.16 m MV E velocity: 65.10 cm/s   Systemic Diam: 2.00 cm MV A velocity: 105.00 cm/s MV E/A ratio:  0.62 Kirk Ruths MD Electronically signed by Kirk Ruths MD Signature Date/Time: 05/14/2021/2:28:41 PM    Final    Disposition   Pt is being discharged home today in good condition.  Follow-up Plans & Appointments     Follow-up Information     Venia Carbon, MD Follow up.   Specialties: Internal Medicine, Pediatrics Why: Please follow up with PCP  regarding management of your Hgb A1c and management within the next 2-3 weeks Contact information: [address] [place] [address] [phone]  Rise Mu, PA-C Follow up on 05/29/2021.   Specialties: Physician Assistant, Cardiology, Radiology Why: at 8:25am for your follow up appt with Dr. Gwenyth Ober' [place] information: [place] [phone]                Discharge Instructions     Amb Referral to Cardiac Rehabilitation   Complete by: As directed    Diagnosis: NSTEMI   After initial evaluation and assessments completed: Virtual Based Care may be provided alone or in conjunction with Phase 2 Cardiac Rehab based on patient barriers.: Yes   Call MD for:  difficulty breathing, headache or visual disturbances   Complete by: As directed    Call MD for:  persistant dizziness or light-headedness   Complete by: As directed    Call MD for:  redness, tenderness, or signs of infection (pain, swelling, redness, odor or green/yellow discharge around incision site)   Complete by: As directed    Diet - low sodium heart healthy   Complete by: As directed    Discharge instructions   Complete by: As directed    Groin Site Care Refer to this sheet in the next few weeks. These instructions provide you with information on caring for yourself after your procedure. Your caregiver may also give you more specific instructions. Your treatment has been planned according to current medical practices, but problems sometimes occur. Call your caregiver if you have any problems or questions after your procedure. HOME CARE INSTRUCTIONS You may shower 24 hours after the procedure. Remove the bandage (dressing) and gently wash the site with plain soap and water. Gently pat the site dry.  Do not apply powder or lotion to the site.  Do not sit in a bathtub, swimming pool, or whirlpool for 5 to 7 days.  No bending, squatting, or lifting  anything over 10 pounds (4.5 kg) as directed by your caregiver.  Inspect the site at least twice daily.  Do not drive home if you are discharged the same day of the procedure. Have someone else drive you.  You may drive 24 hours after the procedure unless otherwise instructed by your caregiver.  What to expect: Any bruising will usually fade within 1 to 2 weeks.  Blood that collects in the tissue (hematoma) may be painful to the touch. It should usually decrease in size and tenderness within 1 to 2 weeks.  SEEK IMMEDIATE MEDICAL CARE IF: You have unusual pain at the groin site or down the affected leg.  You have redness, warmth, swelling, or pain at the groin site.  You have drainage (other than a small amount of blood on the dressing).  You have chills.  You have a fever or persistent symptoms for more than 72 hours.  You have a fever and your symptoms suddenly get worse.  Your leg becomes pale, cool, tingly, or numb.  You have heavy bleeding from the site. Hold pressure on the site. .   Increase activity slowly   Complete by: As directed        Discharge Medications   Allergies as of 05/15/2021       Reactions   Metoprolol Tartrate Hives, Itching   Quinolones Other (See Comments)   Contraindicated with AAA   Metoclopramide Nausea Only   Other reaction(s): Unknown        Medication List     STOP taking these medications    omeprazole 20 MG capsule Commonly known as:  PRILOSEC Replaced by: pantoprazole 40 MG tablet       TAKE these medications    aspirin 81 MG tablet Take 81 mg by mouth daily.   BLACK ELDERBERRY PO Take 1 each by mouth daily.   carvedilol 3.125 MG tablet Commonly known as: COREG TAKE 1 TABLET (3.125 MG TOTAL) BY MOUTH 2 (TWO) TIMES DAILY.   clopidogrel 75 MG tablet Commonly known as: PLAVIX TAKE 1 TABLET (75 MG TOTAL) BY MOUTH DAILY.   ezetimibe 10 MG tablet Commonly known as: ZETIA TAKE 1 TABLET (10 MG TOTAL) BY MOUTH DAILY.    gabapentin 100 MG capsule Commonly known as: NEURONTIN Take 100 mg by mouth 3 (three) times daily.   isosorbide mononitrate 30 MG 24 hr tablet Commonly known as: IMDUR TAKE ONE a day What changed:  how much to take how to take this when to take this additional instructions   meclizine 25 MG tablet Commonly known as: ANTIVERT TAKE 1 TABLET BY MOUTH 3 TIMES DAILY AS NEEDED FOR DIZZINESS. What changed: See the new instructions.   mirtazapine 15 MG tablet Commonly known as: REMERON TAKE 1 TABLET BY MOUTH EVERYDAY AT BEDTIME What changed: See the new instructions.   multivitamin with minerals tablet Take 1 tablet by mouth daily.   nitroGLYCERIN 0.4 MG SL tablet Commonly known as: NITROSTAT Place 1 tablet (0.4 mg total) under the tongue every 5 (five) minutes as needed for chest pain.   pantoprazole 40 MG tablet Commonly known as: PROTONIX Take 1 tablet (40 mg total) by mouth 2 (two) times daily. Replaces: omeprazole 20 MG capsule   ranolazine 500 MG 12 hr tablet Commonly known as: RANEXA Take 1 tablet (500 mg total) by mouth 2 (two) times daily.   rosuvastatin 40 MG tablet Commonly known as: CRESTOR Take 1 tablet (40 mg total) by mouth daily.   tiZANidine 4 MG tablet Commonly known as: ZANAFLEX Take 4 mg by mouth at bedtime.   traMADol 50 MG tablet Commonly known as: ULTRAM as needed.       Outstanding Labs/Studies   N/a   Duration of Discharge Encounter   Greater than 30 minutes including physician time.  Signed, Reino Bellis, NP 05/15/2021, 10:17 AM   Agree with note by Reino Bellis NP-C  Mr. Idrovo was admitted with a non-STEMI.  He had a remote CABG and stenting of his left main into his circumflex and obtuse marginal branch.  He has a known occluded RCA and RCA vein graft.  Dr. Ali Lowe  performed cardiac catheterization demonstrating a patent left main and patent circumflex stent.  An a DFR was performed of the circumflex which was 0.92  suggesting this was not significant.  Dr. Ali Lowe attempted to cross a subtotally occluded proximal LAD stenosis which supplied a large first septal perforator unsuccessfully.  It was decided to treat him medically.  He has remained pain-free.  He stable for discharge home today, follow-up with Dr. Rockey Situ.  Lorretta Harp, M.D., [place], [facility], [place] [address] [place], [place]  [address]  [phone] 05/15/2021 1:05 PM

## 2021-05-15 NOTE — Progress Notes (Signed)
Discharge instructions (including medications) discussed with and copy provided to patient/caregiver 

## 2021-05-15 NOTE — Progress Notes (Signed)
CARDIAC REHAB PHASE I   PRE:  Rate/Rhythm: 72 SR    BP: sitting 145/84    SaO2: 99 RA  MODE:  Ambulation: 400 ft   POST:  Rate/Rhythm: 90 SR    BP: sitting 164/82     SaO2: 100 RA  Pt denied CP walking but did note significant SOB, which he sts is his norm. He had previous hx of heavy smoking and wants to f/u with PCP for ? COPD. Pt also needs to see PCP regarding new a1c 6.8. We discussed MI, restrictions, diet including decreasing ETOH for carb control, exercise, NTG and CRPII. Pt very receptive. Will refer to [place], ACSM 05/15/2021 9:57 AM

## 2021-05-27 NOTE — Progress Notes (Signed)
Cardiology Office Note    Date:  05/29/2021   ID:  CHA GOMILLION, DOB 01-Dec-1950, MRN [id number]  PCP:  Venia Carbon, MD  Cardiologist:  Ida Rogue, MD  Electrophysiologist:  None   Chief Complaint: Hospital follow up  History of Present Illness:   [name] is a 71 y.o. male with history of CAD status post three-vessel CABG in 1995 with multiple subsequent PCI's beginning with an inferior STEMI in 2004 with unclear intervention, HFrEF secondary to ICM, diffuse PAD status post left renal artery stenting, enlarging infrarenal AAA measuring 4.6 cm by ultrasound in 02/2021, DM, HTN, HLD, remote tobacco use, and alcohol use who presents for hospital follow-up as outlined below.  LHC from 2019 showed CTO's of the ostial RCA, mid LAD, SVG to OM to PDA and SVG to OM, with a widely patent LIMA to LAD that retrograde filled the CTO with antegrade flow brisk to the apex and 40% proximal left main stenosis which was a stable lesion just prior to a previously placed stent followed by widely patent stents running from the proximal left main into the LCx-OM with brisk distal flow and collaterals to RPL system.  Echo at that time showed an EF of 93%, grade 1 diastolic dysfunction, mild LVH, akinesis of the basal inferior and basal inferolateral myocardium, severe hypokinesis in the mid inferior and mid inferolateral myocardium, and mildly reduced RV systolic function.  He was admitted to the hospital from 1/18 through 1/20 with an NSTEMI.  High-sensitivity troponin peaked at 1256.  EKG showed old inferior infarct with borderline nonspecific ST-T changes.  Chest pain resolved with additional nitro and fentanyl.  LHC on 05/13/2021 showed diffuse underlying native vessel CAD with high-grade proximal LAD lesion which perfused a large septal branch, the LIMA to LAD did not backfill the septal branch.  There was failed PCI due to inability to cross and likely represented CTO.  There was moderate  in-stent restenosis of the proximal LCx with an RFR of 0.92, PCI was deferred.  Sequential vein graft to OM and PDA along with vein graft to OM, native RCA were not imaged as they were known to be occluded.  LVEDP 8 mmHg.  Of note, there was an absent left radial pulse with recommendation for future cardiac cath to be pursued via right radial or femoral artery approaches.  Medical therapy was recommended.  Echo during that admission showed an EF of 45 to 50%, akinesis of the basal inferior, inferolateral, and inferoseptal walls, grade 1 diastolic dysfunction, normal RV systolic function and ventricular cavity size, no significant valvular abnormalities, and an estimated right atrial pressure of 3 mmHg.  He was initiated on ranolazine 500 mg twice daily.  He comes in doing reasonably well from a cardiac perspective.  He continues to note chronic stable exertional dyspnea/chest without frank angina.  He did become short of breath walking into the office today.  Prior to his hospital admission last month, he was drinking 8-10 beers nightly.  Since his hospital discharge he has decreased this to 2 light beers on a nightly basis.  He is tolerating ranolazine.  Blood pressure has typically been in the 130s over 80s, occasionally into the 235T systolic.  No lower extremity swelling.  He has stable two-pillow orthopnea.  No early satiety.  He is watching his salt intake and drinks approximately 2 L of fluid per day.  No falls, hematochezia, or melena.  No cath site complications.   Labs independently reviewed:  04/2021 - Hgb 11.6, PLT 246, potassium 3.4, BUN 10, serum creatinine 0.79, magnesium 1.9, A1c 6.8, TC 137, TG 119, HDL 48, LDL 65 10/2017 - TSH normal  Past Medical History:  Diagnosis Date   AAA (abdominal aortic aneurysm)    a. Duplex 01/2012: stable infrarenal saccular AAA at 3.3cm x 3.2xm (f/u recommended 01/2013 per Dr. Rockey Situ)   Alcohol abuse    CAD (coronary artery disease)    a. 1995 s/p CABG x 4:  LIMA->LAD, VG->RI (known to be occluded), VG->AM->PDA;  b. 05/2002 Inf STEMI: VG->AM->PDA 100% treated w/ 2 BMS complicated by acute thrombosis req 3 BMS;  c. 07/2003 DES to  LAD & LCX (VG's to PDA & RI 100%);  d. 12/2010 Acute MI ([place]): DES to LCX & LM , LIMA ok,;  e.12/2011 Cath: LM/LCX stents ok , LIMA patent.   Cardiomyopathy ([place])    a. EF 35% by cath 2015.   Diverticulitis    1/06 Diverticulitis--CT of pelvis--diffuse sigmoid divertic   Dyspnea    GERD (gastroesophageal reflux disease)    Hyperlipidemia    Hypertension    Myocardial infarction [facility])    PAD (peripheral artery disease) ([place])    a. external iliac and mesenteric stenosis noted by noninvasive imaging.   Renal artery stenosis ([place])    a. 03/2011 PTA and stenting of L RA. b. last duplex 2016 with stable 1-59% bilateral RAS, incidental >50% R EIA stenosis    Past Surgical History:  Procedure Laterality Date   [place]     8/09  Cath--vein graft occlusions which are old--no acute changes   CARDIAC CATHETERIZATION  11/13/2010   stent x 2 @ [place]   CARDIAC CATHETERIZATION  04/05/2014   stent placement    CARDIAC CATHETERIZATION  10/28/2017   CLOSED REDUCTION SHOULDER DISLOCATION     COLONOSCOPY WITH PROPOFOL N/A 12/22/2018   Procedure: COLONOSCOPY WITH PROPOFOL;  Surgeon: Jonathon Bellows, MD;  Location: [facility] ENDOSCOPY;  Service: Gastroenterology;  Laterality: N/A;   CORONARY ANGIOPLASTY  04/05/2014   stent placement OM 1   CORONARY ARTERY BYPASS GRAFT     CORONARY STENT PLACEMENT  7/12   2 stents--Promus element plus (everolimus eluting)--[facility] in [place] WIRE/FFR STUDY N/A 05/13/2021   Procedure: INTRAVASCULAR PRESSURE WIRE/FFR STUDY;  Surgeon: Early Osmond, MD;  Location: [place] CV LAB;  Service: Cardiovascular;  Laterality: N/A;   LEFT HEART CATH AND CORONARY ANGIOGRAPHY N/A 05/13/2021   Procedure: LEFT HEART CATH AND CORONARY ANGIOGRAPHY;   Surgeon: Early Osmond, MD;  Location: [place] CV LAB;  Service: Cardiovascular;  Laterality: N/A;   LEFT HEART CATH AND CORS/GRAFTS ANGIOGRAPHY N/A 10/28/2017   Procedure: LEFT HEART CATH AND CORS/GRAFTS ANGIOGRAPHY;  Surgeon: Leonie Man, MD;  Location: [place] CV LAB;  Service: Cardiovascular;  Laterality: N/A;   LEFT HEART CATHETERIZATION WITH CORONARY/GRAFT ANGIOGRAM N/A 01/04/2012   Procedure: LEFT HEART CATHETERIZATION WITH Beatrix Fetters;  Surgeon: Burnell Blanks, MD;  Location[facility] CATH LAB;  Service: Cardiovascular;  Laterality: N/A;   LEFT HEART CATHETERIZATION WITH CORONARY/GRAFT ANGIOGRAM N/A 04/05/2014   Procedure: LEFT HEART CATHETERIZATION WITH Beatrix Fetters;  Surgeon: Jettie Booze, MD;  Location: [facility] CATH LAB;  Service: Cardiovascular;  Laterality: N/A;   PERCUTANEOUS CORONARY STENT INTERVENTION (PCI-S)  04/05/2014   Procedure: PERCUTANEOUS CORONARY STENT INTERVENTION (PCI-S);  Surgeon: Jettie Booze, MD;  Location[facility] CATH LAB;  Service: Cardiovascular;;  OM1  RENAL ANGIOGRAM N/A 04/16/2011   Procedure: RENAL ANGIOGRAM;  Surgeon: Burnell Blanks, MD;  Location: [facility] CATH LAB;  Service: Cardiovascular;  Laterality: N/A;   RENAL ARTERY STENT  03/2011 ?    Current Medications: Current Meds  Medication Sig   aspirin 81 MG tablet Take 81 mg by mouth daily.   BLACK ELDERBERRY PO Take 1 each by mouth daily.   carvedilol (COREG) 3.125 MG tablet TAKE 1 TABLET (3.125 MG TOTAL) BY MOUTH 2 (TWO) TIMES DAILY.   clopidogrel (PLAVIX) 75 MG tablet TAKE 1 TABLET (75 MG TOTAL) BY MOUTH DAILY.   ezetimibe (ZETIA) 10 MG tablet TAKE 1 TABLET (10 MG TOTAL) BY MOUTH DAILY.   gabapentin (NEURONTIN) 100 MG capsule Take 100 mg by mouth 3 (three) times daily.   isosorbide mononitrate (IMDUR) 30 MG 24 hr tablet TAKE ONE a day   losartan (COZAAR) 25 MG tablet Take 0.5 tablets (12.5 mg total) by mouth daily.   mirtazapine (REMERON) 15 MG tablet  TAKE 1 TABLET BY MOUTH EVERYDAY AT BEDTIME   Multiple Vitamins-Minerals (MULTIVITAMIN WITH MINERALS) tablet Take 1 tablet by mouth daily.   nitroGLYCERIN (NITROSTAT) 0.4 MG SL tablet Place 1 tablet (0.4 mg total) under the tongue every 5 (five) minutes as needed for chest pain.   pantoprazole (PROTONIX) 40 MG tablet Take 1 tablet (40 mg total) by mouth 2 (two) times daily.   ranolazine (RANEXA) 500 MG 12 hr tablet Take 1 tablet (500 mg total) by mouth 2 (two) times daily.   rosuvastatin (CRESTOR) 40 MG tablet Take 1 tablet (40 mg total) by mouth daily.   tiZANidine (ZANAFLEX) 4 MG tablet Take 4 mg by mouth at bedtime.    Allergies:   Metoprolol tartrate, Quinolones, and Metoclopramide   Social History   Socioeconomic History   Marital status: Married    Spouse name: Not on file   Number of children: 3   Years of education: Not on file   Highest education level: Not on file  Occupational History   Occupation: Geneticist, molecular (Animal nutritionist)    Comment: Retired  Tobacco Use   Smoking status: Former    Packs/day: 3.00    Years: 25.00    Pack years: 75.00    Types: Cigarettes    Quit date: 05/28/1993    Years since quitting: 28.0   Smokeless tobacco: Never  Vaping Use   Vaping Use: Never used  Substance and Sexual Activity   Alcohol use: Yes    Alcohol/week: 42.0 standard drinks    Types: 42 Cans of beer per week   Drug use: No   Sexual activity: Not on file  Other Topics Concern   Not on file  Social History Narrative   No living will   No health care POA but requests wife--then children   Would accept resuscitation   Not sure about tube feeds   Social Determinants of Health   Financial Resource Strain: Not on file  Food Insecurity: Not on file  Transportation Needs: Not on file  Physical Activity: Not on file  Stress: Not on file  Social Connections: Not on file     Family History:  The patient's family history includes Coronary artery disease in his  brother, father, mother, and sister; Heart attack in his father, maternal grandfather, and mother; Hypertension in his mother. There is no history of Diabetes or Cancer.  ROS:   Review of Systems  Constitutional:  Positive for malaise/fatigue. Negative for chills, diaphoresis, fever  and weight loss.  HENT:  Negative for congestion.   Eyes:  Negative for discharge and redness.  Respiratory:  Positive for shortness of breath. Negative for cough, sputum production and wheezing.   Cardiovascular:  Negative for chest pain, palpitations, orthopnea, claudication, leg swelling and PND.  Gastrointestinal:  Negative for abdominal pain, blood in stool, heartburn, melena, nausea and vomiting.  Musculoskeletal:  Negative for falls and myalgias.  Skin:  Negative for rash.  Neurological:  Negative for dizziness, tingling, tremors, sensory change, speech change, focal weakness, loss of consciousness and weakness.  Endo/Heme/Allergies:  Does not bruise/bleed easily.  Psychiatric/Behavioral:  Negative for substance abuse. The patient is not nervous/anxious.   All other systems reviewed and are negative.   EKGs/Labs/Other Studies Reviewed:    Studies reviewed were summarized above. The additional studies were reviewed today:  2D echo 05/14/2021: 1. Akinesis of the basal inferior, inferolateral and inferoseptal walls  with overall mild LV dysfunction.   2. Left ventricular ejection fraction, by estimation, is 45 to 50%. The  left ventricle has mildly decreased function. The left ventricle  demonstrates regional wall motion abnormalities (see scoring  diagram/findings for description). Left ventricular  diastolic parameters are consistent with Grade I diastolic dysfunction  (impaired relaxation).   3. Right ventricular systolic function is normal. The right ventricular  size is normal.   4. The mitral valve is normal in structure. No evidence of mitral valve  regurgitation. No evidence of mitral  stenosis.   5. The aortic valve has an indeterminant number of cusps. Aortic valve  regurgitation is not visualized. No aortic stenosis is present.   6. The inferior vena cava is normal in size with greater than 50%  respiratory variability, suggesting right atrial pressure of 3 mmHg. __________  LHC 05/13/2021:   Ost LM lesion is 45% stenosed.   Prox LAD-2 lesion is 100% stenosed.   Ost RCA to Dist RCA lesion is 100% stenosed.   Origin to Insertion lesion before Acute Mrg  is 100% stenosed.   Origin lesion is 100% stenosed.   Prox LAD-1 lesion is 99% stenosed.   Non-stenotic Mid LM to Dist LM lesion was previously treated.   Non-stenotic Ost Cx to Mid Cx lesion was previously treated.   Seq SVG- AVM-PDA due to known occlusion.   LIMA and is normal in caliber.   SVG due to known occlusion.   The graft exhibits no disease.   LV end diastolic pressure is normal.   1.  High-grade proximal LAD lesion which perfuses a large septal; the LIMA to the LAD does not backfill just septal.  Failed PCI due to inability to cross and likely represents a chronic total occlusion. 2.  Moderate in-stent restenosis of proximal left circumflex with RFR of 0.92; PCI was deferred. 3.  Sequential vein graft to OM to PDA, vein graft to OM,, and native right coronary artery were not imaged as they are known occluded. 4.  LVEDP of 8 mmHg 5.  Absent left radial pulse; future cardiac catheterizations should pursue a right radial or femoral approaches depending on clinical context.   Commendation: Intensive medical therapy. __________  2D echo 02/26/2021: 1. Challenging images   2. Left ventricular ejection fraction, by estimation, is 50 to 55%. The  left ventricle has low normal function. The left ventricle demonstrates  regional wall motion abnormalities (Basal to mid inferior and basal  inferolateral wall hypokinesis). Left  ventricular diastolic parameters are consistent with Grade I diastolic  dysfunction  (impaired  relaxation).   3. Right ventricular systolic function is normal. The right ventricular  size is normal.   4. The mitral valve is normal in structure. Mild mitral valve  regurgitation. No evidence of mitral stenosis.   5. The aortic valve is normal in structure. Trileaflet. Aortic valve  regurgitation is not visualized. Mild aortic valve sclerosis is present,  with no evidence of aortic valve stenosis.   6. There is borderline dilatation of the aortic root, measuring 36 mm.  There is borderline dilatation of the ascending aorta, measuring 36 mm.   7. The inferior vena cava is normal in size with greater than 50%  respiratory variability, suggesting right atrial pressure of 3 mmHg.   Comparison(s): EF 50%, Akinesis of the basal inferior and basal  inferolateral myocardium; severe hypokinesis of the mid  inferior and mid inferolateral myocardium. __________  AAA ultrasound 02/26/2021: Summary:  Abdominal Aorta: There is evidence of abnormal dilatation of the distal  Abdominal aorta. There is evidence of abnormal dilation of the Right  Common Iliac artery and Left Common Iliac artery. The largest aortic  measurement is 4.6 cm. The largest aortic  diameter has increased compared to prior exam. Previous diameter  measurement was 4.2 cm obtained on 11/12/2019.  Stenosis:  Severe atherosclerosis noted throughout aorta and bilateral iliac  arteries. No evidence of stenosis seen.   IVC/Iliac: There is no evidence of thrombus involving the IVC.     *See table(s) above for measurements and observations.  Suggest follow up study in 12 months.  __________  Carotid artery ultrasound 02/04/2020: Summary:  Right Carotid: Velocities in the right ICA are consistent with a 40-59% stenosis. Non-hemodynamically significant plaque <50% noted in the CCA.   Left Carotid: Velocities in the left ICA are consistent with a 40-59%  stenosis. Non-hemodynamically significant plaque <50% noted in  the  CCA.   Vertebrals:  Bilateral vertebral arteries demonstrate antegrade flow.  Subclavians: Bilateral subclavian arteries were stenotic. Bilateral  subclavian artery flow was disturbed.   *See table(s) above for measurements and observations.  Suggest follow up study in 12 months.  __________  2D echo 10/29/2017: Study Conclusions   - Left ventricle: The cavity size was normal. Wall thickness was    increased in a pattern of mild LVH. Systolic function was at the    lower limits of normal. The estimated ejection fraction was 50%.    Doppler parameters are consistent with abnormal left ventricular    relaxation (grade 1 diastolic dysfunction).  - Regional wall motion abnormality: Akinesis of the basal inferior    and basal inferolateral myocardium; severe hypokinesis of the mid    inferior and mid inferolateral myocardium.  - Aortic valve: Trileaflet; mildly calcified leaflets. There was no    stenosis.  - Right ventricle: Systolic function was mildly reduced. __________  LHC 10/28/2017: There is mild to moderate left ventricular systolic dysfunction. The LVEF was 45-50% by visual estimate basal inferior -aneurysmal akinesis. LV end diastolic pressure is normal. _________________________________________________________________________________________ Colon Flattery LM lesion is 45% stenosed - just prior to stent. Previously placed Mid LM to Dist LM stent (unknown type) is widely patent - the stent continues into the pCx. Previously placed Ost Cx to Mid Cx stents (unknown type -several overlapping BMS and DES) are widely patent. Prox LAD lesion is 100% stenosed = just after large branching SP 1 LIMA-LAD graft was visualized by angiography and is normal in caliber. The graft exhibits no disease. Ost RCA to Dist RCA lesion is 100%  stenosed Seq SVG- AVM-PDA graft was not visualized due to known occlusion. SVG-OM graft was not visualized due to known occlusion.   RELATIVELY STABLE CAD FROM  2015 Known Severe Multi-vessel CAD: known CTO of ostRCA, mLAD & SVG-AM-PDA, SVG-OM. Widely patent LIMA-LAD - retograde fills to 100% CTO, antegrade flow brisk to the Apex. Prox LM ~40% (stable lesion just prior to the stent) followed by widely patent stents running from pLM-ost-proxCx-OM with brisk flow in both the distal OM & AV Groove Cx --> collaterals to RPL system. Mldly reduced LVEF with basal-mid Inferior Akinesis. Normal/Low LVEDP.     PLAN: Return to nursing unit for ongoing care and TR band removal.   Post-cath hydration.   Evaluate for noncardiac cause for chest pain.   Recommend dual antiplatelet therapy with Aspirin 81mg  daily and Clopidogrel 75mg  daily long-term (beyond 12 months) because of extensive CAD & multiple overlapping stents in LM-Cx-OM.    EKG:  EKG is ordered today.  The EKG ordered today demonstrates NSR, 62 bpm, baseline artifact, rare PAC and PVC, nonspecific inferior ST-T changes  Recent Labs: 05/13/2021: ALT 23 05/14/2021: Magnesium 1.9 05/15/2021: BUN 10; Creatinine, Ser 0.79; Hemoglobin 11.6; Platelets 246; Potassium 3.4; Sodium 136  Recent Lipid Panel    Component Value Date/Time   CHOL 137 05/14/2021 0036   CHOL 163 08/11/2015 0941   TRIG 119 05/14/2021 0036   HDL 48 05/14/2021 0036   HDL 46 08/11/2015 0941   CHOLHDL 2.9 05/14/2021 0036   VLDL 24 05/14/2021 0036   LDLCALC 65 05/14/2021 0036   LDLCALC 63 01/07/2017 1120   LDLDIRECT 65.0 02/20/2021 1027    PHYSICAL EXAM:    VS:  BP (!) 148/92 (BP Location: Left Arm, Patient Position: Sitting, Cuff Size: Normal)    Pulse 62    Ht 5\' 4"  (1.626 m)    Wt 173 lb (78.5 kg)    SpO2 98%    BMI 29.70 kg/m   BMI: Body mass index is 29.7 kg/m.  Physical Exam Vitals reviewed.  Constitutional:      Appearance: He is well-developed.  HENT:     Head: Normocephalic and atraumatic.  Eyes:     General:        Right eye: No discharge.        Left eye: No discharge.  Neck:     Vascular: No JVD.   Cardiovascular:     Rate and Rhythm: Normal rate and regular rhythm.     Pulses:          Radial pulses are 1+ on the right side and 1+ on the left side.     Heart sounds: Normal heart sounds, S1 normal and S2 normal. Heart sounds not distant. No midsystolic click and no opening snap. No murmur heard.   No friction rub.     Comments: Right femoral arteriotomy site with mild ecchymosis distally without erythema, active bleeding, swelling, or tenderness to palpation.  No bruit.  Symptoms are improved when compared to what he was experiencing leading up to his most recent hospitalization.  On a nightly basis.  On a nightly basis.  On with significant lesion involving the LAD just prior to a septal branch that was unable to be crossed and possibly felt to be CTO.  Isosorbide.  Low-sodium diet recommended.  Recommend addition recommend addition of PCSK9 inhibitor.  Addition of MRA and SGLT2 I Pulmonary:     Effort: Pulmonary effort is normal. No respiratory distress.     Breath  sounds: Normal breath sounds. No decreased breath sounds, wheezing or rales.  Chest:     Chest wall: No tenderness.  Abdominal:     General: There is no distension.     Palpations: Abdomen is soft.     Tenderness: There is no abdominal tenderness.  Musculoskeletal:     Cervical back: Normal range of motion.     Right lower leg: No edema.     Left lower leg: No edema.  Skin:    General: Skin is warm and dry.     Nails: There is no clubbing.  Neurological:     Mental Status: He is alert and oriented to person, place, and time.  Psychiatric:        Speech: Speech normal.        Behavior: Behavior normal.        Thought Content: Thought content normal.        Judgment: Judgment normal.    Wt Readings from Last 3 Encounters:  05/29/21 173 lb (78.5 kg)  05/15/21 163 lb 9.6 oz (74.2 kg)  03/17/21 169 lb (76.7 kg)     ASSESSMENT & PLAN:   CAD status post CABG with stable angina and chronic dyspnea: Recently  admitted with an NSTEMI with LHC showing a significant LAD lesion proximal to a large septal branch.  Lesion was unable to be crossed, possibly being CTO.  He has been without frank angina since his hospital discharge and reports chronic stable dyspnea.  I suspect his dyspnea is multifactorial including underlying CAD, cardiomyopathy, and possible COPD with history of prior tobacco use.  Continue aggressive risk factor modification and secondary prevention including aspirin, clopidogrel, carvedilol, isosorbide mononitrate, ranolazine, and rosuvastatin.  No cath site complications.  No indication for further ischemic testing at this time.  HFrEF secondary to ICM: He appears euvolemic and well compensated.  In an effort to further optimize GDMT we will add losartan 12.5 mg daily with a follow-up BMP in 1 week.  He will otherwise continue carvedilol 3.125 mg twice daily.  Heart rate precludes dose escalation of beta-blocker at this time.  When he is seen in follow-up, recommend further optimization of GDMT with possible transition of ARB to ARNI along with MRA and SGLT2i as vital signs and labs allow.  We will need to be cautious with escalation of medical therapy given his history of soft blood pressure in the past, which has previously led to the discontinuation of ACE inhibitor.  Precautions were discussed in the office today.  Not currently requiring a standing loop diuretic.  CHF education.  AAA/PAD: Largest aortic measurement measuring 4.6 cm in 02/2021 which had increased from prior exam measuring 4.2 cm in 10/2019.  Recommendation was to follow-up imaging in 12 months.  He is now followed by vascular surgery for this.  Recommend optimal blood pressure and lipid control.  No longer smoking.  He remains on antiplatelet therapy and statin as outlined above.  ABIs in 10/2020 showed moderate left lower extremity disease.  No lifestyle limiting claudication.  HTN: Blood pressure is mildly elevated in the office  today.  Add losartan 12.5 mg daily.  He will otherwise continue carvedilol and isosorbide.  Low-sodium diet is encouraged.  HLD: LDL 65 with goal being less than 50 he remains on rosuvastatin 40 mg and Zetia 10 mg.  In several months time recommend obtaining a fasting lipid panel and LFT.  If LDL remains above goal at that time, recommend addition of PCSK9i.  Possible  COPD with history of tobacco use: No acute exacerbation.  Follow-up with PCP as directed.   Disposition: F/u with Dr. Rockey Situ or an APP in 1 month.   Medication Adjustments/Labs and Tests Ordered: Current medicines are reviewed at length with the patient today.  Concerns regarding medicines are outlined above. Medication changes, Labs and Tests ordered today are summarized above and listed in the Patient Instructions accessible in Encounters.   Signed, Christell Faith, PA-C 05/29/2021 12:32 PM     [facility] [address] [place] [address] [place], [place] [address] [phone]

## 2021-05-28 ENCOUNTER — Encounter: Payer: Medicare Other | Attending: Cardiovascular Disease

## 2021-05-28 ENCOUNTER — Other Ambulatory Visit: Payer: Self-pay

## 2021-05-28 DIAGNOSIS — I214 Non-ST elevation (NSTEMI) myocardial infarction: Secondary | ICD-10-CM | POA: Insufficient documentation

## 2021-05-28 DIAGNOSIS — Z5189 Encounter for other specified aftercare: Secondary | ICD-10-CM | POA: Insufficient documentation

## 2021-05-28 NOTE — Progress Notes (Signed)
Virtual Visit completed. Patient informed on EP and RD appointment and 6 Minute walk test. Patient also informed of patient health questionnaires on My Chart. Patient Verbalizes understanding. Visit diagnosis can be found in [facility] 05/13/2020.

## 2021-05-29 ENCOUNTER — Encounter: Payer: Self-pay | Admitting: Physician Assistant

## 2021-05-29 ENCOUNTER — Ambulatory Visit (INDEPENDENT_AMBULATORY_CARE_PROVIDER_SITE_OTHER): Payer: Medicare Other | Admitting: Physician Assistant

## 2021-05-29 VITALS — BP 148/92 | HR 62 | Ht 64.0 in | Wt 173.0 lb

## 2021-05-29 DIAGNOSIS — I5020 Unspecified systolic (congestive) heart failure: Secondary | ICD-10-CM | POA: Diagnosis not present

## 2021-05-29 DIAGNOSIS — Z79899 Other long term (current) drug therapy: Secondary | ICD-10-CM

## 2021-05-29 DIAGNOSIS — E785 Hyperlipidemia, unspecified: Secondary | ICD-10-CM

## 2021-05-29 DIAGNOSIS — R0609 Other forms of dyspnea: Secondary | ICD-10-CM | POA: Diagnosis not present

## 2021-05-29 DIAGNOSIS — I25708 Atherosclerosis of coronary artery bypass graft(s), unspecified, with other forms of angina pectoris: Secondary | ICD-10-CM | POA: Diagnosis not present

## 2021-05-29 DIAGNOSIS — I10 Essential (primary) hypertension: Secondary | ICD-10-CM

## 2021-05-29 DIAGNOSIS — I255 Ischemic cardiomyopathy: Secondary | ICD-10-CM | POA: Diagnosis not present

## 2021-05-29 DIAGNOSIS — I7143 Infrarenal abdominal aortic aneurysm, without rupture: Secondary | ICD-10-CM | POA: Diagnosis not present

## 2021-05-29 MED ORDER — LOSARTAN POTASSIUM 25 MG PO TABS: 12.5000 mg | ORAL_TABLET | Freq: Every day | ORAL | 3 refills | Status: DC

## 2021-05-29 NOTE — Patient Instructions (Signed)
Medication Instructions:  - Your physician has recommended you make the following change in your medication:   1) START losartan 25 mg: - take 0.5 tablet (12.5 mg) by mouth once daily   *If you need a refill on your cardiac medications before your next appointment, please call your pharmacy*   Lab Work: - Your physician recommends that you return for lab work in: 1 week- BMP  If you have labs (blood work) drawn today and your tests are completely normal, you will receive your results only by: [place] (if you have MyChart) OR A paper copy in the mail If you have any lab test that is abnormal or we need to change your treatment, we will call you to review the results.   Testing/Procedures: - none ordered   Follow-Up: At [facility], you and your health needs are our priority.  As part of our continuing mission to provide you with exceptional heart care, we have created designated Provider Care Teams.  These Care Teams include your primary Cardiologist (physician) and Advanced Practice Providers (APPs -  Physician Assistants and Nurse Practitioners) who all work together to provide you with the care you need, when you need it.  We recommend signing up for the patient portal called "MyChart".  Sign up information is provided on this After Visit Summary.  MyChart is used to connect with patients for Virtual Visits (Telemedicine).  Patients are able to view lab/test results, encounter notes, upcoming appointments, etc.  Non-urgent messages can be sent to your provider as well.   To learn more about what you can do with MyChart, go to [email or web address].    Your next appointment:   1 month(s)  The format for your next appointment:   In Person  Provider:   You may see Ida Rogue, MD or one of the following Advanced Practice Providers on your designated Care Team:    Christell Faith, PA-C    Other Instructions  Losartan Tablets What is this medication? LOSARTAN (loe  SAR tan) treats high blood pressure. It may also be used to prevent a stroke in people with heart disease and high blood pressure. It can be used to prevent kidney damage in people with diabetes. It works by relaxing the blood vessels, which helps decrease the amount of work your heart has to do. It belongs to a group of medications called ARBs. This medicine may be used for other purposes; ask your health care provider or pharmacist if you have questions. COMMON BRAND NAME(S): Cozaar What should I tell my care team before I take this medication? They need to know if you have any of these conditions: Heart failure Kidney disease Liver disease An unusual or allergic reaction to losartan, other medications, foods, dyes, or preservatives Pregnant or trying to get pregnant Breast-feeding How should I use this medication? Take this medication by mouth. Take it as directed on the prescription label at the same time every day. You can take it with or without food. If it upsets your stomach, take it with food. Keep taking it unless your care team tells you to stop. Talk to your care team about the use of this medication in children. While it may be prescribed for children as young as 6 for selected conditions, precautions do apply. Overdosage: If you think you have taken too much of this medicine contact a poison control center or emergency room at once. NOTE: This medicine is only for you. Do not share this medicine with others.  What if I miss a dose? If you miss a dose, take it as soon as you can. If it is almost time for your next dose, take only that dose. Do not take double or extra doses. What may interact with this medication? Aliskiren ACE inhibitors, like enalapril or lisinopril Diuretics, especially amiloride, eplerenone, spironolactone, or triamterene Lithium NSAIDs, medications for pain and inflammation, like ibuprofen or naproxen Potassium salts or potassium supplements This list may  not describe all possible interactions. Give your health care provider a list of all the medicines, herbs, non-prescription drugs, or dietary supplements you use. Also tell them if you smoke, drink alcohol, or use illegal drugs. Some items may interact with your medicine. What should I watch for while using this medication? Visit your care team for regular check ups. Check your blood pressure as directed. Ask your care team what your blood pressure should be. Also, find out when you should contact them. Do not treat yourself for coughs, colds, or pain while you are using this medication without asking your care team for advice. Some medications may increase your blood pressure. Women should inform their care team if they wish to become pregnant or think they might be pregnant. There is a potential for serious side effects to an unborn child. Talk to your care team for more information. You may get drowsy or dizzy. Do not drive, use machinery, or do anything that needs mental alertness until you know how this medication affects you. Do not stand or sit up quickly, especially if you are an older patient. This reduces the risk of dizzy or fainting spells. Alcohol can make you more drowsy and dizzy. Avoid alcoholic drinks. Avoid salt substitutes unless you are told otherwise by your care team. What side effects may I notice from receiving this medication? Side effects that you should report to your care team as soon as possible: Allergic reactions--skin rash, itching, hives, swelling of the face, lips, tongue, or throat High potassium level--muscle weakness, fast or irregular heartbeat Kidney injury--decrease in the amount of urine, swelling of the ankles, hands, or feet Low blood pressure--dizziness, feeling faint or lightheaded, blurry vision Side effects that usually do not require medical attention (report to your care team if they continue or are bothersome): Dizziness Headache Runny or stuffy  nose This list may not describe all possible side effects. Call your doctor for medical advice about side effects. You may report side effects to FDA at 1-800-FDA-1088. Where should I keep my medication? Keep out of the reach of children and pets. Store at room temperature between 20 and 25 degrees C (68 and 77 degrees F). Protect from light. Keep the container tightly closed. Get rid of any unused medication after the expiration date. To get rid of medications that are no longer needed or have expired: Take the medication to a medication take-back program. Check with your pharmacy or law enforcement to find a location. If you cannot return the medication, check the label or package insert to see if the medication should be thrown out in the garbage or flushed down the toilet. If you are not sure, ask your care team. If it is safe to put in the trash, empty the medication out of the container. Mix the medication with cat litter, dirt, coffee grounds, or other unwanted substance. Seal the mixture in a bag or container. Put it in the trash. NOTE: This sheet is a summary. It may not cover all possible information. If you have questions  about this medicine, talk to your doctor, pharmacist, or health care provider.  2022 [employer] Standard (2020-12-30 00:00:00)

## 2021-06-04 ENCOUNTER — Other Ambulatory Visit: Payer: Self-pay | Admitting: Physician Assistant

## 2021-06-04 ENCOUNTER — Other Ambulatory Visit (INDEPENDENT_AMBULATORY_CARE_PROVIDER_SITE_OTHER): Payer: Medicare Other

## 2021-06-04 ENCOUNTER — Other Ambulatory Visit: Payer: Self-pay

## 2021-06-04 ENCOUNTER — Telehealth: Payer: Self-pay | Admitting: Internal Medicine

## 2021-06-04 ENCOUNTER — Other Ambulatory Visit: Payer: Self-pay | Admitting: Cardiovascular Disease

## 2021-06-04 DIAGNOSIS — Z79899 Other long term (current) drug therapy: Secondary | ICD-10-CM

## 2021-06-04 DIAGNOSIS — I10 Essential (primary) hypertension: Secondary | ICD-10-CM | POA: Diagnosis not present

## 2021-06-04 MED ORDER — PANTOPRAZOLE SODIUM 40 MG PO TBEC: 40.0000 mg | DELAYED_RELEASE_TABLET | Freq: Two times a day (BID) | ORAL | 3 refills | Status: DC

## 2021-06-04 MED ORDER — RANOLAZINE ER 500 MG PO TB12: 500.0000 mg | ORAL_TABLET | Freq: Two times a day (BID) | ORAL | 3 refills | Status: DC

## 2021-06-04 NOTE — Telephone Encounter (Signed)
Rx sent electronically.  

## 2021-06-04 NOTE — Telephone Encounter (Signed)
°  Encourage patient to contact the pharmacy for refills or they can request refills through [place]:  Please schedule appointment if longer than 1 year  NEXT APPOINTMENT DATE: 06/10/21  MEDICATION: ranolazine (RANEXA) 500 MG 12 hr tablet pantoprazole (PROTONIX) 40 MG tablet  Is the patient out of medication? yes  PHARMACY: [facility]  Let patient know to contact pharmacy at the end of the day to make sure medication is ready.  Please notify patient to allow 48-72 hours to process  CLINICAL FILLS OUT ALL BELOW:   LAST REFILL:  QTY:  REFILL DATE:    OTHER COMMENTS:    Okay for refill?  Please advise

## 2021-06-04 NOTE — Addendum Note (Signed)
Addended by: Pilar Grammes on: 06/04/2021 01:28 PM   Modules accepted: Orders

## 2021-06-04 NOTE — Telephone Encounter (Signed)
Refills were sent to [facility]. They will not transfer them. Will need to send to Dr Silvio Pate to approve the Ranexa.

## 2021-06-05 LAB — BASIC METABOLIC PANEL
BUN/Creatinine Ratio: 8 — ABNORMAL LOW (ref 10–24)
BUN: 6 mg/dL — ABNORMAL LOW (ref 8–27)
CO2: 22 mmol/L (ref 20–29)
Calcium: 9.6 mg/dL (ref 8.6–10.2)
Chloride: 94 mmol/L — ABNORMAL LOW (ref 96–106)
Creatinine, Ser: 0.77 mg/dL (ref 0.76–1.27)
Glucose: 110 mg/dL — ABNORMAL HIGH (ref 70–99)
Potassium: 4.6 mmol/L (ref 3.5–5.2)
Sodium: 132 mmol/L — ABNORMAL LOW (ref 134–144)
eGFR: 96 mL/min/{1.73_m2} (ref >59)

## 2021-06-08 ENCOUNTER — Ambulatory Visit: Payer: Medicare Other | Admitting: Internal Medicine

## 2021-06-09 ENCOUNTER — Other Ambulatory Visit: Payer: Self-pay

## 2021-06-09 VITALS — Ht 64.75 in | Wt 170.5 lb

## 2021-06-09 DIAGNOSIS — Z5189 Encounter for other specified aftercare: Secondary | ICD-10-CM | POA: Diagnosis not present

## 2021-06-09 DIAGNOSIS — I214 Non-ST elevation (NSTEMI) myocardial infarction: Secondary | ICD-10-CM

## 2021-06-09 NOTE — Progress Notes (Signed)
Cardiac Individual Treatment Plan  Patient Details  Name: [name] MRN: [id number] Date of Birth: 1950-06-01 Referring Provider:   Flowsheet Row Cardiac Rehab from 06/09/2021 in [facility] Cardiac and Pulmonary Rehab  Referring Provider [name]       Initial Encounter Date:  Flowsheet Row Cardiac Rehab from 06/09/2021 in [facility] Cardiac and Pulmonary Rehab  Date 06/09/21       Visit Diagnosis: NSTEMI (non-ST elevated myocardial infarction) [facility])  Patient's Home Medications on Admission:  Current Outpatient Medications:    aspirin 81 MG tablet, Take 81 mg by mouth daily., Disp: , Rfl:    BLACK ELDERBERRY PO, Take 1 each by mouth daily., Disp: , Rfl:    carvedilol (COREG) 3.125 MG tablet, TAKE 1 TABLET (3.125 MG TOTAL) BY MOUTH 2 (TWO) TIMES DAILY., Disp: 180 tablet, Rfl: 3   clopidogrel (PLAVIX) 75 MG tablet, TAKE 1 TABLET (75 MG TOTAL) BY MOUTH DAILY., Disp: 90 tablet, Rfl: 1   ezetimibe (ZETIA) 10 MG tablet, TAKE 1 TABLET (10 MG TOTAL) BY MOUTH DAILY., Disp: 90 tablet, Rfl: 1   gabapentin (NEURONTIN) 100 MG capsule, Take 100 mg by mouth 3 (three) times daily., Disp: , Rfl:    isosorbide mononitrate (IMDUR) 30 MG 24 hr tablet, TAKE ONE a day, Disp: 90 tablet, Rfl: 3   losartan (COZAAR) 25 MG tablet, TAKE 1/2 TABLET BY MOUTH EVERY DAY, Disp: 45 tablet, Rfl: 0   meclizine (ANTIVERT) 25 MG tablet, TAKE 1 TABLET BY MOUTH 3 TIMES DAILY AS NEEDED FOR DIZZINESS. (Patient not taking: Reported on 05/28/2021), Disp: 30 tablet, Rfl: 2   mirtazapine (REMERON) 15 MG tablet, TAKE 1 TABLET BY MOUTH EVERYDAY AT BEDTIME, Disp: 90 tablet, Rfl: 3   Multiple Vitamins-Minerals (MULTIVITAMIN WITH MINERALS) tablet, Take 1 tablet by mouth daily., Disp: , Rfl:    nitroGLYCERIN (NITROSTAT) 0.4 MG SL tablet, Place 1 tablet (0.4 mg total) under the tongue every 5 (five) minutes as needed for chest pain., Disp: 25 tablet, Rfl: 2   pantoprazole (PROTONIX) 40 MG tablet, Take 1 tablet (40 mg total) by mouth  2 (two) times daily., Disp: 180 tablet, Rfl: 3   ranolazine (RANEXA) 500 MG 12 hr tablet, Take 1 tablet (500 mg total) by mouth 2 (two) times daily., Disp: 180 tablet, Rfl: 3   rosuvastatin (CRESTOR) 40 MG tablet, TAKE 1 TABLET BY MOUTH EVERY DAY, Disp: 90 tablet, Rfl: 3   tiZANidine (ZANAFLEX) 4 MG tablet, Take 4 mg by mouth at bedtime., Disp: , Rfl:    traMADol (ULTRAM) 50 MG tablet, as needed. (Patient not taking: Reported on 05/13/2021), Disp: , Rfl:   Past Medical History: Past Medical History:  Diagnosis Date   AAA (abdominal aortic aneurysm)    a. Duplex 01/2012: stable infrarenal saccular AAA at 3.3cm x 3.2xm (f/u recommended 01/2013 per Dr. Rockey Situ)   Alcohol abuse    CAD (coronary artery disease)    a. 1995 s/p CABG x 4: LIMA->LAD, VG->RI (known to be occluded), VG->AM->PDA;  b. 05/2002 Inf STEMI: VG->AM->PDA 100% treated w/ 2 BMS complicated by acute thrombosis req 3 BMS;  c. 07/2003 DES to  LAD & LCX (VG's to PDA & RI 100%);  d. 12/2010 Acute MI ([place]): DES to LCX & LM , LIMA ok,;  e.12/2011 Cath: LM/LCX stents ok , LIMA patent.   Cardiomyopathy ([place])    a. EF 35% by cath 2015.   Diverticulitis    1/06 Diverticulitis--CT of pelvis--diffuse sigmoid divertic   Dyspnea  GERD (gastroesophageal reflux disease)    Hyperlipidemia    Hypertension    Myocardial infarction [facility])    PAD (peripheral artery disease) ([place])    a. external iliac and mesenteric stenosis noted by noninvasive imaging.   Renal artery stenosis ([place])    a. 03/2011 PTA and stenting of L RA. b. last duplex 2016 with stable 1-59% bilateral RAS, incidental >50% R EIA stenosis    Tobacco Use: Social History   Tobacco Use  Smoking Status Former   Packs/day: 3.00   Years: 25.00   Pack years: 75.00   Types: Cigarettes   Quit date: 05/28/1993   Years since quitting: 28.0  Smokeless Tobacco Never    Labs: Recent Review Flowsheet Data     Labs for ITP Cardiac and Pulmonary Rehab Latest Ref Rng & Units 10/29/2017  10/19/2018 02/20/2021 05/13/2021 05/14/2021   Cholestrol 0 - 200 mg/dL 118 169 138 - 137   LDLCALC 0 - 99 mg/dL 24 - - - 65   LDLDIRECT mg/dL - 62.0 65.0 - -   HDL >40 mg/dL 31(L) 44.30 52.20 - 48   Trlycerides <150 mg/dL 316(H) 492.0 Triglyceride is over 400; calculations on Lipids are invalid.(H) 206.0(H) - 119   Hemoglobin A1c 4.8 - 5.6 % - - - - 6.8(H)   TCO2 22 - 32 mmol/L - - - 25 -        Exercise Target Goals: Exercise Program Goal: Individual exercise prescription set using results from initial 6 min walk test and THRR while considering  patients activity barriers and safety.   Exercise Prescription Goal: Initial exercise prescription builds to 30-45 minutes a day of aerobic activity, 2-3 days per week.  Home exercise guidelines will be given to patient during program as part of exercise prescription that the participant will acknowledge.   Education: Aerobic Exercise: - Group verbal and visual presentation on the components of exercise prescription. Introduces F.I.T.T principle from ACSM for exercise prescriptions.  Reviews F.I.T.T. principles of aerobic exercise including progression. Written material given at graduation.   Education: Resistance Exercise: - Group verbal and visual presentation on the components of exercise prescription. Introduces F.I.T.T principle from ACSM for exercise prescriptions  Reviews F.I.T.T. principles of resistance exercise including progression. Written material given at graduation.    Education: Exercise & Equipment Safety: - Individual verbal instruction and demonstration of equipment use and safety with use of the equipment. Flowsheet Row Cardiac Rehab from 05/28/2021 in [facility] Cardiac and Pulmonary Rehab  Date 05/28/21  Educator [facility]  Instruction Review Code 1- Verbalizes Understanding       Education: Exercise Physiology & General Exercise Guidelines: - Group verbal and written instruction with models to review the exercise physiology of  the cardiovascular system and associated critical values. Provides general exercise guidelines with specific guidelines to those with heart or lung disease.  Flowsheet Row Cardiac Rehab from 06/09/2021 in [facility] Cardiac and Pulmonary Rehab  Education need identified 06/09/21       Education: Flexibility, Balance, Mind/Body Relaxation: - Group verbal and visual presentation with interactive activity on the components of exercise prescription. Introduces F.I.T.T principle from ACSM for exercise prescriptions. Reviews F.I.T.T. principles of flexibility and balance exercise training including progression. Also discusses the mind body connection.  Reviews various relaxation techniques to help reduce and manage stress (i.e. Deep breathing, progressive muscle relaxation, and visualization). Balance handout provided to take home. Written material given at graduation.   Activity Barriers & Risk Stratification:  Activity Barriers & Cardiac Risk Stratification -  06/09/21 1056       Activity Barriers & Cardiac Risk Stratification   Activity Barriers Shortness of Breath;Other (comment);Deconditioning    Comments Sciatica    Cardiac Risk Stratification High             6 Minute Walk:  6 Minute Walk     Row Name 06/09/21 1059         6 Minute Walk   Phase Initial     Distance 1055 feet     Walk Time 6 minutes     # of Rest Breaks 0     MPH 1.98     METS 2.42     RPE 13     Perceived Dyspnea  2     VO2 Peak 8.48     Symptoms Yes (comment)     Comments SOB     Resting HR 65 bpm     Resting BP 132/82     Resting Oxygen Saturation  97 %     Exercise Oxygen Saturation  during 6 min walk 98 %     Max Ex. HR 91 bpm     Max Ex. BP 152/82     2 Minute Post BP 136/76              Oxygen Initial Assessment:   Oxygen Re-Evaluation:   Oxygen Discharge (Final Oxygen Re-Evaluation):   Initial Exercise Prescription:  Initial Exercise Prescription - 06/09/21 1000       Date of  Initial Exercise RX and Referring Provider   Date 06/09/21    Referring Provider [name]      Oxygen   Maintain Oxygen Saturation 88% or higher      Treadmill   MPH 1.7    Grade 0    Minutes 15    METs 2.3      Recumbant Bike   Level 1    RPM 60    Minutes 15    METs 2.4      NuStep   Level 1    SPM 80    Minutes 15    METs 2.4      Track   Laps 23    Minutes 15    METs 2.25      Prescription Details   Frequency (times per week) 3    Duration Progress to 30 minutes of continuous aerobic without signs/symptoms of physical distress      Intensity   THRR 40-80% of Max Heartrate 99 - 133    Ratings of Perceived Exertion 11-13    Perceived Dyspnea 0-4      Progression   Progression Continue to progress workloads to maintain intensity without signs/symptoms of physical distress.      Resistance Training   Training Prescription Yes    Weight 4 lb    Reps 10-15             Perform Capillary Blood Glucose checks as needed.  Exercise Prescription Changes:   Exercise Prescription Changes     Row Name 06/09/21 1000             Response to Exercise   Blood Pressure (Admit) 132/82       Blood Pressure (Exercise) 152/82       Blood Pressure (Exit) 136/76       Heart Rate (Admit) 65 bpm       Heart Rate (Exercise) 91 bpm       Heart Rate (Exit) 64  bpm       Oxygen Saturation (Admit) 97 %       Oxygen Saturation (Exercise) 98 %       Rating of Perceived Exertion (Exercise) 13       Perceived Dyspnea (Exercise) 2       Symptoms SOB       Comments walk test results                Exercise Comments:   Exercise Goals and Review:   Exercise Goals     Row Name 06/09/21 1059             Exercise Goals   Increase Physical Activity Yes       Intervention Provide advice, education, support and counseling about physical activity/exercise needs.;Develop an individualized exercise prescription for aerobic and resistive training based on  initial evaluation findings, risk stratification, comorbidities and participant's personal goals.       Expected Outcomes Short Term: Attend rehab on a regular basis to increase amount of physical activity.;Long Term: Add in home exercise to make exercise part of routine and to increase amount of physical activity.;Long Term: Exercising regularly at least 3-5 days a week.       Increase Strength and Stamina Yes       Intervention Provide advice, education, support and counseling about physical activity/exercise needs.;Develop an individualized exercise prescription for aerobic and resistive training based on initial evaluation findings, risk stratification, comorbidities and participant's personal goals.       Expected Outcomes Short Term: Increase workloads from initial exercise prescription for resistance, speed, and METs.;Short Term: Perform resistance training exercises routinely during rehab and add in resistance training at home;Long Term: Improve cardiorespiratory fitness, muscular endurance and strength as measured by increased METs and functional capacity (6MWT)       Able to understand and use rate of perceived exertion (RPE) scale Yes       Intervention Provide education and explanation on how to use RPE scale       Expected Outcomes Short Term: Able to use RPE daily in rehab to express subjective intensity level;Long Term:  Able to use RPE to guide intensity level when exercising independently       Able to understand and use Dyspnea scale Yes       Intervention Provide education and explanation on how to use Dyspnea scale       Expected Outcomes Short Term: Able to use Dyspnea scale daily in rehab to express subjective sense of shortness of breath during exertion;Long Term: Able to use Dyspnea scale to guide intensity level when exercising independently       Knowledge and understanding of Target Heart Rate Range (THRR) Yes       Intervention Provide education and explanation of THRR  including how the numbers were predicted and where they are located for reference       Expected Outcomes Short Term: Able to state/look up THRR;Long Term: Able to use THRR to govern intensity when exercising independently;Short Term: Able to use daily as guideline for intensity in rehab       Able to check pulse independently Yes       Intervention Provide education and demonstration on how to check pulse in carotid and radial arteries.;Review the importance of being able to check your own pulse for safety during independent exercise       Expected Outcomes Long Term: Able to check pulse independently and accurately;Short Term: Able to explain why  pulse checking is important during independent exercise       Understanding of Exercise Prescription Yes       Intervention Provide education, explanation, and written materials on patient's individual exercise prescription       Expected Outcomes Short Term: Able to explain program exercise prescription;Long Term: Able to explain home exercise prescription to exercise independently                Exercise Goals Re-Evaluation :   Discharge Exercise Prescription (Final Exercise Prescription Changes):  Exercise Prescription Changes - 06/09/21 1000       Response to Exercise   Blood Pressure (Admit) 132/82    Blood Pressure (Exercise) 152/82    Blood Pressure (Exit) 136/76    Heart Rate (Admit) 65 bpm    Heart Rate (Exercise) 91 bpm    Heart Rate (Exit) 64 bpm    Oxygen Saturation (Admit) 97 %    Oxygen Saturation (Exercise) 98 %    Rating of Perceived Exertion (Exercise) 13    Perceived Dyspnea (Exercise) 2    Symptoms SOB    Comments walk test results             Nutrition:  Target Goals: Understanding of nutrition guidelines, daily intake of sodium 1500mg , cholesterol 200mg , calories 30% from fat and 7% or less from saturated fats, daily to have 5 or more servings of fruits and vegetables.  Education: All About  Nutrition: -Group instruction provided by verbal, written material, interactive activities, discussions, models, and posters to present general guidelines for heart healthy nutrition including fat, fiber, MyPlate, the role of sodium in heart healthy nutrition, utilization of the nutrition label, and utilization of this knowledge for meal planning. Follow up email sent as well. Written material given at graduation. Flowsheet Row Cardiac Rehab from 06/09/2021 in [facility] Cardiac and Pulmonary Rehab  Education need identified 06/09/21       Biometrics:  Pre Biometrics - 06/09/21 1055       Pre Biometrics   Height 5' 4.75" (1.645 m)    Weight 170 lb 8 oz (77.3 kg)    BMI (Calculated) 28.58    Single Leg Stand 3.7 seconds              Nutrition Therapy Plan and Nutrition Goals:  Nutrition Therapy & Goals - 06/09/21 1025       Intervention Plan   Intervention Prescribe, educate and counsel regarding individualized specific dietary modifications aiming towards targeted core components such as weight, hypertension, lipid management, diabetes, heart failure and other comorbidities.    Expected Outcomes Short Term Goal: Understand basic principles of dietary content, such as calories, fat, sodium, cholesterol and nutrients.;Short Term Goal: A plan has been developed with personal nutrition goals set during dietitian appointment.;Long Term Goal: Adherence to prescribed nutrition plan.             Nutrition Assessments:  MEDIFICTS Score Key: ?70 Need to make dietary changes  40-70 Heart Healthy Diet ? 40 Therapeutic Level Cholesterol Diet  Flowsheet Row Cardiac Rehab from 06/09/2021 in [facility] Cardiac and Pulmonary Rehab  Picture Your Plate Total Score on Admission 73      Picture Your Plate Scores: <01 Unhealthy dietary pattern with much room for improvement. 41-50 Dietary pattern unlikely to meet recommendations for good health and room for improvement. 51-60 More healthful dietary  pattern, with some room for improvement.  >60 Healthy dietary pattern, although there may be some specific behaviors that could be improved.  Nutrition Goals Re-Evaluation:   Nutrition Goals Discharge (Final Nutrition Goals Re-Evaluation):   Psychosocial: Target Goals: Acknowledge presence or absence of significant depression and/or stress, maximize coping skills, provide positive support system. Participant is able to verbalize types and ability to use techniques and skills needed for reducing stress and depression.   Education: Stress, Anxiety, and Depression - Group verbal and visual presentation to define topics covered.  Reviews how body is impacted by stress, anxiety, and depression.  Also discusses healthy ways to reduce stress and to treat/manage anxiety and depression.  Written material given at graduation. Flowsheet Row Cardiac Rehab from 06/09/2021 in [facility] Cardiac and Pulmonary Rehab  Education need identified 06/09/21       Education: Sleep Hygiene -Provides group verbal and written instruction about how sleep can affect your health.  Define sleep hygiene, discuss sleep cycles and impact of sleep habits. Review good sleep hygiene tips.    Initial Review & Psychosocial Screening:  Initial Psych Review & Screening - 05/28/21 1013       Initial Review   Current issues with Current Psychotropic Meds;Current Sleep Concerns      Family Dynamics   Good Support System? Yes    Comments Patient may have a bit of anxiety. He is sleeping better now that he is back home. He can look to his wife, son that lives her and two sons that live in [place]. He has done Cardiac rehab back in 1995.      Barriers   Psychosocial barriers to participate in program The patient should benefit from training in stress management and relaxation.      Screening Interventions   Interventions Encouraged to exercise;To provide support and resources with identified psychosocial needs;Provide  feedback about the scores to participant    Expected Outcomes Short Term goal: Utilizing psychosocial counselor, staff and physician to assist with identification of specific Stressors or current issues interfering with healing process. Setting desired goal for each stressor or current issue identified.;Long Term Goal: Stressors or current issues are controlled or eliminated.;Short Term goal: Identification and review with participant of any Quality of Life or Depression concerns found by scoring the questionnaire.;Long Term goal: The participant improves quality of Life and PHQ9 Scores as seen by post scores and/or verbalization of changes             Quality of Life Scores:   Quality of Life - 06/09/21 1027       Quality of Life   Select Quality of Life      Quality of Life Scores   Health/Function Pre 24.9 %    Socioeconomic Pre 20.94 %    Psych/Spiritual Pre 27.5 %    Family Pre 28.8 %    GLOBAL Pre 25.07 %            Scores of 19 and below usually indicate a poorer quality of life in these areas.  A difference of  2-3 points is a clinically meaningful difference.  A difference of 2-3 points in the total score of the Quality of Life Index has been associated with significant improvement in overall quality of life, self-image, physical symptoms, and general health in studies assessing change in quality of life.  PHQ-9: Recent Review Flowsheet Data     Depression screen [facility] 2/9 06/09/2021 02/20/2021 02/19/2020 02/16/2019 02/09/2018   Decreased Interest 0 0 0 0 0   Down, Depressed, Hopeless 0 0 0 0 0   PHQ - 2 Score 0 0 0  0 0   Altered sleeping 0 - - - -   Tired, decreased energy 1 - - - -   Change in appetite 0 - - - -   Feeling bad or failure about yourself  0 - - - -   Trouble concentrating 0 - - - -   Moving slowly or fidgety/restless 0 - - - -   Suicidal thoughts 0 - - - -   PHQ-9 Score 1 - - - -   Difficult doing work/chores Not difficult at all - - - -       Interpretation of Total Score  Total Score Depression Severity:  1-4 = Minimal depression, 5-9 = Mild depression, 10-14 = Moderate depression, 15-19 = Moderately severe depression, 20-27 = Severe depression   Psychosocial Evaluation and Intervention:  Psychosocial Evaluation - 05/28/21 1015       Psychosocial Evaluation & Interventions   Interventions Encouraged to exercise with the program and follow exercise prescription;Therapist referral;Stress management education;Relaxation education    Comments Patient may have a bit of anxiety. He is sleeping better now that he is back home. He can look to his wife, son that lives her and two sons that live in [place]. He has done Cardiac rehab back in 1995.    Expected Outcomes Short: Start HeartTrack to help with mood. Long: Maintain a healthy mental state    Continue Psychosocial Services  Follow up required by staff             Psychosocial Re-Evaluation:   Psychosocial Discharge (Final Psychosocial Re-Evaluation):   Vocational Rehabilitation: Provide vocational rehab assistance to qualifying candidates.   Vocational Rehab Evaluation & Intervention:   Education: Education Goals: Education classes will be provided on a variety of topics geared toward better understanding of heart health and risk factor modification. Participant will state understanding/return demonstration of topics presented as noted by education test scores.  Learning Barriers/Preferences:  Learning Barriers/Preferences - 05/28/21 1008       Learning Barriers/Preferences   Learning Barriers None    Learning Preferences None             General Cardiac Education Topics:  AED/CPR: - Group verbal and written instruction with the use of models to demonstrate the basic use of the AED with the basic ABC's of resuscitation.   Anatomy and Cardiac Procedures: - Group verbal and visual presentation and models provide information about basic cardiac  anatomy and function. Reviews the testing methods done to diagnose heart disease and the outcomes of the test results. Describes the treatment choices: Medical Management, Angioplasty, or Coronary Bypass Surgery for treating various heart conditions including Myocardial Infarction, Angina, Valve Disease, and Cardiac Arrhythmias.  Written material given at graduation. Flowsheet Row Cardiac Rehab from 06/09/2021 in [facility] Cardiac and Pulmonary Rehab  Education need identified 06/09/21       Medication Safety: - Group verbal and visual instruction to review commonly prescribed medications for heart and lung disease. Reviews the medication, class of the drug, and side effects. Includes the steps to properly store meds and maintain the prescription regimen.  Written material given at graduation.   Intimacy: - Group verbal instruction through game format to discuss how heart and lung disease can affect sexual intimacy. Written material given at graduation..   Know Your Numbers and Heart Failure: - Group verbal and visual instruction to discuss disease risk factors for cardiac and pulmonary disease and treatment options.  Reviews associated critical values for Overweight/Obesity, Hypertension, Cholesterol,  and Diabetes.  Discusses basics of heart failure: signs/symptoms and treatments.  Introduces Heart Failure Zone chart for action plan for heart failure.  Written material given at graduation.   Infection Prevention: - Provides verbal and written material to individual with discussion of infection control including proper hand washing and proper equipment cleaning during exercise session. Flowsheet Row Cardiac Rehab from 05/28/2021 in [facility] Cardiac and Pulmonary Rehab  Date 05/28/21  Educator [facility]  Instruction Review Code 1- Verbalizes Understanding       Falls Prevention: - Provides verbal and written material to individual with discussion of falls prevention and safety. Flowsheet Row Cardiac Rehab  from 05/28/2021 in [facility] Cardiac and Pulmonary Rehab  Date 05/28/21  Educator [facility]  Instruction Review Code 1- Verbalizes Understanding       Other: -Provides group and verbal instruction on various topics (see comments)   Knowledge Questionnaire Score:  Knowledge Questionnaire Score - 06/09/21 1025       Knowledge Questionnaire Score   Pre Score 20/26: HR, Depression, Angina, Nutrition, Exercise             Core Components/Risk Factors/Patient Goals at Admission:  Personal Goals and Risk Factors at Admission - 06/09/21 1059       Core Components/Risk Factors/Patient Goals on Admission    Weight Management Yes;Weight Loss    Intervention Weight Management: Develop a combined nutrition and exercise program designed to reach desired caloric intake, while maintaining appropriate intake of nutrient and fiber, sodium and fats, and appropriate energy expenditure required for the weight goal.;Weight Management: Provide education and appropriate resources to help participant work on and attain dietary goals.;Weight Management/Obesity: Establish reasonable short term and long term weight goals.    Admit Weight 170 lb (77.1 kg)    Goal Weight: Short Term 165 lb (74.8 kg)    Goal Weight: Long Term 160 lb (72.6 kg)    Expected Outcomes Short Term: Continue to assess and modify interventions until short term weight is achieved;Long Term: Adherence to nutrition and physical activity/exercise program aimed toward attainment of established weight goal;Weight Loss: Understanding of general recommendations for a balanced deficit meal plan, which promotes 1-2 lb weight loss per week and includes a negative energy balance of [phone] kcal/d;Understanding recommendations for meals to include 15-35% energy as protein, 25-35% energy from fat, 35-60% energy from carbohydrates, less than 200mg  of dietary cholesterol, 20-35 gm of total fiber daily;Understanding of distribution of calorie intake throughout the  day with the consumption of 4-5 meals/snacks    Heart Failure Yes    Intervention Provide a combined exercise and nutrition program that is supplemented with education, support and counseling about heart failure. Directed toward relieving symptoms such as shortness of breath, decreased exercise tolerance, and extremity edema.    Expected Outcomes Improve functional capacity of life;Short term: Attendance in program 2-3 days a week with increased exercise capacity. Reported lower sodium intake. Reported increased fruit and vegetable intake. Reports medication compliance.;Short term: Daily weights obtained and reported for increase. Utilizing diuretic protocols set by physician.;Long term: Adoption of self-care skills and reduction of barriers for early signs and symptoms recognition and intervention leading to self-care maintenance.    Hypertension Yes    Intervention Provide education on lifestyle modifcations including regular physical activity/exercise, weight management, moderate sodium restriction and increased consumption of fresh fruit, vegetables, and low fat dairy, alcohol moderation, and smoking cessation.;Monitor prescription use compliance.    Expected Outcomes Short Term: Continued assessment and intervention until BP is < 140/59mm HG  in hypertensive participants. < 130/74mm HG in hypertensive participants with diabetes, heart failure or chronic kidney disease.;Long Term: Maintenance of blood pressure at goal levels.    Lipids Yes    Intervention Provide education and support for participant on nutrition & aerobic/resistive exercise along with prescribed medications to achieve LDL 70mg , HDL >40mg .    Expected Outcomes Short Term: Participant states understanding of desired cholesterol values and is compliant with medications prescribed. Participant is following exercise prescription and nutrition guidelines.;Long Term: Cholesterol controlled with medications as prescribed, with individualized  exercise RX and with personalized nutrition plan. Value goals: LDL < 70mg , HDL > 40 mg.             Education:Diabetes - Individual verbal and written instruction to review signs/symptoms of diabetes, desired ranges of glucose level fasting, after meals and with exercise. Acknowledge that pre and post exercise glucose checks will be done for 3 sessions at entry of program.   Core Components/Risk Factors/Patient Goals Review:    Core Components/Risk Factors/Patient Goals at Discharge (Final Review):    ITP Comments:  ITP Comments     Row Name 05/28/21 1007 06/09/21 0945         ITP Comments Virtual Visit completed. Patient informed on EP and RD appointment and 6 Minute walk test. Patient also informed of patient health questionnaires on My Chart. Patient Verbalizes understanding. Visit diagnosis can be found in [facility] 05/13/2020. Completed 6MWT and gym orientation. Initial ITP created and sent for review to Dr. Emily Filbert, Medical Director.               Comments: Initial ITP

## 2021-06-09 NOTE — Patient Instructions (Signed)
Patient Instructions  Patient Details  Name: [name] MRN: [id number] Date of Birth: 1951/01/18 Referring Provider:  Minna Merritts, MD  Below are your personal goals for exercise, nutrition, and risk factors. Our goal is to help you stay on track towards obtaining and maintaining these goals. We will be discussing your progress on these goals with you throughout the program.  Initial Exercise Prescription:  Initial Exercise Prescription - 06/09/21 1000       Date of Initial Exercise RX and Referring Provider   Date 06/09/21    Referring Provider [name]      Oxygen   Maintain Oxygen Saturation 88% or higher      Treadmill   MPH 1.7    Grade 0    Minutes 15    METs 2.3      Recumbant Bike   Level 1    RPM 60    Minutes 15    METs 2.4      NuStep   Level 1    SPM 80    Minutes 15    METs 2.4      Track   Laps 23    Minutes 15    METs 2.25      Prescription Details   Frequency (times per week) 3    Duration Progress to 30 minutes of continuous aerobic without signs/symptoms of physical distress      Intensity   THRR 40-80% of Max Heartrate 99 - 133    Ratings of Perceived Exertion 11-13    Perceived Dyspnea 0-4      Progression   Progression Continue to progress workloads to maintain intensity without signs/symptoms of physical distress.      Resistance Training   Training Prescription Yes    Weight 4 lb    Reps 10-15             Exercise Goals: Frequency: Be able to perform aerobic exercise two to three times per week in program working toward 2-5 days per week of home exercise.  Intensity: Work with a perceived exertion of 11 (fairly light) - 15 (hard) while following your exercise prescription.  We will make changes to your prescription with you as you progress through the program.   Duration: Be able to do 30 to 45 minutes of continuous aerobic exercise in addition to a 5 minute warm-up and a 5 minute cool-down routine.    Nutrition Goals: Your personal nutrition goals will be established when you do your nutrition analysis with the dietician.  The following are general nutrition guidelines to follow: Cholesterol < 200mg /day Sodium < 1500mg /day Fiber: Men over 50 yrs - 30 grams per day  Personal Goals:  Personal Goals and Risk Factors at Admission - 06/09/21 1059       Core Components/Risk Factors/Patient Goals on Admission    Weight Management Yes;Weight Loss    Intervention Weight Management: Develop a combined nutrition and exercise program designed to reach desired caloric intake, while maintaining appropriate intake of nutrient and fiber, sodium and fats, and appropriate energy expenditure required for the weight goal.;Weight Management: Provide education and appropriate resources to help participant work on and attain dietary goals.;Weight Management/Obesity: Establish reasonable short term and long term weight goals.    Admit Weight 170 lb (77.1 kg)    Goal Weight: Short Term 165 lb (74.8 kg)    Goal Weight: Long Term 160 lb (72.6 kg)    Expected Outcomes Short Term: Continue to assess  and modify interventions until short term weight is achieved;Long Term: Adherence to nutrition and physical activity/exercise program aimed toward attainment of established weight goal;Weight Loss: Understanding of general recommendations for a balanced deficit meal plan, which promotes 1-2 lb weight loss per week and includes a negative energy balance of [phone] kcal/d;Understanding recommendations for meals to include 15-35% energy as protein, 25-35% energy from fat, 35-60% energy from carbohydrates, less than 200mg  of dietary cholesterol, 20-35 gm of total fiber daily;Understanding of distribution of calorie intake throughout the day with the consumption of 4-5 meals/snacks    Heart Failure Yes    Intervention Provide a combined exercise and nutrition program that is supplemented with education, support and counseling  about heart failure. Directed toward relieving symptoms such as shortness of breath, decreased exercise tolerance, and extremity edema.    Expected Outcomes Improve functional capacity of life;Short term: Attendance in program 2-3 days a week with increased exercise capacity. Reported lower sodium intake. Reported increased fruit and vegetable intake. Reports medication compliance.;Short term: Daily weights obtained and reported for increase. Utilizing diuretic protocols set by physician.;Long term: Adoption of self-care skills and reduction of barriers for early signs and symptoms recognition and intervention leading to self-care maintenance.    Hypertension Yes    Intervention Provide education on lifestyle modifcations including regular physical activity/exercise, weight management, moderate sodium restriction and increased consumption of fresh fruit, vegetables, and low fat dairy, alcohol moderation, and smoking cessation.;Monitor prescription use compliance.    Expected Outcomes Short Term: Continued assessment and intervention until BP is < 140/47mm HG in hypertensive participants. < 130/69mm HG in hypertensive participants with diabetes, heart failure or chronic kidney disease.;Long Term: Maintenance of blood pressure at goal levels.    Lipids Yes    Intervention Provide education and support for participant on nutrition & aerobic/resistive exercise along with prescribed medications to achieve LDL 70mg , HDL >40mg .    Expected Outcomes Short Term: Participant states understanding of desired cholesterol values and is compliant with medications prescribed. Participant is following exercise prescription and nutrition guidelines.;Long Term: Cholesterol controlled with medications as prescribed, with individualized exercise RX and with personalized nutrition plan. Value goals: LDL < 70mg , HDL > 40 mg.             Tobacco Use Initial Evaluation: Social History   Tobacco Use  Smoking Status Former    Packs/day: 3.00   Years: 25.00   Pack years: 75.00   Types: Cigarettes   Quit date: 05/28/1993   Years since quitting: 28.0  Smokeless Tobacco Never    Exercise Goals and Review:  Exercise Goals     Row Name 06/09/21 1059             Exercise Goals   Increase Physical Activity Yes       Intervention Provide advice, education, support and counseling about physical activity/exercise needs.;Develop an individualized exercise prescription for aerobic and resistive training based on initial evaluation findings, risk stratification, comorbidities and participant's personal goals.       Expected Outcomes Short Term: Attend rehab on a regular basis to increase amount of physical activity.;Long Term: Add in home exercise to make exercise part of routine and to increase amount of physical activity.;Long Term: Exercising regularly at least 3-5 days a week.       Increase Strength and Stamina Yes       Intervention Provide advice, education, support and counseling about physical activity/exercise needs.;Develop an individualized exercise prescription for aerobic and resistive training based on initial  evaluation findings, risk stratification, comorbidities and participant's personal goals.       Expected Outcomes Short Term: Increase workloads from initial exercise prescription for resistance, speed, and METs.;Short Term: Perform resistance training exercises routinely during rehab and add in resistance training at home;Long Term: Improve cardiorespiratory fitness, muscular endurance and strength as measured by increased METs and functional capacity (6MWT)       Able to understand and use rate of perceived exertion (RPE) scale Yes       Intervention Provide education and explanation on how to use RPE scale       Expected Outcomes Short Term: Able to use RPE daily in rehab to express subjective intensity level;Long Term:  Able to use RPE to guide intensity level when exercising independently        Able to understand and use Dyspnea scale Yes       Intervention Provide education and explanation on how to use Dyspnea scale       Expected Outcomes Short Term: Able to use Dyspnea scale daily in rehab to express subjective sense of shortness of breath during exertion;Long Term: Able to use Dyspnea scale to guide intensity level when exercising independently       Knowledge and understanding of Target Heart Rate Range (THRR) Yes       Intervention Provide education and explanation of THRR including how the numbers were predicted and where they are located for reference       Expected Outcomes Short Term: Able to state/look up THRR;Long Term: Able to use THRR to govern intensity when exercising independently;Short Term: Able to use daily as guideline for intensity in rehab       Able to check pulse independently Yes       Intervention Provide education and demonstration on how to check pulse in carotid and radial arteries.;Review the importance of being able to check your own pulse for safety during independent exercise       Expected Outcomes Long Term: Able to check pulse independently and accurately;Short Term: Able to explain why pulse checking is important during independent exercise       Understanding of Exercise Prescription Yes       Intervention Provide education, explanation, and written materials on patient's individual exercise prescription       Expected Outcomes Short Term: Able to explain program exercise prescription;Long Term: Able to explain home exercise prescription to exercise independently                Copy of goals given to participant.

## 2021-06-10 ENCOUNTER — Encounter: Payer: Self-pay | Admitting: Internal Medicine

## 2021-06-10 ENCOUNTER — Other Ambulatory Visit: Payer: Self-pay

## 2021-06-10 ENCOUNTER — Ambulatory Visit (INDEPENDENT_AMBULATORY_CARE_PROVIDER_SITE_OTHER): Payer: Medicare Other | Admitting: Internal Medicine

## 2021-06-10 DIAGNOSIS — I10 Essential (primary) hypertension: Secondary | ICD-10-CM | POA: Diagnosis not present

## 2021-06-10 DIAGNOSIS — I25119 Atherosclerotic heart disease of native coronary artery with unspecified angina pectoris: Secondary | ICD-10-CM | POA: Diagnosis not present

## 2021-06-10 DIAGNOSIS — I255 Ischemic cardiomyopathy: Secondary | ICD-10-CM

## 2021-06-10 DIAGNOSIS — I5032 Chronic diastolic (congestive) heart failure: Secondary | ICD-10-CM | POA: Diagnosis not present

## 2021-06-10 DIAGNOSIS — K219 Gastro-esophageal reflux disease without esophagitis: Secondary | ICD-10-CM | POA: Diagnosis not present

## 2021-06-10 DIAGNOSIS — I714 Abdominal aortic aneurysm, without rupture, unspecified: Secondary | ICD-10-CM | POA: Diagnosis not present

## 2021-06-10 MED ORDER — RANOLAZINE ER 500 MG PO TB12: 500.0000 mg | ORAL_TABLET | Freq: Two times a day (BID) | ORAL | 3 refills | Status: DC

## 2021-06-10 NOTE — Assessment & Plan Note (Signed)
Recent MI and failed intervention Now on ranexa 500 bid, losartan 12.5 daily Still on isosorbide 30, crestor 40, ASA 81, plavix 75, carvedilol 3.125 bid Discussed having him look into lifestyle changes of [place]

## 2021-06-10 NOTE — Assessment & Plan Note (Signed)
No fluid overload EF 45-50% with sig dyskinesis  No diuretics needed

## 2021-06-10 NOTE — Assessment & Plan Note (Signed)
BP Readings from Last 3 Encounters:  06/10/21 120/84  05/29/21 (!) 148/92  05/15/21 118/62   Good on losartan now and carvedilol

## 2021-06-10 NOTE — Assessment & Plan Note (Signed)
Had increased from 42 to 59mm from 2021-2022 Due for repeat check soon

## 2021-06-10 NOTE — Assessment & Plan Note (Signed)
Now on protonix 40 bid to prevent any confusing symptoms

## 2021-06-10 NOTE — Progress Notes (Signed)
Subjective:    Patient ID: [name], male    DOB: 1950-09-25, 71 y.o.   MRN: [id number]  HPI Here for hospital follow up  Had indigestion that progressed to more typical anginal pain Nitro x 2 may have helped marginally Called 911 Had non STEMI with sig bump in enzymes  Had cardiac cath--showed blockages Failed intervention---likely occluded vessel Echo showed hypokinetic areas in LV--but overall EF still 45-50% Had gone back up on isosorbide to 30mg  daily--before hospitalization Now started on ranexa Started on losartan 12.5mg  daily also  No chest pain since hospital Still has easy DOE---carrying groceries Still does his chores---finished the bathroom redo he was in when he had MI  No recent heartburn Continues on the pantoprazole bid  Current Outpatient Medications on File Prior to Visit  Medication Sig Dispense Refill   aspirin 81 MG tablet Take 81 mg by mouth daily.     BLACK ELDERBERRY PO Take 1 each by mouth daily.     carvedilol (COREG) 3.125 MG tablet TAKE 1 TABLET (3.125 MG TOTAL) BY MOUTH 2 (TWO) TIMES DAILY. 180 tablet 3   clopidogrel (PLAVIX) 75 MG tablet TAKE 1 TABLET (75 MG TOTAL) BY MOUTH DAILY. 90 tablet 1   ezetimibe (ZETIA) 10 MG tablet TAKE 1 TABLET (10 MG TOTAL) BY MOUTH DAILY. 90 tablet 1   gabapentin (NEURONTIN) 100 MG capsule Take 100 mg by mouth 3 (three) times daily.     isosorbide mononitrate (IMDUR) 30 MG 24 hr tablet TAKE ONE a day 90 tablet 3   losartan (COZAAR) 25 MG tablet TAKE 1/2 TABLET BY MOUTH EVERY DAY 45 tablet 0   mirtazapine (REMERON) 15 MG tablet TAKE 1 TABLET BY MOUTH EVERYDAY AT BEDTIME 90 tablet 3   Multiple Vitamins-Minerals (MULTIVITAMIN WITH MINERALS) tablet Take 1 tablet by mouth daily.     nitroGLYCERIN (NITROSTAT) 0.4 MG SL tablet Place 1 tablet (0.4 mg total) under the tongue every 5 (five) minutes as needed for chest pain. 25 tablet 2   pantoprazole (PROTONIX) 40 MG tablet Take 1 tablet (40 mg total) by mouth 2 (two)  times daily. 180 tablet 3   ranolazine (RANEXA) 500 MG 12 hr tablet Take 1 tablet (500 mg total) by mouth 2 (two) times daily. 180 tablet 3   rosuvastatin (CRESTOR) 40 MG tablet TAKE 1 TABLET BY MOUTH EVERY DAY 90 tablet 3   tiZANidine (ZANAFLEX) 4 MG tablet Take 4 mg by mouth at bedtime.     No current facility-administered medications on file prior to visit.    Allergies  Allergen Reactions   Metoprolol Tartrate Hives and Itching   Quinolones Other (See Comments)    Contraindicated with AAA   Metoclopramide Nausea Only    Other reaction(s): Unknown    Past Medical History:  Diagnosis Date   AAA (abdominal aortic aneurysm)    a. Duplex 01/2012: stable infrarenal saccular AAA at 3.3cm x 3.2xm (f/u recommended 01/2013 per Dr. Rockey Situ)   Alcohol abuse    CAD (coronary artery disease)    a. 1995 s/p CABG x 4: LIMA->LAD, VG->RI (known to be occluded), VG->AM->PDA;  b. 05/2002 Inf STEMI: VG->AM->PDA 100% treated w/ 2 BMS complicated by acute thrombosis req 3 BMS;  c. 07/2003 DES to  LAD & LCX (VG's to PDA & RI 100%);  d. 12/2010 Acute MI ([place]): DES to LCX & LM , LIMA ok,;  e.12/2011 Cath: LM/LCX stents ok , LIMA patent.   Cardiomyopathy ([place])    a. EF  35% by cath 2015.   Diverticulitis    1/06 Diverticulitis--CT of pelvis--diffuse sigmoid divertic   Dyspnea    GERD (gastroesophageal reflux disease)    Hyperlipidemia    Hypertension    Myocardial infarction [facility])    PAD (peripheral artery disease) ([place])    a. external iliac and mesenteric stenosis noted by noninvasive imaging.   Renal artery stenosis ([place])    a. 03/2011 PTA and stenting of L RA. b. last duplex 2016 with stable 1-59% bilateral RAS, incidental >50% R EIA stenosis    Past Surgical History:  Procedure Laterality Date   [place]     8/09  Cath--vein graft occlusions which are old--no acute changes   CARDIAC CATHETERIZATION  11/13/2010   stent x 2 @ [place]   CARDIAC CATHETERIZATION   04/05/2014   stent placement    CARDIAC CATHETERIZATION  10/28/2017   CLOSED REDUCTION SHOULDER DISLOCATION     COLONOSCOPY WITH PROPOFOL N/A 12/22/2018   Procedure: COLONOSCOPY WITH PROPOFOL;  Surgeon: Jonathon Bellows, MD;  Location: [facility] ENDOSCOPY;  Service: Gastroenterology;  Laterality: N/A;   CORONARY ANGIOPLASTY  04/05/2014   stent placement OM 1   CORONARY ARTERY BYPASS GRAFT     CORONARY STENT PLACEMENT  7/12   2 stents--Promus element plus (everolimus eluting)--[facility] [place] WIRE/FFR STUDY N/A 05/13/2021   Procedure: INTRAVASCULAR PRESSURE WIRE/FFR STUDY;  Surgeon: Early Osmond, MD;  Location: [place] CV LAB;  Service: Cardiovascular;  Laterality: N/A;   LEFT HEART CATH AND CORONARY ANGIOGRAPHY N/A 05/13/2021   Procedure: LEFT HEART CATH AND CORONARY ANGIOGRAPHY;  Surgeon: Early Osmond, MD;  Location: [place] CV LAB;  Service: Cardiovascular;  Laterality: N/A;   LEFT HEART CATH AND CORS/GRAFTS ANGIOGRAPHY N/A 10/28/2017   Procedure: LEFT HEART CATH AND CORS/GRAFTS ANGIOGRAPHY;  Surgeon: Leonie Man, MD;  Location: [place] CV LAB;  Service: Cardiovascular;  Laterality: N/A;   LEFT HEART CATHETERIZATION WITH CORONARY/GRAFT ANGIOGRAM N/A 01/04/2012   Procedure: LEFT HEART CATHETERIZATION WITH Beatrix Fetters;  Surgeon: Burnell Blanks, MD;  Location: [facility] CATH LAB;  Service: Cardiovascular;  Laterality: N/A;   LEFT HEART CATHETERIZATION WITH CORONARY/GRAFT ANGIOGRAM N/A 04/05/2014   Procedure: LEFT HEART CATHETERIZATION WITH Beatrix Fetters;  Surgeon: Jettie Booze, MD;  Location: [facility] CATH LAB;  Service: Cardiovascular;  Laterality: N/A;   PERCUTANEOUS CORONARY STENT INTERVENTION (PCI-S)  04/05/2014   Procedure: PERCUTANEOUS CORONARY STENT INTERVENTION (PCI-S);  Surgeon: Jettie Booze, MD;  Location[facility] CATH LAB;  Service: Cardiovascular;;  OM1   RENAL ANGIOGRAM N/A 04/16/2011   Procedure: RENAL  ANGIOGRAM;  Surgeon: Burnell Blanks, MD;  Location: [facility] CATH LAB;  Service: Cardiovascular;  Laterality: N/A;   RENAL ARTERY STENT  03/2011 ?    Family History  Problem Relation Age of Onset   Coronary artery disease Mother        Died MI age 40   Hypertension Mother    Heart attack Mother    Coronary artery disease Sister        Living   Coronary artery disease Brother        Living   Coronary artery disease Father        Died MI age 84   Heart attack Father    Heart attack Maternal Grandfather    Diabetes Neg Hx    Cancer Neg Hx        prostate or colon  Social History   Socioeconomic History   Marital status: Married    Spouse name: Not on file   Number of children: 3   Years of education: Not on file   Highest education level: Not on file  Occupational History   Occupation: Geneticist, molecular (Animal nutritionist)    Comment: Retired  Tobacco Use   Smoking status: Former    Packs/day: 3.00    Years: 25.00    Pack years: 75.00    Types: Cigarettes    Quit date: 05/28/1993    Years since quitting: 28.0    Passive exposure: Past   Smokeless tobacco: Never  Vaping Use   Vaping Use: Never used  Substance and Sexual Activity   Alcohol use: Yes    Alcohol/week: 42.0 standard drinks    Types: 42 Cans of beer per week   Drug use: No   Sexual activity: Not on file  Other Topics Concern   Not on file  Social History Narrative   No living will   No health care POA but requests wife--then children   Would accept resuscitation   Not sure about tube feeds   Social Determinants of Health   Financial Resource Strain: Not on file  Food Insecurity: Not on file  Transportation Needs: Not on file  Physical Activity: Not on file  Stress: Not on file  Social Connections: Not on file  Intimate Partner Violence: Not on file   Review of Systems Has cut down on his beer--only 2 lite beers per day Appetite is okay---actually increased now Will get a little left  leg edema at the end of the day    Objective:   Physical Exam Constitutional:      Appearance: Normal appearance.  Cardiovascular:     Rate and Rhythm: Normal rate and regular rhythm.     Heart sounds: No murmur heard.   No gallop.  Pulmonary:     Effort: Pulmonary effort is normal.     Breath sounds: Normal breath sounds. No wheezing or rales.  Musculoskeletal:     Cervical back: Neck supple.     Right lower leg: No edema.     Left lower leg: No edema.  Lymphadenopathy:     Cervical: No cervical adenopathy.  Neurological:     Mental Status: He is alert.           Assessment & Plan:

## 2021-06-10 NOTE — Patient Instructions (Signed)
I recommend looking into lifestyle plans outlined by Dr Erling Conte years ago.

## 2021-06-16 ENCOUNTER — Other Ambulatory Visit: Payer: Self-pay | Admitting: Internal Medicine

## 2021-06-17 ENCOUNTER — Encounter: Payer: Self-pay | Admitting: *Deleted

## 2021-06-17 DIAGNOSIS — I214 Non-ST elevation (NSTEMI) myocardial infarction: Secondary | ICD-10-CM

## 2021-06-17 NOTE — Progress Notes (Signed)
Cardiac Individual Treatment Plan  Patient Details  Name: DIMITRY HOLSWORTH MRN: [id number] Date of Birth: 03-Dec-1950 Referring Provider:   Flowsheet Row Cardiac Rehab from 06/09/2021 in [facility] Cardiac and Pulmonary Rehab  Referring Provider [name]       Initial Encounter Date:  Flowsheet Row Cardiac Rehab from 06/09/2021 in [facility] Cardiac and Pulmonary Rehab  Date 06/09/21       Visit Diagnosis: NSTEMI (non-ST elevated myocardial infarction) [facility])  Patient's Home Medications on Admission:  Current Outpatient Medications:    aspirin 81 MG tablet, Take 81 mg by mouth daily., Disp: , Rfl:    BLACK ELDERBERRY PO, Take 1 each by mouth daily., Disp: , Rfl:    carvedilol (COREG) 3.125 MG tablet, TAKE 1 TABLET (3.125 MG TOTAL) BY MOUTH 2 (TWO) TIMES DAILY., Disp: 180 tablet, Rfl: 3   clopidogrel (PLAVIX) 75 MG tablet, TAKE 1 TABLET (75 MG TOTAL) BY MOUTH DAILY., Disp: 90 tablet, Rfl: 1   ezetimibe (ZETIA) 10 MG tablet, TAKE 1 TABLET (10 MG TOTAL) BY MOUTH DAILY., Disp: 90 tablet, Rfl: 1   gabapentin (NEURONTIN) 100 MG capsule, Take 100 mg by mouth 3 (three) times daily., Disp: , Rfl:    isosorbide mononitrate (IMDUR) 30 MG 24 hr tablet, TAKE ONE a day, Disp: 90 tablet, Rfl: 3   losartan (COZAAR) 25 MG tablet, TAKE 1/2 TABLET BY MOUTH EVERY DAY, Disp: 45 tablet, Rfl: 0   mirtazapine (REMERON) 15 MG tablet, TAKE 1 TABLET BY MOUTH EVERYDAY AT BEDTIME, Disp: 90 tablet, Rfl: 3   Multiple Vitamins-Minerals (MULTIVITAMIN WITH MINERALS) tablet, Take 1 tablet by mouth daily., Disp: , Rfl:    nitroGLYCERIN (NITROSTAT) 0.4 MG SL tablet, Place 1 tablet (0.4 mg total) under the tongue every 5 (five) minutes as needed for chest pain., Disp: 25 tablet, Rfl: 2   pantoprazole (PROTONIX) 40 MG tablet, Take 1 tablet (40 mg total) by mouth 2 (two) times daily., Disp: 180 tablet, Rfl: 3   ranolazine (RANEXA) 500 MG 12 hr tablet, Take 1 tablet (500 mg total) by mouth 2 (two) times daily., Disp: 180  tablet, Rfl: 3   rosuvastatin (CRESTOR) 40 MG tablet, TAKE 1 TABLET BY MOUTH EVERY DAY, Disp: 90 tablet, Rfl: 3   tiZANidine (ZANAFLEX) 4 MG tablet, Take 4 mg by mouth at bedtime., Disp: , Rfl:   Past Medical History: Past Medical History:  Diagnosis Date   AAA (abdominal aortic aneurysm)    a. Duplex 01/2012: stable infrarenal saccular AAA at 3.3cm x 3.2xm (f/u recommended 01/2013 per Dr. Rockey Situ)   Alcohol abuse    CAD (coronary artery disease)    a. 1995 s/p CABG x 4: LIMA->LAD, VG->RI (known to be occluded), VG->AM->PDA;  b. 05/2002 Inf STEMI: VG->AM->PDA 100% treated w/ 2 BMS complicated by acute thrombosis req 3 BMS;  c. 07/2003 DES to  LAD & LCX (VG's to PDA & RI 100%);  d. 12/2010 Acute MI ([place]): DES to LCX & LM , LIMA ok,;  e.12/2011 Cath: LM/LCX stents ok , LIMA patent.   Cardiomyopathy ([place])    a. EF 35% by cath 2015.   Diverticulitis    1/06 Diverticulitis--CT of pelvis--diffuse sigmoid divertic   Dyspnea    GERD (gastroesophageal reflux disease)    Hyperlipidemia    Hypertension    Myocardial infarction [facility])    PAD (peripheral artery disease) ([place])    a. external iliac and mesenteric stenosis noted by noninvasive imaging.   Renal artery stenosis ([place])  a. 03/2011 PTA and stenting of L RA. b. last duplex 2016 with stable 1-59% bilateral RAS, incidental >50% R EIA stenosis    Tobacco Use: Social History   Tobacco Use  Smoking Status Former   Packs/day: 3.00   Years: 25.00   Pack years: 75.00   Types: Cigarettes   Quit date: 05/28/1993   Years since quitting: 28.0   Passive exposure: Past  Smokeless Tobacco Never    Labs: Recent Review Flowsheet Data     Labs for ITP Cardiac and Pulmonary Rehab Latest Ref Rng & Units 10/29/2017 10/19/2018 02/20/2021 05/13/2021 05/14/2021   Cholestrol 0 - 200 mg/dL 118 169 138 - 137   LDLCALC 0 - 99 mg/dL 24 - - - 65   LDLDIRECT mg/dL - 62.0 65.0 - -   HDL >40 mg/dL 31(L) 44.30 52.20 - 48   Trlycerides <150 mg/dL 316(H) 492.0  Triglyceride is over 400; calculations on Lipids are invalid.(H) 206.0(H) - 119   Hemoglobin A1c 4.8 - 5.6 % - - - - 6.8(H)   TCO2 22 - 32 mmol/L - - - 25 -        Exercise Target Goals: Exercise Program Goal: Individual exercise prescription set using results from initial 6 min walk test and THRR while considering  patients activity barriers and safety.   Exercise Prescription Goal: Initial exercise prescription builds to 30-45 minutes a day of aerobic activity, 2-3 days per week.  Home exercise guidelines will be given to patient during program as part of exercise prescription that the participant will acknowledge.   Education: Aerobic Exercise: - Group verbal and visual presentation on the components of exercise prescription. Introduces F.I.T.T principle from ACSM for exercise prescriptions.  Reviews F.I.T.T. principles of aerobic exercise including progression. Written material given at graduation.   Education: Resistance Exercise: - Group verbal and visual presentation on the components of exercise prescription. Introduces F.I.T.T principle from ACSM for exercise prescriptions  Reviews F.I.T.T. principles of resistance exercise including progression. Written material given at graduation.    Education: Exercise & Equipment Safety: - Individual verbal instruction and demonstration of equipment use and safety with use of the equipment. Flowsheet Row Cardiac Rehab from 05/28/2021 in [facility] Cardiac and Pulmonary Rehab  Date 05/28/21  Educator [facility]  Instruction Review Code 1- Verbalizes Understanding       Education: Exercise Physiology & General Exercise Guidelines: - Group verbal and written instruction with models to review the exercise physiology of the cardiovascular system and associated critical values. Provides general exercise guidelines with specific guidelines to those with heart or lung disease.  Flowsheet Row Cardiac Rehab from 06/09/2021 in [facility] Cardiac and Pulmonary Rehab   Education need identified 06/09/21       Education: Flexibility, Balance, Mind/Body Relaxation: - Group verbal and visual presentation with interactive activity on the components of exercise prescription. Introduces F.I.T.T principle from ACSM for exercise prescriptions. Reviews F.I.T.T. principles of flexibility and balance exercise training including progression. Also discusses the mind body connection.  Reviews various relaxation techniques to help reduce and manage stress (i.e. Deep breathing, progressive muscle relaxation, and visualization). Balance handout provided to take home. Written material given at graduation.   Activity Barriers & Risk Stratification:  Activity Barriers & Cardiac Risk Stratification - 06/09/21 1056       Activity Barriers & Cardiac Risk Stratification   Activity Barriers Shortness of Breath;Other (comment);Deconditioning    Comments Sciatica    Cardiac Risk Stratification High  6 Minute Walk:  6 Minute Walk     Row Name 06/09/21 1059         6 Minute Walk   Phase Initial     Distance 1055 feet     Walk Time 6 minutes     # of Rest Breaks 0     MPH 1.98     METS 2.42     RPE 13     Perceived Dyspnea  2     VO2 Peak 8.48     Symptoms Yes (comment)     Comments SOB     Resting HR 65 bpm     Resting BP 132/82     Resting Oxygen Saturation  97 %     Exercise Oxygen Saturation  during 6 min walk 98 %     Max Ex. HR 91 bpm     Max Ex. BP 152/82     2 Minute Post BP 136/76              Oxygen Initial Assessment:   Oxygen Re-Evaluation:   Oxygen Discharge (Final Oxygen Re-Evaluation):   Initial Exercise Prescription:  Initial Exercise Prescription - 06/09/21 1000       Date of Initial Exercise RX and Referring Provider   Date 06/09/21    Referring Provider [name]      Oxygen   Maintain Oxygen Saturation 88% or higher      Treadmill   MPH 1.7    Grade 0    Minutes 15    METs 2.3       Recumbant Bike   Level 1    RPM 60    Minutes 15    METs 2.4      NuStep   Level 1    SPM 80    Minutes 15    METs 2.4      Track   Laps 23    Minutes 15    METs 2.25      Prescription Details   Frequency (times per week) 3    Duration Progress to 30 minutes of continuous aerobic without signs/symptoms of physical distress      Intensity   THRR 40-80% of Max Heartrate 99 - 133    Ratings of Perceived Exertion 11-13    Perceived Dyspnea 0-4      Progression   Progression Continue to progress workloads to maintain intensity without signs/symptoms of physical distress.      Resistance Training   Training Prescription Yes    Weight 4 lb    Reps 10-15             Perform Capillary Blood Glucose checks as needed.  Exercise Prescription Changes:   Exercise Prescription Changes     Row Name 06/09/21 1000             Response to Exercise   Blood Pressure (Admit) 132/82       Blood Pressure (Exercise) 152/82       Blood Pressure (Exit) 136/76       Heart Rate (Admit) 65 bpm       Heart Rate (Exercise) 91 bpm       Heart Rate (Exit) 64 bpm       Oxygen Saturation (Admit) 97 %       Oxygen Saturation (Exercise) 98 %       Rating of Perceived Exertion (Exercise) 13       Perceived Dyspnea (Exercise) 2  Symptoms SOB       Comments walk test results                Exercise Comments:   Exercise Goals and Review:   Exercise Goals     Row Name 06/09/21 1059             Exercise Goals   Increase Physical Activity Yes       Intervention Provide advice, education, support and counseling about physical activity/exercise needs.;Develop an individualized exercise prescription for aerobic and resistive training based on initial evaluation findings, risk stratification, comorbidities and participant's personal goals.       Expected Outcomes Short Term: Attend rehab on a regular basis to increase amount of physical activity.;Long Term: Add in home  exercise to make exercise part of routine and to increase amount of physical activity.;Long Term: Exercising regularly at least 3-5 days a week.       Increase Strength and Stamina Yes       Intervention Provide advice, education, support and counseling about physical activity/exercise needs.;Develop an individualized exercise prescription for aerobic and resistive training based on initial evaluation findings, risk stratification, comorbidities and participant's personal goals.       Expected Outcomes Short Term: Increase workloads from initial exercise prescription for resistance, speed, and METs.;Short Term: Perform resistance training exercises routinely during rehab and add in resistance training at home;Long Term: Improve cardiorespiratory fitness, muscular endurance and strength as measured by increased METs and functional capacity (6MWT)       Able to understand and use rate of perceived exertion (RPE) scale Yes       Intervention Provide education and explanation on how to use RPE scale       Expected Outcomes Short Term: Able to use RPE daily in rehab to express subjective intensity level;Long Term:  Able to use RPE to guide intensity level when exercising independently       Able to understand and use Dyspnea scale Yes       Intervention Provide education and explanation on how to use Dyspnea scale       Expected Outcomes Short Term: Able to use Dyspnea scale daily in rehab to express subjective sense of shortness of breath during exertion;Long Term: Able to use Dyspnea scale to guide intensity level when exercising independently       Knowledge and understanding of Target Heart Rate Range (THRR) Yes       Intervention Provide education and explanation of THRR including how the numbers were predicted and where they are located for reference       Expected Outcomes Short Term: Able to state/look up THRR;Long Term: Able to use THRR to govern intensity when exercising independently;Short Term:  Able to use daily as guideline for intensity in rehab       Able to check pulse independently Yes       Intervention Provide education and demonstration on how to check pulse in carotid and radial arteries.;Review the importance of being able to check your own pulse for safety during independent exercise       Expected Outcomes Long Term: Able to check pulse independently and accurately;Short Term: Able to explain why pulse checking is important during independent exercise       Understanding of Exercise Prescription Yes       Intervention Provide education, explanation, and written materials on patient's individual exercise prescription       Expected Outcomes Short Term: Able to explain program exercise  prescription;Long Term: Able to explain home exercise prescription to exercise independently                Exercise Goals Re-Evaluation :   Discharge Exercise Prescription (Final Exercise Prescription Changes):  Exercise Prescription Changes - 06/09/21 1000       Response to Exercise   Blood Pressure (Admit) 132/82    Blood Pressure (Exercise) 152/82    Blood Pressure (Exit) 136/76    Heart Rate (Admit) 65 bpm    Heart Rate (Exercise) 91 bpm    Heart Rate (Exit) 64 bpm    Oxygen Saturation (Admit) 97 %    Oxygen Saturation (Exercise) 98 %    Rating of Perceived Exertion (Exercise) 13    Perceived Dyspnea (Exercise) 2    Symptoms SOB    Comments walk test results             Nutrition:  Target Goals: Understanding of nutrition guidelines, daily intake of sodium 1500mg , cholesterol 200mg , calories 30% from fat and 7% or less from saturated fats, daily to have 5 or more servings of fruits and vegetables.  Education: All About Nutrition: -Group instruction provided by verbal, written material, interactive activities, discussions, models, and posters to present general guidelines for heart healthy nutrition including fat, fiber, MyPlate, the role of sodium in heart healthy  nutrition, utilization of the nutrition label, and utilization of this knowledge for meal planning. Follow up email sent as well. Written material given at graduation. Flowsheet Row Cardiac Rehab from 06/09/2021 in [facility] Cardiac and Pulmonary Rehab  Education need identified 06/09/21       Biometrics:  Pre Biometrics - 06/09/21 1055       Pre Biometrics   Height 5' 4.75" (1.645 m)    Weight 170 lb 8 oz (77.3 kg)    BMI (Calculated) 28.58    Single Leg Stand 3.7 seconds              Nutrition Therapy Plan and Nutrition Goals:  Nutrition Therapy & Goals - 06/09/21 1025       Intervention Plan   Intervention Prescribe, educate and counsel regarding individualized specific dietary modifications aiming towards targeted core components such as weight, hypertension, lipid management, diabetes, heart failure and other comorbidities.    Expected Outcomes Short Term Goal: Understand basic principles of dietary content, such as calories, fat, sodium, cholesterol and nutrients.;Short Term Goal: A plan has been developed with personal nutrition goals set during dietitian appointment.;Long Term Goal: Adherence to prescribed nutrition plan.             Nutrition Assessments:  MEDIFICTS Score Key: ?70 Need to make dietary changes  40-70 Heart Healthy Diet ? 40 Therapeutic Level Cholesterol Diet  Flowsheet Row Cardiac Rehab from 06/09/2021 in [facility] Cardiac and Pulmonary Rehab  Picture Your Plate Total Score on Admission 73      Picture Your Plate Scores: <29 Unhealthy dietary pattern with much room for improvement. 41-50 Dietary pattern unlikely to meet recommendations for good health and room for improvement. 51-60 More healthful dietary pattern, with some room for improvement.  >60 Healthy dietary pattern, although there may be some specific behaviors that could be improved.    Nutrition Goals Re-Evaluation:   Nutrition Goals Discharge (Final Nutrition Goals  Re-Evaluation):   Psychosocial: Target Goals: Acknowledge presence or absence of significant depression and/or stress, maximize coping skills, provide positive support system. Participant is able to verbalize types and ability to use techniques and skills needed for reducing  stress and depression.   Education: Stress, Anxiety, and Depression - Group verbal and visual presentation to define topics covered.  Reviews how body is impacted by stress, anxiety, and depression.  Also discusses healthy ways to reduce stress and to treat/manage anxiety and depression.  Written material given at graduation. Flowsheet Row Cardiac Rehab from 06/09/2021 in [facility] Cardiac and Pulmonary Rehab  Education need identified 06/09/21       Education: Sleep Hygiene -Provides group verbal and written instruction about how sleep can affect your health.  Define sleep hygiene, discuss sleep cycles and impact of sleep habits. Review good sleep hygiene tips.    Initial Review & Psychosocial Screening:  Initial Psych Review & Screening - 05/28/21 1013       Initial Review   Current issues with Current Psychotropic Meds;Current Sleep Concerns      Family Dynamics   Good Support System? Yes    Comments Patient may have a bit of anxiety. He is sleeping better now that he is back home. He can look to his wife, son that lives her and two sons that live in [place]. He has done Cardiac rehab back in 1995.      Barriers   Psychosocial barriers to participate in program The patient should benefit from training in stress management and relaxation.      Screening Interventions   Interventions Encouraged to exercise;To provide support and resources with identified psychosocial needs;Provide feedback about the scores to participant    Expected Outcomes Short Term goal: Utilizing psychosocial counselor, staff and physician to assist with identification of specific Stressors or current issues interfering with healing process.  Setting desired goal for each stressor or current issue identified.;Long Term Goal: Stressors or current issues are controlled or eliminated.;Short Term goal: Identification and review with participant of any Quality of Life or Depression concerns found by scoring the questionnaire.;Long Term goal: The participant improves quality of Life and PHQ9 Scores as seen by post scores and/or verbalization of changes             Quality of Life Scores:   Quality of Life - 06/09/21 1027       Quality of Life   Select Quality of Life      Quality of Life Scores   Health/Function Pre 24.9 %    Socioeconomic Pre 20.94 %    Psych/Spiritual Pre 27.5 %    Family Pre 28.8 %    GLOBAL Pre 25.07 %            Scores of 19 and below usually indicate a poorer quality of life in these areas.  A difference of  2-3 points is a clinically meaningful difference.  A difference of 2-3 points in the total score of the Quality of Life Index has been associated with significant improvement in overall quality of life, self-image, physical symptoms, and general health in studies assessing change in quality of life.  PHQ-9: Recent Review Flowsheet Data     Depression screen [facility] 2/9 06/09/2021 02/20/2021 02/19/2020 02/16/2019 02/09/2018   Decreased Interest 0 0 0 0 0   Down, Depressed, Hopeless 0 0 0 0 0   PHQ - 2 Score 0 0 0 0 0   Altered sleeping 0 - - - -   Tired, decreased energy 1 - - - -   Change in appetite 0 - - - -   Feeling bad or failure about yourself  0 - - - -   Trouble concentrating  0 - - - -   Moving slowly or fidgety/restless 0 - - - -   Suicidal thoughts 0 - - - -   PHQ-9 Score 1 - - - -   Difficult doing work/chores Not difficult at all - - - -      Interpretation of Total Score  Total Score Depression Severity:  1-4 = Minimal depression, 5-9 = Mild depression, 10-14 = Moderate depression, 15-19 = Moderately severe depression, 20-27 = Severe depression   Psychosocial Evaluation and  Intervention:  Psychosocial Evaluation - 05/28/21 1015       Psychosocial Evaluation & Interventions   Interventions Encouraged to exercise with the program and follow exercise prescription;Therapist referral;Stress management education;Relaxation education    Comments Patient may have a bit of anxiety. He is sleeping better now that he is back home. He can look to his wife, son that lives her and two sons that live in [place]. He has done Cardiac rehab back in 1995.    Expected Outcomes Short: Start HeartTrack to help with mood. Long: Maintain a healthy mental state    Continue Psychosocial Services  Follow up required by staff             Psychosocial Re-Evaluation:   Psychosocial Discharge (Final Psychosocial Re-Evaluation):   Vocational Rehabilitation: Provide vocational rehab assistance to qualifying candidates.   Vocational Rehab Evaluation & Intervention:   Education: Education Goals: Education classes will be provided on a variety of topics geared toward better understanding of heart health and risk factor modification. Participant will state understanding/return demonstration of topics presented as noted by education test scores.  Learning Barriers/Preferences:  Learning Barriers/Preferences - 05/28/21 1008       Learning Barriers/Preferences   Learning Barriers None    Learning Preferences None             General Cardiac Education Topics:  AED/CPR: - Group verbal and written instruction with the use of models to demonstrate the basic use of the AED with the basic ABC's of resuscitation.   Anatomy and Cardiac Procedures: - Group verbal and visual presentation and models provide information about basic cardiac anatomy and function. Reviews the testing methods done to diagnose heart disease and the outcomes of the test results. Describes the treatment choices: Medical Management, Angioplasty, or Coronary Bypass Surgery for treating various heart conditions  including Myocardial Infarction, Angina, Valve Disease, and Cardiac Arrhythmias.  Written material given at graduation. Flowsheet Row Cardiac Rehab from 06/09/2021 in [facility] Cardiac and Pulmonary Rehab  Education need identified 06/09/21       Medication Safety: - Group verbal and visual instruction to review commonly prescribed medications for heart and lung disease. Reviews the medication, class of the drug, and side effects. Includes the steps to properly store meds and maintain the prescription regimen.  Written material given at graduation.   Intimacy: - Group verbal instruction through game format to discuss how heart and lung disease can affect sexual intimacy. Written material given at graduation..   Know Your Numbers and Heart Failure: - Group verbal and visual instruction to discuss disease risk factors for cardiac and pulmonary disease and treatment options.  Reviews associated critical values for Overweight/Obesity, Hypertension, Cholesterol, and Diabetes.  Discusses basics of heart failure: signs/symptoms and treatments.  Introduces Heart Failure Zone chart for action plan for heart failure.  Written material given at graduation.   Infection Prevention: - Provides verbal and written material to individual with discussion of infection control including proper hand  washing and proper equipment cleaning during exercise session. Flowsheet Row Cardiac Rehab from 05/28/2021 in [facility] Cardiac and Pulmonary Rehab  Date 05/28/21  Educator [facility]  Instruction Review Code 1- Verbalizes Understanding       Falls Prevention: - Provides verbal and written material to individual with discussion of falls prevention and safety. Flowsheet Row Cardiac Rehab from 05/28/2021 in [facility] Cardiac and Pulmonary Rehab  Date 05/28/21  Educator [facility]  Instruction Review Code 1- Verbalizes Understanding       Other: -Provides group and verbal instruction on various topics (see comments)   Knowledge  Questionnaire Score:  Knowledge Questionnaire Score - 06/09/21 1025       Knowledge Questionnaire Score   Pre Score 20/26: HR, Depression, Angina, Nutrition, Exercise             Core Components/Risk Factors/Patient Goals at Admission:  Personal Goals and Risk Factors at Admission - 06/09/21 1059       Core Components/Risk Factors/Patient Goals on Admission    Weight Management Yes;Weight Loss    Intervention Weight Management: Develop a combined nutrition and exercise program designed to reach desired caloric intake, while maintaining appropriate intake of nutrient and fiber, sodium and fats, and appropriate energy expenditure required for the weight goal.;Weight Management: Provide education and appropriate resources to help participant work on and attain dietary goals.;Weight Management/Obesity: Establish reasonable short term and long term weight goals.    Admit Weight 170 lb (77.1 kg)    Goal Weight: Short Term 165 lb (74.8 kg)    Goal Weight: Long Term 160 lb (72.6 kg)    Expected Outcomes Short Term: Continue to assess and modify interventions until short term weight is achieved;Long Term: Adherence to nutrition and physical activity/exercise program aimed toward attainment of established weight goal;Weight Loss: Understanding of general recommendations for a balanced deficit meal plan, which promotes 1-2 lb weight loss per week and includes a negative energy balance of [phone] kcal/d;Understanding recommendations for meals to include 15-35% energy as protein, 25-35% energy from fat, 35-60% energy from carbohydrates, less than 200mg  of dietary cholesterol, 20-35 gm of total fiber daily;Understanding of distribution of calorie intake throughout the day with the consumption of 4-5 meals/snacks    Heart Failure Yes    Intervention Provide a combined exercise and nutrition program that is supplemented with education, support and counseling about heart failure. Directed toward relieving  symptoms such as shortness of breath, decreased exercise tolerance, and extremity edema.    Expected Outcomes Improve functional capacity of life;Short term: Attendance in program 2-3 days a week with increased exercise capacity. Reported lower sodium intake. Reported increased fruit and vegetable intake. Reports medication compliance.;Short term: Daily weights obtained and reported for increase. Utilizing diuretic protocols set by physician.;Long term: Adoption of self-care skills and reduction of barriers for early signs and symptoms recognition and intervention leading to self-care maintenance.    Hypertension Yes    Intervention Provide education on lifestyle modifcations including regular physical activity/exercise, weight management, moderate sodium restriction and increased consumption of fresh fruit, vegetables, and low fat dairy, alcohol moderation, and smoking cessation.;Monitor prescription use compliance.    Expected Outcomes Short Term: Continued assessment and intervention until BP is < 140/53mm HG in hypertensive participants. < 130/32mm HG in hypertensive participants with diabetes, heart failure or chronic kidney disease.;Long Term: Maintenance of blood pressure at goal levels.    Lipids Yes    Intervention Provide education and support for participant on nutrition & aerobic/resistive exercise along with prescribed medications  to achieve LDL 70mg , HDL >40mg .    Expected Outcomes Short Term: Participant states understanding of desired cholesterol values and is compliant with medications prescribed. Participant is following exercise prescription and nutrition guidelines.;Long Term: Cholesterol controlled with medications as prescribed, with individualized exercise RX and with personalized nutrition plan. Value goals: LDL < 70mg , HDL > 40 mg.             Education:Diabetes - Individual verbal and written instruction to review signs/symptoms of diabetes, desired ranges of glucose level  fasting, after meals and with exercise. Acknowledge that pre and post exercise glucose checks will be done for 3 sessions at entry of program.   Core Components/Risk Factors/Patient Goals Review:    Core Components/Risk Factors/Patient Goals at Discharge (Final Review):    ITP Comments:  ITP Comments     Row Name 05/28/21 1007 06/09/21 0945 06/17/21 0827       ITP Comments Virtual Visit completed. Patient informed on EP and RD appointment and 6 Minute walk test. Patient also informed of patient health questionnaires on My Chart. Patient Verbalizes understanding. Visit diagnosis can be found in [facility] 05/13/2020. Completed 6MWT and gym orientation. Initial ITP created and sent for review to Dr. Emily Filbert, Medical Director. 30 Day review completed. Medical Director ITP review done, changes made as directed, and signed approval by Medical Director.   new to program              Comments:

## 2021-06-19 ENCOUNTER — Ambulatory Visit (INDEPENDENT_AMBULATORY_CARE_PROVIDER_SITE_OTHER): Payer: Medicare Other

## 2021-06-19 ENCOUNTER — Ambulatory Visit (INDEPENDENT_AMBULATORY_CARE_PROVIDER_SITE_OTHER): Payer: Medicare Other | Admitting: Vascular Surgery

## 2021-06-19 ENCOUNTER — Encounter (INDEPENDENT_AMBULATORY_CARE_PROVIDER_SITE_OTHER): Payer: Self-pay | Admitting: Vascular Surgery

## 2021-06-19 ENCOUNTER — Other Ambulatory Visit: Payer: Self-pay

## 2021-06-19 VITALS — BP 131/82 | HR 67 | Ht 63.0 in | Wt 171.0 lb

## 2021-06-19 DIAGNOSIS — I25119 Atherosclerotic heart disease of native coronary artery with unspecified angina pectoris: Secondary | ICD-10-CM | POA: Diagnosis not present

## 2021-06-19 DIAGNOSIS — I10 Essential (primary) hypertension: Secondary | ICD-10-CM | POA: Diagnosis not present

## 2021-06-19 DIAGNOSIS — E785 Hyperlipidemia, unspecified: Secondary | ICD-10-CM | POA: Diagnosis not present

## 2021-06-19 DIAGNOSIS — I714 Abdominal aortic aneurysm, without rupture, unspecified: Secondary | ICD-10-CM

## 2021-06-19 DIAGNOSIS — I7143 Infrarenal abdominal aortic aneurysm, without rupture: Secondary | ICD-10-CM

## 2021-06-19 DIAGNOSIS — I255 Ischemic cardiomyopathy: Secondary | ICD-10-CM | POA: Diagnosis not present

## 2021-06-19 NOTE — Progress Notes (Signed)
MRN : [id number]  [name] is a 71 y.o. ([birth year]) male who presents with chief complaint of  Chief Complaint  Patient presents with   Follow-up    3 Mo AAA  .  History of Present Illness: Patient returns today in follow up of his abdominal aortic aneurysm.  Since his last visit, he has had a heart attack.  He is doing reasonably well from this but does say his energy level is down.  No aneurysm related symptoms. Specifically, the patient denies new back or abdominal pain, or signs of peripheral embolization Duplex today shows a stable 4.53 cm infrarenal abdominal aortic aneurysm without significant growth from his previous study  Current Outpatient Medications  Medication Sig Dispense Refill   aspirin 81 MG tablet Take 81 mg by mouth daily.     BLACK ELDERBERRY PO Take 1 each by mouth daily.     carvedilol (COREG) 3.125 MG tablet TAKE 1 TABLET (3.125 MG TOTAL) BY MOUTH 2 (TWO) TIMES DAILY. 180 tablet 3   clopidogrel (PLAVIX) 75 MG tablet TAKE 1 TABLET (75 MG TOTAL) BY MOUTH DAILY. 90 tablet 1   ezetimibe (ZETIA) 10 MG tablet TAKE 1 TABLET (10 MG TOTAL) BY MOUTH DAILY. 90 tablet 1   gabapentin (NEURONTIN) 100 MG capsule Take 100 mg by mouth 3 (three) times daily.     isosorbide mononitrate (IMDUR) 30 MG 24 hr tablet TAKE ONE a day 90 tablet 3   losartan (COZAAR) 25 MG tablet TAKE 1/2 TABLET BY MOUTH EVERY DAY 45 tablet 0   mirtazapine (REMERON) 15 MG tablet TAKE 1 TABLET BY MOUTH EVERYDAY AT BEDTIME 90 tablet 3   Multiple Vitamins-Minerals (MULTIVITAMIN WITH MINERALS) tablet Take 1 tablet by mouth daily.     nitroGLYCERIN (NITROSTAT) 0.4 MG SL tablet PLACE 1 TABLET (0.4 MG TOTAL) UNDER THE TONGUE EVERY 5 (FIVE) MINUTES AS NEEDED FOR CHEST PAIN. 25 tablet 2   pantoprazole (PROTONIX) 40 MG tablet Take 1 tablet (40 mg total) by mouth 2 (two) times daily. 180 tablet 3   ranolazine (RANEXA) 500 MG 12 hr tablet Take 1 tablet (500 mg total) by mouth 2 (two) times daily. 180 tablet 3    rosuvastatin (CRESTOR) 40 MG tablet TAKE 1 TABLET BY MOUTH EVERY DAY 90 tablet 3   tiZANidine (ZANAFLEX) 4 MG tablet Take 4 mg by mouth at bedtime.     No current facility-administered medications for this visit.    Past Medical History:  Diagnosis Date   AAA (abdominal aortic aneurysm)    a. Duplex 01/2012: stable infrarenal saccular AAA at 3.3cm x 3.2xm (f/u recommended 01/2013 per Dr. Rockey Situ)   Alcohol abuse    CAD (coronary artery disease)    a. 1995 s/p CABG x 4: LIMA->LAD, VG->RI (known to be occluded), VG->AM->PDA;  b. 05/2002 Inf STEMI: VG->AM->PDA 100% treated w/ 2 BMS complicated by acute thrombosis req 3 BMS;  c. 07/2003 DES to  LAD & LCX (VG's to PDA & RI 100%);  d. 12/2010 Acute MI ([place]): DES to LCX & LM , LIMA ok,;  e.12/2011 Cath: LM/LCX stents ok , LIMA patent.   Cardiomyopathy ([place])    a. EF 35% by cath 2015.   Diverticulitis    1/06 Diverticulitis--CT of pelvis--diffuse sigmoid divertic   Dyspnea    GERD (gastroesophageal reflux disease)    Hyperlipidemia    Hypertension    Myocardial infarction [facility])    PAD (peripheral artery disease) ([place])    a. external iliac  and mesenteric stenosis noted by noninvasive imaging.   Renal artery stenosis ([place])    a. 03/2011 PTA and stenting of L RA. b. last duplex 2016 with stable 1-59% bilateral RAS, incidental >50% R EIA stenosis    Past Surgical History:  Procedure Laterality Date   [employer]     8/09  Cath--vein graft occlusions which are old--no acute changes   CARDIAC CATHETERIZATION  11/13/2010   stent x 2 @ [place]   CARDIAC CATHETERIZATION  04/05/2014   stent placement    CARDIAC CATHETERIZATION  10/28/2017   CLOSED REDUCTION SHOULDER DISLOCATION     COLONOSCOPY WITH PROPOFOL N/A 12/22/2018   Procedure: COLONOSCOPY WITH PROPOFOL;  Surgeon: Jonathon Bellows, MD;  Location: [facility] ENDOSCOPY;  Service: Gastroenterology;  Laterality: N/A;   CORONARY ANGIOPLASTY  04/05/2014   stent placement OM  1   CORONARY ARTERY BYPASS GRAFT     CORONARY STENT PLACEMENT  7/12   2 stents--Promus element plus (everolimus eluting)--[facility] in [place] WIRE/FFR STUDY N/A 05/13/2021   Procedure: INTRAVASCULAR PRESSURE WIRE/FFR STUDY;  Surgeon: Early Osmond, MD;  Location: [place] CV LAB;  Service: Cardiovascular;  Laterality: N/A;   LEFT HEART CATH AND CORONARY ANGIOGRAPHY N/A 05/13/2021   Procedure: LEFT HEART CATH AND CORONARY ANGIOGRAPHY;  Surgeon: Early Osmond, MD;  Location: [place] CV LAB;  Service: Cardiovascular;  Laterality: N/A;   LEFT HEART CATH AND CORS/GRAFTS ANGIOGRAPHY N/A 10/28/2017   Procedure: LEFT HEART CATH AND CORS/GRAFTS ANGIOGRAPHY;  Surgeon: Leonie Man, MD;  Location: [place] CV LAB;  Service: Cardiovascular;  Laterality: N/A;   LEFT HEART CATHETERIZATION WITH CORONARY/GRAFT ANGIOGRAM N/A 01/04/2012   Procedure: LEFT HEART CATHETERIZATION WITH Beatrix Fetters;  Surgeon: Burnell Blanks, MD;  Location: [facility] CATH LAB;  Service: Cardiovascular;  Laterality: N/A;   LEFT HEART CATHETERIZATION WITH CORONARY/GRAFT ANGIOGRAM N/A 04/05/2014   Procedure: LEFT HEART CATHETERIZATION WITH Beatrix Fetters;  Surgeon: Jettie Booze, MD;  Location: [facility] CATH LAB;  Service: Cardiovascular;  Laterality: N/A;   PERCUTANEOUS CORONARY STENT INTERVENTION (PCI-S)  04/05/2014   Procedure: PERCUTANEOUS CORONARY STENT INTERVENTION (PCI-S);  Surgeon: Jettie Booze, MD;  Location: [facility] CATH LAB;  Service: Cardiovascular;;  OM1   RENAL ANGIOGRAM N/A 04/16/2011   Procedure: RENAL ANGIOGRAM;  Surgeon: Burnell Blanks, MD;  Location: [facility] CATH LAB;  Service: Cardiovascular;  Laterality: N/A;   RENAL ARTERY STENT  03/2011 ?     Social History   Tobacco Use   Smoking status: Former    Packs/day: 3.00    Years: 25.00    Pack years: 75.00    Types: Cigarettes    Quit date: 05/28/1993    Years since quitting: 28.0     Passive exposure: Past   Smokeless tobacco: Never  Vaping Use   Vaping Use: Never used  Substance Use Topics   Alcohol use: Yes    Alcohol/week: 42.0 standard drinks    Types: 46 Cans of beer per week   Drug use: No      Family History  Problem Relation Age of Onset   Coronary artery disease Mother        Died MI age 39   Hypertension Mother    Heart attack Mother    Coronary artery disease Sister        Living   Coronary artery disease Brother        Living   Coronary artery disease  Father        Died MI age 46   Heart attack Father    Heart attack Maternal Grandfather    Diabetes Neg Hx    Cancer Neg Hx        prostate or colon     Allergies  Allergen Reactions   Metoprolol Tartrate Hives and Itching   Quinolones Other (See Comments)    Contraindicated with AAA   Metoclopramide Nausea Only    Other reaction(s): Unknown    REVIEW OF SYSTEMS (Negative unless checked)   Constitutional: _0 Weight loss  _1 Fever  _2 Chills Cardiac: _3 Chest pain   _4 Chest pressure   _5 Palpitations   _6 Shortness of breath when laying flat   _7 Shortness of breath at rest   _8 Shortness of breath with exertion. Vascular:  _9 Pain in legs with walking   _10 Pain in legs at rest   _11 Pain in legs when laying flat   _12 Claudication   _13 Pain in feet when walking  _14 Pain in feet at rest  _15 Pain in feet when laying flat   _16 History of DVT   _17 Phlebitis   _18 Swelling in legs   _19 Varicose veins   _20 Non-healing ulcers Pulmonary:   _21 Uses home oxygen   _22 Productive cough   _23 Hemoptysis   _24 Wheeze  _25 COPD   _26 Asthma Neurologic:  _27 Dizziness  _28 Blackouts   _29 Seizures   _30 History of stroke   _31 History of TIA  _32 Aphasia   _33 Temporary blindness   _34 Dysphagia   _35 Weakness or numbness in arms   _36 Weakness or numbness in legs Musculoskeletal:  _37 Arthritis   _38 Joint swelling   _39 Joint pain   _40 Low back pain Hematologic:  _41 Easy bruising  _42 Easy bleeding   _43 Hypercoagulable state   _44 Anemic   _45 Hepatitis Gastrointestinal:  _46 Blood in stool   _47 Vomiting blood  _48 Gastroesophageal reflux/heartburn   _49 Abdominal pain Genitourinary:  _50 Chronic kidney disease   _51 Difficult urination  _52 Frequent urination  _53 Burning with urination   _54 Hematuria Skin:  _55 Rashes   _56 Ulcers   _57 Wounds Psychological:  _58 History of anxiety   _59  History of major depression.  Physical Examination  BP 131/82    Pulse 67    Ht _60  (1.6 m)    Wt 171 lb (77.6 kg)    BMI 30.29 kg/m  Gen:  WD/WN, NAD Head: [place]/AT, No temporalis wasting. Ear/Nose/Throat: Hearing grossly intact, nares w/o erythema or drainage Eyes: Conjunctiva clear. Sclera non-icteric Neck: Supple.  Trachea midline Pulmonary:  Good air movement, no use of accessory muscles.  Cardiac: RRR, no JVD Vascular:  Vessel Right Left  Radial Palpable Palpable                                   Gastrointestinal: soft, non-tender/non-distended.  Increased aortic impulse Musculoskeletal: M/S 5/5 throughout.  No deformity or atrophy.  Trace lower extremity edema. Neurologic: Sensation grossly intact in extremities.  Symmetrical.  Speech is fluent.  Psychiatric: Judgment intact, Mood & affect appropriate for pt's clinical situation. Dermatologic: No rashes or ulcers noted.  No cellulitis or open wounds.      Labs Recent Results (from the past 2160 hour(s))  Resp Panel by RT-PCR (Flu A&B, Covid) Nasopharyngeal Swab     Status: None   Collection Time: 05/13/21 12:54 AM   Specimen: Nasopharyngeal Swab; Nasopharyngeal(NP) swabs in vial transport medium  Result Value Ref Range   SARS Coronavirus 2 by RT PCR NEGATIVE NEGATIVE    Comment: (NOTE) SARS-CoV-2 target nucleic acids are  NOT DETECTED.  The SARS-CoV-2 RNA is generally detectable in upper respiratory specimens during the acute phase of infection. The lowest concentration of SARS-CoV-2 viral copies this assay can detect is 138 copies/mL. A negative result does not preclude  SARS-Cov-2 infection and should not be used as the sole basis for treatment or other patient management decisions. A negative result may occur with  improper specimen collection/handling, submission of specimen other than nasopharyngeal swab, presence of viral mutation(s) within the areas targeted by this assay, and inadequate number of viral copies(<138 copies/mL). A negative result must be combined with clinical observations, patient history, and epidemiological information. The expected result is Negative.  Fact Sheet for Patients:  EntrepreneurPulse.com.au  Fact Sheet for Healthcare Providers:  IncredibleEmployment.be  This test is no t yet approved or cleared by the [place] FDA and  has been authorized for detection and/or diagnosis of SARS-CoV-2 by FDA under an Emergency Use Authorization (EUA). This EUA will remain  in effect (meaning this test can be used) for the duration of the COVID-19 declaration under Section 564(b)(1) of the Act, 21 U.S.C.section 360bbb-3(b)(1), unless the authorization is terminated  or revoked sooner.       Influenza A by PCR NEGATIVE NEGATIVE   Influenza B by PCR NEGATIVE NEGATIVE    Comment: (NOTE) The Xpert Xpress SARS-CoV-2/FLU/RSV plus assay is intended as an aid in the diagnosis of influenza from Nasopharyngeal swab specimens and should not be used as a sole basis for treatment. Nasal washings and aspirates are unacceptable for Xpert Xpress SARS-CoV-2/FLU/RSV testing.  Fact Sheet for Patients: EntrepreneurPulse.com.au  Fact Sheet for Healthcare Providers: IncredibleEmployment.be  This test is not yet approved or cleared by the [place] FDA and has been authorized for detection and/or diagnosis of SARS-CoV-2 by FDA under an Emergency Use Authorization (EUA). This EUA will remain in effect (meaning this test can be used) for the duration of the COVID-19  declaration under Section 564(b)(1) of the Act, 21 U.S.C. section 360bbb-3(b)(1), unless the authorization is terminated or revoked.  Performed at [facility], [place] [address], [place], [place] [address]   CBC with Differential/Platelet     Status: Abnormal   Collection Time: 05/13/21  1:02 AM  Result Value Ref Range   WBC 8.7 4.0 - 10.5 K/uL   RBC 4.20 (L) 4.22 - 5.81 MIL/uL   Hemoglobin 12.2 (L) 13.0 - 17.0 g/dL   HCT 36.8 (L) 39.0 - 52.0 %   MCV 87.6 80.0 - 100.0 fL   MCH 29.0 26.0 - 34.0 pg   MCHC 33.2 30.0 - 36.0 g/dL   RDW 16.4 (H) 11.5 - 15.5 %   Platelets 325 150 - 400 K/uL   nRBC 0.0 0.0 - 0.2 %   Neutrophils Relative % 66 %   Neutro Abs 5.8 1.7 - 7.7 K/uL   Lymphocytes Relative 21 %   Lymphs Abs 1.8 0.7 - 4.0 K/uL   Monocytes Relative 11 %   Monocytes Absolute 0.9 0.1 - 1.0 K/uL   Eosinophils Relative 1 %   Eosinophils Absolute 0.1 0.0 - 0.5 K/uL   Basophils Relative 0 %   Basophils Absolute 0.0 0.0 - 0.1 K/uL   Immature Granulocytes 1 %   Abs Immature Granulocytes 0.04 0.00 - 0.07 K/uL    Comment: Performed at [facility] [address], [place], [place] [address]  Comprehensive metabolic panel     Status: Abnormal   Collection Time: 05/13/21  1:02 AM  Result Value  Ref Range   Sodium 132 (L) 135 - 145 mmol/L   Potassium 4.5 3.5 - 5.1 mmol/L    Comment: SPECIMEN HEMOLYZED. HEMOLYSIS MAY AFFECT INTEGRITY OF RESULTS.   Chloride 100 98 - 111 mmol/L   CO2 23 22 - 32 mmol/L   Glucose, Bld 111 (H) 70 - 99 mg/dL    Comment: Glucose reference range applies only to samples taken after fasting for at least 8 hours.   BUN 9 8 - 23 mg/dL   Creatinine, Ser 0.65 0.61 - 1.24 mg/dL   Calcium 8.8 (L) 8.9 - 10.3 mg/dL   Total Protein 5.9 (L) 6.5 - 8.1 g/dL   Albumin 3.1 (L) 3.5 - 5.0 g/dL   AST 38 15 - 41 U/L   ALT 23 0 - 44 U/L   Alkaline Phosphatase 33 (L) 38 - 126 U/L   Total Bilirubin 1.4 (H) 0.3 - 1.2 mg/dL   GFR, Estimated >60 >60 mL/min    Comment:  (NOTE) Calculated using the CKD-EPI Creatinine Equation (2021)    Anion gap 9 5 - 15    Comment: Performed at [facility], [place] [address] [place], [place] [address]  Troponin I (High Sensitivity)     Status: Abnormal   Collection Time: 05/13/21  1:02 AM  Result Value Ref Range   Troponin I (High Sensitivity) 206 (HH) <18 ng/L    Comment: CRITICAL RESULT CALLED TO, READ BACK BY AND VERIFIED WITH: THOMPSON A,RN 05/13/21 0226 WAYK (NOTE) Elevated high sensitivity troponin I (hsTnI) values and significant  changes across serial measurements may suggest ACS but many other  chronic and acute conditions are known to elevate hsTnI results.  Refer to the Links section for chest pain algorithms and additional  guidance. Performed at [facility], [place] [address], [place], [place] [address]   I-stat chem 8, ED (not at [facility] or [facility])     Status: Abnormal   Collection Time: 05/13/21  1:31 AM  Result Value Ref Range   Sodium 133 (L) 135 - 145 mmol/L   Potassium 4.7 3.5 - 5.1 mmol/L   Chloride 101 98 - 111 mmol/L   BUN 10 8 - 23 mg/dL   Creatinine, Ser 0.60 (L) 0.61 - 1.24 mg/dL   Glucose, Bld 109 (H) 70 - 99 mg/dL    Comment: Glucose reference range applies only to samples taken after fasting for at least 8 hours.   Calcium, Ion 1.17 1.15 - 1.40 mmol/L   TCO2 25 22 - 32 mmol/L   Hemoglobin 13.3 13.0 - 17.0 g/dL   HCT 39.0 39.0 - 52.0 %  Troponin I (High Sensitivity)     Status: Abnormal   Collection Time: 05/13/21  3:16 AM  Result Value Ref Range   Troponin I (High Sensitivity) 524 (HH) <18 ng/L    Comment: CRITICAL VALUE NOTED.  VALUE IS CONSISTENT WITH PREVIOUSLY REPORTED AND CALLED VALUE. (NOTE) Elevated high sensitivity troponin I (hsTnI) values and significant  changes across serial measurements may suggest ACS but many other  chronic and acute conditions are known to elevate hsTnI results.  Refer to the Links section for chest pain algorithms and additional   guidance. Performed at [facility], [place] [address] [place], [place] [address]   Basic metabolic panel     Status: Abnormal   Collection Time: 05/13/21  5:54 AM  Result Value Ref Range   Sodium 134 (L) 135 - 145 mmol/L   Potassium 4.0 3.5 -  5.1 mmol/L   Chloride 106 98 - 111 mmol/L   CO2 24 22 - 32 mmol/L   Glucose, Bld 111 (H) 70 - 99 mg/dL    Comment: Glucose reference range applies only to samples taken after fasting for at least 8 hours.   BUN 9 8 - 23 mg/dL   Creatinine, Ser 0.73 0.61 - 1.24 mg/dL   Calcium 8.6 (L) 8.9 - 10.3 mg/dL   GFR, Estimated >60 >60 mL/min    Comment: (NOTE) Calculated using the CKD-EPI Creatinine Equation (2021)    Anion gap 4 (L) 5 - 15    Comment: Performed at [facility] [address], [place], [place] [address]  CBC     Status: Abnormal   Collection Time: 05/13/21  5:54 AM  Result Value Ref Range   WBC 9.3 4.0 - 10.5 K/uL   RBC 3.93 (L) 4.22 - 5.81 MIL/uL   Hemoglobin 11.5 (L) 13.0 - 17.0 g/dL   HCT 34.8 (L) 39.0 - 52.0 %   MCV 88.5 80.0 - 100.0 fL   MCH 29.3 26.0 - 34.0 pg   MCHC 33.0 30.0 - 36.0 g/dL   RDW 16.3 (H) 11.5 - 15.5 %   Platelets 274 150 - 400 K/uL   nRBC 0.0 0.0 - 0.2 %    Comment: Performed at [facility], [place] [address], [place], [place] [address]  Troponin I (High Sensitivity)     Status: Abnormal   Collection Time: 05/13/21  8:47 AM  Result Value Ref Range   Troponin I (High Sensitivity) 1,256 (HH) <18 ng/L    Comment: CRITICAL VALUE NOTED.  VALUE IS CONSISTENT WITH PREVIOUSLY REPORTED AND CALLED VALUE. (NOTE) Elevated high sensitivity troponin I (hsTnI) values and significant  changes across serial measurements may suggest ACS but many other  chronic and acute conditions are known to elevate hsTnI results.  Refer to the Links section for chest pain algorithms and additional  guidance. Performed at [facility], [place] [address], [place], [place] [address]   Heparin level  (unfractionated)     Status: None   Collection Time: 05/13/21 10:59 AM  Result Value Ref Range   Heparin Unfractionated 0.40 0.30 - 0.70 IU/mL    Comment: (NOTE) The clinical reportable range upper limit is being lowered to >1.10 to align with the FDA approved guidance for the current laboratory assay.  If heparin results are below expected values, and patient dosage has  been confirmed, suggest follow up testing of antithrombin III levels. Performed at [facility], [place] [address] [place], [place] [address]   POCT Activated clotting time     Status: None   Collection Time: 05/13/21 12:29 PM  Result Value Ref Range   Activated Clotting Time 305 seconds    Comment: Reference range 74-137 seconds for patients not on anticoagulant therapy.  POCT Activated clotting time     Status: None   Collection Time: 05/13/21  1:10 PM  Result Value Ref Range   Activated Clotting Time 215 seconds    Comment: Reference range 74-137 seconds for patients not on anticoagulant therapy.  POCT Activated clotting time     Status: None   Collection Time: 05/13/21  1:40 PM  Result Value Ref Range   Activated Clotting Time 191 seconds    Comment: Reference range 74-137 seconds for patients not on anticoagulant therapy.  CBC     Status: Abnormal   Collection Time: 05/14/21 12:36 AM  Result  Value Ref Range   WBC 10.2 4.0 - 10.5 K/uL   RBC 3.58 (L) 4.22 - 5.81 MIL/uL   Hemoglobin 10.6 (L) 13.0 - 17.0 g/dL   HCT 31.3 (L) 39.0 - 52.0 %   MCV 87.4 80.0 - 100.0 fL   MCH 29.6 26.0 - 34.0 pg   MCHC 33.9 30.0 - 36.0 g/dL   RDW 16.6 (H) 11.5 - 15.5 %   Platelets 247 150 - 400 K/uL   nRBC 0.0 0.0 - 0.2 %    Comment: Performed at [facility] [address] [place], [place] [address]  Lipid panel     Status: None   Collection Time: 05/14/21 12:36 AM  Result Value Ref Range   Cholesterol 137 0 - 200 mg/dL   Triglycerides 119 <150 mg/dL   HDL 48 >40 mg/dL   Total CHOL/HDL Ratio 2.9 RATIO   VLDL  24 0 - 40 mg/dL   LDL Cholesterol 65 0 - 99 mg/dL    Comment:        Total Cholesterol/HDL:CHD Risk Coronary Heart Disease Risk Table                     Men   Women  1/2 Average Risk   3.4   3.3  Average Risk       5.0   4.4  2 X Average Risk   9.6   7.1  3 X Average Risk  23.4   11.0        Use the calculated Patient Ratio above and the CHD Risk Table to determine the patient's CHD Risk.        ATP III CLASSIFICATION (LDL):  <100     mg/dL   Optimal  100-129  mg/dL   Near or Above                    Optimal  130-159  mg/dL   Borderline  160-189  mg/dL   High  >190     mg/dL   Very High Performed at [facility] [address], [place], [place] [address]   Hemoglobin A1c     Status: Abnormal   Collection Time: 05/14/21 12:36 AM  Result Value Ref Range   Hgb A1c MFr Bld 6.8 (H) 4.8 - 5.6 %    Comment: (NOTE) Pre diabetes:          5.7%-6.4%  Diabetes:              >6.4%  Glycemic control for   <7.0% adults with diabetes    Mean Plasma Glucose 148.46 mg/dL    Comment: Performed at [facility] [address], [place], [place] [address]  Basic metabolic panel     Status: Abnormal   Collection Time: 05/14/21 12:36 AM  Result Value Ref Range   Sodium 134 (L) 135 - 145 mmol/L   Potassium 3.6 3.5 - 5.1 mmol/L   Chloride 103 98 - 111 mmol/L   CO2 22 22 - 32 mmol/L   Glucose, Bld 120 (H) 70 - 99 mg/dL    Comment: Glucose reference range applies only to samples taken after fasting for at least 8 hours.   BUN 13 8 - 23 mg/dL   Creatinine, Ser 0.86 0.61 - 1.24 mg/dL   Calcium 8.5 (L) 8.9 - 10.3 mg/dL   GFR, Estimated >60 >60 mL/min    Comment: (NOTE) Calculated using the CKD-EPI Creatinine Equation (2021)  Anion gap 9 5 - 15    Comment: Performed at [facility] [address] [place], [place] [address]  Magnesium     Status: None   Collection Time: 05/14/21 12:36 AM  Result Value Ref Range   Magnesium 1.9 1.7 - 2.4 mg/dL    Comment: Performed  at [facility] [address], [place], [place] [address]  ECHOCARDIOGRAM COMPLETE     Status: None   Collection Time: 05/14/21 11:52 AM  Result Value Ref Range   Weight 2,641.6 oz   Height 64 in   BP 122/78 mmHg   S' Lateral 4.00 cm   AR max vel 2.55 cm2   AV Peak grad 5.3 mmHg   Ao pk vel 1.15 m/s   Area-P 1/2 3.99 cm2  Basic metabolic panel     Status: Abnormal   Collection Time: 05/15/21  6:57 AM  Result Value Ref Range   Sodium 136 135 - 145 mmol/L   Potassium 3.4 (L) 3.5 - 5.1 mmol/L   Chloride 103 98 - 111 mmol/L   CO2 23 22 - 32 mmol/L   Glucose, Bld 131 (H) 70 - 99 mg/dL    Comment: Glucose reference range applies only to samples taken after fasting for at least 8 hours.   BUN 10 8 - 23 mg/dL   Creatinine, Ser 0.79 0.61 - 1.24 mg/dL   Calcium 8.8 (L) 8.9 - 10.3 mg/dL   GFR, Estimated >60 >60 mL/min    Comment: (NOTE) Calculated using the CKD-EPI Creatinine Equation (2021)    Anion gap 10 5 - 15    Comment: Performed at [facility] [address] [place], [place] [address]  CBC     Status: Abnormal   Collection Time: 05/15/21  6:57 AM  Result Value Ref Range   WBC 8.2 4.0 - 10.5 K/uL   RBC 3.98 (L) 4.22 - 5.81 MIL/uL   Hemoglobin 11.6 (L) 13.0 - 17.0 g/dL   HCT 34.9 (L) 39.0 - 52.0 %   MCV 87.7 80.0 - 100.0 fL   MCH 29.1 26.0 - 34.0 pg   MCHC 33.2 30.0 - 36.0 g/dL   RDW 16.4 (H) 11.5 - 15.5 %   Platelets 246 150 - 400 K/uL   nRBC 0.0 0.0 - 0.2 %    Comment: Performed at [facility], [place] [address], [place], [place] [address]  Basic metabolic panel     Status: Abnormal   Collection Time: 06/04/21 11:10 AM  Result Value Ref Range   Glucose 110 (H) 70 - 99 mg/dL   BUN 6 (L) 8 - 27 mg/dL   Creatinine, Ser 0.77 0.76 - 1.27 mg/dL   eGFR 96 >59 mL/min/1.73   BUN/Creatinine Ratio 8 (L) 10 - 24   Sodium 132 (L) 134 - 144 mmol/L   Potassium 4.6 3.5 - 5.2 mmol/L   Chloride 94 (L) 96 - 106 mmol/L   CO2 22 20 - 29 mmol/L   Calcium 9.6 8.6 -  10.2 mg/dL    Radiology No results found.  Assessment/Plan Essential hypertension blood pressure control important in reducing the progression of atherosclerotic disease and aneurysmal growth. On appropriate oral medications.     Hyperlipidemia LDL goal <70 lipid control important in reducing the progression of atherosclerotic disease. Continue statin therapy  Coronary artery disease involving native coronary artery of native heart with angina pectoris [facility]) Has had a heart attack since his last visit  AAA (abdominal  aortic aneurysm) without rupture Duplex today shows a stable 4.53 cm infrarenal abdominal aortic aneurysm without significant growth from his previous study.  No role for repair at this size.  Recheck in 6 months.    Leotis Pain, MD  06/19/2021 10:07 AM    This note was created with Dragon medical transcription system.  Any errors from dictation are purely unintentional

## 2021-06-19 NOTE — Assessment & Plan Note (Signed)
Has had a heart attack since his last visit

## 2021-06-19 NOTE — Assessment & Plan Note (Signed)
Duplex today shows a stable 4.53 cm infrarenal abdominal aortic aneurysm without significant growth from his previous study.  No role for repair at this size.  Recheck in 6 months.

## 2021-06-22 ENCOUNTER — Encounter: Payer: Medicare Other | Admitting: *Deleted

## 2021-06-22 ENCOUNTER — Other Ambulatory Visit: Payer: Self-pay

## 2021-06-22 DIAGNOSIS — Z5189 Encounter for other specified aftercare: Secondary | ICD-10-CM | POA: Diagnosis not present

## 2021-06-22 DIAGNOSIS — I214 Non-ST elevation (NSTEMI) myocardial infarction: Secondary | ICD-10-CM | POA: Diagnosis not present

## 2021-06-22 NOTE — Progress Notes (Signed)
Daily Session Note ° °Patient Details  °Name: [name] °MRN: [id number] °Date of Birth: 08/21/1950 °Referring Provider:   °Flowsheet Row Cardiac Rehab from 06/09/2021 in [facility]  °Referring Provider Gollan, Timothy MD  ° °  ° ° °Encounter Date: 06/22/2021 ° °Check In: ° Session Check In - 06/22/21 1028   ° °  ° Check-In  ° Supervising physician immediately available to respond to emergencies See telemetry face sheet for immediately available ER MD   ° Location [facility]   ° Staff Present Meredith Craven, RN BSN;Kelly Hayes, BS, ACSM CEP, Exercise Physiologist;Amanda Sommer, BA, ACSM CEP, Exercise Physiologist   ° Virtual Visit No   ° Medication changes reported     No   ° Fall or balance concerns reported    No   ° Warm-up and Cool-down Performed on first and last piece of equipment   ° Resistance Training Performed Yes   ° VAD Patient? No   ° PAD/SET Patient? No   °  ° Pain Assessment  ° Currently in Pain? No/denies   ° °  °  ° °  ° ° ° ° ° °Social History  ° °Tobacco Use  °Smoking Status Former  ° Packs/day: 3.00  ° Years: 25.00  ° Pack years: 75.00  ° Types: Cigarettes  ° Quit date: 05/28/1993  ° Years since quitting: 28.0  ° Passive exposure: Past  °Smokeless Tobacco Never  ° ° °Goals Met:  °Independence with exercise equipment °Exercise tolerated well °No report of concerns or symptoms today °Strength training completed today ° °Goals Unmet:  °Not Applicable ° °Comments: First full day of exercise!  Patient was oriented to gym and equipment including functions, settings, policies, and procedures.  Patient's individual exercise prescription and treatment plan were reviewed.  All starting workloads were established based on the results of the 6 minute walk test done at initial orientation visit.  The plan for exercise progression was also introduced and progression will be customized based on patient's performance and goals. ° ° ° ° °Dr. Mark Miller is Medical  Director for [facility].  °Dr. Fuad Aleskerov is Medical Director for [facility]. °

## 2021-06-24 ENCOUNTER — Encounter: Payer: Medicare Other | Attending: Cardiovascular Disease

## 2021-06-24 ENCOUNTER — Other Ambulatory Visit: Payer: Self-pay

## 2021-06-24 DIAGNOSIS — I214 Non-ST elevation (NSTEMI) myocardial infarction: Secondary | ICD-10-CM | POA: Insufficient documentation

## 2021-06-24 NOTE — Progress Notes (Signed)
Daily Session Note ? ?Patient Details  ?Name: [name] ?MRN: [id number] ?Date of Birth: 07/16/1950 ?Referring Provider:   ?Flowsheet Row Cardiac Rehab from 06/09/2021 in [facility] Cardiac and Pulmonary Rehab  ?Referring Provider [name]  ? ?  ? ? ?Encounter Date: 06/24/2021 ? ?Check In: ? Session Check In - 06/24/21 0957   ? ?  ? Check-In  ? Supervising physician immediately available to respond to emergencies See telemetry face sheet for immediately available ER MD   ? Location [facility]   ? Staff Present Birdie Sons, MPA, RN;Amanda Sommer, BA, ACSM CEP, Exercise Physiologist;Joseph [place], [place]   ? Virtual Visit No   ? Medication changes reported     No   ? Fall or balance concerns reported    No   ? Warm-up and Cool-down Performed on first and last piece of equipment   ? Resistance Training Performed Yes   ? VAD Patient? No   ? PAD/SET Patient? No   ?  ? Pain Assessment  ? Currently in Pain? No/denies   ? ?  ?  ? ?  ? ? ? ? ? ?Social History  ? ?Tobacco Use  ?Smoking Status Former  ? Packs/day: 3.00  ? Years: 25.00  ? Pack years: 75.00  ? Types: Cigarettes  ? Quit date: 05/28/1993  ? Years since quitting: 28.0  ? Passive exposure: Past  ?Smokeless Tobacco Never  ? ? ?Goals Met:  ?Independence with exercise equipment ?Exercise tolerated well ?No report of concerns or symptoms today ?Strength training completed today ? ?Goals Unmet:  ?Not Applicable ? ?Comments: Pt able to follow exercise prescription today without complaint.  Will continue to monitor for progression. ? ? ? ?Dr. Emily Filbert is Medical Director for [place].  ?Dr. Ottie Glazier is Medical Director for [facility] Pulmonary Rehabilitation. ?

## 2021-06-26 ENCOUNTER — Encounter: Payer: Medicare Other | Admitting: *Deleted

## 2021-06-26 ENCOUNTER — Other Ambulatory Visit: Payer: Self-pay

## 2021-06-26 DIAGNOSIS — I214 Non-ST elevation (NSTEMI) myocardial infarction: Secondary | ICD-10-CM

## 2021-06-26 NOTE — Progress Notes (Signed)
Daily Session Note ? ?Patient Details  ?Name: [name] ?MRN: [id number] ?Date of Birth: 14-Dec-1950 ?Referring Provider:   ?Flowsheet Row Cardiac Rehab from 06/09/2021 in [facility] Cardiac and Pulmonary Rehab  ?Referring Provider [name]  ? ?  ? ? ?Encounter Date: 06/26/2021 ? ?Check In: ? Session Check In - 06/26/21 0943   ? ?  ? Check-In  ? Supervising physician immediately available to respond to emergencies See telemetry face sheet for immediately available ER MD   ? Location [facility]   ? Staff Present Renita Papa, RN BSN;Joseph Neola, RCP,RRT,BSRT;Jessica [place], [place], [place], [place], CCET   ? Virtual Visit No   ? Medication changes reported     No   ? Fall or balance concerns reported    No   ? Warm-up and Cool-down Performed on first and last piece of equipment   ? Resistance Training Performed Yes   ? VAD Patient? No   ? PAD/SET Patient? No   ?  ? Pain Assessment  ? Currently in Pain? No/denies   ? ?  ?  ? ?  ? ? ? ? ? ?Social History  ? ?Tobacco Use  ?Smoking Status Former  ? Packs/day: 3.00  ? Years: 25.00  ? Pack years: 75.00  ? Types: Cigarettes  ? Quit date: 05/28/1993  ? Years since quitting: 28.0  ? Passive exposure: Past  ?Smokeless Tobacco Never  ? ? ?Goals Met:  ?Independence with exercise equipment ?Exercise tolerated well ?No report of concerns or symptoms today ?Strength training completed today ? ?Goals Unmet:  ?Not Applicable ? ?Comments: Pt able to follow exercise prescription today without complaint.  Will continue to monitor for progression. ? ? ? ?Dr. Emily Filbert is Medical Director for [place].  ?Dr. Ottie Glazier is Medical Director for [facility] Pulmonary Rehabilitation. ?

## 2021-06-29 ENCOUNTER — Encounter: Payer: Medicare Other | Admitting: *Deleted

## 2021-06-29 ENCOUNTER — Other Ambulatory Visit: Payer: Self-pay

## 2021-06-29 DIAGNOSIS — I214 Non-ST elevation (NSTEMI) myocardial infarction: Secondary | ICD-10-CM

## 2021-06-29 NOTE — Progress Notes (Signed)
Daily Session Note ? ?Patient Details  ?Name: BERND CROM ?MRN: [id number] ?Date of Birth: 04/27/1950 ?Referring Provider:   ?Flowsheet Row Cardiac Rehab from 06/09/2021 in [facility] Cardiac and Pulmonary Rehab  ?Referring Provider [name]  ? ?  ? ? ?Encounter Date: 06/29/2021 ? ?Check In: ? Session Check In - 06/29/21 0947   ? ?  ? Check-In  ? Supervising physician immediately available to respond to emergencies See telemetry face sheet for immediately available ER MD   ? Location [facility]   ? Staff Present Heath Lark, RN, BSN, Laveda [place], BS, ACSM CEP, Exercise Physiologist;Joseph [place], [place]   ? Virtual Visit Yes   ? Medication changes reported     No   ? Fall or balance concerns reported    No   ? Warm-up and Cool-down Performed on first and last piece of equipment   ? Resistance Training Performed Yes   ? VAD Patient? No   ? PAD/SET Patient? No   ?  ? Pain Assessment  ? Currently in Pain? No/denies   ? ?  ?  ? ?  ? ? ? ? ? ?Social History  ? ?Tobacco Use  ?Smoking Status Former  ? Packs/day: 3.00  ? Years: 25.00  ? Pack years: 75.00  ? Types: Cigarettes  ? Quit date: 05/28/1993  ? Years since quitting: 28.1  ? Passive exposure: Past  ?Smokeless Tobacco Never  ? ? ?Goals Met:  ?Independence with exercise equipment ?Exercise tolerated well ?No report of concerns or symptoms today ? ?Goals Unmet:  ?Not Applicable ? ?Comments: Pt able to follow exercise prescription today without complaint.  Will continue to monitor for progression. ? ? ? ?Dr. Emily Filbert is Medical Director for [place].  ?Dr. Ottie Glazier is Medical Director for [facility] Pulmonary Rehabilitation. ?

## 2021-06-30 ENCOUNTER — Ambulatory Visit (INDEPENDENT_AMBULATORY_CARE_PROVIDER_SITE_OTHER): Payer: Medicare Other | Admitting: Cardiovascular Disease

## 2021-06-30 ENCOUNTER — Other Ambulatory Visit: Payer: Self-pay

## 2021-06-30 ENCOUNTER — Encounter: Payer: Self-pay | Admitting: Cardiovascular Disease

## 2021-06-30 VITALS — BP 140/80 | HR 64 | Ht 63.0 in | Wt 166.4 lb

## 2021-06-30 DIAGNOSIS — I25119 Atherosclerotic heart disease of native coronary artery with unspecified angina pectoris: Secondary | ICD-10-CM | POA: Diagnosis not present

## 2021-06-30 DIAGNOSIS — I700 Atherosclerosis of aorta: Secondary | ICD-10-CM

## 2021-06-30 DIAGNOSIS — I701 Atherosclerosis of renal artery: Secondary | ICD-10-CM | POA: Diagnosis not present

## 2021-06-30 DIAGNOSIS — I255 Ischemic cardiomyopathy: Secondary | ICD-10-CM | POA: Diagnosis not present

## 2021-06-30 DIAGNOSIS — I10 Essential (primary) hypertension: Secondary | ICD-10-CM | POA: Diagnosis not present

## 2021-06-30 DIAGNOSIS — I714 Abdominal aortic aneurysm, without rupture, unspecified: Secondary | ICD-10-CM

## 2021-06-30 DIAGNOSIS — I5032 Chronic diastolic (congestive) heart failure: Secondary | ICD-10-CM | POA: Diagnosis not present

## 2021-06-30 NOTE — Progress Notes (Signed)
Date:  06/30/2021   ID:  [name], DOB Jan 22, 1951, MRN [id number]  Patient Location:  [address] [place] [address]   Provider location:   [facility], [place] office  PCP:  Venia Carbon, MD  Cardiologist:  Arvid Right [facility]   Chief Complaint  Patient presents with   1 month follow up     Follow up s/p cardiac cath 05/13/2021. "Doing well." Medications reviewed by the patient verbally.     History of Present Illness:    JAMIEON LANNEN is a 71 y.o. male  past medical history of coronary artery disease, bypass surgery in 1995,  stenting at [facility] in February 2004 for MI with a 3.0 x 25 mm and 4.5 x 16 mm Monorail ( location uncertain), also 4.5 x 18 mm stent placed to the mid RCA, followup with repeat stenting in April 2005 to the proximal LAD with a Taxus stent 2.5 x 8 mm, and stenting to the proximal left circumflex with a 2.5 x 12 mm Taxus, with  stent placed November 13, 2010 with a 4.0 x 9 mm stent placed to the left main. renal artery stent.  smoker though stopped in 1995. Prior to that he smoked 3 packs per day. Smoked for approximately 25-28 years, 3 ppd, quit 1995 4.2 cm AAA on CT scan 10/2019 He presents for routine followup of his coronary artery disease.  Last seen in clinic March 2022 Seen by one of our other providers September 2022  NSTEMI: 04/2021, hospital records reviewed Cath: High-grade proximal LAD lesion which perfuses a large septal; the LIMA to the LAD does not backfill just septal.  Failed PCI due to inability to cross and likely represents a chronic total occlusion. 2.  Moderate in-stent restenosis of proximal left circumflex with RFR of 0.92; PCI was deferred. 3.  Sequential vein graft to OM to PDA, vein graft to OM,, and native right coronary artery were not imaged as they are known occluded. Aggressive medical management was recommended  In follow-up, reports that he is losing weight  through an aggressive diet Recent lab work reviewed A1C 6.8 LDL 63  Echo reviewed  Left ventricular ejection fraction, by estimation, is 45 to 50%  Carotid ultrasound October 2021 with 40 to 59% disease/stenosis bilaterally,  EKG personally reviewed by myself on todays visit NSR rate 64 bpm, no ST or T wave changes, old inferior MI  Other past medical history reviewed Aorta u/s 10/2018 Stable size, 4.2 cm   Cardiac cath 10/25/2017 No stents Known Severe Multi-vessel CAD: known CTO of ostRCA, mLAD & SVG-AM-PDA, SVG-OM. Widely patent LIMA-LAD - retograde fills to 100% CTO, antegrade flow brisk to the Apex. Prox LM ~40% (stable lesion just prior to the stent) followed by widely patent stents running from pLM-ost-proxCx-OM with brisk flow in both the distal OM & AV Groove Cx --> collaterals to RPL system. Mldly reduced LVEF with basal-mid Inferior Akinesis. Normal/Low LVEDP.    Past Medical History:  Diagnosis Date   AAA (abdominal aortic aneurysm)    a. Duplex 01/2012: stable infrarenal saccular AAA at 3.3cm x 3.2xm (f/u recommended 01/2013 per Dr. Rockey Situ)   Alcohol abuse    CAD (coronary artery disease)    a. 1995 s/p CABG x 4: LIMA->LAD, VG->RI (known to be occluded), VG->AM->PDA;  b. 05/2002 Inf STEMI: VG->AM->PDA 100% treated w/ 2 BMS complicated by acute thrombosis req 3 BMS;  c. 07/2003 DES to  LAD & LCX (VG's to PDA & RI 100%);  d. 12/2010 Acute MI ([place]): DES to LCX & LM , LIMA ok,;  e.12/2011 Cath: LM/LCX stents ok , LIMA patent.   Cardiomyopathy ([place])    a. EF 35% by cath 2015.   Diverticulitis    1/06 Diverticulitis--CT of pelvis--diffuse sigmoid divertic   Dyspnea    GERD (gastroesophageal reflux disease)    Hyperlipidemia    Hypertension    Myocardial infarction [facility])    PAD (peripheral artery disease) ([place])    a. external iliac and mesenteric stenosis noted by noninvasive imaging.   Renal artery stenosis ([place])    a. 03/2011 PTA and stenting of L RA. b. last duplex 2016  with stable 1-59% bilateral RAS, incidental >50% R EIA stenosis   Past Surgical History:  Procedure Laterality Date   [place]     8/09  Cath--vein graft occlusions which are old--no acute changes   CARDIAC CATHETERIZATION  11/13/2010   stent x 2 @ [place]   CARDIAC CATHETERIZATION  04/05/2014   stent placement    CARDIAC CATHETERIZATION  10/28/2017   CLOSED REDUCTION SHOULDER DISLOCATION     COLONOSCOPY WITH PROPOFOL N/A 12/22/2018   Procedure: COLONOSCOPY WITH PROPOFOL;  Surgeon: Jonathon Bellows, MD;  Location: [facility] ENDOSCOPY;  Service: Gastroenterology;  Laterality: N/A;   CORONARY ANGIOPLASTY  04/05/2014   stent placement OM 1   CORONARY ARTERY BYPASS GRAFT     CORONARY STENT PLACEMENT  7/12   2 stents--Promus element plus (everolimus eluting)--[facility] in [place] WIRE/FFR STUDY N/A 05/13/2021   Procedure: INTRAVASCULAR PRESSURE WIRE/FFR STUDY;  Surgeon: Early Osmond, MD;  Location: [facility] CV LAB;  Service: Cardiovascular;  Laterality: N/A;   LEFT HEART CATH AND CORONARY ANGIOGRAPHY N/A 05/13/2021   Procedure: LEFT HEART CATH AND CORONARY ANGIOGRAPHY;  Surgeon: Early Osmond, MD;  Location: [place] CV LAB;  Service: Cardiovascular;  Laterality: N/A;   LEFT HEART CATH AND CORS/GRAFTS ANGIOGRAPHY N/A 10/28/2017   Procedure: LEFT HEART CATH AND CORS/GRAFTS ANGIOGRAPHY;  Surgeon: Leonie Man, MD;  Location: [place] CV LAB;  Service: Cardiovascular;  Laterality: N/A;   LEFT HEART CATHETERIZATION WITH CORONARY/GRAFT ANGIOGRAM N/A 01/04/2012   Procedure: LEFT HEART CATHETERIZATION WITH Beatrix Fetters;  Surgeon: Burnell Blanks, MD;  Location: [facility] CATH LAB;  Service: Cardiovascular;  Laterality: N/A;   LEFT HEART CATHETERIZATION WITH CORONARY/GRAFT ANGIOGRAM N/A 04/05/2014   Procedure: LEFT HEART CATHETERIZATION WITH Beatrix Fetters;  Surgeon: Jettie Booze, MD;  Location: [facility]  CATH LAB;  Service: Cardiovascular;  Laterality: N/A;   PERCUTANEOUS CORONARY STENT INTERVENTION (PCI-S)  04/05/2014   Procedure: PERCUTANEOUS CORONARY STENT INTERVENTION (PCI-S);  Surgeon: Jettie Booze, MD;  [facility] CATH LAB;  Service: Cardiovascular;;  OM1   RENAL ANGIOGRAM N/A 04/16/2011   Procedure: RENAL ANGIOGRAM;  Surgeon: Burnell Blanks, MD;  Location: [facility] CATH LAB;  Service: Cardiovascular;  Laterality: N/A;   RENAL ARTERY STENT  03/2011 ?     Allergies:   Metoprolol tartrate, Quinolones, and Metoclopramide   Social History   Tobacco Use   Smoking status: Former    Packs/day: 3.00    Years: 25.00    Pack years: 75.00    Types: Cigarettes    Quit date: 05/28/1993    Years since quitting: 28.1    Passive exposure: Past   Smokeless tobacco: Never  Vaping Use   Vaping Use: Never used  Substance Use Topics   Alcohol use: Yes    Alcohol/week: 42.0 standard drinks    Types: 42 Cans of beer per week   Drug use: No     Current Outpatient Medications on File Prior to Visit  Medication Sig Dispense Refill   aspirin 81 MG tablet Take 81 mg by mouth daily.     BLACK ELDERBERRY PO Take 1 each by mouth daily.     carvedilol (COREG) 3.125 MG tablet TAKE 1 TABLET (3.125 MG TOTAL) BY MOUTH 2 (TWO) TIMES DAILY. 180 tablet 3   clopidogrel (PLAVIX) 75 MG tablet TAKE 1 TABLET (75 MG TOTAL) BY MOUTH DAILY. 90 tablet 1   ezetimibe (ZETIA) 10 MG tablet TAKE 1 TABLET (10 MG TOTAL) BY MOUTH DAILY. 90 tablet 1   gabapentin (NEURONTIN) 100 MG capsule Take 100 mg by mouth 3 (three) times daily.     isosorbide mononitrate (IMDUR) 30 MG 24 hr tablet TAKE ONE a day 90 tablet 3   losartan (COZAAR) 25 MG tablet TAKE 1/2 TABLET BY MOUTH EVERY DAY 45 tablet 0   mirtazapine (REMERON) 15 MG tablet TAKE 1 TABLET BY MOUTH EVERYDAY AT BEDTIME 90 tablet 3   Multiple Vitamins-Minerals (MULTIVITAMIN WITH MINERALS) tablet Take 1 tablet by mouth daily.     nitroGLYCERIN (NITROSTAT) 0.4 MG SL  tablet PLACE 1 TABLET (0.4 MG TOTAL) UNDER THE TONGUE EVERY 5 (FIVE) MINUTES AS NEEDED FOR CHEST PAIN. 25 tablet 2   pantoprazole (PROTONIX) 40 MG tablet Take 1 tablet (40 mg total) by mouth 2 (two) times daily. 180 tablet 3   ranolazine (RANEXA) 500 MG 12 hr tablet Take 1 tablet (500 mg total) by mouth 2 (two) times daily. 180 tablet 3   rosuvastatin (CRESTOR) 40 MG tablet TAKE 1 TABLET BY MOUTH EVERY DAY 90 tablet 3   tiZANidine (ZANAFLEX) 4 MG tablet Take 4 mg by mouth at bedtime.     No current facility-administered medications on file prior to visit.     Family Hx: The patient's family history includes Coronary artery disease in his brother, father, mother, and sister; Heart attack in his father, maternal grandfather, and mother; Hypertension in his mother. There is no history of Diabetes or Cancer.  ROS:   Please see the history of present illness.    Review of Systems  Constitutional: Negative.   HENT: Negative.    Respiratory: Negative.    Cardiovascular: Negative.   Gastrointestinal: Negative.   Musculoskeletal: Negative.   Neurological: Negative.   Psychiatric/Behavioral: Negative.    All other systems reviewed and are negative.   Labs/Other Tests and Data Reviewed:    Recent Labs: 05/13/2021: ALT 23 05/14/2021: Magnesium 1.9 05/15/2021: Hemoglobin 11.6; Platelets 246 06/04/2021: BUN 6; Creatinine, Ser 0.77; Potassium 4.6; Sodium 132   Recent Lipid Panel Lab Results  Component Value Date/Time   CHOL 137 05/14/2021 12:36 AM   CHOL 163 08/11/2015 09:41 AM   TRIG 119 05/14/2021 12:36 AM   HDL 48 05/14/2021 12:36 AM   HDL 46 08/11/2015 09:41 AM   CHOLHDL 2.9 05/14/2021 12:36 AM   LDLCALC 65 05/14/2021 12:36 AM   LDLCALC 63 01/07/2017 11:20 AM   LDLDIRECT 65.0 02/20/2021 10:27 AM    Wt Readings from Last 3 Encounters:  06/30/21 166 lb 6 oz (75.5 kg)  06/19/21 171 lb (77.6 kg)  06/10/21 172 lb (78 kg)     Exam:    Vital Signs: Vital signs may also be detailed in  the HPI BP 140/80 (  BP Location: Left Arm, Patient Position: Sitting, Cuff Size: Normal)    Pulse 64    Ht '5\' 3"'$  (1.6 m)    Wt 166 lb 6 oz (75.5 kg)    SpO2 99%    BMI 29.47 kg/m   Constitutional:  oriented to person, place, and time. No distress.  HENT:  Head: Grossly normal Eyes:  no discharge. No scleral icterus.  Neck: No JVD, no carotid bruits  Cardiovascular: Regular rate and rhythm, no murmurs appreciated Pulmonary/Chest: Clear to auscultation bilaterally, no wheezes or rails Abdominal: Soft.  no distension.  no tenderness.  Musculoskeletal: Normal range of motion Neurological:  normal muscle tone. Coordination normal. No atrophy Skin: Skin warm and dry Psychiatric: normal affect, pleasant  ASSESSMENT & PLAN:    Problem List Items Addressed This Visit       Cardiology Problems   Coronary artery disease involving native coronary artery of native heart with angina pectoris ([place]) - Primary   AAA (abdominal aortic aneurysm) without rupture   Essential hypertension   Relevant Orders   EKG 12-Lead   Heart failure, diastolic, chronic (HCC)   Aortic atherosclerosis (HCC)   Renal artery stenosis (HCC)  CAD Recent non-STEMI, cardiac catheterization reviewed with him in detail Aggressive medical management recommended He is exercising, denies anginal symptoms at this time No further work-up needed, no changes to his medications Reports having some hypotension at home, he will continue to monitor pressures in the setting of weight loss  PAD: AAA measuring 4.5 Previous measurement 4.6 Closely monitored by vascular  Renal artery stenosis: prior stent on left 40 to 59% disease/stenosis bilaterally  ETOH Cessation recommended  Essential hypertension Blood pressure is well controlled on today's visit. No changes made to the medications. With recent weight loss he is concerned about low blood pressure numbers at home He will continue to monitor and call us with numbers May  need to cut back on his isosorbide if hypotensive  Hyperlipidemia Cholesterol is at goal on the current lipid regimen. No changes to the medications were made.  SOB with exertion Likely component of COPD Long smoking hx, 3 ppd, quit 1995 Recommend regular walking program  Back pain Chronic issue, stable at this time   Total encounter time more than 40 minutes  Greater than 50% was spent in counseling and coordination of care with the patient   Signed, Ida Rogue, [place] [address], [place], [place] [address]

## 2021-06-30 NOTE — Patient Instructions (Addendum)
Read about jardiance/farxiga ? ? ?Medication Instructions:  ?No changes ? ?If you need a refill on your cardiac medications before your next appointment, please call your pharmacy.  ? ?Lab work: ?No new labs needed ? ?Testing/Procedures: ?No new testing needed ? ?Follow-Up: ?At [facility], you and your health needs are our priority.  As part of our continuing mission to provide you with exceptional heart care, we have created designated Provider Care Teams.  These Care Teams include your primary Cardiologist (physician) and Advanced Practice Providers (APPs -  Physician Assistants and Nurse Practitioners) who all work together to provide you with the care you need, when you need it. ? ?You will need a follow up appointment in 6 months ? ?Providers on your designated Care Team:   ?Murray Hodgkins, NP ?Christell Faith, PA-C ?Cadence Kathlen Mody, PA-C ? ?COVID-19 Vaccine Information can be found at: [email or web address].uk For questions related to vaccine distribution or appointments, please email [email or web address] or call 4370106391.  ? ?

## 2021-07-01 DIAGNOSIS — I214 Non-ST elevation (NSTEMI) myocardial infarction: Secondary | ICD-10-CM

## 2021-07-01 NOTE — Progress Notes (Signed)
Daily Session Note ? ?Patient Details  ?Name: [name] ?MRN: [id number] ?Date of Birth: 10/17/1950 ?Referring Provider:   ?Flowsheet Row Cardiac Rehab from 06/09/2021 in [facility] Cardiac and Pulmonary Rehab  ?Referring Provider [name]  ? ?  ? ? ?Encounter Date: 07/01/2021 ? ?Check In: ? Session Check In - 07/01/21 1042   ? ?  ? Check-In  ? Supervising physician immediately available to respond to emergencies See telemetry face sheet for immediately available ER MD   ? Location [facility] Rehab   ? Staff Present Birdie Sons, MPA, RN;Joseph [place], RCP,RRT,BSRT;Jessica [place], MA, RCEP, CCRP, CCET   ? Virtual Visit No   ? Medication changes reported     No   ? Fall or balance concerns reported    No   ? Warm-up and Cool-down Performed on first and last piece of equipment   ? Resistance Training Performed Yes   ? VAD Patient? No   ? PAD/SET Patient? No   ?  ? Pain Assessment  ? Currently in Pain? No/denies   ? ?  ?  ? ?  ? ? ? ? ? ?Social History  ? ?Tobacco Use  ?Smoking Status Former  ? Packs/day: 3.00  ? Years: 25.00  ? Pack years: 75.00  ? Types: Cigarettes  ? Quit date: 05/28/1993  ? Years since quitting: 28.1  ? Passive exposure: Past  ?Smokeless Tobacco Never  ? ? ?Goals Met:  ?Independence with exercise equipment ?Exercise tolerated well ?No report of concerns or symptoms today ?Strength training completed today ? ?Goals Unmet:  ?Not Applicable ? ?Comments: Pt able to follow exercise prescription today without complaint.  Will continue to monitor for progression. ? ? ? ?Dr. Emily Filbert is Medical Director for [place].  ?Dr. Ottie Glazier is Medical Director for [facility] Pulmonary Rehabilitation. ?

## 2021-07-03 ENCOUNTER — Other Ambulatory Visit: Payer: Self-pay

## 2021-07-03 ENCOUNTER — Encounter: Payer: Medicare Other | Admitting: *Deleted

## 2021-07-03 DIAGNOSIS — I214 Non-ST elevation (NSTEMI) myocardial infarction: Secondary | ICD-10-CM

## 2021-07-03 NOTE — Progress Notes (Signed)
Daily Session Note ? ?Patient Details  ?Name: [name] ?MRN: [id number] ?Date of Birth: Mar 05, 1951 ?Referring Provider:   ?Flowsheet Row Cardiac Rehab from 06/09/2021 in [facility] Cardiac and Pulmonary Rehab  ?Referring Provider [name]  ? ?  ? ? ?Encounter Date: 07/03/2021 ? ?Check In: ? Session Check In - 07/03/21 0946   ? ?  ? Check-In  ? Supervising physician immediately available to respond to emergencies See telemetry face sheet for immediately available ER MD   ? Location [facility]-Cardiac & Pulmonary Rehab   ? Staff Present Renita Papa, RN BSN;Joseph [place], RCP,RRT,BSRT;Jessica Pierron, [place], [place], [place], CCET   ? Virtual Visit No   ? Medication changes reported     No   ? Fall or balance concerns reported    No   ? Warm-up and Cool-down Performed on first and last piece of equipment   ? Resistance Training Performed Yes   ? VAD Patient? No   ? PAD/SET Patient? No   ?  ? Pain Assessment  ? Currently in Pain? No/denies   ? ?  ?  ? ?  ? ? ? ? ? ?Social History  ? ?Tobacco Use  ?Smoking Status Former  ? Packs/day: 3.00  ? Years: 25.00  ? Pack years: 75.00  ? Types: Cigarettes  ? Quit date: 05/28/1993  ? Years since quitting: 28.1  ? Passive exposure: Past  ?Smokeless Tobacco Never  ? ? ?Goals Met:  ?Independence with exercise equipment ?Exercise tolerated well ?No report of concerns or symptoms today ?Strength training completed today ? ?Goals Unmet:  ?Not Applicable ? ?Comments: Pt able to follow exercise prescription today without complaint.  Will continue to monitor for progression. ? ? ? ?Dr. Emily Filbert is Medical Director for [place].  ?Dr. Ottie Glazier is Medical Director for [facility] Pulmonary Rehabilitation. ?

## 2021-07-06 ENCOUNTER — Encounter: Payer: Medicare Other | Admitting: *Deleted

## 2021-07-06 ENCOUNTER — Other Ambulatory Visit: Payer: Self-pay

## 2021-07-06 DIAGNOSIS — I214 Non-ST elevation (NSTEMI) myocardial infarction: Secondary | ICD-10-CM | POA: Diagnosis not present

## 2021-07-06 NOTE — Progress Notes (Signed)
Daily Session Note ? ?Patient Details  ?Name: [name] ?MRN: [id number] ?Date of Birth: January 05, 1951 ?Referring Provider:   ?Flowsheet Row Cardiac Rehab from 06/09/2021 in [facility] Cardiac and Pulmonary Rehab  ?Referring Provider [name]  ? ?  ? ? ?Encounter Date: 07/06/2021 ? ?Check In: ? Session Check In - 07/06/21 1030   ? ?  ? Check-In  ? Supervising physician immediately available to respond to emergencies See telemetry face sheet for immediately available ER MD   ? Location [facility]-Cardiac & Pulmonary Rehab   ? Staff Present Renita Papa, RN Moises Blood, BS, ACSM CEP, Exercise Physiologist;Amanda Oletta Darter, [place], ACSM CEP, Exercise Physiologist   ? Virtual Visit No   ? Medication changes reported     No   ? Fall or balance concerns reported    No   ? Warm-up and Cool-down Performed on first and last piece of equipment   ? Resistance Training Performed Yes   ? VAD Patient? No   ? PAD/SET Patient? No   ?  ? Pain Assessment  ? Currently in Pain? No/denies   ? ?  ?  ? ?  ? ? ? ? ? ?Social History  ? ?Tobacco Use  ?Smoking Status Former  ? Packs/day: 3.00  ? Years: 25.00  ? Pack years: 75.00  ? Types: Cigarettes  ? Quit date: 05/28/1993  ? Years since quitting: 28.1  ? Passive exposure: Past  ?Smokeless Tobacco Never  ? ? ?Goals Met:  ?Independence with exercise equipment ?Exercise tolerated well ?No report of concerns or symptoms today ?Strength training completed today ? ?Goals Unmet:  ?Not Applicable ? ?Comments: Pt able to follow exercise prescription today without complaint.  Will continue to monitor for progression. ? ? ? ?Dr. Emily Filbert is Medical Director for [place].  ?Dr. Ottie Glazier is Medical Director for [facility] Pulmonary Rehabilitation. ?

## 2021-07-08 ENCOUNTER — Other Ambulatory Visit: Payer: Self-pay

## 2021-07-08 DIAGNOSIS — I214 Non-ST elevation (NSTEMI) myocardial infarction: Secondary | ICD-10-CM

## 2021-07-08 NOTE — Progress Notes (Signed)
Daily Session Note ? ?Patient Details  ?Name: [name] ?MRN: [id number] ?Date of Birth: 1951-04-06 ?Referring Provider:   ?Flowsheet Row Cardiac Rehab from 06/09/2021 in [facility] Cardiac and Pulmonary Rehab  ?Referring Provider [name]  ? ?  ? ? ?Encounter Date: 07/08/2021 ? ?Check In: ? Session Check In - 07/08/21 0941   ? ?  ? Check-In  ? Supervising physician immediately available to respond to emergencies See telemetry face sheet for immediately available ER MD   ? Location [facility]-Cardiac & Pulmonary Rehab   ? Staff Present Birdie Sons, MPA, RN;Jessica Luan Pulling, MA, RCEP, CCRP, CCET;Joseph Oden, [place]   ? Virtual Visit No   ? Medication changes reported     No   ? Fall or balance concerns reported    No   ? Warm-up and Cool-down Performed on first and last piece of equipment   ? Resistance Training Performed Yes   ? VAD Patient? No   ? PAD/SET Patient? No   ?  ? Pain Assessment  ? Currently in Pain? No/denies   ? ?  ?  ? ?  ? ? ? ? ? ?Social History  ? ?Tobacco Use  ?Smoking Status Former  ? Packs/day: 3.00  ? Years: 25.00  ? Pack years: 75.00  ? Types: Cigarettes  ? Quit date: 05/28/1993  ? Years since quitting: 28.1  ? Passive exposure: Past  ?Smokeless Tobacco Never  ? ? ?Goals Met:  ?Independence with exercise equipment ?Exercise tolerated well ?Personal goals reviewed ?No report of concerns or symptoms today ?Strength training completed today ? ?Goals Unmet:  ?Not Applicable ? ?Comments: Pt able to follow exercise prescription today without complaint.  Will continue to monitor for progression. ? ? ? ?Dr. Emily Filbert is Medical Director for [place].  ?Dr. Ottie Glazier is Medical Director for [facility] Pulmonary Rehabilitation. ?

## 2021-07-10 ENCOUNTER — Encounter: Payer: Medicare Other | Admitting: *Deleted

## 2021-07-10 ENCOUNTER — Other Ambulatory Visit: Payer: Self-pay

## 2021-07-10 DIAGNOSIS — I214 Non-ST elevation (NSTEMI) myocardial infarction: Secondary | ICD-10-CM | POA: Diagnosis not present

## 2021-07-10 NOTE — Progress Notes (Signed)
Daily Session Note ? ?Patient Details  ?Name: [name] ?MRN: [id number] ?Date of Birth: 03/27/1951 ?Referring Provider:   ?Flowsheet Row Cardiac Rehab from 06/09/2021 in [facility] Cardiac and Pulmonary Rehab  ?Referring Provider Gollan, Timothy MD  ? ?  ? ? ?Encounter Date: 07/10/2021 ? ?Check In: ? Session Check In - 07/10/21 0941   ? ?  ? Check-In  ? Supervising physician immediately available to respond to emergencies See telemetry face sheet for immediately available ER MD   ? Location [facility]-Cardiac & Pulmonary Rehab   ? Staff Present Meredith Craven, RN BSN;Joseph [place], RCP,RRT,BSRT;Jessica [place], MA, RCEP, CCRP, CCET   ? Virtual Visit No   ? Medication changes reported     No   ? Fall or balance concerns reported    No   ? Warm-up and Cool-down Performed on first and last piece of equipment   ? Resistance Training Performed Yes   ? VAD Patient? No   ? PAD/SET Patient? No   ?  ? Pain Assessment  ? Currently in Pain? No/denies   ? ?  ?  ? ?  ? ? ? ? ? ?Social History  ? ?Tobacco Use  ?Smoking Status Former  ? Packs/day: 3.00  ? Years: 25.00  ? Pack years: 75.00  ? Types: Cigarettes  ? Quit date: 05/28/1993  ? Years since quitting: 28.1  ? Passive exposure: Past  ?Smokeless Tobacco Never  ? ? ?Goals Met:  ?Independence with exercise equipment ?Exercise tolerated well ?No report of concerns or symptoms today ?Strength training completed today ? ?Goals Unmet:  ?Not Applicable ? ?Comments: Pt able to follow exercise prescription today without complaint.  Will continue to monitor for progression. ? ? ? ?Dr. Mark Miller is Medical Director for [facility] Cardiac Rehabilitation.  ?Dr. Fuad Aleskerov is Medical Director for [facility] Pulmonary Rehabilitation. ?

## 2021-07-13 ENCOUNTER — Encounter: Payer: Medicare Other | Admitting: *Deleted

## 2021-07-13 ENCOUNTER — Other Ambulatory Visit: Payer: Self-pay

## 2021-07-13 DIAGNOSIS — I214 Non-ST elevation (NSTEMI) myocardial infarction: Secondary | ICD-10-CM

## 2021-07-13 NOTE — Progress Notes (Signed)
Daily Session Note ? ?Patient Details  ?Name: COREN SAGAN ?MRN: [id number] ?Date of Birth: [birth year] ?Referring Provider:   ?Flowsheet Row Cardiac Rehab from 06/09/2021 in [facility] Cardiac and Pulmonary Rehab  ?Referring Provider [name]  ? ?  ? ? ?Encounter Date: 07/13/2021 ? ?Check In: ? Session Check In - 07/13/21 1040   ? ?  ? Check-In  ? Supervising physician immediately available to respond to emergencies See telemetry face sheet for immediately available ER MD   ? Location [facility]-Cardiac & Pulmonary Rehab   ? Staff Present Renita Papa, RN Moises Blood, BS, ACSM CEP, Exercise Physiologist;Jessica Luan Pulling, MA, RCEP, CCRP, CCET   ? Virtual Visit No   ? Medication changes reported     No   ? Fall or balance concerns reported    No   ? Warm-up and Cool-down Performed on first and last piece of equipment   ? Resistance Training Performed Yes   ? VAD Patient? No   ? PAD/SET Patient? No   ?  ? Pain Assessment  ? Currently in Pain? No/denies   ? ?  ?  ? ?  ? ? ? ? ? ?Social History  ? ?Tobacco Use  ?Smoking Status Former  ? Packs/day: 3.00  ? Years: 25.00  ? Pack years: 75.00  ? Types: Cigarettes  ? Quit date: 05/28/1993  ? Years since quitting: 28.1  ? Passive exposure: Past  ?Smokeless Tobacco Never  ? ? ?Goals Met:  ?Independence with exercise equipment ?Exercise tolerated well ?No report of concerns or symptoms today ?Strength training completed today ? ?Goals Unmet:  ?Not Applicable ? ?Comments: Pt able to follow exercise prescription today without complaint.  Will continue to monitor for progression. ? ? ? ?Dr. Emily Filbert is Medical Director for [place].  ?Dr. Ottie Glazier is Medical Director for [facility] Pulmonary Rehabilitation. ?

## 2021-07-15 ENCOUNTER — Other Ambulatory Visit: Payer: Self-pay

## 2021-07-15 ENCOUNTER — Encounter: Payer: Self-pay | Admitting: *Deleted

## 2021-07-15 DIAGNOSIS — I214 Non-ST elevation (NSTEMI) myocardial infarction: Secondary | ICD-10-CM

## 2021-07-15 NOTE — Progress Notes (Signed)
Daily Session Note ? ?Patient Details  ?Name: MARQUIZ SOTELO ?MRN: [id number] ?Date of Birth: 07-25-1950 ?Referring Provider:   ?Flowsheet Row Cardiac Rehab from 06/09/2021 in [facility] Cardiac and Pulmonary Rehab  ?Referring Provider [name]  ? ?  ? ? ?Encounter Date: 07/15/2021 ? ?Check In: ? Session Check In - 07/15/21 0951   ? ?  ? Check-In  ? Supervising physician immediately available to respond to emergencies See telemetry face sheet for immediately available ER MD   ? Location [facility]-Cardiac & Pulmonary Rehab   ? Staff Present Birdie Sons, MPA, RN;Joseph [place], RCP,RRT,BSRT;Melissa Sanford, RDN, LDN   ? Virtual Visit No   ? Medication changes reported     No   ? Fall or balance concerns reported    No   ? Warm-up and Cool-down Performed on first and last piece of equipment   ? Resistance Training Performed Yes   ? VAD Patient? No   ? PAD/SET Patient? No   ?  ? Pain Assessment  ? Currently in Pain? No/denies   ? ?  ?  ? ?  ? ? ? ? ? ?Social History  ? ?Tobacco Use  ?Smoking Status Former  ? Packs/day: 3.00  ? Years: 25.00  ? Pack years: 75.00  ? Types: Cigarettes  ? Quit date: 05/28/1993  ? Years since quitting: 28.1  ? Passive exposure: Past  ?Smokeless Tobacco Never  ? ? ?Goals Met:  ?Independence with exercise equipment ?Exercise tolerated well ?No report of concerns or symptoms today ?Strength training completed today ? ?Goals Unmet:  ?Not Applicable ? ?Comments: Pt able to follow exercise prescription today without complaint.  Will continue to monitor for progression. ? ? ? ?Dr. Emily Filbert is Medical Director for [place].  ?Dr. Ottie Glazier is Medical Director for [facility] Pulmonary Rehabilitation. ?

## 2021-07-15 NOTE — Progress Notes (Signed)
Cardiac Individual Treatment Plan ? ?Patient Details  ?Name: [name] ?MRN: [id number] ?Date of Birth: 1951-03-21 ?Referring Provider:   ?Flowsheet Row Cardiac Rehab from 06/09/2021 in [facility] Cardiac and Pulmonary Rehab  ?Referring Provider [name]  ? ?  ? ? ?Initial Encounter Date:  ?Flowsheet Row Cardiac Rehab from 06/09/2021 in [facility] Cardiac and Pulmonary Rehab  ?Date 06/09/21  ? ?  ? ? ?Visit Diagnosis: NSTEMI (non-ST elevated myocardial infarction) ([place]) ? ?Patient's Home Medications on Admission: ? ?Current Outpatient Medications:  ?  aspirin 81 MG tablet, Take 81 mg by mouth daily., Disp: , Rfl:  ?  BLACK ELDERBERRY PO, Take 1 each by mouth daily., Disp: , Rfl:  ?  carvedilol (COREG) 3.125 MG tablet, TAKE 1 TABLET (3.125 MG TOTAL) BY MOUTH 2 (TWO) TIMES DAILY., Disp: 180 tablet, Rfl: 3 ?  clopidogrel (PLAVIX) 75 MG tablet, TAKE 1 TABLET (75 MG TOTAL) BY MOUTH DAILY., Disp: 90 tablet, Rfl: 1 ?  ezetimibe (ZETIA) 10 MG tablet, TAKE 1 TABLET (10 MG TOTAL) BY MOUTH DAILY., Disp: 90 tablet, Rfl: 1 ?  gabapentin (NEURONTIN) 100 MG capsule, Take 100 mg by mouth 3 (three) times daily., Disp: , Rfl:  ?  isosorbide mononitrate (IMDUR) 30 MG 24 hr tablet, TAKE ONE a day, Disp: 90 tablet, Rfl: 3 ?  losartan (COZAAR) 25 MG tablet, TAKE 1/2 TABLET BY MOUTH EVERY DAY, Disp: 45 tablet, Rfl: 0 ?  mirtazapine (REMERON) 15 MG tablet, TAKE 1 TABLET BY MOUTH EVERYDAY AT BEDTIME, Disp: 90 tablet, Rfl: 3 ?  Multiple Vitamins-Minerals (MULTIVITAMIN WITH MINERALS) tablet, Take 1 tablet by mouth daily., Disp: , Rfl:  ?  nitroGLYCERIN (NITROSTAT) 0.4 MG SL tablet, PLACE 1 TABLET (0.4 MG TOTAL) UNDER THE TONGUE EVERY 5 (FIVE) MINUTES AS NEEDED FOR CHEST PAIN., Disp: 25 tablet, Rfl: 2 ?  pantoprazole (PROTONIX) 40 MG tablet, Take 1 tablet (40 mg total) by mouth 2 (two) times daily., Disp: 180 tablet, Rfl: 3 ?  ranolazine (RANEXA) 500 MG 12 hr tablet, Take 1 tablet (500 mg total) by mouth 2 (two) times daily., Disp: 180  tablet, Rfl: 3 ?  rosuvastatin (CRESTOR) 40 MG tablet, TAKE 1 TABLET BY MOUTH EVERY DAY, Disp: 90 tablet, Rfl: 3 ?  tiZANidine (ZANAFLEX) 4 MG tablet, Take 4 mg by mouth at bedtime., Disp: , Rfl:  ? ?Past Medical History: ?Past Medical History:  ?Diagnosis Date  ? AAA (abdominal aortic aneurysm)   ? a. Duplex 01/2012: stable infrarenal saccular AAA at 3.3cm x 3.2xm (f/u recommended 01/2013 per Dr. Rockey Situ)  ? Alcohol abuse   ? CAD (coronary artery disease)   ? a. 1995 s/p CABG x 4: LIMA->LAD, VG->RI (known to be occluded), VG->AM->PDA;  b. 05/2002 Inf STEMI: VG->AM->PDA 100% treated w/ 2 BMS complicated by acute thrombosis req 3 BMS;  c. 07/2003 DES to  LAD & LCX (VG's to PDA & RI 100%);  d. 12/2010 Acute MI ([place]): DES to LCX & LM , LIMA ok,;  e.12/2011 Cath: LM/LCX stents ok , LIMA patent.  ? Cardiomyopathy ([place])   ? a. EF 35% by cath 2015.  ? Diverticulitis   ? 1/06 Diverticulitis--CT of pelvis--diffuse sigmoid divertic  ? Dyspnea   ? GERD (gastroesophageal reflux disease)   ? Hyperlipidemia   ? Hypertension   ? Myocardial infarction [facility])   ? PAD (peripheral artery disease) ([place])   ? a. external iliac and mesenteric stenosis noted by noninvasive imaging.  ? Renal artery stenosis ([place])   ?  a. 03/2011 PTA and stenting of L RA. b. last duplex 2016 with stable 1-59% bilateral RAS, incidental >50% R EIA stenosis  ? ? ?Tobacco Use: ?Social History  ? ?Tobacco Use  ?Smoking Status Former  ? Packs/day: 3.00  ? Years: 25.00  ? Pack years: 75.00  ? Types: Cigarettes  ? Quit date: 05/28/1993  ? Years since quitting: 28.1  ? Passive exposure: Past  ?Smokeless Tobacco Never  ? ? ?Labs: ?Review Flowsheet   ? ?  ?  Latest Ref Rng & Units 10/29/2017 10/19/2018 02/20/2021 05/13/2021  ?Labs for [facility]P Cardiac and Pulmonary Rehab  ?Cholestrol 0 - 200 mg/dL 118   169   138     ?LDL (calc) 0 - 99 mg/dL 24       ?Direct LDL mg/dL  62.0   65.0     ?HDL-C >40 mg/dL 31   44.30   52.20     ?Trlycerides <150 mg/dL 316   492.0 Triglyceride is over 400;  calculations on Lipids are invalid.   206.0     ?Hemoglobin A1c 4.8 - 5.6 %      ?TCO2 22 - 32 mmol/L    25    ? ?  05/14/2021  ?Labs for [facility]P Cardiac and Pulmonary Rehab  ?Cholestrol 137    ?LDL (calc) 65    ?Direct LDL   ?HDL-C 48    ?Trlycerides 119    ?Hemoglobin A1c 6.8    ?TCO2   ?  ? ? Multiple values from one day are sorted in reverse-chronological order  ?  ?  ? ? ? ?Exercise Target Goals: ?Exercise Program Goal: ?Individual exercise prescription set using results from initial 6 min walk test and THRR while considering  patient?s activity barriers and safety.  ? ?Exercise Prescription Goal: ?Initial exercise prescription builds to 30-45 minutes a day of aerobic activity, 2-3 days per week.  Home exercise guidelines will be given to patient during program as part of exercise prescription that the participant will acknowledge. ? ? ?Education: Aerobic Exercise: ?- Group verbal and visual presentation on the components of exercise prescription. Introduces F.I.T.T principle from ACSM for exercise prescriptions.  Reviews F.I.T.T. principles of aerobic exercise including progression. Written material given at graduation. ?Flowsheet Row Cardiac Rehab from 07/08/2021 in [facility] Cardiac and Pulmonary Rehab  ?Date 06/24/21  ?Educator [facility]  ?Instruction Review Code 1- Verbalizes Understanding  ? ?  ? ? ?Education: Resistance Exercise: ?- Group verbal and visual presentation on the components of exercise prescription. Introduces F.I.T.T principle from ACSM for exercise prescriptions  Reviews F.I.T.T. principles of resistance exercise including progression. Written material given at graduation. ?Flowsheet Row Cardiac Rehab from 07/08/2021 in [facility] Cardiac and Pulmonary Rehab  ?Date 07/01/21  ?Educator AS  ?Instruction Review Code 1- Verbalizes Understanding  ? ?  ? ?  ?Education: Exercise & Equipment Safety: ?- Individual verbal instruction and demonstration of equipment use and safety with use of the equipment. ?Flowsheet Row  Cardiac Rehab from 05/28/2021 in [facility] Cardiac and Pulmonary Rehab  ?Date 05/28/21  ?Educator [facility]  ?Instruction Review Code 1- Verbalizes Understanding  ? ?  ? ? ?Education: Exercise Physiology & General Exercise Guidelines: ?- Group verbal and written instruction with models to review the exercise physiology of the cardiovascular system and associated critical values. Provides general exercise guidelines with specific guidelines to those with heart or lung disease.  ?Flowsheet Row Cardiac Rehab from 07/08/2021 in [facility] Cardiac and Pulmonary Rehab  ?Education need identified 06/09/21  ? ?  ? ? ?  Education: Flexibility, Balance, Mind/Body Relaxation: ?- Group verbal and visual presentation with interactive activity on the components of exercise prescription. Introduces F.I.T.T principle from ACSM for exercise prescriptions. Reviews F.I.T.T. principles of flexibility and balance exercise training including progression. Also discusses the mind body connection.  Reviews various relaxation techniques to help reduce and manage stress (i.e. Deep breathing, progressive muscle relaxation, and visualization). Balance handout provided to take home. Written material given at graduation. ? ? ?Activity Barriers & Risk Stratification: ? Activity Barriers & Cardiac Risk Stratification - 06/09/21 1056   ? ?  ? Activity Barriers & Cardiac Risk Stratification  ? Activity Barriers Shortness of Breath;Other (comment);Deconditioning   ? Comments Sciatica   ? Cardiac Risk Stratification High   ? ?  ?  ? ?  ? ? ?6 Minute Walk: ? 6 Minute Walk   ? ? [place] Name 06/09/21 1059  ?  ?  ?  ? 6 Minute Walk  ? Phase Initial    ? Distance 1055 feet    ? Walk Time 6 minutes    ? # of Rest Breaks 0    ? MPH 1.98    ? METS 2.42    ? RPE 13    ? Perceived Dyspnea  2    ? VO2 Peak 8.48    ? Symptoms Yes (comment)    ? Comments SOB    ? Resting HR 65 bpm    ? Resting BP 132/82    ? Resting Oxygen Saturation  97 %    ? Exercise Oxygen Saturation  during 6 min walk  98 %    ? Max Ex. HR 91 bpm    ? Max Ex. BP 152/82    ? 2 Minute Post BP 136/76    ? ?  ?  ? ?  ? ? ?Oxygen Initial Assessment: ? ? ?Oxygen Re-Evaluation: ? ? ?Oxygen Discharge (Final Oxygen Re-Evaluation): ? ?

## 2021-07-17 ENCOUNTER — Encounter: Payer: Medicare Other | Admitting: *Deleted

## 2021-07-17 ENCOUNTER — Other Ambulatory Visit: Payer: Self-pay

## 2021-07-17 DIAGNOSIS — I214 Non-ST elevation (NSTEMI) myocardial infarction: Secondary | ICD-10-CM

## 2021-07-17 NOTE — Progress Notes (Signed)
Daily Session Note ? ?Patient Details  ?Name: [name] ?MRN: [id number] ?Date of Birth: 08-Aug-1950 ?Referring Provider:   ?Flowsheet Row Cardiac Rehab from 06/09/2021 in [facility] Cardiac and Pulmonary Rehab  ?Referring Provider [name]  ? ?  ? ? ?Encounter Date: 07/17/2021 ? ?Check In: ? Session Check In - 07/17/21 0946   ? ?  ? Check-In  ? Supervising physician immediately available to respond to emergencies See telemetry face sheet for immediately available ER MD   ? Location [facility]-Cardiac & Pulmonary Rehab   ? Staff Present Renita Papa, RN BSN;Joseph [place], RCP,RRT,BSRT;Jessica [place], [place], [place], [place], CCET   ? Virtual Visit No   ? Medication changes reported     No   ? Fall or balance concerns reported    No   ? Warm-up and Cool-down Performed on first and last piece of equipment   ? Resistance Training Performed Yes   ? VAD Patient? No   ? PAD/SET Patient? No   ?  ? Pain Assessment  ? Currently in Pain? No/denies   ? ?  ?  ? ?  ? ? ? ? ? ?Social History  ? ?Tobacco Use  ?Smoking Status Former  ? Packs/day: 3.00  ? Years: 25.00  ? Pack years: 75.00  ? Types: Cigarettes  ? Quit date: 05/28/1993  ? Years since quitting: 28.1  ? Passive exposure: Past  ?Smokeless Tobacco Never  ? ? ?Goals Met:  ?Independence with exercise equipment ?Exercise tolerated well ?No report of concerns or symptoms today ?Strength training completed today ? ?Goals Unmet:  ?Not Applicable ? ?Comments: Pt able to follow exercise prescription today without complaint.  Will continue to monitor for progression. ? ? ? ?Dr. Emily Filbert is Medical Director for [place].  ?Dr. Ottie Glazier is Medical Director for [facility] Pulmonary Rehabilitation. ?

## 2021-07-20 ENCOUNTER — Encounter: Payer: Medicare Other | Admitting: *Deleted

## 2021-07-20 ENCOUNTER — Other Ambulatory Visit: Payer: Self-pay

## 2021-07-20 DIAGNOSIS — I214 Non-ST elevation (NSTEMI) myocardial infarction: Secondary | ICD-10-CM | POA: Diagnosis not present

## 2021-07-20 NOTE — Progress Notes (Signed)
Daily Session Note ? ?Patient Details  ?Name: BENJAMINE STROUT ?MRN: [id number] ?Date of Birth: June 17, 1950 ?Referring Provider:   ?Flowsheet Row Cardiac Rehab from 06/09/2021 in [facility] Cardiac and Pulmonary Rehab  ?Referring Provider [name]  ? ?  ? ? ?Encounter Date: 07/20/2021 ? ?Check In: ? Session Check In - 07/20/21 1024   ? ?  ? Check-In  ? Supervising physician immediately available to respond to emergencies See telemetry face sheet for immediately available ER MD   ? Location [facility]-Cardiac & Pulmonary Rehab   ? Staff Present Renita Papa, RN Moises Blood, BS, ACSM CEP, Exercise Physiologist;Amanda Oletta Darter, [place], ACSM CEP, Exercise Physiologist   ? Virtual Visit No   ? Medication changes reported     No   ? Fall or balance concerns reported    No   ? Warm-up and Cool-down Performed on first and last piece of equipment   ? Resistance Training Performed Yes   ? VAD Patient? No   ? PAD/SET Patient? No   ?  ? Pain Assessment  ? Currently in Pain? No/denies   ? ?  ?  ? ?  ? ? ? ? ? ?Social History  ? ?Tobacco Use  ?Smoking Status Former  ? Packs/day: 3.00  ? Years: 25.00  ? Pack years: 75.00  ? Types: Cigarettes  ? Quit date: 05/28/1993  ? Years since quitting: 28.1  ? Passive exposure: Past  ?Smokeless Tobacco Never  ? ? ?Goals Met:  ?Independence with exercise equipment ?Exercise tolerated well ?No report of concerns or symptoms today ?Strength training completed today ? ?Goals Unmet:  ?Not Applicable ? ?Comments: Pt able to follow exercise prescription today without complaint.  Will continue to monitor for progression. ? ? ? ?Dr. Emily Filbert is Medical Director for [place].  ?Dr. Ottie Glazier is Medical Director for [facility] Pulmonary Rehabilitation. ?

## 2021-07-22 ENCOUNTER — Other Ambulatory Visit: Payer: Self-pay

## 2021-07-22 DIAGNOSIS — I214 Non-ST elevation (NSTEMI) myocardial infarction: Secondary | ICD-10-CM

## 2021-07-22 NOTE — Progress Notes (Signed)
Daily Session Note ? ?Patient Details  ?Name: [name] ?MRN: [id number] ?Date of Birth: 1950-09-18 ?Referring Provider:   ?Flowsheet Row Cardiac Rehab from 06/09/2021 in [facility] Cardiac and Pulmonary Rehab  ?Referring Provider [name]  ? ?  ? ? ?Encounter Date: 07/22/2021 ? ?Check In: ? Session Check In - 07/22/21 0949   ? ?  ? Check-In  ? Supervising physician immediately available to respond to emergencies See telemetry face sheet for immediately available ER MD   ? Location [facility]-Cardiac & Pulmonary Rehab   ? Staff Present Birdie Sons, MPA, RN;Joseph [place], RCP,RRT,BSRT;Amanda Sommer, BA, ACSM CEP, Exercise Physiologist   ? Virtual Visit No   ? Medication changes reported     No   ? Fall or balance concerns reported    No   ? Warm-up and Cool-down Performed on first and last piece of equipment   ? Resistance Training Performed Yes   ? VAD Patient? No   ? PAD/SET Patient? No   ?  ? Pain Assessment  ? Currently in Pain? No/denies   ? ?  ?  ? ?  ? ? ? ? ? ?Social History  ? ?Tobacco Use  ?Smoking Status Former  ? Packs/day: 3.00  ? Years: 25.00  ? Pack years: 75.00  ? Types: Cigarettes  ? Quit date: 05/28/1993  ? Years since quitting: 28.1  ? Passive exposure: Past  ?Smokeless Tobacco Never  ? ? ?Goals Met:  ?Independence with exercise equipment ?Exercise tolerated well ?No report of concerns or symptoms today ?Strength training completed today ? ?Goals Unmet:  ?Not Applicable ? ?Comments: Pt able to follow exercise prescription today without complaint.  Will continue to monitor for progression. ? ? ? ?Dr. Emily Filbert is Medical Director for [place].  ?Dr. Ottie Glazier is Medical Director for [facility] Pulmonary Rehabilitation. ?

## 2021-07-24 ENCOUNTER — Encounter: Payer: Medicare Other | Admitting: *Deleted

## 2021-07-24 DIAGNOSIS — I214 Non-ST elevation (NSTEMI) myocardial infarction: Secondary | ICD-10-CM | POA: Diagnosis not present

## 2021-07-24 NOTE — Progress Notes (Signed)
Completed initial RD consultation ?

## 2021-07-24 NOTE — Progress Notes (Signed)
Daily Session Note ? ?Patient Details  ?Name: [name] ?MRN: [id number] ?Date of Birth: [birth year] ?Referring Provider:   ?Flowsheet Row Cardiac Rehab from 06/09/2021 in [facility] Cardiac and Pulmonary Rehab  ?Referring Provider [name]  ? ?  ? ? ?Encounter Date: 07/24/2021 ? ?Check In: ? Session Check In - 07/24/21 0946   ? ?  ? Check-In  ? Supervising physician immediately available to respond to emergencies See telemetry face sheet for immediately available ER MD   ? Location [facility]-Cardiac & Pulmonary Rehab   ? Staff Present Renita Papa, RN BSN;Joseph [place], RCP,RRT,BSRT;Melissa [place], [place], LDN   ? Virtual Visit No   ? Medication changes reported     No   ? Fall or balance concerns reported    No   ? Warm-up and Cool-down Performed on first and last piece of equipment   ? Resistance Training Performed Yes   ? VAD Patient? No   ? PAD/SET Patient? No   ?  ? Pain Assessment  ? Currently in Pain? No/denies   ? ?  ?  ? ?  ? ? ? ? ? ?Social History  ? ?Tobacco Use  ?Smoking Status Former  ? Packs/day: 3.00  ? Years: 25.00  ? Pack years: 75.00  ? Types: Cigarettes  ? Quit date: 05/28/1993  ? Years since quitting: 28.1  ? Passive exposure: Past  ?Smokeless Tobacco Never  ? ? ?Goals Met:  ?Independence with exercise equipment ?Exercise tolerated well ?No report of concerns or symptoms today ?Strength training completed today ? ?Goals Unmet:  ?Not Applicable ? ?Comments: Pt able to follow exercise prescription today without complaint.  Will continue to monitor for progression. ? ? ? ?Dr. Emily Filbert is Medical Director for [place].  ?Dr. Ottie Glazier is Medical Director for [facility] Pulmonary Rehabilitation. ?

## 2021-07-27 ENCOUNTER — Encounter: Payer: Medicare Other | Attending: Cardiovascular Disease | Admitting: *Deleted

## 2021-07-27 DIAGNOSIS — I252 Old myocardial infarction: Secondary | ICD-10-CM | POA: Insufficient documentation

## 2021-07-27 DIAGNOSIS — Z5189 Encounter for other specified aftercare: Secondary | ICD-10-CM | POA: Insufficient documentation

## 2021-07-27 DIAGNOSIS — I214 Non-ST elevation (NSTEMI) myocardial infarction: Secondary | ICD-10-CM

## 2021-07-27 NOTE — Progress Notes (Signed)
Daily Session Note ? ?Patient Details  ?Name: [name] ?MRN: [id number] ?Date of Birth: 04/01/1951 ?Referring Provider:   ?Flowsheet Row Cardiac Rehab from 06/09/2021 in [facility] Cardiac and Pulmonary Rehab  ?Referring Provider Gollan, Timothy MD  ? ?  ? ? ?Encounter Date: 07/27/2021 ? ?Check In: ? Session Check In - 07/27/21 1039   ? ?  ? Check-In  ? Supervising physician immediately available to respond to emergencies See telemetry face sheet for immediately available ER MD   ? Location [facility]Cardiac & Pulmonary Rehab   ? Staff Present Meredith Craven, RN BSN;Kelly Hayes, BS, ACSM CEP, Exercise Physiologist;Amanda Sommer, BA, ACSM CEP, Exercise Physiologist;Kelly Bollinger, MPA, RN   ? Virtual Visit No   ? Medication changes reported     No   ? Fall or balance concerns reported    No   ? Warm-up and Cool-down Performed on first and last piece of equipment   ? Resistance Training Performed Yes   ? VAD Patient? No   ? PAD/SET Patient? No   ?  ? Pain Assessment  ? Currently in Pain? No/denies   ? ?  ?  ? ?  ? ? ? ? ? ?Social History  ? ?Tobacco Use  ?Smoking Status Former  ? Packs/day: 3.00  ? Years: 25.00  ? Pack years: 75.00  ? Types: Cigarettes  ? Quit date: 05/28/1993  ? Years since quitting: 28.1  ? Passive exposure: Past  ?Smokeless Tobacco Never  ? ? ?Goals Met:  ?Independence with exercise equipment ?Exercise tolerated well ?No report of concerns or symptoms today ?Strength training completed today ? ?Goals Unmet:  ?Not Applicable ? ?Comments: Pt able to follow exercise prescription today without complaint.  Will continue to monitor for progression. ? ? ? ?Dr. Mark Miller is Medical Director for [facility] Cardiac Rehabilitation.  ?Dr. Fuad Aleskerov is Medical Director for [facility] Pulmonary Rehabilitation. ?

## 2021-07-29 DIAGNOSIS — I214 Non-ST elevation (NSTEMI) myocardial infarction: Secondary | ICD-10-CM

## 2021-07-29 DIAGNOSIS — Z5189 Encounter for other specified aftercare: Secondary | ICD-10-CM | POA: Diagnosis not present

## 2021-07-29 DIAGNOSIS — I252 Old myocardial infarction: Secondary | ICD-10-CM | POA: Diagnosis not present

## 2021-07-29 NOTE — Progress Notes (Signed)
Daily Session Note ? ?Patient Details  ?Name: [name] ?MRN: [id number] ?Date of Birth: 08/08/1950 ?Referring Provider:   ?Flowsheet Row Cardiac Rehab from 06/09/2021 in [facility] Cardiac and Pulmonary Rehab  ?Referring Provider [name]  ? ?  ? ? ?Encounter Date: 07/29/2021 ? ?Check In: ? Session Check In - 07/29/21 0944   ? ?  ? Check-In  ? Supervising physician immediately available to respond to emergencies See telemetry face sheet for immediately available ER MD   ? Location [facility]Cardiac & Pulmonary Rehab   ? Staff Present Birdie Sons, MPA, RN;Melissa [place], RDN, Tawanna Solo, MS, ASCM CEP, Exercise Physiologist;Joseph [place], [place]   ? Virtual Visit No   ? Medication changes reported     No   ? Fall or balance concerns reported    No   ? Warm-up and Cool-down Performed on first and last piece of equipment   ? Resistance Training Performed Yes   ? VAD Patient? No   ? PAD/SET Patient? No   ?  ? Pain Assessment  ? Currently in Pain? No/denies   ? ?  ?  ? ?  ? ? ? ? ? ?Social History  ? ?Tobacco Use  ?Smoking Status Former  ? Packs/day: 3.00  ? Years: 25.00  ? Pack years: 75.00  ? Types: Cigarettes  ? Quit date: 05/28/1993  ? Years since quitting: 28.1  ? Passive exposure: Past  ?Smokeless Tobacco Never  ? ? ?Goals Met:  ?Independence with exercise equipment ?Exercise tolerated well ?No report of concerns or symptoms today ?Strength training completed today ? ?Goals Unmet:  ?Not Applicable ? ?Comments: Pt able to follow exercise prescription today without complaint.  Will continue to monitor for progression. ? ? ? ?Dr. Emily Filbert is Medical Director for [place] [facility] [place].  ?Dr. Ottie Glazier is Medical Director for [facility] Pulmonary Rehabilitation. ?

## 2021-07-31 ENCOUNTER — Encounter: Payer: Medicare Other | Admitting: *Deleted

## 2021-07-31 DIAGNOSIS — Z5189 Encounter for other specified aftercare: Secondary | ICD-10-CM | POA: Diagnosis not present

## 2021-07-31 DIAGNOSIS — I252 Old myocardial infarction: Secondary | ICD-10-CM | POA: Diagnosis not present

## 2021-07-31 DIAGNOSIS — I214 Non-ST elevation (NSTEMI) myocardial infarction: Secondary | ICD-10-CM

## 2021-07-31 NOTE — Progress Notes (Signed)
Daily Session Note ? ?Patient Details  ?Name: [name] ?MRN: [id number] ?Date of Birth: [birth year] ?Referring Provider:   ?Flowsheet Row Cardiac Rehab from 06/09/2021 in [facility] Cardiac and Pulmonary Rehab  ?Referring Provider [name]  ? ?  ? ? ?Encounter Date: 07/31/2021 ? ?Check In: ? Session Check In - 07/31/21 0945   ? ?  ? Check-In  ? Supervising physician immediately available to respond to emergencies See telemetry face sheet for immediately available ER MD   ? Location [facility]Cardiac & Pulmonary Rehab   ? Staff Present Renita Papa, RN BSN;Joseph [place], RCP,RRT,BSRT;Laureen [place], [place], RRT, CPFT   ? Virtual Visit No   ? Medication changes reported     No   ? Fall or balance concerns reported    No   ? Warm-up and Cool-down Performed on first and last piece of equipment   ? Resistance Training Performed Yes   ? VAD Patient? No   ? PAD/SET Patient? No   ?  ? Pain Assessment  ? Currently in Pain? No/denies   ? ?  ?  ? ?  ? ? ? ? ? ?Social History  ? ?Tobacco Use  ?Smoking Status Former  ? Packs/day: 3.00  ? Years: 25.00  ? Pack years: 75.00  ? Types: Cigarettes  ? Quit date: 05/28/1993  ? Years since quitting: 28.1  ? Passive exposure: Past  ?Smokeless Tobacco Never  ? ? ?Goals Met:  ?Independence with exercise equipment ?Exercise tolerated well ?No report of concerns or symptoms today ?Strength training completed today ? ?Goals Unmet:  ?Not Applicable ? ?Comments: Pt able to follow exercise prescription today without complaint.  Will continue to monitor for progression. ? ? ? ?Dr. Emily Filbert is Medical Director for [place].  ?Dr. Ottie Glazier is Medical Director for [facility] Pulmonary Rehabilitation. ?

## 2021-08-03 ENCOUNTER — Encounter: Payer: Medicare Other | Admitting: *Deleted

## 2021-08-03 DIAGNOSIS — Z5189 Encounter for other specified aftercare: Secondary | ICD-10-CM | POA: Diagnosis not present

## 2021-08-03 DIAGNOSIS — I214 Non-ST elevation (NSTEMI) myocardial infarction: Secondary | ICD-10-CM

## 2021-08-03 DIAGNOSIS — I252 Old myocardial infarction: Secondary | ICD-10-CM | POA: Diagnosis not present

## 2021-08-03 NOTE — Progress Notes (Signed)
Daily Session Note ? ?Patient Details  ?Name: [name] ?MRN: [id number] ?Date of Birth: June 23, 1950 ?Referring Provider:   ?Flowsheet Row Cardiac Rehab from 06/09/2021 in [facility] Cardiac and Pulmonary Rehab  ?Referring Provider [name]  ? ?  ? ? ?Encounter Date: 08/03/2021 ? ?Check In: ? Session Check In - 08/03/21 1038   ? ?  ? Check-In  ? Supervising physician immediately available to respond to emergencies See telemetry face sheet for immediately available ER MD   ? Location [facility]Cardiac & Pulmonary Rehab   ? Staff Present Renita Papa, RN Moises Blood, BS, ACSM CEP, Exercise Physiologist;Laureen Owens Shark, BS, RRT, CPFT   ? Virtual Visit No   ? Medication changes reported     No   ? Fall or balance concerns reported    No   ? Warm-up and Cool-down Performed on first and last piece of equipment   ? Resistance Training Performed Yes   ? VAD Patient? No   ? PAD/SET Patient? No   ?  ? Pain Assessment  ? Currently in Pain? No/denies   ? ?  ?  ? ?  ? ? ? ? ? ?Social History  ? ?Tobacco Use  ?Smoking Status Former  ? Packs/day: 3.00  ? Years: 25.00  ? Pack years: 75.00  ? Types: Cigarettes  ? Quit date: 05/28/1993  ? Years since quitting: 28.2  ? Passive exposure: Past  ?Smokeless Tobacco Never  ? ? ?Goals Met:  ?Independence with exercise equipment ?Exercise tolerated well ?No report of concerns or symptoms today ?Strength training completed today ? ?Goals Unmet:  ?Not Applicable ? ?Comments: Pt able to follow exercise prescription today without complaint.  Will continue to monitor for progression. ? ? ? ?Dr. Emily Filbert is Medical Director for [place].  ?Dr. Ottie Glazier is Medical Director for [facility] Pulmonary Rehabilitation. ?

## 2021-08-05 ENCOUNTER — Encounter: Payer: Medicare Other | Admitting: *Deleted

## 2021-08-05 DIAGNOSIS — Z5189 Encounter for other specified aftercare: Secondary | ICD-10-CM | POA: Diagnosis not present

## 2021-08-05 DIAGNOSIS — I214 Non-ST elevation (NSTEMI) myocardial infarction: Secondary | ICD-10-CM

## 2021-08-05 DIAGNOSIS — I252 Old myocardial infarction: Secondary | ICD-10-CM | POA: Diagnosis not present

## 2021-08-05 NOTE — Progress Notes (Signed)
Daily Session Note ? ?Patient Details  ?Name: [name] ?MRN: [id number] ?Date of Birth: 05-25-1950 ?Referring Provider:   ?Flowsheet Row Cardiac Rehab from 06/09/2021 in [facility] Cardiac and Pulmonary Rehab  ?Referring Provider [name]  ? ?  ? ? ?Encounter Date: 08/05/2021 ? ?Check In: ? Session Check In - 08/05/21 1114   ? ?  ? Check-In  ? Supervising physician immediately available to respond to emergencies See telemetry face sheet for immediately available ER MD   ? Location [facility]-Cardiac & Pulmonary Rehab   ? Staff Present Renita Papa, RN BSN;Melissa [place], RDN, Rowe Pavy, BA, ACSM CEP, Exercise Physiologist   ? Virtual Visit No   ? Medication changes reported     No   ? Fall or balance concerns reported    No   ? Warm-up and Cool-down Performed on first and last piece of equipment   ? Resistance Training Performed Yes   ? VAD Patient? No   ? PAD/SET Patient? No   ?  ? Pain Assessment  ? Currently in Pain? No/denies   ? ?  ?  ? ?  ? ? ? ? ? ?Social History  ? ?Tobacco Use  ?Smoking Status Former  ? Packs/day: 3.00  ? Years: 25.00  ? Pack years: 75.00  ? Types: Cigarettes  ? Quit date: 05/28/1993  ? Years since quitting: 28.2  ? Passive exposure: Past  ?Smokeless Tobacco Never  ? ? ?Goals Met:  ?Independence with exercise equipment ?Exercise tolerated well ?No report of concerns or symptoms today ?Strength training completed today ? ?Goals Unmet:  ?Not Applicable ? ?Comments: Pt able to follow exercise prescription today without complaint.  Will continue to monitor for progression. ? ? ? ?Dr. Emily Filbert is Medical Director for [place].  ?Dr. Ottie Glazier is Medical Director for [facility] Pulmonary Rehabilitation. ?

## 2021-08-06 ENCOUNTER — Telehealth: Payer: Self-pay | Admitting: Cardiovascular Disease

## 2021-08-06 NOTE — Telephone Encounter (Signed)
Pt c/o BP issue: STAT if pt c/o blurred vision, one-sided weakness or slurred speech ? ?1. What are your last 5 BP readings?  ?4/11: 77/41 ?4:13: 121/82 ? ?2. Are you having any other symptoms (ex. Dizziness, headache, blurred vision, passed out)?  ?Dizziness when BP becomes low, but not currently ? ?3. What is your BP issue?  ? ?BP has been fluctuating. Patient states a few nights ago it got down to 77/41, but it has also recently been as high as the 150's/90's. BP is 121/82 currently. ? ?

## 2021-08-06 NOTE — Telephone Encounter (Signed)
Spoke with the patient. ?Pt sts that on 4/11 he was outside cutting wood. He did hydrate drinking 40-60 oz of water. ?Later that evening he began to feel dizzy and decided to check his BP. ?Pt reports 77/41. ? ?He participates in Cardiac Rehab. Yesterday 4/12 after exercise his BP 90/50.  ?When he returned home he rechecked his BP 136/62. ? ?This morning his BP was 121/82. He has not reported any HRs. ?He denies pre-syncope or syncope. ? ?Pt sts that the [employer] (strict vegan) was recommended to him by his pcp Dr. Silvio Pate after his MI. It has been 6 weeks and he has lost 16lbs. ? ?Advised the patient that I will fwd the msg to Dr. Rockey Situ to advise. ?Advised the patient to stay hydrated and to contact the office in the interim if assistance is needed. ?Patient agreeable with poc and voiced appreciation for the call. ? ? ? ?

## 2021-08-07 ENCOUNTER — Encounter: Payer: Medicare Other | Admitting: *Deleted

## 2021-08-07 DIAGNOSIS — I214 Non-ST elevation (NSTEMI) myocardial infarction: Secondary | ICD-10-CM

## 2021-08-07 DIAGNOSIS — I252 Old myocardial infarction: Secondary | ICD-10-CM | POA: Diagnosis not present

## 2021-08-07 DIAGNOSIS — Z5189 Encounter for other specified aftercare: Secondary | ICD-10-CM | POA: Diagnosis not present

## 2021-08-07 NOTE — Progress Notes (Signed)
Daily Session Note ? ?Patient Details  ?Name: [name] ?MRN: [id number] ?Date of Birth: 1950-07-22 ?Referring Provider:   ?Flowsheet Row Cardiac Rehab from 06/09/2021 in [facility] Cardiac and Pulmonary Rehab  ?Referring Provider [name]  ? ?  ? ? ?Encounter Date: 08/07/2021 ? ?Check In: ? Session Check In - 08/07/21 1031   ? ?  ? Check-In  ? Supervising physician immediately available to respond to emergencies See telemetry face sheet for immediately available ER MD   ? Location [facility]-Cardiac & Pulmonary Rehab   ? Staff Present Renita Papa, RN BSN;Joseph [place], RCP,RRT,BSRT;Melissa [place], [place], LDN   ? Virtual Visit No   ? Medication changes reported     No   ? Fall or balance concerns reported    No   ? Warm-up and Cool-down Performed on first and last piece of equipment   ? Resistance Training Performed Yes   ? VAD Patient? No   ? PAD/SET Patient? No   ?  ? Pain Assessment  ? Currently in Pain? No/denies   ? ?  ?  ? ?  ? ? ? ? ? ?Social History  ? ?Tobacco Use  ?Smoking Status Former  ? Packs/day: 3.00  ? Years: 25.00  ? Pack years: 75.00  ? Types: Cigarettes  ? Quit date: 05/28/1993  ? Years since quitting: 28.2  ? Passive exposure: Past  ?Smokeless Tobacco Never  ? ? ?Goals Met:  ?Independence with exercise equipment ?Exercise tolerated well ?No report of concerns or symptoms today ?Strength training completed today ? ?Goals Unmet:  ?Not Applicable ? ?Comments: Pt able to follow exercise prescription today without complaint.  Will continue to monitor for progression. ? ? ? ?Dr. Emily Filbert is Medical Director for [place].  ?Dr. Ottie Glazier is Medical Director for [facility] Pulmonary Rehabilitation. ?

## 2021-08-07 NOTE — Telephone Encounter (Signed)
Minna Merritts, MD  Mila Merry, RN ?Caller: Unspecified (Yesterday,  8:26 AM) ?Low blood pressure could be exacerbated by weight loss  ?If low blood pressure continues we may have to cut the isosorbide in half or stop the medication altogether  ?Other medications that can drop his blood pressure include losartan and Coreg  ?Would recommend he try to avoid further weight loss, stay hydrated  ?Thx  ?TGollan  ? ?Called and spoke with patient. Relayed MDs reccs to him. Pt reports that currently he only has lower BPs after exercising and they come up quickly. Was only symptomatic one time, after chopping wood. Told patient to keep close eye on BP and contact us if BP remains persistently low. Pt verbalized understanding and voiced appreciation for the call.  ?

## 2021-08-11 ENCOUNTER — Other Ambulatory Visit: Payer: Self-pay | Admitting: Internal Medicine

## 2021-08-12 ENCOUNTER — Encounter: Payer: Self-pay | Admitting: *Deleted

## 2021-08-12 DIAGNOSIS — I214 Non-ST elevation (NSTEMI) myocardial infarction: Secondary | ICD-10-CM

## 2021-08-12 DIAGNOSIS — I252 Old myocardial infarction: Secondary | ICD-10-CM | POA: Diagnosis not present

## 2021-08-12 DIAGNOSIS — Z5189 Encounter for other specified aftercare: Secondary | ICD-10-CM | POA: Diagnosis not present

## 2021-08-12 NOTE — Progress Notes (Signed)
Daily Session Note ? ?Patient Details  ?Name: [name] ?MRN: [id number] ?Date of Birth: 1950-07-18 ?Referring Provider:   ?Flowsheet Row Cardiac Rehab from 06/09/2021 in [facility] Cardiac and Pulmonary Rehab  ?Referring Provider [name]  ? ?  ? ? ?Encounter Date: 08/12/2021 ? ?Check In: ? Session Check In - 08/12/21 0956   ? ?  ? Check-In  ? Supervising physician immediately available to respond to emergencies See telemetry face sheet for immediately available ER MD   ? Location [facility]-Cardiac & Pulmonary Rehab   ? Staff Present Birdie Sons, MPA, RN;Joseph [place], RCP,RRT,BSRT;Amanda Sommer, BA, ACSM CEP, Exercise Physiologist   ? Virtual Visit No   ? Medication changes reported     No   ? Fall or balance concerns reported    No   ? Warm-up and Cool-down Performed on first and last piece of equipment   ? Resistance Training Performed Yes   ? VAD Patient? No   ? PAD/SET Patient? No   ?  ? Pain Assessment  ? Currently in Pain? No/denies   ? ?  ?  ? ?  ? ? ? ? ? ?Social History  ? ?Tobacco Use  ?Smoking Status Former  ? Packs/day: 3.00  ? Years: 25.00  ? Pack years: 75.00  ? Types: Cigarettes  ? Quit date: 05/28/1993  ? Years since quitting: 28.2  ? Passive exposure: Past  ?Smokeless Tobacco Never  ? ? ?Goals Met:  ?Independence with exercise equipment ?Exercise tolerated well ?No report of concerns or symptoms today ?Strength training completed today ? ?Goals Unmet:  ?Not Applicable ? ?Comments: Pt able to follow exercise prescription today without complaint.  Will continue to monitor for progression. ? ?Reviewed home exercise with pt today.  Pt plans to walk at home for exercise.  Reviewed THR, pulse, RPE, sign and symptoms, pulse oximetery and when to call 911 or MD.  Also discussed weather considerations and indoor options.  Pt voiced understanding. ? ? ?Dr. Emily Filbert is Medical Director for [place].  ?Dr. Ottie Glazier is Medical Director for [facility] Pulmonary  Rehabilitation. ?

## 2021-08-12 NOTE — Progress Notes (Signed)
Cardiac Individual Treatment Plan ? ?Patient Details  ?Name: [name] ?MRN: [id number] ?Date of Birth: 18-Feb-1951 ?Referring Provider:   ?Flowsheet Row Cardiac Rehab from 06/09/2021 in [facility] Cardiac and Pulmonary Rehab  ?Referring Provider [name]  ? ?  ? ? ?Initial Encounter Date:  ?Flowsheet Row Cardiac Rehab from 06/09/2021 in [facility] Cardiac and Pulmonary Rehab  ?Date 06/09/21  ? ?  ? ? ?Visit Diagnosis: NSTEMI (non-ST elevated myocardial infarction) ([place]) ? ?Patient's Home Medications on Admission: ? ?Current Outpatient Medications:  ?  aspirin 81 MG tablet, Take 81 mg by mouth daily., Disp: , Rfl:  ?  BLACK ELDERBERRY PO, Take 1 each by mouth daily., Disp: , Rfl:  ?  carvedilol (COREG) 3.125 MG tablet, TAKE 1 TABLET (3.125 MG TOTAL) BY MOUTH 2 (TWO) TIMES DAILY., Disp: 180 tablet, Rfl: 3 ?  clopidogrel (PLAVIX) 75 MG tablet, TAKE 1 TABLET (75 MG TOTAL) BY MOUTH DAILY., Disp: 90 tablet, Rfl: 1 ?  ezetimibe (ZETIA) 10 MG tablet, TAKE 1 TABLET (10 MG TOTAL) BY MOUTH DAILY., Disp: 90 tablet, Rfl: 1 ?  gabapentin (NEURONTIN) 100 MG capsule, Take 100 mg by mouth 3 (three) times daily., Disp: , Rfl:  ?  isosorbide mononitrate (IMDUR) 30 MG 24 hr tablet, TAKE ONE a day, Disp: 90 tablet, Rfl: 3 ?  losartan (COZAAR) 25 MG tablet, TAKE 1/2 TABLET BY MOUTH EVERY DAY, Disp: 45 tablet, Rfl: 0 ?  mirtazapine (REMERON) 15 MG tablet, TAKE 1 TABLET BY MOUTH EVERYDAY AT BEDTIME, Disp: 90 tablet, Rfl: 3 ?  Multiple Vitamins-Minerals (MULTIVITAMIN WITH MINERALS) tablet, Take 1 tablet by mouth daily., Disp: , Rfl:  ?  nitroGLYCERIN (NITROSTAT) 0.4 MG SL tablet, PLACE 1 TABLET (0.4 MG TOTAL) UNDER THE TONGUE EVERY 5 (FIVE) MINUTES AS NEEDED FOR CHEST PAIN., Disp: 25 tablet, Rfl: 2 ?  pantoprazole (PROTONIX) 40 MG tablet, Take 1 tablet (40 mg total) by mouth 2 (two) times daily., Disp: 180 tablet, Rfl: 3 ?  ranolazine (RANEXA) 500 MG 12 hr tablet, Take 1 tablet (500 mg total) by mouth 2 (two) times daily., Disp: 180  tablet, Rfl: 3 ?  rosuvastatin (CRESTOR) 40 MG tablet, TAKE 1 TABLET BY MOUTH EVERY DAY, Disp: 90 tablet, Rfl: 3 ?  tiZANidine (ZANAFLEX) 4 MG tablet, Take 4 mg by mouth at bedtime., Disp: , Rfl:  ? ?Past Medical History: ?Past Medical History:  ?Diagnosis Date  ? AAA (abdominal aortic aneurysm)   ? a. Duplex 01/2012: stable infrarenal saccular AAA at 3.3cm x 3.2xm (f/u recommended 01/2013 per Dr. Rockey Situ)  ? Alcohol abuse   ? CAD (coronary artery disease)   ? a. 1995 s/p CABG x 4: LIMA->LAD, VG->RI (known to be occluded), VG->AM->PDA;  b. 05/2002 Inf STEMI: VG->AM->PDA 100% treated w/ 2 BMS complicated by acute thrombosis req 3 BMS;  c. 07/2003 DES to  LAD & LCX (VG's to PDA & RI 100%);  d. 12/2010 Acute MI ([place]): DES to LCX & LM , LIMA ok,;  e.12/2011 Cath: LM/LCX stents ok , LIMA patent.  ? Cardiomyopathy ([place])   ? a. EF 35% by cath 2015.  ? Diverticulitis   ? 1/06 Diverticulitis--CT of pelvis--diffuse sigmoid divertic  ? Dyspnea   ? GERD (gastroesophageal reflux disease)   ? Hyperlipidemia   ? Hypertension   ? Myocardial infarction [facility])   ? PAD (peripheral artery disease) ([place])   ? a. external iliac and mesenteric stenosis noted by noninvasive imaging.  ? Renal artery stenosis ([place])   ?  a. 03/2011 PTA and stenting of L RA. b. last duplex 2016 with stable 1-59% bilateral RAS, incidental >50% R EIA stenosis  ? ? ?Tobacco Use: ?Social History  ? ?Tobacco Use  ?Smoking Status Former  ? Packs/day: 3.00  ? Years: 25.00  ? Pack years: 75.00  ? Types: Cigarettes  ? Quit date: 05/28/1993  ? Years since quitting: 28.2  ? Passive exposure: Past  ?Smokeless Tobacco Never  ? ? ?Labs: ?Review Flowsheet   ? ?  ?  Latest Ref Rng & Units 10/29/2017 10/19/2018 02/20/2021 05/13/2021  ?Labs for ITP Cardiac and Pulmonary Rehab  ?Cholestrol 0 - 200 mg/dL 118   169   138     ?LDL (calc) 0 - 99 mg/dL 24       ?Direct LDL mg/dL  62.0   65.0     ?HDL-C >40 mg/dL 31   44.30   52.20     ?Trlycerides <150 mg/dL 316   492.0 Triglyceride is over 400;  calculations on Lipids are invalid.   206.0     ?Hemoglobin A1c 4.8 - 5.6 %      ?TCO2 22 - 32 mmol/L    25    ? ?  05/14/2021  ?Labs for ITP Cardiac and Pulmonary Rehab  ?Cholestrol 137    ?LDL (calc) 65    ?Direct LDL   ?HDL-C 48    ?Trlycerides 119    ?Hemoglobin A1c 6.8    ?TCO2   ?  ? ? Multiple values from one day are sorted in reverse-chronological order  ?  ?  ? ? ? ?Exercise Target Goals: ?Exercise Program Goal: ?Individual exercise prescription set using results from initial 6 min walk test and THRR while considering  patient?s activity barriers and safety.  ? ?Exercise Prescription Goal: ?Initial exercise prescription builds to 30-45 minutes a day of aerobic activity, 2-3 days per week.  Home exercise guidelines will be given to patient during program as part of exercise prescription that the participant will acknowledge. ? ? ?Education: Aerobic Exercise: ?- Group verbal and visual presentation on the components of exercise prescription. Introduces F.I.T.T principle from ACSM for exercise prescriptions.  Reviews F.I.T.T. principles of aerobic exercise including progression. Written material given at graduation. ?Flowsheet Row Cardiac Rehab from 08/12/2021 in [facility] Cardiac and Pulmonary Rehab  ?Date 06/24/21  ?Educator [facility]  ?Instruction Review Code 1- Verbalizes Understanding  ? ?  ? ? ?Education: Resistance Exercise: ?- Group verbal and visual presentation on the components of exercise prescription. Introduces F.I.T.T principle from ACSM for exercise prescriptions  Reviews F.I.T.T. principles of resistance exercise including progression. Written material given at graduation. ?Flowsheet Row Cardiac Rehab from 08/12/2021 in [facility] Cardiac and Pulmonary Rehab  ?Date 07/01/21  ?Educator AS  ?Instruction Review Code 1- Verbalizes Understanding  ? ?  ? ?  ?Education: Exercise & Equipment Safety: ?- Individual verbal instruction and demonstration of equipment use and safety with use of the equipment. ?Flowsheet Row  Cardiac Rehab from 05/28/2021 in [facility] Cardiac and Pulmonary Rehab  ?Date 05/28/21  ?Educator [facility]  ?Instruction Review Code 1- Verbalizes Understanding  ? ?  ? ? ?Education: Exercise Physiology & General Exercise Guidelines: ?- Group verbal and written instruction with models to review the exercise physiology of the cardiovascular system and associated critical values. Provides general exercise guidelines with specific guidelines to those with heart or lung disease.  ?Flowsheet Row Cardiac Rehab from 08/12/2021 in [facility] Cardiac and Pulmonary Rehab  ?Education need identified 06/09/21  ? ?  ? ? ?  Education: Flexibility, Balance, Mind/Body Relaxation: ?- Group verbal and visual presentation with interactive activity on the components of exercise prescription. Introduces F.I.T.T principle from ACSM for exercise prescriptions. Reviews F.I.T.T. principles of flexibility and balance exercise training including progression. Also discusses the mind body connection.  Reviews various relaxation techniques to help reduce and manage stress (i.e. Deep breathing, progressive muscle relaxation, and visualization). Balance handout provided to take home. Written material given at graduation. ?Flowsheet Row Cardiac Rehab from 08/12/2021 in [facility] Cardiac and Pulmonary Rehab  ?Date 07/15/21  ?Educator AS  ?Instruction Review Code 1- Verbalizes Understanding  ? ?  ? ? ?Activity Barriers & Risk Stratification: ? Activity Barriers & Cardiac Risk Stratification - 06/09/21 1056   ? ?  ? Activity Barriers & Cardiac Risk Stratification  ? Activity Barriers Shortness of Breath;Other (comment);Deconditioning   ? Comments Sciatica   ? Cardiac Risk Stratification High   ? ?  ?  ? ?  ? ? ?6 Minute Walk: ? 6 Minute Walk   ? ? [place] Name 06/09/21 1059  ?  ?  ?  ? 6 Minute Walk  ? Phase Initial    ? Distance 1055 feet    ? Walk Time 6 minutes    ? # of Rest Breaks 0    ? MPH 1.98    ? METS 2.42    ? RPE 13    ? Perceived Dyspnea  2    ? VO2 Peak 8.48    ?  Symptoms Yes (comment)    ? Comments SOB    ? Resting HR 65 bpm    ? Resting BP 132/82    ? Resting Oxygen Saturation  97 %    ? Exercise Oxygen Saturation  during 6 min walk 98 %    ? Max Ex. HR 91 bpm    ? Max Ex.

## 2021-08-13 NOTE — Telephone Encounter (Signed)
Is this okay to refill? 

## 2021-08-14 ENCOUNTER — Encounter: Payer: Medicare Other | Admitting: *Deleted

## 2021-08-14 DIAGNOSIS — I214 Non-ST elevation (NSTEMI) myocardial infarction: Secondary | ICD-10-CM

## 2021-08-14 DIAGNOSIS — I252 Old myocardial infarction: Secondary | ICD-10-CM | POA: Diagnosis not present

## 2021-08-14 DIAGNOSIS — Z5189 Encounter for other specified aftercare: Secondary | ICD-10-CM | POA: Diagnosis not present

## 2021-08-14 NOTE — Progress Notes (Signed)
Daily Session Note ? ?Patient Details  ?Name: [name] ?MRN: [id number] ?Date of Birth: Mar 06, 1951 ?Referring Provider:   ?Flowsheet Row Cardiac Rehab from 06/09/2021 in [facility] Cardiac and Pulmonary Rehab  ?Referring Provider [name]  ? ?  ? ? ?Encounter Date: 08/14/2021 ? ?Check In: ? Session Check In - 08/14/21 0945   ? ?  ? Check-In  ? Supervising physician immediately available to respond to emergencies See telemetry face sheet for immediately available ER MD   ? Location [facility]-Cardiac & Pulmonary Rehab   ? Staff Present Renita Papa, RN BSN;Joseph Thomas, RCP,RRT,BSRT;Jessica [place], [place], [place], [place], CCET   ? Virtual Visit No   ? Medication changes reported     No   ? Fall or balance concerns reported    No   ? Warm-up and Cool-down Performed on first and last piece of equipment   ? Resistance Training Performed Yes   ? VAD Patient? No   ? PAD/SET Patient? No   ?  ? Pain Assessment  ? Currently in Pain? No/denies   ? ?  ?  ? ?  ? ? ? ? ? ?Social History  ? ?Tobacco Use  ?Smoking Status Former  ? Packs/day: 3.00  ? Years: 25.00  ? Pack years: 75.00  ? Types: Cigarettes  ? Quit date: 05/28/1993  ? Years since quitting: 28.2  ? Passive exposure: Past  ?Smokeless Tobacco Never  ? ? ?Goals Met:  ?Independence with exercise equipment ?Exercise tolerated well ?No report of concerns or symptoms today ?Strength training completed today ? ?Goals Unmet:  ?Not Applicable ? ?Comments: Pt able to follow exercise prescription today without complaint.  Will continue to monitor for progression. ? ? ? ?Dr. Emily Filbert is Medical Director for [place].  ?Dr. Ottie Glazier is Medical Director for [facility] Pulmonary Rehabilitation. ?

## 2021-08-17 ENCOUNTER — Encounter: Payer: Medicare Other | Admitting: *Deleted

## 2021-08-17 DIAGNOSIS — I214 Non-ST elevation (NSTEMI) myocardial infarction: Secondary | ICD-10-CM

## 2021-08-17 DIAGNOSIS — I252 Old myocardial infarction: Secondary | ICD-10-CM | POA: Diagnosis not present

## 2021-08-17 DIAGNOSIS — Z5189 Encounter for other specified aftercare: Secondary | ICD-10-CM | POA: Diagnosis not present

## 2021-08-17 NOTE — Progress Notes (Signed)
Daily Session Note ? ?Patient Details  ?Name: [name] ?MRN: [id number] ?Date of Birth: 02/14/1951 ?Referring Provider:   ?Flowsheet Row Cardiac Rehab from 06/09/2021 in [facility] Cardiac and Pulmonary Rehab  ?Referring Provider [name]  ? ?  ? ? ?Encounter Date: 08/17/2021 ? ?Check In: ? Session Check In - 08/17/21 1022   ? ?  ? Check-In  ? Supervising physician immediately available to respond to emergencies See telemetry face sheet for immediately available ER MD   ? Location [facility]-Cardiac & Pulmonary Rehab   ? Staff Present Renita Papa, RN Moises Blood, BS, ACSM CEP, Exercise Physiologist;Kara Eliezer Bottom, MS, ASCM CEP, Exercise Physiologist   ? Virtual Visit No   ? Medication changes reported     No   ? Fall or balance concerns reported    No   ? Warm-up and Cool-down Performed on first and last piece of equipment   ? Resistance Training Performed Yes   ? VAD Patient? No   ? PAD/SET Patient? No   ?  ? Pain Assessment  ? Currently in Pain? No/denies   ? ?  ?  ? ?  ? ? ? ? ? ?Social History  ? ?Tobacco Use  ?Smoking Status Former  ? Packs/day: 3.00  ? Years: 25.00  ? Pack years: 75.00  ? Types: Cigarettes  ? Quit date: 05/28/1993  ? Years since quitting: 28.2  ? Passive exposure: Past  ?Smokeless Tobacco Never  ? ? ?Goals Met:  ?Independence with exercise equipment ?Exercise tolerated well ?No report of concerns or symptoms today ?Strength training completed today ? ?Goals Unmet:  ?Not Applicable ? ?Comments: Pt able to follow exercise prescription today without complaint.  Will continue to monitor for progression. ? ? ? ?Dr. Emily Filbert is Medical Director for [place].  ?Dr. Ottie Glazier is Medical Director for [facility] Pulmonary Rehabilitation. ?

## 2021-08-19 VITALS — Ht 64.75 in | Wt 157.8 lb

## 2021-08-19 DIAGNOSIS — I252 Old myocardial infarction: Secondary | ICD-10-CM | POA: Diagnosis not present

## 2021-08-19 DIAGNOSIS — Z5189 Encounter for other specified aftercare: Secondary | ICD-10-CM | POA: Diagnosis not present

## 2021-08-19 DIAGNOSIS — I214 Non-ST elevation (NSTEMI) myocardial infarction: Secondary | ICD-10-CM

## 2021-08-19 NOTE — Progress Notes (Signed)
Daily Session Note ? ?Patient Details  ?Name: [name] ?MRN: [id number] ?Date of Birth: February 05, 1951 ?Referring Provider:   ?Flowsheet Row Cardiac Rehab from 06/09/2021 in [facility] Cardiac and Pulmonary Rehab  ?Referring Provider [name]  ? ?  ? ? ?Encounter Date: 08/19/2021 ? ?Check In: ? Session Check In - 08/19/21 0954   ? ?  ? Check-In  ? Supervising physician immediately available to respond to emergencies See telemetry face sheet for immediately available ER MD   ? Location [facility]-Cardiac & Pulmonary Rehab   ? Staff Present Birdie Sons, MPA, RN;Melissa Ardmore, RDN, Tawanna Solo, MS, ASCM CEP, Exercise Physiologist;Joseph [place], [place]   ? Virtual Visit No   ? Medication changes reported     No   ? Fall or balance concerns reported    No   ? Warm-up and Cool-down Performed on first and last piece of equipment   ? Resistance Training Performed Yes   ? VAD Patient? No   ? PAD/SET Patient? No   ?  ? Pain Assessment  ? Currently in Pain? No/denies   ? ?  ?  ? ?  ? ? ? ? ? ?Social History  ? ?Tobacco Use  ?Smoking Status Former  ? Packs/day: 3.00  ? Years: 25.00  ? Pack years: 75.00  ? Types: Cigarettes  ? Quit date: 05/28/1993  ? Years since quitting: 28.2  ? Passive exposure: Past  ?Smokeless Tobacco Never  ? ? ?Goals Met:  ?Independence with exercise equipment ?Exercise tolerated well ?No report of concerns or symptoms today ?Strength training completed today ? ?Goals Unmet:  ?Not Applicable ? ?Comments: Pt able to follow exercise prescription today without complaint.  Will continue to monitor for progression. ? ? 6 Minute Walk   ? ? [place] Name 06/09/21 1059 08/19/21 1008  ?  ?  ? 6 Minute Walk  ? Phase Initial Discharge   ? Distance 1055 feet 1380 feet   ? Distance % Change -- 30.8 %   ? Distance Feet Change -- 325 ft   ? Walk Time 6 minutes 6 minutes   ? # of Rest Breaks 0 0   ? MPH 1.98 2.61   ? METS 2.42 3.08   ? RPE 13 12   ? Perceived Dyspnea  2 --   ? VO2 Peak 8.48 10.76   ? Symptoms Yes  (comment) No   ? Comments SOB --   ? Resting HR 65 bpm 58 bpm   ? Resting BP 132/82 126/64   ? Resting Oxygen Saturation  97 % 93 %   ? Exercise Oxygen Saturation  during 6 min walk 98 % 95 %   ? Max Ex. HR 91 bpm 91 bpm   ? Max Ex. BP 152/82 146/74   ? 2 Minute Post BP 136/76 --   ? ?  ?  ? ?  ? ? ? ?Dr. Emily Filbert is Medical Director for [place].  ?Dr. Ottie Glazier is Medical Director for [facility] Pulmonary Rehabilitation. ?

## 2021-08-20 ENCOUNTER — Encounter: Payer: Self-pay | Admitting: Internal Medicine

## 2021-08-20 NOTE — Patient Instructions (Signed)
Discharge Patient Instructions ? ?Patient Details  ?Name: [name] ?MRN: [id number] ?Date of Birth: 05/29/1950 ?Referring Provider:  Minna Merritts, MD ? ? ?Number of Visits: 84 ? ?Reason for Discharge:  ?Patient reached a stable level of exercise. ?Patient independent in their exercise. ?Patient has met program and personal goals. ? ?Smoking History:  ?Social History  ? ?Tobacco Use  ?Smoking Status Former  ? Packs/day: 3.00  ? Years: 25.00  ? Pack years: 75.00  ? Types: Cigarettes  ? Quit date: 05/28/1993  ? Years since quitting: 28.2  ? Passive exposure: Past  ?Smokeless Tobacco Never  ? ? ?Diagnosis:  ?NSTEMI (non-ST elevated myocardial infarction) ([place]) ? ?Initial Exercise Prescription: ? Initial Exercise Prescription - 06/09/21 1000   ? ?  ? Date of Initial Exercise RX and Referring Provider  ? Date 06/09/21   ? Referring Provider [name]   ?  ? Oxygen  ? Maintain Oxygen Saturation 88% or higher   ?  ? Treadmill  ? MPH 1.7   ? Grade 0   ? Minutes 15   ? METs 2.3   ?  ? Recumbant Bike  ? Level 1   ? RPM 60   ? Minutes 15   ? METs 2.4   ?  ? NuStep  ? Level 1   ? SPM 80   ? Minutes 15   ? METs 2.4   ?  ? Track  ? Laps 23   ? Minutes 15   ? METs 2.25   ?  ? Prescription Details  ? Frequency (times per week) 3   ? Duration Progress to 30 minutes of continuous aerobic without signs/symptoms of physical distress   ?  ? Intensity  ? THRR 40-80% of Max Heartrate 99 - 133   ? Ratings of Perceived Exertion 11-13   ? Perceived Dyspnea 0-4   ?  ? Progression  ? Progression Continue to progress workloads to maintain intensity without signs/symptoms of physical distress.   ?  ? Resistance Training  ? Training Prescription Yes   ? Weight 4 lb   ? Reps 10-15   ? ?  ?  ? ?  ? ? ?Discharge Exercise Prescription (Final Exercise Prescription Changes): ? Exercise Prescription Changes - 08/18/21 1600   ? ?  ? Response to Exercise  ? Blood Pressure (Admit) 110/62   ? Blood Pressure (Exit) 110/64   ? Heart Rate  (Admit) 54 bpm   ? Heart Rate (Exercise) 88 bpm   ? Heart Rate (Exit) 69 bpm   ? Oxygen Saturation (Admit) 97 %   ? Oxygen Saturation (Exercise) 91 %   ? Oxygen Saturation (Exit) 96 %   ? Rating of Perceived Exertion (Exercise) 13   ? Symptoms none   ? Duration Continue with 30 min of aerobic exercise without signs/symptoms of physical distress.   ? Intensity THRR unchanged   ?  ? Progression  ? Progression Continue to progress workloads to maintain intensity without signs/symptoms of physical distress.   ? Average METs 2.69   ?  ? Resistance Training  ? Training Prescription Yes   ? Weight 4 lb   ? Reps 10-15   ?  ? Interval Training  ? Interval Training No   ?  ? Treadmill  ? MPH 2.3   ? Grade 0   ? Minutes 15   ? METs 2.76   ?  ? Recumbant Bike  ? Level 4   ?  Minutes 15   ? METs 3.1   ?  ? NuStep  ? Level 4   ? Minutes 15   ? METs 2.9   ?  ? T5 Nustep  ? Level 2   ? Minutes 15   ? METs 2   ?  ? Home Exercise Plan  ? Plans to continue exercise at Home (comment)   walking  ? Frequency Add 2 additional days to program exercise sessions.   ? Initial Home Exercises Provided 08/12/21   ?  ? Oxygen  ? Maintain Oxygen Saturation 88% or higher   ? ?  ?  ? ?  ? ? ?Functional Capacity: ? 6 Minute Walk   ? ? [place] Name 06/09/21 1059 08/19/21 1008  ?  ?  ? 6 Minute Walk  ? Phase Initial Discharge   ? Distance 1055 feet 1380 feet   ? Distance % Change -- 30.8 %   ? Distance Feet Change -- 325 ft   ? Walk Time 6 minutes 6 minutes   ? # of Rest Breaks 0 0   ? MPH 1.98 2.61   ? METS 2.42 3.08   ? RPE 13 12   ? Perceived Dyspnea  2 --   ? VO2 Peak 8.48 10.76   ? Symptoms Yes (comment) No   ? Comments SOB --   ? Resting HR 65 bpm 58 bpm   ? Resting BP 132/82 126/64   ? Resting Oxygen Saturation  97 % 93 %   ? Exercise Oxygen Saturation  during 6 min walk 98 % 95 %   ? Max Ex. HR 91 bpm 91 bpm   ? Max Ex. BP 152/82 146/74   ? 2 Minute Post BP 136/76 --   ? ?  ?  ? ?  ? ? ? ?Nutrition & Weight - Outcomes: ? Pre Biometrics - 06/09/21  1055   ? ?  ? Pre Biometrics  ? Height 5' 4.75" (1.645 m)   ? Weight 170 lb 8 oz (77.3 kg)   ? BMI (Calculated) 28.58   ? Single Leg Stand 3.7 seconds   ? ?  ?  ? ?  ? ? Post Biometrics - 08/19/21 1009   ? ?  ?  Post  Biometrics  ? Height 5' 4.75" (1.645 m)   ? Weight 157 lb 12.8 oz (71.6 kg)   ? BMI (Calculated) 26.45   ? Single Leg Stand 1.5 seconds   ? ?  ?  ? ?  ? ? ?Nutrition: ? Nutrition Therapy & Goals - 07/24/21 1218   ? ?  ? Nutrition Therapy  ? Diet Heart healthy, low Na - currently following [place] diet   ? Drug/Food Interactions Statins/Certain Fruits   ? Protein (specify units) 60g   ? Fiber 30 grams   ? Whole Grain Foods 3 servings   ? Saturated Fats 16 max. grams   [place] diet limits all fat <4g/day  ? Fruits and Vegetables 8 servings/day   ? Sodium 2 grams   ?  ? Personal Nutrition Goals  ? Nutrition Goal ST: eat at least the amount of fat allowed by the [place] diet - ground flaxseed in oatmeal or cream of wheat, consider supplementing with B-12 LT: Maintain healthy eating plan that works for him   ? Comments 71 y.o. M admitted to cardiac rehab s/p NSTEMI. PMHx includes HTN, CAD, AA, HF, COPD, GERD, IBS, HLD. Relevant medications includes black elderberry, MVI, protonix, crestor.  Last A1C 6.8.  PYP Score:  73. Vegetables & Fruits 8/12. Breads, Grains & Cereals 10/12. Red & Processed Meat 11/12. Poultry 2/2. Fish & Shellfish 3/4. Beans, Nuts & Seeds 0/4. Milk & Dairy Foods 1/6. Toppings, Oils, Seasonings & Salt 16/20. Sweets, Snacks & Restaurant Food 12/14. Beverages 10/10.  Elige is currently on the [employer] which is a vegetarian diet that emphasizes whole plant foods and limits process foods and refined CHOS, alcohol, and dairy. The [employer] also greatly limits even healthy fats from nuts/seeds, olives, and avocados; the diet does allow for at most 4g of fat and the diet provided serving sizes for that like 1 walnut - discussed that we need fat as a macronutrient for normal body  functioning and to help absorb fat soluble nutrient and encouraged to have at least the fat allowed in the diet like adding ground flaxseed to breakfast. B: oatmeal or cream of wheat of wheat with coffee L: fruit or beans with tomatoes, spinach, rice D: salad (balsalmic vinegar, cucumbers, mushrooms, tomatoes, iceburg lettuce) with corn and fruit. Discussed the importance of a b-12 supplement as he now does not have a reliable source in his diet and to check with MD before starting. Discussed importance of variety of vegetables and reviewed general heart healthy eating. Venkat and his wife were asking about salmon as well as other things that are not within the [place] - discussed how this diet can be very strict and hard for people to stick to long term - told them that if in the future they wanted to become more flexible, but keep the basics of the diet to let me know and we can review and come up with a good balnce for their needs/wants.   ?  ? Intervention Plan  ? Intervention Prescribe, educate and counsel regarding individualized specific dietary modifications aiming towards targeted core components such as weight, hypertension, lipid management, diabetes, heart failure and other comorbidities.   ? Expected Outcomes Short Term Goal: Understand basic principles of dietary content, such as calories, fat, sodium, cholesterol and nutrients.;Short Term Goal: A plan has been developed with personal nutrition goals set during dietitian appointment.;Long Term Goal: Adherence to prescribed nutrition plan.   ? ?  ?  ? ?  ? ? ? ?Goals reviewed with patient; copy given to patient. ?

## 2021-08-21 ENCOUNTER — Encounter: Payer: Medicare Other | Admitting: *Deleted

## 2021-08-21 DIAGNOSIS — I214 Non-ST elevation (NSTEMI) myocardial infarction: Secondary | ICD-10-CM

## 2021-08-21 DIAGNOSIS — Z5189 Encounter for other specified aftercare: Secondary | ICD-10-CM | POA: Diagnosis not present

## 2021-08-21 DIAGNOSIS — I252 Old myocardial infarction: Secondary | ICD-10-CM | POA: Diagnosis not present

## 2021-08-21 NOTE — Progress Notes (Signed)
Daily Session Note ? ?Patient Details  ?Name: [name] ?MRN: [id number] ?Date of Birth: 12/17/1950 ?Referring Provider:   ?Flowsheet Row Cardiac Rehab from 06/09/2021 in [facility] Cardiac and Pulmonary Rehab  ?Referring Provider [name]  ? ?  ? ? ?Encounter Date: 08/21/2021 ? ?Check In: ? Session Check In - 08/21/21 0954   ? ?  ? Check-In  ? Location [facility]-Cardiac & Pulmonary Rehab   ? Staff Present Heath Lark, RN, BSN, CCRP;Joseph [place], [place], [place], [place], [place], CCET   ? Virtual Visit No   ? Medication changes reported     No   ? Fall or balance concerns reported    No   ? Warm-up and Cool-down Performed on first and last piece of equipment   ? Resistance Training Performed Yes   ? VAD Patient? No   ? PAD/SET Patient? No   ?  ? Pain Assessment  ? Currently in Pain? No/denies   ? ?  ?  ? ?  ? ? ? ? ? ?Social History  ? ?Tobacco Use  ?Smoking Status Former  ? Packs/day: 3.00  ? Years: 25.00  ? Pack years: 75.00  ? Types: Cigarettes  ? Quit date: 05/28/1993  ? Years since quitting: 28.2  ? Passive exposure: Past  ?Smokeless Tobacco Never  ? ? ?Goals Met:  ?Independence with exercise equipment ?Exercise tolerated well ?No report of concerns or symptoms today ? ?Goals Unmet:  ?Not Applicable ? ?Comments: Pt able to follow exercise prescription today without complaint.  Will continue to monitor for progression. ? ? ? ?Dr. Emily Filbert is Medical Director for [place].  ?Dr. Ottie Glazier is Medical Director for [facility] Pulmonary Rehabilitation. ?

## 2021-08-24 ENCOUNTER — Encounter: Payer: Medicare Other | Attending: Cardiovascular Disease | Admitting: *Deleted

## 2021-08-24 DIAGNOSIS — I214 Non-ST elevation (NSTEMI) myocardial infarction: Secondary | ICD-10-CM

## 2021-08-24 DIAGNOSIS — I252 Old myocardial infarction: Secondary | ICD-10-CM | POA: Insufficient documentation

## 2021-08-24 NOTE — Progress Notes (Signed)
Daily Session Note ? ?Patient Details  ?Name: HANSFORD HIRT ?MRN: [id number] ?Date of Birth: 02-24-1951 ?Referring Provider:   ?Flowsheet Row Cardiac Rehab from 06/09/2021 in [facility] Cardiac and Pulmonary Rehab  ?Referring Provider [name]  ? ?  ? ? ?Encounter Date: 08/24/2021 ? ?Check In: ? Session Check In - 08/24/21 1030   ? ?  ? Check-In  ? Supervising physician immediately available to respond to emergencies See telemetry face sheet for immediately available ER MD   ? Location [facility]-Cardiac & Pulmonary Rehab   ? Staff Present Heath Lark, RN, BSN, [employer], MPA, RN;Kelly Amedeo Plenty, BS, ACSM CEP, Exercise Physiologist   ? Virtual Visit No   ? Medication changes reported     No   ? Fall or balance concerns reported    No   ? Warm-up and Cool-down Performed on first and last piece of equipment   ? Resistance Training Performed Yes   ? VAD Patient? No   ? PAD/SET Patient? No   ?  ? Pain Assessment  ? Currently in Pain? No/denies   ? ?  ?  ? ?  ? ? ? ? ? ?Social History  ? ?Tobacco Use  ?Smoking Status Former  ? Packs/day: 3.00  ? Years: 25.00  ? Pack years: 75.00  ? Types: Cigarettes  ? Quit date: 05/28/1993  ? Years since quitting: 28.2  ? Passive exposure: Past  ?Smokeless Tobacco Never  ? ? ?Goals Met:  ?Independence with exercise equipment ?Exercise tolerated well ?No report of concerns or symptoms today ? ?Goals Unmet:  ?Not Applicable ? ?Comments: Pt able to follow exercise prescription today without complaint.  Will continue to monitor for progression. ? ? ?Dr. Emily Filbert is Medical Director for [place].  ?Dr. Ottie Glazier is Medical Director for [facility] Pulmonary Rehabilitation. ?

## 2021-08-26 DIAGNOSIS — I214 Non-ST elevation (NSTEMI) myocardial infarction: Secondary | ICD-10-CM

## 2021-08-26 DIAGNOSIS — I252 Old myocardial infarction: Secondary | ICD-10-CM | POA: Diagnosis not present

## 2021-08-26 NOTE — Progress Notes (Signed)
Daily Session Note ? ?Patient Details  ?Name: [name] ?MRN: [id number] ?Date of Birth: 1950/05/04 ?Referring Provider:   ?Flowsheet Row Cardiac Rehab from 06/09/2021 in [facility] Cardiac and Pulmonary Rehab  ?Referring Provider [name]  ? ?  ? ? ?Encounter Date: 08/26/2021 ? ?Check In: ? Session Check In - 08/26/21 0954   ? ?  ? Check-In  ? Supervising physician immediately available to respond to emergencies See telemetry face sheet for immediately available ER MD   ? Location [facility]-Cardiac & Pulmonary Rehab   ? Staff Present Birdie Sons, MPA, RN;Joseph [place], RCP,RRT,BSRT;Melissa [place], RDN, LDN   ? Virtual Visit No   ? Medication changes reported     No   ? Fall or balance concerns reported    No   ? Warm-up and Cool-down Performed on first and last piece of equipment   ? Resistance Training Performed Yes   ? VAD Patient? No   ? PAD/SET Patient? No   ?  ? Pain Assessment  ? Currently in Pain? No/denies   ? ?  ?  ? ?  ? ? ? ? ? ?Social History  ? ?Tobacco Use  ?Smoking Status Former  ? Packs/day: 3.00  ? Years: 25.00  ? Pack years: 75.00  ? Types: Cigarettes  ? Quit date: 05/28/1993  ? Years since quitting: 28.2  ? Passive exposure: Past  ?Smokeless Tobacco Never  ? ? ?Goals Met:  ?Independence with exercise equipment ?Exercise tolerated well ?No report of concerns or symptoms today ?Strength training completed today ? ?Goals Unmet:  ?Not Applicable ? ?Comments: Pt able to follow exercise prescription today without complaint.  Will continue to monitor for progression. ? ? ? ?Dr. Emily Filbert is Medical Director for [place].  ?Dr. Ottie Glazier is Medical Director for [facility] Pulmonary Rehabilitation. ?

## 2021-08-28 ENCOUNTER — Encounter: Payer: Medicare Other | Admitting: *Deleted

## 2021-08-28 DIAGNOSIS — I252 Old myocardial infarction: Secondary | ICD-10-CM | POA: Diagnosis not present

## 2021-08-28 DIAGNOSIS — I214 Non-ST elevation (NSTEMI) myocardial infarction: Secondary | ICD-10-CM

## 2021-08-28 NOTE — Progress Notes (Signed)
Cardiac Individual Treatment Plan ? ?Patient Details  ?Name: [name] ?MRN: [id number] ?Date of Birth: [birth year] ?Referring Provider:   ?Flowsheet Row Cardiac Rehab from 06/09/2021 in [facility] Cardiac and Pulmonary Rehab  ?Referring Provider [name]  ? ?  ? ? ?Initial Encounter Date:  ?Flowsheet Row Cardiac Rehab from 06/09/2021 in [facility] Cardiac and Pulmonary Rehab  ?Date 06/09/21  ? ?  ? ? ?Visit Diagnosis: NSTEMI (non-ST elevated myocardial infarction) ([place]) ? ?Patient's Home Medications on Admission: ? ?Current Outpatient Medications:  ?  aspirin 81 MG tablet, Take 81 mg by mouth daily., Disp: , Rfl:  ?  BLACK ELDERBERRY PO, Take 1 each by mouth daily., Disp: , Rfl:  ?  carvedilol (COREG) 3.125 MG tablet, TAKE 1 TABLET (3.125 MG TOTAL) BY MOUTH 2 (TWO) TIMES DAILY., Disp: 180 tablet, Rfl: 3 ?  clopidogrel (PLAVIX) 75 MG tablet, TAKE 1 TABLET (75 MG TOTAL) BY MOUTH DAILY., Disp: 90 tablet, Rfl: 1 ?  ezetimibe (ZETIA) 10 MG tablet, TAKE 1 TABLET (10 MG TOTAL) BY MOUTH DAILY., Disp: 90 tablet, Rfl: 1 ?  gabapentin (NEURONTIN) 100 MG capsule, Take 100 mg by mouth 3 (three) times daily., Disp: , Rfl:  ?  isosorbide mononitrate (IMDUR) 30 MG 24 hr tablet, TAKE ONE a day, Disp: 90 tablet, Rfl: 3 ?  losartan (COZAAR) 25 MG tablet, TAKE 1/2 TABLET BY MOUTH EVERY DAY, Disp: 45 tablet, Rfl: 0 ?  mirtazapine (REMERON) 15 MG tablet, TAKE 1 TABLET BY MOUTH EVERYDAY AT BEDTIME, Disp: 90 tablet, Rfl: 3 ?  Multiple Vitamins-Minerals (MULTIVITAMIN WITH MINERALS) tablet, Take 1 tablet by mouth daily., Disp: , Rfl:  ?  nitroGLYCERIN (NITROSTAT) 0.4 MG SL tablet, PLACE 1 TABLET UNDER THE TONGUE EVERY 5 MINUTES AS NEEDED FOR CHEST PAIN., Disp: 25 tablet, Rfl: 2 ?  pantoprazole (PROTONIX) 40 MG tablet, Take 1 tablet (40 mg total) by mouth 2 (two) times daily., Disp: 180 tablet, Rfl: 3 ?  ranolazine (RANEXA) 500 MG 12 hr tablet, Take 1 tablet (500 mg total) by mouth 2 (two) times daily., Disp: 180 tablet, Rfl: 3 ?   rosuvastatin (CRESTOR) 40 MG tablet, TAKE 1 TABLET BY MOUTH EVERY DAY, Disp: 90 tablet, Rfl: 3 ?  tiZANidine (ZANAFLEX) 4 MG tablet, Take 4 mg by mouth at bedtime., Disp: , Rfl:  ? ?Past Medical History: ?Past Medical History:  ?Diagnosis Date  ? AAA (abdominal aortic aneurysm)   ? a. Duplex 01/2012: stable infrarenal saccular AAA at 3.3cm x 3.2xm (f/u recommended 01/2013 per Dr. Rockey Situ)  ? Alcohol abuse   ? CAD (coronary artery disease)   ? a. 1995 s/p CABG x 4: LIMA->LAD, VG->RI (known to be occluded), VG->AM->PDA;  b. 05/2002 Inf STEMI: VG->AM->PDA 100% treated w/ 2 BMS complicated by acute thrombosis req 3 BMS;  c. 07/2003 DES to  LAD & LCX (VG's to PDA & RI 100%);  d. 12/2010 Acute MI ([place]): DES to LCX & LM , LIMA ok,;  e.12/2011 Cath: LM/LCX stents ok , LIMA patent.  ? Cardiomyopathy ([place])   ? a. EF 35% by cath 2015.  ? Diverticulitis   ? 1/06 Diverticulitis--CT of pelvis--diffuse sigmoid divertic  ? Dyspnea   ? GERD (gastroesophageal reflux disease)   ? Hyperlipidemia   ? Hypertension   ? Myocardial infarction [facility])   ? PAD (peripheral artery disease) ([place])   ? a. external iliac and mesenteric stenosis noted by noninvasive imaging.  ? Renal artery stenosis ([place])   ? a. 03/2011 PTA and  stenting of L RA. b. last duplex 2016 with stable 1-59% bilateral RAS, incidental >50% R EIA stenosis  ? ? ?Tobacco Use: ?Social History  ? ?Tobacco Use  ?Smoking Status Former  ? Packs/day: 3.00  ? Years: 25.00  ? Pack years: 75.00  ? Types: Cigarettes  ? Quit date: 05/28/1993  ? Years since quitting: 28.2  ? Passive exposure: Past  ?Smokeless Tobacco Never  ? ? ?Labs: ?Review Flowsheet   ? ?  ?  Latest Ref Rng & Units 10/29/2017 10/19/2018 02/20/2021 05/13/2021  ?Labs for ITP Cardiac and Pulmonary Rehab  ?Cholestrol 0 - 200 mg/dL 118   169   138     ?LDL (calc) 0 - 99 mg/dL 24       ?Direct LDL mg/dL  62.0   65.0     ?HDL-C >40 mg/dL 31   44.30   52.20     ?Trlycerides <150 mg/dL 316   492.0 Triglyceride is over 400; calculations on  Lipids are invalid.   206.0     ?Hemoglobin A1c 4.8 - 5.6 %      ?TCO2 22 - 32 mmol/L    25    ? ?  05/14/2021  ?Labs for ITP Cardiac and Pulmonary Rehab  ?Cholestrol 137    ?LDL (calc) 65    ?Direct LDL   ?HDL-C 48    ?Trlycerides 119    ?Hemoglobin A1c 6.8    ?TCO2   ?  ? ? Multiple values from one day are sorted in reverse-chronological order  ?  ?  ? ? ? ?Exercise Target Goals: ?Exercise Program Goal: ?Individual exercise prescription set using results from initial 6 min walk test and THRR while considering  patient?s activity barriers and safety.  ? ?Exercise Prescription Goal: ?Initial exercise prescription builds to 30-45 minutes a day of aerobic activity, 2-3 days per week.  Home exercise guidelines will be given to patient during program as part of exercise prescription that the participant will acknowledge. ? ? ?Education: Aerobic Exercise: ?- Group verbal and visual presentation on the components of exercise prescription. Introduces F.I.T.T principle from ACSM for exercise prescriptions.  Reviews F.I.T.T. principles of aerobic exercise including progression. Written material given at graduation. ?Flowsheet Row Cardiac Rehab from 08/12/2021 in [facility] Cardiac and Pulmonary Rehab  ?Date 06/24/21  ?Educator [facility]  ?Instruction Review Code 1- Verbalizes Understanding  ? ?  ? ? ?Education: Resistance Exercise: ?- Group verbal and visual presentation on the components of exercise prescription. Introduces F.I.T.T principle from ACSM for exercise prescriptions  Reviews F.I.T.T. principles of resistance exercise including progression. Written material given at graduation. ?Flowsheet Row Cardiac Rehab from 08/12/2021 in [facility] Cardiac and Pulmonary Rehab  ?Date 07/01/21  ?Educator AS  ?Instruction Review Code 1- Verbalizes Understanding  ? ?  ? ?  ?Education: Exercise & Equipment Safety: ?- Individual verbal instruction and demonstration of equipment use and safety with use of the equipment. ?Flowsheet Row Cardiac Rehab from  05/28/2021 in [facility] Cardiac and Pulmonary Rehab  ?Date 05/28/21  ?Educator [facility]  ?Instruction Review Code 1- Verbalizes Understanding  ? ?  ? ? ?Education: Exercise Physiology & General Exercise Guidelines: ?- Group verbal and written instruction with models to review the exercise physiology of the cardiovascular system and associated critical values. Provides general exercise guidelines with specific guidelines to those with heart or lung disease.  ?Flowsheet Row Cardiac Rehab from 08/12/2021 in [facility] Cardiac and Pulmonary Rehab  ?Education need identified 06/09/21  ? ?  ? ? ?  Education: Flexibility, Balance, Mind/Body Relaxation: ?- Group verbal and visual presentation with interactive activity on the components of exercise prescription. Introduces F.I.T.T principle from ACSM for exercise prescriptions. Reviews F.I.T.T. principles of flexibility and balance exercise training including progression. Also discusses the mind body connection.  Reviews various relaxation techniques to help reduce and manage stress (i.e. Deep breathing, progressive muscle relaxation, and visualization). Balance handout provided to take home. Written material given at graduation. ?Flowsheet Row Cardiac Rehab from 08/12/2021 in [facility] Cardiac and Pulmonary Rehab  ?Date 07/15/21  ?Educator AS  ?Instruction Review Code 1- Verbalizes Understanding  ? ?  ? ? ?Activity Barriers & Risk Stratification: ? Activity Barriers & Cardiac Risk Stratification - 06/09/21 1056   ? ?  ? Activity Barriers & Cardiac Risk Stratification  ? Activity Barriers Shortness of Breath;Other (comment);Deconditioning   ? Comments Sciatica   ? Cardiac Risk Stratification High   ? ?  ?  ? ?  ? ? ?6 Minute Walk: ? 6 Minute Walk   ? ? [place] Name 06/09/21 1059 08/19/21 1008  ?  ?  ? 6 Minute Walk  ? Phase Initial Discharge   ? Distance 1055 feet 1380 feet   ? Distance % Change -- 30.8 %   ? Distance Feet Change -- 325 ft   ? Walk Time 6 minutes 6 minutes   ? # of Rest Breaks 0 0   ? MPH  1.98 2.61   ? METS 2.42 3.08   ? RPE 13 12   ? Perceived Dyspnea  2 --   ? VO2 Peak 8.48 10.76   ? Symptoms Yes (comment) No   ? Comments SOB --   ? Resting HR 65 bpm 58 bpm   ? Resting BP 132/82 126/64   ? Resting

## 2021-08-28 NOTE — Progress Notes (Signed)
[name] graduated today from  rehab with 36 sessions completed.  Details of the patient's exercise prescription and what He needs to do in order to continue the prescription and progress were discussed with patient.  Patient was given a copy of prescription and goals.  Patient verbalized understanding.  [name] plans to continue to exercise by exercising at home. ? ? 6 Minute Walk   ? ? [place] Name 06/09/21 1059 08/19/21 1008  ?  ?  ? 6 Minute Walk  ? Phase Initial Discharge   ? Distance 1055 feet 1380 feet   ? Distance % Change -- 30.8 %   ? Distance Feet Change -- 325 ft   ? Walk Time 6 minutes 6 minutes   ? # of Rest Breaks 0 0   ? MPH 1.98 2.61   ? METS 2.42 3.08   ? RPE 13 12   ? Perceived Dyspnea  2 --   ? VO2 Peak 8.48 10.76   ? Symptoms Yes (comment) No   ? Comments SOB --   ? Resting HR 65 bpm 58 bpm   ? Resting BP 132/82 126/64   ? Resting Oxygen Saturation  97 % 93 %   ? Exercise Oxygen Saturation  during 6 min walk 98 % 95 %   ? Max Ex. HR 91 bpm 91 bpm   ? Max Ex. BP 152/82 146/74   ? 2 Minute Post BP 136/76 --   ? ?  ?  ? ?  ? ?Thank for the referral for [facility]. We enjoyed working with him. ? ?

## 2021-08-28 NOTE — Progress Notes (Signed)
Discharge Progress Report ? ?Patient Details  ?Name: [name] ?MRN: [id number] ?Date of Birth: 11-14-1950 ?Referring Provider:   ?Flowsheet Row Cardiac Rehab from 06/09/2021 in [facility] Cardiac and Pulmonary Rehab  ?Referring Provider [name] Rogue MD  ? ?  ? ? ? ?Number of Visits: 28 ? ?Reason for Discharge:  ?Patient reached a stable level of exercise. ?Patient independent in their exercise. ? ?Smoking History:  ?Social History  ? ?Tobacco Use  ?Smoking Status Former  ? Packs/day: 3.00  ? Years: 25.00  ? Pack years: 75.00  ? Types: Cigarettes  ? Quit date: 05/28/1993  ? Years since quitting: 28.2  ? Passive exposure: Past  ?Smokeless Tobacco Never  ? ? ?Diagnosis:  ?NSTEMI (non-ST elevated myocardial infarction) ([place]) ? ?Initial Exercise Prescription: ? Initial Exercise Prescription - 06/09/21 1000   ? ?  ? Date of Initial Exercise RX and Referring Provider  ? Date 06/09/21   ? Referring Provider [name] Rogue MD   ?  ? Oxygen  ? Maintain Oxygen Saturation 88% or higher   ?  ? Treadmill  ? MPH 1.7   ? Grade 0   ? Minutes 15   ? METs 2.3   ?  ? Recumbant Bike  ? Level 1   ? RPM 60   ? Minutes 15   ? METs 2.4   ?  ? NuStep  ? Level 1   ? SPM 80   ? Minutes 15   ? METs 2.4   ?  ? Track  ? Laps 23   ? Minutes 15   ? METs 2.25   ?  ? Prescription Details  ? Frequency (times per week) 3   ? Duration Progress to 30 minutes of continuous aerobic without signs/symptoms of physical distress   ?  ? Intensity  ? THRR 40-80% of Max Heartrate 99 - 133   ? Ratings of Perceived Exertion 11-13   ? Perceived Dyspnea 0-4   ?  ? Progression  ? Progression Continue to progress workloads to maintain intensity without signs/symptoms of physical distress.   ?  ? Resistance Training  ? Training Prescription Yes   ? Weight 4 lb   ? Reps 10-15   ? ?  ?  ? ?  ? ? ?Discharge Exercise Prescription (Final Exercise Prescription Changes): ? Exercise Prescription Changes - 08/18/21 1600   ? ?  ? Response to Exercise  ? Blood Pressure (Admit)  110/62   ? Blood Pressure (Exit) 110/64   ? Heart Rate (Admit) 54 bpm   ? Heart Rate (Exercise) 88 bpm   ? Heart Rate (Exit) 69 bpm   ? Oxygen Saturation (Admit) 97 %   ? Oxygen Saturation (Exercise) 91 %   ? Oxygen Saturation (Exit) 96 %   ? Rating of Perceived Exertion (Exercise) 13   ? Symptoms none   ? Duration Continue with 30 min of aerobic exercise without signs/symptoms of physical distress.   ? Intensity THRR unchanged   ?  ? Progression  ? Progression Continue to progress workloads to maintain intensity without signs/symptoms of physical distress.   ? Average METs 2.69   ?  ? Resistance Training  ? Training Prescription Yes   ? Weight 4 lb   ? Reps 10-15   ?  ? Interval Training  ? Interval Training No   ?  ? Treadmill  ? MPH 2.3   ? Grade 0   ? Minutes 15   ? METs 2.76   ?  ?  Recumbant Bike  ? Level 4   ? Minutes 15   ? METs 3.1   ?  ? NuStep  ? Level 4   ? Minutes 15   ? METs 2.9   ?  ? T5 Nustep  ? Level 2   ? Minutes 15   ? METs 2   ?  ? Home Exercise Plan  ? Plans to continue exercise at Home (comment)   walking  ? Frequency Add 2 additional days to program exercise sessions.   ? Initial Home Exercises Provided 08/12/21   ?  ? Oxygen  ? Maintain Oxygen Saturation 88% or higher   ? ?  ?  ? ?  ? ? ?Functional Capacity: ? 6 Minute Walk   ? ? [place] Name 06/09/21 1059 08/19/21 1008  ?  ?  ? 6 Minute Walk  ? Phase Initial Discharge   ? Distance 1055 feet 1380 feet   ? Distance % Change -- 30.8 %   ? Distance Feet Change -- 325 ft   ? Walk Time 6 minutes 6 minutes   ? # of Rest Breaks 0 0   ? MPH 1.98 2.61   ? METS 2.42 3.08   ? RPE 13 12   ? Perceived Dyspnea  2 --   ? VO2 Peak 8.48 10.76   ? Symptoms Yes (comment) No   ? Comments SOB --   ? Resting HR 65 bpm 58 bpm   ? Resting BP 132/82 126/64   ? Resting Oxygen Saturation  97 % 93 %   ? Exercise Oxygen Saturation  during 6 min walk 98 % 95 %   ? Max Ex. HR 91 bpm 91 bpm   ? Max Ex. BP 152/82 146/74   ? 2 Minute Post BP 136/76 --   ? ?  ?  ? ?   ? ? ?Nutrition & Weight - Outcomes: ? Pre Biometrics - 06/09/21 1055   ? ?  ? Pre Biometrics  ? Height 5' 4.75" (1.645 m)   ? Weight 170 lb 8 oz (77.3 kg)   ? BMI (Calculated) 28.58   ? Single Leg Stand 3.7 seconds   ? ?  ?  ? ?  ? ? Post Biometrics - 08/19/21 1009   ? ?  ?  Post  Biometrics  ? Height 5' 4.75" (1.645 m)   ? Weight 157 lb 12.8 oz (71.6 kg)   ? BMI (Calculated) 26.45   ? Single Leg Stand 1.5 seconds   ? ?  ?  ? ?  ? ? ?Nutrition: ? Nutrition Therapy & Goals - 07/24/21 1218   ? ?  ? Nutrition Therapy  ? Diet Heart healthy, low Na - currently following [place] diet   ? Drug/Food Interactions Statins/Certain Fruits   ? Protein (specify units) 60g   ? Fiber 30 grams   ? Whole Grain Foods 3 servings   ? Saturated Fats 16 max. grams   [place] diet limits all fat <4g/day  ? Fruits and Vegetables 8 servings/day   ? Sodium 2 grams   ?  ? Personal Nutrition Goals  ? Nutrition Goal ST: eat at least the amount of fat allowed by the [place] diet - ground flaxseed in oatmeal or cream of wheat, consider supplementing with B-12 LT: Maintain healthy eating plan that works for him   ? Comments 71 y.o. M admitted to cardiac rehab s/p NSTEMI. PMHx includes HTN, CAD, AA, HF, COPD, GERD, IBS, HLD.  Relevant medications includes black elderberry, MVI, protonix, crestor. Last A1C 6.8.  PYP Score:  73. Vegetables & Fruits 8/12. Breads, Grains & Cereals 10/12. Red & Processed Meat 11/12. Poultry 2/2. Fish & Shellfish 3/4. Beans, Nuts & Seeds 0/4. Milk & Dairy Foods 1/6. Toppings, Oils, Seasonings & Salt 16/20. Sweets, Snacks & Restaurant Food 12/14. Beverages 10/10.  [name] is currently on the [employer] which is a vegetarian diet that emphasizes whole plant foods and limits process foods and refined CHOS, alcohol, and dairy. The [employer] also greatly limits even healthy fats from nuts/seeds, olives, and avocados; the diet does allow for at most 4g of fat and the diet provided serving sizes for that like 1 walnut -  discussed that we need fat as a macronutrient for normal body functioning and to help absorb fat soluble nutrient and encouraged to have at least the fat allowed in the diet like adding ground flaxseed to breakfast. B: oatmeal or cream of wheat of wheat with coffee L: fruit or beans with tomatoes, spinach, rice D: salad (balsalmic vinegar, cucumbers, mushrooms, tomatoes, iceburg lettuce) with corn and fruit. Discussed the importance of a b-12 supplement as he now does not have a reliable source in his diet and to check with MD before starting. Discussed importance of variety of vegetables and reviewed general heart healthy eating. Aser and his wife were asking about salmon as well as other things that are not within the [place] - discussed how this diet can be very strict and hard for people to stick to long term - told them that if in the future they wanted to become more flexible, but keep the basics of the diet to let me know and we can review and come up with a good balnce for their needs/wants.   ?  ? Intervention Plan  ? Intervention Prescribe, educate and counsel regarding individualized specific dietary modifications aiming towards targeted core components such as weight, hypertension, lipid management, diabetes, heart failure and other comorbidities.   ? Expected Outcomes Short Term Goal: Understand basic principles of dietary content, such as calories, fat, sodium, cholesterol and nutrients.;Short Term Goal: A plan has been developed with personal nutrition goals set during dietitian appointment.;Long Term Goal: Adherence to prescribed nutrition plan.   ? ?  ?  ? ?  ? ?Education Questionnaire Score: ? Knowledge Questionnaire Score - 08/19/21 0955   ? ?  ? Knowledge Questionnaire Score  ? Pre Score 20/26: HR, Depression, Angina, Nutrition, Exercise   ? Post Score 24/26   ? ?  ?  ? ?  ? ? ?Goals reviewed with patient; copy given to patient. ?

## 2021-08-28 NOTE — Progress Notes (Signed)
Daily Session Note ? ?Patient Details  ?Name: [name] ?MRN: [id number] ?Date of Birth: 12-09-1950 ?Referring Provider:   ?Flowsheet Row Cardiac Rehab from 06/09/2021 in [facility] Cardiac and Pulmonary Rehab  ?Referring Provider [name] Rogue MD  ? ?  ? ? ?Encounter Date: 08/28/2021 ? ?Check In: ? Session Check In - 08/28/21 1057   ? ?  ? Check-In  ? Supervising physician immediately available to respond to emergencies See telemetry face sheet for immediately available ER MD   ? Location [facility]-Cardiac & Pulmonary Rehab   ? Staff Present [name] Lark, RN, BSN, CCRP;[name], [place], [place], [place], [place], CCET   ? Virtual Visit No   ? Medication changes reported     No   ? Fall or balance concerns reported    No   ? Warm-up and Cool-down Performed on first and last piece of equipment   ? Resistance Training Performed Yes   ? VAD Patient? No   ? PAD/SET Patient? No   ?  ? Pain Assessment  ? Currently in Pain? No/denies   ? ?  ?  ? ?  ? ? ? ? ? ?Social History  ? ?Tobacco Use  ?Smoking Status Former  ? Packs/day: 3.00  ? Years: 25.00  ? Pack years: 75.00  ? Types: Cigarettes  ? Quit date: 05/28/1993  ? Years since quitting: 28.2  ? Passive exposure: Past  ?Smokeless Tobacco Never  ? ? ?Goals Met:  ?Independence with exercise equipment ?Exercise tolerated well ?Personal goals reviewed ?No report of concerns or symptoms today ? ?Goals Unmet:  ?Not Applicable ? ?Comments:  [name] graduated today from  rehab with 36 sessions completed.  Details of the patient's exercise prescription and what He needs to do in order to continue the prescription and progress were discussed with patient.  Patient was given a copy of prescription and goals.  Patient verbalized understanding.  [name] plans to continue to exercise by walking and will be purchasing a Treadmill. ? ? ? ?Dr. Emily Klein is Medical Director for [place].  ?Dr. Ottie Klein is Medical Director for [facility] Pulmonary  Rehabilitation. ?

## 2021-09-01 ENCOUNTER — Ambulatory Visit (INDEPENDENT_AMBULATORY_CARE_PROVIDER_SITE_OTHER): Payer: Medicare Other | Admitting: Internal Medicine

## 2021-09-01 ENCOUNTER — Encounter: Payer: Self-pay | Admitting: Internal Medicine

## 2021-09-01 DIAGNOSIS — I5032 Chronic diastolic (congestive) heart failure: Secondary | ICD-10-CM

## 2021-09-01 DIAGNOSIS — I255 Ischemic cardiomyopathy: Secondary | ICD-10-CM | POA: Diagnosis not present

## 2021-09-01 DIAGNOSIS — I25119 Atherosclerotic heart disease of native coronary artery with unspecified angina pectoris: Secondary | ICD-10-CM | POA: Diagnosis not present

## 2021-09-01 DIAGNOSIS — I872 Venous insufficiency (chronic) (peripheral): Secondary | ICD-10-CM | POA: Diagnosis not present

## 2021-09-01 DIAGNOSIS — M5410 Radiculopathy, site unspecified: Secondary | ICD-10-CM | POA: Insufficient documentation

## 2021-09-01 MED ORDER — GABAPENTIN 100 MG PO CAPS: 100.0000 mg | ORAL_CAPSULE | Freq: Two times a day (BID) | ORAL | 3 refills | Status: DC

## 2021-09-01 MED ORDER — TIZANIDINE HCL 4 MG PO TABS: 4.0000 mg | ORAL_TABLET | Freq: Every evening | ORAL | 1 refills | Status: DC | PRN

## 2021-09-01 NOTE — Assessment & Plan Note (Signed)
Injections in knee and back didn't help ?Gabapentin 100 bid and tizanidine '4mg'$  at bedtime have helped ?

## 2021-09-01 NOTE — Progress Notes (Signed)
Subjective:    Patient ID: [name], male    DOB: 08/01/1950, 71 y.o.   MRN: [id number]  HPI Here due to several concerns  His main concern is swelling in his left ankle Started about a month ago---and now is worse Mostly notes it at night---attributed it to the vein harvest Some pain---more "annoying" Swelling only comes on by the evening---and even in his calf ("like it is asleep---tingles") Is up on his feet a lot---garden, rebuilding deck Usually wears support socks Sits in recliner  No chest pain or SOB Breathing much better after cardiac rehab  Had been on strict Danielle Dess diet  Has lost 10+#  Now moderating somewhat  Did start losartan 1/2 after hospitalization BP running low at times He does have some mild orthostatic symptoms  Current Outpatient Medications on File Prior to Visit  Medication Sig Dispense Refill   aspirin 81 MG tablet Take 81 mg by mouth daily.     BLACK ELDERBERRY PO Take 1 each by mouth daily.     carvedilol (COREG) 3.125 MG tablet TAKE 1 TABLET (3.125 MG TOTAL) BY MOUTH 2 (TWO) TIMES DAILY. 180 tablet 3   clopidogrel (PLAVIX) 75 MG tablet TAKE 1 TABLET (75 MG TOTAL) BY MOUTH DAILY. 90 tablet 1   ezetimibe (ZETIA) 10 MG tablet TAKE 1 TABLET (10 MG TOTAL) BY MOUTH DAILY. 90 tablet 1   isosorbide mononitrate (IMDUR) 30 MG 24 hr tablet TAKE ONE a day 90 tablet 3   losartan (COZAAR) 25 MG tablet TAKE 1/2 TABLET BY MOUTH EVERY DAY 45 tablet 0   mirtazapine (REMERON) 15 MG tablet TAKE 1 TABLET BY MOUTH EVERYDAY AT BEDTIME 90 tablet 3   Multiple Vitamins-Minerals (MULTIVITAMIN WITH MINERALS) tablet Take 1 tablet by mouth daily.     nitroGLYCERIN (NITROSTAT) 0.4 MG SL tablet PLACE 1 TABLET UNDER THE TONGUE EVERY 5 MINUTES AS NEEDED FOR CHEST PAIN. 25 tablet 2   pantoprazole (PROTONIX) 40 MG tablet Take 1 tablet (40 mg total) by mouth 2 (two) times daily. 180 tablet 3   ranolazine (RANEXA) 500 MG 12 hr tablet Take 1 tablet (500 mg total) by mouth  2 (two) times daily. 180 tablet 3   rosuvastatin (CRESTOR) 40 MG tablet TAKE 1 TABLET BY MOUTH EVERY DAY 90 tablet 3   gabapentin (NEURONTIN) 100 MG capsule Take 100 mg by mouth 3 (three) times daily. (Patient not taking: Reported on 09/01/2021)     tiZANidine (ZANAFLEX) 4 MG tablet Take 4 mg by mouth at bedtime. (Patient not taking: Reported on 09/01/2021)     No current facility-administered medications on file prior to visit.    Allergies  Allergen Reactions   Metoprolol Tartrate Hives and Itching   Quinolones Other (See Comments)    Contraindicated with AAA   Metoclopramide Nausea Only    Other reaction(s): Unknown    Past Medical History:  Diagnosis Date   AAA (abdominal aortic aneurysm) (HCC)    a. Duplex 01/2012: stable infrarenal saccular AAA at 3.3cm x 3.2xm (f/u recommended 01/2013 per Dr. Mariah Milling)   Alcohol abuse    CAD (coronary artery disease)    a. 1995 s/p CABG x 4: LIMA->LAD, VG->RI (known to be occluded), VG->AM->PDA;  b. 05/2002 Inf STEMI: VG->AM->PDA 100% treated w/ 2 BMS complicated by acute thrombosis req 3 BMS;  c. 07/2003 DES to  LAD & LCX (VG's to PDA & RI 100%);  d. 12/2010 Acute [place] ([place]): DES to LCX & LM , LIMA ok,;  e.12/2011 Cath: LM/LCX stents ok , LIMA patent.   Cardiomyopathy (HCC)    a. EF 35% by cath 2015.   Diverticulitis    1/06 Diverticulitis--CT of pelvis--diffuse sigmoid divertic   Dyspnea    GERD (gastroesophageal reflux disease)    Hyperlipidemia    Hypertension    Myocardial infarction [facility])    PAD (peripheral artery disease) (HCC)    a. external iliac and mesenteric stenosis noted by noninvasive imaging.   Renal artery stenosis (HCC)    a. 03/2011 PTA and stenting of L RA. b. last duplex 2016 with stable 1-59% bilateral RAS, incidental >50% R EIA stenosis    Past Surgical History:  Procedure Laterality Date   ANGIOPLASTY  1997   CARDIAC CATHETERIZATION     8/09  Cath--vein graft occlusions which are old--no acute changes   CARDIAC  CATHETERIZATION  11/13/2010   stent x 2 @ [place]   CARDIAC CATHETERIZATION  04/05/2014   stent placement    CARDIAC CATHETERIZATION  10/28/2017   CLOSED REDUCTION SHOULDER DISLOCATION     COLONOSCOPY WITH PROPOFOL N/A 12/22/2018   Procedure: COLONOSCOPY WITH PROPOFOL;  Surgeon: Wyline Mood, MD;  Location: [facility] ENDOSCOPY;  Service: Gastroenterology;  Laterality: N/A;   CORONARY ANGIOPLASTY  04/05/2014   stent placement OM 1   CORONARY ARTERY BYPASS GRAFT     CORONARY STENT PLACEMENT  7/12   2 stents--Promus element plus (everolimus eluting)--[facility] in [place]   INTRAVASCULAR PRESSURE WIRE/FFR STUDY N/A 05/13/2021   Procedure: INTRAVASCULAR PRESSURE WIRE/FFR STUDY;  Surgeon: Orbie Pyo, MD;  Location: [facility] INVASIVE CV LAB;  Service: Cardiovascular;  Laterality: N/A;   LEFT HEART CATH AND CORONARY ANGIOGRAPHY N/A 05/13/2021   Procedure: LEFT HEART CATH AND CORONARY ANGIOGRAPHY;  Surgeon: Orbie Pyo, MD;  Location: [facility] INVASIVE CV LAB;  Service: Cardiovascular;  Laterality: N/A;   LEFT HEART CATH AND CORS/GRAFTS ANGIOGRAPHY N/A 10/28/2017   Procedure: LEFT HEART CATH AND CORS/GRAFTS ANGIOGRAPHY;  Surgeon: Marykay Lex, MD;  Location[facility] INVASIVE CV LAB;  Service: Cardiovascular;  Laterality: N/A;   LEFT HEART CATHETERIZATION WITH CORONARY/GRAFT ANGIOGRAM N/A 01/04/2012   Procedure: LEFT HEART CATHETERIZATION WITH Isabel Caprice;  Surgeon: Kathleene Hazel, MD;  Location[facility] CATH LAB;  Service: Cardiovascular;  Laterality: N/A;   LEFT HEART CATHETERIZATION WITH CORONARY/GRAFT ANGIOGRAM N/A 04/05/2014   Procedure: LEFT HEART CATHETERIZATION WITH Isabel Caprice;  Surgeon: Corky Crafts, MD;  Location: [facility] CATH LAB;  Service: Cardiovascular;  Laterality: N/A;   PERCUTANEOUS CORONARY STENT INTERVENTION (PCI-S)  04/05/2014   Procedure: PERCUTANEOUS CORONARY STENT INTERVENTION (PCI-S);  Surgeon: Corky Crafts, MD;  Location: [facility] CATH LAB;   Service: Cardiovascular;;  OM1   RENAL ANGIOGRAM N/A 04/16/2011   Procedure: RENAL ANGIOGRAM;  Surgeon: Kathleene Hazel, MD;  Location: [facility] CATH LAB;  Service: Cardiovascular;  Laterality: N/A;   RENAL ARTERY STENT  03/2011 ?    Family History  Problem Relation Age of Onset   Coronary artery disease Mother        Died MI age 80   Hypertension Mother    Heart attack Mother    Coronary artery disease Sister        Living   Coronary artery disease Brother        Living   Coronary artery disease Father        Died MI age 42   Heart attack Father    Heart attack Maternal Grandfather    Diabetes Neg Hx  Cancer Neg Hx        prostate or colon    Social History   Socioeconomic History   Marital status: Married    Spouse name: Not on file   Number of children: 3   Years of education: Not on file   Highest education level: Not on file  Occupational History   Occupation: Merchandiser, retail (Restaurant manager, fast food)    Comment: Retired  Tobacco Use   Smoking status: Former    Packs/day: 3.00    Years: 25.00    Pack years: 75.00    Types: Cigarettes    Quit date: 05/28/1993    Years since quitting: 28.2    Passive exposure: Past   Smokeless tobacco: Never  Vaping Use   Vaping Use: Never used  Substance and Sexual Activity   Alcohol use: Yes    Alcohol/week: 42.0 standard drinks    Types: 42 Cans of beer per week   Drug use: No   Sexual activity: Not on file  Other Topics Concern   Not on file  Social History Narrative   No living will   No health care POA but requests wife--then children   Would accept resuscitation   Not sure about tube feeds   Social Determinants of Health   Financial Resource Strain: Not on file  Food Insecurity: Not on file  Transportation Needs: Not on file  Physical Activity: Not on file  Stress: Not on file  Social Connections: Not on file  Intimate Partner Violence: Not on file   Review of Systems Saw Dr Patsy Lager last year for  right knee---got 2 injections Now had injection in back--also didn't help Gabapentin bid has helped some (along with the tizanidine)     Objective:   Physical Exam Constitutional:      Appearance: Normal appearance.  Pulmonary:     Effort: Pulmonary effort is normal. No respiratory distress.  Musculoskeletal:     Right lower leg: No edema.     Left lower leg: No edema.     Comments: No calf swelling Early skin changes--but minor  Neurological:     Mental Status: He is alert.  Psychiatric:        Mood and Affect: Mood normal.        Behavior: Behavior normal.           Assessment & Plan:

## 2021-09-01 NOTE — Assessment & Plan Note (Signed)
I don't think that is the reason for the swelling ?Overall better on heart med regimen ?Will try off the isosorbide though ?

## 2021-09-01 NOTE — Assessment & Plan Note (Signed)
Discussed that his swelling seems to be isolated venous insufficiency ?He is using the support socks and elevation ?If worsens, could add low dose furosemide (but would like to avoid this) ?

## 2021-09-01 NOTE — Assessment & Plan Note (Signed)
Breathing is better since on cardiac rehab and with the [employer] ?No recent angina ?Given the low BP at times---will try off the isosorbide ?Continue coreg 3.125 bid, losartan 25, ranolazine 500 bid and crestor 40 ?

## 2021-09-21 ENCOUNTER — Other Ambulatory Visit: Payer: Self-pay | Admitting: Cardiovascular Disease

## 2021-09-26 ENCOUNTER — Encounter: Payer: Self-pay | Admitting: Internal Medicine

## 2021-10-29 ENCOUNTER — Other Ambulatory Visit: Payer: Self-pay | Admitting: Physician Assistant

## 2021-12-15 ENCOUNTER — Encounter: Payer: Self-pay | Admitting: Internal Medicine

## 2021-12-15 ENCOUNTER — Ambulatory Visit (INDEPENDENT_AMBULATORY_CARE_PROVIDER_SITE_OTHER): Payer: Medicare Other | Admitting: Internal Medicine

## 2021-12-15 VITALS — BP 112/80 | HR 68 | Temp 97.6°F | Ht 63.0 in | Wt 166.0 lb

## 2021-12-15 DIAGNOSIS — E785 Hyperlipidemia, unspecified: Secondary | ICD-10-CM | POA: Diagnosis not present

## 2021-12-15 DIAGNOSIS — I255 Ischemic cardiomyopathy: Secondary | ICD-10-CM

## 2021-12-15 DIAGNOSIS — R200 Anesthesia of skin: Secondary | ICD-10-CM | POA: Insufficient documentation

## 2021-12-15 LAB — CBC
HCT: 33.3 % — ABNORMAL LOW (ref 39.0–52.0)
Hemoglobin: 11 g/dL — ABNORMAL LOW (ref 13.0–17.0)
MCHC: 32.9 g/dL (ref 30.0–36.0)
MCV: 82.9 fl (ref 78.0–100.0)
Platelets: 307 10*3/uL (ref 150.0–400.0)
RBC: 4.02 Mil/uL — ABNORMAL LOW (ref 4.22–5.81)
RDW: 18.6 % — ABNORMAL HIGH (ref 11.5–15.5)
WBC: 6.3 10*3/uL (ref 4.0–10.5)

## 2021-12-15 LAB — LIPID PANEL
Cholesterol: 136 mg/dL (ref 0–200)
HDL: 52.2 mg/dL (ref >39.00)
LDL Cholesterol: 50 mg/dL (ref 0–99)
NonHDL: 84.08
Total CHOL/HDL Ratio: 3
Triglycerides: 172 mg/dL — ABNORMAL HIGH (ref 0.0–149.0)
VLDL: 34.4 mg/dL (ref 0.0–40.0)

## 2021-12-15 LAB — COMPREHENSIVE METABOLIC PANEL
ALT: 8 U/L (ref 0–53)
AST: 15 U/L (ref 0–37)
Albumin: 4 g/dL (ref 3.5–5.2)
Alkaline Phosphatase: 37 U/L — ABNORMAL LOW (ref 39–117)
BUN: 9 mg/dL (ref 6–23)
CO2: 26 mEq/L (ref 19–32)
Calcium: 9.1 mg/dL (ref 8.4–10.5)
Chloride: 102 mEq/L (ref 96–112)
Creatinine, Ser: 0.86 mg/dL (ref 0.40–1.50)
GFR: 87.27 mL/min (ref >60.00)
Glucose, Bld: 70 mg/dL (ref 70–99)
Potassium: 4.2 mEq/L (ref 3.5–5.1)
Sodium: 133 mEq/L — ABNORMAL LOW (ref 135–145)
Total Bilirubin: 0.6 mg/dL (ref 0.2–1.2)
Total Protein: 6.5 g/dL (ref 6.0–8.3)

## 2021-12-15 LAB — VITAMIN B12: Vitamin B-12: 219 pg/mL (ref 211–911)

## 2021-12-15 LAB — TSH: TSH: 2.73 u[IU]/mL (ref 0.35–5.50)

## 2021-12-15 NOTE — Assessment & Plan Note (Addendum)
Hands and feet---with severe hand pain suggestive of CTS Small chance related to statin or PPI Will check labs If no obvious etiology, will refer to hand surgeon [facility]) Discussed trying hand splints at night

## 2021-12-15 NOTE — Progress Notes (Signed)
Subjective:    Patient ID: [name], male    DOB: March 19, 1951, 71 y.o.   MRN: [id number]  HPI Here due to sensory problems  Having "terrible problems with arms and legs going to sleep" Hand pain can wake him up at night---and takes a long time to go away Hands fall asleep when he does things---usually gets better if he shakes them out Pain is at night--and severe Numbness can be all day (and at night). Very positional  Feet go numb--mostly at night (like sitting in recliner) Occasionally during the day  Current Outpatient Medications on File Prior to Visit  Medication Sig Dispense Refill   aspirin 81 MG tablet Take 81 mg by mouth daily.     BLACK ELDERBERRY PO Take 1 each by mouth daily.     carvedilol (COREG) 3.125 MG tablet TAKE 1 TABLET (3.125 MG TOTAL) BY MOUTH 2 (TWO) TIMES DAILY. 180 tablet 3   clopidogrel (PLAVIX) 75 MG tablet TAKE 1 TABLET BY MOUTH EVERY DAY 90 tablet 2   ezetimibe (ZETIA) 10 MG tablet TAKE 1 TABLET (10 MG TOTAL) BY MOUTH DAILY. 90 tablet 1   gabapentin (NEURONTIN) 100 MG capsule Take 1 capsule (100 mg total) by mouth 2 (two) times daily. 180 capsule 3   losartan (COZAAR) 25 MG tablet TAKE 1/2 TABLET BY MOUTH EVERY DAY 45 tablet 0   mirtazapine (REMERON) 15 MG tablet TAKE 1 TABLET BY MOUTH EVERYDAY AT BEDTIME 90 tablet 3   Multiple Vitamins-Minerals (MULTIVITAMIN WITH MINERALS) tablet Take 1 tablet by mouth daily.     nitroGLYCERIN (NITROSTAT) 0.4 MG SL tablet PLACE 1 TABLET UNDER THE TONGUE EVERY 5 MINUTES AS NEEDED FOR CHEST PAIN. 25 tablet 2   pantoprazole (PROTONIX) 40 MG tablet Take 1 tablet (40 mg total) by mouth 2 (two) times daily. 180 tablet 3   ranolazine (RANEXA) 500 MG 12 hr tablet Take 1 tablet (500 mg total) by mouth 2 (two) times daily. 180 tablet 3   rosuvastatin (CRESTOR) 40 MG tablet TAKE 1 TABLET BY MOUTH EVERY DAY 90 tablet 3   tiZANidine (ZANAFLEX) 4 MG tablet Take 1 tablet (4 mg total) by mouth at bedtime as needed for muscle  spasms. 90 tablet 1   No current facility-administered medications on file prior to visit.    Allergies  Allergen Reactions   Metoprolol Tartrate Hives and Itching   Quinolones Other (See Comments)    Contraindicated with AAA   Metoclopramide Nausea Only    Other reaction(s): Unknown    Past Medical History:  Diagnosis Date   AAA (abdominal aortic aneurysm) ([place])    a. Duplex 01/2012: stable infrarenal saccular AAA at 3.3cm x 3.2xm (f/u recommended 01/2013 per Dr. Rockey Situ)   Alcohol abuse    CAD (coronary artery disease)    a. 1995 s/p CABG x 4: LIMA->LAD, VG->RI (known to be occluded), VG->AM->PDA;  b. 05/2002 Inf STEMI: VG->AM->PDA 100% treated w/ 2 BMS complicated by acute thrombosis req 3 BMS;  c. 07/2003 DES to  LAD & LCX (VG's to PDA & RI 100%);  d. 12/2010 Acute MI ([place]): DES to LCX & LM , LIMA ok,;  e.12/2011 Cath: LM/LCX stents ok , LIMA patent.   Cardiomyopathy ([facility] [facility])    a. EF 35% by cath 2015.   Diverticulitis    1/06 Diverticulitis--CT of pelvis--diffuse sigmoid divertic   Dyspnea    GERD (gastroesophageal reflux disease)    Hyperlipidemia    Hypertension    Myocardial infarction ([place])  PAD (peripheral artery disease) (HCC)    a. external iliac and mesenteric stenosis noted by noninvasive imaging.   Renal artery stenosis ([place])    a. 03/2011 PTA and stenting of L RA. b. last duplex 2016 with stable 1-59% bilateral RAS, incidental >50% R EIA stenosis    Past Surgical History:  Procedure Laterality Date   [place]     8/09  Cath--vein graft occlusions which are old--no acute changes   CARDIAC CATHETERIZATION  11/13/2010   stent x 2 @ [place]   CARDIAC CATHETERIZATION  04/05/2014   stent placement    CARDIAC CATHETERIZATION  10/28/2017   CLOSED REDUCTION SHOULDER DISLOCATION     COLONOSCOPY WITH PROPOFOL N/A 12/22/2018   Procedure: COLONOSCOPY WITH PROPOFOL;  Surgeon: Jonathon Bellows, MD;  Location: [facility] ENDOSCOPY;  Service:  Gastroenterology;  Laterality: N/A;   CORONARY ANGIOPLASTY  04/05/2014   stent placement OM 1   CORONARY ARTERY BYPASS GRAFT     CORONARY STENT PLACEMENT  7/12   2 stents--Promus element plus (everolimus eluting)--[facility] in [place] WIRE/FFR STUDY N/A 05/13/2021   Procedure: INTRAVASCULAR PRESSURE WIRE/FFR STUDY;  Surgeon: Early Osmond, MD;  Location: [place] CV LAB;  Service: Cardiovascular;  Laterality: N/A;   LEFT HEART CATH AND CORONARY ANGIOGRAPHY N/A 05/13/2021   Procedure: LEFT HEART CATH AND CORONARY ANGIOGRAPHY;  Surgeon: Early Osmond, MD;  Location: [place] CV LAB;  Service: Cardiovascular;  Laterality: N/A;   LEFT HEART CATH AND CORS/GRAFTS ANGIOGRAPHY N/A 10/28/2017   Procedure: LEFT HEART CATH AND CORS/GRAFTS ANGIOGRAPHY;  Surgeon: Leonie Man, MD;  Location: [place] CV LAB;  Service: Cardiovascular;  Laterality: N/A;   LEFT HEART CATHETERIZATION WITH CORONARY/GRAFT ANGIOGRAM N/A 01/04/2012   Procedure: LEFT HEART CATHETERIZATION WITH Beatrix Fetters;  Surgeon: Burnell Blanks, MD;  Location: [facility] CATH LAB;  Service: Cardiovascular;  Laterality: N/A;   LEFT HEART CATHETERIZATION WITH CORONARY/GRAFT ANGIOGRAM N/A 04/05/2014   Procedure: LEFT HEART CATHETERIZATION WITH Beatrix Fetters;  Surgeon: Jettie Booze, MD;  Location: [facility] CATH LAB;  Service: Cardiovascular;  Laterality: N/A;   PERCUTANEOUS CORONARY STENT INTERVENTION (PCI-S)  04/05/2014   Procedure: PERCUTANEOUS CORONARY STENT INTERVENTION (PCI-S);  Surgeon: Jettie Booze, MD;  Location: [facility] CATH LAB;  Service: Cardiovascular;;  OM1   RENAL ANGIOGRAM N/A 04/16/2011   Procedure: RENAL ANGIOGRAM;  Surgeon: Burnell Blanks, MD;  Location: [facility] CATH LAB;  Service: Cardiovascular;  Laterality: N/A;   RENAL ARTERY STENT  03/2011 ?    Family History  Problem Relation Age of Onset   Coronary artery disease Mother        Died MI age 14    Hypertension Mother    Heart attack Mother    Coronary artery disease Sister        Living   Coronary artery disease Brother        Living   Coronary artery disease Father        Died MI age 71   Heart attack Father    Heart attack Maternal Grandfather    Diabetes Neg Hx    Cancer Neg Hx        prostate or colon    Social History   Socioeconomic History   Marital status: Married    Spouse name: Not on file   Number of children: 3   Years of education: Not on file   Highest education level: Not on file  Occupational History   Occupation: Geneticist, molecular (Animal nutritionist)    Comment: Retired  Tobacco Use   Smoking status: Former    Packs/day: 3.00    Years: 25.00    Total pack years: 75.00    Types: Cigarettes    Quit date: 05/28/1993    Years since quitting: 28.5    Passive exposure: Past   Smokeless tobacco: Never  Vaping Use   Vaping Use: Never used  Substance and Sexual Activity   Alcohol use: Yes    Alcohol/week: 42.0 standard drinks of alcohol    Types: 42 Cans of beer per week   Drug use: No   Sexual activity: Not on file  Other Topics Concern   Not on file  Social History Narrative   No living will   No health care POA but requests wife--then children   Would accept resuscitation   Not sure about tube feeds   Social Determinants of Health   Financial Resource Strain: Not on file  Food Insecurity: Not on file  Transportation Needs: Not on file  Physical Activity: Not on file  Stress: Not on file  Social Connections: Not on file  Intimate Partner Violence: Not on file   Review of Systems BP still low at night---just before going to bed No dizziness or other symptoms     Objective:   Physical Exam Constitutional:      Appearance: Normal appearance.  Cardiovascular:     Comments: Feet warm---but no palpable pulses Musculoskeletal:     Comments: Hands are warm Faint ulnar pulse on left, faint radial pulse on right  Neurological:      Mental Status: He is alert.            Assessment & Plan:

## 2021-12-18 ENCOUNTER — Ambulatory Visit (INDEPENDENT_AMBULATORY_CARE_PROVIDER_SITE_OTHER): Payer: Medicare Other | Admitting: Vascular Surgery

## 2021-12-18 ENCOUNTER — Encounter (INDEPENDENT_AMBULATORY_CARE_PROVIDER_SITE_OTHER): Payer: Self-pay

## 2021-12-18 ENCOUNTER — Ambulatory Visit (INDEPENDENT_AMBULATORY_CARE_PROVIDER_SITE_OTHER): Payer: Medicare Other

## 2021-12-18 DIAGNOSIS — I714 Abdominal aortic aneurysm, without rupture, unspecified: Secondary | ICD-10-CM

## 2021-12-18 LAB — PROTEIN ELECTROPHORESIS, SERUM, WITH REFLEX
Albumin ELP: 3.9 g/dL (ref 3.8–4.8)
Alpha 1: 0.3 g/dL (ref 0.2–0.3)
Alpha 2: 0.8 g/dL (ref 0.5–0.9)
Beta 2: 0.3 g/dL (ref 0.2–0.5)
Beta Globulin: 0.5 g/dL (ref 0.4–0.6)
Gamma Globulin: 0.9 g/dL (ref 0.8–1.7)
Total Protein: 6.7 g/dL (ref 6.1–8.1)

## 2021-12-31 NOTE — Progress Notes (Signed)
Date:  01/01/2022   ID:  [name], DOB [birth year], MRN [id number]  Patient Location:  [address] [place] [id number]   Provider location:   [facility], [place] office  PCP:  Venia Carbon, MD  Cardiologist:  Arvid Right [facility]   Chief Complaint  Patient presents with   6 month follow up     Patient c/o decreased BP in the evenings, shortness of breath with walking a short distance & has leg weakness most days. Medications reviewed by the patient verbally.     History of Present Illness:    [name] is a 71 y.o. male  past medical history of coronary artery disease, bypass surgery in 1995,  stenting at [facility] in February 2004 for MI with a 3.0 x 25 mm and 4.5 x 16 mm Monorail ( location uncertain), also 4.5 x 18 mm stent placed to the mid RCA, followup with repeat stenting in April 2005 to the proximal LAD with a Taxus stent 2.5 x 8 mm, and stenting to the proximal left circumflex with a 2.5 x 12 mm Taxus, with  stent placed November 13, 2010 with a 4.0 x 9 mm stent placed to the left main. renal artery stent.  smoker though stopped in 1995. Prior to that he smoked 3 packs per day. Smoked for approximately 25-28 years, 3 ppd, quit 1995 4.2 cm AAA on CT scan 10/2019 He presents for routine followup of his coronary artery disease.  Last seen in clinic March 2023 Low Bp in the evening, once a week, "from muscle relaxer medication?" Reports he is staying hydrated Sometimes 90 systolic in the evening  Leg weakness, chronic issue Followed by vascular Known LE arterial disease on left, moderate To have scheduled repeat ABIs with his AAA study  No chest pain concerning for angina No regular walking or exercise program Reports legs are weak, unable to do his regular leg exercise program at home  Labs reviewed A1C 6.8, old lab Cholesterol at goal  EKG personally reviewed by myself on todays visit Nsr rate 63  bpm, consider old anterior MI , pvcs  Other past medical hx reviewed NSTEMI: 04/2021, hospital records reviewed Cath: High-grade proximal LAD lesion which perfuses a large septal; the LIMA to the LAD does not backfill just septal.  Failed PCI due to inability to cross and likely represents a chronic total occlusion. 2.  Moderate in-stent restenosis of proximal left circumflex with RFR of 0.92; PCI was deferred. 3.  Sequential vein graft to OM to PDA, vein graft to OM,, and native right coronary artery were not imaged as they are known occluded. Aggressive medical management was recommended  Echo reviewed  Left ventricular ejection fraction, by estimation, is 45 to 50%  Carotid ultrasound October 2021 with 40 to 59% disease/stenosis bilaterally,  Cardiac cath 10/25/2017 No stents Known Severe Multi-vessel CAD: known CTO of ostRCA, mLAD & SVG-AM-PDA, SVG-OM. Widely patent LIMA-LAD - retograde fills to 100% CTO, antegrade flow brisk to the Apex. Prox LM ~40% (stable lesion just prior to the stent) followed by widely patent stents running from pLM-ost-proxCx-OM with brisk flow in both the distal OM & AV Groove Cx --> collaterals to RPL system. Mldly reduced LVEF with basal-mid Inferior Akinesis. Normal/Low LVEDP.    Past Medical History:  Diagnosis Date   AAA (abdominal aortic aneurysm) ([place])    a. Duplex 01/2012: stable infrarenal saccular AAA at 3.3cm x 3.2xm (  f/u recommended 01/2013 per Dr. Rockey Situ)   Alcohol abuse    CAD (coronary artery disease)    a. 1995 s/p CABG x 4: LIMA->LAD, VG->RI (known to be occluded), VG->AM->PDA;  b. 05/2002 Inf STEMI: VG->AM->PDA 100% treated w/ 2 BMS complicated by acute thrombosis req 3 BMS;  c. 07/2003 DES to  LAD & LCX (VG's to PDA & RI 100%);  d. 12/2010 Acute MI ([place]): DES to LCX & LM , LIMA ok,;  e.12/2011 Cath: LM/LCX stents ok , LIMA patent.   Cardiomyopathy ([place])    a. EF 35% by cath 2015.   Diverticulitis    1/06 Diverticulitis--CT of pelvis--diffuse  sigmoid divertic   Dyspnea    GERD (gastroesophageal reflux disease)    Hyperlipidemia    Hypertension    Myocardial infarction [facility])    PAD (peripheral artery disease) ([place])    a. external iliac and mesenteric stenosis noted by noninvasive imaging.   Renal artery stenosis ([place])    a. 03/2011 PTA and stenting of L RA. b. last duplex 2016 with stable 1-59% bilateral RAS, incidental >50% R EIA stenosis   Past Surgical History:  Procedure Laterality Date   [place]     8/09  Cath--vein graft occlusions which are old--no acute changes   CARDIAC CATHETERIZATION  11/13/2010   stent x 2 @ [place]   CARDIAC CATHETERIZATION  04/05/2014   stent placement    CARDIAC CATHETERIZATION  10/28/2017   CLOSED REDUCTION SHOULDER DISLOCATION     COLONOSCOPY WITH PROPOFOL N/A 12/22/2018   Procedure: COLONOSCOPY WITH PROPOFOL;  Surgeon: Jonathon Bellows, MD;  Location: [facility] ENDOSCOPY;  Service: Gastroenterology;  Laterality: N/A;   CORONARY ANGIOPLASTY  04/05/2014   stent placement OM 1   CORONARY ARTERY BYPASS GRAFT     CORONARY STENT PLACEMENT  7/12   2 stents--Promus element plus (everolimus eluting)--[facility] in [place] WIRE/FFR STUDY N/A 05/13/2021   Procedure: INTRAVASCULAR PRESSURE WIRE/FFR STUDY;  Surgeon: Early Osmond, MD;  Location: [place] CV LAB;  Service: Cardiovascular;  Laterality: N/A;   LEFT HEART CATH AND CORONARY ANGIOGRAPHY N/A 05/13/2021   Procedure: LEFT HEART CATH AND CORONARY ANGIOGRAPHY;  Surgeon: Early Osmond, MD;  Location: [place] CV LAB;  Service: Cardiovascular;  Laterality: N/A;   LEFT HEART CATH AND CORS/GRAFTS ANGIOGRAPHY N/A 10/28/2017   Procedure: LEFT HEART CATH AND CORS/GRAFTS ANGIOGRAPHY;  Surgeon: Leonie Man, MD;  Location: [place] CV LAB;  Service: Cardiovascular;  Laterality: N/A;   LEFT HEART CATHETERIZATION WITH CORONARY/GRAFT ANGIOGRAM N/A 01/04/2012   Procedure: LEFT HEART  CATHETERIZATION WITH Beatrix Fetters;  Surgeon: Burnell Blanks, MD;  Location: [facility] CATH LAB;  Service: Cardiovascular;  Laterality: N/A;   LEFT HEART CATHETERIZATION WITH CORONARY/GRAFT ANGIOGRAM N/A 04/05/2014   Procedure: LEFT HEART CATHETERIZATION WITH Beatrix Fetters;  Surgeon: Jettie Booze, MD;  Location: [facility] CATH LAB;  Service: Cardiovascular;  Laterality: N/A;   PERCUTANEOUS CORONARY STENT INTERVENTION (PCI-S)  04/05/2014   Procedure: PERCUTANEOUS CORONARY STENT INTERVENTION (PCI-S);  Surgeon: Jettie Booze, MD;  Location: [facility] CATH LAB;  Service: Cardiovascular;;  OM1   RENAL ANGIOGRAM N/A 04/16/2011   Procedure: RENAL ANGIOGRAM;  Surgeon: Burnell Blanks, MD;  Location: [facility] CATH LAB;  Service: Cardiovascular;  Laterality: N/A;   RENAL ARTERY STENT  03/2011 ?     Allergies:   Metoprolol tartrate, Quinolones, and Metoclopramide   Social History   Tobacco Use   Smoking  status: Former    Packs/day: 3.00    Years: 25.00    Total pack years: 75.00    Types: Cigarettes    Quit date: 05/28/1993    Years since quitting: 28.6    Passive exposure: Past   Smokeless tobacco: Never  Vaping Use   Vaping Use: Never used  Substance Use Topics   Alcohol use: Yes    Alcohol/week: 42.0 standard drinks of alcohol    Types: 42 Cans of beer per week   Drug use: No     Current Outpatient Medications on File Prior to Visit  Medication Sig Dispense Refill   aspirin 81 MG tablet Take 81 mg by mouth daily.     B Complex Vitamins (VITAMIN B COMPLEX PO) Take 1,000 mcg by mouth daily.     BLACK ELDERBERRY PO Take 1 each by mouth daily.     carvedilol (COREG) 3.125 MG tablet TAKE 1 TABLET (3.125 MG TOTAL) BY MOUTH 2 (TWO) TIMES DAILY. 180 tablet 3   clopidogrel (PLAVIX) 75 MG tablet TAKE 1 TABLET BY MOUTH EVERY DAY 90 tablet 2   ezetimibe (ZETIA) 10 MG tablet TAKE 1 TABLET (10 MG TOTAL) BY MOUTH DAILY. 90 tablet 1   gabapentin (NEURONTIN) 100 MG capsule Take 1  capsule (100 mg total) by mouth 2 (two) times daily. 180 capsule 3   losartan (COZAAR) 25 MG tablet TAKE 1/2 TABLET BY MOUTH EVERY DAY 45 tablet 0   mirtazapine (REMERON) 15 MG tablet TAKE 1 TABLET BY MOUTH EVERYDAY AT BEDTIME 90 tablet 3   Multiple Vitamins-Minerals (MULTIVITAMIN WITH MINERALS) tablet Take 1 tablet by mouth daily.     nitroGLYCERIN (NITROSTAT) 0.4 MG SL tablet PLACE 1 TABLET UNDER THE TONGUE EVERY 5 MINUTES AS NEEDED FOR CHEST PAIN. 25 tablet 2   pantoprazole (PROTONIX) 40 MG tablet Take 1 tablet (40 mg total) by mouth 2 (two) times daily. 180 tablet 3   ranolazine (RANEXA) 500 MG 12 hr tablet Take 1 tablet (500 mg total) by mouth 2 (two) times daily. 180 tablet 3   rosuvastatin (CRESTOR) 40 MG tablet TAKE 1 TABLET BY MOUTH EVERY DAY 90 tablet 3   tiZANidine (ZANAFLEX) 4 MG tablet Take 1 tablet (4 mg total) by mouth at bedtime as needed for muscle spasms. 90 tablet 1   No current facility-administered medications on file prior to visit.     Family Hx: The patient's family history includes Coronary artery disease in his brother, father, mother, and sister; Heart attack in his father, maternal grandfather, and mother; Hypertension in his mother. There is no history of Diabetes or Cancer.  ROS:   Please see the history of present illness.    Review of Systems  Constitutional: Negative.   HENT: Negative.    Respiratory: Negative.    Cardiovascular: Negative.   Gastrointestinal: Negative.   Musculoskeletal: Negative.   Neurological: Negative.   Psychiatric/Behavioral: Negative.    All other systems reviewed and are negative.    Labs/Other Tests and Data Reviewed:    Recent Labs: 05/14/2021: Magnesium 1.9 12/15/2021: ALT 8; BUN 9; Creatinine, Ser 0.86; Hemoglobin 11.0; Platelets 307.0; Potassium 4.2; Sodium 133; TSH 2.73   Recent Lipid Panel Lab Results  Component Value Date/Time   CHOL 136 12/15/2021 08:19 AM   CHOL 163 08/11/2015 09:41 AM   TRIG 172.0 (H)  12/15/2021 08:19 AM   HDL 52.20 12/15/2021 08:19 AM   HDL 46 08/11/2015 09:41 AM   CHOLHDL 3 12/15/2021 08:19 AM  [place] 50 12/15/2021 08:19 AM   LDLCALC 63 01/07/2017 11:20 AM   LDLDIRECT 65.0 02/20/2021 10:27 AM    Wt Readings from Last 3 Encounters:  01/01/22 166 lb 8 oz (75.5 kg)  12/15/21 166 lb (75.3 kg)  09/01/21 159 lb (72.1 kg)     Exam:    Vital Signs: Vital signs may also be detailed in the HPI BP 130/72 (BP Location: Left Arm, Patient Position: Sitting, Cuff Size: Normal)   Pulse 63   Ht '5\' 3"'$  (1.6 m)   Wt 166 lb 8 oz (75.5 kg)   SpO2 98%   BMI 29.49 kg/m   Constitutional:  oriented to person, place, and time. No distress.  HENT:  Head: Grossly normal Eyes:  no discharge. No scleral icterus.  Neck: No JVD, no carotid bruits  Cardiovascular: Regular rate and rhythm, no murmurs appreciated Pulmonary/Chest: Clear to auscultation bilaterally, no wheezes or rails Abdominal: Soft.  no distension.  no tenderness.  Musculoskeletal: Normal range of motion Neurological:  normal muscle tone. Coordination normal. No atrophy Skin: Skin warm and dry Psychiatric: normal affect, pleasant  ASSESSMENT & PLAN:    Problem List Items Addressed This Visit       Cardiology Problems   Coronary artery disease involving native coronary artery of native heart with angina pectoris ([place]) - Primary   Relevant Orders   EKG 12-Lead   Heart failure, diastolic, chronic (HCC)   Relevant Orders   EKG 12-Lead   AAA (abdominal aortic aneurysm) without rupture (HCC)   Essential hypertension   Relevant Orders   EKG 12-Lead   Aortic atherosclerosis (HCC)   Renal artery stenosis (HCC)   CAD (coronary artery disease) of artery bypass graft   Other Visit Diagnoses     Chronic dyspnea       HFrEF (heart failure with reduced ejection fraction) (HCC)       Ischemic cardiomyopathy         CAD Prior non-STEMI, cardiac catheterization reviewed  Currently with no symptoms of angina. No  further workup at this time. Continue current medication regimen.  PAD: AAA measuring 4.5 to 4.77 Closely monitored by vascular  Renal artery stenosis: prior stent on left 40 to 59% disease/stenosis bilaterally  ETOH Cessation recommended  Essential hypertension Blood pressure well controlled, reports low pressures in the evening at times, denies dehydration Recommend he check blood pressure in the evenings before he takes his carvedilol.  May need to hold the p.m. carvedilol for low systolic pressure  Hyperlipidemia Cholesterol is at goal on the current lipid regimen. No changes to the medications were made.  SOB with exertion Likely component of COPD Long smoking hx, 3 ppd, quit 1995 Legs weak, not exercising  Back pain Chronic issue, stable at this time   Total encounter time more than 30 minutes  Greater than 50% was spent in counseling and coordination of care with the patient   Signed, Ida Rogue, [place]  Office [address], [place],  [address]

## 2022-01-01 ENCOUNTER — Encounter: Payer: Self-pay | Admitting: Cardiovascular Disease

## 2022-01-01 ENCOUNTER — Ambulatory Visit: Payer: Medicare Other | Attending: Cardiovascular Disease | Admitting: Cardiovascular Disease

## 2022-01-01 VITALS — BP 130/72 | HR 63 | Ht 63.0 in | Wt 166.5 lb

## 2022-01-01 DIAGNOSIS — I25119 Atherosclerotic heart disease of native coronary artery with unspecified angina pectoris: Secondary | ICD-10-CM

## 2022-01-01 DIAGNOSIS — I714 Abdominal aortic aneurysm, without rupture, unspecified: Secondary | ICD-10-CM

## 2022-01-01 DIAGNOSIS — I25708 Atherosclerosis of coronary artery bypass graft(s), unspecified, with other forms of angina pectoris: Secondary | ICD-10-CM

## 2022-01-01 DIAGNOSIS — I701 Atherosclerosis of renal artery: Secondary | ICD-10-CM

## 2022-01-01 DIAGNOSIS — I700 Atherosclerosis of aorta: Secondary | ICD-10-CM

## 2022-01-01 DIAGNOSIS — I255 Ischemic cardiomyopathy: Secondary | ICD-10-CM | POA: Diagnosis not present

## 2022-01-01 DIAGNOSIS — R0609 Other forms of dyspnea: Secondary | ICD-10-CM

## 2022-01-01 DIAGNOSIS — I5020 Unspecified systolic (congestive) heart failure: Secondary | ICD-10-CM | POA: Diagnosis not present

## 2022-01-01 DIAGNOSIS — I10 Essential (primary) hypertension: Secondary | ICD-10-CM | POA: Diagnosis not present

## 2022-01-01 DIAGNOSIS — I5032 Chronic diastolic (congestive) heart failure: Secondary | ICD-10-CM | POA: Diagnosis not present

## 2022-01-01 MED ORDER — RANOLAZINE ER 500 MG PO TB12: 500.0000 mg | ORAL_TABLET | Freq: Two times a day (BID) | ORAL | 3 refills | Status: DC

## 2022-01-01 NOTE — Patient Instructions (Addendum)
Medication Instructions:  No changes  Paper script for ranexa for good RX  If you need a refill on your cardiac medications before your next appointment, please call your pharmacy.   Lab work: No new labs needed  Testing/Procedures: No new testing needed  Follow-Up: At [facility], you and your health needs are our priority.  As part of our continuing mission to provide you with exceptional heart care, we have created designated Provider Care Teams.  These Care Teams include your primary Cardiologist (physician) and Advanced Practice Providers (APPs -  Physician Assistants and Nurse Practitioners) who all work together to provide you with the care you need, when you need it.  You will need a follow up appointment in 6 months  Providers on your designated Care Team:   Murray Hodgkins, NP Christell Faith, PA-C Cadence Kathlen Mody, [place]  COVID-19 Vaccine Information can be found at: [email or web address] For questions related to vaccine distribution or appointments, please email [email or web address] or call [phone].

## 2022-01-24 ENCOUNTER — Other Ambulatory Visit: Payer: Self-pay | Admitting: Cardiovascular Disease

## 2022-01-24 ENCOUNTER — Encounter (HOSPITAL_COMMUNITY): Payer: Self-pay

## 2022-01-24 ENCOUNTER — Other Ambulatory Visit: Payer: Self-pay

## 2022-01-24 ENCOUNTER — Emergency Department (HOSPITAL_COMMUNITY): Payer: Medicare Other

## 2022-01-24 ENCOUNTER — Inpatient Hospital Stay (HOSPITAL_COMMUNITY)
Admission: EM | Admit: 2022-01-24 | Discharge: 2022-01-26 | DRG: 312 | Disposition: A | Discharge status: Home / Self Care | Payer: Medicare Other | Attending: Internal Medicine | Admitting: Internal Medicine

## 2022-01-24 DIAGNOSIS — R42 Dizziness and giddiness: Secondary | ICD-10-CM | POA: Diagnosis not present

## 2022-01-24 DIAGNOSIS — Z951 Presence of aortocoronary bypass graft: Secondary | ICD-10-CM

## 2022-01-24 DIAGNOSIS — I959 Hypotension, unspecified: Secondary | ICD-10-CM | POA: Diagnosis not present

## 2022-01-24 DIAGNOSIS — D649 Anemia, unspecified: Secondary | ICD-10-CM | POA: Diagnosis present

## 2022-01-24 DIAGNOSIS — I951 Orthostatic hypotension: Principal | ICD-10-CM | POA: Diagnosis present

## 2022-01-24 DIAGNOSIS — E2740 Unspecified adrenocortical insufficiency: Secondary | ICD-10-CM | POA: Diagnosis present

## 2022-01-24 DIAGNOSIS — I739 Peripheral vascular disease, unspecified: Secondary | ICD-10-CM | POA: Diagnosis present

## 2022-01-24 DIAGNOSIS — E876 Hypokalemia: Secondary | ICD-10-CM

## 2022-01-24 DIAGNOSIS — Z7982 Long term (current) use of aspirin: Secondary | ICD-10-CM

## 2022-01-24 DIAGNOSIS — G9389 Other specified disorders of brain: Secondary | ICD-10-CM | POA: Diagnosis not present

## 2022-01-24 DIAGNOSIS — R531 Weakness: Secondary | ICD-10-CM | POA: Diagnosis not present

## 2022-01-24 DIAGNOSIS — M4856XA Collapsed vertebra, not elsewhere classified, lumbar region, initial encounter for fracture: Secondary | ICD-10-CM | POA: Diagnosis not present

## 2022-01-24 DIAGNOSIS — K5730 Diverticulosis of large intestine without perforation or abscess without bleeding: Secondary | ICD-10-CM | POA: Diagnosis not present

## 2022-01-24 DIAGNOSIS — E860 Dehydration: Secondary | ICD-10-CM | POA: Diagnosis present

## 2022-01-24 DIAGNOSIS — I5022 Chronic systolic (congestive) heart failure: Secondary | ICD-10-CM | POA: Diagnosis present

## 2022-01-24 DIAGNOSIS — E871 Hypo-osmolality and hyponatremia: Secondary | ICD-10-CM

## 2022-01-24 DIAGNOSIS — E861 Hypovolemia: Secondary | ICD-10-CM | POA: Diagnosis present

## 2022-01-24 DIAGNOSIS — I25810 Atherosclerosis of coronary artery bypass graft(s) without angina pectoris: Secondary | ICD-10-CM | POA: Diagnosis not present

## 2022-01-24 DIAGNOSIS — R29818 Other symptoms and signs involving the nervous system: Secondary | ICD-10-CM | POA: Diagnosis not present

## 2022-01-24 DIAGNOSIS — Z888 Allergy status to other drugs, medicaments and biological substances status: Secondary | ICD-10-CM

## 2022-01-24 DIAGNOSIS — E785 Hyperlipidemia, unspecified: Secondary | ICD-10-CM

## 2022-01-24 DIAGNOSIS — I712 Thoracic aortic aneurysm, without rupture, unspecified: Secondary | ICD-10-CM | POA: Diagnosis present

## 2022-01-24 DIAGNOSIS — Z881 Allergy status to other antibiotic agents status: Secondary | ICD-10-CM

## 2022-01-24 DIAGNOSIS — I429 Cardiomyopathy, unspecified: Secondary | ICD-10-CM | POA: Diagnosis present

## 2022-01-24 DIAGNOSIS — Z7902 Long term (current) use of antithrombotics/antiplatelets: Secondary | ICD-10-CM

## 2022-01-24 DIAGNOSIS — I252 Old myocardial infarction: Secondary | ICD-10-CM

## 2022-01-24 DIAGNOSIS — R9431 Abnormal electrocardiogram [ECG] [EKG]: Secondary | ICD-10-CM | POA: Diagnosis present

## 2022-01-24 DIAGNOSIS — I2510 Atherosclerotic heart disease of native coronary artery without angina pectoris: Secondary | ICD-10-CM

## 2022-01-24 DIAGNOSIS — K551 Chronic vascular disorders of intestine: Secondary | ICD-10-CM | POA: Diagnosis not present

## 2022-01-24 DIAGNOSIS — Z79899 Other long term (current) drug therapy: Secondary | ICD-10-CM

## 2022-01-24 DIAGNOSIS — Z8249 Family history of ischemic heart disease and other diseases of the circulatory system: Secondary | ICD-10-CM

## 2022-01-24 DIAGNOSIS — I6523 Occlusion and stenosis of bilateral carotid arteries: Secondary | ICD-10-CM | POA: Diagnosis not present

## 2022-01-24 DIAGNOSIS — I701 Atherosclerosis of renal artery: Secondary | ICD-10-CM | POA: Diagnosis present

## 2022-01-24 DIAGNOSIS — Z87891 Personal history of nicotine dependence: Secondary | ICD-10-CM

## 2022-01-24 DIAGNOSIS — J9811 Atelectasis: Secondary | ICD-10-CM | POA: Diagnosis not present

## 2022-01-24 DIAGNOSIS — R001 Bradycardia, unspecified: Secondary | ICD-10-CM | POA: Diagnosis not present

## 2022-01-24 DIAGNOSIS — K219 Gastro-esophageal reflux disease without esophagitis: Secondary | ICD-10-CM | POA: Diagnosis present

## 2022-01-24 DIAGNOSIS — F1010 Alcohol abuse, uncomplicated: Secondary | ICD-10-CM | POA: Diagnosis present

## 2022-01-24 DIAGNOSIS — E538 Deficiency of other specified B group vitamins: Secondary | ICD-10-CM | POA: Diagnosis present

## 2022-01-24 DIAGNOSIS — I714 Abdominal aortic aneurysm, without rupture, unspecified: Secondary | ICD-10-CM | POA: Diagnosis not present

## 2022-01-24 DIAGNOSIS — I10 Essential (primary) hypertension: Secondary | ICD-10-CM | POA: Diagnosis present

## 2022-01-24 DIAGNOSIS — K8020 Calculus of gallbladder without cholecystitis without obstruction: Secondary | ICD-10-CM | POA: Diagnosis not present

## 2022-01-24 DIAGNOSIS — I110 Hypertensive heart disease with heart failure: Secondary | ICD-10-CM | POA: Diagnosis present

## 2022-01-24 LAB — I-STAT CHEM 8, ED
BUN: 6 mg/dL — ABNORMAL LOW (ref 8–23)
Calcium, Ion: 1.17 mmol/L (ref 1.15–1.40)
Chloride: 99 mmol/L (ref 98–111)
Creatinine, Ser: 1 mg/dL (ref 0.61–1.24)
Glucose, Bld: 96 mg/dL (ref 70–99)
HCT: 33 % — ABNORMAL LOW (ref 39.0–52.0)
Hemoglobin: 11.2 g/dL — ABNORMAL LOW (ref 13.0–17.0)
Potassium: 3.1 mmol/L — ABNORMAL LOW (ref 3.5–5.1)
Sodium: 133 mmol/L — ABNORMAL LOW (ref 135–145)
TCO2: 22 mmol/L (ref 22–32)

## 2022-01-24 LAB — CBC WITH DIFFERENTIAL/PLATELET
Abs Immature Granulocytes: 0.02 10*3/uL (ref 0.00–0.07)
Basophils Absolute: 0.1 10*3/uL (ref 0.0–0.1)
Basophils Relative: 1 %
Eosinophils Absolute: 0.3 10*3/uL (ref 0.0–0.5)
Eosinophils Relative: 4 %
HCT: 31.3 % — ABNORMAL LOW (ref 39.0–52.0)
Hemoglobin: 10.5 g/dL — ABNORMAL LOW (ref 13.0–17.0)
Immature Granulocytes: 0 %
Lymphocytes Relative: 29 %
Lymphs Abs: 2 10*3/uL (ref 0.7–4.0)
MCH: 26.9 pg (ref 26.0–34.0)
MCHC: 33.5 g/dL (ref 30.0–36.0)
MCV: 80.3 fL (ref 80.0–100.0)
Monocytes Absolute: 0.9 10*3/uL (ref 0.1–1.0)
Monocytes Relative: 13 %
Neutro Abs: 3.6 10*3/uL (ref 1.7–7.7)
Neutrophils Relative %: 53 %
Platelets: 260 10*3/uL (ref 150–400)
RBC: 3.9 MIL/uL — ABNORMAL LOW (ref 4.22–5.81)
RDW: 17.6 % — ABNORMAL HIGH (ref 11.5–15.5)
WBC: 6.8 10*3/uL (ref 4.0–10.5)
nRBC: 0 % (ref 0.0–0.2)

## 2022-01-24 LAB — ABO/RH: ABO/RH(D): O POS

## 2022-01-24 LAB — COMPREHENSIVE METABOLIC PANEL
ALT: 12 U/L (ref 0–44)
AST: 18 U/L (ref 15–41)
Albumin: 3.5 g/dL (ref 3.5–5.0)
Alkaline Phosphatase: 36 U/L — ABNORMAL LOW (ref 38–126)
Anion gap: 11 (ref 5–15)
BUN: 9 mg/dL (ref 8–23)
CO2: 21 mmol/L — ABNORMAL LOW (ref 22–32)
Calcium: 8.9 mg/dL (ref 8.9–10.3)
Chloride: 99 mmol/L (ref 98–111)
Creatinine, Ser: 1.1 mg/dL (ref 0.61–1.24)
GFR, Estimated: >60 mL/min (ref >60)
Glucose, Bld: 103 mg/dL — ABNORMAL HIGH (ref 70–99)
Potassium: 3 mmol/L — ABNORMAL LOW (ref 3.5–5.1)
Sodium: 131 mmol/L — ABNORMAL LOW (ref 135–145)
Total Bilirubin: 0.7 mg/dL (ref 0.3–1.2)
Total Protein: 6.4 g/dL — ABNORMAL LOW (ref 6.5–8.1)

## 2022-01-24 LAB — TYPE AND SCREEN
ABO/RH(D): O POS
Antibody Screen: NEGATIVE

## 2022-01-24 LAB — TROPONIN I (HIGH SENSITIVITY): Troponin I (High Sensitivity): 79 ng/L — ABNORMAL HIGH (ref <18)

## 2022-01-24 LAB — LIPASE, BLOOD: Lipase: 25 U/L (ref 11–51)

## 2022-01-24 MED ORDER — IOHEXOL 350 MG/ML SOLN
100.0000 mL | Freq: Once | INTRAVENOUS | Status: AC | PRN
  Administered 2022-01-24: 100 mL via INTRAVENOUS

## 2022-01-24 MED ORDER — POTASSIUM CHLORIDE CRYS ER 20 MEQ PO TBCR
40.0000 meq | EXTENDED_RELEASE_TABLET | Freq: Once | ORAL | Status: AC
  Administered 2022-01-25: 40 meq via ORAL
  Filled 2022-01-24: qty 2

## 2022-01-24 MED ORDER — SODIUM CHLORIDE 0.9 % IV BOLUS
1000.0000 mL | Freq: Once | INTRAVENOUS | Status: AC
  Administered 2022-01-24: 1000 mL via INTRAVENOUS

## 2022-01-24 NOTE — ED Triage Notes (Signed)
Pt comes by EMS with dizziness and hypotension. Pt's BP initially was 70/43 and after 737m of NS, increased to 110/60. Pt has extensive cardiac history.

## 2022-01-24 NOTE — ED Provider Notes (Signed)
[facility] EMERGENCY DEPARTMENT Provider Note   CSN: [id number] Arrival date & time: 01/24/22  2127     History  Chief Complaint  Patient presents with   Dizziness    [name] is a 71 y.o. male history of hypertension, AAA, CABG, here presenting with dizziness and hypotension.  Patient states that his blood pressure usually runs low in the evening.  He states that usually his blood pressure is around 100-1 10 in the evenings.  Patient recently saw cardiology and his Coreg was decreased.  Patient also has a known AAA.  Patient has been getting ultrasound every 6 months to monitor and the most recent size was 4.7 cm.  Patient states that he felt lightheaded and dizzy today.  He check his blood pressure at home and was in the 50s.  He denies any fevers.  Denies any abdominal pain.  Denies any vomiting.  Denies any rectal bleeding or blood in his stool.  Patient was noted to be hypotensive with blood pressure in the 90s.  Patient received 750 cc prior to arrival by EMS.  The history is provided by the patient.       Home Medications Prior to Admission medications   Medication Sig Start Date End Date Taking? Authorizing Provider  aspirin 81 MG tablet Take 81 mg by mouth daily.    [provider]  B Complex Vitamins (VITAMIN B COMPLEX PO) Take 1,000 mcg by mouth daily.    [provider]  BLACK ELDERBERRY PO Take 1 each by mouth daily.    [provider]  carvedilol (COREG) 3.125 MG tablet TAKE 1 TABLET (3.125 MG TOTAL) BY MOUTH 2 (TWO) TIMES DAILY. 04/29/21   Minna Merritts, MD  clopidogrel (PLAVIX) 75 MG tablet TAKE 1 TABLET BY MOUTH EVERY DAY 09/22/21   Minna Merritts, MD  ezetimibe (ZETIA) 10 MG tablet TAKE 1 TABLET (10 MG TOTAL) BY MOUTH DAILY. 04/13/21   Minna Merritts, MD  gabapentin (NEURONTIN) 100 MG capsule Take 1 capsule (100 mg total) by mouth 2 (two) times daily. 09/01/21   Venia Carbon, MD  losartan (COZAAR) 25 MG  tablet TAKE 1/2 TABLET BY MOUTH EVERY DAY 10/29/21   Minna Merritts, MD  mirtazapine (REMERON) 15 MG tablet TAKE 1 TABLET BY MOUTH EVERYDAY AT BEDTIME 04/06/21   Venia Carbon, MD  Multiple Vitamins-Minerals (MULTIVITAMIN WITH MINERALS) tablet Take 1 tablet by mouth daily.    [provider]  nitroGLYCERIN (NITROSTAT) 0.4 MG SL tablet PLACE 1 TABLET UNDER THE TONGUE EVERY 5 MINUTES AS NEEDED FOR CHEST PAIN. 08/13/21   Venia Carbon, MD  pantoprazole (PROTONIX) 40 MG tablet Take 1 tablet (40 mg total) by mouth 2 (two) times daily. 06/04/21   Venia Carbon, MD  ranolazine (RANEXA) 500 MG 12 hr tablet Take 1 tablet (500 mg total) by mouth 2 (two) times daily. 01/01/22   Minna Merritts, MD  rosuvastatin (CRESTOR) 40 MG tablet TAKE 1 TABLET BY MOUTH EVERY DAY 06/04/21   Minna Merritts, MD  tiZANidine (ZANAFLEX) 4 MG tablet Take 1 tablet (4 mg total) by mouth at bedtime as needed for muscle spasms. 09/01/21   Venia Carbon, MD      Allergies    Metoprolol tartrate, Quinolones, and Metoclopramide    Review of Systems   Review of Systems  Neurological:  Positive for dizziness.  All other systems reviewed and are negative.   Physical Exam Updated Vital  Signs BP 99/62   Pulse (!) 53   Temp 98.4 F (36.9 C) (Oral)   Resp 20   Ht '5\' 3"'$  (1.6 m)   Wt 73 kg   SpO2 99%   BMI 28.52 kg/m  Physical Exam Vitals and nursing note reviewed.  Constitutional:      Comments: Chronically ill   HENT:     Head: Normocephalic.     Nose: Nose normal.     Mouth/Throat:     Mouth: Mucous membranes are dry.  Eyes:     Extraocular Movements: Extraocular movements intact.     Pupils: Pupils are equal, round, and reactive to light.  Cardiovascular:     Rate and Rhythm: Normal rate and regular rhythm.     Pulses: Normal pulses.     Heart sounds: Normal heart sounds.  Pulmonary:     Effort: Pulmonary effort is normal.     Breath sounds: Normal breath sounds.  Abdominal:      Comments: + Pulsatile abdominal mass but abdomen is not tender  Musculoskeletal:     Cervical back: Normal range of motion and neck supple.  Skin:    General: Skin is warm.     Capillary Refill: Capillary refill takes less than 2 seconds.  Neurological:     General: No focal deficit present.     Mental Status: He is alert and oriented to person, place, and time.  Psychiatric:        Mood and Affect: Mood normal.        Behavior: Behavior normal.     ED Results / Procedures / Treatments   Labs (all labs ordered are listed, but only abnormal results are displayed) Labs Reviewed  CBC WITH DIFFERENTIAL/PLATELET  COMPREHENSIVE METABOLIC PANEL  LIPASE, BLOOD  URINALYSIS, ROUTINE W REFLEX MICROSCOPIC  I-STAT CHEM 8, ED  TYPE AND SCREEN  TROPONIN I (HIGH SENSITIVITY)    EKG EKG Interpretation  Date/Time:  Sunday January 24 2022 21:31:31 EDT Ventricular Rate:  53 PR Interval:  174 QRS Duration: 105 QT Interval:  558 QTC Calculation: 524 R Axis:   87 Text Interpretation: Sinus rhythm Borderline right axis deviation Minimal ST depression, lateral leads Prolonged QT interval prolonged QT new since previous Confirmed by Wandra Arthurs [phone]) on 01/24/2022 9:44:12 PM  Radiology No results found.  Procedures Procedures    Medications Ordered in ED Medications  sodium chloride 0.9 % bolus 1,000 mL (has no administration in time range)    ED Course/ Medical Decision Making/ A&P                           Medical Decision Making [name] is a 71 y.o. male here presenting with hypotension.  Patient has a known AAA.  I am concerned for possible ruptured AAA.  Patient has no fever or cough no signs of sepsis.  Plan to get CBC and CMP and dissection study.  11:23 PM Dissection study pending.  Signed out to Dr. Roxanne Mins in the ED to follow-up dissection study and reassess patient.  Amount and/or Complexity of Data Reviewed Labs: ordered. Decision-making details documented in  ED Course. Radiology: ordered and independent interpretation performed. Decision-making details documented in ED Course.  Risk Prescription drug management.    Final Clinical Impression(s) / ED Diagnoses Final diagnoses:  None    Rx / DC Orders ED Discharge Orders     None  Drenda Freeze, MD 01/24/22 [phone]

## 2022-01-24 NOTE — ED Provider Notes (Incomplete)
Care assumed from Dr. Darl Householder, patient with hypotension, which has improved with fluids.  He is pending CT angiogram dissection study. Magnesium level and random cortisol level have been ordered.  CT angiogram shows aortic aneurysm having increased in size, but not at a critical level.  No acute findings.  I have independently viewed the images, and agree with radiologist's interpretation.  Repeat troponin level is declining, no evidence of ACS.  Random cortisol level is low concerning for possible adrenal insufficiency.  On talking with the patient, his hypotension and dizziness only occur in the evening.  Given his degree of hypotension as well as presence of electrolyte abnormalities, I do not feel he is safe for discharge.  Case is discussed with Dr. Sidney Ace of [facility], who agrees to admit the patient.  Results for orders placed or performed during the hospital encounter of 01/24/22  CBC with Differential  Result Value Ref Range   WBC 6.8 4.0 - 10.5 K/uL   RBC 3.90 (L) 4.22 - 5.81 MIL/uL   Hemoglobin 10.5 (L) 13.0 - 17.0 g/dL   HCT 31.3 (L) 39.0 - 52.0 %   MCV 80.3 80.0 - 100.0 fL   MCH 26.9 26.0 - 34.0 pg   MCHC 33.5 30.0 - 36.0 g/dL   RDW 17.6 (H) 11.5 - 15.5 %   Platelets 260 150 - 400 K/uL   nRBC 0.0 0.0 - 0.2 %   Neutrophils Relative % 53 %   Neutro Abs 3.6 1.7 - 7.7 K/uL   Lymphocytes Relative 29 %   Lymphs Abs 2.0 0.7 - 4.0 K/uL   Monocytes Relative 13 %   Monocytes Absolute 0.9 0.1 - 1.0 K/uL   Eosinophils Relative 4 %   Eosinophils Absolute 0.3 0.0 - 0.5 K/uL   Basophils Relative 1 %   Basophils Absolute 0.1 0.0 - 0.1 K/uL   Immature Granulocytes 0 %   Abs Immature Granulocytes 0.02 0.00 - 0.07 K/uL  Comprehensive metabolic panel  Result Value Ref Range   Sodium 131 (L) 135 - 145 mmol/L   Potassium 3.0 (L) 3.5 - 5.1 mmol/L   Chloride 99 98 - 111 mmol/L   CO2 21 (L) 22 - 32 mmol/L   Glucose, Bld 103 (H) 70 - 99 mg/dL   BUN 9 8 - 23 mg/dL   Creatinine, Ser 1.10  0.61 - 1.24 mg/dL   Calcium 8.9 8.9 - 10.3 mg/dL   Total Protein 6.4 (L) 6.5 - 8.1 g/dL   Albumin 3.5 3.5 - 5.0 g/dL   AST 18 15 - 41 U/L   ALT 12 0 - 44 U/L   Alkaline Phosphatase 36 (L) 38 - 126 U/L   Total Bilirubin 0.7 0.3 - 1.2 mg/dL   GFR, Estimated >60 >60 mL/min   Anion gap 11 5 - 15  Lipase, blood  Result Value Ref Range   Lipase 25 11 - 51 U/L  Urinalysis, Routine w reflex microscopic Urine, Clean Catch  Result Value Ref Range   Color, Urine STRAW (A) YELLOW   APPearance CLEAR CLEAR   Specific Gravity, Urine 1.018 1.005 - 1.030   pH 7.0 5.0 - 8.0   Glucose, UA NEGATIVE NEGATIVE mg/dL   Hgb urine dipstick NEGATIVE NEGATIVE   Bilirubin Urine NEGATIVE NEGATIVE   Ketones, ur NEGATIVE NEGATIVE mg/dL   Protein, ur NEGATIVE NEGATIVE mg/dL   Nitrite NEGATIVE NEGATIVE   Leukocytes,Ua NEGATIVE NEGATIVE  Magnesium  Result Value Ref Range   Magnesium 1.7 1.7 - 2.4 mg/dL  Cortisol  Result Value Ref Range   Cortisol, Plasma 2.4 ug/dL  I-stat chem 8, ED (not at [facility] or [facility])  Result Value Ref Range   Sodium 133 (L) 135 - 145 mmol/L   Potassium 3.1 (L) 3.5 - 5.1 mmol/L   Chloride 99 98 - 111 mmol/L   BUN 6 (L) 8 - 23 mg/dL   Creatinine, Ser 1.00 0.61 - 1.24 mg/dL   Glucose, Bld 96 70 - 99 mg/dL   Calcium, Ion 1.17 1.15 - 1.40 mmol/L   TCO2 22 22 - 32 mmol/L   Hemoglobin 11.2 (L) 13.0 - 17.0 g/dL   HCT 33.0 (L) 39.0 - 52.0 %  Type and screen Ordered by PROVIDER DEFAULT  Result Value Ref Range   ABO/RH(D) O POS    Antibody Screen NEG    Sample Expiration      01/27/2022,2359 Performed at [facility], [address], [place], [place] [address]   ABO/Rh  Result Value Ref Range   ABO/RH(D)      O POS Performed at [facility] [address] [place], [place] [address]   Troponin I (High Sensitivity)  Result Value Ref Range   Troponin I (High Sensitivity) 79 (H) <18 ng/L  Troponin I (High Sensitivity)  Result Value Ref Range   Troponin I (High  Sensitivity) 58 (H) <18 ng/L   CT Angio Chest/Abd/Pel for Dissection W and/or Wo Contrast  Result Date: 01/24/2022 CLINICAL DATA:  Acute aortic syndrome (AAS) suspected hx of AAA r/o rupture vs dissection EXAM: CT ANGIOGRAPHY CHEST, ABDOMEN AND PELVIS TECHNIQUE: Non-contrast CT of the chest was initially obtained. Multidetector CT imaging through the chest, abdomen and pelvis was performed using the standard protocol during bolus administration of intravenous contrast. Multiplanar reconstructed images and MIPs were obtained and reviewed to evaluate the vascular anatomy. RADIATION DOSE REDUCTION: This exam was performed according to the departmental dose-optimization program which includes automated exposure control, adjustment of the mA and/or kV according to patient size and/or use of iterative reconstruction technique. CONTRAST:  132m OMNIPAQUE IOHEXOL 350 MG/ML SOLN COMPARISON:  None Available. FINDINGS: CTA CHEST FINDINGS Cardiovascular: Preferential opacification of the thoracic aorta. No evidence of thoracic aortic aneurysm or dissection. Prominent heart size. No significant pericardial effusion. Severe atherosclerotic plaque of the thoracic aorta. Four-vessel coronary artery calcifications status post coronary artery bypass graft. Right coronary artery stent. The main pulmonary artery is normal in caliber. No pulmonary embolus. Mediastinum/Nodes: No enlarged mediastinal, hilar, or axillary lymph nodes. Thyroid gland, trachea, and esophagus demonstrate no significant findings. Lungs/Pleura: Bilateral lower lobe subsegmental atelectasis. No focal consolidation. No pulmonary nodule. No pulmonary mass. No pleural effusion. No pneumothorax. Musculoskeletal: No chest wall abnormality. No suspicious lytic or blastic osseous lesions. No acute displaced fracture. Multilevel degenerative changes of the spine. Review of the MIP images confirms the above findings. CTA ABDOMEN AND PELVIS FINDINGS VASCULAR Aorta:  Severe atherosclerotic plaque. Interval increase in size of a 4.4 x 4.5 cm (from 3.6 cm in 2016) infrarenal abdominal aorta aneurysm extending approximately 5 cm in the craniocaudal dimension down to the aortic bifurcation. Celiac: Replaced left gastric artery with origin off of the aorta a just proximal to the celiac artery origin. Patent without evidence of aneurysm, dissection, vasculitis or significant stenosis. 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 But sick a shin distally is noted. No aneurysm, dissection, vasculitis or significant stenosis. Renals: Both renal arteries are patent without evidence of aneurysm, dissection,  vasculitis, fibromuscular dysplasia or significant stenosis. IMA: Patent without evidence of aneurysm, dissection, vasculitis or significant stenosis. Inflow: Patent without evidence of aneurysm, dissection, vasculitis or significant stenosis. Veins: No obvious venous abnormality within the limitations of this arterial phase study. Review of the MIP images confirms the above findings. NON-VASCULAR Hepatobiliary: No focal liver abnormality. Calcified gallstone noted within the gallbladder lumen. No gallbladder wall thickening or pericholecystic fluid. No biliary dilatation. Pancreas: No focal lesion. Normal pancreatic contour. No surrounding inflammatory changes. No main pancreatic ductal dilatation. Spleen: Normal in size without focal abnormality. Adrenals/Urinary Tract: No adrenal nodule bilaterally. Bilateral kidneys enhance symmetrically. No hydronephrosis. No hydroureter. The urinary bladder is unremarkable. Stomach/Bowel: Stomach is within normal limits. No evidence of bowel wall thickening or dilatation. Colonic diverticulosis. Appendix appears normal. Lymphatic: No lymphadenopathy. Reproductive: Prostate is unremarkable. Other: No intraperitoneal free fluid. No intraperitoneal free gas. No organized fluid collection.  Musculoskeletal: No abdominal wall hernia or abnormality. No suspicious lytic or blastic osseous lesions. No acute displaced fracture. Stable chronic anterior wedge compression fracture of the L1 vertebral body . Intervertebral disc space vacuum phenomenon. Review of the MIP images confirms the above findings. IMPRESSION: 1. Interval increase in size of a 4.4 x 4.5 cm (from 3.6 cm in 2016) infrarenal abdominal aorta aneurysm. Recommend follow-up CT/MR every 6 months and vascular consultation. This recommendation follows ACR consensus guidelines: White Paper of the ACR Incidental Findings Committee II on Vascular Findings. J Am Coll Radiol 2013; 10:789-794. 2. No pulmonary embolus. 3. Aortic Atherosclerosis (ICD10-I70.0) - severe with four-vessel coronary calcification status post coronary artery bypass graft. 4. 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 5. Cholelithiasis with no CT finding of acute cholecystitis. 6. Colonic diverticulosis with no acute diverticulitis. Electronically Signed   By: Iven Finn M.D.   On: 01/24/2022 23:34   CT HEAD WO CONTRAST (5MM)  Result Date: 01/24/2022 CLINICAL DATA:  Neuro deficit, acute, stroke suspected EXAM: CT HEAD WITHOUT CONTRAST TECHNIQUE: Contiguous axial images were obtained from the base of the skull through the vertex without intravenous contrast. RADIATION DOSE REDUCTION: This exam was performed according to the departmental dose-optimization program which includes automated exposure control, adjustment of the mA and/or kV according to patient size and/or use of iterative reconstruction technique. COMPARISON:  None Available. BRAIN: BRAIN Cerebral ventricle sizes are concordant with the degree of cerebral volume loss. Patchy and confluent areas of decreased attenuation are noted throughout the deep and periventricular white matter of the cerebral hemispheres bilaterally, compatible with chronic microvascular  ischemic disease. No evidence of large-territorial acute infarction. No parenchymal hemorrhage. No mass lesion. No extra-axial collection. No mass effect or midline shift. No hydrocephalus. Basilar cisterns are patent. Vascular: No hyperdense vessel. Atherosclerotic calcifications are present within the cavernous internal carotid arteries. Skull: No acute fracture or focal lesion. Sinuses/Orbits: Paranasal sinuses and mastoid air cells are clear. The orbits are unremarkable. Other: None. IMPRESSION: No acute intracranial abnormality. Electronically Signed   By: Iven Finn M.D.   On: 01/24/2022 23:17   DG Chest Port 1 View  Result Date: 01/24/2022 CLINICAL DATA:  Hypotension EXAM: PORTABLE CHEST 1 VIEW COMPARISON:  05/13/2021 FINDINGS: Lungs are clear.  No pleural effusion or pneumothorax. The heart is normal in size. Postsurgical changes related to prior CABG. Median sternotomy. IMPRESSION: No evidence of acute cardiopulmonary disease. Electronically Signed   By: Julian Hy M.D.   On: 02/17/8526 78:24      Delora Fuel, MD 23/53/61 [phone]  Delora Fuel, MD 21/94/71 [phone]

## 2022-01-25 ENCOUNTER — Observation Stay (HOSPITAL_COMMUNITY): Payer: Medicare Other

## 2022-01-25 DIAGNOSIS — D649 Anemia, unspecified: Secondary | ICD-10-CM | POA: Diagnosis present

## 2022-01-25 DIAGNOSIS — I2510 Atherosclerotic heart disease of native coronary artery without angina pectoris: Secondary | ICD-10-CM | POA: Diagnosis present

## 2022-01-25 DIAGNOSIS — R55 Syncope and collapse: Secondary | ICD-10-CM | POA: Diagnosis not present

## 2022-01-25 DIAGNOSIS — E861 Hypovolemia: Secondary | ICD-10-CM | POA: Diagnosis present

## 2022-01-25 DIAGNOSIS — F1010 Alcohol abuse, uncomplicated: Secondary | ICD-10-CM | POA: Diagnosis present

## 2022-01-25 DIAGNOSIS — I252 Old myocardial infarction: Secondary | ICD-10-CM | POA: Diagnosis not present

## 2022-01-25 DIAGNOSIS — K219 Gastro-esophageal reflux disease without esophagitis: Secondary | ICD-10-CM | POA: Diagnosis present

## 2022-01-25 DIAGNOSIS — E2740 Unspecified adrenocortical insufficiency: Secondary | ICD-10-CM | POA: Diagnosis present

## 2022-01-25 DIAGNOSIS — E860 Dehydration: Secondary | ICD-10-CM | POA: Diagnosis present

## 2022-01-25 DIAGNOSIS — Z951 Presence of aortocoronary bypass graft: Secondary | ICD-10-CM | POA: Diagnosis not present

## 2022-01-25 DIAGNOSIS — E871 Hypo-osmolality and hyponatremia: Secondary | ICD-10-CM | POA: Diagnosis not present

## 2022-01-25 DIAGNOSIS — I959 Hypotension, unspecified: Secondary | ICD-10-CM

## 2022-01-25 DIAGNOSIS — Z881 Allergy status to other antibiotic agents status: Secondary | ICD-10-CM | POA: Diagnosis not present

## 2022-01-25 DIAGNOSIS — E876 Hypokalemia: Secondary | ICD-10-CM

## 2022-01-25 DIAGNOSIS — I110 Hypertensive heart disease with heart failure: Secondary | ICD-10-CM | POA: Diagnosis present

## 2022-01-25 DIAGNOSIS — I739 Peripheral vascular disease, unspecified: Secondary | ICD-10-CM | POA: Diagnosis present

## 2022-01-25 DIAGNOSIS — Z87891 Personal history of nicotine dependence: Secondary | ICD-10-CM | POA: Diagnosis not present

## 2022-01-25 DIAGNOSIS — E785 Hyperlipidemia, unspecified: Secondary | ICD-10-CM

## 2022-01-25 DIAGNOSIS — I5022 Chronic systolic (congestive) heart failure: Secondary | ICD-10-CM | POA: Diagnosis present

## 2022-01-25 DIAGNOSIS — I701 Atherosclerosis of renal artery: Secondary | ICD-10-CM | POA: Diagnosis present

## 2022-01-25 DIAGNOSIS — R42 Dizziness and giddiness: Secondary | ICD-10-CM

## 2022-01-25 DIAGNOSIS — Z8249 Family history of ischemic heart disease and other diseases of the circulatory system: Secondary | ICD-10-CM | POA: Diagnosis not present

## 2022-01-25 DIAGNOSIS — E538 Deficiency of other specified B group vitamins: Secondary | ICD-10-CM | POA: Diagnosis present

## 2022-01-25 DIAGNOSIS — R9431 Abnormal electrocardiogram [ECG] [EKG]: Secondary | ICD-10-CM | POA: Diagnosis present

## 2022-01-25 DIAGNOSIS — I951 Orthostatic hypotension: Secondary | ICD-10-CM | POA: Diagnosis present

## 2022-01-25 DIAGNOSIS — I429 Cardiomyopathy, unspecified: Secondary | ICD-10-CM | POA: Diagnosis present

## 2022-01-25 DIAGNOSIS — I712 Thoracic aortic aneurysm, without rupture, unspecified: Secondary | ICD-10-CM | POA: Diagnosis present

## 2022-01-25 LAB — ECHOCARDIOGRAM COMPLETE
AR max vel: 2.73 cm2
AV Area VTI: 2.55 cm2
AV Area mean vel: 2.55 cm2
AV Mean grad: 2 mmHg
AV Peak grad: 3.5 mmHg
Ao pk vel: 0.93 m/s
Area-P 1/2: 4.15 cm2
Calc EF: 36.8 %
Height: 63 in
MV M vel: 3.91 m/s
MV Peak grad: 61.2 mmHg
S' Lateral: 5 cm
Single Plane A2C EF: 41.2 %
Single Plane A4C EF: 31.6 %
Weight: 2576 oz

## 2022-01-25 LAB — CBC
HCT: 28.6 % — ABNORMAL LOW (ref 39.0–52.0)
Hemoglobin: 9.5 g/dL — ABNORMAL LOW (ref 13.0–17.0)
MCH: 27.1 pg (ref 26.0–34.0)
MCHC: 33.2 g/dL (ref 30.0–36.0)
MCV: 81.7 fL (ref 80.0–100.0)
Platelets: 244 10*3/uL (ref 150–400)
RBC: 3.5 MIL/uL — ABNORMAL LOW (ref 4.22–5.81)
RDW: 18 % — ABNORMAL HIGH (ref 11.5–15.5)
WBC: 5 10*3/uL (ref 4.0–10.5)
nRBC: 0 % (ref 0.0–0.2)

## 2022-01-25 LAB — BASIC METABOLIC PANEL
Anion gap: 5 (ref 5–15)
BUN: 6 mg/dL — ABNORMAL LOW (ref 8–23)
CO2: 22 mmol/L (ref 22–32)
Calcium: 8.5 mg/dL — ABNORMAL LOW (ref 8.9–10.3)
Chloride: 108 mmol/L (ref 98–111)
Creatinine, Ser: 1 mg/dL (ref 0.61–1.24)
GFR, Estimated: >60 mL/min (ref >60)
Glucose, Bld: 97 mg/dL (ref 70–99)
Potassium: 4.1 mmol/L (ref 3.5–5.1)
Sodium: 135 mmol/L (ref 135–145)

## 2022-01-25 LAB — URINALYSIS, ROUTINE W REFLEX MICROSCOPIC
Bilirubin Urine: NEGATIVE
Glucose, UA: NEGATIVE mg/dL
Hgb urine dipstick: NEGATIVE
Ketones, ur: NEGATIVE mg/dL
Leukocytes,Ua: NEGATIVE
Nitrite: NEGATIVE
Protein, ur: NEGATIVE mg/dL
Specific Gravity, Urine: 1.018 (ref 1.005–1.030)
pH: 7 (ref 5.0–8.0)

## 2022-01-25 LAB — VITAMIN B12: Vitamin B-12: 750 pg/mL (ref 180–914)

## 2022-01-25 LAB — TROPONIN I (HIGH SENSITIVITY): Troponin I (High Sensitivity): 58 ng/L — ABNORMAL HIGH (ref <18)

## 2022-01-25 LAB — CORTISOL
Cortisol, Plasma: 2.4 ug/dL
Cortisol, Plasma: 2.5 ug/dL

## 2022-01-25 LAB — MAGNESIUM: Magnesium: 1.7 mg/dL (ref 1.7–2.4)

## 2022-01-25 LAB — CBG MONITORING, ED: Glucose-Capillary: 84 mg/dL (ref 70–99)

## 2022-01-25 LAB — TSH: TSH: 1.342 u[IU]/mL (ref 0.350–4.500)

## 2022-01-25 MED ORDER — LOSARTAN POTASSIUM 25 MG PO TABS
12.5000 mg | ORAL_TABLET | Freq: Every day | ORAL | Status: DC
  Administered 2022-01-25 – 2022-01-26 (×2): 12.5 mg via ORAL
  Filled 2022-01-25: qty 0.5
  Filled 2022-01-25: qty 1

## 2022-01-25 MED ORDER — ACETAMINOPHEN 325 MG PO TABS: 650.0000 mg | ORAL_TABLET | Freq: Four times a day (QID) | ORAL | Status: DC | PRN

## 2022-01-25 MED ORDER — TIZANIDINE HCL 4 MG PO TABS: 4.0000 mg | ORAL_TABLET | Freq: Every evening | ORAL | Status: DC | PRN

## 2022-01-25 MED ORDER — CLOPIDOGREL BISULFATE 75 MG PO TABS
75.0000 mg | ORAL_TABLET | Freq: Every day | ORAL | Status: DC
  Administered 2022-01-25 – 2022-01-26 (×2): 75 mg via ORAL
  Filled 2022-01-25 (×2): qty 1

## 2022-01-25 MED ORDER — MIRTAZAPINE 15 MG PO TABS
15.0000 mg | ORAL_TABLET | Freq: Every day | ORAL | Status: DC
  Administered 2022-01-25: 15 mg via ORAL
  Filled 2022-01-25: qty 1

## 2022-01-25 MED ORDER — ROSUVASTATIN CALCIUM 20 MG PO TABS
40.0000 mg | ORAL_TABLET | Freq: Every day | ORAL | Status: DC
  Administered 2022-01-25 – 2022-01-26 (×2): 40 mg via ORAL
  Filled 2022-01-25 (×2): qty 2

## 2022-01-25 MED ORDER — TRAZODONE HCL 50 MG PO TABS
25.0000 mg | ORAL_TABLET | Freq: Every evening | ORAL | Status: DC | PRN
  Administered 2022-01-25: 25 mg via ORAL
  Filled 2022-01-25: qty 1

## 2022-01-25 MED ORDER — RANOLAZINE ER 500 MG PO TB12
500.0000 mg | ORAL_TABLET | Freq: Two times a day (BID) | ORAL | Status: DC
  Administered 2022-01-25 – 2022-01-26 (×3): 500 mg via ORAL
  Filled 2022-01-25 (×3): qty 1

## 2022-01-25 MED ORDER — PANTOPRAZOLE SODIUM 40 MG PO TBEC
40.0000 mg | DELAYED_RELEASE_TABLET | Freq: Two times a day (BID) | ORAL | Status: DC
  Administered 2022-01-25 – 2022-01-26 (×3): 40 mg via ORAL
  Filled 2022-01-25 (×4): qty 1

## 2022-01-25 MED ORDER — NITROGLYCERIN 0.4 MG SL SUBL: 0.4000 mg | SUBLINGUAL_TABLET | SUBLINGUAL | Status: DC | PRN

## 2022-01-25 MED ORDER — DEXAMETHASONE SODIUM PHOSPHATE 10 MG/ML IJ SOLN
6.0000 mg | INTRAMUSCULAR | Status: DC
  Administered 2022-01-25 – 2022-01-26 (×2): 6 mg via INTRAVENOUS
  Filled 2022-01-25 (×4): qty 1

## 2022-01-25 MED ORDER — CARVEDILOL 3.125 MG PO TABS: 3.1250 mg | ORAL_TABLET | Freq: Every day | ORAL | Status: DC

## 2022-01-25 MED ORDER — GABAPENTIN 100 MG PO CAPS
100.0000 mg | ORAL_CAPSULE | Freq: Two times a day (BID) | ORAL | Status: DC
  Administered 2022-01-25 – 2022-01-26 (×3): 100 mg via ORAL
  Filled 2022-01-25 (×4): qty 1

## 2022-01-25 MED ORDER — POTASSIUM CHLORIDE 20 MEQ PO PACK
40.0000 meq | PACK | Freq: Once | ORAL | Status: AC
  Administered 2022-01-25: 40 meq via ORAL
  Filled 2022-01-25: qty 2

## 2022-01-25 MED ORDER — ACETAMINOPHEN 650 MG RE SUPP: 650.0000 mg | Freq: Four times a day (QID) | RECTAL | Status: DC | PRN

## 2022-01-25 MED ORDER — EZETIMIBE 10 MG PO TABS
10.0000 mg | ORAL_TABLET | Freq: Every day | ORAL | Status: DC
  Administered 2022-01-25 – 2022-01-26 (×2): 10 mg via ORAL
  Filled 2022-01-25 (×2): qty 1

## 2022-01-25 MED ORDER — SODIUM CHLORIDE 0.9% FLUSH
3.0000 mL | Freq: Two times a day (BID) | INTRAVENOUS | Status: DC
  Administered 2022-01-25 – 2022-01-26 (×4): 3 mL via INTRAVENOUS

## 2022-01-25 MED ORDER — ENOXAPARIN SODIUM 40 MG/0.4ML IJ SOSY
40.0000 mg | PREFILLED_SYRINGE | INTRAMUSCULAR | Status: DC
  Administered 2022-01-25: 40 mg via SUBCUTANEOUS
  Filled 2022-01-25: qty 0.4

## 2022-01-25 MED ORDER — MAGNESIUM HYDROXIDE 400 MG/5ML PO SUSP: 30.0000 mL | Freq: Every day | ORAL | Status: DC | PRN

## 2022-01-25 MED ORDER — POTASSIUM CHLORIDE IN NACL 20-0.9 MEQ/L-% IV SOLN
INTRAVENOUS | Status: DC
  Filled 2022-01-25: qty 1000

## 2022-01-25 MED ORDER — PERFLUTREN LIPID MICROSPHERE
1.0000 mL | INTRAVENOUS | Status: AC | PRN
  Administered 2022-01-25: 5 mL via INTRAVENOUS

## 2022-01-25 MED ORDER — POTASSIUM CHLORIDE IN NACL 20-0.9 MEQ/L-% IV SOLN
INTRAVENOUS | Status: AC
  Filled 2022-01-25 (×2): qty 1000

## 2022-01-25 MED ORDER — MAGNESIUM SULFATE 2 GM/50ML IV SOLN
2.0000 g | Freq: Once | INTRAVENOUS | Status: AC
  Administered 2022-01-25: 2 g via INTRAVENOUS
  Filled 2022-01-25: qty 50

## 2022-01-25 MED ORDER — NEBIVOLOL HCL 5 MG PO TABS
5.0000 mg | ORAL_TABLET | Freq: Every day | ORAL | Status: DC
  Administered 2022-01-25 – 2022-01-26 (×2): 5 mg via ORAL
  Filled 2022-01-25 (×2): qty 1

## 2022-01-25 MED ORDER — VITAMIN B-12 1000 MCG PO TABS
1000.0000 ug | ORAL_TABLET | Freq: Every day | ORAL | Status: DC
  Administered 2022-01-25 – 2022-01-26 (×2): 1000 ug via ORAL
  Filled 2022-01-25 (×2): qty 1

## 2022-01-25 NOTE — H&P (Addendum)
[place]   PATIENT NAME: [name]    MR#:  [id number]  DATE OF BIRTH:  1951-02-19  DATE OF ADMISSION:  01/24/2022  PRIMARY CARE PHYSICIAN: Venia Carbon, MD   Patient is coming from: Home  REQUESTING/REFERRING PHYSICIAN: Delora Fuel, MD  CHIEF COMPLAINT:   Chief Complaint  Patient presents with   Dizziness    HISTORY OF PRESENT ILLNESS:  BOSCO PAPARELLA is a 71 y.o. male with medical history significant for alcohol abuse, coronary artery disease, cardiomyopathy, GERD, hypertension, dyslipidemia, and PAD who presented to the emergency room with acute onset of dizziness at home with hypotension and systolic BP  in the 56C.  He denied any new paresthesias or focal muscle weakness.  He occasionally wakes up with numb hands without lower extremity numbness or tingling.  He denies any chest pain or dyspnea or palpitations.  No cough or wheezing.  No nausea or vomiting or diarrhea or abdominal pain.  No dysuria, oliguria or hematuria or flank pain.  ED Course: When he came to the ER, heart rate was 53 with otherwise normal vital signs.  Labs revealed hyponatremia of 133 and hypokalemia of 3.1 with otherwise unremarkable BMP.  Magnesium level was 1.7 and high sensitive troponin I was 79 then 58.  CBC showed anemia slightly worse than previous levels.  He had a WBC of 3.9.  Plasma cortisol was 2.4. EKG as reviewed by me : EKG showed normal sinus rhythm with a rate of 53 with poor R wave progression and prolonged QT interval with QTc of 524 MS. Imaging: Portable chest ray showed no acute cardiopulmonary disease.Noncontrasted CT scan showed no acute intracranial normalities.  CTA of the chest abdomen and pelvis came back with the following: 1. Interval increase in size of a 4.4 x 4.5 cm (from 3.6 cm in 2016) infrarenal abdominal aorta aneurysm. Recommend follow-up CT/MR every 6 months and vascular consultation. This recommendation follows ACR consensus guidelines: White  Paper of the ACR Incidental Findings Committee II on Vascular Findings. J Am Coll Radiol 2013; 10:789-794. 2. No pulmonary embolus. 3. Aortic Atherosclerosis (ICD10-I70.0) - severe with four-vessel coronary calcification status post coronary artery bypass graft. 4. 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 5. Cholelithiasis with no CT finding of acute cholecystitis. 6. Colonic diverticulosis with no acute diverticulitis.  The patient was given 40 mill Cabbell p.o. potassium chloride and 1 L bolus of IV normal saline.  He will be admitted to a medical telemetry patient bed for further evaluation and management  PAST MEDICAL HISTORY:   Past Medical History:  Diagnosis Date   AAA (abdominal aortic aneurysm) ([place])    a. Duplex 01/2012: stable infrarenal saccular AAA at 3.3cm x 3.2xm (f/u recommended 01/2013 per Dr. Rockey Situ)   Alcohol abuse    CAD (coronary artery disease)    a. 1995 s/p CABG x 4: LIMA->LAD, VG->RI (known to be occluded), VG->AM->PDA;  b. 05/2002 Inf STEMI: VG->AM->PDA 100% treated w/ 2 BMS complicated by acute thrombosis req 3 BMS;  c. 07/2003 DES to  LAD & LCX (VG's to PDA & RI 100%);  d. 12/2010 Acute MI ([place]): DES to LCX & LM , LIMA ok,;  e.12/2011 Cath: LM/LCX stents ok , LIMA patent.   Cardiomyopathy ([place])    a. EF 35% by cath 2015.   Diverticulitis    1/06 Diverticulitis--CT of pelvis--diffuse sigmoid divertic   Dyspnea    GERD (gastroesophageal reflux disease)  Hyperlipidemia    Hypertension    Myocardial infarction [facility])    PAD (peripheral artery disease) ([place])    a. external iliac and mesenteric stenosis noted by noninvasive imaging.   Renal artery stenosis ([place])    a. 03/2011 PTA and stenting of L RA. b. last duplex 2016 with stable 1-59% bilateral RAS, incidental >50% R EIA stenosis    PAST SURGICAL HISTORY:   Past Surgical History:  Procedure Laterality Date   ANGIOPLASTY  1997   CARDIAC CATHETERIZATION      8/09  Cath--vein graft occlusions which are old--no acute changes   CARDIAC CATHETERIZATION  11/13/2010   stent x 2 @ [place]   CARDIAC CATHETERIZATION  04/05/2014   stent placement    CARDIAC CATHETERIZATION  10/28/2017   CLOSED REDUCTION SHOULDER DISLOCATION     COLONOSCOPY WITH PROPOFOL N/A 12/22/2018   Procedure: COLONOSCOPY WITH PROPOFOL;  Surgeon: Jonathon Bellows, MD;  Location: [facility] ENDOSCOPY;  Service: Gastroenterology;  Laterality: N/A;   CORONARY ANGIOPLASTY  04/05/2014   stent placement OM 1   CORONARY ARTERY BYPASS GRAFT     CORONARY STENT PLACEMENT  7/12   2 stents--Promus element plus (everolimus eluting)--[facility] in [place] WIRE/FFR STUDY N/A 05/13/2021   Procedure: INTRAVASCULAR PRESSURE WIRE/FFR STUDY;  Surgeon: Early Osmond, MD;  Location: [place] CV LAB;  Service: Cardiovascular;  Laterality: N/A;   LEFT HEART CATH AND CORONARY ANGIOGRAPHY N/A 05/13/2021   Procedure: LEFT HEART CATH AND CORONARY ANGIOGRAPHY;  Surgeon: Early Osmond, MD;  Location: [place] CV LAB;  Service: Cardiovascular;  Laterality: N/A;   LEFT HEART CATH AND CORS/GRAFTS ANGIOGRAPHY N/A 10/28/2017   Procedure: LEFT HEART CATH AND CORS/GRAFTS ANGIOGRAPHY;  Surgeon: Leonie Man, MD;  Location: [place] CV LAB;  Service: Cardiovascular;  Laterality: N/A;   LEFT HEART CATHETERIZATION WITH CORONARY/GRAFT ANGIOGRAM N/A 01/04/2012   Procedure: LEFT HEART CATHETERIZATION WITH Beatrix Fetters;  Surgeon: Burnell Blanks, MD;  Location: [facility] CATH LAB;  Service: Cardiovascular;  Laterality: N/A;   LEFT HEART CATHETERIZATION WITH CORONARY/GRAFT ANGIOGRAM N/A 04/05/2014   Procedure: LEFT HEART CATHETERIZATION WITH Beatrix Fetters;  Surgeon: Jettie Booze, MD;  Location: [facility] CATH LAB;  Service: Cardiovascular;  Laterality: N/A;   PERCUTANEOUS CORONARY STENT INTERVENTION (PCI-S)  04/05/2014   Procedure: PERCUTANEOUS CORONARY STENT  INTERVENTION (PCI-S);  Surgeon: Jettie Booze, MD;  Location: [facility] CATH LAB;  Service: Cardiovascular;;  OM1   RENAL ANGIOGRAM N/A 04/16/2011   Procedure: RENAL ANGIOGRAM;  Surgeon: Burnell Blanks, MD;  Location: [facility] CATH LAB;  Service: Cardiovascular;  Laterality: N/A;   RENAL ARTERY STENT  03/2011 ?    SOCIAL HISTORY:   Social History   Tobacco Use   Smoking status: Former    Packs/day: 3.00    Years: 25.00    Total pack years: 75.00    Types: Cigarettes    Quit date: 05/28/1993    Years since quitting: 28.6    Passive exposure: Past   Smokeless tobacco: Never  Substance Use Topics   Alcohol use: Yes    Alcohol/week: 42.0 standard drinks of alcohol    Types: 42 Cans of beer per week    FAMILY HISTORY:   Family History  Problem Relation Age of Onset   Coronary artery disease Mother        Died MI age 45   Hypertension Mother    Heart attack Mother    Coronary artery disease Sister  Living   Coronary artery disease Brother        Living   Coronary artery disease Father        Died MI age 23   Heart attack Father    Heart attack Maternal Grandfather    Diabetes Neg Hx    Cancer Neg Hx        prostate or colon    DRUG ALLERGIES:   Allergies  Allergen Reactions   Metoprolol Tartrate Hives and Itching   Quinolones Other (See Comments)    Contraindicated with AAA   Metoclopramide Nausea Only    Other reaction(s): Unknown    REVIEW OF SYSTEMS:   ROS As per history of present illness. All pertinent systems were reviewed above. Constitutional, HEENT, cardiovascular, respiratory, GI, GU, musculoskeletal, neuro, psychiatric, endocrine, integumentary and hematologic systems were reviewed and are otherwise negative/unremarkable except for positive findings mentioned above in the HPI.   MEDICATIONS AT HOME:   Prior to Admission medications   Medication Sig Start Date End Date Taking? Authorizing Provider  aspirin 81 MG tablet Take 81 mg by mouth  daily.   Yes [provider]  carvedilol (COREG) 3.125 MG tablet TAKE 1 TABLET (3.125 MG TOTAL) BY MOUTH 2 (TWO) TIMES DAILY. Patient taking differently: Take 3.125 mg by mouth daily at 12 noon. 04/29/21  Yes Gollan, Kathlene November, MD  clopidogrel (PLAVIX) 75 MG tablet TAKE 1 TABLET BY MOUTH EVERY DAY Patient taking differently: Take 75 mg by mouth daily. 09/22/21  Yes Gollan, Kathlene November, MD  cyanocobalamin (VITAMIN B12) 1000 MCG tablet Take 1,000 mcg by mouth daily.   Yes [provider]  ezetimibe (ZETIA) 10 MG tablet TAKE 1 TABLET (10 MG TOTAL) BY MOUTH DAILY. 04/13/21  Yes Minna Merritts, MD  gabapentin (NEURONTIN) 100 MG capsule Take 1 capsule (100 mg total) by mouth 2 (two) times daily. 09/01/21  Yes Venia Carbon, MD  losartan (COZAAR) 25 MG tablet TAKE 1/2 TABLET BY MOUTH EVERY DAY Patient taking differently: Take 12.5 mg by mouth daily. 10/29/21  Yes Gollan, Kathlene November, MD  mirtazapine (REMERON) 15 MG tablet TAKE 1 TABLET BY MOUTH EVERYDAY AT BEDTIME Patient taking differently: Take 15 mg by mouth at bedtime. 04/06/21  Yes Viviana Simpler I, MD  nitroGLYCERIN (NITROSTAT) 0.4 MG SL tablet PLACE 1 TABLET UNDER THE TONGUE EVERY 5 MINUTES AS NEEDED FOR CHEST PAIN. Patient taking differently: Place 0.4 mg under the tongue every 5 (five) minutes as needed for chest pain. 08/13/21  Yes Venia Carbon, MD  pantoprazole (PROTONIX) 40 MG tablet Take 1 tablet (40 mg total) by mouth 2 (two) times daily. 06/04/21  Yes Venia Carbon, MD  ranolazine (RANEXA) 500 MG 12 hr tablet Take 1 tablet (500 mg total) by mouth 2 (two) times daily. 01/01/22  Yes Gollan, Kathlene November, MD  rosuvastatin (CRESTOR) 40 MG tablet TAKE 1 TABLET BY MOUTH EVERY DAY Patient taking differently: Take 40 mg by mouth daily. 06/04/21  Yes Gollan, Kathlene November, MD  tiZANidine (ZANAFLEX) 4 MG tablet Take 1 tablet (4 mg total) by mouth at bedtime as needed for muscle spasms. 09/01/21  Yes Venia Carbon, MD      VITAL SIGNS:   Blood pressure 121/74, pulse 67, temperature 98.6 F (37 C), temperature source Oral, resp. rate 20, height '5\' 3"'$  (1.6 m), weight 73 kg, SpO2 99 %.  PHYSICAL EXAMINATION:  Physical Exam  GENERAL:  71 y.o.-year-old Caucasian male patient lying in the bed with no  acute distress.  EYES: Pupils equal, round, reactive to light and accommodation. No scleral icterus. Extraocular muscles intact.  HEENT: Head atraumatic, normocephalic. Oropharynx and nasopharynx clear.  NECK:  Supple, no jugular venous distention. No thyroid enlargement, no tenderness.  LUNGS: Normal breath sounds bilaterally, no wheezing, rales,rhonchi or crepitation. No use of accessory muscles of respiration.  CARDIOVASCULAR: Regular rate and rhythm, S1, S2 normal. No murmurs, rubs, or gallops.  ABDOMEN: Soft, nondistended, nontender. Bowel sounds present. No organomegaly or mass.  EXTREMITIES: No pedal edema, cyanosis, or clubbing.  NEUROLOGIC: Cranial nerves II through XII are intact. Muscle strength 5/5 in all extremities. Sensation intact. Gait not checked.  PSYCHIATRIC: The patient is alert and oriented x 3.  Normal affect and good eye contact. SKIN: No obvious rash, lesion, or ulcer.   LABORATORY PANEL:   CBC Recent Labs  Lab 01/25/22 0421  WBC 5.0  HGB 9.5*  HCT 28.6*  PLT 244   ------------------------------------------------------------------------------------------------------------------  Chemistries  Recent Labs  Lab 01/24/22 2210 01/24/22 2230 01/25/22 0040  NA 131* 133*  --   K 3.0* 3.1*  --   CL 99 99  --   CO2 21*  --   --   GLUCOSE 103* 96  --   BUN 9 6*  --   CREATININE 1.10 1.00  --   CALCIUM 8.9  --   --   MG  --   --  1.7  AST 18  --   --   ALT 12  --   --   ALKPHOS 36*  --   --   BILITOT 0.7  --   --    ------------------------------------------------------------------------------------------------------------------  Cardiac Enzymes No results for input(s): "TROPONINI" in the  last 168 hours. ------------------------------------------------------------------------------------------------------------------  RADIOLOGY:  CT Angio Chest/Abd/Pel for Dissection W and/or Wo Contrast  Result Date: 01/24/2022 CLINICAL DATA:  Acute aortic syndrome (AAS) suspected hx of AAA r/o rupture vs dissection EXAM: CT ANGIOGRAPHY CHEST, ABDOMEN AND PELVIS TECHNIQUE: Non-contrast CT of the chest was initially obtained. Multidetector CT imaging through the chest, abdomen and pelvis was performed using the standard protocol during bolus administration of intravenous contrast. Multiplanar reconstructed images and MIPs were obtained and reviewed to evaluate the vascular anatomy. RADIATION DOSE REDUCTION: This exam was performed according to the departmental dose-optimization program which includes automated exposure control, adjustment of the mA and/or kV according to patient size and/or use of iterative reconstruction technique. CONTRAST:  129m OMNIPAQUE IOHEXOL 350 MG/ML SOLN COMPARISON:  None Available. FINDINGS: CTA CHEST FINDINGS Cardiovascular: Preferential opacification of the thoracic aorta. No evidence of thoracic aortic aneurysm or dissection. Prominent heart size. No significant pericardial effusion. Severe atherosclerotic plaque of the thoracic aorta. Four-vessel coronary artery calcifications status post coronary artery bypass graft. Right coronary artery stent. The main pulmonary artery is normal in caliber. No pulmonary embolus. Mediastinum/Nodes: No enlarged mediastinal, hilar, or axillary lymph nodes. Thyroid gland, trachea, and esophagus demonstrate no significant findings. Lungs/Pleura: Bilateral lower lobe subsegmental atelectasis. No focal consolidation. No pulmonary nodule. No pulmonary mass. No pleural effusion. No pneumothorax. Musculoskeletal: No chest wall abnormality. No suspicious lytic or blastic osseous lesions. No acute displaced fracture. Multilevel degenerative changes of  the spine. Review of the MIP images confirms the above findings. CTA ABDOMEN AND PELVIS FINDINGS VASCULAR Aorta: Severe atherosclerotic plaque. Interval increase in size of a 4.4 x 4.5 cm (from 3.6 cm in 2016) infrarenal abdominal aorta aneurysm extending approximately 5 cm in the craniocaudal dimension down to the aortic bifurcation. Celiac: Replaced  left gastric artery with origin off of the aorta a just proximal to the celiac artery origin. Patent without evidence of aneurysm, dissection, vasculitis or significant stenosis. 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 But sick a shin distally is noted. No aneurysm, dissection, vasculitis or significant stenosis. Renals: Both renal arteries are patent without evidence of aneurysm, dissection, vasculitis, fibromuscular dysplasia or significant stenosis. IMA: Patent without evidence of aneurysm, dissection, vasculitis or significant stenosis. Inflow: Patent without evidence of aneurysm, dissection, vasculitis or significant stenosis. Veins: No obvious venous abnormality within the limitations of this arterial phase study. Review of the MIP images confirms the above findings. NON-VASCULAR Hepatobiliary: No focal liver abnormality. Calcified gallstone noted within the gallbladder lumen. No gallbladder wall thickening or pericholecystic fluid. No biliary dilatation. Pancreas: No focal lesion. Normal pancreatic contour. No surrounding inflammatory changes. No main pancreatic ductal dilatation. Spleen: Normal in size without focal abnormality. Adrenals/Urinary Tract: No adrenal nodule bilaterally. Bilateral kidneys enhance symmetrically. No hydronephrosis. No hydroureter. The urinary bladder is unremarkable. Stomach/Bowel: Stomach is within normal limits. No evidence of bowel wall thickening or dilatation. Colonic diverticulosis. Appendix appears normal. Lymphatic: No lymphadenopathy. Reproductive: Prostate is  unremarkable. Other: No intraperitoneal free fluid. No intraperitoneal free gas. No organized fluid collection. Musculoskeletal: No abdominal wall hernia or abnormality. No suspicious lytic or blastic osseous lesions. No acute displaced fracture. Stable chronic anterior wedge compression fracture of the L1 vertebral body . Intervertebral disc space vacuum phenomenon. Review of the MIP images confirms the above findings. IMPRESSION: 1. Interval increase in size of a 4.4 x 4.5 cm (from 3.6 cm in 2016) infrarenal abdominal aorta aneurysm. Recommend follow-up CT/MR every 6 months and vascular consultation. This recommendation follows ACR consensus guidelines: White Paper of the ACR Incidental Findings Committee II on Vascular Findings. J Am Coll Radiol 2013; 10:789-794. 2. No pulmonary embolus. 3. Aortic Atherosclerosis (ICD10-I70.0) - severe with four-vessel coronary calcification status post coronary artery bypass graft. 4. 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 5. Cholelithiasis with no CT finding of acute cholecystitis. 6. Colonic diverticulosis with no acute diverticulitis. Electronically Signed   By: Iven Finn M.D.   On: 01/24/2022 23:34   CT HEAD WO CONTRAST (5MM)  Result Date: 01/24/2022 CLINICAL DATA:  Neuro deficit, acute, stroke suspected EXAM: CT HEAD WITHOUT CONTRAST TECHNIQUE: Contiguous axial images were obtained from the base of the skull through the vertex without intravenous contrast. RADIATION DOSE REDUCTION: This exam was performed according to the departmental dose-optimization program which includes automated exposure control, adjustment of the mA and/or kV according to patient size and/or use of iterative reconstruction technique. COMPARISON:  None Available. BRAIN: BRAIN Cerebral ventricle sizes are concordant with the degree of cerebral volume loss. Patchy and confluent areas of decreased attenuation are noted throughout the deep and  periventricular white matter of the cerebral hemispheres bilaterally, compatible with chronic microvascular ischemic disease. No evidence of large-territorial acute infarction. No parenchymal hemorrhage. No mass lesion. No extra-axial collection. No mass effect or midline shift. No hydrocephalus. Basilar cisterns are patent. Vascular: No hyperdense vessel. Atherosclerotic calcifications are present within the cavernous internal carotid arteries. Skull: No acute fracture or focal lesion. Sinuses/Orbits: Paranasal sinuses and mastoid air cells are clear. The orbits are unremarkable. Other: None. IMPRESSION: No acute intracranial abnormality. Electronically Signed   By: Iven Finn M.D.   On: 01/24/2022 23:17   DG Chest Port 1 View  Result Date: 01/24/2022 CLINICAL DATA:  Hypotension  EXAM: PORTABLE CHEST 1 VIEW COMPARISON:  05/13/2021 FINDINGS: Lungs are clear.  No pleural effusion or pneumothorax. The heart is normal in size. Postsurgical changes related to prior CABG. Median sternotomy. IMPRESSION: No evidence of acute cardiopulmonary disease. Electronically Signed   By: Julian Hy M.D.   On: 01/24/2022 21:58      IMPRESSION AND PLAN:  Assessment and Plan: * Postural dizziness with presyncope - The patient will be admitted to a medical telemetry observation bed. - We will continue hydration with IV normal saline. - We will follow his orthostatics. - This like secondary to orthostatic hypotension. - He will be monitored for arrhythmias. - We will obtain a 2D echo and carotid Doppler for further assessment.  Hyponatremia - This is likely hypovolemic. - The patient will be hydrated with IV normal saline and will follow BMP.  Hypokalemia Potassium will be replaced and magnesium will be optimized.  Dyslipidemia - We will continue statin therapy.  Coronary artery disease -We will continue Ranexa, statin therapy and Zetia as well as beta-blocker therapy with Coreg, aspirin and  Plavix.  Essential hypertension - We will continue his antihypertensives.       DVT prophylaxis: Lovenox.  Advanced Care Planning:  Code Status: full code.  Family Communication:  The plan of care was discussed in details with the patient (and family). I answered all questions. The patient agreed to proceed with the above mentioned plan. Further management will depend upon hospital course. Disposition Plan: Back to previous home environment Consults called: none.  All the records are reviewed and case discussed with ED provider.  Status is: Observation   I certify that at the time of admission, it is my clinical judgment that the patient will require hospital care extending less than 2 midnights.                            Dispo: The patient is from: Home              Anticipated d/c is to: Home              Patient currently is not medically stable to d/c.              Difficult to place patient: No  Christel Mormon M.D on 01/25/2022 at 5:17 AM  [facility]   From 7 PM-7 AM, contact night-coverage [email or web address]  CC: Primary care physician; Venia Carbon, MD

## 2022-01-25 NOTE — ED Notes (Signed)
Pt in room A&O x4. Denies any CP at this time. Pt ambulated to restroom with a steady gait. VS stable. Pt updated on plan of care.

## 2022-01-25 NOTE — Progress Notes (Signed)
PT Cancellation Note  Patient Details Name: [name] MRN: [id number] DOB: 10/05/1950   Cancelled Treatment:    Reason Eval/Treat Not Completed: Patient at procedure or test/unavailable.  Having echocardiogram, retry as time and pt allow.   Ramond Dial 01/25/2022, 10:26 AM  Mee Hives, PT PhD Acute Rehab Dept. Number: [place] and [place]

## 2022-01-25 NOTE — Assessment & Plan Note (Signed)
-   This is likely hypovolemic. - The patient will be hydrated with IV normal saline and will follow BMP.

## 2022-01-25 NOTE — Progress Notes (Signed)
PROGRESS NOTE                                                                                                                                                                                                             Patient Demographics:    [name], is a 71 y.o. male, DOB - Nov 07, 1950, BJY:[id number]  Outpatient Primary MD for the patient is Venia Carbon, MD    LOS - 0  Admit date - 01/24/2022    Chief Complaint  Patient presents with   Dizziness       Brief Narrative (HPI from H&P)   71 y.o. male with medical history significant for alcohol abuse, coronary artery disease, cardiomyopathy, GERD, hypertension, dyslipidemia, and PAD who presented to the emergency room with dizziness associated with low blood pressure in the evenings ongoing for several weeks.  With these episodes he sometimes gets some tingling numbness in his arms.  No focal weakness or headache, in the ER he was found to be hypotensive and dehydrated was admitted to the hospital.   Subjective:    [name] today has, No headache, No chest pain, No abdominal pain - No Nausea, No new weakness tingling or numbness, no SOB   Assessment  & Plan :    Dizziness associated with episodes of extreme hypotension in the evenings ongoing now for several weeks. He is also found to be dehydrated, even with hypotension and under stress random cortisol levels low suggestive of relative renal insufficiency, he is also noted to be on Coreg.  At this time we will discontinue Coreg, hydrate with IV fluids, replace electrolytes, placed on IV Decadron, obtain echocardiogram.  Monitor blood pressure throughout the day to see if evening drops in blood pressure resolved with above intervention.  PT OT and monitor.  Hydration with hypokalemia and hyponatremia - IVF & replace electrolytes.   Dyslipidemia - We will continue statin therapy.  Coronary artery disease  -  We will continue Ranexa, statin therapy and Zetia as well as beta-blocker therapy. aspirin and Plavix. Hold Coreg for now.  Mildly elevated troponin, trend flat and in non-ACS pattern, check echocardiogram to evaluate EF and wall motion.  Essential hypertension - switch Coreg to Bystolic as there is less drop in orthostatic hypotension with it and monitor.  Is allergic to Toprol.  B12 deficiency.  Check and replace.        Condition - Fair  Family Communication  :  None  Code Status :  Full  Consults  :  None  PUD Prophylaxis :    Procedures  :     TTE -  CT Head - Non acute   CTA chest -  1. Interval increase in size of a 4.4 x 4.5 cm (from 3.6 cm in 2016) infrarenal abdominal aorta aneurysm. Recommend follow-up CT/MR every 6 months and vascular consultation. This recommendation follows ACR consensus guidelines: White Paper of the ACR Incidental Findings Committee II on Vascular Findings. J Am Coll Radiol 2013; 10:789-794. 2. No pulmonary embolus. 3. Aortic Atherosclerosis (ICD10-I70.0) - severe with four-vessel coronary calcification status post coronary artery bypass graft. 4. 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 5. Cholelithiasis with no CT finding of acute cholecystitis. 6. Colonic diverticulosis with no acute diverticulitis.      Disposition Plan  :    Status is: Observation  DVT Prophylaxis  :    enoxaparin (LOVENOX) injection 40 mg Start: 01/25/22 1400 Place TED hose Start: 01/25/22 0813    Lab Results  Component Value Date   PLT 244 01/25/2022    Diet :  Diet Order             Diet Heart Room service appropriate? Yes; Fluid consistency: Thin  Diet effective now                    Inpatient Medications  Scheduled Meds:  clopidogrel  75 mg Oral Daily   cyanocobalamin  1,000 mcg Oral Daily   dexamethasone (DECADRON) injection  6 mg Intravenous Q24H   enoxaparin (LOVENOX) injection  40 mg  Subcutaneous Q24H   ezetimibe  10 mg Oral Daily   gabapentin  100 mg Oral BID   losartan  12.5 mg Oral Daily   mirtazapine  15 mg Oral QHS   pantoprazole  40 mg Oral BID   ranolazine  500 mg Oral BID   rosuvastatin  40 mg Oral Daily   sodium chloride flush  3 mL Intravenous Q12H   Continuous Infusions:  0.9 % NaCl with KCl 20 mEq / L     PRN Meds:.acetaminophen **OR** acetaminophen, magnesium hydroxide, nitroGLYCERIN, traZODone  Antibiotics  :    Anti-infectives (From admission, onward)    None        Time Spent in minutes  30   Lala Lund M.D on 01/25/2022 at 8:13 AM  To page go to [email or web address]   [facility] -  Office  [phone]  See all Orders from today for further details    Objective:   Vitals:   01/25/22 0200 01/25/22 0330 01/25/22 0626 01/25/22 0745  BP: 116/70 121/74 132/79 139/66  Pulse: 65 67 71 61  Resp: '18 20 18 '$ (!) 22  Temp:   98.4 F (36.9 C)   TempSrc:   Oral   SpO2: 97% 99% 100% 98%  Weight:      Height:        Wt Readings from Last 3 Encounters:  01/24/22 73 kg  01/01/22 75.5 kg  12/15/21 75.3 kg     Intake/Output Summary (Last 24 hours) at 01/25/2022 0813 Last data filed at 01/25/2022 0035 Gross per 24 hour  Intake 1000 ml  Output --  Net 1000 ml     Physical Exam  Awake Alert, No new F.N deficits, Normal affect .AT,PERRAL Supple  Neck, No JVD,   Symmetrical Chest wall movement, Good air movement bilaterally, CTAB RRR,No Gallops,Rubs or new Murmurs,  +ve B.Sounds, Abd Soft, No tenderness,   No Cyanosis, Clubbing or edema       Data Review:    CBC Recent Labs  Lab 01/24/22 2210 01/24/22 2230 01/25/22 0421  WBC 6.8  --  5.0  HGB 10.5* 11.2* 9.5*  HCT 31.3* 33.0* 28.6*  PLT 260  --  244  MCV 80.3  --  81.7  MCH 26.9  --  27.1  MCHC 33.5  --  33.2  RDW 17.6*  --  18.0*  LYMPHSABS 2.0  --   --   MONOABS 0.9  --   --   EOSABS 0.3  --   --   BASOSABS 0.1  --   --     Electrolytes Recent Labs   Lab 01/24/22 2210 01/24/22 2230 01/25/22 0040 01/25/22 0421  NA 131* 133*  --  135  K 3.0* 3.1*  --  4.1  CL 99 99  --  108  CO2 21*  --   --  22  GLUCOSE 103* 96  --  97  BUN 9 6*  --  6*  CREATININE 1.10 1.00  --  1.00  CALCIUM 8.9  --   --  8.5*  AST 18  --   --   --   ALT 12  --   --   --   ALKPHOS 36*  --   --   --   BILITOT 0.7  --   --   --   ALBUMIN 3.5  --   --   --   MG  --   --  1.7  --     ------------------------------------------------------------------------------------------------------------------ No results for input(s): "CHOL", "HDL", "LDLCALC", "TRIG", "CHOLHDL", "LDLDIRECT" in the last 72 hours.  Lab Results  Component Value Date   HGBA1C 6.8 (H) 05/14/2021    No results for input(s): "TSH", "T4TOTAL", "T3FREE", "THYROIDAB" in the last 72 hours.  Invalid input(s): "FREET3" ------------------------------------------------------------------------------------------------------------------ ID Labs Recent Labs  Lab 01/24/22 2210 01/24/22 2230 01/25/22 0421  WBC 6.8  --  5.0  PLT 260  --  244  CREATININE 1.10 1.00 1.00   Cardiac Enzymes No results for input(s): "CKMB", "TROPONINI", "MYOGLOBIN" in the last 168 hours.  Invalid input(s): "CK"       Micro Results No results found for this or any previous visit (from the past 240 hour(s)).  Radiology Reports CT Angio Chest/Abd/Pel for Dissection W and/or Wo Contrast  Result Date: 01/24/2022 CLINICAL DATA:  Acute aortic syndrome (AAS) suspected hx of AAA r/o rupture vs dissection EXAM: CT ANGIOGRAPHY CHEST, ABDOMEN AND PELVIS TECHNIQUE: Non-contrast CT of the chest was initially obtained. Multidetector CT imaging through the chest, abdomen and pelvis was performed using the standard protocol during bolus administration of intravenous contrast. Multiplanar reconstructed images and MIPs were obtained and reviewed to evaluate the vascular anatomy. RADIATION DOSE REDUCTION: This exam was performed  according to the departmental dose-optimization program which includes automated exposure control, adjustment of the mA and/or kV according to patient size and/or use of iterative reconstruction technique. CONTRAST:  163m OMNIPAQUE IOHEXOL 350 MG/ML SOLN COMPARISON:  None Available. FINDINGS: CTA CHEST FINDINGS Cardiovascular: Preferential opacification of the thoracic aorta. No evidence of thoracic aortic aneurysm or dissection. Prominent heart size. No significant pericardial effusion. Severe atherosclerotic plaque of the thoracic aorta. Four-vessel coronary artery calcifications status post coronary artery bypass graft. Right coronary  artery stent. The main pulmonary artery is normal in caliber. No pulmonary embolus. Mediastinum/Nodes: No enlarged mediastinal, hilar, or axillary lymph nodes. Thyroid gland, trachea, and esophagus demonstrate no significant findings. Lungs/Pleura: Bilateral lower lobe subsegmental atelectasis. No focal consolidation. No pulmonary nodule. No pulmonary mass. No pleural effusion. No pneumothorax. Musculoskeletal: No chest wall abnormality. No suspicious lytic or blastic osseous lesions. No acute displaced fracture. Multilevel degenerative changes of the spine. Review of the MIP images confirms the above findings. CTA ABDOMEN AND PELVIS FINDINGS VASCULAR Aorta: Severe atherosclerotic plaque. Interval increase in size of a 4.4 x 4.5 cm (from 3.6 cm in 2016) infrarenal abdominal aorta aneurysm extending approximately 5 cm in the craniocaudal dimension down to the aortic bifurcation. Celiac: Replaced left gastric artery with origin off of the aorta a just proximal to the celiac artery origin. Patent without evidence of aneurysm, dissection, vasculitis or significant stenosis. 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 But sick a shin distally is noted. No aneurysm, dissection, vasculitis or significant stenosis. Renals: Both renal  arteries are patent without evidence of aneurysm, dissection, vasculitis, fibromuscular dysplasia or significant stenosis. IMA: Patent without evidence of aneurysm, dissection, vasculitis or significant stenosis. Inflow: Patent without evidence of aneurysm, dissection, vasculitis or significant stenosis. Veins: No obvious venous abnormality within the limitations of this arterial phase study. Review of the MIP images confirms the above findings. NON-VASCULAR Hepatobiliary: No focal liver abnormality. Calcified gallstone noted within the gallbladder lumen. No gallbladder wall thickening or pericholecystic fluid. No biliary dilatation. Pancreas: No focal lesion. Normal pancreatic contour. No surrounding inflammatory changes. No main pancreatic ductal dilatation. Spleen: Normal in size without focal abnormality. Adrenals/Urinary Tract: No adrenal nodule bilaterally. Bilateral kidneys enhance symmetrically. No hydronephrosis. No hydroureter. The urinary bladder is unremarkable. Stomach/Bowel: Stomach is within normal limits. No evidence of bowel wall thickening or dilatation. Colonic diverticulosis. Appendix appears normal. Lymphatic: No lymphadenopathy. Reproductive: Prostate is unremarkable. Other: No intraperitoneal free fluid. No intraperitoneal free gas. No organized fluid collection. Musculoskeletal: No abdominal wall hernia or abnormality. No suspicious lytic or blastic osseous lesions. No acute displaced fracture. Stable chronic anterior wedge compression fracture of the L1 vertebral body . Intervertebral disc space vacuum phenomenon. Review of the MIP images confirms the above findings. IMPRESSION: 1. Interval increase in size of a 4.4 x 4.5 cm (from 3.6 cm in 2016) infrarenal abdominal aorta aneurysm. Recommend follow-up CT/MR every 6 months and vascular consultation. This recommendation follows ACR consensus guidelines: White Paper of the ACR Incidental Findings Committee II on Vascular Findings. J Am Coll  Radiol 2013; 10:789-794. 2. No pulmonary embolus. 3. Aortic Atherosclerosis (ICD10-I70.0) - severe with four-vessel coronary calcification status post coronary artery bypass graft. 4. 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 5. Cholelithiasis with no CT finding of acute cholecystitis. 6. Colonic diverticulosis with no acute diverticulitis. Electronically Signed   By: Iven Finn M.D.   On: 01/24/2022 23:34   CT HEAD WO CONTRAST (5MM)  Result Date: 01/24/2022 CLINICAL DATA:  Neuro deficit, acute, stroke suspected EXAM: CT HEAD WITHOUT CONTRAST TECHNIQUE: Contiguous axial images were obtained from the base of the skull through the vertex without intravenous contrast. RADIATION DOSE REDUCTION: This exam was performed according to the departmental dose-optimization program which includes automated exposure control, adjustment of the mA and/or kV according to patient size and/or use of iterative reconstruction technique. COMPARISON:  None Available. BRAIN: BRAIN Cerebral ventricle sizes are concordant with the degree of cerebral  volume loss. Patchy and confluent areas of decreased attenuation are noted throughout the deep and periventricular white matter of the cerebral hemispheres bilaterally, compatible with chronic microvascular ischemic disease. No evidence of large-territorial acute infarction. No parenchymal hemorrhage. No mass lesion. No extra-axial collection. No mass effect or midline shift. No hydrocephalus. Basilar cisterns are patent. Vascular: No hyperdense vessel. Atherosclerotic calcifications are present within the cavernous internal carotid arteries. Skull: No acute fracture or focal lesion. Sinuses/Orbits: Paranasal sinuses and mastoid air cells are clear. The orbits are unremarkable. Other: None. IMPRESSION: No acute intracranial abnormality. Electronically Signed   By: Iven Finn M.D.   On: 01/24/2022 23:17   DG Chest Port 1  View  Result Date: 01/24/2022 CLINICAL DATA:  Hypotension EXAM: PORTABLE CHEST 1 VIEW COMPARISON:  05/13/2021 FINDINGS: Lungs are clear.  No pleural effusion or pneumothorax. The heart is normal in size. Postsurgical changes related to prior CABG. Median sternotomy. IMPRESSION: No evidence of acute cardiopulmonary disease. Electronically Signed   By: Julian Hy M.D.   On: 01/24/2022 21:58

## 2022-01-25 NOTE — Assessment & Plan Note (Signed)
Potassium will be replaced and magnesium will be optimized.

## 2022-01-25 NOTE — Assessment & Plan Note (Addendum)
-   The patient will be admitted to a medical telemetry observation bed. - We will continue hydration with IV normal saline. - We will follow his orthostatics. - This like secondary to orthostatic hypotension. - He will be monitored for arrhythmias. - We will obtain a 2D echo and carotid Doppler for further assessment. - Differential diagnosis would include adrenal insufficiency given low plasma cortisol level. - We will obtain an 8 AM level.

## 2022-01-25 NOTE — Assessment & Plan Note (Signed)
-  We will continue Ranexa, statin therapy and Zetia as well as beta-blocker therapy with Coreg, aspirin and Plavix.

## 2022-01-25 NOTE — Progress Notes (Signed)
Physical Therapy Evaluation Patient Details Name: [name] MRN: [id number] DOB: Oct 17, 1950 Today's Date: 01/25/2022  History of Present Illness  71 yo male with onset of dizziness in the evening after second dose of BP meds was admitted on 10/1, had very low BP in 31'D systolic.  Also found to have interval increase in size of a 4.4 x 4.5 cm (from 3.6 cm in 2016) infrarenal abdominal aorta aneurysm.  Cleared for PE, has diverticulosis.   PMHx:  atherosclerosis, cholelithiasis, EtOH abuse, CAD, cardiomyopathy, GERD, HTN, DLD, PAD  Clinical Impression  Pt was seen for mobility on hallway with only supervised help needed to mobilize, but also had controlled BP in the transition.  Supine 138/87, sitting 152/90 and standing 157/86.  Pt is not symptomatic as well, but again reports a history of mainly dropping in the evening after taking his meds the second time for BP.  Follow acutely and recommend he go to outpatient to work on his issues with back pain before he is to make a decision to get back surgery.  Follow as goals of acute PT outline, MT recommended as well.      Recommendations for follow up therapy are one component of a multi-disciplinary discharge planning process, led by the attending physician.  Recommendations may be updated based on patient status, additional functional criteria and insurance authorization.  Follow Up Recommendations Outpatient PT      Assistance Recommended at Discharge PRN  Patient can return home with the following  Assistance with cooking/housework    Equipment Recommendations None recommended by PT  Recommendations for Other Services       Functional Status Assessment Patient has had a recent decline in their functional status and demonstrates the ability to make significant improvements in function in a reasonable and predictable amount of time.     Precautions / Restrictions Precautions Precautions: Fall Precaution Comments: monitor  BP Restrictions Weight Bearing Restrictions: No      Mobility  Bed Mobility Overal bed mobility: Needs Assistance Bed Mobility: Supine to Sit, Sit to Supine     Supine to sit: Supervision Sit to supine: Supervision        Transfers Overall transfer level: Modified independent Equipment used: 1 person hand held assist                    Ambulation/Gait Ambulation/Gait assistance: Supervision Gait Distance (Feet): 150 Feet Assistive device: IV Pole Gait Pattern/deviations: Step-through pattern, Decreased stride length, Wide base of support, Trunk flexed Gait velocity: reduced Gait velocity interpretation: <1.31 ft/sec, indicative of household ambulator Pre-gait activities: standing postural ck and BP ck General Gait Details: postural shift over his tight hip flexors  Stairs            Wheelchair Mobility    Modified Rankin (Stroke Patients Only)       Balance Overall balance assessment: Needs assistance Sitting-balance support: Feet supported Sitting balance-Leahy Scale: Good     Standing balance support: No upper extremity supported Standing balance-Leahy Scale: Fair                               Pertinent Vitals/Pain Pain Assessment Pain Assessment: Faces Faces Pain Scale: Hurts little more Pain Location: back and RLE chronically Pain Descriptors / Indicators: Tender, Guarding Pain Intervention(s): Monitored during session, Repositioned    Home Living Family/patient expects to be discharged to:: Private residence Living Arrangements: Spouse/significant other Available Help at  Discharge: Family;Available 24 hours/day Type of Home: House Home Access: Stairs to enter       Home Layout: One level   Additional Comments: Pt has been outside doing work all day, lifting and working on gardening, no fall history    Prior Function Prior Level of Function : Independent/Modified Independent             Mobility Comments:  gardening all day       Hand Dominance   Dominant Hand: Right    Extremity/Trunk Assessment   Upper Extremity Assessment Upper Extremity Assessment: Overall WFL for tasks assessed    Lower Extremity Assessment Lower Extremity Assessment: Overall WFL for tasks assessed    Cervical / Trunk Assessment Cervical / Trunk Assessment: Kyphotic;Other exceptions (chronic back pain) Cervical / Trunk Exceptions: has tight hip flexors  Communication   Communication: No difficulties  Cognition Arousal/Alertness: Awake/alert Behavior During Therapy: WFL for tasks assessed/performed Overall Cognitive Status: Within Functional Limits for tasks assessed                                 General Comments: very active at home, vegan diet and monitoring his BP        General Comments General comments (skin integrity, edema, etc.): pt was assisted to get to BR and then down the hall, no low BP readings were found    Exercises     Assessment/Plan    PT Assessment Patient needs continued PT services  PT Problem List Decreased range of motion;Cardiopulmonary status limiting activity       PT Treatment Interventions DME instruction;Gait training;Stair training;Functional mobility training;Therapeutic activities;Therapeutic exercise;Balance training;Neuromuscular re-education;Patient/family education    PT Goals (Current goals can be found in the Care Plan section)  Acute Rehab PT Goals Patient Stated Goal: to get home and recover strength of endurance PT Goal Formulation: With patient Time For Goal Achievement: 02/01/22 Potential to Achieve Goals: Good    Frequency Min 3X/week     Co-evaluation               AM-PAC PT "6 Clicks" Mobility  Outcome Measure Help needed turning from your back to your side while in a flat bed without using bedrails?: None Help needed moving from lying on your back to sitting on the side of a flat bed without using bedrails?:  None Help needed moving to and from a bed to a chair (including a wheelchair)?: A Little Help needed standing up from a chair using your arms (e.g., wheelchair or bedside chair)?: A Little Help needed to walk in hospital room?: A Little Help needed climbing 3-5 steps with a railing? : A Little 6 Click Score: 20    End of Session Equipment Utilized During Treatment: Gait belt Activity Tolerance: Patient tolerated treatment well Patient left: in bed;with call bell/phone within reach Nurse Communication: Mobility status PT Visit Diagnosis: Pain Pain - Right/Left:  (back) Pain - part of body:  (back)    Time: 3614-4315 PT Time Calculation (min) (ACUTE ONLY): 33 min   Charges:   PT Evaluation $PT Eval Moderate Complexity: 1 Mod PT Treatments $Gait Training: 8-22 mins       Ramond Dial 01/25/2022, 1:14 PM  Mee Hives, PT PhD Acute Rehab Dept. Number: [place] and [place]

## 2022-01-25 NOTE — Assessment & Plan Note (Signed)
-   We will continue statin therapy. 

## 2022-01-25 NOTE — Progress Notes (Signed)
OT Cancellation Note  Patient Details Name: [name] MRN: [id number] DOB: [birth year]   Cancelled Treatment:    Reason Eval/Treat Not Completed: OT screened, no needs identified, will sign off Consulted with pt and nursing - pt independent with daily tasks and did fairly well with PT today. Pt reports his only issue is adequate control of his BP at night. No formal OT eval needed.  Layla Maw 01/25/2022, 1:47 PM

## 2022-01-25 NOTE — Assessment & Plan Note (Signed)
-   We will continue his antihypertensives. 

## 2022-01-25 NOTE — Plan of Care (Signed)
  Problem: Activity: Goal: Risk for activity intolerance will decrease Outcome: Progressing   Problem: Coping: Goal: Level of anxiety will decrease Outcome: Progressing   Problem: Safety: Goal: Ability to remain free from injury will improve Outcome: Progressing   

## 2022-01-25 NOTE — Plan of Care (Signed)
  Problem: Clinical Measurements: Goal: Ability to maintain clinical measurements within normal limits will improve Outcome: Progressing Goal: Diagnostic test results will improve Outcome: Progressing   Problem: Activity: Goal: Risk for activity intolerance will decrease 01/25/2022 2131 by Barton Dubois, RN Outcome: Progressing 01/25/2022 2130 by Barton Dubois, RN Outcome: Progressing   Problem: Coping: Goal: Level of anxiety will decrease Outcome: Progressing   Problem: Safety: Goal: Ability to remain free from injury will improve Outcome: Progressing

## 2022-01-26 ENCOUNTER — Other Ambulatory Visit: Payer: Self-pay

## 2022-01-26 ENCOUNTER — Other Ambulatory Visit (HOSPITAL_COMMUNITY): Payer: Self-pay

## 2022-01-26 DIAGNOSIS — R42 Dizziness and giddiness: Secondary | ICD-10-CM

## 2022-01-26 DIAGNOSIS — R55 Syncope and collapse: Secondary | ICD-10-CM | POA: Diagnosis not present

## 2022-01-26 DIAGNOSIS — I25708 Atherosclerosis of coronary artery bypass graft(s), unspecified, with other forms of angina pectoris: Secondary | ICD-10-CM

## 2022-01-26 DIAGNOSIS — I10 Essential (primary) hypertension: Secondary | ICD-10-CM

## 2022-01-26 DIAGNOSIS — J449 Chronic obstructive pulmonary disease, unspecified: Secondary | ICD-10-CM

## 2022-01-26 LAB — CBC WITH DIFFERENTIAL/PLATELET
Abs Immature Granulocytes: 0.01 10*3/uL (ref 0.00–0.07)
Basophils Absolute: 0 10*3/uL (ref 0.0–0.1)
Basophils Relative: 0 %
Eosinophils Absolute: 0 10*3/uL (ref 0.0–0.5)
Eosinophils Relative: 0 %
HCT: 29.5 % — ABNORMAL LOW (ref 39.0–52.0)
Hemoglobin: 9.9 g/dL — ABNORMAL LOW (ref 13.0–17.0)
Immature Granulocytes: 0 %
Lymphocytes Relative: 14 %
Lymphs Abs: 0.8 10*3/uL (ref 0.7–4.0)
MCH: 27 pg (ref 26.0–34.0)
MCHC: 33.6 g/dL (ref 30.0–36.0)
MCV: 80.4 fL (ref 80.0–100.0)
Monocytes Absolute: 0.2 10*3/uL (ref 0.1–1.0)
Monocytes Relative: 4 %
Neutro Abs: 4.5 10*3/uL (ref 1.7–7.7)
Neutrophils Relative %: 82 %
Platelets: 266 10*3/uL (ref 150–400)
RBC: 3.67 MIL/uL — ABNORMAL LOW (ref 4.22–5.81)
RDW: 17.8 % — ABNORMAL HIGH (ref 11.5–15.5)
WBC: 5.5 10*3/uL (ref 4.0–10.5)
nRBC: 0 % (ref 0.0–0.2)

## 2022-01-26 LAB — BASIC METABOLIC PANEL
Anion gap: 10 (ref 5–15)
BUN: 7 mg/dL — ABNORMAL LOW (ref 8–23)
CO2: 21 mmol/L — ABNORMAL LOW (ref 22–32)
Calcium: 9.3 mg/dL (ref 8.9–10.3)
Chloride: 102 mmol/L (ref 98–111)
Creatinine, Ser: 0.94 mg/dL (ref 0.61–1.24)
GFR, Estimated: >60 mL/min (ref >60)
Glucose, Bld: 144 mg/dL — ABNORMAL HIGH (ref 70–99)
Potassium: 4.4 mmol/L (ref 3.5–5.1)
Sodium: 133 mmol/L — ABNORMAL LOW (ref 135–145)

## 2022-01-26 LAB — CORTISOL: Cortisol, Plasma: 1.2 ug/dL

## 2022-01-26 LAB — MAGNESIUM: Magnesium: 2.1 mg/dL (ref 1.7–2.4)

## 2022-01-26 MED ORDER — DEXAMETHASONE 6 MG PO TABS
6.0000 mg | ORAL_TABLET | Freq: Every day | ORAL | 0 refills | Status: DC
  Filled 2022-01-26: qty 30, 30d supply, fill #0

## 2022-01-26 NOTE — Consult Note (Signed)
   [facility] CM Inpatient Consult   01/26/2022  ROBBEN JAGIELLO 1950-10-08 [id number]  Late entry for 1250 pm:  [employer] [place] [employer] [ACO] Patient: Medicare ACO REACH  Primary Care Provider:  Venia Carbon, MD, [place] [facility]   Patient evaluated for community based care coordination services with [place] [facility] as a benefit of patient's [employer]. Spoke with patient at bedside to explain [place] [facility] services.   Met with the patient at the bedside regarding [facility] benefits for in network. Patient states he was doing a lot better and has some medication changes. His main concern was for his Advanced Directives and to update his living will.  Plan:  Patient will receive post hospital discharge call and for assessments fo community needs for care/disease management.  Referral made for both.  Reached out to [facility] Advanced Directive Advocate and information received this afternoon. Will have information for the patient.   Of note, [facility] Care Management services does not replace or interfere with any services that are arranged by inpatient Transition of Care [TOC] team     Natividad Brood, RN BSN [place]  [phone] business mobile phone Toll free office [phone]  *[place]  [phone] Fax number: [phone] [email or web address]

## 2022-01-26 NOTE — TOC Transition Note (Addendum)
Transition of Care [facility]) - CM/SW Discharge Note   Patient Details  Name: [name] MRN: [id number] Date of Birth: 1950-06-22  [facility] CM/SW Contact:  Zenon Mayo, RN Phone Number: 01/26/2022, 8:57 AM   Clinical Narrative:    Patient is for dc today, he has no needs at dc. He states his DIL will transport him home.  TOC to fill meds.         Patient Goals and CMS Choice        Discharge Placement                       Discharge Plan and Services                                     Social Determinants of Health (SDOH) Interventions     Readmission Risk Interventions     No data to display

## 2022-01-26 NOTE — Discharge Summary (Signed)
[name] TLX:[id number] DOB: 02-16-1951 DOA: 01/24/2022  PCP: Venia Carbon, MD  Admit date: 01/24/2022  Discharge date: 01/26/2022  Admitted From: Home   Disposition:  Home   Recommendations for Outpatient Follow-up:   Follow up with PCP in 1-2 weeks  PCP Please obtain BMP/CBC, 2 view CXR in 1week,  (see Discharge instructions)   PCP Please follow up on the following pending results: Kindly monitor blood pressure closely, needs outpatient endocrine follow-up and cosyntropin stimulation test.  Will require outpatient endocrine, cardiology and cardiothoracic surgery follow-up within 1 to 2 weeks of discharge.   Home Health: None   Equipment/Devices: as below  Consultations: None  Discharge Condition: Stable    CODE STATUS: Full    Diet Recommendation: Heart Healthy     Chief Complaint  Patient presents with   Dizziness     Brief history of present illness from the day of admission and additional interim summary    71 y.o. male with medical history significant for alcohol abuse, coronary artery disease, cardiomyopathy, GERD, hypertension, dyslipidemia, and PAD who presented to the emergency room with dizziness associated with low blood pressure in the evenings ongoing for several weeks.  With these episodes he sometimes gets some tingling numbness in his arms.  No focal weakness or headache, in the ER he was found to be hypotensive and dehydrated was admitted to the hospital.                                                                 Hospital Course   Dizziness associated with episodes of extreme hypotension in the evenings ongoing now for several weeks. He has been experiencing drop in blood pressure in the evening hours to the tune of systolic being in 74B, his blood pressure is usually fine in  the morning, this has been going on for several weeks.  His presentation was highly suspicious for adrenal insufficiency, random cortisol when he was hypotensive and early in the morning was under 2, he was placed on Decadron after which his symptoms have resolved and blood pressures have stabilized.  He is very eager to go home and he is currently symptom-free he will be placed on 6 mg of oral Decadron requested to follow-up with his PCP within a week of discharge.  Request PCP to arrange for outpatient endocrine and cosyntropin stimulation test.  I am giving him 30-day supply of Decadron.   Hydration with hypokalemia and hyponatremia -hydrated and electrolytes provided.  Stable.   Dyslipidemia - We will continue statin therapy.   Coronary artery disease  - We will continue Coreg, ARB, Ranexa, statin therapy and Zetia as well as beta-blocker therapy, aspirin and Plavix.  Echo noted.   Essential hypertension -continue home medications.   B12 deficiency.  Check and replace.  Chronic systolic heart failure with wall motion abnormalities.  Noted echocardiogram with EF has dropped to 5% from 40%, patient is clinically compensated, offered him a cardiology evaluation but he really wants to go home and follow-up with his primary cardiologist Dr. Rockey Situ, requested to follow-up with his cardiologist within a week.  Continue Coreg and ARB combination for now.  Thoracic aortic aneurysm.  Continue beta-blocker follow-up with cardiothoracic surgery within a month of discharge.  Request PCP to arrange for it.   Discharge diagnosis     Principal Problem:   Postural dizziness with presyncope Active Problems:   Hypokalemia   Hyponatremia   Dyslipidemia   Coronary artery disease   Essential hypertension    Discharge instructions    Discharge Instructions     Diet - low sodium heart healthy   Complete by: As directed    Discharge instructions   Complete by: As directed    Follow with Primary MD  Venia Carbon, MD in 7 days   Get CBC, CMP, Cortisol, TSH -  checked next visit within 1 week by Primary MD   Activity: As tolerated with Full fall precautions use walker/cane & assistance as needed  Disposition Home    Diet: Heart Healthy    Special Instructions: If you have smoked or chewed Tobacco  in the last 2 yrs please stop smoking, stop any regular Alcohol  and or any Recreational drug use.  On your next visit with your primary care physician please Get Medicines reviewed and adjusted.  Please request your Prim.MD to go over all Hospital Tests and Procedure/Radiological results at the follow up, please get all Hospital records sent to your Prim MD by signing hospital release before you go home.  If you experience worsening of your admission symptoms, develop shortness of breath, life threatening emergency, suicidal or homicidal thoughts you must seek medical attention immediately by calling 911 or calling your MD immediately  if symptoms less severe.  You Must read complete instructions/literature along with all the possible adverse reactions/side effects for all the Medicines you take and that have been prescribed to you. Take any new Medicines after you have completely understood and accpet all the possible adverse reactions/side effects.   For home use only DME 4 wheeled rolling walker with seat   Complete by: As directed    Patient needs a walker to treat with the following condition: Weakness   Increase activity slowly   Complete by: As directed        Discharge Medications   Allergies as of 01/26/2022       Reactions   Metoprolol Tartrate Hives, Itching   Quinolones Other (See Comments)   Contraindicated with AAA   Metoclopramide Nausea Only   Other reaction(s): Unknown        Medication List     STOP taking these medications    tiZANidine 4 MG tablet Commonly known as: ZANAFLEX       TAKE these medications    aspirin 81 MG tablet Take 81 mg by  mouth daily.   carvedilol 3.125 MG tablet Commonly known as: COREG TAKE 1 TABLET (3.125 MG TOTAL) BY MOUTH 2 (TWO) TIMES DAILY. What changed: when to take this   clopidogrel 75 MG tablet Commonly known as: PLAVIX TAKE 1 TABLET BY MOUTH EVERY DAY   cyanocobalamin 1000 MCG tablet Commonly known as: VITAMIN B12 Take 1,000 mcg by mouth daily.   dexamethasone 6 MG tablet Commonly known as: DECADRON Take 1 tablet (6  mg total) by mouth daily.   ezetimibe 10 MG tablet Commonly known as: ZETIA TAKE 1 TABLET (10 MG TOTAL) BY MOUTH DAILY.   gabapentin 100 MG capsule Commonly known as: NEURONTIN Take 1 capsule (100 mg total) by mouth 2 (two) times daily.   losartan 25 MG tablet Commonly known as: COZAAR TAKE 1/2 TABLET BY MOUTH EVERY DAY   mirtazapine 15 MG tablet Commonly known as: REMERON TAKE 1 TABLET BY MOUTH EVERYDAY AT BEDTIME What changed: See the new instructions.   nitroGLYCERIN 0.4 MG SL tablet Commonly known as: NITROSTAT PLACE 1 TABLET UNDER THE TONGUE EVERY 5 MINUTES AS NEEDED FOR CHEST PAIN. What changed: See the new instructions.   pantoprazole 40 MG tablet Commonly known as: PROTONIX Take 1 tablet (40 mg total) by mouth 2 (two) times daily.   ranolazine 500 MG 12 hr tablet Commonly known as: RANEXA Take 1 tablet (500 mg total) by mouth 2 (two) times daily.   rosuvastatin 40 MG tablet Commonly known as: CRESTOR TAKE 1 TABLET BY MOUTH EVERY DAY               Durable Medical Equipment  (From admission, onward)           Start     Ordered   01/26/22 0000  For home use only DME 4 wheeled rolling walker with seat       Question:  Patient needs a walker to treat with the following condition  Answer:  Weakness   01/26/22 0849             Follow-up Information     Venia Carbon, MD. Schedule an appointment as soon as possible for a visit in 1 week(s).   Specialties: Internal Medicine, Pediatrics Contact information: [place] [address] [phone]         Minna Merritts, MD. Schedule an appointment as soon as possible for a visit in 1 week(s).   Specialty: Cardiology Contact information: [address] [place] [address] [phone]                 Major procedures and Radiology Reports - PLEASE review detailed and final reports thoroughly  -     ECHOCARDIOGRAM COMPLETE  Result Date: 01/25/2022    ECHOCARDIOGRAM REPORT   Patient Name:   JAYREN CEASE Date of Exam: 01/25/2022 Medical Rec #:  [id number]        Height:       63.0 in Accession #:    [id number]       Weight:       161.0 lb Date of Birth:  Jun 20, 1950        BSA:          1.763 m Patient Age:    71 years         BP:           132/79 mmHg Patient Gender: M                HR:           60 bpm. Exam Location:  Inpatient Procedure: 2D Echo, Color Doppler, Cardiac Doppler and Intracardiac            Opacification Agent Indications:    Syncope  History:        Patient has prior history of Echocardiogram examinations, most  recent 05/14/2021. Cardiomyopathy, CAD; Risk Factors:Dyslipidemia                 and Hypertension.  Sonographer:    Memory Argue Referring Phys: Graylin Shiver [place]  1. Left ventricular ejection fraction, by estimation, is 30 to 35%. The left ventricle has moderately decreased function. The left ventricle demonstrates regional wall motion abnormalities (see scoring diagram/findings for description). The left ventricular internal cavity size was mildly dilated. Left ventricular diastolic parameters are consistent with Grade I diastolic dysfunction (impaired relaxation).  2. Right ventricular systolic function is moderately reduced. The right ventricular size is normal. Tricuspid regurgitation signal is inadequate for assessing PA pressure.  3. The mitral valve is normal in structure. Trivial mitral valve regurgitation.  4. The aortic valve is tricuspid. Aortic valve  regurgitation is not visualized. Aortic valve sclerosis/calcification is present, without any evidence of aortic stenosis.  5. The inferior vena cava is normal in size with greater than 50% respiratory variability, suggesting right atrial pressure of 3 mmHg. FINDINGS  Left Ventricle: Left ventricular ejection fraction, by estimation, is 30 to 35%. The left ventricle has moderately decreased function. The left ventricle demonstrates regional wall motion abnormalities. The left ventricular internal cavity size was mildly dilated. There is no left ventricular hypertrophy. Left ventricular diastolic parameters are consistent with Grade I diastolic dysfunction (impaired relaxation).  LV Wall Scoring: The anterior septum, inferior wall, posterior wall, mid inferoseptal segment, and basal inferoseptal segment are akinetic. The entire anterior wall, antero-lateral wall, and entire apex are normal. Right Ventricle: The right ventricular size is normal. No increase in right ventricular wall thickness. Right ventricular systolic function is moderately reduced. Tricuspid regurgitation signal is inadequate for assessing PA pressure. Left Atrium: Left atrial size was normal in size. Right Atrium: Right atrial size was normal in size. Pericardium: There is no evidence of pericardial effusion. Mitral Valve: The mitral valve is normal in structure. Trivial mitral valve regurgitation. Tricuspid Valve: The tricuspid valve is normal in structure. Tricuspid valve regurgitation is trivial. Aortic Valve: The aortic valve is tricuspid. Aortic valve regurgitation is not visualized. Aortic valve sclerosis/calcification is present, without any evidence of aortic stenosis. Aortic valve mean gradient measures 2.0 mmHg. Aortic valve peak gradient measures 3.5 mmHg. Aortic valve area, by VTI measures 2.55 cm. Pulmonic Valve: The pulmonic valve was not well visualized. Pulmonic valve regurgitation is not visualized. Aorta: The aortic root and  ascending aorta are structurally normal, with no evidence of dilitation. Venous: The inferior vena cava is normal in size with greater than 50% respiratory variability, suggesting right atrial pressure of 3 mmHg. IAS/Shunts: The interatrial septum was not well visualized.  LEFT VENTRICLE PLAX 2D LVIDd:         5.70 cm      Diastology LVIDs:         5.00 cm      LV e' medial:    5.33 cm/s LV PW:         0.90 cm      LV E/e' medial:  14.0 LV IVS:        0.90 cm      LV e' lateral:   8.16 cm/s LVOT diam:     2.00 cm      LV E/e' lateral: 9.1 LV SV:         52 LV SV Index:   29 LVOT Area:     3.14 cm  LV Volumes (MOD) LV vol d, MOD A2C: 137.0 ml  LV vol d, MOD A4C: 119.5 ml LV vol s, MOD A2C: 80.5 ml LV vol s, MOD A4C: 81.7 ml LV SV MOD A2C:     56.5 ml LV SV MOD A4C:     119.5 ml LV SV MOD BP:      47.3 ml RIGHT VENTRICLE RV S prime:     5.66 cm/s TAPSE (M-mode): 1.1 cm LEFT ATRIUM             Index        RIGHT ATRIUM          Index LA diam:        3.30 cm 1.87 cm/m   RA Area:     9.90 cm LA Vol (A2C):   42.6 ml 24.16 ml/m  RA Volume:   20.10 ml 11.40 ml/m LA Vol (A4C):   57.2 ml 32.44 ml/m LA Biplane Vol: 50.7 ml 28.75 ml/m  AORTIC VALVE AV Area (Vmax):    2.73 cm AV Area (Vmean):   2.55 cm AV Area (VTI):     2.55 cm AV Vmax:           93.00 cm/s AV Vmean:          61.100 cm/s AV VTI:            0.203 m AV Peak Grad:      3.5 mmHg AV Mean Grad:      2.0 mmHg LVOT Vmax:         80.80 cm/s LVOT Vmean:        49.600 cm/s LVOT VTI:          0.165 m LVOT/AV VTI ratio: 0.81  AORTA Ao Root diam: 3.40 cm Ao Asc diam:  3.40 cm MITRAL VALVE MV Area (PHT): 4.15 cm    SHUNTS MV Decel Time: 183 msec    Systemic VTI:  0.16 m MR Peak grad: 61.2 mmHg    Systemic Diam: 2.00 cm MR Vmax:      391.00 cm/s MV E velocity: 74.60 cm/s MV A velocity: 64.90 cm/s MV E/A ratio:  1.15 Oswaldo Milian MD Electronically signed by Oswaldo Milian MD Signature Date/Time: 01/25/2022/11:32:05 AM    Final    CT Angio Chest/Abd/Pel  for Dissection W and/or Wo Contrast  Result Date: 01/24/2022 CLINICAL DATA:  Acute aortic syndrome (AAS) suspected hx of AAA r/o rupture vs dissection EXAM: CT ANGIOGRAPHY CHEST, ABDOMEN AND PELVIS TECHNIQUE: Non-contrast CT of the chest was initially obtained. Multidetector CT imaging through the chest, abdomen and pelvis was performed using the standard protocol during bolus administration of intravenous contrast. Multiplanar reconstructed images and MIPs were obtained and reviewed to evaluate the vascular anatomy. RADIATION DOSE REDUCTION: This exam was performed according to the departmental dose-optimization program which includes automated exposure control, adjustment of the mA and/or kV according to patient size and/or use of iterative reconstruction technique. CONTRAST:  143m OMNIPAQUE IOHEXOL 350 MG/ML SOLN COMPARISON:  None Available. FINDINGS: CTA CHEST FINDINGS Cardiovascular: Preferential opacification of the thoracic aorta. No evidence of thoracic aortic aneurysm or dissection. Prominent heart size. No significant pericardial effusion. Severe atherosclerotic plaque of the thoracic aorta. Four-vessel coronary artery calcifications status post coronary artery bypass graft. Right coronary artery stent. The main pulmonary artery is normal in caliber. No pulmonary embolus. Mediastinum/Nodes: No enlarged mediastinal, hilar, or axillary lymph nodes. Thyroid gland, trachea, and esophagus demonstrate no significant findings. Lungs/Pleura: Bilateral lower lobe subsegmental atelectasis. No focal consolidation. No pulmonary nodule. No pulmonary mass. No pleural effusion.  No pneumothorax. Musculoskeletal: No chest wall abnormality. No suspicious lytic or blastic osseous lesions. No acute displaced fracture. Multilevel degenerative changes of the spine. Review of the MIP images confirms the above findings. CTA ABDOMEN AND PELVIS FINDINGS VASCULAR Aorta: Severe atherosclerotic plaque. Interval increase in size of  a 4.4 x 4.5 cm (from 3.6 cm in 2016) infrarenal abdominal aorta aneurysm extending approximately 5 cm in the craniocaudal dimension down to the aortic bifurcation. Celiac: Replaced left gastric artery with origin off of the aorta a just proximal to the celiac artery origin. Patent without evidence of aneurysm, dissection, vasculitis or significant stenosis. 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 But sick a shin distally is noted. No aneurysm, dissection, vasculitis or significant stenosis. Renals: Both renal arteries are patent without evidence of aneurysm, dissection, vasculitis, fibromuscular dysplasia or significant stenosis. IMA: Patent without evidence of aneurysm, dissection, vasculitis or significant stenosis. Inflow: Patent without evidence of aneurysm, dissection, vasculitis or significant stenosis. Veins: No obvious venous abnormality within the limitations of this arterial phase study. Review of the MIP images confirms the above findings. NON-VASCULAR Hepatobiliary: No focal liver abnormality. Calcified gallstone noted within the gallbladder lumen. No gallbladder wall thickening or pericholecystic fluid. No biliary dilatation. Pancreas: No focal lesion. Normal pancreatic contour. No surrounding inflammatory changes. No main pancreatic ductal dilatation. Spleen: Normal in size without focal abnormality. Adrenals/Urinary Tract: No adrenal nodule bilaterally. Bilateral kidneys enhance symmetrically. No hydronephrosis. No hydroureter. The urinary bladder is unremarkable. Stomach/Bowel: Stomach is within normal limits. No evidence of bowel wall thickening or dilatation. Colonic diverticulosis. Appendix appears normal. Lymphatic: No lymphadenopathy. Reproductive: Prostate is unremarkable. Other: No intraperitoneal free fluid. No intraperitoneal free gas. No organized fluid collection. Musculoskeletal: No abdominal wall hernia or abnormality. No suspicious  lytic or blastic osseous lesions. No acute displaced fracture. Stable chronic anterior wedge compression fracture of the L1 vertebral body . Intervertebral disc space vacuum phenomenon. Review of the MIP images confirms the above findings. IMPRESSION: 1. Interval increase in size of a 4.4 x 4.5 cm (from 3.6 cm in 2016) infrarenal abdominal aorta aneurysm. Recommend follow-up CT/MR every 6 months and vascular consultation. This recommendation follows ACR consensus guidelines: White Paper of the ACR Incidental Findings Committee II on Vascular Findings. J Am Coll Radiol 2013; 10:789-794. 2. No pulmonary embolus. 3. Aortic Atherosclerosis (ICD10-I70.0) - severe with four-vessel coronary calcification status post coronary artery bypass graft. 4. 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 5. Cholelithiasis with no CT finding of acute cholecystitis. 6. Colonic diverticulosis with no acute diverticulitis. Electronically Signed   By: Iven Finn M.D.   On: 01/24/2022 23:34   CT HEAD WO CONTRAST (5MM)  Result Date: 01/24/2022 CLINICAL DATA:  Neuro deficit, acute, stroke suspected EXAM: CT HEAD WITHOUT CONTRAST TECHNIQUE: Contiguous axial images were obtained from the base of the skull through the vertex without intravenous contrast. RADIATION DOSE REDUCTION: This exam was performed according to the departmental dose-optimization program which includes automated exposure control, adjustment of the mA and/or kV according to patient size and/or use of iterative reconstruction technique. COMPARISON:  None Available. BRAIN: BRAIN Cerebral ventricle sizes are concordant with the degree of cerebral volume loss. Patchy and confluent areas of decreased attenuation are noted throughout the deep and periventricular white matter of the cerebral hemispheres bilaterally, compatible with chronic microvascular ischemic disease. No evidence of large-territorial acute infarction. No  parenchymal hemorrhage. No mass lesion. No extra-axial collection. No mass effect or  midline shift. No hydrocephalus. Basilar cisterns are patent. Vascular: No hyperdense vessel. Atherosclerotic calcifications are present within the cavernous internal carotid arteries. Skull: No acute fracture or focal lesion. Sinuses/Orbits: Paranasal sinuses and mastoid air cells are clear. The orbits are unremarkable. Other: None. IMPRESSION: No acute intracranial abnormality. Electronically Signed   By: Iven Finn M.D.   On: 01/24/2022 23:17   DG Chest Port 1 View  Result Date: 01/24/2022 CLINICAL DATA:  Hypotension EXAM: PORTABLE CHEST 1 VIEW COMPARISON:  05/13/2021 FINDINGS: Lungs are clear.  No pleural effusion or pneumothorax. The heart is normal in size. Postsurgical changes related to prior CABG. Median sternotomy. IMPRESSION: No evidence of acute cardiopulmonary disease. Electronically Signed   By: Julian Hy M.D.   On: 01/24/2022 21:58    Micro Results    No results found for this or any previous visit (from the past 240 hour(s)).  Today   Subjective    [name] today has no headache,no chest abdominal pain,no new weakness tingling or numbness, feels much better wants to go home today.    Objective   Blood pressure 122/71, pulse 63, temperature 97.9 F (36.6 C), temperature source Oral, resp. rate 14, height '5\' 3"'$  (1.6 m), weight 76.1 kg, SpO2 96 %.   Intake/Output Summary (Last 24 hours) at 01/26/2022 0857 Last data filed at 01/26/2022 0500 Gross per 24 hour  Intake 1240 ml  Output --  Net 1240 ml    Exam  Awake Alert, No new F.N deficits,    [place].AT,PERRAL Supple Neck,   Symmetrical Chest wall movement, Good air movement bilaterally, CTAB RRR,No Gallops,   +ve B.Sounds, Abd Soft, Non tender,  No Cyanosis, Clubbing or edema    Data Review   Recent Labs  Lab 01/24/22 2210 01/24/22 2230 01/25/22 0421 01/26/22 0040  WBC 6.8  --  5.0 5.5  HGB 10.5* 11.2* 9.5*  9.9*  HCT 31.3* 33.0* 28.6* 29.5*  PLT 260  --  244 266  MCV 80.3  --  81.7 80.4  MCH 26.9  --  27.1 27.0  MCHC 33.5  --  33.2 33.6  RDW 17.6*  --  18.0* 17.8*  LYMPHSABS 2.0  --   --  0.8  MONOABS 0.9  --   --  0.2  EOSABS 0.3  --   --  0.0  BASOSABS 0.1  --   --  0.0    Recent Labs  Lab 01/24/22 2210 01/24/22 2230 01/25/22 0040 01/25/22 0421 01/25/22 1750 01/26/22 0040  NA 131* 133*  --  135  --  133*  K 3.0* 3.1*  --  4.1  --  4.4  CL 99 99  --  108  --  102  CO2 21*  --   --  22  --  21*  GLUCOSE 103* 96  --  97  --  144*  BUN 9 6*  --  6*  --  7*  CREATININE 1.10 1.00  --  1.00  --  0.94  CALCIUM 8.9  --   --  8.5*  --  9.3  AST 18  --   --   --   --   --   ALT 12  --   --   --   --   --   ALKPHOS 36*  --   --   --   --   --   BILITOT 0.7  --   --   --   --   --  ALBUMIN 3.5  --   --   --   --   --   MG  --   --  1.7  --   --  2.1  TSH  --   --   --   --  1.342  --     Total Time in preparing paper work, data evaluation and todays exam - 19 minutes  Lala Lund M.D on 01/26/2022 at 8:57 AM  [facility]

## 2022-01-26 NOTE — Progress Notes (Signed)
Physical Therapy Treatment Patient Details Name: [name] MRN: [id number] DOB: [birth year] Today's Date: 01/26/2022   History of Present Illness Pt is a 71 y.o. male admitted 01/24/22 with dizziness associated with hypotension over the past several weeks. Pt also noted with dehydration, hypokalemia, hyponatremia. Found to have interval increase in size of infrarenal abdominal aortic aneursym. PMH includes CAD, cardiomyopathy, GERD, hypotension, PAD.   PT Comments    Pt progressing with mobility, independent performing ADL tasks and ambulating without DME. Reviewed educ re: activity recommendations, importance of mobility. Pt has met short-term acute PT goals, reports no further questions or concerns. Do not feel pt requires follow-up with OPPT services, pt in agreement; CM notified. Will sign off acute PT.  Resting BP 117/66 Post-ambulation BP 145/85 SpO2 100% on RA    Recommendations for follow up therapy are one component of a multi-disciplinary discharge planning process, led by the attending physician.  Recommendations may be updated based on patient status, additional functional criteria and insurance authorization.  Follow Up Recommendations  No PT follow up     Assistance Recommended at Discharge PRN  Patient can return home with the following  N/A   Equipment Recommendations  None recommended by PT    Recommendations for Other Services       Precautions / Restrictions Precautions Precautions: None Restrictions Weight Bearing Restrictions: No     Mobility  Bed Mobility Overal bed mobility: Modified Independent             General bed mobility comments: HOB elevated    Transfers Overall transfer level: Independent Equipment used: None                    Ambulation/Gait Ambulation/Gait assistance: Independent Gait Distance (Feet): 500 Feet Assistive device: None Gait Pattern/deviations: Step-through pattern, Decreased stride length, Wide  base of support Gait velocity: Decreased     General Gait Details: slow, steady gait indep without DME   Stairs             Wheelchair Mobility    Modified Rankin (Stroke Patients Only)       Balance Overall balance assessment: No apparent balance deficits (not formally assessed) Sitting-balance support: No upper extremity supported, Feet supported Sitting balance-Leahy Scale: Good Sitting balance - Comments: indep to don shoes sitting EOB   Standing balance support: No upper extremity supported, During functional activity Standing balance-Leahy Scale: Good                              Cognition Arousal/Alertness: Awake/alert Behavior During Therapy: WFL for tasks assessed/performed Overall Cognitive Status: Within Functional Limits for tasks assessed                                          Exercises      General Comments General comments (skin integrity, edema, etc.): resting BP 117/66, post-ambulation BP 145/85; SpO2 100% on RA; HR 80s-90s      Pertinent Vitals/Pain Pain Assessment Pain Assessment: Faces Faces Pain Scale: Hurts a little bit Pain Location: back (chronic) Pain Descriptors / Indicators: Discomfort Pain Intervention(s): Monitored during session    Home Living                          Prior Function  PT Goals (current goals can now be found in the care plan section) Progress towards PT goals: Goals met/education completed, patient discharged from PT    Frequency    Min 3X/week      PT Plan Discharge plan needs to be updated    Co-evaluation              AM-PAC PT "6 Clicks" Mobility   Outcome Measure  Help needed turning from your back to your side while in a flat bed without using bedrails?: None Help needed moving from lying on your back to sitting on the side of a flat bed without using bedrails?: None Help needed moving to and from a bed to a chair (including a  wheelchair)?: None Help needed standing up from a chair using your arms (e.g., wheelchair or bedside chair)?: None Help needed to walk in hospital room?: None Help needed climbing 3-5 steps with a railing? : None 6 Click Score: 24    End of Session Equipment Utilized During Treatment: Gait belt Activity Tolerance: Patient tolerated treatment well Patient left: in bed;with call bell/phone within reach;with nursing/sitter in room (sitting EOB) Nurse Communication: Mobility status PT Visit Diagnosis: Other abnormalities of gait and mobility (R26.89)     Time: 0923-3007 PT Time Calculation (min) (ACUTE ONLY): 14 min  Charges:  $Self Care/Home Management: [place], PT, DPT Acute Rehabilitation Services  Personal: [place] [facility] Office: [place] 01/26/2022, 10:46 AM

## 2022-01-26 NOTE — Discharge Instructions (Signed)
Follow with Primary MD Venia Carbon, MD in 7 days   Get CBC, CMP, Cortisol, TSH -  checked next visit within 1 week by Primary MD   Activity: As tolerated with Full fall precautions use walker/cane & assistance as needed  Disposition Home    Diet: Heart Healthy    Special Instructions: If you have smoked or chewed Tobacco  in the last 2 yrs please stop smoking, stop any regular Alcohol  and or any Recreational drug use.  On your next visit with your primary care physician please Get Medicines reviewed and adjusted.  Please request your Prim.MD to go over all Hospital Tests and Procedure/Radiological results at the follow up, please get all Hospital records sent to your Prim MD by signing hospital release before you go home.  If you experience worsening of your admission symptoms, develop shortness of breath, life threatening emergency, suicidal or homicidal thoughts you must seek medical attention immediately by calling 911 or calling your MD immediately  if symptoms less severe.  You Must read complete instructions/literature along with all the possible adverse reactions/side effects for all the Medicines you take and that have been prescribed to you. Take any new Medicines after you have completely understood and accpet all the possible adverse reactions/side effects.

## 2022-01-27 ENCOUNTER — Other Ambulatory Visit: Payer: Self-pay | Admitting: Cardiovascular Disease

## 2022-01-27 ENCOUNTER — Telehealth: Payer: Self-pay | Admitting: *Deleted

## 2022-01-27 NOTE — Telephone Encounter (Signed)
Refill to pharmacy 

## 2022-01-27 NOTE — Chronic Care Management (AMB) (Signed)
  Care Coordination   Note   01/27/2022 Name: [name] MRN: [id number] DOB: [birth year]  [name] is a 71 y.o. year old male who sees Venia Carbon, MD for primary care. I reached out to [name] by phone today to offer care coordination services.  Mr. Knutzen was given information about Care Coordination services today including:   The Care Coordination services include support from the care team which includes your Nurse Coordinator, Clinical Social Worker, or Pharmacist.  The Care Coordination team is here to help remove barriers to the health concerns and goals most important to you. Care Coordination services are voluntary, and the patient may decline or stop services at any time by request to their care team member.   Care Coordination Consent Status: Patient agreed to services and verbal consent obtained.   Follow up plan:  Telephone appointment with care coordination team member scheduled for:  02/04/2022  Encounter Outcome:  Pt. Scheduled from referral   Julian Hy, [place] Direct Dial: [phone]

## 2022-01-27 NOTE — Patient Outreach (Signed)
  Care Coordination [facility] Note Transition Care Management Follow-up Telephone Call Date of discharge and from where: [phone] [facility] How have you been since you were released from the hospital? I feel good. I slept better than I had in a long time.  Any questions or concerns? No  Items Reviewed: Did the pt receive and understand the discharge instructions provided? Yes  Medications obtained and verified? Yes  Other? No  Any new allergies since your discharge? No  Dietary orders reviewed? Yes Low sodium heart healthy diet Do you have support at home? Yes   Home Care and Equipment/Supplies: Were home health services ordered? no If so, what is the name of the agency? N/a  Has the agency set up a time to come to the patient's home? not applicable Were any new equipment or medical supplies ordered?  No What is the name of the medical supply agency? N/a Were you able to get the supplies/equipment? no Do you have any questions related to the use of the equipment or supplies? No  Functional Questionnaire: (I = Independent and D = Dependent) ADLs: I  Bathing/Dressing- I  Meal Prep- I  Eating- I  Maintaining continence- I  Transferring/Ambulation- I  Managing Meds- I  Follow up appointments reviewed:  PCP Hospital f/u appt confirmed? Yes  Scheduled to see Dr Silvio Pate [phone] 9:00 AM . [facility] f/u appt confirmed? Yes  Scheduled to see Cadence Kathlen Mody [phone] 2:20  Are transportation arrangements needed? No  If their condition worsens, is the pt aware to call PCP or go to the Emergency Dept.? Yes Was the patient provided with contact information for the PCP's office or ED? Yes Was to pt encouraged to call back with questions or concerns? Yes  SDOH assessments and interventions completed:   Yes  Care Coordination Interventions Activated:  Yes   Care Coordination Interventions:  No Care Coordination interventions needed at this time.   Encounter Outcome:  Pt. Visit  Completed    [place] Management [phone]

## 2022-01-28 ENCOUNTER — Encounter: Payer: Self-pay | Admitting: Internal Medicine

## 2022-01-28 ENCOUNTER — Ambulatory Visit (INDEPENDENT_AMBULATORY_CARE_PROVIDER_SITE_OTHER): Payer: Medicare Other | Admitting: Internal Medicine

## 2022-01-28 VITALS — BP 110/78 | HR 52 | Temp 97.3°F | Ht 63.0 in | Wt 169.0 lb

## 2022-01-28 DIAGNOSIS — I5042 Chronic combined systolic (congestive) and diastolic (congestive) heart failure: Secondary | ICD-10-CM

## 2022-01-28 DIAGNOSIS — I255 Ischemic cardiomyopathy: Secondary | ICD-10-CM

## 2022-01-28 DIAGNOSIS — I25119 Atherosclerotic heart disease of native coronary artery with unspecified angina pectoris: Secondary | ICD-10-CM

## 2022-01-28 DIAGNOSIS — E2740 Unspecified adrenocortical insufficiency: Secondary | ICD-10-CM | POA: Diagnosis not present

## 2022-01-28 MED ORDER — HYDROCORTISONE 10 MG PO TABS: 10.0000 mg | ORAL_TABLET | Freq: Every day | ORAL | 3 refills | Status: DC

## 2022-01-28 NOTE — Progress Notes (Signed)
Subjective:    Patient ID: [name], male    DOB: 07/27/1950, 71 y.o.   MRN: [id number]  HPI Here for hospital follow up  He didn't "feel so good" Checked BP and was 52/31 Called EMS--they couldn't even get one  BP had been dropping at night---usually in the 90's Happened every few days for a month or so Did go to 70/30 once. Would go back up some on recheck Had been holding the evening carvedilol in general BP in day usually 120/70 or so  Wondered if the tizanidine was a cause Held that   No chest pain No SOB till yesterday afternoon---some labored breathing then and this morning (when taking garbage out and other chores)  Current Outpatient Medications on File Prior to Visit  Medication Sig Dispense Refill   aspirin 81 MG tablet Take 81 mg by mouth daily.     carvedilol (COREG) 3.125 MG tablet TAKE 1 TABLET (3.125 MG TOTAL) BY MOUTH 2 (TWO) TIMES DAILY. (Patient taking differently: Take 3.125 mg by mouth daily at 12 noon.) 180 tablet 3   clopidogrel (PLAVIX) 75 MG tablet TAKE 1 TABLET BY MOUTH EVERY DAY (Patient taking differently: Take 75 mg by mouth daily.) 90 tablet 2   cyanocobalamin (VITAMIN B12) 1000 MCG tablet Take 1,000 mcg by mouth daily.     dexamethasone (DECADRON) 6 MG tablet Take 1 tablet (6 mg total) by mouth daily. 30 tablet 0   ezetimibe (ZETIA) 10 MG tablet TAKE 1 TABLET (10 MG TOTAL) BY MOUTH DAILY. 90 tablet 1   gabapentin (NEURONTIN) 100 MG capsule Take 1 capsule (100 mg total) by mouth 2 (two) times daily. 180 capsule 3   losartan (COZAAR) 25 MG tablet Take 0.5 tablets (12.5 mg total) by mouth daily. 45 tablet 2   mirtazapine (REMERON) 15 MG tablet TAKE 1 TABLET BY MOUTH EVERYDAY AT BEDTIME (Patient taking differently: Take 15 mg by mouth at bedtime.) 90 tablet 3   nitroGLYCERIN (NITROSTAT) 0.4 MG SL tablet PLACE 1 TABLET UNDER THE TONGUE EVERY 5 MINUTES AS NEEDED FOR CHEST PAIN. (Patient taking differently: Place 0.4 mg under the tongue every 5  (five) minutes as needed for chest pain.) 25 tablet 2   pantoprazole (PROTONIX) 40 MG tablet Take 1 tablet (40 mg total) by mouth 2 (two) times daily. 180 tablet 3   ranolazine (RANEXA) 500 MG 12 hr tablet Take 1 tablet (500 mg total) by mouth 2 (two) times daily. 180 tablet 3   rosuvastatin (CRESTOR) 40 MG tablet TAKE 1 TABLET BY MOUTH EVERY DAY (Patient taking differently: Take 40 mg by mouth daily.) 90 tablet 3   No current facility-administered medications on file prior to visit.    Allergies  Allergen Reactions   Metoprolol Tartrate Hives and Itching   Quinolones Other (See Comments)    Contraindicated with AAA   Metoclopramide Nausea Only    Other reaction(s): Unknown    Past Medical History:  Diagnosis Date   AAA (abdominal aortic aneurysm) ([place])    a. Duplex 01/2012: stable infrarenal saccular AAA at 3.3cm x 3.2xm (f/u recommended 01/2013 per Dr. Rockey Situ)   Alcohol abuse    CAD (coronary artery disease)    a. 1995 s/p CABG x 4: LIMA->LAD, VG->RI (known to be occluded), VG->AM->PDA;  b. 05/2002 Inf STEMI: VG->AM->PDA 100% treated w/ 2 BMS complicated by acute thrombosis req 3 BMS;  c. 07/2003 DES to  LAD & LCX (VG's to PDA & RI 100%);  d. 12/2010 Acute  MI ([place]): DES to LCX & LM , LIMA ok,;  e.12/2011 Cath: LM/LCX stents ok , LIMA patent.   Cardiomyopathy ([place])    a. EF 35% by cath 2015.   Diverticulitis    1/06 Diverticulitis--CT of pelvis--diffuse sigmoid divertic   Dyspnea    GERD (gastroesophageal reflux disease)    Hyperlipidemia    Hypertension    Myocardial infarction [facility])    PAD (peripheral artery disease) ([place])    a. external iliac and mesenteric stenosis noted by noninvasive imaging.   Renal artery stenosis ([place])    a. 03/2011 PTA and stenting of L RA. b. last duplex 2016 with stable 1-59% bilateral RAS, incidental >50% R EIA stenosis    Past Surgical History:  Procedure Laterality Date   [place]     8/09  Cath--vein graft  occlusions which are old--no acute changes   CARDIAC CATHETERIZATION  11/13/2010   stent x 2 @ [place]   CARDIAC CATHETERIZATION  04/05/2014   stent placement    CARDIAC CATHETERIZATION  10/28/2017   CLOSED REDUCTION SHOULDER DISLOCATION     COLONOSCOPY WITH PROPOFOL N/A 12/22/2018   Procedure: COLONOSCOPY WITH PROPOFOL;  Surgeon: Jonathon Bellows, MD;  Location: [facility] ENDOSCOPY;  Service: Gastroenterology;  Laterality: N/A;   CORONARY ANGIOPLASTY  04/05/2014   stent placement OM 1   CORONARY ARTERY BYPASS GRAFT     CORONARY STENT PLACEMENT  7/12   2 stents--Promus element plus (everolimus eluting)--[facility] in [place] WIRE/FFR STUDY N/A 05/13/2021   Procedure: INTRAVASCULAR PRESSURE WIRE/FFR STUDY;  Surgeon: Early Osmond, MD;  Location: [place] CV LAB;  Service: Cardiovascular;  Laterality: N/A;   LEFT HEART CATH AND CORONARY ANGIOGRAPHY N/A 05/13/2021   Procedure: LEFT HEART CATH AND CORONARY ANGIOGRAPHY;  Surgeon: Early Osmond, MD;  Location: [place] CV LAB;  Service: Cardiovascular;  Laterality: N/A;   LEFT HEART CATH AND CORS/GRAFTS ANGIOGRAPHY N/A 10/28/2017   Procedure: LEFT HEART CATH AND CORS/GRAFTS ANGIOGRAPHY;  Surgeon: Leonie Man, MD;  Location: [place] CV LAB;  Service: Cardiovascular;  Laterality: N/A;   LEFT HEART CATHETERIZATION WITH CORONARY/GRAFT ANGIOGRAM N/A 01/04/2012   Procedure: LEFT HEART CATHETERIZATION WITH Beatrix Fetters;  Surgeon: Burnell Blanks, MD;  Location: [facility] CATH LAB;  Service: Cardiovascular;  Laterality: N/A;   LEFT HEART CATHETERIZATION WITH CORONARY/GRAFT ANGIOGRAM N/A 04/05/2014   Procedure: LEFT HEART CATHETERIZATION WITH Beatrix Fetters;  Surgeon: Jettie Booze, MD;  Location: [facility] CATH LAB;  Service: Cardiovascular;  Laterality: N/A;   PERCUTANEOUS CORONARY STENT INTERVENTION (PCI-S)  04/05/2014   Procedure: PERCUTANEOUS CORONARY STENT INTERVENTION (PCI-S);  Surgeon:  Jettie Booze, MD;  Location[facility] CATH LAB;  Service: Cardiovascular;;  OM1   RENAL ANGIOGRAM N/A 04/16/2011   Procedure: RENAL ANGIOGRAM;  Surgeon: Burnell Blanks, MD;  Location: [facility] CATH LAB;  Service: Cardiovascular;  Laterality: N/A;   RENAL ARTERY STENT  03/2011 ?    Family History  Problem Relation Age of Onset   Coronary artery disease Mother        Died MI age 60   Hypertension Mother    Heart attack Mother    Coronary artery disease Sister        Living   Coronary artery disease Brother        Living   Coronary artery disease Father        Died MI age 18   Heart attack Father  Heart attack Maternal Grandfather    Diabetes Neg Hx    Cancer Neg Hx        prostate or colon    Social History   Socioeconomic History   Marital status: Married    Spouse name: Not on file   Number of children: 3   Years of education: Not on file   Highest education level: Not on file  Occupational History   Occupation: Geneticist, molecular (Animal nutritionist)    Comment: Retired  Tobacco Use   Smoking status: Former    Packs/day: 3.00    Years: 25.00    Total pack years: 75.00    Types: Cigarettes    Quit date: 05/28/1993    Years since quitting: 28.6    Passive exposure: Past   Smokeless tobacco: Never  Vaping Use   Vaping Use: Never used  Substance and Sexual Activity   Alcohol use: Yes    Alcohol/week: 42.0 standard drinks of alcohol    Types: 42 Cans of beer per week   Drug use: No   Sexual activity: Not on file  Other Topics Concern   Not on file  Social History Narrative   No living will   No health care POA but requests wife--then children   Would accept resuscitation   Not sure about tube feeds   Social Determinants of Health   Financial Resource Strain: Not on file  Food Insecurity: Not on file  Transportation Needs: Not on file  Physical Activity: Not on file  Stress: Not on file  Social Connections: Not on file  Intimate Partner Violence:  Not on file   Review of Systems Sleeping okay---at least 4-5 hours Appetite has increased for several weeks Weight is going up some---off the [employer] (but still down from before that)     Objective:   Physical Exam Constitutional:      Appearance: Normal appearance.  Cardiovascular:     Rate and Rhythm: Normal rate and regular rhythm.     Heart sounds: No murmur heard.    No gallop.     Comments: Rate up to 60 or low 60's Pulmonary:     Effort: Pulmonary effort is normal.     Breath sounds: Normal breath sounds. No wheezing or rales.  Musculoskeletal:     Cervical back: Neck supple.     Right lower leg: No edema.     Left lower leg: No edema.  Lymphadenopathy:     Cervical: No cervical adenopathy.  Neurological:     Mental Status: He is alert.  Psychiatric:        Mood and Affect: Mood normal.        Behavior: Behavior normal.            Assessment & Plan:

## 2022-01-28 NOTE — Patient Instructions (Signed)
Starting tomorrow--take full hydrocortisone tablet ('10mg'$ ) upon awakening, and 5 mg (1/2 tab) around supper time. Stop the decadron

## 2022-01-28 NOTE — Assessment & Plan Note (Signed)
Still gets fairly stable angina--even recently Despite the hypotension, etc---will continue the carvedilol 3.125 bid (unless BP really low) and losartan 12.'5mg'$  daily On ranolizine 500 bid as well

## 2022-01-28 NOTE — Assessment & Plan Note (Addendum)
His cortisol level of 2.4 with a systolic BP is fairly diagnostic. Might need ACTH stimulation test to formally confirm--but his history sounds like early adrenal failure Will stop the dexadron Start hydrocortisone 10 AM, 5 PM Refer to endocrine--will take a while Discussed need for steroid bolus when ill

## 2022-01-28 NOTE — Assessment & Plan Note (Signed)
Euvolemic Echo shows EF 30-35% Will need to monitor fluid status closely on hydrocortisone Rx (but will try to keep it physiologic)

## 2022-01-29 ENCOUNTER — Telehealth: Payer: Self-pay

## 2022-01-29 ENCOUNTER — Ambulatory Visit: Payer: Medicare Other | Attending: Medical | Admitting: Medical

## 2022-01-29 ENCOUNTER — Encounter: Payer: Self-pay | Admitting: Medical

## 2022-01-29 ENCOUNTER — Other Ambulatory Visit: Payer: Self-pay | Admitting: Medical

## 2022-01-29 VITALS — BP 120/70 | HR 55 | Ht 63.0 in | Wt 168.8 lb

## 2022-01-29 DIAGNOSIS — F1010 Alcohol abuse, uncomplicated: Secondary | ICD-10-CM | POA: Diagnosis present

## 2022-01-29 DIAGNOSIS — I739 Peripheral vascular disease, unspecified: Secondary | ICD-10-CM | POA: Diagnosis present

## 2022-01-29 DIAGNOSIS — I25119 Atherosclerotic heart disease of native coronary artery with unspecified angina pectoris: Secondary | ICD-10-CM

## 2022-01-29 DIAGNOSIS — I5032 Chronic diastolic (congestive) heart failure: Secondary | ICD-10-CM

## 2022-01-29 DIAGNOSIS — I701 Atherosclerosis of renal artery: Secondary | ICD-10-CM

## 2022-01-29 DIAGNOSIS — I714 Abdominal aortic aneurysm, without rupture, unspecified: Secondary | ICD-10-CM | POA: Diagnosis present

## 2022-01-29 DIAGNOSIS — I5020 Unspecified systolic (congestive) heart failure: Secondary | ICD-10-CM | POA: Diagnosis not present

## 2022-01-29 DIAGNOSIS — I700 Atherosclerosis of aorta: Secondary | ICD-10-CM

## 2022-01-29 DIAGNOSIS — I255 Ischemic cardiomyopathy: Secondary | ICD-10-CM

## 2022-01-29 MED ORDER — DAPAGLIFLOZIN PROPANEDIOL 10 MG PO TABS: 10.0000 mg | ORAL_TABLET | Freq: Every day | ORAL | 11 refills | Status: DC

## 2022-01-29 NOTE — Patient Instructions (Addendum)
Medication Instructions:  Your physician has recommended you make the following change in your medication:   START Farxiga 10 mg once daily.  Samples Given: Farxgia 10 mg Lot: [id number] Exp: 02/24/24 # 1 box  *If you need a refill on your cardiac medications before your next appointment, please call your pharmacy*   Lab Work: BMET in 2 weeks. No appointment is needed.  Medical Mall Entrance at [facility] 1st desk on the right to check in (REGISTRATION)  Lab hours: Monday- Friday (7:30 am- 5:30 pm)  If you have labs (blood work) drawn today and your tests are completely normal, you will receive your results only by: [place] (if you have MyChart) OR A paper copy in the mail If you have any lab test that is abnormal or we need to change your treatment, we will call you to review the results.   Testing/Procedures: [place]  Your caregiver has ordered a Stress Test with nuclear imaging. The purpose of this test is to evaluate the blood supply to your heart muscle. This procedure is referred to as a "Non-Invasive Stress Test." This is because other than having an IV started in your vein, nothing is inserted or "invades" your body. Cardiac stress tests are done to find areas of poor blood flow to the heart by determining the extent of coronary artery disease (CAD). Some patients exercise on a treadmill, which naturally increases the blood flow to your heart, while others who are  unable to walk on a treadmill due to physical limitations have a pharmacologic/chemical stress agent called Lexiscan . This medicine will mimic walking on a treadmill by temporarily increasing your coronary blood flow.   Please note: these test may take anywhere between 2-4 hours to complete  PLEASE REPORT TO [place] AT THE FIRST DESK WILL DIRECT YOU WHERE TO GO  Date of Procedure:__________________  Arrival Time for Procedure:________________________  Instructions  regarding medication:   _XX___ : You may take all of your medications with small sip of water.   PLEASE NOTIFY THE OFFICE AT LEAST 62 HOURS IN ADVANCE IF YOU ARE UNABLE TO KEEP YOUR APPOINTMENT.  [phone] AND  PLEASE NOTIFY NUCLEAR MEDICINE AT [facility] AT LEAST 24 HOURS IN ADVANCE IF YOU ARE UNABLE TO KEEP YOUR APPOINTMENT. [phone]  How to prepare for your Myoview test:  Do not eat or drink after midnight No caffeine for 24 hours prior to test No smoking 24 hours prior to test. Your medication may be taken with water.  If your doctor stopped a medication because of this test, do not take that medication. Ladies, please do not wear dresses.  Skirts or pants are appropriate. Please wear a short sleeve shirt. No perfume, cologne or lotion. Wear comfortable walking shoes. No heels!    Follow-Up: At [facility], you and your health needs are our priority.  As part of our continuing mission to provide you with exceptional heart care, we have created designated Provider Care Teams.  These Care Teams include your primary Cardiologist (physician) and Advanced Practice Providers (APPs -  Physician Assistants and Nurse Practitioners) who all work together to provide you with the care you need, when you need it.   Your next appointment:   1 month(s)  The format for your next appointment:   In Person  Provider:   Ida Rogue, MD or Cadence Kathlen Mody, [place]        Important Information About Sugar

## 2022-01-29 NOTE — Progress Notes (Signed)
Cardiology Office Note:    Date:  01/31/2022   ID:  [name], DOB Aug 02, 1950, MRN [id number]  PCP:  Venia Carbon, MD  [facility]: [facility] Cardiologist:  Ida Rogue, MD  [place] Electrophysiologist:  None   Referring MD: Venia Carbon, MD   Chief Complaint: Hospital follow-up  History of Present Illness:    [name] is a 71 y.o. male with a hx of  CAD status post three-vessel CABG in 1995 with multiple subsequent PCI's beginning with an inferior STEMI in 2004 with unclear intervention, HFrEF secondary to ICM, diffuse PAD status post left renal artery stenting, enlarging infrarenal AAA measuring 4.6 cm by ultrasound in 02/2021, DM, HTN, HLD, remote tobacco use, and alcohol use who presents for hospital follow-up.  LHC from 2019 showed CTO's of the ostial RCA, mid LAD, SVG to OM to PDA and SVG to OM, with a widely patent LIMA to LAD that retrograde filled the CTO with antegrade flow brisk to the apex and 40% proximal left main stenosis which was a stable lesion just prior to a previously placed stent followed by widely patent stents running from the proximal left main into the LCx-OM with brisk distal flow and collaterals to RPL system.  Echo at that time showed an EF of 75%, grade 1 diastolic dysfunction, mild LVH, akinesis of the basal inferior and basal inferolateral myocardium, severe hypokinesis in the mid inferior and mid inferolateral myocardium, and mildly reduced RV systolic function.   He was admitted to the hospital from 1/18 through 1/20 with an NSTEMI.  High-sensitivity troponin peaked at 1256.  LHC on 05/13/2021 showed diffuse underlying native vessel CAD with high-grade proximal LAD lesion which perfused a large septal branch, the LIMA to LAD did not backfill the septal branch.  There was failed PCI due to inability to cross and likely represented CTO.  There was moderate in-stent restenosis of the proximal LCx with an RFR of 0.92, PCI was deferred.   Sequential vein graft to OM and PDA along with vein graft to OM, native RCA were not imaged as they were known to be occluded.  LVEDP 8 mmHg.  Of note, there was an absent left radial pulse with recommendation for future cardiac cath to be pursued via right radial or femoral artery approaches.  Medical therapy was recommended.  Echo during that admission showed an EF of 45 to 50%, akinesis of the basal inferior, inferolateral, and inferoseptal walls, grade 1 diastolic dysfunction, normal RV systolic function and ventricular cavity size, no significant valvular abnormalities, and an estimated right atrial pressure of 3 mmHg.  He was initiated on ranolazine 500 mg twice daily.  The patient was admitted for dizziness with associated low BP, suspected it was from adrenal insufficieny. He was placed on decadrone with improvement of symptoms. Echo showed a drop in EF 45-50%> 30-35%. Cardiology consult was offered, but he wanted to go home.   Today, the patient reports he was diagnosed with Addison's, so he was placed on Cortef. BP is good today. Echo was reviewed. He reports worsening shortness of breath. He denies chest pain. He has chronic lower leg edema. For his shortness of breath, we discussed heat cath vs lexiscan myoview.   Past Medical History:  Diagnosis Date   AAA (abdominal aortic aneurysm) ([place])    a. Duplex 01/2012: stable infrarenal saccular AAA at 3.3cm x 3.2xm (f/u recommended 01/2013 per Dr. Rockey Situ)   Alcohol abuse    CAD (coronary artery disease)  a. 1995 s/p CABG x 4: LIMA->LAD, VG->RI (known to be occluded), VG->AM->PDA;  b. 05/2002 Inf STEMI: VG->AM->PDA 100% treated w/ 2 BMS complicated by acute thrombosis req 3 BMS;  c. 07/2003 DES to  LAD & LCX (VG's to PDA & RI 100%);  d. 12/2010 Acute MI ([place]): DES to LCX & LM , LIMA ok,;  e.12/2011 Cath: LM/LCX stents ok , LIMA patent.   Cardiomyopathy ([place])    a. EF 35% by cath 2015.   Diverticulitis    1/06 Diverticulitis--CT of pelvis--diffuse  sigmoid divertic   Dyspnea    GERD (gastroesophageal reflux disease)    Hyperlipidemia    Hypertension    Myocardial infarction [facility])    PAD (peripheral artery disease) ([place])    a. external iliac and mesenteric stenosis noted by noninvasive imaging.   Renal artery stenosis ([place])    a. 03/2011 PTA and stenting of L RA. b. last duplex 2016 with stable 1-59% bilateral RAS, incidental >50% R EIA stenosis    Past Surgical History:  Procedure Laterality Date   [place]     8/09  Cath--vein graft occlusions which are old--no acute changes   CARDIAC CATHETERIZATION  11/13/2010   stent x 2 @ [place]   CARDIAC CATHETERIZATION  04/05/2014   stent placement    CARDIAC CATHETERIZATION  10/28/2017   CLOSED REDUCTION SHOULDER DISLOCATION     COLONOSCOPY WITH PROPOFOL N/A 12/22/2018   Procedure: COLONOSCOPY WITH PROPOFOL;  Surgeon: Jonathon Bellows, MD;  Location: [facility] ENDOSCOPY;  Service: Gastroenterology;  Laterality: N/A;   CORONARY ANGIOPLASTY  04/05/2014   stent placement OM 1   CORONARY ARTERY BYPASS GRAFT     CORONARY STENT PLACEMENT  7/12   2 stents--Promus element plus (everolimus eluting)--[facility] [employer] in [place] WIRE/FFR STUDY N/A 05/13/2021   Procedure: INTRAVASCULAR PRESSURE WIRE/FFR STUDY;  Surgeon: Early Osmond, MD;  Location: [place] CV LAB;  Service: Cardiovascular;  Laterality: N/A;   LEFT HEART CATH AND CORONARY ANGIOGRAPHY N/A 05/13/2021   Procedure: LEFT HEART CATH AND CORONARY ANGIOGRAPHY;  Surgeon: Early Osmond, MD;  Location: [place] CV LAB;  Service: Cardiovascular;  Laterality: N/A;   LEFT HEART CATH AND CORS/GRAFTS ANGIOGRAPHY N/A 10/28/2017   Procedure: LEFT HEART CATH AND CORS/GRAFTS ANGIOGRAPHY;  Surgeon: Leonie Man, MD;  Location: [place] CV LAB;  Service: Cardiovascular;  Laterality: N/A;   LEFT HEART CATHETERIZATION WITH CORONARY/GRAFT ANGIOGRAM N/A 01/04/2012   Procedure: LEFT  HEART CATHETERIZATION WITH Beatrix Fetters;  Surgeon: Burnell Blanks, MD;  Location[facility] C[facility] LAB;  Service: Cardiovascular;  Laterality: N/A;   LEFT HEART CATHETERIZATION WITH CORONARY/GRAFT ANGIOGRAM N/A 04/05/2014   Procedure: LEFT HEART CATHETERIZATION WITH Beatrix Fetters;  Surgeon: Jettie Booze, MD;  Location: [facility] CATH LAB;  Service: Cardiovascular;  Laterality: N/A;   PERCUTANEOUS CORONARY STENT INTERVENTION (PCI-S)  04/05/2014   Procedure: PERCUTANEOUS CORONARY STENT INTERVENTION (PCI-S);  Surgeon: Jettie Booze, MD;  Location: [facility] CATH LAB;  Service: Cardiovascular;;  OM1   RENAL ANGIOGRAM N/A 04/16/2011   Procedure: RENAL ANGIOGRAM;  Surgeon: Burnell Blanks, MD;  Location: [facility] CATH LAB;  Service: Cardiovascular;  Laterality: N/A;   RENAL ARTERY STENT  03/2011 ?    Current Medications: Current Meds  Medication Sig   aspirin 81 MG tablet Take 81 mg by mouth daily.   carvedilol (COREG) 3.125 MG tablet TAKE 1 TABLET (3.125 MG TOTAL) BY MOUTH 2 (TWO) TIMES DAILY. (Patient  taking differently: Take 3.125 mg by mouth daily at 12 noon.)   clopidogrel (PLAVIX) 75 MG tablet TAKE 1 TABLET BY MOUTH EVERY DAY (Patient taking differently: Take 75 mg by mouth daily.)   cyanocobalamin (VITAMIN B12) 1000 MCG tablet Take 1,000 mcg by mouth daily.   dapagliflozin propanediol (FARXIGA) 10 MG TABS tablet Take 1 tablet (10 mg total) by mouth daily before breakfast.   ezetimibe (ZETIA) 10 MG tablet TAKE 1 TABLET (10 MG TOTAL) BY MOUTH DAILY.   gabapentin (NEURONTIN) 100 MG capsule Take 1 capsule (100 mg total) by mouth 2 (two) times daily.   hydrocortisone (CORTEF) 10 MG tablet Take 1 tablet (10 mg total) by mouth daily. And 1/2 tab around suppertime   losartan (COZAAR) 25 MG tablet Take 0.5 tablets (12.5 mg total) by mouth daily.   mirtazapine (REMERON) 15 MG tablet TAKE 1 TABLET BY MOUTH EVERYDAY AT BEDTIME (Patient taking differently: Take 15 mg by mouth at  bedtime.)   nitroGLYCERIN (NITROSTAT) 0.4 MG SL tablet PLACE 1 TABLET UNDER THE TONGUE EVERY 5 MINUTES AS NEEDED FOR CHEST PAIN. (Patient taking differently: Place 0.4 mg under the tongue every 5 (five) minutes as needed for chest pain.)   pantoprazole (PROTONIX) 40 MG tablet Take 1 tablet (40 mg total) by mouth 2 (two) times daily.   ranolazine (RANEXA) 500 MG 12 hr tablet Take 1 tablet (500 mg total) by mouth 2 (two) times daily.   rosuvastatin (CRESTOR) 40 MG tablet TAKE 1 TABLET BY MOUTH EVERY DAY (Patient taking differently: Take 40 mg by mouth daily.)     Allergies:   Metoprolol tartrate, Quinolones, and Metoclopramide   Social History   Socioeconomic History   Marital status: Married    Spouse name: Not on file   Number of children: 3   Years of education: Not on file   Highest education level: Not on file  Occupational History   Occupation: Geneticist, molecular (Animal nutritionist)    Comment: Retired  Tobacco Use   Smoking status: Former    Packs/day: 3.00    Years: 25.00    Total pack years: 75.00    Types: Cigarettes    Quit date: 05/28/1993    Years since quitting: 28.6    Passive exposure: Past   Smokeless tobacco: Never  Vaping Use   Vaping Use: Never used  Substance and Sexual Activity   Alcohol use: Yes    Alcohol/week: 42.0 standard drinks of alcohol    Types: 42 Cans of beer per week   Drug use: No   Sexual activity: Not on file  Other Topics Concern   Not on file  Social History Narrative   No living will   No health care POA but requests wife--then children   Would accept resuscitation   Not sure about tube feeds   Social Determinants of Health   Financial Resource Strain: Not on file  Food Insecurity: Not on file  Transportation Needs: Not on file  Physical Activity: Not on file  Stress: Not on file  Social Connections: Not on file     Family History: The patient's family history includes Coronary artery disease in his brother, father,  mother, and sister; Heart attack in his father, maternal grandfather, and mother; Hypertension in his mother. There is no history of Diabetes or Cancer.  ROS:   Please see the history of present illness.     All other systems reviewed and are negative.  EKGs/Labs/Other Studies Reviewed:    The  following studies were reviewed today:  Echo 01/25/22  1. Left ventricular ejection fraction, by estimation, is 30 to 35%. The  left ventricle has moderately decreased function. The left ventricle  demonstrates regional wall motion abnormalities (see scoring  diagram/findings for description). The left  ventricular internal cavity size was mildly dilated. Left ventricular  diastolic parameters are consistent with Grade I diastolic dysfunction  (impaired relaxation).   2. Right ventricular systolic function is moderately reduced. The right  ventricular size is normal. Tricuspid regurgitation signal is inadequate  for assessing PA pressure.   3. The mitral valve is normal in structure. Trivial mitral valve  regurgitation.   4. The aortic valve is tricuspid. Aortic valve regurgitation is not  visualized. Aortic valve sclerosis/calcification is present, without any  evidence of aortic stenosis.   5. The inferior vena cava is normal in size with greater than 50%  respiratory variability, suggesting right atrial pressure of 3 mmHg.   Echo 04/2021  1. Akinesis of the basal inferior, inferolateral and inferoseptal walls  with overall mild LV dysfunction.   2. Left ventricular ejection fraction, by estimation, is 45 to 50%. The  left ventricle has mildly decreased function. The left ventricle  demonstrates regional wall motion abnormalities (see scoring  diagram/findings for description). Left ventricular  diastolic parameters are consistent with Grade I diastolic dysfunction  (impaired relaxation).   3. Right ventricular systolic function is normal. The right ventricular  size is normal.   4. The  mitral valve is normal in structure. No evidence of mitral valve  regurgitation. No evidence of mitral stenosis.   5. The aortic valve has an indeterminant number of cusps. Aortic valve  regurgitation is not visualized. No aortic stenosis is present.   6. The inferior vena cava is normal in size with greater than 50%  respiratory variability, suggesting right atrial pressure of 3 mmHg.   Cardiac cath 04/2021     Ost LM lesion is 45% stenosed.   Prox LAD-2 lesion is 100% stenosed.   Ost RCA to Dist RCA lesion is 100% stenosed.   Origin to Insertion lesion before Acute Mrg  is 100% stenosed.   Origin lesion is 100% stenosed.   Prox LAD-1 lesion is 99% stenosed.   Non-stenotic Mid LM to Dist LM lesion was previously treated.   Non-stenotic Ost Cx to Mid Cx lesion was previously treated.   Seq SVG- AVM-PDA due to known occlusion.   LIMA and is normal in caliber.   SVG due to known occlusion.   The graft exhibits no disease.   LV end diastolic pressure is normal.   1.  High-grade proximal LAD lesion which perfuses a large septal; the LIMA to the LAD does not backfill just septal.  Failed PCI due to inability to cross and likely represents a chronic total occlusion. 2.  Moderate in-stent restenosis of proximal left circumflex with RFR of 0.92; PCI was deferred. 3.  Sequential vein graft to OM to PDA, vein graft to OM,, and native right coronary artery were not imaged as they are known occluded. 4.  LVEDP of 8 mmHg 5.  Absent left radial pulse; future cardiac catheterizations should pursue a right radial or femoral approaches depending on clinical context.   Commendation: Intensive medical therapy.  Coronary Diagrams  Diagnostic Dominance: Right     EKG:  EKG is not ordered today.    Recent Labs: 01/24/2022: ALT 12 01/25/2022: TSH 1.342 01/26/2022: BUN 7; Creatinine, Ser 0.94; Hemoglobin 9.9; Magnesium 2.1; Platelets  266; Potassium 4.4; Sodium 133  Recent Lipid Panel    Component  Value Date/Time   CHOL 136 12/15/2021 0819   CHOL 163 08/11/2015 0941   TRIG 172.0 (H) 12/15/2021 0819   HDL 52.20 12/15/2021 0819   HDL 46 08/11/2015 0941   CHOLHDL 3 12/15/2021 0819   VLDL 34.4 12/15/2021 0819   LDLCALC 50 12/15/2021 0819   LDLCALC 63 01/07/2017 1120   LDLDIRECT 65.0 02/20/2021 1027     Physical Exam:    VS:  BP 120/70   Pulse (!) 55   Ht '5\' 3"'$  (1.6 m)   Wt 168 lb 12.8 oz (76.6 kg)   SpO2 97%   BMI 29.90 kg/m     Wt Readings from Last 3 Encounters:  01/29/22 168 lb 12.8 oz (76.6 kg)  01/28/22 169 lb (76.7 kg)  01/25/22 167 lb 12.3 oz (76.1 kg)     GEN:  Well nourished, well developed in no acute distress HEENT: Normal NECK: No JVD; No carotid bruits LYMPHATICS: No lymphadenopathy CARDIAC: RRR, no murmurs, rubs, gallops RESPIRATORY:  Clear to auscultation without rales, wheezing or rhonchi  ABDOMEN: Soft, non-tender, non-distended MUSCULOSKELETAL:  No edema; No deformity  SKIN: Warm and dry NEUROLOGIC:  Alert and oriented x 3 PSYCHIATRIC:  Normal affect   ASSESSMENT:    1. HFrEF (heart failure with reduced ejection fraction) ([place])   2. Coronary artery disease involving native coronary artery of native heart with angina pectoris ([place])   3. Aortic atherosclerosis ([place])   4. Ischemic cardiomyopathy   5. PAD (peripheral artery disease) ([place])   6. Renal artery stenosis ([facility])   7. Abdominal aortic aneurysm (AAA) without rupture, unspecified part ([place])   8. ETOH abuse    PLAN:    In order of problems listed above:  Reduced EF ICM  Recent hospitalization showed reduced EF 30-35%, previous LVEF 45-50%. Patient denies chest pain, he reports progressive SOB. He had a cath in January 2023 showing no PCI Targets. We ware weary of repeating cath since he has had multiple. We will start with a Myoview Lexsican. May need repeat heart cath in the future. I will start Farxiga '10mg'$  daily. BMET in 1-2 weeks. Continue Coreg and Losartan.   CAD Patient denies  chest pain, but reports progressive shortness of breath. Plan for [facility] as above. Continue ASA, Coreg, Zetia, and Crestor.   PAD AAA measuring 4.5, this is closely monitored by [facility].   Renal artery stenosis S/p prior left sided stent.   ETOH use Pt is drinking 3 beer daily. Cessation recommended.   Disposition: Follow up in 1 month(s) with MD/APP   Shared Decision Making/Informed Consent   Shared Decision Making/Informed Consent The risks [chest pain, shortness of breath, cardiac arrhythmias, dizziness, blood pressure fluctuations, myocardial infarction, stroke/transient ischemic attack, nausea, vomiting, allergic reaction, radiation exposure, metallic taste sensation and life-threatening complications (estimated to be 1 in 10,000)], benefits (risk stratification, diagnosing coronary artery disease, treatment guidance) and alternatives of a nuclear stress test were discussed in detail with Mr. Deutscher and he agrees to proceed.    Signed, Mandrell Vangilder Ninfa Meeker, PA-C  01/31/2022 9:05 PM    [facility]

## 2022-01-29 NOTE — Telephone Encounter (Signed)
Received a fax from [facility] stating they cannot take him as a pt at this time.

## 2022-02-02 ENCOUNTER — Telehealth: Payer: Self-pay | Admitting: Cardiovascular Disease

## 2022-02-02 ENCOUNTER — Telehealth: Payer: Self-pay | Admitting: Internal Medicine

## 2022-02-02 NOTE — Telephone Encounter (Signed)
Pt c/o medication issue:  1. Name of Medication:  dapagliflozin propanediol (FARXIGA) 10 MG TABS tablet  2. How are you currently taking this medication (dosage and times per day)?  Take 1 tablet (10 mg total) by mouth daily before breakfast.  3. Are you having a reaction (difficulty breathing--STAT)? no  4. What is your medication issue? Patient cant afford medication. Calling to see what his options are. Please advise

## 2022-02-02 NOTE — Telephone Encounter (Signed)
I spoke with pt; pt said he is not going to UC or ED. He was seen in ED first of Oct and admitted to hospital and he is not going back. Pt had HFU with Dr Silvio Pate on 01/28/22. Pt said no CP,SOB or H/A; pt has lightheadedness on and off when stands up. Pt took BP now and BP 106/58 P 60. Pt said he is not going to take carvedilol tonight. Pt said if BP drops again he will go to ED but recked BP 111/62 P 48 and pt said his heart beats slow. Pt scheduled appt with Romilda Garret NP on 02/03/22 at 10:40. Sending note to DR Silvio Pate and Romilda Garret NP who are out of office and will send to Dr Diona Browner who is in office and Anastasiya CMA. Reviewed UC & ED precautions again before ending call and pt voiced understanding.

## 2022-02-02 NOTE — Telephone Encounter (Signed)
FYI- I think I also provided him with an application for assistance the other day as well, but not 100% sure.

## 2022-02-02 NOTE — Telephone Encounter (Signed)
Duplicate encounter

## 2022-02-02 NOTE — Telephone Encounter (Signed)
Spoke with patient and he reports that the [place] is too expensive for them. It was going to be $360.00 per month. Patient states he was given samples and free 30 day card but didn't know if he should take it if he is not able to continue due to cost of the medication. So he wants to know if there is alternative to [place] that would be cheaper. Advised I would send over to provider for her recommendations and will be in touch. He verbalized understanding with no further questions.

## 2022-02-02 NOTE — Telephone Encounter (Signed)
Pt c/o medication issue:  1. Name of Medication:  dapagliflozin propanediol (FARXIGA) 10 MG TABS tablet   2. How are you currently taking this medication (dosage and times per day)?   3. Are you having a reaction (difficulty breathing--STAT)?   4. What is your medication issue?   Patient's wife states the patient is unable to afford the medication and would like to discuss alternatives.

## 2022-02-02 NOTE — Telephone Encounter (Signed)
Received call from access nurse recommended that patient call 911 but refused only wanted to talk to someone at our office.

## 2022-02-02 NOTE — Telephone Encounter (Signed)
[facility] - Client TELEPHONE ADVICE RECORD [facility]urse Patient Name: [name] Gender: Male DOB: 07-Jun-1950 Age: 71 Y 49 M 17 D Return Phone Number: [phone] (Primary), [phone] (Secondary) Address: City/ State/ Zip: [place] [address] Client [name] - Client Client Site [place] - Day Provider Viviana Simpler- MD Contact Type Call Who Is Calling Patient / Member / Family / Caregiver Call Type Triage / Clinical Caller Name Collie Siad Relationship To Patient Spouse Return Phone Number [phone] (Primary) Chief Complaint BLOOD PRESSURE LOW - Systolic (top number) 90 or less Reason for Call Symptomatic / Request for [name] states that his blood pressure is running low at 85/55 and he is feeling dizzy. Translation No Nurse Assessment Nurse: Velta Addison, RN, Helene Kelp Date/Time Eilene Ghazi Time): 02/02/2022 4:15:35 PM Confirm and document reason for call. If symptomatic, describe symptoms. ---Caller states that spouse has a BP of 85/55. Reports that he is feeling light-headed. Reports that he was in the hospital last week with low BP. Has been low since this morning 70/40. Was diagnosed with Addison's disease. Does the patient have any new or worsening symptoms? ---Yes Will a triage be completed? ---Yes Related visit to physician within the last 2 weeks? ---Yes Does the PT have any chronic conditions? (i.e. diabetes, asthma, this includes High risk factors for pregnancy, etc.) ---Yes List chronic conditions. ---[place], [place] 2/23, Is this a behavioral health or substance abuse call? ---No Guidelines Guideline Title Affirmed Question Affirmed Notes Nurse Date/Time (Eastern Time) Blood Pressure - Low [5] Systolic BP < 90 AND [3] dizzy, lightheaded, or weak Velta Addison, RN, Helene Kelp 02/02/2022 4:18:21 PM Disp. Time Eilene Ghazi Time) Disposition Final  User 02/02/2022 4:14:13 PM Send to Urgent Zettie Pho, Gabriel Cirri PLEASE NOTE: All timestamps contained within this report are represented as [place] Standard Time. CONFIDENTIALTY NOTICE: This fax transmission is intended only for the addressee. It contains information that is legally privileged, confidential or otherwise protected from use or disclosure. If you are not the intended recipient, you are strictly prohibited from reviewing, disclosing, copying using or disseminating any of this information or taking any action in reliance on or regarding this information. If you have received this fax in error, please notify us immediately by telephone so that we can arrange for its return to [place]. Phone: [phone], Toll-Free: [phone], Fax: [phone] Page: 2 of 2 Call Id: [id number] [place]. Time Eilene Ghazi Time) Disposition Final User 02/02/2022 4:22:55 PM Call EMS 911 Now Yes Velta Addison, RNHelene Kelp 02/02/2022 4:25:11 PM 911 Outcome Documentation Velta Addison, RN, Helene Kelp Reason: Caller refused to call 911. Final Disposition 02/02/2022 4:22:55 PM Call EMS 911 Now Yes Velta Addison, RN, [place] Artist Disagree/Comply Disagree Caller Understands Yes PreDisposition Call Doctor Care Advice Given Per Guideline CALL EMS 911 NOW: CARE ADVICE given per Low Blood Pressure (Adult) guideline. * Immediate medical attention is needed. You need to hang up and call 911 (or an ambulance). * Triager Discretion: I'll call you back in a few minutes to be sure you were able to reach them. Comments User: Carles Collet, RN Date/Time Eilene Ghazi Time): 02/02/2022 4:20:38 PM Reports that he rechecked bp and its 106/58 User: Carles Collet, RN Date/Time Eilene Ghazi Time): 02/02/2022 4:22:28 PM BP is 106/60 sitting. User: Carles Collet, RN Date/Time Eilene Ghazi Time): 02/02/2022 4:25:37 PM Nurse contacted backline per directives

## 2022-02-02 NOTE — Telephone Encounter (Signed)
Patient called in stating that his blood pressure has been reading low (85/55 and 84/57) and dizzy spells. Sent over to access nurse.

## 2022-02-02 NOTE — Telephone Encounter (Signed)
Left voicemail message to call back  

## 2022-02-02 NOTE — Telephone Encounter (Signed)
Noted. Agree with dispo.

## 2022-02-03 ENCOUNTER — Ambulatory Visit (INDEPENDENT_AMBULATORY_CARE_PROVIDER_SITE_OTHER): Payer: Medicare Other | Admitting: Nurse Practitioner

## 2022-02-03 ENCOUNTER — Telehealth: Payer: Self-pay

## 2022-02-03 ENCOUNTER — Telehealth: Payer: Self-pay | Admitting: Nurse Practitioner

## 2022-02-03 VITALS — BP 112/56 | HR 72 | Temp 96.1°F | Resp 18 | Ht 63.0 in | Wt 167.1 lb

## 2022-02-03 DIAGNOSIS — I9589 Other hypotension: Secondary | ICD-10-CM | POA: Diagnosis not present

## 2022-02-03 DIAGNOSIS — R42 Dizziness and giddiness: Secondary | ICD-10-CM | POA: Diagnosis not present

## 2022-02-03 DIAGNOSIS — E2740 Unspecified adrenocortical insufficiency: Secondary | ICD-10-CM | POA: Diagnosis not present

## 2022-02-03 NOTE — Telephone Encounter (Signed)
Spoke to pt. He saw Gaetana Michaelis today and was advised to stop the losartan. He is asking does he stop the carvedilol, also?

## 2022-02-03 NOTE — Chronic Care Management (AMB) (Addendum)
Chronic Care Management Pharmacy Assistant   Name: [name]  MRN: [id number] DOB: 06-03-1950  Reason for Encounter: Hospital Follow Up  non ccm    Medications: Outpatient Encounter Medications as of 02/03/2022  Medication Sig   aspirin 81 MG tablet Take 81 mg by mouth daily.   carvedilol (COREG) 3.125 MG tablet TAKE 1 TABLET (3.125 MG TOTAL) BY MOUTH 2 (TWO) TIMES DAILY. (Patient taking differently: Take 3.125 mg by mouth daily at 12 noon.)   clopidogrel (PLAVIX) 75 MG tablet TAKE 1 TABLET BY MOUTH EVERY DAY (Patient taking differently: Take 75 mg by mouth daily.)   cyanocobalamin (VITAMIN B12) 1000 MCG tablet Take 1,000 mcg by mouth daily.   dapagliflozin propanediol (FARXIGA) 10 MG TABS tablet Take 1 tablet (10 mg total) by mouth daily before breakfast.   ezetimibe (ZETIA) 10 MG tablet TAKE 1 TABLET (10 MG TOTAL) BY MOUTH DAILY.   gabapentin (NEURONTIN) 100 MG capsule Take 1 capsule (100 mg total) by mouth 2 (two) times daily.   hydrocortisone (CORTEF) 10 MG tablet Take 1 tablet (10 mg total) by mouth daily. And 1/2 tab around suppertime   losartan (COZAAR) 25 MG tablet Take 0.5 tablets (12.5 mg total) by mouth daily.   mirtazapine (REMERON) 15 MG tablet TAKE 1 TABLET BY MOUTH EVERYDAY AT BEDTIME (Patient taking differently: Take 15 mg by mouth at bedtime.)   nitroGLYCERIN (NITROSTAT) 0.4 MG SL tablet PLACE 1 TABLET UNDER THE TONGUE EVERY 5 MINUTES AS NEEDED FOR CHEST PAIN. (Patient taking differently: Place 0.4 mg under the tongue every 5 (five) minutes as needed for chest pain.)   pantoprazole (PROTONIX) 40 MG tablet Take 1 tablet (40 mg total) by mouth 2 (two) times daily.   ranolazine (RANEXA) 500 MG 12 hr tablet Take 1 tablet (500 mg total) by mouth 2 (two) times daily.   rosuvastatin (CRESTOR) 40 MG tablet TAKE 1 TABLET BY MOUTH EVERY DAY (Patient taking differently: Take 40 mg by mouth daily.)   No facility-administered encounter medications on file as of 02/03/2022.     Reviewed hospital notes for details of recent visit. Has patient been contacted by Transitions of Care team? Yes Has patient seen PCP/specialist for hospital follow up (summarize OV if yes): Yes- 01/28/22-sounds like early adrenal failure,Will stop the dexadron Start hydrocortisone 10 AM, 5 PM Refer to endocrine,Discussed need for steroid bolus when ill.  02/03/22-James Cable,NP(fam med)-Hypotension-hold his 12.5 mg of losartan. cardiology notified.  Admitted to the hospital on 01/24/22. Discharge date was 01/26/22 Discharged from [facility]   Discharge diagnosis (Principal Problem): postural dizziness with presyncope Patient was discharged to Home  Brief summary of hospital course: He has been experiencing drop in blood pressure in the evening hours to the tune of systolic being in 24M, his blood pressure is usually fine in the morning, this has been going on for several weeks.  His presentation was highly suspicious for adrenal insufficiency, random cortisol when he was hypotensive and early in the morning was under 2, he was placed on Decadron after which his symptoms have resolved and blood pressures have stabilized.  He is very eager to go home and he is currently symptom-free he will be placed on 6 mg of oral Decadron requested to follow-up with his PCP within a week of discharge.  Request PCP to arrange for outpatient endocrine and cosyntropin stimulation test.  I am giving him 30-day supply of Decadron.  New?Medications Started at [facility] Discharge:?? -Started dexamethasone  Medications Discontinued at Hospital Discharge: -Stopped tizanidine  Medications that remain the same after Hospital Discharge:??  -All other medications will remain the same.    Next CCM appt: none  Other upcoming appts: PCP appointment on 02/03/22  fam med  02/04/22 -[facility] call    02/24/22   PCP  Charlene Brooke, PharmD notified and will determine if action is needed.    Avel Sensor, [place]  [phone]    Pharmacist addendum: Patient has had appropriate follow up and has been referred to endocrine to manage adrenal insufficiency. No further action needed.  Charlene Brooke, PharmD, BCACP 02/08/22 9:27 AM

## 2022-02-03 NOTE — Assessment & Plan Note (Signed)
Orthostatics positive in office.  Patient seems to be drinking plenty of fluids.  Encourage patient to start drinking electrolyte drink.

## 2022-02-03 NOTE — Assessment & Plan Note (Signed)
Patient experiencing some hypotension.  We will hold his 12.5 mg of losartan.  We will send note to patient's primary cardiologist.  Patient is already taking losartan today.  If his blood pressure is low when he checks it at home he will hold his second dose of carvedilol.

## 2022-02-03 NOTE — Telephone Encounter (Signed)
Dr. Rockey Situ   I saw [name] in office today with a combination of orthostatic hypotension and hypotension.  Recently diagnosed with Addison's disease.  I did tell patient to hold losartan 12.5 mg currently due to the hypotension.  Since you are the order that prescription wanted to reach out to make you aware.  Told him if she had different recommendations that your staff would reach out to him.  Thanks, [employer]

## 2022-02-03 NOTE — Assessment & Plan Note (Signed)
Currently maintained on Cortef 10 mg every morning and Cortef 5 mg every afternoon.  He is having fluctuations in blood pressure.  He has been referred to endocrinology.

## 2022-02-03 NOTE — Patient Instructions (Signed)
Nice to see you today Try to incorporate an electrolyte drink like gatorade once a day Hold the losartan until you hear from [place]  If your blood pressure is low tonight do NOT take the Carvedilol. Follow up if no improvement

## 2022-02-03 NOTE — Telephone Encounter (Signed)
After speaking to [name], again. I verified with pt that he is to stop the losartan for now until [facility] hears from Dr Rockey Situ. Continue carvedilol.

## 2022-02-03 NOTE — Progress Notes (Signed)
Acute Office Visit  Subjective:     Patient ID: [name], male    DOB: March 12, 1951, 71 y.o.   MRN: [id number]  Chief Complaint  Patient presents with   low b/p    Went to ER on 01/24/22 for low b/p but at the hospital it was normal. Yesterday-02/02/22 b/p in the am was 80s/50s-stayed like that for most of the day. Reading this morning was 107/58    HPI Patient is in today for hypotension  States Bp was 01B systolic and called EMS and they could not get the BP reading. States when he got to hospital his BP was 120/70s.  He was admitted to the hospital thereafter on 01/24/2022 and discharged on 01/26/2022.  Patient has a working diagnosis of Addison disease.  Patient was put on Decadron and follow-up with primary care provider who discontinued Decadron and placed patient on Cortef 10 mg every morning and 5 mg every afternoon.  Patient was referred to endocrinology but has not seen them as of yet.  He has had a follow-up with his cardiology clinic  Patient called and was triaged by in house nurse on yesterday States that his blood pressure was 80s/60s yesterday morning. States that the blood pressure stayed around the same reading.  States that he was feeling lightheaded.  Patient denies any syncopal or presyncopal episodes.  States blood pressure this morning was 107/58 hr was 60  Review of Systems  Constitutional:  Negative for chills and fever.  Respiratory:  Negative for shortness of breath.   Cardiovascular:  Negative for chest pain.  Neurological:  Positive for dizziness (lightheaded). Negative for headaches.        Objective:    BP (!) 112/56   Pulse 72   Temp (!) 96.1 F (35.6 C) (Temporal)   Resp 18   Ht '5\' 3"'$  (1.6 m)   Wt 167 lb 2 oz (75.8 kg)   SpO2 100%   BMI 29.60 kg/m    Physical Exam Vitals and nursing note reviewed.  Constitutional:      Appearance: Normal appearance.  Neck:     Vascular: No carotid bruit.  Cardiovascular:     Rate and Rhythm:  Normal rate and regular rhythm.     Heart sounds: Normal heart sounds.  Pulmonary:     Effort: Pulmonary effort is normal.     Breath sounds: Normal breath sounds.  Musculoskeletal:     Right lower leg: No edema.     Left lower leg: Edema present.  Neurological:     Mental Status: He is alert.     No results found for any visits on 02/03/22.      Assessment & Plan:   Problem List Items Addressed This Visit       Cardiovascular and Mediastinum   Arterial hypotension - Primary    Patient experiencing some hypotension.  We will hold his 12.5 mg of losartan.  We will send note to patient's primary cardiologist.  Patient is already taking losartan today.  If his blood pressure is low when he checks it at home he will hold his second dose of carvedilol.        Endocrine   Adrenal insufficiency (HCC)    Currently maintained on Cortef 10 mg every morning and Cortef 5 mg every afternoon.  He is having fluctuations in blood pressure.  He has been referred to endocrinology.        Other   Lightheadedness    Orthostatics  positive in office.  Patient seems to be drinking plenty of fluids.  Encourage patient to start drinking electrolyte drink.       No orders of the defined types were placed in this encounter.   Return if symptoms worsen or fail to improve.  Romilda Garret, NP

## 2022-02-03 NOTE — Telephone Encounter (Signed)
No I wanted him to stop the losartan and sent a message to his cardiologist.

## 2022-02-04 ENCOUNTER — Ambulatory Visit: Payer: Self-pay

## 2022-02-04 NOTE — Patient Instructions (Signed)
Visit Information  Thank you for taking time to visit with me today. Please don't hesitate to contact me if I can be of assistance to you.   Following are the goals we discussed today:   Goals Addressed             This Visit's Progress    Patient Stated: Manage my Addison's condition and low blood pressure       Care Coordination Interventions: Evaluation of current treatment plan related to Addison's disease and patient's adherence to plan as established by provider Advised patient to notify provider for ongoing low blood pressures or increase in symptoms Reviewed medications with patient and discussed importance of compliance Provided patient with educational materials related to Addison's disease Reviewed scheduled/upcoming provider appointments Discussed plans with patient for ongoing care management follow up and provided patient with direct contact information for care management team Discussed Addison's condition and related signs/ symptoms Discussed healthy diet for management of Addison's Advised patient to stay hydrated and encouraged patient to drink electrolyte as recommended by provider Advised to monitor blood pressure daily and record. For symptoms of lightheadedness:  sit or lie down.  Can raise your legs when sitting or lying down.  Rise slow from sitting or lying position to standing.           Our next appointment is by telephone on 03/03/22 at 9:30 am  Please call the care guide team at [phone] if you need to cancel or reschedule your appointment.   If you are experiencing a Mental Health or [place] or need someone to talk to, please call 1-800-273-TALK (toll free, 24 hour hotline)  Patient verbalizes understanding of instructions and care plan provided today and agrees to view in [place]. Active MyChart status and patient understanding of how to access instructions and care plan via MyChart confirmed with patient.     Quinn Plowman  RN,BSN,CCM [facility] Care Coordination [phone] direct line   Addison's Disease  Addison's disease, which is also called primary adrenal insufficiency, is a condition in which the adrenal glands do not make enough of the hormones cortisol and aldosterone. The two adrenal glands are located above each kidney. The disease causes blood pressure to drop and causes potassium to build up to dangerous levels. If Addison's disease is not treated, it can suddenly get worse and become life-threatening. This is called an adrenal crisis(addisonian crisis). What are the causes? This condition may be caused by: A disease in which the body's immune system damages the adrenal glands. An infection of the adrenal glands. Bleeding (hemorrhage) in the adrenal glands. A tumor. What are the signs or symptoms? Common symptoms of this condition include: Severe fatigue. Muscle weakness. Loss of appetite. Weight loss. Darkening of the skin. Low blood pressure. Other symptoms include: Nausea or vomiting. Diarrhea. Dizziness or fainting. Irritability. Depression. Salt cravings. Low blood sugar. Irregular or no menstrual periods. Symptoms usually develop slowly and get worse gradually. How is this diagnosed? This condition may be diagnosed based on your: Medical history. Symptoms. Lab test results. Lab tests include a measurement of your blood cortisol levels (ACTH stimulation test). Imaging test results. You may have a CT scan of the adrenal glands. How is this treated? This condition cannot be cured, but it can be managed with medicines that replace cortisol and aldosterone. You may need to take these medicines: Several times a day, by mouth. Through an injection if you become so sick that you are unable to take these medicines  by mouth or you are unable to keep them down. Illness, stress, and surgery can increase your body's need for cortisol. It is very important that you talk with your health care  provider and understand how to adjust your medicine dosages if you become ill or stressed or if you are going to have surgery. Follow these instructions at home: Medicines Take over-the-counter and prescription medicines only as told by your health care provider. Know how to increase your medicine dosage during periods of stress, mild illness, or surgery. General instructions  When you travel, carry a needle, a syringe, and an injectable form of cortisol in case of an emergency. In case of an emergency, wear a medical alert bracelet or neck chain so that others understand that you have Addison's disease. Carry an ID card that states that you have Addison's disease. The card should include: Instructions to inject a certain amount of medicine if you are severely hurt or cannot respond. The name and phone number of your health care provider. The name and phone number of your closest relative. Keep all follow-up visits. This is important. Contact a health care provider if: You get sick with another illness. You develop new symptoms. Get help right away if: You have a severe infection or other illness. You have severe vomiting or diarrhea. You find it necessary to give yourself injectable medicine. You have symptoms of an addisoniancrisis. These symptoms include: Sudden, severe pain in the lower back, abdomen, or legs. Severe vomiting and diarrhea. Dehydration. Low blood pressure. Loss of consciousness. Summary Addison's disease is the inability of the adrenal glands to make hormones that regulate everyday functions of the body. If untreated, this condition can lead to life-threatening problems. It is very important that you talk with your health care provider and understand how to adjust your medicine dosages if you become ill or stressed or if you are going to have surgery. Take over-the-counter and prescription medicines only as told by your health care provider. Keep all follow-up  visits. This is important. This information is not intended to replace advice given to you by your health care provider. Make sure you discuss any questions you have with your health care provider. Document Revised: 12/24/2019 Document Reviewed: 12/24/2019 Elsevier Patient Education  [place].

## 2022-02-04 NOTE — Patient Outreach (Signed)
  Care Coordination   Initial Visit Note   02/04/2022 Name: [name] MRN: [id number] DOB: 1950/09/13  [name] is a 71 y.o. year old male who sees Venia Carbon, MD for primary care. I spoke with  [name]  and spouse [name] by phone today. Patient gave verbal authorization to speak with his wife, Jayen Klein regarding his personal health information.  What matters to the patients health and wellness today?  Patient reports having hospital admission from 01/24/22-01/26/22 due to low blood pressure. He reports he was recently diagnosed with Addison's disease.  Patient states his major concern is managing his low blood pressure caused by the Addison's disease.   Patient states he was advised by his doctor to hold his losartan due to the low blood pressures. He reports today's blood pressure was 118/71.  Patient states he is taking taking Cortef as prescribed.   He reports his cardiologist recently prescribed him [place].  He states he is not taking this due to cost.  Patient states he left a message with his cardiologist office informing them he is unable to afford medication and requested an alternative treatment.     Goals Addressed             This Visit's Progress    Patient Stated: Manage my Addison's condition and low blood pressure       Care Coordination Interventions: Evaluation of current treatment plan related to Addison's disease and patient's adherence to plan as established by provider Advised patient to notify provider for ongoing low blood pressures or increase in symptoms Reviewed medications with patient and discussed importance of compliance Provided patient with educational materials related to Addison's disease Reviewed scheduled/upcoming provider appointments Discussed plans with patient for ongoing care management follow up and provided patient with direct contact information for care management team Discussed Addison's condition and  related signs/ symptoms Discussed healthy diet for management of Addison's Advised patient to stay hydrated and encouraged patient to drink electrolyte as recommended by provider Advised to monitor blood pressure daily and record. For symptoms of lightheadedness:  sit or lie down.  Can raise your legs when sitting or lying down.  Rise slow from sitting or lying position to standing.           SDOH assessments and interventions completed:  Yes  SDOH Interventions Today    Flowsheet Row Most Recent Value  SDOH Interventions   Food Insecurity Interventions Intervention Not Indicated  Housing Interventions Intervention Not Indicated  Transportation Interventions Intervention Not Indicated        Care Coordination Interventions Activated:  Yes  Care Coordination Interventions:  Yes, provided   Follow up plan: Follow up call scheduled for 03/03/22 at 9:30 am    Encounter Outcome:  Pt. Visit Completed   Quinn Plowman RN,BSN,CCM [place] [phone] direct line

## 2022-02-04 NOTE — Telephone Encounter (Signed)
Okay---he saw him so I defer to him. I had some concerns about his heart rate being slow---but it was one or the other to stop

## 2022-02-05 ENCOUNTER — Encounter: Payer: Self-pay | Admitting: *Deleted

## 2022-02-05 NOTE — Telephone Encounter (Signed)
Can we reach out to patient and see what medication for blood pressure he is or is not taking. I have sent a message to his Cardiologist

## 2022-02-05 NOTE — Telephone Encounter (Signed)
Patient advised. Patient states he has stopped Losartan as directed. He did not take carvedilol last night because his b/p was around 90/50s. This morning b/p was 128/77, took Carvedilol, mid afternoon b/p was 128-130/70s. He will continue Carvedilol and will take the second dose of the day tonight and will keep an eye on the b/p readings over the weekend. Patient is feeling good today no issues. Patient is aware we will call him once we hear from cardiologist.

## 2022-02-05 NOTE — Telephone Encounter (Signed)
Cadence- see MyChart message please!

## 2022-02-05 NOTE — Telephone Encounter (Signed)
[facility] is unable to see the patient until March/April 2024  Other options are that the patient can go to [facility] or [place].  Everywhere is booked out pretty far. Most luck has been with the [place] [facility] office and [facility].   I will reach out to the patient.

## 2022-02-05 NOTE — Telephone Encounter (Signed)
Patient is calling for an update on his medications. He states he called in earlier this week but has not heard back from anyone.   Pt states he is doing fine, not having any acute symptoms. Just wanting to speak with someone before the weekend.   Thanks!

## 2022-02-05 NOTE — Telephone Encounter (Signed)
Referral has been sent to [facility] - they will contact the patient to schedule.   The patient is aware of where to call if he does hear from their office within 1-2 weeks at most.

## 2022-02-05 NOTE — Telephone Encounter (Signed)
Called [facility] to see if able to work this patient in. Office is closed from 12-1pm for lunch. I sent a Private Message to the office to see if able to see this patient. Awaiting Response.   See referral notes for updates.

## 2022-02-08 NOTE — Telephone Encounter (Signed)
Patient advised.

## 2022-02-08 NOTE — Telephone Encounter (Signed)
I have heard back from Dr. Rockey Situ and he is ok with the stopping of the losartan

## 2022-02-08 NOTE — Telephone Encounter (Signed)
Antony Madura, PA-C  Sent: Mon February 08, 2022 10:56 AM  To: Emily Filbert, RN          Message  If patient does not qualify for [place], we can start spiro 12.'5mg'$  daily, BMET in 2 weeks.

## 2022-02-11 ENCOUNTER — Other Ambulatory Visit
Admission: RE | Admit: 2022-02-11 | Discharge: 2022-02-11 | Disposition: A | Discharge status: Home / Self Care | Payer: Medicare Other | Attending: Medical | Admitting: Medical

## 2022-02-11 DIAGNOSIS — I25119 Atherosclerotic heart disease of native coronary artery with unspecified angina pectoris: Secondary | ICD-10-CM | POA: Insufficient documentation

## 2022-02-11 DIAGNOSIS — I700 Atherosclerosis of aorta: Secondary | ICD-10-CM | POA: Diagnosis not present

## 2022-02-11 DIAGNOSIS — Z23 Encounter for immunization: Secondary | ICD-10-CM | POA: Diagnosis not present

## 2022-02-11 DIAGNOSIS — I5020 Unspecified systolic (congestive) heart failure: Secondary | ICD-10-CM | POA: Diagnosis not present

## 2022-02-11 DIAGNOSIS — I255 Ischemic cardiomyopathy: Secondary | ICD-10-CM | POA: Insufficient documentation

## 2022-02-11 LAB — BASIC METABOLIC PANEL
Anion gap: 6 (ref 5–15)
BUN: 12 mg/dL (ref 8–23)
CO2: 23 mmol/L (ref 22–32)
Calcium: 9.3 mg/dL (ref 8.9–10.3)
Chloride: 106 mmol/L (ref 98–111)
Creatinine, Ser: 0.99 mg/dL (ref 0.61–1.24)
GFR, Estimated: >60 mL/min (ref >60)
Glucose, Bld: 145 mg/dL — ABNORMAL HIGH (ref 70–99)
Potassium: 3.7 mmol/L (ref 3.5–5.1)
Sodium: 135 mmol/L (ref 135–145)

## 2022-02-12 ENCOUNTER — Telehealth: Payer: Self-pay | Admitting: Cardiovascular Disease

## 2022-02-12 ENCOUNTER — Ambulatory Visit
Admission: RE | Admit: 2022-02-12 | Discharge: 2022-02-12 | Disposition: A | Discharge status: Home / Self Care | Payer: Medicare Other | Source: Ambulatory Visit | Attending: Medical | Admitting: Medical

## 2022-02-12 DIAGNOSIS — I5020 Unspecified systolic (congestive) heart failure: Secondary | ICD-10-CM | POA: Diagnosis not present

## 2022-02-12 DIAGNOSIS — I255 Ischemic cardiomyopathy: Secondary | ICD-10-CM | POA: Diagnosis not present

## 2022-02-12 LAB — NM MYOCAR MULTI W/SPECT W/WALL MOTION / EF
Base ST Depression (mm): 0 mm
LV dias vol: 131 mL (ref 62–150)
LV sys vol: 57 mL
Peak HR: 71 {beats}/min
Percent HR: 47 %
Rest HR: 60 {beats}/min
Rest Nuclear Isotope Dose: 10.5 mCi
SDS: 3
SRS: 24
SSS: 27
ST Depression (mm): 0 mm
Stress Nuclear Isotope Dose: 31 mCi
TID: 0.98

## 2022-02-12 MED ORDER — TECHNETIUM TC 99M TETROFOSMIN IV KIT
30.9700 | PACK | Freq: Once | INTRAVENOUS | Status: AC | PRN
  Administered 2022-02-12: 30.97 via INTRAVENOUS

## 2022-02-12 MED ORDER — TECHNETIUM TC 99M TETROFOSMIN IV KIT
10.5100 | PACK | Freq: Once | INTRAVENOUS | Status: AC | PRN
  Administered 2022-02-12: 10.51 via INTRAVENOUS

## 2022-02-12 MED ORDER — REGADENOSON 0.4 MG/5ML IV SOLN
0.4000 mg | Freq: Once | INTRAVENOUS | Status: AC
  Administered 2022-02-12: 0.4 mg via INTRAVENOUS

## 2022-02-12 MED ORDER — CARVEDILOL 3.125 MG PO TABS: 3.1250 mg | ORAL_TABLET | Freq: Two times a day (BID) | ORAL | 0 refills | Status: DC

## 2022-02-12 NOTE — Telephone Encounter (Signed)
Requested Prescriptions   Signed Prescriptions Disp Refills   carvedilol (COREG) 3.125 MG tablet 180 tablet 0    Sig: Take 1 tablet (3.125 mg total) by mouth 2 (two) times daily.    Authorizing Provider: Antony Madura    Ordering User: Raelene Bott, Tanush Drees L

## 2022-02-12 NOTE — Telephone Encounter (Signed)
*  STAT* If patient is at the pharmacy, call can be transferred to refill team.   1. Which medications need to be refilled? (please list name of each medication and dose if known)   carvedilol (COREG) 3.125 MG tablet  2. Which pharmacy/location (including street and city if local pharmacy) is medication to be sent to?  [id number]- [place], [place] - [address]  3. Do they need a 30 day or 90 day supply?  90 day  Patient stated he only had a couple of tablets left.

## 2022-02-12 NOTE — Telephone Encounter (Signed)
Patient stated he would like to include his three sons by teleconference during his visit on 11/13.  Patient would like to know if this would be OK.

## 2022-02-22 ENCOUNTER — Encounter (INDEPENDENT_AMBULATORY_CARE_PROVIDER_SITE_OTHER): Payer: Self-pay

## 2022-02-24 ENCOUNTER — Encounter: Payer: Self-pay | Admitting: Internal Medicine

## 2022-02-24 ENCOUNTER — Ambulatory Visit (INDEPENDENT_AMBULATORY_CARE_PROVIDER_SITE_OTHER): Payer: Medicare Other | Admitting: Internal Medicine

## 2022-02-24 VITALS — BP 130/90 | HR 65 | Temp 97.0°F | Ht 64.0 in | Wt 163.4 lb

## 2022-02-24 DIAGNOSIS — Z0000 Encounter for general adult medical examination without abnormal findings: Secondary | ICD-10-CM

## 2022-02-24 DIAGNOSIS — I25119 Atherosclerotic heart disease of native coronary artery with unspecified angina pectoris: Secondary | ICD-10-CM

## 2022-02-24 DIAGNOSIS — E2740 Unspecified adrenocortical insufficiency: Secondary | ICD-10-CM

## 2022-02-24 DIAGNOSIS — I255 Ischemic cardiomyopathy: Secondary | ICD-10-CM | POA: Diagnosis not present

## 2022-02-24 DIAGNOSIS — J449 Chronic obstructive pulmonary disease, unspecified: Secondary | ICD-10-CM

## 2022-02-24 DIAGNOSIS — I5042 Chronic combined systolic (congestive) and diastolic (congestive) heart failure: Secondary | ICD-10-CM

## 2022-02-24 DIAGNOSIS — K219 Gastro-esophageal reflux disease without esophagitis: Secondary | ICD-10-CM | POA: Diagnosis not present

## 2022-02-24 NOTE — Progress Notes (Signed)
Subjective:    Patient ID: [name], male    DOB: 01-24-1951, 70 y.o.   MRN: [id number]  HPI Here for Medicare wellness visit and follow up of chronic health conditions Reviewed advanced directives Reviewed other doctors----Dr Lorel Monaco, Dr Dew--vascular, Dr Jerrilyn Cairo, Dr Rise Mu ([place] also for injections),  Hospitalized for MI in January and hypotension 10/23 No surgery Needs new eye exam---left eye not as good Hearing is okay--wife might disagree Regular beer No tobacco No falls No depression or anhedonia Some exercise--- and has [place] acres (garden, etc) Independent with instrumental ADLs No memory problems  He does check his BP every morning 122/66 this morning Still off the losartan Recommended to take farxiga by cardiology--but too much money No chest pain Typical DOE---stable No dizziness except one day (BP 96 systolic)--might not have drank enough Some left ankle edema--end of day--gone in AM Recent stress test okay--small reversible area and EF 30-44%  Is taking the hydrocortisone Hasn't gotten in with endocrine yet  Does see vascular surgeon Monitors aortic aneurysm No claudication now----does get right knee pain (sciatic?) Left PAD known  Does note cough with eating at times No heartburn on protonix No dysphagia No wheezing Current Outpatient Medications on File Prior to Visit  Medication Sig Dispense Refill   aspirin 81 MG tablet Take 81 mg by mouth daily.     carvedilol (COREG) 3.125 MG tablet Take 1 tablet (3.125 mg total) by mouth 2 (two) times daily. 180 tablet 0   clopidogrel (PLAVIX) 75 MG tablet TAKE 1 TABLET BY MOUTH EVERY DAY (Patient taking differently: Take 75 mg by mouth daily.) 90 tablet 2   cyanocobalamin (VITAMIN B12) 1000 MCG tablet Take 1,000 mcg by mouth daily.     ezetimibe (ZETIA) 10 MG tablet TAKE 1 TABLET (10 MG TOTAL) BY MOUTH DAILY. 90 tablet 1   gabapentin (NEURONTIN) 100 MG capsule Take 1 capsule (100 mg total)  by mouth 2 (two) times daily. 180 capsule 3   hydrocortisone (CORTEF) 10 MG tablet Take 1 tablet (10 mg total) by mouth daily. And 1/2 tab around suppertime 135 tablet 3   mirtazapine (REMERON) 15 MG tablet TAKE 1 TABLET BY MOUTH EVERYDAY AT BEDTIME (Patient taking differently: Take 15 mg by mouth at bedtime.) 90 tablet 3   nitroGLYCERIN (NITROSTAT) 0.4 MG SL tablet PLACE 1 TABLET UNDER THE TONGUE EVERY 5 MINUTES AS NEEDED FOR CHEST PAIN. (Patient taking differently: Place 0.4 mg under the tongue every 5 (five) minutes as needed for chest pain.) 25 tablet 2   pantoprazole (PROTONIX) 40 MG tablet Take 1 tablet (40 mg total) by mouth 2 (two) times daily. 180 tablet 3   ranolazine (RANEXA) 500 MG 12 hr tablet Take 1 tablet (500 mg total) by mouth 2 (two) times daily. 180 tablet 3   rosuvastatin (CRESTOR) 40 MG tablet TAKE 1 TABLET BY MOUTH EVERY DAY (Patient taking differently: Take 40 mg by mouth daily.) 90 tablet 3   No current facility-administered medications on file prior to visit.    Allergies  Allergen Reactions   Metoprolol Tartrate Hives and Itching   Quinolones Other (See Comments)    Contraindicated with AAA   Metoclopramide Nausea Only    Other reaction(s): Unknown    Past Medical History:  Diagnosis Date   AAA (abdominal aortic aneurysm) ([place])    a. Duplex 01/2012: stable infrarenal saccular AAA at 3.3cm x 3.2xm (f/u recommended 01/2013 per Dr. Rockey Situ)   Alcohol abuse    CAD (coronary artery  disease)    a. 1995 s/p CABG x 4: LIMA->LAD, VG->RI (known to be occluded), VG->AM->PDA;  b. 05/2002 Inf STEMI: VG->AM->PDA 100% treated w/ 2 BMS complicated by acute thrombosis req 3 BMS;  c. 07/2003 DES to  LAD & LCX (VG's to PDA & RI 100%);  d. 12/2010 Acute MI ([place]): DES to LCX & LM , LIMA ok,;  e.12/2011 Cath: LM/LCX stents ok , LIMA patent.   Cardiomyopathy ([place])    a. EF 35% by cath 2015.   Diverticulitis    1/06 Diverticulitis--CT of pelvis--diffuse sigmoid divertic   Dyspnea    GERD  (gastroesophageal reflux disease)    Hyperlipidemia    Hypertension    Myocardial infarction [facility])    PAD (peripheral artery disease) ([place])    a. external iliac and mesenteric stenosis noted by noninvasive imaging.   Renal artery stenosis ([place])    a. 03/2011 PTA and stenting of L RA. b. last duplex 2016 with stable 1-59% bilateral RAS, incidental >50% R EIA stenosis    Past Surgical History:  Procedure Laterality Date   [place]     8/09  Cath--vein graft occlusions which are old--no acute changes   CARDIAC CATHETERIZATION  11/13/2010   stent x 2 @ [place]   CARDIAC CATHETERIZATION  04/05/2014   stent placement    CARDIAC CATHETERIZATION  10/28/2017   CLOSED REDUCTION SHOULDER DISLOCATION     COLONOSCOPY WITH PROPOFOL N/A 12/22/2018   Procedure: COLONOSCOPY WITH PROPOFOL;  Surgeon: Jonathon Bellows, MD;  Location: [facility] ENDOSCOPY;  Service: Gastroenterology;  Laterality: N/A;   CORONARY ANGIOPLASTY  04/05/2014   stent placement OM 1   CORONARY ARTERY BYPASS GRAFT     CORONARY STENT PLACEMENT  7/12   2 stents--Promus element plus (everolimus eluting)--[facility] in [place] WIRE/FFR STUDY N/A 05/13/2021   Procedure: INTRAVASCULAR PRESSURE WIRE/FFR STUDY;  Surgeon: Early Osmond, MD;  Location: [place] CV LAB;  Service: Cardiovascular;  Laterality: N/A;   LEFT HEART CATH AND CORONARY ANGIOGRAPHY N/A 05/13/2021   Procedure: LEFT HEART CATH AND CORONARY ANGIOGRAPHY;  Surgeon: Early Osmond, MD;  Location: [place] CV LAB;  Service: Cardiovascular;  Laterality: N/A;   LEFT HEART CATH AND CORS/GRAFTS ANGIOGRAPHY N/A 10/28/2017   Procedure: LEFT HEART CATH AND CORS/GRAFTS ANGIOGRAPHY;  Surgeon: Leonie Man, MD;  Location: [place] CV LAB;  Service: Cardiovascular;  Laterality: N/A;   LEFT HEART CATHETERIZATION WITH CORONARY/GRAFT ANGIOGRAM N/A 01/04/2012   Procedure: LEFT HEART CATHETERIZATION WITH  Beatrix Fetters;  Surgeon: Burnell Blanks, MD;  Location: [facility] CATH LAB;  Service: Cardiovascular;  Laterality: N/A;   LEFT HEART CATHETERIZATION WITH CORONARY/GRAFT ANGIOGRAM N/A 04/05/2014   Procedure: LEFT HEART CATHETERIZATION WITH Beatrix Fetters;  Surgeon: Jettie Booze, MD;  Location: [facility] CATH LAB;  Service: Cardiovascular;  Laterality: N/A;   PERCUTANEOUS CORONARY STENT INTERVENTION (PCI-S)  04/05/2014   Procedure: PERCUTANEOUS CORONARY STENT INTERVENTION (PCI-S);  Surgeon: Jettie Booze, MD;  Location: [facility] CATH LAB;  Service: Cardiovascular;;  OM1   RENAL ANGIOGRAM N/A 04/16/2011   Procedure: RENAL ANGIOGRAM;  Surgeon: Burnell Blanks, MD;  Location: [facility] CATH LAB;  Service: Cardiovascular;  Laterality: N/A;   RENAL ARTERY STENT  03/2011 ?    Family History  Problem Relation Age of Onset   Coronary artery disease Mother        Died MI age 58   Hypertension Mother    Heart attack Mother  Coronary artery disease Sister        Living   Coronary artery disease Brother        Living   Coronary artery disease Father        Died MI age 60   Heart attack Father    Heart attack Maternal Grandfather    Diabetes Neg Hx    Cancer Neg Hx        prostate or colon    Social History   Socioeconomic History   Marital status: Married    Spouse name: Not on file   Number of children: 3   Years of education: Not on file   Highest education level: Not on file  Occupational History   Occupation: Geneticist, molecular (Animal nutritionist)    Comment: Retired  Tobacco Use   Smoking status: Former    Packs/day: 3.00    Years: 25.00    Total pack years: 75.00    Types: Cigarettes    Quit date: 05/28/1993    Years since quitting: 28.7    Passive exposure: Past   Smokeless tobacco: Never  Vaping Use   Vaping Use: Never used  Substance and Sexual Activity   Alcohol use: Yes    Alcohol/week: 42.0 standard drinks of alcohol    Types: 42 Cans of  beer per week   Drug use: No   Sexual activity: Not on file  Other Topics Concern   Not on file  Social History Narrative   Working on living will   No health care POA but requests wife--then children   Would accept resuscitation   Not sure about tube feeds   Social Determinants of Health   Financial Resource Strain: Not on file  Food Insecurity: No Food Insecurity (02/04/2022)   Hunger Vital Sign    Worried About Running Out of Food in the Last Year: Never true    Ran Out of Food in the Last Year: Never true  Transportation Needs: No Transportation Needs (02/04/2022)   PRAPARE - Hydrologist (Medical): No    Lack of Transportation (Non-Medical): No  Physical Activity: Not on file  Stress: Not on file  Social Connections: Not on file  Intimate Partner Violence: Not on file   Review of Systems Appetite is okay Weight fairly stable recently Sleep is variable. No daytime somnolence. No apnea per wife Wears seat belt No teeth No suspicious skin lesions--doesn't see derm No sig back or joint pains---sciatic pain is better Bowels move fine-no blood Voids okay    Objective:   Physical Exam Constitutional:      Appearance: Normal appearance.  HENT:     Mouth/Throat:     Comments: Edentulous No lesions Eyes:     Conjunctiva/sclera: Conjunctivae normal.     Pupils: Pupils are equal, round, and reactive to light.  Cardiovascular:     Rate and Rhythm: Normal rate and regular rhythm.     Heart sounds: No murmur heard.    No gallop.     Comments: Faint pulse on right Left foot warm but no palpable pulse Pulmonary:     Effort: Pulmonary effort is normal.     Breath sounds: No wheezing or rales.     Comments: Decreased breath sounds but clear Abdominal:     Palpations: Abdomen is soft.     Tenderness: There is no abdominal tenderness.  Musculoskeletal:     Cervical back: Neck supple.     Right lower leg:  No edema.     Left lower leg: No  edema.  Lymphadenopathy:     Cervical: No cervical adenopathy.  Skin:    Findings: No lesion or rash.  Neurological:     General: No focal deficit present.     Mental Status: He is alert and oriented to person, place, and time.     Comments: Mini-cog normal  Psychiatric:        Mood and Affect: Mood normal.        Behavior: Behavior normal.            Assessment & Plan:

## 2022-02-24 NOTE — Assessment & Plan Note (Signed)
BP better now No sig dizziness On hydrocortisone 10/5 Awaiting endocrine evaluation

## 2022-02-24 NOTE — Assessment & Plan Note (Signed)
I have personally reviewed the Medicare Annual Wellness questionnaire and have noted 1. The patient's medical and social history 2. Their use of alcohol, tobacco or illicit drugs 3. Their current medications and supplements 4. The patient's functional ability including ADL's, fall risks, home safety risks and hearing or visual             impairment. 5. Diet and physical activities 6. Evidence for depression or mood disorders  The patients weight, height, BMI and visual acuity have been recorded in the chart I have made referrals, counseling and provided education to the patient based review of the above and I have provided the pt with a written personalized care plan for preventive services.  I have provided you with a copy of your personalized plan for preventive services. Please take the time to review along with your updated medication list.  Tries to stay active Colon due now--he is not certain he wants to proceed No PSA due to age Had flu and updated COVID vaccines Consider shingrix at pharmacy

## 2022-02-24 NOTE — Assessment & Plan Note (Signed)
Compensated No diuretic or ARB now due to hypotension

## 2022-02-24 NOTE — Assessment & Plan Note (Signed)
Doing well now No recent angina on the ranolazine 500 bid Plavix, coreg 3.125 bid, zetia/crestor

## 2022-02-24 NOTE — Progress Notes (Signed)
Vision Screening   Right eye Left eye Both eyes  Without correction     With correction '20/25 20/70 20/25 '$  Hearing Screening - Comments:: Pt passed whisper test

## 2022-02-24 NOTE — Assessment & Plan Note (Signed)
Controlled with the pantoprazole 40 daily

## 2022-02-24 NOTE — Assessment & Plan Note (Signed)
Mild No rx

## 2022-03-03 ENCOUNTER — Ambulatory Visit: Payer: Self-pay

## 2022-03-03 NOTE — Patient Outreach (Signed)
  Care Coordination   Follow Up Visit Note   03/03/2022 Name: [name] MRN: [id number] DOB: [birth year]  [name] is a 71 y.o. year old male who sees Venia Carbon, MD for primary care. I spoke with  [name] by phone today.  What matters to the patients health and wellness today?  Patient reports his blood pressures are doing much better. He states they are ranging from 110/60's to 130's/80's.   Patient states the dizziness has resolved.  Patient states he is drinking a gatorade or power aid zero daily along with water to remain hydrated.  Patient reports having a recent stress test and states he is scheduled to follow up with the cardiologist in approximately 1 week.   He states he is scheduled for a new patient visit with the endocrinologist on 03/25/22.     Goals Addressed             This Visit's Progress    Patient Stated: Manage my Addison's condition and low blood pressure       Care Coordination Interventions: Evaluation of current treatment plan related to Addison's disease and patient's adherence to plan as established by provider Advised patient to notify provider for ongoing low blood pressures or increase in symptoms Reviewed medications with patient and discussed importance of compliance Reviewed scheduled/upcoming provider appointments Discussed home blood pressure readings and condition of previous symptoms Advised patient to continue to stay hydrated and encouraged patient to drink electrolyte as recommended by provider Advised to continue to  monitor blood pressure daily and record.  Discussed signs/ symptoms of Heart failure.  Advised patient to continue to weigh daily and record.  Report weight gain of 3 lbs overnight or 5 lbs in a week to provider along with other discussed symptoms.          SDOH assessments and interventions completed:  No     Care Coordination Interventions Activated:  Yes  Care Coordination Interventions:  Yes,  provided   Follow up plan: Follow up call scheduled for 03/31/22 at 9:30 am    Encounter Outcome:  Pt. Visit Completed   Quinn Plowman RN,BSN,CCM [place] [phone] direct line

## 2022-03-08 ENCOUNTER — Ambulatory Visit: Payer: Medicare Other | Attending: Medical | Admitting: Medical

## 2022-03-08 ENCOUNTER — Encounter: Payer: Self-pay | Admitting: Medical

## 2022-03-08 VITALS — BP 154/79 | HR 59 | Ht 63.0 in | Wt 169.4 lb

## 2022-03-08 DIAGNOSIS — I25119 Atherosclerotic heart disease of native coronary artery with unspecified angina pectoris: Secondary | ICD-10-CM | POA: Diagnosis not present

## 2022-03-08 DIAGNOSIS — I739 Peripheral vascular disease, unspecified: Secondary | ICD-10-CM | POA: Diagnosis present

## 2022-03-08 DIAGNOSIS — I255 Ischemic cardiomyopathy: Secondary | ICD-10-CM | POA: Insufficient documentation

## 2022-03-08 DIAGNOSIS — F1010 Alcohol abuse, uncomplicated: Secondary | ICD-10-CM | POA: Insufficient documentation

## 2022-03-08 DIAGNOSIS — I5020 Unspecified systolic (congestive) heart failure: Secondary | ICD-10-CM | POA: Diagnosis not present

## 2022-03-08 DIAGNOSIS — I10 Essential (primary) hypertension: Secondary | ICD-10-CM | POA: Insufficient documentation

## 2022-03-08 MED ORDER — SPIRONOLACTONE 25 MG PO TABS: 12.5000 mg | ORAL_TABLET | Freq: Every day | ORAL | 1 refills | Status: DC

## 2022-03-08 NOTE — Progress Notes (Signed)
Cardiology Office Note:    Date:  03/08/2022   ID:  [name], DOB 1950/07/10, MRN [id number]  PCP:  [name] Carbon, MD  [facility] Cardiologist:  [name] Rogue, MD  [place] Electrophysiologist:  None   Referring MD: [name] Carbon, MD   Chief Complaint: 1 month follow-up  History of Present Illness:    [name] is a 71 y.o. male with a hx of CAD status post three-vessel CABG in 1995 with multiple subsequent PCI's beginning with an inferior STEMI in 2004 with unclear intervention, HFrEF secondary to ICM, diffuse PAD status post left renal artery stenting, enlarging infrarenal AAA measuring 4.6 cm by ultrasound in 02/2021, DM, HTN, HLD, remote tobacco use, and alcohol use who presents for 1 month follow-up.   LHC from 2019 showed CTO's of the ostial RCA, mid LAD, SVG to OM to PDA and SVG to OM, with a widely patent LIMA to LAD that retrograde filled the CTO with antegrade flow brisk to the apex and 40% proximal left main stenosis which was a stable lesion just prior to a previously placed stent followed by widely patent stents running from the proximal left main into the LCx-OM with brisk distal flow and collaterals to RPL system.  Echo at that time showed an EF of 42%, grade 1 diastolic dysfunction, mild LVH, akinesis of the basal inferior and basal inferolateral myocardium, severe hypokinesis in the mid inferior and mid inferolateral myocardium, and mildly reduced RV systolic function.   He was admitted to the hospital from 1/18 through 1/20 with an NSTEMI.  High-sensitivity troponin peaked at 1256.  LHC on 05/13/2021 showed diffuse underlying native vessel CAD with high-grade proximal LAD lesion which perfused a large septal branch, the LIMA to LAD did not backfill the septal branch.  There was failed PCI due to inability to cross and likely represented CTO.  There was moderate in-stent restenosis of the proximal LCx with an RFR of 0.92, PCI was deferred.   Sequential vein graft to OM and PDA along with vein graft to OM, native RCA were not imaged as they were known to be occluded.  LVEDP 8 mmHg.  Of note, there was an absent left radial pulse with recommendation for future cardiac cath to be pursued via right radial or femoral artery approaches.  Medical therapy was recommended.  Echo during that admission showed an EF of 45 to 50%, akinesis of the basal inferior, inferolateral, and inferoseptal walls, grade 1 diastolic dysfunction, normal RV systolic function and ventricular cavity size, no significant valvular abnormalities, and an estimated right atrial pressure of 3 mmHg.  He was initiated on ranolazine 500 mg twice daily.   The patient was admitted for dizziness with associated low BP, suspected it was from adrenal insufficieny. He was placed on decadrone with improvement of symptoms. Echo showed a drop in EF 45-50%> 30-35%. Cardiology consult was offered, but he wanted to go home.   Last seen 01/29/22 and reported he was diagnosed with Addison's disease and was placed on Cortef. He was started on Farxiga, however unable to afford this.  Repeat heart cath was discussed, however was agreed to start with a Myoview Lexiscan.  Myoview Lexiscan 02/12/2022 showed ischemia with prior myocardial infarction, defect 1 : large defect with absent uptake in the mid to basal inferior and anteroseptal locations that is fixed.  There is abnormal wall motion in the defect area.  Consistent with infarction in the RCA distribution.  Defect 2: Small defect  with mild reduction in uptake present in the basal anterior location that is reversible.  There is normal wall motion in the defect area.  Consistent with ischemia.  EF estimated 30 to 44%.  Overall this was an intermediate risk study.  Today, the patient reports he is overall doing Ok. Myoview lexiscan was reviewed. He denies chest pain to shortness of breath.  Patient is not interested in the [place] assistance plan.   Patient is willing to try spironolactone.  Patient denies lower leg edema, orthopnea, PND.  As of questions regarding medication.  Blood pressure is high today, however patient reports systolic of 867 at home.  Past Medical History:  Diagnosis Date   AAA (abdominal aortic aneurysm) ([place])    a. Duplex 01/2012: stable infrarenal saccular AAA at 3.3cm x 3.2xm (f/u recommended 01/2013 per Dr. Rockey Situ)   Alcohol abuse    CAD (coronary artery disease)    a. 1995 s/p CABG x 4: LIMA->LAD, VG->RI (known to be occluded), VG->AM->PDA;  b. 05/2002 Inf STEMI: VG->AM->PDA 100% treated w/ 2 BMS complicated by acute thrombosis req 3 BMS;  c. 07/2003 DES to  LAD & LCX (VG's to PDA & RI 100%);  d. 12/2010 Acute MI ([place]): DES to LCX & LM , LIMA ok,;  e.12/2011 Cath: LM/LCX stents ok , LIMA patent.   Cardiomyopathy ([place])    a. EF 35% by cath 2015.   Diverticulitis    1/06 Diverticulitis--CT of pelvis--diffuse sigmoid divertic   Dyspnea    GERD (gastroesophageal reflux disease)    Hyperlipidemia    Hypertension    Myocardial infarction [facility])    PAD (peripheral artery disease) ([place])    a. external iliac and mesenteric stenosis noted by noninvasive imaging.   Renal artery stenosis ([place])    a. 03/2011 PTA and stenting of L RA. b. last duplex 2016 with stable 1-59% bilateral RAS, incidental >50% R EIA stenosis    Past Surgical History:  Procedure Laterality Date   [place]     8/09  Cath--vein graft occlusions which are old--no acute changes   CARDIAC CATHETERIZATION  11/13/2010   stent x 2 @ [place]   CARDIAC CATHETERIZATION  04/05/2014   stent placement    CARDIAC CATHETERIZATION  10/28/2017   CLOSED REDUCTION SHOULDER DISLOCATION     COLONOSCOPY WITH PROPOFOL N/A 12/22/2018   Procedure: COLONOSCOPY WITH PROPOFOL;  Surgeon: [name] Bellows, MD;  Location: [facility] ENDOSCOPY;  Service: Gastroenterology;  Laterality: N/A;   CORONARY ANGIOPLASTY  04/05/2014   stent placement OM 1    CORONARY ARTERY BYPASS GRAFT     CORONARY STENT PLACEMENT  7/12   2 stents--Promus element plus (everolimus eluting)--[facility] in [place] WIRE/FFR STUDY N/A 05/13/2021   Procedure: INTRAVASCULAR PRESSURE WIRE/FFR STUDY;  Surgeon: Early Osmond, MD;  Location: [place] CV LAB;  Service: Cardiovascular;  Laterality: N/A;   LEFT HEART CATH AND CORONARY ANGIOGRAPHY N/A 05/13/2021   Procedure: LEFT HEART CATH AND CORONARY ANGIOGRAPHY;  Surgeon: Early Osmond, MD;  Location: [place] CV LAB;  Service: Cardiovascular;  Laterality: N/A;   LEFT HEART CATH AND CORS/GRAFTS ANGIOGRAPHY N/A 10/28/2017   Procedure: LEFT HEART CATH AND CORS/GRAFTS ANGIOGRAPHY;  Surgeon: Leonie Man, MD;  Location: [place] CV LAB;  Service: Cardiovascular;  Laterality: N/A;   LEFT HEART CATHETERIZATION WITH CORONARY/GRAFT ANGIOGRAM N/A 01/04/2012   Procedure: LEFT HEART CATHETERIZATION WITH Beatrix Fetters;  Surgeon: Burnell Blanks, MD;  Location: [place] CATH LAB;  Service: Cardiovascular;  Laterality: N/A;   LEFT HEART CATHETERIZATION WITH CORONARY/GRAFT ANGIOGRAM N/A 04/05/2014   Procedure: LEFT HEART CATHETERIZATION WITH Beatrix Fetters;  Surgeon: Jettie Booze, MD;  Location: [facility] CATH LAB;  Service: Cardiovascular;  Laterality: N/A;   PERCUTANEOUS CORONARY STENT INTERVENTION (PCI-S)  04/05/2014   Procedure: PERCUTANEOUS CORONARY STENT INTERVENTION (PCI-S);  Surgeon: Jettie Booze, MD;  Location: [facility] CATH LAB;  Service: Cardiovascular;;  OM1   RENAL ANGIOGRAM N/A 04/16/2011   Procedure: RENAL ANGIOGRAM;  Surgeon: Burnell Blanks, MD;  Location: [facility] CATH LAB;  Service: Cardiovascular;  Laterality: N/A;   RENAL ARTERY STENT  03/2011 ?    Current Medications: Current Meds  Medication Sig   aspirin 81 MG tablet Take 81 mg by mouth daily.   carvedilol (COREG) 3.125 MG tablet Take 1 tablet (3.125 mg total) by mouth 2 (two) times daily.    clopidogrel (PLAVIX) 75 MG tablet TAKE 1 TABLET BY MOUTH EVERY DAY (Patient taking differently: Take 75 mg by mouth daily.)   cyanocobalamin (VITAMIN B12) 1000 MCG tablet Take 1,000 mcg by mouth daily.   ezetimibe (ZETIA) 10 MG tablet TAKE 1 TABLET (10 MG TOTAL) BY MOUTH DAILY.   gabapentin (NEURONTIN) 100 MG capsule Take 1 capsule (100 mg total) by mouth 2 (two) times daily.   hydrocortisone (CORTEF) 10 MG tablet Take 1 tablet (10 mg total) by mouth daily. And 1/2 tab around suppertime   mirtazapine (REMERON) 15 MG tablet TAKE 1 TABLET BY MOUTH EVERYDAY AT BEDTIME (Patient taking differently: Take 15 mg by mouth at bedtime.)   nitroGLYCERIN (NITROSTAT) 0.4 MG SL tablet PLACE 1 TABLET UNDER THE TONGUE EVERY 5 MINUTES AS NEEDED FOR CHEST PAIN. (Patient taking differently: Place 0.4 mg under the tongue every 5 (five) minutes as needed for chest pain.)   pantoprazole (PROTONIX) 40 MG tablet Take 1 tablet (40 mg total) by mouth 2 (two) times daily.   ranolazine (RANEXA) 500 MG 12 hr tablet Take 1 tablet (500 mg total) by mouth 2 (two) times daily.   rosuvastatin (CRESTOR) 40 MG tablet TAKE 1 TABLET BY MOUTH EVERY DAY (Patient taking differently: Take 40 mg by mouth daily.)   spironolactone (ALDACTONE) 25 MG tablet Take 0.5 tablets (12.5 mg total) by mouth daily.     Allergies:   Metoprolol tartrate, Quinolones, and Metoclopramide   Social History   Socioeconomic History   Marital status: Married    Spouse name: Not on file   Number of children: 3   Years of education: Not on file   Highest education level: Not on file  Occupational History   Occupation: Geneticist, molecular (Animal nutritionist)    Comment: Retired  Tobacco Use   Smoking status: Former    Packs/day: 3.00    Years: 25.00    Total pack years: 75.00    Types: Cigarettes    Quit date: 05/28/1993    Years since quitting: 28.7    Passive exposure: Past   Smokeless tobacco: Never  Vaping Use   Vaping Use: Never used  Substance  and Sexual Activity   Alcohol use: Yes    Alcohol/week: 42.0 standard drinks of alcohol    Types: 42 Cans of beer per week   Drug use: No   Sexual activity: Not on file  Other Topics Concern   Not on file  Social History Narrative   Working on living will   No health care POA but requests wife--then children  Would accept resuscitation   Not sure about tube feeds   Social Determinants of Health   Financial Resource Strain: Not on file  Food Insecurity: No Food Insecurity (02/04/2022)   Hunger Vital Sign    Worried About Running Out of Food in the Last Year: Never true    Ran Out of Food in the Last Year: Never true  Transportation Needs: No Transportation Needs (02/04/2022)   PRAPARE - Hydrologist (Medical): No    Lack of Transportation (Non-Medical): No  Physical Activity: Not on file  Stress: Not on file  Social Connections: Not on file     Family History: The patient's family history includes Coronary artery disease in his brother, father, mother, and sister; Heart attack in his father, maternal grandfather, and mother; Hypertension in his mother. There is no history of Diabetes or Cancer.  ROS:   Please see the history of present illness.     All other systems reviewed and are negative.  EKGs/Labs/Other Studies Reviewed:    The following studies were reviewed today:   Echo 01/25/22  1. Left ventricular ejection fraction, by estimation, is 30 to 35%. The  left ventricle has moderately decreased function. The left ventricle  demonstrates regional wall motion abnormalities (see scoring  diagram/findings for description). The left  ventricular internal cavity size was mildly dilated. Left ventricular  diastolic parameters are consistent with Grade I diastolic dysfunction  (impaired relaxation).   2. Right ventricular systolic function is moderately reduced. The right  ventricular size is normal. Tricuspid regurgitation signal is  inadequate  for assessing PA pressure.   3. The mitral valve is normal in structure. Trivial mitral valve  regurgitation.   4. The aortic valve is tricuspid. Aortic valve regurgitation is not  visualized. Aortic valve sclerosis/calcification is present, without any  evidence of aortic stenosis.   5. The inferior vena cava is normal in size with greater than 50%  respiratory variability, suggesting right atrial pressure of 3 mmHg.    Echo 04/2021  1. Akinesis of the basal inferior, inferolateral and inferoseptal walls  with overall mild LV dysfunction.   2. Left ventricular ejection fraction, by estimation, is 45 to 50%. The  left ventricle has mildly decreased function. The left ventricle  demonstrates regional wall motion abnormalities (see scoring  diagram/findings for description). Left ventricular  diastolic parameters are consistent with Grade I diastolic dysfunction  (impaired relaxation).   3. Right ventricular systolic function is normal. The right ventricular  size is normal.   4. The mitral valve is normal in structure. No evidence of mitral valve  regurgitation. No evidence of mitral stenosis.   5. The aortic valve has an indeterminant number of cusps. Aortic valve  regurgitation is not visualized. No aortic stenosis is present.   6. The inferior vena cava is normal in size with greater than 50%  respiratory variability, suggesting right atrial pressure of 3 mmHg.    Cardiac cath 04/2021     Ost LM lesion is 45% stenosed.   Prox LAD-2 lesion is 100% stenosed.   Ost RCA to Dist RCA lesion is 100% stenosed.   Origin to Insertion lesion before Acute Mrg  is 100% stenosed.   Origin lesion is 100% stenosed.   Prox LAD-1 lesion is 99% stenosed.   Non-stenotic Mid LM to Dist LM lesion was previously treated.   Non-stenotic Ost Cx to Mid Cx lesion was previously treated.   Seq SVG- AVM-PDA due to known  occlusion.   LIMA and is normal in caliber.   SVG due to known  occlusion.   The graft exhibits no disease.   LV end diastolic pressure is normal.   1.  High-grade proximal LAD lesion which perfuses a large septal; the LIMA to the LAD does not backfill just septal.  Failed PCI due to inability to cross and likely represents a chronic total occlusion. 2.  Moderate in-stent restenosis of proximal left circumflex with RFR of 0.92; PCI was deferred. 3.  Sequential vein graft to OM to PDA, vein graft to OM,, and native right coronary artery were not imaged as they are known occluded. 4.  LVEDP of 8 mmHg 5.  Absent left radial pulse; future cardiac catheterizations should pursue a right radial or femoral approaches depending on clinical context.   Commendation: Intensive medical therapy.   Coronary Diagrams   Diagnostic Dominance: Right       EKG:  EKG is not ordered today.    Recent Labs: 01/24/2022: ALT 12 01/25/2022: TSH 1.342 01/26/2022: Hemoglobin 9.9; Magnesium 2.1; Platelets 266 02/11/2022: BUN 12; Creatinine, Ser 0.99; Potassium 3.7; Sodium 135  Recent Lipid Panel    Component Value Date/Time   CHOL 136 12/15/2021 0819   CHOL 163 08/11/2015 0941   TRIG 172.0 (H) 12/15/2021 0819   HDL 52.20 12/15/2021 0819   HDL 46 08/11/2015 0941   CHOLHDL 3 12/15/2021 0819   VLDL 34.4 12/15/2021 0819   LDLCALC 50 12/15/2021 0819   LDLCALC 63 01/07/2017 1120   LDLDIRECT 65.0 02/20/2021 1027    Physical Exam:    VS:  BP (!) 154/79 (BP Location: Left Arm, Patient Position: Sitting, Cuff Size: Normal)   Pulse (!) 59   Ht '5\' 3"'$  (1.6 m)   Wt 169 lb 6.4 oz (76.8 kg)   SpO2 100%   BMI 30.01 kg/m     Wt Readings from Last 3 Encounters:  03/08/22 169 lb 6.4 oz (76.8 kg)  02/24/22 163 lb 6 oz (74.1 kg)  02/03/22 167 lb 2 oz (75.8 kg)     GEN:  Well nourished, well developed in no acute distress HEENT: Normal NECK: No JVD; No carotid bruits LYMPHATICS: No lymphadenopathy CARDIAC: RRR, no murmurs, rubs, gallops RESPIRATORY:  Clear to auscultation  without rales, wheezing or rhonchi  ABDOMEN: Soft, non-tender, non-distended MUSCULOSKELETAL:  No edema; No deformity  SKIN: Warm and dry NEUROLOGIC:  Alert and oriented x 3 PSYCHIATRIC:  Normal affect   ASSESSMENT:    1. HFrEF (heart failure with reduced ejection fraction) ([place])   2. Coronary artery disease involving native coronary artery of native heart with angina pectoris ([place])   3. Essential hypertension   4. Ischemic cardiomyopathy   5. PAD (peripheral artery disease) ([place])   6. ETOH abuse    PLAN:    In order of problems listed above:  HFrEF ICM LVEF 45-50%-> 30-35% Patient was unable to afford [facility] [place], he is not interested in the patient assistance program.  Patient is euvolemic on exam.  Patient did not tolerate losartan in the past due to low blood pressure.  Continue Coreg 3.125 mg twice daily.  We will start spironolactone 12.5 mg daily, BMET in 1 to 2 weeks.  CAD post remote CABG The patient was admitted in January with a non-STEMI.  Left heart cath showed diffuse underlying native vessel CAD with high-grade proximal LAD lesion with perfused a large septal branch, the LIMA to LAD did not backfill.  There was failed PCI due  to inability to cross the lesion, and likely represented chronic total occlusion.  There was moderate in-stent restenosis of the proximal left circumflex with RFR of 0.92, PCI was deferred.  There was sequential vein graft to OM to PDA, vein graft to OM, and native right coronary artery were not imaged as they were known occluded.  Echo at that time showed LVEF of 45 to 50%.  Recent echo showed reduced EF 30 to 35%.  Recent Myoview Lexi scan showed ischemia with a large defect in the mid to basal inferior inferoseptal location that is fixed, consistent with RCA infarction.  There was another small defect with mild reduction in uptake present in the basal anterior locations that was reversible, consistent with ischemia.  LVEF was estimated at 30 to 44% patient  denies any anginal symptoms.  At this point, I will discuss with interventionalists the best options.  He may need to see CTO specialist in [place].  Continue aspirin, Coreg, Plavix, Zetia, Crestor, Ranexa, sublingual nitroglycerin.  PAD AAA is measuring 4.5 cm, this is closely monitored by vascular surgery.  Renal artery stenosis Status post left-sided stent.  ETOH use Patient is drinking 3 bursts daily.  Cessation is recommended.  Disposition: Follow up in 2 month(s) with MD/APP    Signed, Bryanna Yim Ninfa Meeker, PA-C  03/08/2022 9:41 AM    [facility]

## 2022-03-08 NOTE — Patient Instructions (Signed)
Medication Instructions:  START spironolactone 12.5 mg by mouth daily.   *If you need a refill on your cardiac medications before your next appointment, please call your pharmacy*   Lab Work: BMP to be drawn in 1-2 weeks. - Please go to the [facility]. You will check in at the front desk to the right as you walk into the atrium. Valet Parking is offered if needed. - No appointment needed. You may go any day between 7 am and 6 pm.  If you have labs (blood work) drawn today and your tests are completely normal, you will receive your results only by: [place] (if you have MyChart) OR A paper copy in the mail If you have any lab test that is abnormal or we need to change your treatment, we will call you to review the results.   Testing/Procedures: None ordered  Follow-Up: At [facility], you and your health needs are our priority.  As part of our continuing mission to provide you with exceptional heart care, we have created designated Provider Care Teams.  These Care Teams include your primary Cardiologist (physician) and Advanced Practice Providers (APPs -  Physician Assistants and Nurse Practitioners) who all work together to provide you with the care you need, when you need it.  We recommend signing up for the patient portal called "MyChart".  Sign up information is provided on this After Visit Summary.  MyChart is used to connect with patients for Virtual Visits (Telemedicine).  Patients are able to view lab/test results, encounter notes, upcoming appointments, etc.  Non-urgent messages can be sent to your provider as well.   To learn more about what you can do with MyChart, go to [email or web address].    Your next appointment:   2 month(s)  The format for your next appointment:   In Person  Provider:   You may see Ida Rogue, MD or one of the following Advanced Practice Providers on your designated Care Team:   Murray Hodgkins, NP Christell Faith,  PA-C Cadence Kathlen Mody, PA-C Gerrie Nordmann, NP   Important Information About [place]

## 2022-03-11 ENCOUNTER — Telehealth: Payer: Self-pay | Admitting: *Deleted

## 2022-03-11 NOTE — Telephone Encounter (Signed)
Forwarding to triage

## 2022-03-11 NOTE — Telephone Encounter (Signed)
Called patient and informed him of the recommendation below. Patient was very appreciative, and grateful for the call back.

## 2022-03-11 NOTE — Telephone Encounter (Signed)
-----   Message from [place], PA-C sent at 03/11/2022  7:44 AM EST ----- Can we please call the patient and let him know films were reviewed by Dr. Fletcher Anon, interventionalist and his response is below. He would not recommend repeat cardiac cath. And the abnormality in the stress test goes along with known disease. Continue with medications.  ----- Message ----- From: Wellington Hampshire, MD Sent: 03/09/2022   6:34 PM EST To: Cadence Ninfa Meeker, PA-C  I do not think there is much benefit of repeat cardiac catheterization.  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  The ischemia in the basal LAD is also expected.  The supplied area in the proximal LAD is relatively small and not worse repeat PCI.  If he is not having anginal symptoms, I do not recommend repeat cath.   ----- Message ----- From: Antony [place], PA-C Sent: 03/08/2022   3:31 PM EST To: Wellington Hampshire, MD  This is a [place] patient but I have an interventional question.  H/o CBAG and stenting NSTEMI with LHC in January, unable to cross a lesion and ISR. EF 45-50% Recent echo showed LVEF 30-35%. No anginal sxs. Myoview showed possible ischemia  I was wondering if you would recommend repeat heart cath, may need to see CTO specialist, or medications. Thanks

## 2022-03-15 ENCOUNTER — Encounter: Payer: Self-pay | Admitting: Cardiovascular Disease

## 2022-03-15 ENCOUNTER — Encounter: Payer: Self-pay | Admitting: Internal Medicine

## 2022-03-25 ENCOUNTER — Encounter: Payer: Self-pay | Admitting: "Endocrinology

## 2022-03-25 ENCOUNTER — Ambulatory Visit (INDEPENDENT_AMBULATORY_CARE_PROVIDER_SITE_OTHER): Payer: Medicare Other | Admitting: "Endocrinology

## 2022-03-25 VITALS — BP 112/72 | HR 56 | Ht 63.0 in | Wt 167.4 lb

## 2022-03-25 DIAGNOSIS — E2740 Unspecified adrenocortical insufficiency: Secondary | ICD-10-CM | POA: Diagnosis not present

## 2022-03-25 DIAGNOSIS — I255 Ischemic cardiomyopathy: Secondary | ICD-10-CM | POA: Diagnosis not present

## 2022-03-25 MED ORDER — DEXAMETHASONE 1 MG PO TABS: 1.0000 mg | ORAL_TABLET | Freq: Every day | ORAL | 0 refills | Status: DC

## 2022-03-25 NOTE — Progress Notes (Signed)
Endocrinology Consult Note                                            03/25/2022, 6:10 PM   Subjective:    Patient ID: [name], male    DOB: 1950-05-08, PCP Venia Carbon, MD   Past Medical History:  Diagnosis Date   AAA (abdominal aortic aneurysm) ([place])    a. Duplex 01/2012: stable infrarenal saccular AAA at 3.3cm x 3.2xm (f/u recommended 01/2013 per Dr. Rockey Situ)   Alcohol abuse    CAD (coronary artery disease)    a. 1995 s/p CABG x 4: LIMA->LAD, VG->RI (known to be occluded), VG->AM->PDA;  b. 05/2002 Inf STEMI: VG->AM->PDA 100% treated w/ 2 BMS complicated by acute thrombosis req 3 BMS;  c. 07/2003 DES to  LAD & LCX (VG's to PDA & RI 100%);  d. 12/2010 Acute MI ([place]): DES to LCX & LM , LIMA ok,;  e.12/2011 Cath: LM/LCX stents ok , LIMA patent.   Cardiomyopathy ([place])    a. EF 35% by cath 2015.   Diverticulitis    1/06 Diverticulitis--CT of pelvis--diffuse sigmoid divertic   Dyspnea    GERD (gastroesophageal reflux disease)    Hyperlipidemia    Hypertension    Myocardial infarction [facility])    PAD (peripheral artery disease) ([place])    a. external iliac and mesenteric stenosis noted by noninvasive imaging.   Renal artery stenosis ([place])    a. 03/2011 PTA and stenting of L RA. b. last duplex 2016 with stable 1-59% bilateral RAS, incidental >50% R EIA stenosis   Past Surgical History:  Procedure Laterality Date   [place]     8/09  Cath--vein graft occlusions which are old--no acute changes   CARDIAC CATHETERIZATION  11/13/2010   stent x 2 @ [place]   CARDIAC CATHETERIZATION  04/05/2014   stent placement    CARDIAC CATHETERIZATION  10/28/2017   CLOSED REDUCTION SHOULDER DISLOCATION     COLONOSCOPY WITH PROPOFOL N/A 12/22/2018   Procedure: COLONOSCOPY WITH PROPOFOL;  Surgeon: Jonathon Bellows, MD;  Location: [facility] ENDOSCOPY;  Service: Gastroenterology;  Laterality: N/A;   CORONARY ANGIOPLASTY  04/05/2014   stent placement OM 1    CORONARY ARTERY BYPASS GRAFT     CORONARY STENT PLACEMENT  7/12   2 stents--Promus element plus (everolimus eluting)--[facility] in [place] WIRE/FFR STUDY N/A 05/13/2021   Procedure: INTRAVASCULAR PRESSURE WIRE/FFR STUDY;  Surgeon: Early Osmond, MD;  Location: [place] CV LAB;  Service: Cardiovascular;  Laterality: N/A;   LEFT HEART CATH AND CORONARY ANGIOGRAPHY N/A 05/13/2021   Procedure: LEFT HEART CATH AND CORONARY ANGIOGRAPHY;  Surgeon: Early Osmond, MD;  Location: [place] CV LAB;  Service: Cardiovascular;  Laterality: N/A;   LEFT HEART CATH AND CORS/GRAFTS ANGIOGRAPHY N/A 10/28/2017   Procedure: LEFT HEART CATH AND CORS/GRAFTS ANGIOGRAPHY;  Surgeon: Leonie Man, MD;  Location: [place] CV LAB;  Service: Cardiovascular;  Laterality: N/A;   LEFT HEART CATHETERIZATION WITH CORONARY/GRAFT ANGIOGRAM N/A 01/04/2012   Procedure: LEFT HEART CATHETERIZATION WITH Beatrix Fetters;  Surgeon: Burnell Blanks, MD;  Location: [facility] CATH LAB;  Service: Cardiovascular;  Laterality: N/A;   LEFT HEART CATHETERIZATION WITH CORONARY/GRAFT ANGIOGRAM N/A 04/05/2014   Procedure: LEFT HEART CATHETERIZATION WITH CORONARY/GRAFT ANGIOGRAM;  Surgeon: Jettie Booze,  MD;  Location: [place] CATH LAB;  Service: Cardiovascular;  Laterality: N/A;   PERCUTANEOUS CORONARY STENT INTERVENTION (PCI-S)  04/05/2014   Procedure: PERCUTANEOUS CORONARY STENT INTERVENTION (PCI-S);  Surgeon: Jettie Booze, MD;  Location: [facility] CATH LAB;  Service: Cardiovascular;;  OM1   RENAL ANGIOGRAM N/A 04/16/2011   Procedure: RENAL ANGIOGRAM;  Surgeon: Burnell Blanks, MD;  Location: [facility] CATH LAB;  Service: Cardiovascular;  Laterality: N/A;   RENAL ARTERY STENT  03/2011 ?   Social History   Socioeconomic History   Marital status: Married    Spouse name: Not on file   Number of children: 3   Years of education: Not on file   Highest education level: Not on file   Occupational History   Occupation: Geneticist, molecular (Animal nutritionist)    Comment: Retired  Tobacco Use   Smoking status: Former    Packs/day: 3.00    Years: 25.00    Total pack years: 75.00    Types: Cigarettes    Quit date: 05/28/1993    Years since quitting: 28.8    Passive exposure: Past   Smokeless tobacco: Never  Vaping Use   Vaping Use: Never used  Substance and Sexual Activity   Alcohol use: Yes    Alcohol/week: 42.0 standard drinks of alcohol    Types: 42 Cans of beer per week   Drug use: No   Sexual activity: Not on file  Other Topics Concern   Not on file  Social History Narrative   Working on living will   No health care POA but requests wife--then children   Would accept resuscitation   Not sure about tube feeds   Social Determinants of Health   Financial Resource Strain: Not on file  Food Insecurity: No Food Insecurity (02/04/2022)   Hunger Vital Sign    Worried About Running Out of Food in the Last Year: Never true    Ran Out of Food in the Last Year: Never true  Transportation Needs: No Transportation Needs (02/04/2022)   PRAPARE - Hydrologist (Medical): No    Lack of Transportation (Non-Medical): No  Physical Activity: Not on file  Stress: Not on file  Social Connections: Not on file   Family History  Problem Relation Age of Onset   Coronary artery disease Mother        Died MI age 53   Hypertension Mother    Heart attack Mother    Coronary artery disease Sister        Living   Coronary artery disease Brother        Living   Coronary artery disease Father        Died MI age 17   Heart attack Father    Heart attack Maternal Grandfather    Diabetes Neg Hx    Cancer Neg Hx        prostate or colon   Outpatient Encounter Medications as of 03/25/2022  Medication Sig   [DISCONTINUED] dexamethasone (DECADRON) 1 MG tablet Take 1 tablet (1 mg total) by mouth daily with breakfast.   aspirin 81 MG tablet Take 81  mg by mouth daily.   carvedilol (COREG) 3.125 MG tablet Take 1 tablet (3.125 mg total) by mouth 2 (two) times daily.   clopidogrel (PLAVIX) 75 MG tablet TAKE 1 TABLET BY MOUTH EVERY DAY (Patient taking differently: Take 75 mg by mouth daily.)   cyanocobalamin (VITAMIN B12) 1000 MCG tablet Take 1,000 mcg by mouth  daily.   dexamethasone (DECADRON) 1 MG tablet Take 1 tablet (1 mg total) by mouth daily with breakfast.   ezetimibe (ZETIA) 10 MG tablet TAKE 1 TABLET (10 MG TOTAL) BY MOUTH DAILY.   gabapentin (NEURONTIN) 100 MG capsule Take 1 capsule (100 mg total) by mouth 2 (two) times daily.   mirtazapine (REMERON) 15 MG tablet TAKE 1 TABLET BY MOUTH EVERYDAY AT BEDTIME (Patient taking differently: Take 15 mg by mouth at bedtime.)   nitroGLYCERIN (NITROSTAT) 0.4 MG SL tablet PLACE 1 TABLET UNDER THE TONGUE EVERY 5 MINUTES AS NEEDED FOR CHEST PAIN. (Patient taking differently: Place 0.4 mg under the tongue every 5 (five) minutes as needed for chest pain.)   pantoprazole (PROTONIX) 40 MG tablet Take 1 tablet (40 mg total) by mouth 2 (two) times daily.   ranolazine (RANEXA) 500 MG 12 hr tablet Take 1 tablet (500 mg total) by mouth 2 (two) times daily.   rosuvastatin (CRESTOR) 40 MG tablet TAKE 1 TABLET BY MOUTH EVERY DAY (Patient taking differently: Take 40 mg by mouth daily.)   spironolactone (ALDACTONE) 25 MG tablet Take 0.5 tablets (12.5 mg total) by mouth daily.   [DISCONTINUED] hydrocortisone (CORTEF) 10 MG tablet Take 1 tablet (10 mg total) by mouth daily. And 1/2 tab around suppertime   No facility-administered encounter medications on file as of 03/25/2022.   ALLERGIES: Allergies  Allergen Reactions   Metoprolol Tartrate Hives and Itching   Quinolones Other (See Comments)    Contraindicated with AAA   Metoclopramide Nausea Only    Other reaction(s): Unknown    VACCINATION STATUS: Immunization History  Administered Date(s) Administered   COVID-19, mRNA, vaccine(Comirnaty)12 years and  older 02/11/2022   Fluad Quad(high Dose 65+) 02/16/2019, 02/11/2022   Influenza,inj,Quad PF,6+ Mos 12/19/2013, 12/31/2014, 01/05/2016, 01/07/2017, 02/09/2018, 02/19/2020   PFIZER Comirnaty(Gray Top)Covid-19 Tri-Sucrose Vaccine 10/06/2020   PFIZER(Purple Top)SARS-COV-2 Vaccination 06/16/2019, 07/10/2019   Pfizer Covid-19 Vaccine Bivalent Booster 87yr & up 02/23/2021   Pneumococcal Conjugate-13 01/05/2016   Pneumococcal Polysaccharide-23 12/19/2013, 02/19/2020   Td 12/26/2001   Tdap 12/13/2012    HPI BHALBERT JESSONis 71y.o. male who presents today with a medical history as above. he is being seen in consultation for insufficiency requested by LVenia Carbon MD.  He is accompanied by his wife to clinic. His medical record showed that on January 25, 2022 he visited emergency room for dizziness where he was found to have hypotension and dehydration.  Subsequent inpatient workup showed hypocortisolemia which required steroid replacement.  He was treated with dexamethasone for few days before he was switched to hydrocortisone 10 mg p.o. twice daily.  He has no acute symptoms now, reports stabilization of his blood pressure and no longer has dizziness or lightheadedness.  He denies any prior exposure to heavy dose steroids.  He denies any abdominal injury to the adrenals.  He did not have any prior knowledge of adrenal, thyroid, pituitary dysfunction.  He is a patient with coronary artery disease which required quadruple bypass in 1995.  He still has hypertension on medication management.  His medications currently include carvedilol 3.125 mg p.o. twice daily, spironolactone 12.5 mg p.o. daily. He also has hyperlipidemia currently managed by Crestor 40 mg p.o. nightly.  Records show A1c of 6.8%, patient not on antidiabetic medications.  He is aware of diabetes diagnosis.   Review of Systems  Constitutional:  + Minimally fluctuating body weight,  no fatigue, no subjective hyperthermia, no  subjective hypothermia Eyes: no blurry vision, no xerophthalmia  ENT: no sore throat, no nodules palpated in throat, no dysphagia/odynophagia, no hoarseness Cardiovascular: no Chest Pain, no Shortness of Breath, no palpitations, no leg swelling Respiratory: no cough, no shortness of breath Gastrointestinal: no Nausea/Vomiting/Diarhhea Musculoskeletal: no muscle/joint aches Skin: no rashes Neurological: no tremors, no numbness, no tingling, no dizziness Psychiatric: no depression, no anxiety  Objective:       03/25/2022    1:07 PM 03/08/2022    8:48 AM 02/24/2022   10:15 AM  Vitals with BMI  Height '5\' 3"'$  '5\' 3"'$  '5\' 4"'$   Weight 167 lbs 6 oz 169 lbs 6 oz 163 lbs 6 oz  BMI 29.66 81.19 14.78  Systolic 295 621 308  Diastolic 72 79 90  Pulse 56 59 65    BP 112/72   Pulse (!) 56   Ht '5\' 3"'$  (1.6 m)   Wt 167 lb 6.4 oz (75.9 kg)   BMI 29.65 kg/m   Wt Readings from Last 3 Encounters:  03/25/22 167 lb 6.4 oz (75.9 kg)  03/08/22 169 lb 6.4 oz (76.8 kg)  02/24/22 163 lb 6 oz (74.1 kg)    Physical Exam  Constitutional:  Body mass index is 29.65 kg/m.,  not in acute distress, normal state of mind Eyes: PERRLA, EOMI, no exophthalmos ENT: moist mucous membranes, no gross thyromegaly, no gross cervical lymphadenopathy Cardiovascular: normal precordial activity, Regular Rate and Rhythm, no Murmur/Rubs/Gallops Respiratory:  adequate breathing efforts, no gross chest deformity, Clear to auscultation bilaterally Gastrointestinal: abdomen soft, Non -tender, No distension, Bowel Sounds present, no gross organomegaly Musculoskeletal: no gross deformities, strength intact in all four extremities Skin: moist, warm, no rashes, + tanned skin Neurological: no tremor with outstretched hands, Deep tendon reflexes normal in bilateral lower extremities.  CMP ( most recent) CMP     Component Value Date/Time   NA 135 02/11/2022 0915   NA 132 (L) 06/04/2021 1110   K 3.7 02/11/2022 0915   CL 106  02/11/2022 0915   CO2 23 02/11/2022 0915   GLUCOSE 145 (H) 02/11/2022 0915   BUN 12 02/11/2022 0915   BUN 6 (L) 06/04/2021 1110   CREATININE 0.99 02/11/2022 0915   CREATININE 0.84 01/07/2017 1120   CALCIUM 9.3 02/11/2022 0915   PROT 6.4 (L) 01/24/2022 2210   PROT 7.2 08/11/2015 0941   ALBUMIN 3.5 01/24/2022 2210   ALBUMIN 4.5 08/11/2015 0941   AST 18 01/24/2022 2210   ALT 12 01/24/2022 2210   ALKPHOS 36 (L) 01/24/2022 2210   BILITOT 0.7 01/24/2022 2210   BILITOT 0.6 08/11/2015 0941   GFRNONAA >60 02/11/2022 0915   GFRAA >60 01/18/2020 1408     Diabetic Labs (most recent): Lab Results  Component Value Date   HGBA1C 6.8 (H) 05/14/2021   HGBA1C 5.8 (H) 04/04/2014     Lipid Panel ( most recent) Lipid Panel     Component Value Date/Time   CHOL 136 12/15/2021 0819   CHOL 163 08/11/2015 0941   TRIG 172.0 (H) 12/15/2021 0819   HDL 52.20 12/15/2021 0819   HDL 46 08/11/2015 0941   CHOLHDL 3 12/15/2021 0819   VLDL 34.4 12/15/2021 0819   LDLCALC 50 12/15/2021 0819   LDLCALC 63 01/07/2017 1120   LDLDIRECT 65.0 02/20/2021 1027   LABVLDL 45 (H) 08/11/2015 0941      Lab Results  Component Value Date   TSH 1.342 01/25/2022   TSH 2.73 12/15/2021   TSH 2.086 10/28/2017   TSH 4.010 04/04/2014   TSH 0.81 12/30/2010   TSH  1.58 09/02/2010   FREET4 0.83 01/05/2016   FREET4 0.90 12/31/2014   FREET4 1.01 04/04/2014           Assessment & Plan:   1. Adrenal insufficiency ([place])   - [name]  is being seen at a kind request of Venia Carbon, MD. - I have reviewed his available endocrine records and clinically evaluated the patient. 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  He still has 2 medications to manage hypertension and is no evidence of mineralocorticoid deficiency.  He also has type 2 diabetes not on treatment, hyperlipidemia on treatment. His workup for adrenal insufficiency is  incomplete.  To facilitate workup, I discussed and switched him back to dexamethasone 1 mg p.o. daily for 10 days followed by ACTH stimulation test and office visit in 15 days.  If he is determined to have adrenal insufficiency, he will be switched back to hydrocortisone twice a day to manage for life.  He will be approached with medical alert bracelet or necklace to wear for emergency. His A1c needs follow-up as well as his lipid profile.  In light of his history of quadruple coronary artery bypass, his risk factors need to be all controlled.   - he is advised to maintain close follow up with Venia Carbon, MD for primary care needs.   - Time spent with the patient: 50 minutes, of which >50% was spent in  counseling him about his adrenal insufficiency, hypertension, hyperlipidemia, type 2 diabetes and the rest in obtaining information about his symptoms, reviewing his previous labs/studies ( including abstractions from other facilities),  evaluations, and treatments,  and developing a plan to confirm diagnosis and long term treatment based on the latest standards of care/guidelines; and documenting his care.  [name] participated in the discussions, expressed understanding, and voiced agreement with the above plans.  All questions were answered to his satisfaction. he is encouraged to contact clinic should he have any questions or concerns prior to his return visit.  Follow up plan: Return in about 2 weeks (around 04/08/2022), or ACTH Stim Test after Dec 10, for F/U with Pre-visit Labs.   Glade Lloyd, MD [facility] [address] [place], [place] [address] Phone: [phone]  Fax: [phone]     03/25/2022, 6:10 PM  This note was partially dictated with voice recognition software. Similar sounding words can be transcribed inadequately or may not  be corrected upon review.

## 2022-03-26 ENCOUNTER — Other Ambulatory Visit
Admission: RE | Admit: 2022-03-26 | Discharge: 2022-03-26 | Disposition: A | Discharge status: Home / Self Care | Payer: Medicare Other | Attending: Medical | Admitting: Medical

## 2022-03-26 DIAGNOSIS — I10 Essential (primary) hypertension: Secondary | ICD-10-CM | POA: Diagnosis not present

## 2022-03-26 DIAGNOSIS — I25119 Atherosclerotic heart disease of native coronary artery with unspecified angina pectoris: Secondary | ICD-10-CM | POA: Diagnosis not present

## 2022-03-26 DIAGNOSIS — I5020 Unspecified systolic (congestive) heart failure: Secondary | ICD-10-CM

## 2022-03-26 LAB — BASIC METABOLIC PANEL
Anion gap: 8 (ref 5–15)
BUN: 9 mg/dL (ref 8–23)
CO2: 24 mmol/L (ref 22–32)
Calcium: 9.3 mg/dL (ref 8.9–10.3)
Chloride: 104 mmol/L (ref 98–111)
Creatinine, Ser: 0.9 mg/dL (ref 0.61–1.24)
GFR, Estimated: >60 mL/min (ref >60)
Glucose, Bld: 118 mg/dL — ABNORMAL HIGH (ref 70–99)
Potassium: 4.1 mmol/L (ref 3.5–5.1)
Sodium: 136 mmol/L (ref 135–145)

## 2022-03-31 ENCOUNTER — Other Ambulatory Visit: Payer: Self-pay

## 2022-03-31 ENCOUNTER — Ambulatory Visit: Payer: Self-pay

## 2022-03-31 NOTE — Patient Outreach (Signed)
  Care Coordination   Follow Up Visit Note   03/31/2022 Name: [name] MRN: [id number] DOB: [birth year]  [name] is a 71 y.o. year old male who sees Venia Carbon, MD for primary care. I spoke with  [name] by phone today.  What matters to the patients health and wellness today?  Ongoing management of Addison's condition and low blood pressure    Goals Addressed             This Visit's Progress    Patient Stated: Manage my Addison's condition and low blood pressure       Care Coordination Interventions: Evaluation of current treatment plan related to Addison's disease and patient's adherence to plan as established by provider.  Patient had recent visit with endocrinologist on 03/25/22 with medication adjustment.  Advised patient to notify provider for increase in blood pressure or symptoms.  Reviewed medications with patient and discussed importance of compliance Reviewed scheduled/upcoming provider appointments.  Next follow up with endocrinologist ist 04/09/22.   Discussed home blood pressure readings and recent lab results Advised patient to continue to stay hydrated and encouraged patient to drink electrolyte as recommended by provider Advised to continue to  monitor blood pressure daily and record.  Patient reports his blood pressures are ranging in the 130's/80's          SDOH assessments and interventions completed:  No     Care Coordination Interventions:  Yes, provided   Follow up plan: Follow up call scheduled for 05/21/21    Encounter Outcome:  Pt. Visit Completed   Quinn Plowman RN,BSN,CCM [place] [phone] direct line

## 2022-04-01 ENCOUNTER — Telehealth: Payer: Self-pay | Admitting: Medical

## 2022-04-01 MED ORDER — SPIRONOLACTONE 25 MG PO TABS: 12.5000 mg | ORAL_TABLET | Freq: Every day | ORAL | 0 refills | Status: DC

## 2022-04-01 NOTE — Telephone Encounter (Signed)
 *  STAT* If patient is at the pharmacy, call can be transferred to refill team.   1. Which medications need to be refilled? (please list name of each medication and dose if known) spironolactone (ALDACTONE) 25 MG tablet   2. Which pharmacy/location (including street and city if local pharmacy) is medication to be sent to? [facility] [place], [place]   3. Do they need a 30 day or 90 day supply? 90 days

## 2022-04-07 ENCOUNTER — Encounter (HOSPITAL_COMMUNITY)
Admission: RE | Admit: 2022-04-07 | Discharge: 2022-04-07 | Disposition: A | Discharge status: Home / Self Care | Payer: Medicare Other | Source: Ambulatory Visit | Attending: "Endocrinology | Admitting: "Endocrinology

## 2022-04-07 ENCOUNTER — Other Ambulatory Visit (HOSPITAL_COMMUNITY): Admission: RE | Admit: 2022-04-07 | Payer: Medicare Other | Source: Ambulatory Visit

## 2022-04-07 VITALS — BP 138/80 | HR 54 | Temp 97.5°F | Resp 18

## 2022-04-07 DIAGNOSIS — E2740 Unspecified adrenocortical insufficiency: Secondary | ICD-10-CM | POA: Insufficient documentation

## 2022-04-07 MED ORDER — COSYNTROPIN 0.25 MG IJ SOLR
0.2500 mg | Freq: Once | INTRAMUSCULAR | Status: AC
  Administered 2022-04-07: 0.25 mg via INTRAMUSCULAR

## 2022-04-07 MED ORDER — COSYNTROPIN 0.25 MG IJ SOLR
0.2500 mg | Freq: Once | INTRAMUSCULAR | Status: DC
  Filled 2022-04-07: qty 0.25

## 2022-04-07 NOTE — Progress Notes (Signed)
ACTH Stimulation testing completed. Patient tolerated without difficulty.        

## 2022-04-08 ENCOUNTER — Telehealth: Payer: Self-pay | Admitting: Cardiovascular Disease

## 2022-04-08 ENCOUNTER — Other Ambulatory Visit: Payer: Self-pay

## 2022-04-08 LAB — ACTH STIMULATION, 3 TIME POINTS
Cortisol, 30 Min: 4.5 ug/dL
Cortisol, 60 Min: 5.9 ug/dL
Cortisol, Base: <0.4 ug/dL
Cortisol, Base: <0.4 ug/dL

## 2022-04-08 MED ORDER — EZETIMIBE 10 MG PO TABS: 10.0000 mg | ORAL_TABLET | Freq: Every day | ORAL | 1 refills | Status: DC

## 2022-04-08 NOTE — Telephone Encounter (Signed)
*  STAT* If patient is at the pharmacy, call can be transferred to refill team.   1. Which medications need to be refilled? (please list name of each medication and dose if known) new prescription for Ezetimibe- changing pharmacy  2. Which pharmacy/location (including street and city if local pharmacy) is medication to be sent to?[place], [place] ,[place]  3. Do they need a 30 day or 90 day supply? 90 days and refills

## 2022-04-09 ENCOUNTER — Encounter: Payer: Self-pay | Admitting: "Endocrinology

## 2022-04-09 ENCOUNTER — Ambulatory Visit (INDEPENDENT_AMBULATORY_CARE_PROVIDER_SITE_OTHER): Payer: Medicare Other | Admitting: "Endocrinology

## 2022-04-09 VITALS — BP 94/60 | HR 60 | Ht 63.0 in | Wt 172.8 lb

## 2022-04-09 DIAGNOSIS — I255 Ischemic cardiomyopathy: Secondary | ICD-10-CM

## 2022-04-09 DIAGNOSIS — E2740 Unspecified adrenocortical insufficiency: Secondary | ICD-10-CM

## 2022-04-09 MED ORDER — HYDROCORTISONE 10 MG PO TABS: ORAL_TABLET | ORAL | 1 refills | Status: DC

## 2022-04-09 MED ORDER — FLUDROCORTISONE ACETATE 0.1 MG PO TABS: 0.1000 mg | ORAL_TABLET | Freq: Every day | ORAL | 1 refills | Status: DC

## 2022-04-09 NOTE — Progress Notes (Unsigned)
Endocrinology Consult Note                                            04/09/2022, 12:49 PM   Subjective:    Patient ID: [name], male    DOB: 09/22/1950, PCP Venia Carbon, MD   Past Medical History:  Diagnosis Date   AAA (abdominal aortic aneurysm) ([place])    a. Duplex 01/2012: stable infrarenal saccular AAA at 3.3cm x 3.2xm (f/u recommended 01/2013 per Dr. Rockey Situ)   Alcohol abuse    CAD (coronary artery disease)    a. 1995 s/p CABG x 4: LIMA->LAD, VG->RI (known to be occluded), VG->AM->PDA;  b. 05/2002 Inf STEMI: VG->AM->PDA 100% treated w/ 2 BMS complicated by acute thrombosis req 3 BMS;  c. 07/2003 DES to  LAD & LCX (VG's to PDA & RI 100%);  d. 12/2010 Acute MI ([place]): DES to LCX & LM , LIMA ok,;  e.12/2011 Cath: LM/LCX stents ok , LIMA patent.   Cardiomyopathy ([place])    a. EF 35% by cath 2015.   Diverticulitis    1/06 Diverticulitis--CT of pelvis--diffuse sigmoid divertic   Dyspnea    GERD (gastroesophageal reflux disease)    Hyperlipidemia    Hypertension    Myocardial infarction [facility])    PAD (peripheral artery disease) ([place])    a. external iliac and mesenteric stenosis noted by noninvasive imaging.   Renal artery stenosis ([place])    a. 03/2011 PTA and stenting of L RA. b. last duplex 2016 with stable 1-59% bilateral RAS, incidental >50% R EIA stenosis   Past Surgical History:  Procedure Laterality Date   [place]     8/09  Cath--vein graft occlusions which are old--no acute changes   CARDIAC CATHETERIZATION  11/13/2010   stent x 2 @ [place]   CARDIAC CATHETERIZATION  04/05/2014   stent placement    CARDIAC CATHETERIZATION  10/28/2017   CLOSED REDUCTION SHOULDER DISLOCATION     COLONOSCOPY WITH PROPOFOL N/A 12/22/2018   Procedure: COLONOSCOPY WITH PROPOFOL;  Surgeon: Jonathon Bellows, MD;  Location[facility] ENDOSCOPY;  Service: Gastroenterology;  Laterality: N/A;   CORONARY ANGIOPLASTY  04/05/2014   stent placement OM 1    CORONARY ARTERY BYPASS GRAFT     CORONARY STENT PLACEMENT  7/12   2 stents--Promus element plus (everolimus eluting)--[facility] in [place] WIRE/FFR STUDY N/A 05/13/2021   Procedure: INTRAVASCULAR PRESSURE WIRE/FFR STUDY;  Surgeon: Early Osmond, MD;  Location: [place] CV LAB;  Service: Cardiovascular;  Laterality: N/A;   LEFT HEART CATH AND CORONARY ANGIOGRAPHY N/A 05/13/2021   Procedure: LEFT HEART CATH AND CORONARY ANGIOGRAPHY;  Surgeon: Early Osmond, MD;  Location: [place] CV LAB;  Service: Cardiovascular;  Laterality: N/A;   LEFT HEART CATH AND CORS/GRAFTS ANGIOGRAPHY N/A 10/28/2017   Procedure: LEFT HEART CATH AND CORS/GRAFTS ANGIOGRAPHY;  Surgeon: Leonie Man, MD;  Location: [place] CV LAB;  Service: Cardiovascular;  Laterality: N/A;   LEFT HEART CATHETERIZATION WITH CORONARY/GRAFT ANGIOGRAM N/A 01/04/2012   Procedure: LEFT HEART CATHETERIZATION WITH Beatrix Fetters;  Surgeon: Burnell Blanks, MD;  Location: [facility] CATH LAB;  Service: Cardiovascular;  Laterality: N/A;   LEFT HEART CATHETERIZATION WITH CORONARY/GRAFT ANGIOGRAM N/A 04/05/2014   Procedure: LEFT HEART CATHETERIZATION WITH CORONARY/GRAFT ANGIOGRAM;  Surgeon: Jettie Booze,  MD;  Location: [place] CATH LAB;  Service: Cardiovascular;  Laterality: N/A;   PERCUTANEOUS CORONARY STENT INTERVENTION (PCI-S)  04/05/2014   Procedure: PERCUTANEOUS CORONARY STENT INTERVENTION (PCI-S);  Surgeon: Jettie Booze, MD;  Location: [facility] CATH LAB;  Service: Cardiovascular;;  OM1   RENAL ANGIOGRAM N/A 04/16/2011   Procedure: RENAL ANGIOGRAM;  Surgeon: Burnell Blanks, MD;  Location: [facility] CATH LAB;  Service: Cardiovascular;  Laterality: N/A;   RENAL ARTERY STENT  03/2011 ?   Social History   Socioeconomic History   Marital status: Married    Spouse name: Not on file   Number of children: 3   Years of education: Not on file   Highest education level: Not on file   Occupational History   Occupation: Geneticist, molecular (Animal nutritionist)    Comment: Retired  Tobacco Use   Smoking status: Former    Packs/day: 3.00    Years: 25.00    Total pack years: 75.00    Types: Cigarettes    Quit date: 05/28/1993    Years since quitting: 28.8    Passive exposure: Past   Smokeless tobacco: Never  Vaping Use   Vaping Use: Never used  Substance and Sexual Activity   Alcohol use: Yes    Alcohol/week: 42.0 standard drinks of alcohol    Types: 42 Cans of beer per week   Drug use: No   Sexual activity: Not on file  Other Topics Concern   Not on file  Social History Narrative   Working on living will   No health care POA but requests wife--then children   Would accept resuscitation   Not sure about tube feeds   Social Determinants of Health   Financial Resource Strain: Not on file  Food Insecurity: No Food Insecurity (02/04/2022)   Hunger Vital Sign    Worried About Running Out of Food in the Last Year: Never true    Ran Out of Food in the Last Year: Never true  Transportation Needs: No Transportation Needs (02/04/2022)   PRAPARE - Hydrologist (Medical): No    Lack of Transportation (Non-Medical): No  Physical Activity: Not on file  Stress: Not on file  Social Connections: Not on file   Family History  Problem Relation Age of Onset   Coronary artery disease Mother        Died MI age 17   Hypertension Mother    Heart attack Mother    Coronary artery disease Sister        Living   Coronary artery disease Brother        Living   Coronary artery disease Father        Died MI age 53   Heart attack Father    Heart attack Maternal Grandfather    Diabetes Neg Hx    Cancer Neg Hx        prostate or colon   Outpatient Encounter Medications as of 04/09/2022  Medication Sig   fludrocortisone (FLORINEF) 0.1 MG tablet Take 1 tablet (0.1 mg total) by mouth daily.   hydrocortisone (CORTEF) 10 MG tablet Take 1 tablet  daily at 8 AM, 1 tablet daily at noon   aspirin 81 MG tablet Take 81 mg by mouth daily.   carvedilol (COREG) 3.125 MG tablet Take 1 tablet (3.125 mg total) by mouth 2 (two) times daily.   clopidogrel (PLAVIX) 75 MG tablet TAKE 1 TABLET BY MOUTH EVERY DAY (Patient taking differently: Take 75 mg  by mouth daily.)   cyanocobalamin (VITAMIN B12) 1000 MCG tablet Take 1,000 mcg by mouth daily.   ezetimibe (ZETIA) 10 MG tablet Take 1 tablet (10 mg total) by mouth daily.   gabapentin (NEURONTIN) 100 MG capsule Take 1 capsule (100 mg total) by mouth 2 (two) times daily.   mirtazapine (REMERON) 15 MG tablet TAKE 1 TABLET BY MOUTH EVERYDAY AT BEDTIME (Patient taking differently: Take 15 mg by mouth at bedtime.)   nitroGLYCERIN (NITROSTAT) 0.4 MG SL tablet PLACE 1 TABLET UNDER THE TONGUE EVERY 5 MINUTES AS NEEDED FOR CHEST PAIN. (Patient taking differently: Place 0.4 mg under the tongue every 5 (five) minutes as needed for chest pain.)   pantoprazole (PROTONIX) 40 MG tablet Take 1 tablet (40 mg total) by mouth 2 (two) times daily.   ranolazine (RANEXA) 500 MG 12 hr tablet Take 1 tablet (500 mg total) by mouth 2 (two) times daily.   rosuvastatin (CRESTOR) 40 MG tablet TAKE 1 TABLET BY MOUTH EVERY DAY (Patient taking differently: Take 40 mg by mouth daily.)   spironolactone (ALDACTONE) 25 MG tablet Take 0.5 tablets (12.5 mg total) by mouth daily.   [DISCONTINUED] dexamethasone (DECADRON) 1 MG tablet Take 1 tablet (1 mg total) by mouth daily with breakfast.   No facility-administered encounter medications on file as of 04/09/2022.   ALLERGIES: Allergies  Allergen Reactions   Metoprolol Tartrate Hives and Itching   Quinolones Other (See Comments)    Contraindicated with AAA   Metoclopramide Nausea Only    Other reaction(s): Unknown    VACCINATION STATUS: Immunization History  Administered Date(s) Administered   COVID-19, mRNA, vaccine(Comirnaty)12 years and older 02/11/2022   Fluad Quad(high Dose 65+)  02/16/2019, 02/11/2022   Influenza,inj,Quad PF,6+ Mos 12/19/2013, 12/31/2014, 01/05/2016, 01/07/2017, 02/09/2018, 02/19/2020   PFIZER Comirnaty(Gray Top)Covid-19 Tri-Sucrose Vaccine 10/06/2020   PFIZER(Purple Top)SARS-COV-2 Vaccination 06/16/2019, 07/10/2019   Pfizer Covid-19 Vaccine Bivalent Booster 31yr & up 02/23/2021   Pneumococcal Conjugate-13 01/05/2016   Pneumococcal Polysaccharide-23 12/19/2013, 02/19/2020   Td 12/26/2001   Tdap 12/13/2012    HPI BFREDRICK GEOGHEGANis 71y.o. male who presents today with a medical history as above. he is being seen in consultation for insufficiency requested by LVenia Carbon MD.  He is accompanied by his wife to clinic. His medical record showed that on January 25, 2022 he visited emergency room for dizziness where he was found to have hypotension and dehydration.  Subsequent inpatient workup showed hypocortisolemia which required steroid replacement.  He was treated with dexamethasone for few days before he was switched to hydrocortisone 10 mg p.o. twice daily.  He has no acute symptoms now, reports stabilization of his blood pressure and no longer has dizziness or lightheadedness.  He denies any prior exposure to heavy dose steroids.  He denies any abdominal injury to the adrenals.  He did not have any prior knowledge of adrenal, thyroid, pituitary dysfunction.  He is a patient with coronary artery disease which required quadruple bypass in 1995.  He still has hypertension on medication management.  His medications currently include carvedilol 3.125 mg p.o. twice daily, spironolactone 12.5 mg p.o. daily. He also has hyperlipidemia currently managed by Crestor 40 mg p.o. nightly.  Records show A1c of 6.8%, patient not on antidiabetic medications.  He is aware of diabetes diagnosis.   Review of Systems  Constitutional:  + Minimally fluctuating body weight,  no fatigue, no subjective hyperthermia, no subjective hypothermia Eyes: no blurry vision, no  xerophthalmia ENT: no sore throat, no  nodules palpated in throat, no dysphagia/odynophagia, no hoarseness Cardiovascular: no Chest Pain, no Shortness of Breath, no palpitations, no leg swelling Respiratory: no cough, no shortness of breath Gastrointestinal: no Nausea/Vomiting/Diarhhea Musculoskeletal: no muscle/joint aches Skin: no rashes Neurological: no tremors, no numbness, no tingling, no dizziness Psychiatric: no depression, no anxiety  Objective:       04/09/2022   10:24 AM 04/07/2022   10:45 AM 03/25/2022    1:07 PM  Vitals with BMI  Height '5\' 3"'$   '5\' 3"'$   Weight 172 lbs 13 oz  167 lbs 6 oz  BMI 42.87  68.11  Systolic 94 572 620  Diastolic 60 80 72  Pulse 60 54 56    BP 94/60   Pulse 60   Ht '5\' 3"'$  (1.6 m)   Wt 172 lb 12.8 oz (78.4 kg)   BMI 30.61 kg/m   Wt Readings from Last 3 Encounters:  04/09/22 172 lb 12.8 oz (78.4 kg)  03/25/22 167 lb 6.4 oz (75.9 kg)  03/08/22 169 lb 6.4 oz (76.8 kg)    Physical Exam  Constitutional:  Body mass index is 30.61 kg/m.,  not in acute distress, normal state of mind Eyes: PERRLA, EOMI, no exophthalmos ENT: moist mucous membranes, no gross thyromegaly, no gross cervical lymphadenopathy Cardiovascular: normal precordial activity, Regular Rate and Rhythm, no Murmur/Rubs/Gallops Respiratory:  adequate breathing efforts, no gross chest deformity, Clear to auscultation bilaterally Gastrointestinal: abdomen soft, Non -tender, No distension, Bowel Sounds present, no gross organomegaly Musculoskeletal: no gross deformities, strength intact in all four extremities Skin: moist, warm, no rashes, + tanned skin Neurological: no tremor with outstretched hands, Deep tendon reflexes normal in bilateral lower extremities.  CMP ( most recent) CMP     Component Value Date/Time   NA 136 03/26/2022 1042   NA 132 (L) 06/04/2021 1110   K 4.1 03/26/2022 1042   CL 104 03/26/2022 1042   CO2 24 03/26/2022 1042   GLUCOSE 118 (H) 03/26/2022 1042    BUN 9 03/26/2022 1042   BUN 6 (L) 06/04/2021 1110   CREATININE 0.90 03/26/2022 1042   CREATININE 0.84 01/07/2017 1120   CALCIUM 9.3 03/26/2022 1042   PROT 6.4 (L) 01/24/2022 2210   PROT 7.2 08/11/2015 0941   ALBUMIN 3.5 01/24/2022 2210   ALBUMIN 4.5 08/11/2015 0941   AST 18 01/24/2022 2210   ALT 12 01/24/2022 2210   ALKPHOS 36 (L) 01/24/2022 2210   BILITOT 0.7 01/24/2022 2210   BILITOT 0.6 08/11/2015 0941   GFRNONAA >60 03/26/2022 1042   GFRAA >60 01/18/2020 1408     Diabetic Labs (most recent): Lab Results  Component Value Date   HGBA1C 6.8 (H) 05/14/2021   HGBA1C 5.8 (H) 04/04/2014     Lipid Panel ( most recent) Lipid Panel     Component Value Date/Time   CHOL 136 12/15/2021 0819   CHOL 163 08/11/2015 0941   TRIG 172.0 (H) 12/15/2021 0819   HDL 52.20 12/15/2021 0819   HDL 46 08/11/2015 0941   CHOLHDL 3 12/15/2021 0819   VLDL 34.4 12/15/2021 0819   LDLCALC 50 12/15/2021 0819   LDLCALC 63 01/07/2017 1120   LDLDIRECT 65.0 02/20/2021 1027   LABVLDL 45 (H) 08/11/2015 0941      Lab Results  Component Value Date   TSH 1.342 01/25/2022   TSH 2.73 12/15/2021   TSH 2.086 10/28/2017   TSH 4.010 04/04/2014   TSH 0.81 12/30/2010   TSH 1.58 09/02/2010   FREET4 0.83 01/05/2016   FREET4 0.90  12/31/2014   FREET4 1.01 04/04/2014           Assessment & Plan:   1. Adrenal insufficiency ([place])   - [name]  is being seen at a kind request of Venia Carbon, MD. - I have reviewed his available endocrine records and clinically evaluated the patient. 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  He still has 2 medications to manage hypertension and is no evidence of mineralocorticoid deficiency.  He also has type 2 diabetes not on treatment, hyperlipidemia on treatment. His workup for adrenal insufficiency is incomplete.  To facilitate workup, I discussed and switched him back to dexamethasone 1  mg p.o. daily for 10 days followed by ACTH stimulation test and office visit in 15 days.  If he is determined to have adrenal insufficiency, he will be switched back to hydrocortisone twice a day to manage for life.  He will be approached with medical alert bracelet or necklace to wear for emergency. His A1c needs follow-up as well as his lipid profile.  In light of his history of quadruple coronary artery bypass, his risk factors need to be all controlled.   - he is advised to maintain close follow up with Venia Carbon, MD for primary care needs.   - Time spent with the patient: 50 minutes, of which >50% was spent in  counseling him about his adrenal insufficiency, hypertension, hyperlipidemia, type 2 diabetes and the rest in obtaining information about his symptoms, reviewing his previous labs/studies ( including abstractions from other facilities),  evaluations, and treatments,  and developing a plan to confirm diagnosis and long term treatment based on the latest standards of care/guidelines; and documenting his care.  [name] participated in the discussions, expressed understanding, and voiced agreement with the above plans.  All questions were answered to his satisfaction. he is encouraged to contact clinic should he have any questions or concerns prior to his return visit.  Follow up plan: Return in about 6 months (around 10/09/2022) for F/U with Pre-visit Labs.   Glade Lloyd, MD [facility] [address] [place], [place] [address] Phone: [phone]  Fax: [phone]     04/09/2022, 12:49 PM  This note was partially dictated with voice recognition software. Similar sounding words can be transcribed inadequately or may not  be corrected upon review.

## 2022-04-14 ENCOUNTER — Encounter: Payer: Self-pay | Admitting: Cardiovascular Disease

## 2022-04-15 ENCOUNTER — Encounter: Payer: Self-pay | Admitting: Internal Medicine

## 2022-04-20 ENCOUNTER — Emergency Department (HOSPITAL_COMMUNITY): Payer: Medicare Other

## 2022-04-20 ENCOUNTER — Telehealth: Payer: Self-pay | Admitting: Internal Medicine

## 2022-04-20 ENCOUNTER — Encounter (HOSPITAL_COMMUNITY): Payer: Self-pay | Admitting: Emergency Medicine

## 2022-04-20 ENCOUNTER — Inpatient Hospital Stay (HOSPITAL_COMMUNITY)
Admission: EM | Admit: 2022-04-20 | Discharge: 2022-04-22 | DRG: 280 | Disposition: A | Discharge status: Home / Self Care | Payer: Medicare Other | Attending: Internal Medicine | Admitting: Internal Medicine

## 2022-04-20 DIAGNOSIS — E785 Hyperlipidemia, unspecified: Secondary | ICD-10-CM | POA: Diagnosis present

## 2022-04-20 DIAGNOSIS — Z79899 Other long term (current) drug therapy: Secondary | ICD-10-CM | POA: Diagnosis not present

## 2022-04-20 DIAGNOSIS — I7143 Infrarenal abdominal aortic aneurysm, without rupture: Secondary | ICD-10-CM | POA: Diagnosis present

## 2022-04-20 DIAGNOSIS — I110 Hypertensive heart disease with heart failure: Principal | ICD-10-CM | POA: Diagnosis present

## 2022-04-20 DIAGNOSIS — I5043 Acute on chronic combined systolic (congestive) and diastolic (congestive) heart failure: Secondary | ICD-10-CM | POA: Diagnosis present

## 2022-04-20 DIAGNOSIS — I5042 Chronic combined systolic (congestive) and diastolic (congestive) heart failure: Secondary | ICD-10-CM | POA: Diagnosis present

## 2022-04-20 DIAGNOSIS — Z8249 Family history of ischemic heart disease and other diseases of the circulatory system: Secondary | ICD-10-CM | POA: Diagnosis not present

## 2022-04-20 DIAGNOSIS — R079 Chest pain, unspecified: Secondary | ICD-10-CM | POA: Diagnosis not present

## 2022-04-20 DIAGNOSIS — I252 Old myocardial infarction: Secondary | ICD-10-CM | POA: Diagnosis not present

## 2022-04-20 DIAGNOSIS — Z87891 Personal history of nicotine dependence: Secondary | ICD-10-CM | POA: Diagnosis not present

## 2022-04-20 DIAGNOSIS — I5021 Acute systolic (congestive) heart failure: Secondary | ICD-10-CM | POA: Diagnosis not present

## 2022-04-20 DIAGNOSIS — R0602 Shortness of breath: Secondary | ICD-10-CM | POA: Diagnosis not present

## 2022-04-20 DIAGNOSIS — I959 Hypotension, unspecified: Secondary | ICD-10-CM | POA: Diagnosis not present

## 2022-04-20 DIAGNOSIS — E1151 Type 2 diabetes mellitus with diabetic peripheral angiopathy without gangrene: Secondary | ICD-10-CM | POA: Diagnosis present

## 2022-04-20 DIAGNOSIS — I493 Ventricular premature depolarization: Secondary | ICD-10-CM | POA: Diagnosis not present

## 2022-04-20 DIAGNOSIS — I255 Ischemic cardiomyopathy: Secondary | ICD-10-CM | POA: Diagnosis present

## 2022-04-20 DIAGNOSIS — E871 Hypo-osmolality and hyponatremia: Secondary | ICD-10-CM | POA: Diagnosis present

## 2022-04-20 DIAGNOSIS — I2489 Other forms of acute ischemic heart disease: Secondary | ICD-10-CM | POA: Diagnosis not present

## 2022-04-20 DIAGNOSIS — I10 Essential (primary) hypertension: Secondary | ICD-10-CM | POA: Diagnosis present

## 2022-04-20 DIAGNOSIS — I472 Ventricular tachycardia, unspecified: Secondary | ICD-10-CM | POA: Diagnosis not present

## 2022-04-20 DIAGNOSIS — Z7982 Long term (current) use of aspirin: Secondary | ICD-10-CM

## 2022-04-20 DIAGNOSIS — I509 Heart failure, unspecified: Secondary | ICD-10-CM

## 2022-04-20 DIAGNOSIS — I2510 Atherosclerotic heart disease of native coronary artery without angina pectoris: Secondary | ICD-10-CM | POA: Diagnosis present

## 2022-04-20 DIAGNOSIS — I714 Abdominal aortic aneurysm, without rupture, unspecified: Secondary | ICD-10-CM | POA: Diagnosis present

## 2022-04-20 DIAGNOSIS — Z951 Presence of aortocoronary bypass graft: Secondary | ICD-10-CM | POA: Diagnosis not present

## 2022-04-20 DIAGNOSIS — Z955 Presence of coronary angioplasty implant and graft: Secondary | ICD-10-CM | POA: Diagnosis not present

## 2022-04-20 DIAGNOSIS — I21A1 Myocardial infarction type 2: Secondary | ICD-10-CM | POA: Diagnosis present

## 2022-04-20 DIAGNOSIS — Z1152 Encounter for screening for COVID-19: Secondary | ICD-10-CM

## 2022-04-20 DIAGNOSIS — I5023 Acute on chronic systolic (congestive) heart failure: Secondary | ICD-10-CM

## 2022-04-20 DIAGNOSIS — E271 Primary adrenocortical insufficiency: Secondary | ICD-10-CM | POA: Insufficient documentation

## 2022-04-20 DIAGNOSIS — Z7902 Long term (current) use of antithrombotics/antiplatelets: Secondary | ICD-10-CM

## 2022-04-20 DIAGNOSIS — E2740 Unspecified adrenocortical insufficiency: Secondary | ICD-10-CM | POA: Diagnosis present

## 2022-04-20 DIAGNOSIS — I214 Non-ST elevation (NSTEMI) myocardial infarction: Secondary | ICD-10-CM | POA: Diagnosis not present

## 2022-04-20 DIAGNOSIS — K219 Gastro-esophageal reflux disease without esophagitis: Secondary | ICD-10-CM | POA: Diagnosis present

## 2022-04-20 DIAGNOSIS — I5022 Chronic systolic (congestive) heart failure: Secondary | ICD-10-CM

## 2022-04-20 LAB — CBC WITH DIFFERENTIAL/PLATELET
Abs Immature Granulocytes: 0.03 10*3/uL (ref 0.00–0.07)
Basophils Absolute: 0.1 10*3/uL (ref 0.0–0.1)
Basophils Relative: 1 %
Eosinophils Absolute: 0.6 10*3/uL — ABNORMAL HIGH (ref 0.0–0.5)
Eosinophils Relative: 7 %
HCT: 34.1 % — ABNORMAL LOW (ref 39.0–52.0)
Hemoglobin: 10.6 g/dL — ABNORMAL LOW (ref 13.0–17.0)
Immature Granulocytes: 0 %
Lymphocytes Relative: 21 %
Lymphs Abs: 1.6 10*3/uL (ref 0.7–4.0)
MCH: 25 pg — ABNORMAL LOW (ref 26.0–34.0)
MCHC: 31.1 g/dL (ref 30.0–36.0)
MCV: 80.4 fL (ref 80.0–100.0)
Monocytes Absolute: 0.8 10*3/uL (ref 0.1–1.0)
Monocytes Relative: 10 %
Neutro Abs: 4.9 10*3/uL (ref 1.7–7.7)
Neutrophils Relative %: 61 %
Platelets: 336 10*3/uL (ref 150–400)
RBC: 4.24 MIL/uL (ref 4.22–5.81)
RDW: 19.6 % — ABNORMAL HIGH (ref 11.5–15.5)
WBC: 8 10*3/uL (ref 4.0–10.5)
nRBC: 0 % (ref 0.0–0.2)

## 2022-04-20 LAB — BASIC METABOLIC PANEL
Anion gap: 7 (ref 5–15)
BUN: 10 mg/dL (ref 8–23)
CO2: 25 mmol/L (ref 22–32)
Calcium: 8.8 mg/dL — ABNORMAL LOW (ref 8.9–10.3)
Chloride: 101 mmol/L (ref 98–111)
Creatinine, Ser: 0.88 mg/dL (ref 0.61–1.24)
GFR, Estimated: >60 mL/min (ref >60)
Glucose, Bld: 95 mg/dL (ref 70–99)
Potassium: 3.6 mmol/L (ref 3.5–5.1)
Sodium: 133 mmol/L — ABNORMAL LOW (ref 135–145)

## 2022-04-20 LAB — BRAIN NATRIURETIC PEPTIDE: B Natriuretic Peptide: 789.5 pg/mL — ABNORMAL HIGH (ref 0.0–100.0)

## 2022-04-20 LAB — RESP PANEL BY RT-PCR (RSV, FLU A&B, COVID)  RVPGX2
Influenza A by PCR: NEGATIVE
Influenza B by PCR: NEGATIVE
Resp Syncytial Virus by PCR: NEGATIVE
SARS Coronavirus 2 by RT PCR: NEGATIVE

## 2022-04-20 LAB — TROPONIN I (HIGH SENSITIVITY)
Troponin I (High Sensitivity): 831 ng/L (ref <18)
Troponin I (High Sensitivity): 723 ng/L (ref <18)

## 2022-04-20 MED ORDER — VITAMIN B-12 1000 MCG PO TABS
1000.0000 ug | ORAL_TABLET | Freq: Every day | ORAL | Status: DC
  Administered 2022-04-21 – 2022-04-22 (×2): 1000 ug via ORAL
  Filled 2022-04-20 (×2): qty 1

## 2022-04-20 MED ORDER — RANOLAZINE ER 500 MG PO TB12
500.0000 mg | ORAL_TABLET | Freq: Two times a day (BID) | ORAL | Status: DC
  Administered 2022-04-20 – 2022-04-22 (×4): 500 mg via ORAL
  Filled 2022-04-20 (×4): qty 1

## 2022-04-20 MED ORDER — POTASSIUM CHLORIDE CRYS ER 20 MEQ PO TBCR
40.0000 meq | EXTENDED_RELEASE_TABLET | Freq: Once | ORAL | Status: AC
  Administered 2022-04-20: 40 meq via ORAL
  Filled 2022-04-20: qty 2

## 2022-04-20 MED ORDER — FUROSEMIDE 10 MG/ML IJ SOLN
40.0000 mg | Freq: Once | INTRAMUSCULAR | Status: AC
  Administered 2022-04-20: 40 mg via INTRAVENOUS
  Filled 2022-04-20: qty 4

## 2022-04-20 MED ORDER — FLUDROCORTISONE ACETATE 0.1 MG PO TABS
0.1000 mg | ORAL_TABLET | Freq: Every day | ORAL | Status: DC
  Administered 2022-04-21 – 2022-04-22 (×2): 0.1 mg via ORAL
  Filled 2022-04-20 (×2): qty 1

## 2022-04-20 MED ORDER — ENOXAPARIN SODIUM 40 MG/0.4ML IJ SOSY
40.0000 mg | PREFILLED_SYRINGE | INTRAMUSCULAR | Status: DC
  Administered 2022-04-20 – 2022-04-21 (×2): 40 mg via SUBCUTANEOUS
  Filled 2022-04-20 (×3): qty 0.4

## 2022-04-20 MED ORDER — ACETAMINOPHEN 325 MG PO TABS: 650.0000 mg | ORAL_TABLET | ORAL | Status: DC | PRN

## 2022-04-20 MED ORDER — ASPIRIN 81 MG PO TBEC
81.0000 mg | DELAYED_RELEASE_TABLET | Freq: Every day | ORAL | Status: DC
  Administered 2022-04-21 – 2022-04-22 (×2): 81 mg via ORAL
  Filled 2022-04-20 (×3): qty 1

## 2022-04-20 MED ORDER — SODIUM CHLORIDE 0.9% FLUSH: 3.0000 mL | INTRAVENOUS | Status: DC | PRN

## 2022-04-20 MED ORDER — FUROSEMIDE 10 MG/ML IJ SOLN: 20.0000 mg | Freq: Once | INTRAMUSCULAR | Status: DC

## 2022-04-20 MED ORDER — MORPHINE SULFATE (PF) 2 MG/ML IV SOLN
2.0000 mg | Freq: Once | INTRAVENOUS | Status: AC
  Administered 2022-04-20: 2 mg via INTRAVENOUS
  Filled 2022-04-20: qty 1

## 2022-04-20 MED ORDER — PANTOPRAZOLE SODIUM 40 MG PO TBEC
40.0000 mg | DELAYED_RELEASE_TABLET | Freq: Two times a day (BID) | ORAL | Status: DC
  Administered 2022-04-21 – 2022-04-22 (×4): 40 mg via ORAL
  Filled 2022-04-20 (×4): qty 1

## 2022-04-20 MED ORDER — SODIUM CHLORIDE 0.9% FLUSH
3.0000 mL | Freq: Two times a day (BID) | INTRAVENOUS | Status: DC
  Administered 2022-04-20 – 2022-04-21 (×2): 3 mL via INTRAVENOUS

## 2022-04-20 MED ORDER — HYDROCORTISONE 10 MG PO TABS
10.0000 mg | ORAL_TABLET | Freq: Every day | ORAL | Status: DC
  Filled 2022-04-20: qty 1

## 2022-04-20 MED ORDER — SPIRONOLACTONE 12.5 MG HALF TABLET
12.5000 mg | ORAL_TABLET | Freq: Every day | ORAL | Status: DC
  Administered 2022-04-21: 12.5 mg via ORAL
  Filled 2022-04-20: qty 1

## 2022-04-20 MED ORDER — CARVEDILOL 3.125 MG PO TABS
6.2500 mg | ORAL_TABLET | Freq: Two times a day (BID) | ORAL | Status: DC
  Administered 2022-04-20: 6.25 mg via ORAL
  Filled 2022-04-20 (×3): qty 2

## 2022-04-20 MED ORDER — GABAPENTIN 100 MG PO CAPS
100.0000 mg | ORAL_CAPSULE | Freq: Two times a day (BID) | ORAL | Status: DC
  Administered 2022-04-20 – 2022-04-22 (×4): 100 mg via ORAL
  Filled 2022-04-20 (×3): qty 1

## 2022-04-20 MED ORDER — CARVEDILOL 3.125 MG PO TABS: 3.1250 mg | ORAL_TABLET | Freq: Two times a day (BID) | ORAL | Status: DC

## 2022-04-20 MED ORDER — EZETIMIBE 10 MG PO TABS
10.0000 mg | ORAL_TABLET | Freq: Every day | ORAL | Status: DC
  Administered 2022-04-21 – 2022-04-22 (×2): 10 mg via ORAL
  Filled 2022-04-20 (×2): qty 1

## 2022-04-20 MED ORDER — ONDANSETRON HCL 4 MG/2ML IJ SOLN: 4.0000 mg | Freq: Four times a day (QID) | INTRAMUSCULAR | Status: DC | PRN

## 2022-04-20 MED ORDER — ROSUVASTATIN CALCIUM 20 MG PO TABS
40.0000 mg | ORAL_TABLET | Freq: Every day | ORAL | Status: DC
  Administered 2022-04-21 – 2022-04-22 (×2): 40 mg via ORAL
  Filled 2022-04-20 (×2): qty 2

## 2022-04-20 MED ORDER — CLOPIDOGREL BISULFATE 75 MG PO TABS
75.0000 mg | ORAL_TABLET | Freq: Every day | ORAL | Status: DC
  Administered 2022-04-21 – 2022-04-22 (×2): 75 mg via ORAL
  Filled 2022-04-20 (×2): qty 1

## 2022-04-20 MED ORDER — SODIUM CHLORIDE 0.9 % IV SOLN: 250.0000 mL | INTRAVENOUS | Status: DC | PRN

## 2022-04-20 MED ORDER — MIRTAZAPINE 15 MG PO TABS
15.0000 mg | ORAL_TABLET | Freq: Every day | ORAL | Status: DC
  Administered 2022-04-20 – 2022-04-21 (×2): 15 mg via ORAL
  Filled 2022-04-20 (×3): qty 1

## 2022-04-20 NOTE — ED Notes (Signed)
Pt repositioned. States he is going to try to rest. Lights turned down.

## 2022-04-20 NOTE — Telephone Encounter (Signed)
In the ER now CXR benign Will await his full evaluation and then decide on need for follow up

## 2022-04-20 NOTE — Telephone Encounter (Signed)
Per chart review tab pt is presently at [facility] ED. Sending note to Dr Silvio Pate and Silvio Pate pool.

## 2022-04-20 NOTE — ED Provider Notes (Signed)
[facility] EMERGENCY DEPARTMENT Provider Note   CSN: [id number] Arrival date & time: 04/20/22  [id number]     History  Chief Complaint  Patient presents with   Shortness of Breath    [name] is a 71 y.o. male.   Shortness of Breath Patient presents with shortness of breath.  Has had for around a week now.  Does have some chronic shortness of breath due to chronic coronary artery disease.  Over the last week has been more short of breath.  Decreased activity.  Not really having tightness but states difficulty laying flat.  States does have some swelling on his legs but not that much more than normal.  States he does feel as if he is carrying more fluid however.    Past Medical History:  Diagnosis Date   AAA (abdominal aortic aneurysm) ([place])    a. Duplex 01/2012: stable infrarenal saccular AAA at 3.3cm x 3.2xm (f/u recommended 01/2013 per Dr. Rockey Situ)   Alcohol abuse    CAD (coronary artery disease)    a. 1995 s/p CABG x 4: LIMA->LAD, VG->RI (known to be occluded), VG->AM->PDA;  b. 05/2002 Inf STEMI: VG->AM->PDA 100% treated w/ 2 BMS complicated by acute thrombosis req 3 BMS;  c. 07/2003 DES to  LAD & LCX (VG's to PDA & RI 100%);  d. 12/2010 Acute [place] ([place]): DES to LCX & LM , LIMA ok,;  e.12/2011 Cath: LM/LCX stents ok , LIMA patent.   Cardiomyopathy ([place])    a. EF 35% by cath 2015.   Diverticulitis    1/06 Diverticulitis--CT of pelvis--diffuse sigmoid divertic   Dyspnea    GERD (gastroesophageal reflux disease)    Hyperlipidemia    Hypertension    Myocardial infarction [facility])    PAD (peripheral artery disease) ([place])    a. external iliac and mesenteric stenosis noted by noninvasive imaging.   Renal artery stenosis ([place])    a. 03/2011 PTA and stenting of L RA. b. last duplex 2016 with stable 1-59% bilateral RAS, incidental >50% R EIA stenosis    Home Medications Prior to Admission medications   Medication Sig Start Date End Date Taking? Authorizing Provider   aspirin 81 MG tablet Take 81 mg by mouth daily.    [provider]  carvedilol (COREG) 3.125 MG tablet Take 1 tablet (3.125 mg total) by mouth 2 (two) times daily. 02/12/22   Furth, Cadence H, PA-C  clopidogrel (PLAVIX) 75 MG tablet TAKE 1 TABLET BY MOUTH EVERY DAY Patient taking differently: Take 75 mg by mouth daily. 09/22/21   Minna Merritts, MD  cyanocobalamin (VITAMIN B12) 1000 MCG tablet Take 1,000 mcg by mouth daily.    [provider]  ezetimibe (ZETIA) 10 MG tablet Take 1 tablet (10 mg total) by mouth daily. 04/08/22   Minna Merritts, MD  fludrocortisone (FLORINEF) 0.1 MG tablet Take 1 tablet (0.1 mg total) by mouth daily. 04/09/22   Cassandria Anger, MD  gabapentin (NEURONTIN) 100 MG capsule Take 1 capsule (100 mg total) by mouth 2 (two) times daily. 09/01/21   Venia Carbon, MD  hydrocortisone (CORTEF) 10 MG tablet Take 1 tablet daily at 8 AM, 1 tablet daily at noon 04/09/22   Cassandria Anger, MD  mirtazapine (REMERON) 15 MG tablet TAKE 1 TABLET BY MOUTH EVERYDAY AT BEDTIME Patient taking differently: Take 15 mg by mouth at bedtime. 04/06/21   Venia Carbon, MD  nitroGLYCERIN (NITROSTAT) 0.4 MG SL tablet PLACE 1 TABLET UNDER  THE TONGUE EVERY 5 MINUTES AS NEEDED FOR CHEST PAIN. Patient taking differently: Place 0.4 mg under the tongue every 5 (five) minutes as needed for chest pain. 08/13/21   Venia Carbon, MD  pantoprazole (PROTONIX) 40 MG tablet Take 1 tablet (40 mg total) by mouth 2 (two) times daily. 06/04/21   Venia Carbon, MD  ranolazine (RANEXA) 500 MG 12 hr tablet Take 1 tablet (500 mg total) by mouth 2 (two) times daily. 01/01/22   Minna Merritts, MD  rosuvastatin (CRESTOR) 40 MG tablet TAKE 1 TABLET BY MOUTH EVERY DAY Patient taking differently: Take 40 mg by mouth daily. 06/04/21   Minna Merritts, MD  spironolactone (ALDACTONE) 25 MG tablet Take 0.5 tablets (12.5 mg total) by mouth daily. 04/01/22 06/30/22  Furth, Cadence H, PA-C       Allergies    Metoprolol tartrate, Quinolones, and Metoclopramide    Review of Systems   Review of Systems  Respiratory:  Positive for shortness of breath.     Physical Exam Updated Vital Signs BP 136/79   Pulse 62   Temp (!) 97.3 F (36.3 C) (Oral)   Resp 19   SpO2 100%  Physical Exam Vitals and nursing note reviewed.  HENT:     Head: Atraumatic.  Cardiovascular:     Rate and Rhythm: Regular rhythm.  Pulmonary:     Breath sounds: No rales.  Chest:     Chest wall: No tenderness.  Musculoskeletal:     Comments: Mild edema bilateral lower extremities.  Neurological:     Mental Status: He is alert.     ED Results / Procedures / Treatments   Labs (all labs ordered are listed, but only abnormal results are displayed) Labs Reviewed  BASIC METABOLIC PANEL - Abnormal; Notable for the following components:      Result Value   Sodium 133 (*)    Calcium 8.8 (*)    All other components within normal limits  CBC WITH DIFFERENTIAL/PLATELET - Abnormal; Notable for the following components:   Hemoglobin 10.6 (*)    HCT 34.1 (*)    MCH 25.0 (*)    RDW 19.6 (*)    Eosinophils Absolute 0.6 (*)    All other components within normal limits  BRAIN NATRIURETIC PEPTIDE - Abnormal; Notable for the following components:   B Natriuretic Peptide 789.5 (*)    All other components within normal limits  TROPONIN I (HIGH SENSITIVITY) - Abnormal; Notable for the following components:   Troponin I (High Sensitivity) 831 (*)    All other components within normal limits  RESP PANEL BY RT-PCR (RSV, FLU A&B, COVID)  [id number]  TROPONIN I (HIGH SENSITIVITY)    EKG EKG Interpretation  Date/Time:  Tuesday April 20 2022 09:31:46 EST Ventricular Rate:  70 PR Interval:  138 QRS Duration: 96 QT Interval:  454 QTC Calculation: 490 R Axis:   79 Text Interpretation: Sinus rhythm with occasional Premature ventricular complexes Anterior infarct , age undetermined Abnormal ECG When compared  with ECG of 24-Jan-2022 21:31,  PVCs are new Confirmed by Davonna Belling [phone]) on 04/20/2022 11:59:52 AM  Radiology DG Chest 2 View  Result Date: 04/20/2022 CLINICAL DATA:  Reason for Exam: SOB. Per Triage Notes: Pt reports worsening SOB over the past few weeks. SOB worse with laying flat or exertion. Denies any CP. shortness of breath EXAM: CHEST - 2 VIEW COMPARISON:  None Available. FINDINGS: Sternotomy wires overlie normal cardiac silhouette. Cardiac stent noted. No effusion, infiltrate, or  pneumothorax. Atherosclerotic calcification of the aorta. IMPRESSION: No acute cardiopulmonary process. Aortic Atherosclerosis (ICD10-I70.0). Electronically Signed   By: Suzy Bouchard M.D.   On: 04/20/2022 10:15    Procedures Procedures    Medications Ordered in ED Medications - No data to display  ED Course/ Medical Decision Making/ A&P                           Medical Decision Making  Patient is shortness of breath.  No chest pain.  History of cardiomyopathy and coronary disease.  Worsening short of breath.  Differential diagnosis includes CHF non-STEMI cardiac ischemia pneumonia.  BNP is elevated at his troponin.  I think likely is more volume overloaded.  States his weight is up at home although cannot quantify.  Chest x-ray reassuring.  Will admit to internal medicine.  I discussed with cardiology and they think since previously not intervenable lesion will require medical management with them as a consult team.  Discussed with Dr.Acharya          Final Clinical Impression(s) / ED Diagnoses Final diagnoses:  NSTEMI (non-ST elevated myocardial infarction) ([place])  Congestive heart failure, unspecified HF chronicity, unspecified heart failure type [facility])    Rx / DC Orders ED Discharge Orders     None         Davonna Belling, MD 04/20/22 1256

## 2022-04-20 NOTE — ED Triage Notes (Signed)
Pt reports worsening SOB over the past few weeks. SOB worse with laying flat or exertion. Denies any CP.

## 2022-04-20 NOTE — Telephone Encounter (Signed)
Patient wife Manuela Schwartz called in and stated that he is having a hard time breathing. Sent over to access nurse.

## 2022-04-20 NOTE — Consult Note (Signed)
Cardiology Consultation   Patient ID: OSKER AYOUB MRN: [id number]; DOB: 1950-07-30  Admit date: 04/20/2022 Date of Consult: 04/20/2022  PCP:  Venia Carbon, MD   [place] Providers Cardiologist:  Ida Rogue, MD        Patient Profile:   [name] is a 71 y.o. male with a hx of CAD s/p CABG and PCI who is being seen 04/20/2022 for the evaluation of CHF at the request of Dr Alvino Chapel.  History of Present Illness:   [name] presents with progressive shortness of breath, leg swelling, and orthopnea.  Patient's cardiac history dates back to 1995 when he underwent three-vessel CABG and he has subsequently undergone multiple PCI procedures.  He has a history of inferior STEMI in 2004 and has developed chronic heart failure with reduced ejection fraction related to his underlying ischemic heart disease.  Comorbid conditions include peripheral arterial disease with history of left renal artery stenting, abdominal aortic aneurysm, type 2 diabetes, hypertension, remote tobacco use, and hyperlipidemia.  His most recent heart catheterization in January 2023 demonstrated continued patency of the LIMA to LAD graft, but he had known occlusion of all of his vein grafts.  The distal RCA circulation is collateralized from the LAD.  The stents in the left main into the left circumflex were shown to be patent with mild in-stent restenosis and no flow-limiting disease based on pressure wire analysis.  Ongoing medical therapy was recommended.  He underwent attempted PCI of the proximal LAD to try to improve flow into proximal septal perforators, but this was unsuccessful.  He was last seen in the office March 08, 2022, at which time he was felt to be stable.  The patient is interviewed in the emergency room.  His wife is at the bedside.  He describes about 1 to 2 weeks of progressive dyspnea with exertion, orthopnea, and PND.  He has had a really difficult time sleeping  the last few nights because of PND.  He denies any change in mild chronic leg swelling.  No abdominal swelling.  No chest pain or pressure.  No lightheadedness or syncope.  He has been short of breath with low-level activities that he can usually do with no problem.  Notes that he has been eating some more salty foods lately.   Past Medical History:  Diagnosis Date   AAA (abdominal aortic aneurysm) ([place])    a. Duplex 01/2012: stable infrarenal saccular AAA at 3.3cm x 3.2xm (f/u recommended 01/2013 per Dr. Rockey Situ)   Alcohol abuse    CAD (coronary artery disease)    a. 1995 s/p CABG x 4: LIMA->LAD, VG->RI (known to be occluded), VG->AM->PDA;  b. 05/2002 Inf STEMI: VG->AM->PDA 100% treated w/ 2 BMS complicated by acute thrombosis req 3 BMS;  c. 07/2003 DES to  LAD & LCX (VG's to PDA & RI 100%);  d. 12/2010 Acute MI ([place]): DES to LCX & LM , LIMA ok,;  e.12/2011 Cath: LM/LCX stents ok , LIMA patent.   Cardiomyopathy ([place])    a. EF 35% by cath 2015.   Diverticulitis    1/06 Diverticulitis--CT of pelvis--diffuse sigmoid divertic   Dyspnea    GERD (gastroesophageal reflux disease)    Hyperlipidemia    Hypertension    Myocardial infarction [facility])    PAD (peripheral artery disease) ([place])    a. external iliac and mesenteric stenosis noted by noninvasive imaging.   Renal artery stenosis ([place])    a. 03/2011 PTA and stenting of  L RA. b. last duplex 2016 with stable 1-59% bilateral RAS, incidental >50% R EIA stenosis    Past Surgical History:  Procedure Laterality Date   [place]     8/09  Cath--vein graft occlusions which are old--no acute changes   CARDIAC CATHETERIZATION  11/13/2010   stent x 2 @ [place]   CARDIAC CATHETERIZATION  04/05/2014   stent placement    CARDIAC CATHETERIZATION  10/28/2017   CLOSED REDUCTION SHOULDER DISLOCATION     COLONOSCOPY WITH PROPOFOL N/A 12/22/2018   Procedure: COLONOSCOPY WITH PROPOFOL;  Surgeon: Jonathon Bellows, MD;  Location: [facility]  ENDOSCOPY;  Service: Gastroenterology;  Laterality: N/A;   CORONARY ANGIOPLASTY  04/05/2014   stent placement OM 1   CORONARY ARTERY BYPASS GRAFT     CORONARY STENT PLACEMENT  7/12   2 stents--Promus element plus (everolimus eluting)--[facility] in [place] WIRE/FFR STUDY N/A 05/13/2021   Procedure: INTRAVASCULAR PRESSURE WIRE/FFR STUDY;  Surgeon: Early Osmond, MD;  Location: [place] CV LAB;  Service: Cardiovascular;  Laterality: N/A;   LEFT HEART CATH AND CORONARY ANGIOGRAPHY N/A 05/13/2021   Procedure: LEFT HEART CATH AND CORONARY ANGIOGRAPHY;  Surgeon: Early Osmond, MD;  Location: [place] CV LAB;  Service: Cardiovascular;  Laterality: N/A;   LEFT HEART CATH AND CORS/GRAFTS ANGIOGRAPHY N/A 10/28/2017   Procedure: LEFT HEART CATH AND CORS/GRAFTS ANGIOGRAPHY;  Surgeon: Leonie Man, MD;  Location: [place] CV LAB;  Service: Cardiovascular;  Laterality: N/A;   LEFT HEART CATHETERIZATION WITH CORONARY/GRAFT ANGIOGRAM N/A 01/04/2012   Procedure: LEFT HEART CATHETERIZATION WITH Beatrix Fetters;  Surgeon: Burnell Blanks, MD;  Location: [facility] CATH LAB;  Service: Cardiovascular;  Laterality: N/A;   LEFT HEART CATHETERIZATION WITH CORONARY/GRAFT ANGIOGRAM N/A 04/05/2014   Procedure: LEFT HEART CATHETERIZATION WITH Beatrix Fetters;  Surgeon: Jettie Booze, MD;  Location: [facility] CATH LAB;  Service: Cardiovascular;  Laterality: N/A;   PERCUTANEOUS CORONARY STENT INTERVENTION (PCI-S)  04/05/2014   Procedure: PERCUTANEOUS CORONARY STENT INTERVENTION (PCI-S);  Surgeon: Jettie Booze, MD;  Location: [facility] CATH LAB;  Service: Cardiovascular;;  OM1   RENAL ANGIOGRAM N/A 04/16/2011   Procedure: RENAL ANGIOGRAM;  Surgeon: Burnell Blanks, MD;  Location: [facility] CATH LAB;  Service: Cardiovascular;  Laterality: N/A;   RENAL ARTERY STENT  03/2011 ?     Home Medications:  Prior to Admission medications   Medication Sig Start Date End  Date Taking? Authorizing Provider  aspirin 81 MG tablet Take 81 mg by mouth daily.    [provider]  carvedilol (COREG) 3.125 MG tablet Take 1 tablet (3.125 mg total) by mouth 2 (two) times daily. 02/12/22   Furth, Cadence H, PA-C  clopidogrel (PLAVIX) 75 MG tablet TAKE 1 TABLET BY MOUTH EVERY DAY Patient taking differently: Take 75 mg by mouth daily. 09/22/21   Minna Merritts, MD  cyanocobalamin (VITAMIN B12) 1000 MCG tablet Take 1,000 mcg by mouth daily.    [provider]  ezetimibe (ZETIA) 10 MG tablet Take 1 tablet (10 mg total) by mouth daily. 04/08/22   Minna Merritts, MD  fludrocortisone (FLORINEF) 0.1 MG tablet Take 1 tablet (0.1 mg total) by mouth daily. 04/09/22   Cassandria Anger, MD  gabapentin (NEURONTIN) 100 MG capsule Take 1 capsule (100 mg total) by mouth 2 (two) times daily. 09/01/21   Venia Carbon, MD  hydrocortisone (CORTEF) 10 MG tablet Take 1 tablet daily at 8 AM, 1 tablet  daily at noon 04/09/22   Cassandria Anger, MD  mirtazapine (REMERON) 15 MG tablet TAKE 1 TABLET BY MOUTH EVERYDAY AT BEDTIME Patient taking differently: Take 15 mg by mouth at bedtime. 04/06/21   Venia Carbon, MD  nitroGLYCERIN (NITROSTAT) 0.4 MG SL tablet PLACE 1 TABLET UNDER THE TONGUE EVERY 5 MINUTES AS NEEDED FOR CHEST PAIN. Patient taking differently: Place 0.4 mg under the tongue every 5 (five) minutes as needed for chest pain. 08/13/21   Venia Carbon, MD  pantoprazole (PROTONIX) 40 MG tablet Take 1 tablet (40 mg total) by mouth 2 (two) times daily. 06/04/21   Venia Carbon, MD  ranolazine (RANEXA) 500 MG 12 hr tablet Take 1 tablet (500 mg total) by mouth 2 (two) times daily. 01/01/22   Minna Merritts, MD  rosuvastatin (CRESTOR) 40 MG tablet TAKE 1 TABLET BY MOUTH EVERY DAY Patient taking differently: Take 40 mg by mouth daily. 06/04/21   Minna Merritts, MD  spironolactone (ALDACTONE) 25 MG tablet Take 0.5 tablets (12.5 mg total) by mouth daily.  04/01/22 06/30/22  Kathlen Mody, Cadence H, PA-C    Inpatient Medications: Scheduled Meds:  Continuous Infusions:  PRN Meds:   Allergies:    Allergies  Allergen Reactions   Metoprolol Tartrate Hives and Itching   Quinolones Other (See Comments)    Contraindicated with AAA   Metoclopramide Nausea Only    Other reaction(s): Unknown    Social History:   Social History   Socioeconomic History   Marital status: Married    Spouse name: Not on file   Number of children: 3   Years of education: Not on file   Highest education level: Not on file  Occupational History   Occupation: Geneticist, molecular (Animal nutritionist)    Comment: Retired  Tobacco Use   Smoking status: Former    Packs/day: 3.00    Years: 25.00    Total pack years: 75.00    Types: Cigarettes    Quit date: 05/28/1993    Years since quitting: 28.9    Passive exposure: Past   Smokeless tobacco: Never  Vaping Use   Vaping Use: Never used  Substance and Sexual Activity   Alcohol use: Yes    Alcohol/week: 42.0 standard drinks of alcohol    Types: 42 Cans of beer per week   Drug use: No   Sexual activity: Not on file  Other Topics Concern   Not on file  Social History Narrative   Working on living will   No health care POA but requests wife--then children   Would accept resuscitation   Not sure about tube feeds   Social Determinants of Health   Financial Resource Strain: Not on file  Food Insecurity: No Food Insecurity (02/04/2022)   Hunger Vital Sign    Worried About Running Out of Food in the Last Year: Never true    Ran Out of Food in the Last Year: Never true  Transportation Needs: No Transportation Needs (02/04/2022)   PRAPARE - Hydrologist (Medical): No    Lack of Transportation (Non-Medical): No  Physical Activity: Not on file  Stress: Not on file  Social Connections: Not on file  Intimate Partner Violence: Not on file    Family History:   Family History  Problem  Relation Age of Onset   Coronary artery disease Mother        Died MI age 65   Hypertension Mother    Heart  attack Mother    Coronary artery disease Sister        Living   Coronary artery disease Brother        Living   Coronary artery disease Father        Died MI age 48   Heart attack Father    Heart attack Maternal Grandfather    Diabetes Neg Hx    Cancer Neg Hx        prostate or colon     ROS:  Please see the history of present illness.  All other ROS reviewed and negative.     Physical Exam/Data:   Vitals:   04/20/22 0940 04/20/22 0947 04/20/22 1230 04/20/22 1245  BP: (!) 155/100 (!) 155/100 (!) 165/79 136/79  Pulse: 75 72 62 62  Resp: (!) '22  19 19  '$ Temp: (!) 97.3 F (36.3 C) (!) 97.3 F (36.3 C)    TempSrc: Oral Oral    SpO2: 99% 99% 100% 100%   No intake or output data in the 24 hours ending 04/20/22 1316    04/09/2022   10:24 AM 03/25/2022    1:07 PM 03/08/2022    8:48 AM  Last 3 Weights  Weight (lbs) 172 lb 12.8 oz 167 lb 6.4 oz 169 lb 6.4 oz  Weight (kg) 78.382 kg 75.932 kg 76.839 kg     There is no height or weight on file to calculate BMI.  General:  Well nourished, well developed, in no acute distress HEENT: normal Neck: no JVD Vascular: R carotid bruit; Distal pulses 2+ bilaterally Cardiac:  normal S1, S2; RRR; no murmur  Lungs:  clear to auscultation bilaterally, no wheezing, rhonchi or rales  Abd: soft, nontender, no hepatomegaly  Ext: 1+ left lower extremity edema Musculoskeletal:  No deformities, BUE and BLE strength normal and equal Skin: warm and dry  Neuro:  CNs 2-12 intact, no focal abnormalities noted Psych:  Normal affect   EKG:  The EKG was personally reviewed and demonstrates:  sinus rhythm with occasional PVC's, cannot rule out age indeterminate anterior infarct  Relevant CV Studies: Echo 01-25-2022:  1. Left ventricular ejection fraction, by estimation, is 30 to 35%. The  left ventricle has moderately decreased function. The  left ventricle  demonstrates regional wall motion abnormalities (see scoring  diagram/findings for description). The left  ventricular internal cavity size was mildly dilated. Left ventricular  diastolic parameters are consistent with Grade I diastolic dysfunction  (impaired relaxation).   2. Right ventricular systolic function is moderately reduced. The right  ventricular size is normal. Tricuspid regurgitation signal is inadequate  for assessing PA pressure.   3. The mitral valve is normal in structure. Trivial mitral valve  regurgitation.   4. The aortic valve is tricuspid. Aortic valve regurgitation is not  visualized. Aortic valve sclerosis/calcification is present, without any  evidence of aortic stenosis.   5. The inferior vena cava is normal in size with greater than 50%  respiratory variability, suggesting right atrial pressure of 3 mmHg.   Laboratory Data:  High Sensitivity Troponin:   Recent Labs  Lab 04/20/22 0959  TROPONINIHS 831*     Chemistry Recent Labs  Lab 04/20/22 0959  NA 133*  K 3.6  CL 101  CO2 25  GLUCOSE 95  BUN 10  CREATININE 0.88  CALCIUM 8.8*  GFRNONAA >60  ANIONGAP 7    No results for input(s): "PROT", "ALBUMIN", "AST", "ALT", "ALKPHOS", "BILITOT" in the last 168 hours. Lipids No results for input(s): "  CHOL", "TRIG", "HDL", "LABVLDL", "LDLCALC", "CHOLHDL" in the last 168 hours.  Hematology Recent Labs  Lab 04/20/22 0959  WBC 8.0  RBC 4.24  HGB 10.6*  HCT 34.1*  MCV 80.4  MCH 25.0*  MCHC 31.1  RDW 19.6*  PLT 336   Thyroid No results for input(s): "TSH", "FREET4" in the last 168 hours.  BNP Recent Labs  Lab 04/20/22 0959  BNP 789.5*    DDimer No results for input(s): "DDIMER" in the last 168 hours.   Radiology/Studies:  DG Chest 2 View  Result Date: 04/20/2022 CLINICAL DATA:  Reason for Exam: SOB. Per Triage Notes: Pt reports worsening SOB over the past few weeks. SOB worse with laying flat or exertion. Denies any CP.  shortness of breath EXAM: CHEST - 2 VIEW COMPARISON:  None Available. FINDINGS: Sternotomy wires overlie normal cardiac silhouette. Cardiac stent noted. No effusion, infiltrate, or pneumothorax. Atherosclerotic calcification of the aorta. IMPRESSION: No acute cardiopulmonary process. Aortic Atherosclerosis (ICD10-I70.0). Electronically Signed   By: Suzy Bouchard M.D.   On: 04/20/2022 10:15     Assessment and Plan:   Acute on chronic heart failure with reduced ejection fraction Elevated troponin likely secondary to demand ischemia (type II non-STEMI) Peripheral arterial disease, stable Abdominal aortic aneurysm, asymptomatic, most recently measured at 4.5 cm, followed by vascular surgery  The patient has typical symptoms of progressive heart failure with increased dyspnea, orthopnea, and PND.  His chest x-ray is clear, but with elevated BNP and typical symptoms, he should be treated with IV diuresis.  He was started on a low-dose of spironolactone at his most recent office visit, but he has not been on any loop diuretic therapy.  Will treat him with IV furosemide 40 mg twice daily.  Potential contributors to her its heart failure exacerbation could include use of steroids for treatment of adrenal insufficiency and also sodium indiscretion in his diet.  We talked about dietary modification today.  The patient does not have any symptoms of angina and I do not think he needs to be treated for acute coronary syndrome.  I reviewed his last cardiac cath films and he was found to have continued patency of the LIMA to LAD which supplies a good portion of his myocardium.  He also had continued stent patency from the left main into the circumflex with mild to moderate nonobstructive restenosis confirmed by pressure wire analysis.  Would continue DAPT with aspirin and clopidogrel as well as his home medical regimen which includes a high intensity statin drug as well.   Risk Assessment/Risk Scores:        [place] Heart Association (NYHA) Functional Class NYHA Class III        For questions or updates, please contact [place] Please consult [email or web address] for contact info under    Signed, Sherren Mocha, MD  04/20/2022 1:16 PM

## 2022-04-20 NOTE — Telephone Encounter (Signed)
[facility] - Client TELEPHONE ADVICE RECORD [facility] Patient Name: [name] Gender: Male DOB: [birth year] Age: 71 Y 57 M 3 D Return Phone Number: [phone] (Primary), [phone] (Secondary) Address: City/ State/ Zip: [place] [address] Client [facility] - Client Client Site [place] - Day Provider Viviana Simpler- MD Contact Type Call Who Is Calling Patient / Member / Family / Caregiver Call Type Triage / Clinical Caller Name Manuela Schwartz Relationship To Patient Spouse Return Phone Number [phone] (Primary) Chief Complaint BREATHING - shortness of breath or sounds breathless Reason for Call Symptomatic / Request for [place] states her husband is having a hard time breathing. Translation No Nurse Assessment Nurse: Tanya Nones, RN, Jordan Hawks Date/Time (Eastern Time): 04/20/2022 8:23:51 AM Confirm and document reason for call. If symptomatic, describe symptoms. ---Caller states her husband is having a hard time breathing. She reports he has been getting worse the past week. She called the provider before Christmas regarding him. She reports he feels better when he is sitting up or standing up. His SPO2:99% BP:142/81. She reports no cough just difficulty breathing. Does the patient have any new or worsening symptoms? ---Yes Will a triage be completed? ---Yes Related visit to physician within the last 2 weeks? ---No Does the PT have any chronic conditions? (i.e. diabetes, asthma, this includes High risk factors for pregnancy, etc.) ---Yes List chronic conditions. ---Addison's disease, prediabetic, quad coronary bypass (1995), multiple heart attacks, AAA Is this a behavioral health or substance abuse call? ---No Guidelines Guideline Title Affirmed Question Affirmed Notes Nurse Date/Time (Eastern Time) Breathing Difficulty [1] MODERATE difficulty  breathing (e.g., speaks in phrases, SOB even at rest, pulse 100-120) Tanya Nones, RN, [place] 04/20/2022 8:27:45 AM PLEASE NOTE: All timestamps contained within this report are represented as [place] Standard Time. CONFIDENTIALTY NOTICE: This fax transmission is intended only for the addressee. It contains information that is legally privileged, confidential or otherwise protected from use or disclosure. If you are not the intended recipient, you are strictly prohibited from reviewing, disclosing, copying using or disseminating any of this information or taking any action in reliance on or regarding this information. If you have received this fax in error, please notify us immediately by telephone so that we can arrange for its return to [place]. Phone: [phone], Toll-Free: [phone], Fax: [phone] Page: 2 of 2 Call Id: [id number] Guidelines Guideline Title Affirmed Question Affirmed Notes Nurse Date/Time Eilene Ghazi Time) AND [2] NEW-onset or WORSE than normal Disp. Time Eilene Ghazi Time) Disposition Final User 04/20/2022 8:20:24 AM Send to Urgent Queue Madalyn Rob 04/20/2022 8:31:12 AM Go to ED Now Yes Tanya Nones, RN, [place] Final Disposition 04/20/2022 8:31:12 AM Go to ED Now Yes Tanya Nones, RN, Beatrix Shipper Disagree/Comply Comply Caller Understands Yes PreDisposition InappropriateToAsk Care Advice Given Per Guideline GO TO ED NOW: * You need to be seen in the Emergency Department. * Go to the ED at ___________ [place] now. Drive carefully. NOTE TO TRIAGER - DRIVING: * Another adult should drive. * Patient should not delay going to the emergency department. BRING MEDICINES: * Bring a list of your current medicines when you go to the Emergency Department (ER). CALL EMS 911 IF: * Call EMS if you become worse. CARE ADVICE given per Breathing Difficulty (Adult) guideline. Referrals [place]

## 2022-04-20 NOTE — H&P (Addendum)
History and Physical    DELORES THELEN ERX:[id number] DOB: [birth year] DOA: 04/20/2022  PCP: Venia Carbon, MD (Confirm with patient/family/NH records and if not entered, this has to be entered at [facility] point of entry) Patient coming from: Home  I have personally briefly reviewed patient's old medical records in [place]  Chief Complaint: SOB, can not lie flat  HPI: [name] is a 71 y.o. male with medical history significant of CAD status post CABG and stenting, chronic HFrEF LVEF 30-35%, ischemic cardiomyopathy, Addison's disease recently diagnosed, HTN, AAA, PVD, ended with worsening of shortness of breath.  Symptoms started about 9-10 days ago with progressively worsening of exertional dyspnea, and last 2 nights, cannot sleep on 1 pillow.  Denies any chest pain, no cough no fever or chills.  Recently, patient was seen by endocrinologist who diagnosed patient with Addison's disease and patient was started on fludrocortisone and hydrocortisone about 3 weeks ago.  Since then, patient has had significantly increased appetite and gained "quite some weight".  He also reported his blood pressure significantly increased compared to before, most occasions SBP in the 110s-130s compared to before SBP in the 80s.  Patient recent stress test showed intermittent risk with fixed ischemia along with unamenable stenosis.  Cardiology aware.  ED Course: Afebrile, none tachycardia, blood pressure significant elevated SBP in the 150s, no hypoxia.  Chest x-ray showed pulmonary congestion.  Blood work showed 3.6, creatinine 0.8, troponin first at 831  Review of Systems: As per HPI otherwise 14 point review of systems negative.    Past Medical History:  Diagnosis Date   AAA (abdominal aortic aneurysm) ([place])    a. Duplex 01/2012: stable infrarenal saccular AAA at 3.3cm x 3.2xm (f/u recommended 01/2013 per Dr. Rockey Situ)   Alcohol abuse    CAD (coronary artery disease)    a. 1995 s/p CABG x 4:  LIMA->LAD, VG->RI (known to be occluded), VG->AM->PDA;  b. 05/2002 Inf STEMI: VG->AM->PDA 100% treated w/ 2 BMS complicated by acute thrombosis req 3 BMS;  c. 07/2003 DES to  LAD & LCX (VG's to PDA & RI 100%);  d. 12/2010 Acute MI ([place]): DES to LCX & LM , LIMA ok,;  e.12/2011 Cath: LM/LCX stents ok , LIMA patent.   Cardiomyopathy ([place])    a. EF 35% by cath 2015.   Diverticulitis    1/06 Diverticulitis--CT of pelvis--diffuse sigmoid divertic   Dyspnea    GERD (gastroesophageal reflux disease)    Hyperlipidemia    Hypertension    Myocardial infarction [facility])    PAD (peripheral artery disease) ([place])    a. external iliac and mesenteric stenosis noted by noninvasive imaging.   Renal artery stenosis ([place])    a. 03/2011 PTA and stenting of L RA. b. last duplex 2016 with stable 1-59% bilateral RAS, incidental >50% R EIA stenosis    Past Surgical History:  Procedure Laterality Date   [place]     8/09  Cath--vein graft occlusions which are old--no acute changes   CARDIAC CATHETERIZATION  11/13/2010   stent x 2 @ [place]   CARDIAC CATHETERIZATION  04/05/2014   stent placement    CARDIAC CATHETERIZATION  10/28/2017   CLOSED REDUCTION SHOULDER DISLOCATION     COLONOSCOPY WITH PROPOFOL N/A 12/22/2018   Procedure: COLONOSCOPY WITH PROPOFOL;  Surgeon: Jonathon Bellows, MD;  Location: [facility] ENDOSCOPY;  Service: Gastroenterology;  Laterality: N/A;   CORONARY ANGIOPLASTY  04/05/2014   stent placement OM 1  CORONARY ARTERY BYPASS GRAFT     CORONARY STENT PLACEMENT  7/12   2 stents--Promus element plus (everolimus eluting)--[facility] in [place] WIRE/FFR STUDY N/A 05/13/2021   Procedure: INTRAVASCULAR PRESSURE WIRE/FFR STUDY;  Surgeon: Early Osmond, MD;  Location: [place] CV LAB;  Service: Cardiovascular;  Laterality: N/A;   LEFT HEART CATH AND CORONARY ANGIOGRAPHY N/A 05/13/2021   Procedure: LEFT HEART CATH AND CORONARY ANGIOGRAPHY;   Surgeon: Early Osmond, MD;  Location: [place] CV LAB;  Service: Cardiovascular;  Laterality: N/A;   LEFT HEART CATH AND CORS/GRAFTS ANGIOGRAPHY N/A 10/28/2017   Procedure: LEFT HEART CATH AND CORS/GRAFTS ANGIOGRAPHY;  Surgeon: Leonie Man, MD;  Location: [place] CV LAB;  Service: Cardiovascular;  Laterality: N/A;   LEFT HEART CATHETERIZATION WITH CORONARY/GRAFT ANGIOGRAM N/A 01/04/2012   Procedure: LEFT HEART CATHETERIZATION WITH Beatrix Fetters;  Surgeon: Burnell Blanks, MD;  Location: [facility] CATH LAB;  Service: Cardiovascular;  Laterality: N/A;   LEFT HEART CATHETERIZATION WITH CORONARY/GRAFT ANGIOGRAM N/A 04/05/2014   Procedure: LEFT HEART CATHETERIZATION WITH Beatrix Fetters;  Surgeon: Jettie Booze, MD;  Location: [facility] CATH LAB;  Service: Cardiovascular;  Laterality: N/A;   PERCUTANEOUS CORONARY STENT INTERVENTION (PCI-S)  04/05/2014   Procedure: PERCUTANEOUS CORONARY STENT INTERVENTION (PCI-S);  Surgeon: Jettie Booze, MD;  Location: [facility] CATH LAB;  Service: Cardiovascular;;  OM1   RENAL ANGIOGRAM N/A 04/16/2011   Procedure: RENAL ANGIOGRAM;  Surgeon: Burnell Blanks, MD;  Location: [facility] CATH LAB;  Service: Cardiovascular;  Laterality: N/A;   RENAL ARTERY STENT  03/2011 ?     reports that he quit smoking about 28 years ago. His smoking use included cigarettes. He has a 75.00 pack-year smoking history. He has been exposed to tobacco smoke. He has never used smokeless tobacco. He reports current alcohol use of about 42.0 standard drinks of alcohol per week. He reports that he does not use drugs.  Allergies  Allergen Reactions   Metoprolol Tartrate Hives and Itching   Quinolones Other (See Comments)    Contraindicated with AAA   Metoclopramide Nausea Only    Other reaction(s): Unknown    Family History  Problem Relation Age of Onset   Coronary artery disease Mother        Died MI age 18   Hypertension Mother    Heart attack Mother     Coronary artery disease Sister        Living   Coronary artery disease Brother        Living   Coronary artery disease Father        Died MI age 80   Heart attack Father    Heart attack Maternal Grandfather    Diabetes Neg Hx    Cancer Neg Hx        prostate or colon     Prior to Admission medications   Medication Sig Start Date End Date Taking? Authorizing Provider  aspirin 81 MG tablet Take 81 mg by mouth daily.    [provider]  carvedilol (COREG) 3.125 MG tablet Take 1 tablet (3.125 mg total) by mouth 2 (two) times daily. 02/12/22   Furth, Cadence H, PA-C  clopidogrel (PLAVIX) 75 MG tablet TAKE 1 TABLET BY MOUTH EVERY DAY Patient taking differently: Take 75 mg by mouth daily. 09/22/21   Minna Merritts, MD  cyanocobalamin (VITAMIN B12) 1000 MCG tablet Take 1,000 mcg by mouth daily.    [provider]  ezetimibe (ZETIA) 10  MG tablet Take 1 tablet (10 mg total) by mouth daily. 04/08/22   Minna Merritts, MD  fludrocortisone (FLORINEF) 0.1 MG tablet Take 1 tablet (0.1 mg total) by mouth daily. 04/09/22   Cassandria Anger, MD  gabapentin (NEURONTIN) 100 MG capsule Take 1 capsule (100 mg total) by mouth 2 (two) times daily. 09/01/21   Venia Carbon, MD  hydrocortisone (CORTEF) 10 MG tablet Take 1 tablet daily at 8 AM, 1 tablet daily at noon Patient taking differently: Take 10 mg by mouth daily. Take 1 tablet daily at 8 AM, 1 tablet daily at noon 04/09/22   Cassandria Anger, MD  mirtazapine (REMERON) 15 MG tablet TAKE 1 TABLET BY MOUTH EVERYDAY AT BEDTIME Patient taking differently: Take 15 mg by mouth at bedtime. 04/06/21   Venia Carbon, MD  nitroGLYCERIN (NITROSTAT) 0.4 MG SL tablet PLACE 1 TABLET UNDER THE TONGUE EVERY 5 MINUTES AS NEEDED FOR CHEST PAIN. Patient taking differently: Place 0.4 mg under the tongue every 5 (five) minutes as needed for chest pain (call 911). 08/13/21   Venia Carbon, MD  pantoprazole (PROTONIX) 40 MG tablet Take 1  tablet (40 mg total) by mouth 2 (two) times daily. 06/04/21   Venia Carbon, MD  ranolazine (RANEXA) 500 MG 12 hr tablet Take 1 tablet (500 mg total) by mouth 2 (two) times daily. 01/01/22   Minna Merritts, MD  rosuvastatin (CRESTOR) 40 MG tablet TAKE 1 TABLET BY MOUTH EVERY DAY Patient taking differently: Take 40 mg by mouth daily. 06/04/21   Minna Merritts, MD  spironolactone (ALDACTONE) 25 MG tablet Take 0.5 tablets (12.5 mg total) by mouth daily. 04/01/22 06/30/22  Antony Madura, PA-C    Physical Exam: Vitals:   04/20/22 0940 04/20/22 0947 04/20/22 1230 04/20/22 1245  BP: (!) 155/100 (!) 155/100 (!) 165/79 136/79  Pulse: 75 72 62 62  Resp: (!) '22  19 19  '$ Temp: (!) 97.3 F (36.3 C) (!) 97.3 F (36.3 C)    TempSrc: Oral Oral    SpO2: 99% 99% 100% 100%    Constitutional: NAD, calm, comfortable Vitals:   04/20/22 0940 04/20/22 0947 04/20/22 1230 04/20/22 1245  BP: (!) 155/100 (!) 155/100 (!) 165/79 136/79  Pulse: 75 72 62 62  Resp: (!) '22  19 19  '$ Temp: (!) 97.3 F (36.3 C) (!) 97.3 F (36.3 C)    TempSrc: Oral Oral    SpO2: 99% 99% 100% 100%   Eyes: PERRL, lids and conjunctivae normal ENMT: Mucous membranes are moist. Posterior pharynx clear of any exudate or lesions.Normal dentition.  Neck: normal, supple, no masses, no thyromegaly Respiratory: clear to auscultation bilaterally, no wheezing, increasing bilateral lower field crackles, increasing breathing effort. No accessory muscle use.  Cardiovascular: Regular rate and rhythm, no murmurs / rubs / gallops.  Trace extremity edema. 2+ pedal pulses. No carotid bruits.  Abdomen: no tenderness, no masses palpated. No hepatosplenomegaly. Bowel sounds positive.  Musculoskeletal: no clubbing / cyanosis. No joint deformity upper and lower extremities. Good ROM, no contractures. Normal muscle tone.  Skin: no rashes, lesions, ulcers. No induration Neurologic: CN 2-12 grossly intact. Sensation intact, DTR normal. Strength 5/5 in all  4.  Psychiatric: Normal judgment and insight. Alert and oriented x 3. Normal mood.     Labs on Admission: I have personally reviewed following labs and imaging studies  CBC: Recent Labs  Lab 04/20/22 0959  WBC 8.0  NEUTROABS 4.9  HGB 10.6*  HCT 34.1*  MCV 80.4  PLT 798   Basic Metabolic Panel: Recent Labs  Lab 04/20/22 0959  NA 133*  K 3.6  CL 101  CO2 25  GLUCOSE 95  BUN 10  CREATININE 0.88  CALCIUM 8.8*   GFR: Estimated Creatinine Clearance: 71.3 mL/min (by C-G formula based on SCr of 0.88 mg/dL). Liver Function Tests: No results for input(s): "AST", "ALT", "ALKPHOS", "BILITOT", "PROT", "ALBUMIN" in the last 168 hours. No results for input(s): "LIPASE", "AMYLASE" in the last 168 hours. No results for input(s): "AMMONIA" in the last 168 hours. Coagulation Profile: No results for input(s): "INR", "PROTIME" in the last 168 hours. Cardiac Enzymes: No results for input(s): "CKTOTAL", "CKMB", "CKMBINDEX", "TROPONINI" in the last 168 hours. BNP (last 3 results) No results for input(s): "PROBNP" in the last 8760 hours. HbA1C: No results for input(s): "HGBA1C" in the last 72 hours. CBG: No results for input(s): "GLUCAP" in the last 168 hours. Lipid Profile: No results for input(s): "CHOL", "HDL", "LDLCALC", "TRIG", "CHOLHDL", "LDLDIRECT" in the last 72 hours. Thyroid Function Tests: No results for input(s): "TSH", "T4TOTAL", "FREET4", "T3FREE", "THYROIDAB" in the last 72 hours. Anemia Panel: No results for input(s): "VITAMINB12", "FOLATE", "FERRITIN", "TIBC", "IRON", "RETICCTPCT" in the last 72 hours. Urine analysis:    Component Value Date/Time   COLORURINE STRAW (A) 01/25/2022 0038   APPEARANCEUR CLEAR 01/25/2022 0038   LABSPEC 1.018 01/25/2022 0038   PHURINE 7.0 01/25/2022 0038   GLUCOSEU NEGATIVE 01/25/2022 0038   HGBUR NEGATIVE 01/25/2022 0038   HGBUR negative 04/17/2009 1024   BILIRUBINUR NEGATIVE 01/25/2022 0038   KETONESUR NEGATIVE 01/25/2022 0038    PROTEINUR NEGATIVE 01/25/2022 0038   UROBILINOGEN 0.2 09/29/2013 1637   NITRITE NEGATIVE 01/25/2022 0038   LEUKOCYTESUR NEGATIVE 01/25/2022 0038    Radiological Exams on Admission: DG Chest 2 View  Result Date: 04/20/2022 CLINICAL DATA:  Reason for Exam: SOB. Per Triage Notes: Pt reports worsening SOB over the past few weeks. SOB worse with laying flat or exertion. Denies any CP. shortness of breath EXAM: CHEST - 2 VIEW COMPARISON:  None Available. FINDINGS: Sternotomy wires overlie normal cardiac silhouette. Cardiac stent noted. No effusion, infiltrate, or pneumothorax. Atherosclerotic calcification of the aorta. IMPRESSION: No acute cardiopulmonary process. Aortic Atherosclerosis (ICD10-I70.0). Electronically Signed   By: Suzy Bouchard M.D.   On: 04/20/2022 10:15    EKG: Independently reviewed.  Sinus rhythm with frequent PVCs  Assessment/Plan Active Problems:   Essential hypertension   Chronic combined systolic and diastolic heart failure (HCC)   Adrenal insufficiency (HCC)   Addison disease ([place])   NSTEMI (non-ST elevated myocardial infarction) ([place])  (please populate well all problems here in Problem List. (For example, if patient is on BP meds at home and you resume or decide to hold them, it is a problem that needs to be her. Same for CAD, COPD, HLD and so on)  Acute on chronic HFrEF decompensation -Appears to related to fluid accumulation and blood pressure elevation likely secondary to mineral steroid and corticosteroid started recently for Addison's disease. -Lasix x 1, now and depends on symptoms changes, consider additional diuresis -Will increase Coreg from 3.125 to 6.25 mg twice daily and continue current dose of spironolactone.  May need to discharge home with at least as needed Lasix. -KCl 40 mEq x 1 given -Echocardiogram was done along with stress test recently, will not repeat.  Elevated troponins -No chest pains, no significant EKG changes, consider elevated  troponin related to CHF decompensation and demanding ischemia -Cardiology on board -Patient  has a known unremarkable distal stenosis at proximal LAD in recent cardiac cath January 2023 -Continue aspirin and Plavix, cardiology to decide whether we need to heparin drip at this point.  Addison's disease -Will continue home dose of fludrocortisone and hydrocortisone dosage for now -Encourage patient to talk to his endocrinologist regarding whether lower dosage for either or both fludrocortisone/hydrocortisone is discharged given patient has significant CHF history.  If either dosage is adjustable, may consider patient need additional BP/diuresis medications.  Patient and his family expressed understanding and agreed.  Frequent PVCs -KCl 40 mg x 1, increase Coreg dosage as above  HTN -Increase Coreg, continue same dose of spironolactone.  Thoracic aortic aneurysm -On beta-blocker, 4.4 x4.5 cm in Oct 2023, outpatient CT surgery follow-up.  DVT prophylaxis: Heparin subcu Code Status: Full code Family Communication: Wife at bedside Disposition Plan: Expect less than 2 midnight hospital stay Consults called: Cardiology Admission status: Telemetry observation   Lequita Halt MD [facility] Pager [phone] 04/20/2022, 1:47 PM

## 2022-04-20 NOTE — ED Provider Triage Note (Cosign Needed Addendum)
Emergency Medicine Provider Triage Evaluation Note  [name] , a 71 y.o. male  was evaluated in triage.  Pt complains of shortness of breath worsening over the past several weeks. Notes that his breathing is worse with exertion and when he is laying flat at night.  Notes that he has had to sit up at night to go to sleep due to issues with his breathing.  Denies past medical history of asthma, COPD, CHF.  Has an extensive cardiac history with multiple MIs.  Denies chest pain at this time.  Review of Systems  Positive:  Negative:   Physical Exam  BP (!) 155/100   Pulse 75   Temp (!) 97.3 F (36.3 C) (Oral)   Resp (!) 22   SpO2 99%  Gen:   Awake, no distress   Resp:  Normal effort, increased work of breathing MSK:   Moves extremities without difficulty Other:   Medical Decision Making  Medically screening exam initiated at 9:47 AM.  Appropriate orders placed.  [name] was informed that the remainder of the evaluation will be completed by another provider, this initial triage assessment does not replace that evaluation, and the importance of remaining in the ED until their evaluation is complete.    [name] A, PA-C 04/20/22 0948   11:29 AM - notified by RN of elevated troponin at 831, RN aware to provide patient with room.    [name] A, PA-C 04/20/22 1132

## 2022-04-21 ENCOUNTER — Observation Stay (HOSPITAL_COMMUNITY): Payer: Medicare Other

## 2022-04-21 DIAGNOSIS — E271 Primary adrenocortical insufficiency: Secondary | ICD-10-CM | POA: Diagnosis present

## 2022-04-21 DIAGNOSIS — I21A1 Myocardial infarction type 2: Secondary | ICD-10-CM | POA: Diagnosis present

## 2022-04-21 DIAGNOSIS — I5021 Acute systolic (congestive) heart failure: Secondary | ICD-10-CM

## 2022-04-21 DIAGNOSIS — Z1152 Encounter for screening for COVID-19: Secondary | ICD-10-CM | POA: Diagnosis not present

## 2022-04-21 DIAGNOSIS — Z7902 Long term (current) use of antithrombotics/antiplatelets: Secondary | ICD-10-CM | POA: Diagnosis not present

## 2022-04-21 DIAGNOSIS — Z955 Presence of coronary angioplasty implant and graft: Secondary | ICD-10-CM | POA: Diagnosis not present

## 2022-04-21 DIAGNOSIS — E871 Hypo-osmolality and hyponatremia: Secondary | ICD-10-CM | POA: Diagnosis present

## 2022-04-21 DIAGNOSIS — Z8249 Family history of ischemic heart disease and other diseases of the circulatory system: Secondary | ICD-10-CM | POA: Diagnosis not present

## 2022-04-21 DIAGNOSIS — I214 Non-ST elevation (NSTEMI) myocardial infarction: Secondary | ICD-10-CM | POA: Diagnosis not present

## 2022-04-21 DIAGNOSIS — E785 Hyperlipidemia, unspecified: Secondary | ICD-10-CM | POA: Diagnosis present

## 2022-04-21 DIAGNOSIS — I2489 Other forms of acute ischemic heart disease: Secondary | ICD-10-CM | POA: Diagnosis not present

## 2022-04-21 DIAGNOSIS — I509 Heart failure, unspecified: Secondary | ICD-10-CM | POA: Diagnosis present

## 2022-04-21 DIAGNOSIS — I493 Ventricular premature depolarization: Secondary | ICD-10-CM | POA: Diagnosis present

## 2022-04-21 DIAGNOSIS — Z87891 Personal history of nicotine dependence: Secondary | ICD-10-CM | POA: Diagnosis not present

## 2022-04-21 DIAGNOSIS — I252 Old myocardial infarction: Secondary | ICD-10-CM | POA: Diagnosis not present

## 2022-04-21 DIAGNOSIS — I7143 Infrarenal abdominal aortic aneurysm, without rupture: Secondary | ICD-10-CM | POA: Diagnosis present

## 2022-04-21 DIAGNOSIS — I959 Hypotension, unspecified: Secondary | ICD-10-CM

## 2022-04-21 DIAGNOSIS — Z7982 Long term (current) use of aspirin: Secondary | ICD-10-CM | POA: Diagnosis not present

## 2022-04-21 DIAGNOSIS — Z951 Presence of aortocoronary bypass graft: Secondary | ICD-10-CM | POA: Diagnosis not present

## 2022-04-21 DIAGNOSIS — I110 Hypertensive heart disease with heart failure: Secondary | ICD-10-CM | POA: Diagnosis present

## 2022-04-21 DIAGNOSIS — I2510 Atherosclerotic heart disease of native coronary artery without angina pectoris: Secondary | ICD-10-CM | POA: Diagnosis present

## 2022-04-21 DIAGNOSIS — I714 Abdominal aortic aneurysm, without rupture, unspecified: Secondary | ICD-10-CM | POA: Diagnosis present

## 2022-04-21 DIAGNOSIS — I5043 Acute on chronic combined systolic (congestive) and diastolic (congestive) heart failure: Secondary | ICD-10-CM | POA: Diagnosis present

## 2022-04-21 DIAGNOSIS — I255 Ischemic cardiomyopathy: Secondary | ICD-10-CM | POA: Diagnosis present

## 2022-04-21 DIAGNOSIS — I472 Ventricular tachycardia, unspecified: Secondary | ICD-10-CM | POA: Diagnosis not present

## 2022-04-21 DIAGNOSIS — Z79899 Other long term (current) drug therapy: Secondary | ICD-10-CM | POA: Diagnosis not present

## 2022-04-21 DIAGNOSIS — K219 Gastro-esophageal reflux disease without esophagitis: Secondary | ICD-10-CM | POA: Diagnosis present

## 2022-04-21 DIAGNOSIS — E1151 Type 2 diabetes mellitus with diabetic peripheral angiopathy without gangrene: Secondary | ICD-10-CM | POA: Diagnosis present

## 2022-04-21 LAB — BASIC METABOLIC PANEL
Anion gap: 8 (ref 5–15)
BUN: 13 mg/dL (ref 8–23)
CO2: 24 mmol/L (ref 22–32)
Calcium: 9 mg/dL (ref 8.9–10.3)
Chloride: 101 mmol/L (ref 98–111)
Creatinine, Ser: 1 mg/dL (ref 0.61–1.24)
GFR, Estimated: >60 mL/min (ref >60)
Glucose, Bld: 98 mg/dL (ref 70–99)
Potassium: 3.9 mmol/L (ref 3.5–5.1)
Sodium: 133 mmol/L — ABNORMAL LOW (ref 135–145)

## 2022-04-21 LAB — LACTIC ACID, PLASMA
Lactic Acid, Venous: 1.4 mmol/L (ref 0.5–1.9)
Lactic Acid, Venous: 2 mmol/L (ref 0.5–1.9)

## 2022-04-21 LAB — ECHOCARDIOGRAM LIMITED
Calc EF: 44.8 %
S' Lateral: 4.8 cm
Single Plane A2C EF: 42.5 %
Single Plane A4C EF: 43.9 %

## 2022-04-21 LAB — MAGNESIUM: Magnesium: 2.7 mg/dL — ABNORMAL HIGH (ref 1.7–2.4)

## 2022-04-21 MED ORDER — SODIUM CHLORIDE 0.9% FLUSH: 3.0000 mL | INTRAVENOUS | Status: DC | PRN

## 2022-04-21 MED ORDER — SODIUM CHLORIDE 0.9 % IV SOLN: INTRAVENOUS | Status: DC

## 2022-04-21 MED ORDER — HYDROCORTISONE 5 MG PO TABS: 5.0000 mg | ORAL_TABLET | Freq: Every day | ORAL | Status: DC

## 2022-04-21 MED ORDER — HYDROCORTISONE 5 MG PO TABS
5.0000 mg | ORAL_TABLET | Freq: Every day | ORAL | Status: DC
  Filled 2022-04-21: qty 1

## 2022-04-21 MED ORDER — MAGNESIUM SULFATE 2 GM/50ML IV SOLN
2.0000 g | Freq: Once | INTRAVENOUS | Status: AC
  Administered 2022-04-21: 2 g via INTRAVENOUS
  Filled 2022-04-21: qty 50

## 2022-04-21 MED ORDER — PERFLUTREN LIPID MICROSPHERE
1.0000 mL | INTRAVENOUS | Status: AC | PRN
  Administered 2022-04-21: 3 mL via INTRAVENOUS

## 2022-04-21 MED ORDER — HYDROCORTISONE SOD SUC (PF) 100 MG IJ SOLR
50.0000 mg | Freq: Four times a day (QID) | INTRAMUSCULAR | Status: DC
  Administered 2022-04-22: 50 mg via INTRAVENOUS
  Filled 2022-04-21 (×4): qty 1

## 2022-04-21 MED ORDER — POTASSIUM CHLORIDE CRYS ER 20 MEQ PO TBCR
40.0000 meq | EXTENDED_RELEASE_TABLET | Freq: Once | ORAL | Status: AC
  Administered 2022-04-21: 40 meq via ORAL
  Filled 2022-04-21: qty 2

## 2022-04-21 MED ORDER — SODIUM CHLORIDE 0.9% FLUSH
3.0000 mL | Freq: Two times a day (BID) | INTRAVENOUS | Status: DC
  Administered 2022-04-21 (×2): 3 mL via INTRAVENOUS

## 2022-04-21 MED ORDER — DIGOXIN 125 MCG PO TABS: 0.1250 mg | ORAL_TABLET | Freq: Every day | ORAL | Status: DC

## 2022-04-21 MED ORDER — SODIUM CHLORIDE 0.9 % IV SOLN: 250.0000 mL | INTRAVENOUS | Status: DC | PRN

## 2022-04-21 MED ORDER — FUROSEMIDE 10 MG/ML IJ SOLN
80.0000 mg | Freq: Two times a day (BID) | INTRAMUSCULAR | Status: DC
  Administered 2022-04-21: 80 mg via INTRAVENOUS
  Filled 2022-04-21: qty 8

## 2022-04-21 MED ORDER — CARVEDILOL 3.125 MG PO TABS
3.1250 mg | ORAL_TABLET | Freq: Two times a day (BID) | ORAL | Status: DC
  Administered 2022-04-21: 3.125 mg via ORAL

## 2022-04-21 MED ORDER — HYDROCORTISONE 10 MG PO TABS
10.0000 mg | ORAL_TABLET | Freq: Every day | ORAL | Status: DC
  Administered 2022-04-21: 10 mg via ORAL

## 2022-04-21 MED ORDER — HYDROCORTISONE SOD SUC (PF) 100 MG IJ SOLR
100.0000 mg | Freq: Once | INTRAMUSCULAR | Status: AC
  Administered 2022-04-21: 100 mg via INTRAVENOUS
  Filled 2022-04-21: qty 2

## 2022-04-21 MED ORDER — HYDROCORTISONE 10 MG PO TABS: 10.0000 mg | ORAL_TABLET | Freq: Every day | ORAL | Status: DC

## 2022-04-21 NOTE — H&P (View-Only) (Signed)
Rounding Note    Patient Name: [name] Date of Encounter: 04/21/2022  [place] Cardiologist: Ida Rogue, MD   Subjective   Patient reports that his breathing has not improved yet. Continues to have orthopnea, occasional SOB. Denies chest pain, palpitations, ankle edema   Inpatient Medications    Scheduled Meds:  aspirin EC  81 mg Oral Daily   carvedilol  6.25 mg Oral BID   clopidogrel  75 mg Oral Daily   cyanocobalamin  1,000 mcg Oral Daily   enoxaparin (LOVENOX) injection  40 mg Subcutaneous Q24H   ezetimibe  10 mg Oral Daily   fludrocortisone  0.1 mg Oral Daily   furosemide  80 mg Intravenous BID   gabapentin  100 mg Oral BID   hydrocortisone  10 mg Oral Daily   mirtazapine  15 mg Oral QHS   pantoprazole  40 mg Oral BID   ranolazine  500 mg Oral BID   rosuvastatin  40 mg Oral Daily   sodium chloride flush  3 mL Intravenous Q12H   spironolactone  12.5 mg Oral Daily   Continuous Infusions:  sodium chloride     PRN Meds: sodium chloride, acetaminophen, ondansetron (ZOFRAN) IV, sodium chloride flush   Vital Signs    Vitals:   04/21/22 0339 04/21/22 0400 04/21/22 0500 04/21/22 0600  BP:  99/75 113/76 124/62  Pulse:  65 62 61  Resp:  20 19 (!) 21  Temp: 98.1 F (36.7 C)     TempSrc: Oral     SpO2:  94% 97% 98%    Intake/Output Summary (Last 24 hours) at 04/21/2022 0837 Last data filed at 04/21/2022 0600 Gross per 24 hour  Intake --  Output 1125 ml  Net -1125 ml      04/09/2022   10:24 AM 03/25/2022    1:07 PM 03/08/2022    8:48 AM  Last 3 Weights  Weight (lbs) 172 lb 12.8 oz 167 lb 6.4 oz 169 lb 6.4 oz  Weight (kg) 78.382 kg 75.932 kg 76.839 kg      Telemetry    Sinus rhythm with frequent PVCs, occasional 3 beat runs of NSVT - Personally Reviewed  ECG    Sinus rhythm with PVCs, HR 70 BPM - Personally Reviewed  Physical Exam   GEN: No acute distress.  Sitting upright on the side of the bed eating breakfast  Neck:  No JVD Cardiac: RRR, no murmurs, rubs, or gallops.  Respiratory: Clear to auscultation bilaterally. GI: Soft, nontender, non-distended  MS: No edema; No deformity. Neuro:  Nonfocal  Psych: Normal affect   Labs    High Sensitivity Troponin:   Recent Labs  Lab 04/20/22 0959 04/20/22 1411  TROPONINIHS 831* 723*     Chemistry Recent Labs  Lab 04/20/22 0959 04/21/22 0335  NA 133* 133*  K 3.6 3.9  CL 101 101  CO2 25 24  GLUCOSE 95 98  BUN 10 13  CREATININE 0.88 1.00  CALCIUM 8.8* 9.0  GFRNONAA >60 >60  ANIONGAP 7 8    Lipids No results for input(s): "CHOL", "TRIG", "HDL", "LABVLDL", "LDLCALC", "CHOLHDL" in the last 168 hours.  Hematology Recent Labs  Lab 04/20/22 0959  WBC 8.0  RBC 4.24  HGB 10.6*  HCT 34.1*  MCV 80.4  MCH 25.0*  MCHC 31.1  RDW 19.6*  PLT 336   Thyroid No results for input(s): "TSH", "FREET4" in the last 168 hours.  BNP Recent Labs  Lab 04/20/22 0959  BNP 789.5*  DDimer No results for input(s): "DDIMER" in the last 168 hours.   Radiology    DG Chest 2 View  Result Date: 04/20/2022 CLINICAL DATA:  Reason for Exam: SOB. Per Triage Notes: Pt reports worsening SOB over the past few weeks. SOB worse with laying flat or exertion. Denies any CP. shortness of breath EXAM: CHEST - 2 VIEW COMPARISON:  None Available. FINDINGS: Sternotomy wires overlie normal cardiac silhouette. Cardiac stent noted. No effusion, infiltrate, or pneumothorax. Atherosclerotic calcification of the aorta. IMPRESSION: No acute cardiopulmonary process. Aortic Atherosclerosis (ICD10-I70.0). Electronically Signed   By: Suzy Bouchard M.D.   On: 04/20/2022 10:15    Cardiac Studies   Echocardiogram 01/25/22 1. Left ventricular ejection fraction, by estimation, is 30 to 35%. The  left ventricle has moderately decreased function. The left ventricle  demonstrates regional wall motion abnormalities (see scoring  diagram/findings for description). The left  ventricular  internal cavity size was mildly dilated. Left ventricular  diastolic parameters are consistent with Grade I diastolic dysfunction  (impaired relaxation).   2. Right ventricular systolic function is moderately reduced. The right  ventricular size is normal. Tricuspid regurgitation signal is inadequate  for assessing PA pressure.   3. The mitral valve is normal in structure. Trivial mitral valve  regurgitation.   4. The aortic valve is tricuspid. Aortic valve regurgitation is not  visualized. Aortic valve sclerosis/calcification is present, without any  evidence of aortic stenosis.   5. The inferior vena cava is normal in size with greater than 50%  respiratory variability, suggesting right atrial pressure of 3 mmHg.   Nuclear Stress Test 02/12/22 Findings are consistent with ischemia and prior myocardial infarction. The study is intermediate risk.   LV perfusion is abnormal. There is evidence of ischemia. There is evidence of infarction. Defect 1: There is a large defect with absent uptake present in the mid to basal inferior and inferoseptal location(s) that is fixed. There is abnormal wall motion in the defect area. Consistent with infarction in RCA distribution. Defect 2: There is a small defect with mild reduction in uptake present in the basal anterior location(s) that is reversible. There is normal wall motion in the defect area. Consistent with ischemia.   Left ventricular function is abnormal. The left ventricular ejection fraction is moderately decreased (30-44%). End diastolic cavity size is mildly enlarged. End systolic cavity size is mildly enlarged.  Patient Profile     71 y.o. male with a hx of CAD s/p CABG and PCI who is being seen 04/20/2022 for the evaluation of CHF   Assessment & Plan    Acute on Chronic combined systolic and diastolic Heart Failure - Most recent echocardiogram from 01/2022 showed EF 30-35% with regional wall motion abnormalities, grade I diastolic  dysfunction, moderately reduced RV function  - Patient presented complaining of progressive dyspnea, orthopnea, and PND. CXR without acute cardiopulmonary process. BNP elevated to 789.5 - Although chest x-ray is clear, patient does have typical symptoms of CHF exacerbation. He was recently diagnosed with Adrenal insufficiency and started on steroids, which could surely contribute to fluid retention  - So far, he received 1 dose of IV lasix 40 mg-- output 1 L urine yesterday. Renal function stable  - Increased lasix to 80 mg IV BID. Continue strict I/Os, daily weights, daily BMP to follow renal function and electrolytes  - Repeat echo pending today - Continue carvedilol 3.125 mg BID, spironolactone 12.5 mg daily - BP soft, limiting further GDMT at this time. Patient was unable  to afford farxiga in the past and was not interested in patient assistance   Elevated Troponin -- Likely type 2 NSTEMi  CAD s/p CABG  - hsTn 831>723 this admission. Patient without chest pain  - Suspect trop elevation is demand ischemia in the setting of CHF exacerbation. No need for IV heparin  - Left heart cath in 04/2021 showed diffuse underlying native vessel CAD and high-grade proximal LAD lesion that perfused a large septa branch. [place]-LAD did not backfill. There was failed PCI due to inability to cross the lesion, suspected CTO. Moderate in-stent restenosis of the proximal left circumflex with RFR of 0.92. PCI deferred.  - He did have a nuclear stress test on 02/12/22 that showed evidence of ischemia, prior myocardial infarction  - Echo pending this admission. Further recs pending results  - Continue DAPT with ASA, plavix. Continue carvedilol, crestor   AAA  - CTA chest/abd/pelvis from 01/2022 showed a 4.4 x 4.5 cm (from 3.6 cm in 2016) infrarenal abdominal aorta Aneurysm - Followed by vascular surgery   For questions or updates, please contact [place] Please consult [email or web address] for contact info  under        Signed, Margie Billet, PA-C  04/21/2022, 8:37 AM

## 2022-04-21 NOTE — Plan of Care (Signed)
  Problem: Health Behavior/Discharge Planning: Goal: Ability to manage health-related needs will improve Outcome: Progressing   Problem: Clinical Measurements: Goal: Ability to maintain clinical measurements within normal limits will improve Outcome: Progressing Goal: Will remain free from infection Outcome: Progressing   

## 2022-04-21 NOTE — ED Notes (Signed)
Pt transported to vascular.  °

## 2022-04-21 NOTE — ED Notes (Signed)
Hospitalist at bedside assessing pt at this time.  

## 2022-04-21 NOTE — ED Notes (Signed)
Patient is resting comfortably. 

## 2022-04-21 NOTE — Progress Notes (Signed)
Echocardiogram 2D Echocardiogram has been performed.  Oneal Deputy Prathik Aman RDCS 04/21/2022, 10:49 AM

## 2022-04-21 NOTE — Progress Notes (Signed)
Pt complained of dyspnea.  O2 sat 100% on room air.  Moved to chair because he wanted to sit up.Marland Kitchen  He said that he feels better in the chair.  Notified Dr. Tyrell Antonio.

## 2022-04-21 NOTE — Progress Notes (Signed)
PROGRESS NOTE    [name]  DSK:[id number] DOB: [birth year] DOA: 04/20/2022 PCP: Venia Carbon, MD   Brief Narrative: 71 year old with past medical history significant for CAD status post CABG and stenting, chronic heart failure reduced ejection fraction 30 to 35%, ischemic cardiomyopathy, Addison's  disease recently diagnosed, also hypertension, AAA, PVD presents complaining of worsening shortness of breath.  Patient recently diagnosed with Addison's disease and started on fludrocortisone and hydrocortisone 3 weeks prior to admission.  Since started on treatment for Addison's disease his appetite has improved his blood pressure has increased.  Evaluation in the ED chest x-ray showed pulmonary congestion, troponin 831.  Neurology has been consulted.  Patient admitted with heart failure exacerbation   Assessment & Plan:   Principal Problem:   CHF (congestive heart failure) (HCC) Active Problems:   Essential hypertension   Chronic combined systolic and diastolic heart failure (HCC)   Adrenal insufficiency (HCC)   Addison disease ([place])   NSTEMI (non-ST elevated myocardial infarction) (HCC)   Congestive heart failure (HCC)   Acute on chronic HFrEF (heart failure with reduced ejection fraction) (HCC)   1-Acute on Chronic Systolic Heart Failure Exacerbation: Ejection fraction 30 to 35% Patient presented with shortness of breath. Received 40 mg of IV Lasix yesterday.  Cardiology order 80 mg of IV Lasix for today. Cardiology consulted and following plan for right heart cath tomorrow. Started on digoxin  Elevation of troponin: Cardiology  following In setting HF exacerbation.   Addison's diseases:  Discussed with cardiology, due to borderline Low BP plan to start Stress dose Hydrocortisone.  Continue with fludrocortisone.   Frequent PVCs; IV magnesium order.  On coreg. Cardiology holding coreg due to soft BP>    HTN: BP soft.  Stress dose steroids.   Mild  hyponatremia. Monitor.       Estimated body mass index is 30.61 kg/m as calculated from the following:   Height as of 04/09/22: '5\' 3"'$  (1.6 m).   Weight as of 04/09/22: 78.4 kg.   DVT prophylaxis: Lovenox Code Status: Full code Family Communication: care discussed with patient.  Disposition Plan:  Status is: Observation The patient will require care spanning > 2 midnights and should be moved to inpatient because: management of HF exacerbation.     Consultants:  Cardiology   Procedures:  ECHO  Antimicrobials:    Subjective: He is still having SOB, specially if he lys down. He is not breathing at baseline.    Objective: Vitals:   04/21/22 0339 04/21/22 0400 04/21/22 0500 04/21/22 0600  BP:  99/75 113/76 124/62  Pulse:  65 62 61  Resp:  20 19 (!) 21  Temp: 98.1 F (36.7 C)     TempSrc: Oral     SpO2:  94% 97% 98%    Intake/Output Summary (Last 24 hours) at 04/21/2022 0719 Last data filed at 04/21/2022 0600 Gross per 24 hour  Intake --  Output 1125 ml  Net -1125 ml   There were no vitals filed for this visit.  Examination:  General exam: Appears calm and comfortable  Respiratory system: BL crackles.  Cardiovascular system: S1 & S2 heard, RRR. Gastrointestinal system: Abdomen is nondistended, soft and nontender. No organomegaly or masses felt. Normal bowel sounds heard. Central nervous system: Alert and oriented.  Extremities: Symmetric 5 x 5 power.   Data Reviewed: I have personally reviewed following labs and imaging studies  CBC: Recent Labs  Lab 04/20/22 0959  WBC 8.0  NEUTROABS 4.9  HGB 10.6*  HCT 34.1*  MCV 80.4  PLT 179   Basic Metabolic Panel: Recent Labs  Lab 04/20/22 0959 04/21/22 0335  NA 133* 133*  K 3.6 3.9  CL 101 101  CO2 25 24  GLUCOSE 95 98  BUN 10 13  CREATININE 0.88 1.00  CALCIUM 8.8* 9.0   GFR: Estimated Creatinine Clearance: 62.8 mL/min (by C-G formula based on SCr of 1 mg/dL). Liver Function Tests: No  results for input(s): "AST", "ALT", "ALKPHOS", "BILITOT", "PROT", "ALBUMIN" in the last 168 hours. No results for input(s): "LIPASE", "AMYLASE" in the last 168 hours. No results for input(s): "AMMONIA" in the last 168 hours. Coagulation Profile: No results for input(s): "INR", "PROTIME" in the last 168 hours. Cardiac Enzymes: No results for input(s): "CKTOTAL", "CKMB", "CKMBINDEX", "TROPONINI" in the last 168 hours. BNP (last 3 results) No results for input(s): "PROBNP" in the last 8760 hours. HbA1C: No results for input(s): "HGBA1C" in the last 72 hours. CBG: No results for input(s): "GLUCAP" in the last 168 hours. Lipid Profile: No results for input(s): "CHOL", "HDL", "LDLCALC", "TRIG", "CHOLHDL", "LDLDIRECT" in the last 72 hours. Thyroid Function Tests: No results for input(s): "TSH", "T4TOTAL", "FREET4", "T3FREE", "THYROIDAB" in the last 72 hours. Anemia Panel: No results for input(s): "VITAMINB12", "FOLATE", "FERRITIN", "TIBC", "IRON", "RETICCTPCT" in the last 72 hours. Sepsis Labs: No results for input(s): "PROCALCITON", "LATICACIDVEN" in the last 168 hours.  Recent Results (from the past 240 hour(s))  Resp panel by RT-PCR (RSV, Flu A&B, Covid) Anterior Nasal Swab     Status: None   Collection Time: 04/20/22 12:12 PM   Specimen: Anterior Nasal Swab  Result Value Ref Range Status   SARS Coronavirus 2 by RT PCR NEGATIVE NEGATIVE Final    Comment: (NOTE) SARS-CoV-2 target nucleic acids are NOT DETECTED.  The SARS-CoV-2 RNA is generally detectable in upper respiratory specimens during the acute phase of infection. The lowest concentration of SARS-CoV-2 viral copies this assay can detect is 138 copies/mL. A negative result does not preclude SARS-Cov-2 infection and should not be used as the sole basis for treatment or other patient management decisions. A negative result may occur with  improper specimen collection/handling, submission of specimen other than nasopharyngeal  swab, presence of viral mutation(s) within the areas targeted by this assay, and inadequate number of viral copies(<138 copies/mL). A negative result must be combined with clinical observations, patient history, and epidemiological information. The expected result is Negative.  Fact Sheet for Patients:  EntrepreneurPulse.com.au  Fact Sheet for Healthcare Providers:  IncredibleEmployment.be  This test is no t yet approved or cleared by the [place] FDA and  has been authorized for detection and/or diagnosis of SARS-CoV-2 by FDA under an Emergency Use Authorization (EUA). This EUA will remain  in effect (meaning this test can be used) for the duration of the COVID-19 declaration under Section 564(b)(1) of the Act, 21 U.S.C.section 360bbb-3(b)(1), unless the authorization is terminated  or revoked sooner.       Influenza A by PCR NEGATIVE NEGATIVE Final   Influenza B by PCR NEGATIVE NEGATIVE Final    Comment: (NOTE) The Xpert Xpress SARS-CoV-2/FLU/RSV plus assay is intended as an aid in the diagnosis of influenza from Nasopharyngeal swab specimens and should not be used as a sole basis for treatment. Nasal washings and aspirates are unacceptable for Xpert Xpress SARS-CoV-2/FLU/RSV testing.  Fact Sheet for Patients: EntrepreneurPulse.com.au  Fact Sheet for Healthcare Providers: IncredibleEmployment.be  This test is not yet approved or cleared by the [place] and  has been authorized for detection and/or diagnosis of SARS-CoV-2 by FDA under an Emergency Use Authorization (EUA). This EUA will remain in effect (meaning this test can be used) for the duration of the COVID-19 declaration under Section 564(b)(1) of the Act, 21 U.S.C. section 360bbb-3(b)(1), unless the authorization is terminated or revoked.     Resp Syncytial Virus by PCR NEGATIVE NEGATIVE Final    Comment: (NOTE) Fact Sheet for  Patients: EntrepreneurPulse.com.au  Fact Sheet for Healthcare Providers: IncredibleEmployment.be  This test is not yet approved or cleared by the [place] FDA and has been authorized for detection and/or diagnosis of SARS-CoV-2 by FDA under an Emergency Use Authorization (EUA). This EUA will remain in effect (meaning this test can be used) for the duration of the COVID-19 declaration under Section 564(b)(1) of the Act, 21 U.S.C. section 360bbb-3(b)(1), unless the authorization is terminated or revoked.  Performed at [facility], [place] [address] [place], [place] [address]          Radiology Studies: DG Chest 2 View  Result Date: 04/20/2022 CLINICAL DATA:  Reason for Exam: SOB. Per Triage Notes: Pt reports worsening SOB over the past few weeks. SOB worse with laying flat or exertion. Denies any CP. shortness of breath EXAM: CHEST - 2 VIEW COMPARISON:  None Available. FINDINGS: Sternotomy wires overlie normal cardiac silhouette. Cardiac stent noted. No effusion, infiltrate, or pneumothorax. Atherosclerotic calcification of the aorta. IMPRESSION: No acute cardiopulmonary process. Aortic Atherosclerosis (ICD10-I70.0). Electronically Signed   By: Suzy Bouchard M.D.   On: 04/20/2022 10:15        Scheduled Meds:  aspirin EC  81 mg Oral Daily   carvedilol  6.25 mg Oral BID   clopidogrel  75 mg Oral Daily   cyanocobalamin  1,000 mcg Oral Daily   enoxaparin (LOVENOX) injection  40 mg Subcutaneous Q24H   ezetimibe  10 mg Oral Daily   fludrocortisone  0.1 mg Oral Daily   furosemide  80 mg Intravenous BID   gabapentin  100 mg Oral BID   hydrocortisone  10 mg Oral Daily   mirtazapine  15 mg Oral QHS   pantoprazole  40 mg Oral BID   ranolazine  500 mg Oral BID   rosuvastatin  40 mg Oral Daily   sodium chloride flush  3 mL Intravenous Q12H   spironolactone  12.5 mg Oral Daily   Continuous Infusions:  sodium chloride       LOS: 0  days    Time spent: 35 minutes.     Elmarie Shiley, MD [facility]   If 7PM-7AM, please contact night-coverage [email or web address]  04/21/2022, 7:19 AM

## 2022-04-21 NOTE — Progress Notes (Addendum)
Rounding Note    Patient Name: [name] Date of Encounter: 04/21/2022  [place] Cardiologist: Ida Rogue, MD   Subjective   Patient reports that his breathing has not improved yet. Continues to have orthopnea, occasional SOB. Denies chest pain, palpitations, ankle edema   Inpatient Medications    Scheduled Meds:  aspirin EC  81 mg Oral Daily   carvedilol  6.25 mg Oral BID   clopidogrel  75 mg Oral Daily   cyanocobalamin  1,000 mcg Oral Daily   enoxaparin (LOVENOX) injection  40 mg Subcutaneous Q24H   ezetimibe  10 mg Oral Daily   fludrocortisone  0.1 mg Oral Daily   furosemide  80 mg Intravenous BID   gabapentin  100 mg Oral BID   hydrocortisone  10 mg Oral Daily   mirtazapine  15 mg Oral QHS   pantoprazole  40 mg Oral BID   ranolazine  500 mg Oral BID   rosuvastatin  40 mg Oral Daily   sodium chloride flush  3 mL Intravenous Q12H   spironolactone  12.5 mg Oral Daily   Continuous Infusions:  sodium chloride     PRN Meds: sodium chloride, acetaminophen, ondansetron (ZOFRAN) IV, sodium chloride flush   Vital Signs    Vitals:   04/21/22 0339 04/21/22 0400 04/21/22 0500 04/21/22 0600  BP:  99/75 113/76 124/62  Pulse:  65 62 61  Resp:  20 19 (!) 21  Temp: 98.1 F (36.7 C)     TempSrc: Oral     SpO2:  94% 97% 98%    Intake/Output Summary (Last 24 hours) at 04/21/2022 0837 Last data filed at 04/21/2022 0600 Gross per 24 hour  Intake --  Output 1125 ml  Net -1125 ml      04/09/2022   10:24 AM 03/25/2022    1:07 PM 03/08/2022    8:48 AM  Last 3 Weights  Weight (lbs) 172 lb 12.8 oz 167 lb 6.4 oz 169 lb 6.4 oz  Weight (kg) 78.382 kg 75.932 kg 76.839 kg      Telemetry    Sinus rhythm with frequent PVCs, occasional 3 beat runs of NSVT - Personally Reviewed  ECG    Sinus rhythm with PVCs, HR 70 BPM - Personally Reviewed  Physical Exam   GEN: No acute distress.  Sitting upright on the side of the bed eating breakfast  Neck:  No JVD Cardiac: RRR, no murmurs, rubs, or gallops.  Respiratory: Clear to auscultation bilaterally. GI: Soft, nontender, non-distended  MS: No edema; No deformity. Neuro:  Nonfocal  Psych: Normal affect   Labs    High Sensitivity Troponin:   Recent Labs  Lab 04/20/22 0959 04/20/22 1411  TROPONINIHS 831* 723*     Chemistry Recent Labs  Lab 04/20/22 0959 04/21/22 0335  NA 133* 133*  K 3.6 3.9  CL 101 101  CO2 25 24  GLUCOSE 95 98  BUN 10 13  CREATININE 0.88 1.00  CALCIUM 8.8* 9.0  GFRNONAA >60 >60  ANIONGAP 7 8    Lipids No results for input(s): "CHOL", "TRIG", "HDL", "LABVLDL", "LDLCALC", "CHOLHDL" in the last 168 hours.  Hematology Recent Labs  Lab 04/20/22 0959  WBC 8.0  RBC 4.24  HGB 10.6*  HCT 34.1*  MCV 80.4  MCH 25.0*  MCHC 31.1  RDW 19.6*  PLT 336   Thyroid No results for input(s): "TSH", "FREET4" in the last 168 hours.  BNP Recent Labs  Lab 04/20/22 0959  BNP 789.5*  DDimer No results for input(s): "DDIMER" in the last 168 hours.   Radiology    DG Chest 2 View  Result Date: 04/20/2022 CLINICAL DATA:  Reason for Exam: SOB. Per Triage Notes: Pt reports worsening SOB over the past few weeks. SOB worse with laying flat or exertion. Denies any CP. shortness of breath EXAM: CHEST - 2 VIEW COMPARISON:  None Available. FINDINGS: Sternotomy wires overlie normal cardiac silhouette. Cardiac stent noted. No effusion, infiltrate, or pneumothorax. Atherosclerotic calcification of the aorta. IMPRESSION: No acute cardiopulmonary process. Aortic Atherosclerosis (ICD10-I70.0). Electronically Signed   By: Suzy Bouchard M.D.   On: 04/20/2022 10:15    Cardiac Studies   Echocardiogram 01/25/22 1. Left ventricular ejection fraction, by estimation, is 30 to 35%. The  left ventricle has moderately decreased function. The left ventricle  demonstrates regional wall motion abnormalities (see scoring  diagram/findings for description). The left  ventricular  internal cavity size was mildly dilated. Left ventricular  diastolic parameters are consistent with Grade I diastolic dysfunction  (impaired relaxation).   2. Right ventricular systolic function is moderately reduced. The right  ventricular size is normal. Tricuspid regurgitation signal is inadequate  for assessing PA pressure.   3. The mitral valve is normal in structure. Trivial mitral valve  regurgitation.   4. The aortic valve is tricuspid. Aortic valve regurgitation is not  visualized. Aortic valve sclerosis/calcification is present, without any  evidence of aortic stenosis.   5. The inferior vena cava is normal in size with greater than 50%  respiratory variability, suggesting right atrial pressure of 3 mmHg.   Nuclear Stress Test 02/12/22 Findings are consistent with ischemia and prior myocardial infarction. The study is intermediate risk.   LV perfusion is abnormal. There is evidence of ischemia. There is evidence of infarction. Defect 1: There is a large defect with absent uptake present in the mid to basal inferior and inferoseptal location(s) that is fixed. There is abnormal wall motion in the defect area. Consistent with infarction in RCA distribution. Defect 2: There is a small defect with mild reduction in uptake present in the basal anterior location(s) that is reversible. There is normal wall motion in the defect area. Consistent with ischemia.   Left ventricular function is abnormal. The left ventricular ejection fraction is moderately decreased (30-44%). End diastolic cavity size is mildly enlarged. End systolic cavity size is mildly enlarged.  Patient Profile     71 y.o. male with a hx of CAD s/p CABG and PCI who is being seen 04/20/2022 for the evaluation of CHF   Assessment & Plan    Acute on Chronic combined systolic and diastolic Heart Failure - Most recent echocardiogram from 01/2022 showed EF 30-35% with regional wall motion abnormalities, grade I diastolic  dysfunction, moderately reduced RV function  - Patient presented complaining of progressive dyspnea, orthopnea, and PND. CXR without acute cardiopulmonary process. BNP elevated to 789.5 - Although chest x-ray is clear, patient does have typical symptoms of CHF exacerbation. He was recently diagnosed with Adrenal insufficiency and started on steroids, which could surely contribute to fluid retention  - So far, he received 1 dose of IV lasix 40 mg-- output 1 L urine yesterday. Renal function stable  - Increased lasix to 80 mg IV BID. Continue strict I/Os, daily weights, daily BMP to follow renal function and electrolytes  - Repeat echo pending today - Continue carvedilol 3.125 mg BID, spironolactone 12.5 mg daily - BP soft, limiting further GDMT at this time. Patient was unable  to afford farxiga in the past and was not interested in patient assistance   Elevated Troponin -- Likely type 2 NSTEMi  CAD s/p CABG  - hsTn 831>723 this admission. Patient without chest pain  - Suspect trop elevation is demand ischemia in the setting of CHF exacerbation. No need for IV heparin  - Left heart cath in 04/2021 showed diffuse underlying native vessel CAD and high-grade proximal LAD lesion that perfused a large septa branch. [place]-LAD did not backfill. There was failed PCI due to inability to cross the lesion, suspected CTO. Moderate in-stent restenosis of the proximal left circumflex with RFR of 0.92. PCI deferred.  - He did have a nuclear stress test on 02/12/22 that showed evidence of ischemia, prior myocardial infarction  - Echo pending this admission. Further recs pending results  - Continue DAPT with ASA, plavix. 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

## 2022-04-22 ENCOUNTER — Encounter (HOSPITAL_COMMUNITY)
Admission: EM | Disposition: A | Discharge status: Home / Self Care | Payer: Self-pay | Source: Home / Self Care | Attending: Internal Medicine

## 2022-04-22 ENCOUNTER — Telehealth: Payer: Self-pay

## 2022-04-22 ENCOUNTER — Encounter (HOSPITAL_COMMUNITY): Payer: Self-pay | Admitting: Cardiovascular Disease

## 2022-04-22 DIAGNOSIS — I2489 Other forms of acute ischemic heart disease: Secondary | ICD-10-CM | POA: Diagnosis not present

## 2022-04-22 DIAGNOSIS — I5043 Acute on chronic combined systolic (congestive) and diastolic (congestive) heart failure: Secondary | ICD-10-CM | POA: Diagnosis not present

## 2022-04-22 DIAGNOSIS — I214 Non-ST elevation (NSTEMI) myocardial infarction: Secondary | ICD-10-CM | POA: Diagnosis not present

## 2022-04-22 DIAGNOSIS — I493 Ventricular premature depolarization: Secondary | ICD-10-CM | POA: Diagnosis not present

## 2022-04-22 DIAGNOSIS — E271 Primary adrenocortical insufficiency: Secondary | ICD-10-CM

## 2022-04-22 DIAGNOSIS — I509 Heart failure, unspecified: Secondary | ICD-10-CM | POA: Diagnosis not present

## 2022-04-22 DIAGNOSIS — I5021 Acute systolic (congestive) heart failure: Secondary | ICD-10-CM | POA: Diagnosis not present

## 2022-04-22 HISTORY — PX: RIGHT HEART CATH: CATH118263

## 2022-04-22 HISTORY — DX: Primary adrenocortical insufficiency: E27.1

## 2022-04-22 LAB — CBC
HCT: 34.3 % — ABNORMAL LOW (ref 39.0–52.0)
Hemoglobin: 10.9 g/dL — ABNORMAL LOW (ref 13.0–17.0)
MCH: 24.5 pg — ABNORMAL LOW (ref 26.0–34.0)
MCHC: 31.8 g/dL (ref 30.0–36.0)
MCV: 77.1 fL — ABNORMAL LOW (ref 80.0–100.0)
Platelets: 362 10*3/uL (ref 150–400)
RBC: 4.45 MIL/uL (ref 4.22–5.81)
RDW: 19.3 % — ABNORMAL HIGH (ref 11.5–15.5)
WBC: 6.9 10*3/uL (ref 4.0–10.5)
nRBC: 0 % (ref 0.0–0.2)

## 2022-04-22 LAB — POCT I-STAT EG7
Acid-Base Excess: 0 mmol/L (ref 0.0–2.0)
Acid-Base Excess: 1 mmol/L (ref 0.0–2.0)
Bicarbonate: 24 mmol/L (ref 20.0–28.0)
Bicarbonate: 25 mmol/L (ref 20.0–28.0)
Calcium, Ion: 1.15 mmol/L (ref 1.15–1.40)
Calcium, Ion: 1.22 mmol/L (ref 1.15–1.40)
HCT: 34 % — ABNORMAL LOW (ref 39.0–52.0)
HCT: 34 % — ABNORMAL LOW (ref 39.0–52.0)
Hemoglobin: 11.6 g/dL — ABNORMAL LOW (ref 13.0–17.0)
Hemoglobin: 11.6 g/dL — ABNORMAL LOW (ref 13.0–17.0)
O2 Saturation: 59 %
O2 Saturation: 62 %
Potassium: 4 mmol/L (ref 3.5–5.1)
Potassium: 4.1 mmol/L (ref 3.5–5.1)
Sodium: 133 mmol/L — ABNORMAL LOW (ref 135–145)
Sodium: 134 mmol/L — ABNORMAL LOW (ref 135–145)
TCO2: 25 mmol/L (ref 22–32)
TCO2: 26 mmol/L (ref 22–32)
pCO2, Ven: 36 mmHg — ABNORMAL LOW (ref 44–60)
pCO2, Ven: 36.1 mmHg — ABNORMAL LOW (ref 44–60)
pH, Ven: 7.431 — ABNORMAL HIGH (ref 7.25–7.43)
pH, Ven: 7.448 — ABNORMAL HIGH (ref 7.25–7.43)
pO2, Ven: 29 mmHg — CL (ref 32–45)
pO2, Ven: 31 mmHg — CL (ref 32–45)

## 2022-04-22 LAB — BASIC METABOLIC PANEL
Anion gap: 9 (ref 5–15)
BUN: 13 mg/dL (ref 8–23)
CO2: 23 mmol/L (ref 22–32)
Calcium: 8.9 mg/dL (ref 8.9–10.3)
Chloride: 100 mmol/L (ref 98–111)
Creatinine, Ser: 0.9 mg/dL (ref 0.61–1.24)
GFR, Estimated: >60 mL/min (ref >60)
Glucose, Bld: 135 mg/dL — ABNORMAL HIGH (ref 70–99)
Potassium: 3.9 mmol/L (ref 3.5–5.1)
Sodium: 132 mmol/L — ABNORMAL LOW (ref 135–145)

## 2022-04-22 SURGERY — RIGHT HEART CATH
Anesthesia: LOCAL

## 2022-04-22 MED ORDER — ACETAMINOPHEN 325 MG PO TABS: 650.0000 mg | ORAL_TABLET | ORAL | Status: DC | PRN

## 2022-04-22 MED ORDER — HEPARIN (PORCINE) IN NACL 1000-0.9 UT/500ML-% IV SOLN
INTRAVENOUS | Status: DC | PRN
  Administered 2022-04-22: 500 mL

## 2022-04-22 MED ORDER — HEPARIN (PORCINE) IN NACL 1000-0.9 UT/500ML-% IV SOLN
INTRAVENOUS | Status: AC
  Filled 2022-04-22: qty 500

## 2022-04-22 MED ORDER — FUROSEMIDE 40 MG PO TABS: 40.0000 mg | ORAL_TABLET | Freq: Every day | ORAL | 1 refills | Status: DC | PRN

## 2022-04-22 MED ORDER — SODIUM CHLORIDE 0.9 % IV SOLN: 250.0000 mL | INTRAVENOUS | Status: DC | PRN

## 2022-04-22 MED ORDER — LABETALOL HCL 5 MG/ML IV SOLN: 10.0000 mg | INTRAVENOUS | Status: AC | PRN

## 2022-04-22 MED ORDER — SODIUM CHLORIDE 0.9% FLUSH: 3.0000 mL | INTRAVENOUS | Status: DC | PRN

## 2022-04-22 MED ORDER — MORPHINE SULFATE (PF) 2 MG/ML IV SOLN: 2.0000 mg | INTRAVENOUS | Status: DC | PRN

## 2022-04-22 MED ORDER — LIDOCAINE HCL (PF) 1 % IJ SOLN
INTRAMUSCULAR | Status: AC
  Filled 2022-04-22: qty 30

## 2022-04-22 MED ORDER — HYDRALAZINE HCL 20 MG/ML IJ SOLN: 10.0000 mg | INTRAMUSCULAR | Status: AC | PRN

## 2022-04-22 MED ORDER — SPIRONOLACTONE 12.5 MG HALF TABLET
12.5000 mg | ORAL_TABLET | Freq: Every day | ORAL | Status: DC
  Administered 2022-04-22: 12.5 mg via ORAL
  Filled 2022-04-22: qty 1

## 2022-04-22 MED ORDER — SODIUM CHLORIDE 0.9 % IV SOLN: INTRAVENOUS | Status: AC

## 2022-04-22 MED ORDER — HYDROCORTISONE 10 MG PO TABS
10.0000 mg | ORAL_TABLET | Freq: Two times a day (BID) | ORAL | Status: DC
  Filled 2022-04-22: qty 1

## 2022-04-22 MED ORDER — HYDROCORTISONE SOD SUC (PF) 100 MG IJ SOLR
100.0000 mg | Freq: Two times a day (BID) | INTRAMUSCULAR | Status: DC
  Administered 2022-04-22: 100 mg via INTRAVENOUS
  Filled 2022-04-22: qty 2

## 2022-04-22 MED ORDER — SODIUM CHLORIDE 0.9% FLUSH
3.0000 mL | Freq: Two times a day (BID) | INTRAVENOUS | Status: DC
  Administered 2022-04-22: 3 mL via INTRAVENOUS

## 2022-04-22 MED ORDER — ONDANSETRON HCL 4 MG/2ML IJ SOLN: 4.0000 mg | Freq: Four times a day (QID) | INTRAMUSCULAR | Status: DC | PRN

## 2022-04-22 MED ORDER — LIDOCAINE HCL (PF) 1 % IJ SOLN
INTRAMUSCULAR | Status: DC | PRN
  Administered 2022-04-22: 2 mL

## 2022-04-22 SURGICAL SUPPLY — 8 items
CATH SWAN GANZ 7F STRAIGHT (CATHETERS) IMPLANT
GLIDESHEATH SLENDER 7FR .021G (SHEATH) IMPLANT
PACK CARDIAC CATHETERIZATION (CUSTOM PROCEDURE TRAY) IMPLANT
PROTECTION STATION PRESSURIZED (MISCELLANEOUS) ×1
STATION PROTECTION PRESSURIZED (MISCELLANEOUS) IMPLANT
TRANSDUCER W/MONITORING KIT (MISCELLANEOUS) IMPLANT
TUBING ART PRESS 72  MALE/FEM (TUBING) ×1
TUBING ART PRESS 72 MALE/FEM (TUBING) IMPLANT

## 2022-04-22 NOTE — Discharge Summary (Addendum)
Physician Discharge Summary   Patient: [name] MRN: [id number] DOB: 08/30/1950  Admit date:     04/20/2022  Discharge date: 04/22/22  Discharge Physician: [name]   PCP: [name] Carbon, MD   Recommendations at discharge:   Follow up with cardiology for further care of heart failure.   Discharge Diagnoses: Principal Problem:   Acute on chronic HFrEF (heart failure with reduced ejection fraction) (HCC) Active Problems:   Essential hypertension   Chronic combined systolic and diastolic heart failure (HCC)   Adrenal insufficiency (HCC)   Addison disease (HCC)   NSTEMI (non-ST elevated myocardial infarction) ([place])   Resolved Problems:   * No resolved hospital problems. *  Hospital Course: 71 year old with past medical history significant for CAD status post CABG and stenting, chronic heart failure reduced ejection fraction 30 to 35%, ischemic cardiomyopathy, Addison's  disease recently diagnosed, also hypertension, AAA, PVD presents complaining of worsening shortness of breath.  Patient recently diagnosed with Addison's disease and started on fludrocortisone and hydrocortisone 3 weeks prior to admission.  Since started on treatment for Addison's disease his appetite has improved his blood pressure has increased.   Evaluation in the ED chest x-ray showed pulmonary congestion, troponin 831. Cardiology  has been consulted.  Patient admitted with heart failure exacerbation.  Assessment and Plan:  1-Acute on Chronic Systolic Heart Failure Exacerbation: Ejection fraction 30 to 35% Patient presented with shortness of breath. Received 40 mg of IV Lasix in the ED. 80 mg IV lasix 12/27. Cardiology consulted and underwent right side heart cath 12/28 which normal pressure.  He will be discharge on Lasix PRN. Resume spironolactone. Coreg home dose.  He will work on diet, low sodium.  Correction, patient admitted with acute on chronic systolic HF> Elevation of troponin:  Type 2 MI Cardiology  following In setting HF exacerbation.    Addison's diseases:  Discussed with cardiology, due to borderline Low BP, received stress dose steroids. Resume Hydrocortisone home dose at discharge Continue with fludrocortisone.    Frequent PVCs; Received IV magnesium.  On coreg.   HTN: BP soft. BP improved.  Stress dose steroids.    Mild hyponatremia. Monitor.             Consultants: Cardiology  Procedures performed: Right side heart cath  Disposition: Home Diet recommendation:  Discharge Diet Orders (From admission, onward)     Start     Ordered   04/22/22 0000  Diet - low sodium heart healthy        04/22/22 1141           Cardiac diet DISCHARGE MEDICATION: Allergies as of 04/22/2022       Reactions   Metoprolol Tartrate Hives, Itching   Quinolones Other (See Comments)   Contraindicated with AAA   Metoclopramide Nausea Only        Medication List     TAKE these medications    aspirin 81 MG tablet Take 81 mg by mouth daily.   carvedilol 3.125 MG tablet Commonly known as: COREG Take 1 tablet (3.125 mg total) by mouth 2 (two) times daily.   clopidogrel 75 MG tablet Commonly known as: PLAVIX TAKE 1 TABLET BY MOUTH EVERY DAY   cyanocobalamin 1000 MCG tablet Commonly known as: VITAMIN B12 Take 1,000 mcg by mouth daily.   ezetimibe 10 MG tablet Commonly known as: ZETIA Take 1 tablet (10 mg total) by mouth daily.   fludrocortisone 0.1 MG tablet Commonly known as: FLORINEF Take 1 tablet (0.1  mg total) by mouth daily.   furosemide 40 MG tablet Commonly known as: Lasix Take 1 tablet (40 mg total) by mouth daily as needed. Take as need for fluid retention.  Take 1 tablet if your weight increase more than 2 pounds in 24 hours.   gabapentin 100 MG capsule Commonly known as: NEURONTIN Take 1 capsule (100 mg total) by mouth 2 (two) times daily.   hydrocortisone 10 MG tablet Commonly known as: CORTEF Take 1 tablet daily at 8  AM, 1 tablet daily at noon What changed:  how much to take how to take this when to take this additional instructions   mirtazapine 15 MG tablet Commonly known as: REMERON TAKE 1 TABLET BY MOUTH EVERYDAY AT BEDTIME What changed: See the new instructions.   nitroGLYCERIN 0.4 MG SL tablet Commonly known as: NITROSTAT PLACE 1 TABLET UNDER THE TONGUE EVERY 5 MINUTES AS NEEDED FOR CHEST PAIN. What changed: See the new instructions.   pantoprazole 40 MG tablet Commonly known as: PROTONIX Take 1 tablet (40 mg total) by mouth 2 (two) times daily.   ranolazine 500 MG 12 hr tablet Commonly known as: RANEXA Take 1 tablet (500 mg total) by mouth 2 (two) times daily.   rosuvastatin 40 MG tablet Commonly known as: CRESTOR TAKE 1 TABLET BY MOUTH EVERY DAY   spironolactone 25 MG tablet Commonly known as: ALDACTONE Take 0.5 tablets (12.5 mg total) by mouth daily.        Follow-up Information     [name] I, MD Follow up in 1 week(s).   Specialties: Internal Medicine, Pediatrics Contact information: [place] [address] [phone]                Discharge Exam: [name] Weights   04/21/22 1125 04/22/22 0357  Weight: 76.2 kg 76 kg   General; NAD  Condition at discharge: stable  The results of significant diagnostics from this hospitalization (including imaging, microbiology, ancillary and laboratory) are listed below for reference.   Imaging Studies: CARDIAC CATHETERIZATION  Result Date: 04/22/2022 Images from the original result were not included. [name] is a 71 y.o. male  [id number] LOCATION:  FACILITY: [facility] PHYSICIAN: [name], M.D. [birth year] DATE OF PROCEDURE:  04/22/2022 DATE OF DISCHARGE: RIGHT HEART CARDIAC CATHETERIZATION History obtained from chart review.71 y.o. male with a hx of CAD s/p CABG and PCI who is being seen 04/20/2022 for the evaluation of CHF patient's EF is in the 35% range.  He has been diuresed 3 L.   His BNP is in the mid 100 range.  He presents now for right heart cath to determine his hemodynamics and volume status. HEMODYNAMICS:  1: Right atrial pressure-9/7 2: Right ventricular pressure-28/3 3: Pulmonary artery pressure-30/8 4: Pulmonary wedge pressure-A-wave equals 17, V wave equals 15, mean equals 10 5: Cardiac output-5.2 L/min with an index of 2.9 L/min/m by Fick, 3.5 L/min with an index of 1.9 L/min/m by thermal dilution.   Patient admitted with heart failure.  He has systolic dysfunction with an EF in the 30 to 35% range.  His filling pressures are within normal range suggesting adequate diuresis.  Swan-Ganz catheter was removed.  The sheath was removed and a pressure bandage was applied.  The patient left lab in stable condition.  Dr. Audie Box,  the patient's attending cardiologist, was notified of these results. [name]. MD, [facility] 04/22/2022 8:13 AM    ECHOCARDIOGRAM LIMITED  Result Date: 04/21/2022    ECHOCARDIOGRAM LIMITED REPORT  Patient Name:   RAINEN VANROSSUM Date of Exam: 04/21/2022 Medical Rec #:  [id number]        Height:       63.0 in Accession #:    [id number]       Weight:       172.8 lb Date of Birth:  1950-09-30        BSA:          1.817 m Patient Age:    67 years         BP:           99/65 mmHg Patient Gender: M                HR:           65 bpm. Exam Location:  Inpatient Procedure: Limited Echo, Color Doppler, Cardiac Doppler and Intracardiac            Opacification Agent Indications:    E83.15 Acute systolic (congestive) heart failure  History:        Patient has prior history of Echocardiogram examinations, most                 recent 01/25/2022. CHF, CAD, COPD; Risk Factors:Hypertension and                 Dyslipidemia.  Sonographer:    Raquel Sarna Senior Referring Phys: [id number] [place] THOMAS O'NEAL  Sonographer Comments: Scanned sitting upright due to dyspnea. IMPRESSIONS  1. Limited TTE to assess LV function.  2. Left ventricular ejection fraction, by estimation, is 30  to 35%. The left ventricle has moderately decreased function. The left ventricle demonstrates regional wall motion abnormalities (see scoring diagram/findings for description). There is akinesis of all basal-to-mid septal, basal-to-mid inferior, and basal-to-mid inferolateral LV segments consistent with prior TTE in 01/2022.  3. RV is not well visualized on current study but based on limited view, RV systolic function appears severely reduced.  4. The mitral valve is grossly normal. Trivial mitral valve regurgitation.  5. The aortic valve is tricuspid. There is moderate calcification of the aortic valve. There is moderate thickening of the aortic valve.  6. The inferior vena cava is normal in size with greater than 50% respiratory variability, suggesting right atrial pressure of 3 mmHg. FINDINGS  Left Ventricle: There is akinesis of all basal-to-mid septal, basal-to-mid inferior, and basal-to-mid inferolateral LV segments. Left ventricular ejection fraction, by estimation, is 30 to 35%. The left ventricle has moderately decreased function. The left ventricle demonstrates regional wall motion abnormalities. Definity contrast agent was given IV to delineate the left ventricular endocardial borders. The left ventricular internal cavity size was normal in size. There is no left ventricular hypertrophy. Right Ventricle: RV is not well visualized on current study but based on limited view, RV systolic function appears severely reduced. Mitral Valve: The mitral valve is grossly normal. Trivial mitral valve regurgitation. Tricuspid Valve: The tricuspid valve is normal in structure. Tricuspid valve regurgitation is not demonstrated. Aortic Valve: The aortic valve is tricuspid. There is moderate calcification of the aortic valve. There is moderate thickening of the aortic valve. Venous: The inferior vena cava is normal in size with greater than 50% respiratory variability, suggesting right atrial pressure of 3 mmHg.  Additional Comments: Spectral Doppler performed. Color Doppler performed.  LEFT VENTRICLE PLAX 2D LVIDd:         5.20 cm LVIDs:         4.80 cm LV PW:  0.70 cm LV IVS:        0.60 cm LVOT diam:     2.00 cm LV SV:         40 LV SV Index:   22 LVOT Area:     3.14 cm  LV Volumes (MOD) LV vol d, MOD A2C: 92.8 ml LV vol d, MOD A4C: 155.0 ml LV vol s, MOD A2C: 53.4 ml LV vol s, MOD A4C: 87.0 ml LV SV MOD A2C:     39.4 ml LV SV MOD A4C:     155.0 ml LV SV MOD BP:      56.0 ml RIGHT VENTRICLE RV S prime:     5.55 cm/s TAPSE (M-mode): 1.3 cm LEFT ATRIUM         Index LA diam:    4.00 cm 2.20 cm/m  AORTIC VALVE LVOT Vmax:   88.60 cm/s LVOT Vmean:  58.900 cm/s LVOT VTI:    0.127 m  SHUNTS Systemic VTI:  0.13 m Systemic Diam: 2.00 cm Gwyndolyn Kaufman MD Electronically signed by Gwyndolyn Kaufman MD Signature Date/Time: 04/21/2022/12:49:30 PM    Final    DG Chest 2 View  Result Date: 04/20/2022 CLINICAL DATA:  Reason for Exam: SOB. Per Triage Notes: Pt reports worsening SOB over the past few weeks. SOB worse with laying flat or exertion. Denies any CP. shortness of breath EXAM: CHEST - 2 VIEW COMPARISON:  None Available. FINDINGS: Sternotomy wires overlie normal cardiac silhouette. Cardiac stent noted. No effusion, infiltrate, or pneumothorax. Atherosclerotic calcification of the aorta. IMPRESSION: No acute cardiopulmonary process. Aortic Atherosclerosis (ICD10-I70.0). Electronically Signed   By: Suzy Bouchard M.D.   On: 04/20/2022 10:15    Microbiology: Results for orders placed or performed during the hospital encounter of 04/20/22  Resp panel by RT-PCR (RSV, Flu A&B, Covid) Anterior Nasal Swab     Status: None   Collection Time: 04/20/22 12:12 PM   Specimen: Anterior Nasal Swab  Result Value Ref Range Status   SARS Coronavirus 2 by RT PCR NEGATIVE NEGATIVE Final    Comment: (NOTE) SARS-CoV-2 target nucleic acids are NOT DETECTED.  The SARS-CoV-2 RNA is generally detectable in upper  respiratory specimens during the acute phase of infection. The lowest concentration of SARS-CoV-2 viral copies this assay can detect is 138 copies/mL. A negative result does not preclude SARS-Cov-2 infection and should not be used as the sole basis for treatment or other patient management decisions. A negative result may occur with  improper specimen collection/handling, submission of specimen other than nasopharyngeal swab, presence of viral mutation(s) within the areas targeted by this assay, and inadequate number of viral copies(<138 copies/mL). A negative result must be combined with clinical observations, patient history, and epidemiological information. The expected result is Negative.  Fact Sheet for Patients:  EntrepreneurPulse.com.au  Fact Sheet for Healthcare Providers:  IncredibleEmployment.be  This test is no t yet approved or cleared by the [place] FDA and  has been authorized for detection and/or diagnosis of SARS-CoV-2 by FDA under an Emergency Use Authorization (EUA). This EUA will remain  in effect (meaning this test can be used) for the duration of the COVID-19 declaration under Section 564(b)(1) of the Act, 21 U.S.C.section 360bbb-3(b)(1), unless the authorization is terminated  or revoked sooner.       Influenza A by PCR NEGATIVE NEGATIVE Final   Influenza B by PCR NEGATIVE NEGATIVE Final    Comment: (NOTE) The Xpert Xpress SARS-CoV-2/FLU/RSV plus assay is intended as an aid in the  diagnosis of influenza from Nasopharyngeal swab specimens and should not be used as a sole basis for treatment. Nasal washings and aspirates are unacceptable for Xpert Xpress SARS-CoV-2/FLU/RSV testing.  Fact Sheet for Patients: EntrepreneurPulse.com.au  Fact Sheet for Healthcare Providers: IncredibleEmployment.be  This test is not yet approved or cleared by the [place] FDA and has been  authorized for detection and/or diagnosis of SARS-CoV-2 by FDA under an Emergency Use Authorization (EUA). This EUA will remain in effect (meaning this test can be used) for the duration of the COVID-19 declaration under Section 564(b)(1) of the Act, 21 U.S.C. section 360bbb-3(b)(1), unless the authorization is terminated or revoked.     Resp Syncytial Virus by PCR NEGATIVE NEGATIVE Final    Comment: (NOTE) Fact Sheet for Patients: EntrepreneurPulse.com.au  Fact Sheet for Healthcare Providers: IncredibleEmployment.be  This test is not yet approved or cleared by the [place] FDA and has been authorized for detection and/or diagnosis of SARS-CoV-2 by FDA under an Emergency Use Authorization (EUA). This EUA will remain in effect (meaning this test can be used) for the duration of the COVID-19 declaration under Section 564(b)(1) of the Act, 21 U.S.C. section 360bbb-3(b)(1), unless the authorization is terminated or revoked.  Performed at [facility], [place] [address] [place], [place] 08/12/2022     Labs: CBC: Recent Labs  Lab 04/20/22 0959 04/22/22 0730  WBC 8.0 6.9  NEUTROABS 4.9  --   HGB 10.6* 10.9*  HCT 34.1* 34.3*  MCV 80.4 77.1*  PLT 336 401   Basic Metabolic Panel: Recent Labs  Lab 04/20/22 0959 04/21/22 0335 04/21/22 1221 04/22/22 0416  NA 133* 133*  --  132*  K 3.6 3.9  --  3.9  CL 101 101  --  100  CO2 25 24  --  23  GLUCOSE 95 98  --  135*  BUN 10 13  --  13  CREATININE 0.88 1.00  --  0.90  CALCIUM 8.8* 9.0  --  8.9  MG  --   --  2.7*  --    Liver Function Tests: No results for input(s): "AST", "ALT", "ALKPHOS", "BILITOT", "PROT", "ALBUMIN" in the last 168 hours. CBG: No results for input(s): "GLUCAP" in the last 168 hours.  Discharge time spent: greater than 30 minutes.  Signed: Elmarie Shiley, MD [facility] 04/22/2022

## 2022-04-22 NOTE — Progress Notes (Signed)
Cardiology Progress Note  Patient ID: SEANN GENTHER MRN: [id number] DOB: Dec 12, 1950 Date of Encounter: 04/22/2022  Primary Cardiologist: Ida Rogue, MD  Subjective   Chief Complaint: None.   HPI: Right heart catheterization with normal PA pressure and normal wedge pressure.  Breathing much improved.  Blood pressure stable.  ROS:  All other ROS reviewed and negative. Pertinent positives noted in the HPI.     Inpatient Medications  Scheduled Meds:  aspirin EC  81 mg Oral Daily   clopidogrel  75 mg Oral Daily   cyanocobalamin  1,000 mcg Oral Daily   enoxaparin (LOVENOX) injection  40 mg Subcutaneous Q24H   ezetimibe  10 mg Oral Daily   fludrocortisone  0.1 mg Oral Daily   gabapentin  100 mg Oral BID   hydrocortisone sod succinate (SOLU-CORTEF) inj  100 mg Intravenous BID   mirtazapine  15 mg Oral QHS   pantoprazole  40 mg Oral BID   ranolazine  500 mg Oral BID   rosuvastatin  40 mg Oral Daily   sodium chloride flush  3 mL Intravenous Q12H   sodium chloride flush  3 mL Intravenous Q12H   Continuous Infusions:  sodium chloride     sodium chloride     PRN Meds: sodium chloride, acetaminophen, hydrALAZINE, labetalol, morphine injection, ondansetron (ZOFRAN) IV, sodium chloride flush   Vital Signs   Vitals:   04/22/22 0357 04/22/22 0705 04/22/22 0741 04/22/22 0821  BP: 126/73 137/80  131/69  Pulse: 76 69  65  Resp:  20  20  Temp: 98.2 F (36.8 C) 98.6 F (37 C)  97.8 F (36.6 C)  TempSrc: Oral Oral  Oral  SpO2: 99% 96% 95% 96%  Weight: 76 kg     Height:        Intake/Output Summary (Last 24 hours) at 04/22/2022 1049 Last data filed at 04/22/2022 0600 Gross per 24 hour  Intake 200.05 ml  Output 2050 ml  Net -1849.95 ml      04/22/2022    3:57 AM 04/21/2022   11:25 AM 04/09/2022   10:24 AM  Last 3 Weights  Weight (lbs) 167 lb 9.6 oz 168 lb 172 lb 12.8 oz  Weight (kg) 76.023 kg 76.204 kg 78.382 kg      Telemetry  Overnight telemetry shows sinus  rhythm in 70s with PVCs, which I personally reviewed.    Physical Exam   Vitals:   04/22/22 0357 04/22/22 0705 04/22/22 0741 04/22/22 0821  BP: 126/73 137/80  131/69  Pulse: 76 69  65  Resp:  20  20  Temp: 98.2 F (36.8 C) 98.6 F (37 C)  97.8 F (36.6 C)  TempSrc: Oral Oral  Oral  SpO2: 99% 96% 95% 96%  Weight: 76 kg     Height:        Intake/Output Summary (Last 24 hours) at 04/22/2022 1049 Last data filed at 04/22/2022 0600 Gross per 24 hour  Intake 200.05 ml  Output 2050 ml  Net -1849.95 ml       04/22/2022    3:57 AM 04/21/2022   11:25 AM 04/09/2022   10:24 AM  Last 3 Weights  Weight (lbs) 167 lb 9.6 oz 168 lb 172 lb 12.8 oz  Weight (kg) 76.023 kg 76.204 kg 78.382 kg    Body mass index is 29.69 kg/m.  General: Well nourished, well developed, in no acute distress Head: Atraumatic, normal size  Eyes: PEERLA, EOMI  Neck: Supple, no JVD Endocrine: No thryomegaly Cardiac: Normal  S1, S2; RRR; no murmurs, rubs, or gallops Lungs: Clear to auscultation bilaterally, no wheezing, rhonchi or rales  Abd: Soft, nontender, no hepatomegaly  Ext: No edema, pulses 2+ Musculoskeletal: No deformities, BUE and BLE strength normal and equal Skin: Warm and dry, no rashes   Neuro: Alert and oriented to person, place, time, and situation, CNII-XII grossly intact, no focal deficits  Psych: Normal mood and affect   Labs  High Sensitivity Troponin:   Recent Labs  Lab 04/20/22 0959 04/20/22 1411  TROPONINIHS 831* 723*     Cardiac EnzymesNo results for input(s): "TROPONINI" in the last 168 hours. No results for input(s): "TROPIPOC" in the last 168 hours.  Chemistry Recent Labs  Lab 04/20/22 0959 04/21/22 0335 04/22/22 0416  NA 133* 133* 132*  K 3.6 3.9 3.9  CL 101 101 100  CO2 '25 24 23  '$ GLUCOSE 95 98 135*  BUN '10 13 13  '$ CREATININE 0.88 1.00 0.90  CALCIUM 8.8* 9.0 8.9  GFRNONAA >60 >60 >60  ANIONGAP '7 8 9    '$ Hematology Recent Labs  Lab 04/20/22 0959 04/22/22 0730   WBC 8.0 6.9  RBC 4.24 4.45  HGB 10.6* 10.9*  HCT 34.1* 34.3*  MCV 80.4 77.1*  MCH 25.0* 24.5*  MCHC 31.1 31.8  RDW 19.6* 19.3*  PLT 336 362   BNP Recent Labs  Lab 04/20/22 0959  BNP 789.5*    DDimer No results for input(s): "DDIMER" in the last 168 hours.   Radiology  CARDIAC CATHETERIZATION  Result Date: 04/22/2022 Images from the original result were not included. SHAHAN STARKS is a 71 y.o. male  [id number] LOCATION:  FACILITY: [facility] PHYSICIAN: Quay Burow, M.D. [birth year] DATE OF PROCEDURE:  04/22/2022 DATE OF DISCHARGE: RIGHT HEART CARDIAC CATHETERIZATION History obtained from chart review.71 y.o. male with a hx of CAD s/p CABG and PCI who is being seen 04/20/2022 for the evaluation of CHF patient's EF is in the 35% range.  He has been diuresed 3 L.  His BNP is in the mid 100 range.  He presents now for right heart cath to determine his hemodynamics and volume status. HEMODYNAMICS:  1: Right atrial pressure-9/7 2: Right ventricular pressure-28/3 3: Pulmonary artery pressure-30/8 4: Pulmonary wedge pressure-A-wave equals 17, V wave equals 15, mean equals 10 5: Cardiac output-5.2 L/min with an index of 2.9 L/min/m by Fick, 3.5 L/min with an index of 1.9 L/min/m by thermal dilution.   Patient admitted with heart failure.  He has systolic dysfunction with an EF in the 30 to 35% range.  His filling pressures are within normal range suggesting adequate diuresis.  Swan-Ganz catheter was removed.  The sheath was removed and a pressure bandage was applied.  The patient left lab in stable condition.  Dr. Audie Box,  the patient's attending cardiologist, was notified of these results. Quay Burow. MD, [facility] 04/22/2022 8:13 AM    ECHOCARDIOGRAM LIMITED  Result Date: 04/21/2022    ECHOCARDIOGRAM LIMITED REPORT   Patient Name:   HARRIET BOLLEN Date of Exam: 04/21/2022 Medical Rec #:  [id number]        Height:       63.0 in Accession #:    [id number]       Weight:       172.8 lb Date of  Birth:  20-Jun-1950        BSA:          1.817 m Patient Age:    71 years  BP:           99/65 mmHg Patient Gender: M                HR:           65 bpm. Exam Location:  Inpatient Procedure: Limited Echo, Color Doppler, Cardiac Doppler and Intracardiac            Opacification Agent Indications:    L24.40 Acute systolic (congestive) heart failure  History:        Patient has prior history of Echocardiogram examinations, most                 recent 01/25/2022. CHF, CAD, COPD; Risk Factors:Hypertension and                 Dyslipidemia.  Sonographer:    Raquel Sarna Senior Referring Phys: [id number] Natoma THOMAS O'NEAL  Sonographer Comments: Scanned sitting upright due to dyspnea. IMPRESSIONS  1. Limited TTE to assess LV function.  2. Left ventricular ejection fraction, by estimation, is 30 to 35%. The left ventricle has moderately decreased function. The left ventricle demonstrates regional wall motion abnormalities (see scoring diagram/findings for description). There is akinesis of all basal-to-mid septal, basal-to-mid inferior, and basal-to-mid inferolateral LV segments consistent with prior TTE in 01/2022.  3. RV is not well visualized on current study but based on limited view, RV systolic function appears severely reduced.  4. The mitral valve is grossly normal. Trivial mitral valve regurgitation.  5. The aortic valve is tricuspid. There is moderate calcification of the aortic valve. There is moderate thickening of the aortic valve.  6. The inferior vena cava is normal in size with greater than 50% respiratory variability, suggesting right atrial pressure of 3 mmHg. FINDINGS  Left Ventricle: There is akinesis of all basal-to-mid septal, basal-to-mid inferior, and basal-to-mid inferolateral LV segments. Left ventricular ejection fraction, by estimation, is 30 to 35%. The left ventricle has moderately decreased function. The left ventricle demonstrates regional wall motion abnormalities. Definity contrast agent was  given IV to delineate the left ventricular endocardial borders. The left ventricular internal cavity size was normal in size. There is no left ventricular hypertrophy. Right Ventricle: RV is not well visualized on current study but based on limited view, RV systolic function appears severely reduced. Mitral Valve: The mitral valve is grossly normal. Trivial mitral valve regurgitation. Tricuspid Valve: The tricuspid valve is normal in structure. Tricuspid valve regurgitation is not demonstrated. Aortic Valve: The aortic valve is tricuspid. There is moderate calcification of the aortic valve. There is moderate thickening of the aortic valve. Venous: The inferior vena cava is normal in size with greater than 50% respiratory variability, suggesting right atrial pressure of 3 mmHg. Additional Comments: Spectral Doppler performed. Color Doppler performed.  LEFT VENTRICLE PLAX 2D LVIDd:         5.20 cm LVIDs:         4.80 cm LV PW:         0.70 cm LV IVS:        0.60 cm LVOT diam:     2.00 cm LV SV:         40 LV SV Index:   22 LVOT Area:     3.14 cm  LV Volumes (MOD) LV vol d, MOD A2C: 92.8 ml LV vol d, MOD A4C: 155.0 ml LV vol s, MOD A2C: 53.4 ml LV vol s, MOD A4C: 87.0 ml LV SV MOD A2C:     39.4 ml LV  SV MOD A4C:     155.0 ml LV SV MOD BP:      56.0 ml RIGHT VENTRICLE RV S prime:     5.55 cm/s TAPSE (M-mode): 1.3 cm LEFT ATRIUM         Index LA diam:    4.00 cm 2.20 cm/m  AORTIC VALVE LVOT Vmax:   88.60 cm/s LVOT Vmean:  58.900 cm/s LVOT VTI:    0.127 m  SHUNTS Systemic VTI:  0.13 m Systemic Diam: 2.00 cm Gwyndolyn Kaufman MD Electronically signed by Gwyndolyn Kaufman MD Signature Date/Time: 04/21/2022/12:49:30 PM    Final     Cardiac Studies   Right heart cath Right atrial pressure 7 PA pressure 30/8/15 Pulmonary capillary wedge pressure 10  TTE 04/21/2022  1. Limited TTE to assess LV function.   2. Left ventricular ejection fraction, by estimation, is 30 to 35%. The  left ventricle has moderately  decreased function. The left ventricle  demonstrates regional wall motion abnormalities (see scoring  diagram/findings for description). There is  akinesis of all basal-to-mid septal, basal-to-mid inferior, and  basal-to-mid inferolateral LV segments consistent with prior TTE in  01/2022.   3. RV is not well visualized on current study but based on limited view,  RV systolic function appears severely reduced.   4. The mitral valve is grossly normal. Trivial mitral valve  regurgitation.   5. The aortic valve is tricuspid. There is moderate calcification of the  aortic valve. There is moderate thickening of the aortic valve.   6. The inferior vena cava is normal in size with greater than 50%  respiratory variability, suggesting right atrial pressure of 3 mmHg.   Patient Profile  71 year old male with history of CAD status post CABG/PCI, ischemic cardiomyopathy, EF 30-35%, Addison's disease, hypertension, AAA admitted on 04/20/2022 for acute on chronic systolic heart failure.   Assessment & Plan   # Acute systolic heart failure, EF 30-35% -Right heart catheterization confirms that his volume status is normal.  Mean PA pressure 15.  Wedge pressure 10.  These are normal filling pressures. -No further IV diuresis needed.  Would recommend to continue home carvedilol 3.125 mg twice daily.  Add back Aldactone 12.5 mg daily.  He apparently did not tolerate losartan.  Will continue this current regimen. -He can be discharged with Lasix 40 mg daily as needed. -It appears he indulged in salty foods over Christmas and this may have led to a heart failure exacerbation.  # Acute on hypoxic respiratory failure -Secondary to congestive heart failure.  Now effectively diuresed.  Symptoms are much improved.  Can go back on oral regimen.  His mean PA pressure is 15.  I do not suspect a pulmonary embolism.  He is saturating in the high 90s on room air.  Again I believe this was congestive heart failure that is  improved dramatically.  #PVCs -Stable.  Secondary to cardiomyopathy.  Continue beta-blocker.  # Elevated troponin, demand ischemia -Secondary to congestive heart failure.  Does not represent an acute coronary syndrome.  He has quite severe coronary artery disease.  Continue home medications which include aspirin and Plavix.  He is on a statin.  # Addison's disease -Given stress steroids yesterday.  Symptoms are improved.  Continue management per hospital medicine team.  [facility] will sign off.   Medication Recommendations: Medical management as above. Other recommendations (labs, testing, etc): None. Follow up as an outpatient: He has follow-up with Dr. Rockey Situ in January.  For questions or updates,  please contact [place] Please consult [email or web address] for contact info under     Signed, [place] T. Audie Box, MD, [place]  04/22/2022 10:49 AM

## 2022-04-22 NOTE — Progress Notes (Signed)
Heart Failure Navigator Progress Note  Assessed for Heart & Vascular TOC clinic readiness.  Patient EF 30-35% HFpEF., Right Heart Cath 04/22/22 , no change to EF. Patient has a scheduled hospital follow up with [facility] on 05/10/2022. .   Navigator will sign off at this time.    Earnestine Leys, BSN, Clinical cytogeneticist Only

## 2022-04-22 NOTE — Interval H&P Note (Signed)
History and Physical Interval Note:  04/22/2022 7:40 AM  [name]  has presented today for surgery, with the diagnosis of chf.  The various methods of treatment have been discussed with the patient and family. After consideration of risks, benefits and other options for treatment, the patient has consented to  Procedure(s): RIGHT HEART CATH (N/A) as a surgical intervention.  The patient's history has been reviewed, patient examined, no change in status, stable for surgery.  I have reviewed the patient's chart and labs.  Questions were answered to the patient's satisfaction.     Quay Burow

## 2022-04-22 NOTE — Telephone Encounter (Signed)
Called and spoke to pt about recent hospital stay. Per Dr Silvio Pate, While he may want to check with the endocrinologist, given the fluid overload, I would recommend he stop the fludrocortisone and just continue the hydrocortisone.  I relayed that to the pt. He stated they were pretty adamant that her take both. They added furosemide if he gains 2#.   He will contact the endocrinologist. He also wanted to do a hospital follow-up so I scheduled him 04-28-22.

## 2022-04-23 ENCOUNTER — Telehealth: Payer: Self-pay | Admitting: *Deleted

## 2022-04-23 NOTE — Patient Outreach (Signed)
  Care Coordination [facility] Note Transition Care Management Follow-up Telephone Call Date of discharge and from where: [facility] [phone] How have you been since you were released from the hospital? Doing good Any questions or concerns? No  Items Reviewed: Did the pt receive and understand the discharge instructions provided? Yes  Medications obtained and verified? Yes  Other? No  Any new allergies since your discharge? No  Dietary orders reviewed? No Do you have support at home? Yes   Home Care and Equipment/Supplies: Were home health services ordered? no If so, what is the name of the agency? N/a  Has the agency set up a time to come to the patient's home? not applicable Were any new equipment or medical supplies ordered?  No What is the name of the medical supply agency? N/a Were you able to get the supplies/equipment? not applicable Do you have any questions related to the use of the equipment or supplies? No  Functional Questionnaire: (I = Independent and D = Dependent) ADLs: I  Bathing/Dressing- I  Meal Prep- I  Eating- I  Maintaining continence- I  Transferring/Ambulation- I  Managing Meds- I  Follow up appointments reviewed:  PCP Hospital f/u appt confirmed? Yes  Dr Silvio Pate [phone] [facility] f/u appt confirmed? Yes  Dr Rockey Situ [phone] 9:40 Are transportation arrangements needed? No  If their condition worsens, is the pt aware to call PCP or go to the Emergency Dept.? Yes Was the patient provided with contact information for the PCP's office or ED? Yes Was to pt encouraged to call back with questions or concerns? Yes  SDOH assessments and interventions completed:   Yes  SDOH Screenings   Food Insecurity: No Food Insecurity (04/23/2022)  Housing: Low Risk  (04/23/2022)  Transportation Needs: No Transportation Needs (04/23/2022)  Depression (PHQ2-9): Low Risk  (02/24/2022)  Tobacco Use: Medium Risk (04/22/2022)     Care Coordination Interventions:   Patient wanted to know what tests do they do for COPD. RN explained CXR, PFT            ( Spirometry test  )  Encounter Outcome:  Pt. Visit Completed    [facility] [phone]

## 2022-04-28 ENCOUNTER — Encounter: Payer: Self-pay | Admitting: Internal Medicine

## 2022-04-28 ENCOUNTER — Ambulatory Visit (INDEPENDENT_AMBULATORY_CARE_PROVIDER_SITE_OTHER): Payer: Medicare Other | Admitting: Internal Medicine

## 2022-04-28 VITALS — BP 136/80 | HR 84 | Temp 98.0°F | Ht 63.0 in | Wt 173.0 lb

## 2022-04-28 DIAGNOSIS — E2740 Unspecified adrenocortical insufficiency: Secondary | ICD-10-CM

## 2022-04-28 DIAGNOSIS — I5042 Chronic combined systolic (congestive) and diastolic (congestive) heart failure: Secondary | ICD-10-CM | POA: Diagnosis not present

## 2022-04-28 NOTE — Assessment & Plan Note (Signed)
Reviewed initial presentation---not classic Addison's----even had initial low potassium He is on hydrocortisone 10 bid Did get stress dose steroids for hospitalizations Will contact Dr Nida---I am not convinced he needs mineralocorticoid replacement (fludrocortisone). If he agrees, would stop this

## 2022-04-28 NOTE — Progress Notes (Signed)
Subjective:    Patient ID: [name], male    DOB: February 24, 1951, 72 y.o.   MRN: [id number]  HPI Here with wife for hospital follow up  Went to bed---woke up soon due to dyspnea Felt like he was going to die Sat up in chair and improved some---but couldn't lie back again (even with multiple pillows) Sat up the rest of the night---but still didn't improve in the AM Called here--sent to ER Admitted with CHF  Echo showed EF 30-35% Right heart cath showed normal pressures IV diuresis Did improve with breathing and sent home---6 days ago  Doing fine since then Still some DOE--but able to sleep in bed (now has wedge pillow that is fairly high) Mild edema--stable over a year Doing AM weights--- ~166# Told to take the furosemide for any 2# gain  Didn't notice any change in how he felt when fludrocortisone added (actually started with more DOE shortly after starting)  Current Outpatient Medications on File Prior to Visit  Medication Sig Dispense Refill   aspirin 81 MG tablet Take 81 mg by mouth daily.     carvedilol (COREG) 3.125 MG tablet Take 1 tablet (3.125 mg total) by mouth 2 (two) times daily. 180 tablet 0   clopidogrel (PLAVIX) 75 MG tablet TAKE 1 TABLET BY MOUTH EVERY DAY (Patient taking differently: Take 75 mg by mouth daily.) 90 tablet 2   cyanocobalamin (VITAMIN B12) 1000 MCG tablet Take 1,000 mcg by mouth daily.     ezetimibe (ZETIA) 10 MG tablet Take 1 tablet (10 mg total) by mouth daily. 90 tablet 1   fludrocortisone (FLORINEF) 0.1 MG tablet Take 1 tablet (0.1 mg total) by mouth daily. 90 tablet 1   furosemide (LASIX) 40 MG tablet Take 1 tablet (40 mg total) by mouth daily as needed. Take as need for fluid retention.  Take 1 tablet if your weight increase more than 2 pounds in 24 hours. 30 tablet 1   gabapentin (NEURONTIN) 100 MG capsule Take 1 capsule (100 mg total) by mouth 2 (two) times daily. 180 capsule 3   hydrocortisone (CORTEF) 10 MG tablet Take 1 tablet daily  at 8 AM, 1 tablet daily at noon (Patient taking differently: Take 10 mg by mouth 2 (two) times daily. Take 1 tablet (10 mg) by mouth daily at 8 AM and 1 tablet (10 mg) by mouth at noon 1200) 200 tablet 1   mirtazapine (REMERON) 15 MG tablet TAKE 1 TABLET BY MOUTH EVERYDAY AT BEDTIME (Patient taking differently: Take 15 mg by mouth at bedtime.) 90 tablet 3   nitroGLYCERIN (NITROSTAT) 0.4 MG SL tablet PLACE 1 TABLET UNDER THE TONGUE EVERY 5 MINUTES AS NEEDED FOR CHEST PAIN. (Patient taking differently: Place 0.4 mg under the tongue every 5 (five) minutes as needed for chest pain (call 911).) 25 tablet 2   pantoprazole (PROTONIX) 40 MG tablet Take 1 tablet (40 mg total) by mouth 2 (two) times daily. 180 tablet 3   ranolazine (RANEXA) 500 MG 12 hr tablet Take 1 tablet (500 mg total) by mouth 2 (two) times daily. 180 tablet 3   rosuvastatin (CRESTOR) 40 MG tablet TAKE 1 TABLET BY MOUTH EVERY DAY (Patient taking differently: Take 40 mg by mouth daily.) 90 tablet 3   spironolactone (ALDACTONE) 25 MG tablet Take 0.5 tablets (12.5 mg total) by mouth daily. 45 tablet 0   No current facility-administered medications on file prior to visit.    Allergies  Allergen Reactions   Metoprolol Tartrate Hives  and Itching   Quinolones Other (See Comments)    Contraindicated with AAA   Metoclopramide Nausea Only    Past Medical History:  Diagnosis Date   AAA (abdominal aortic aneurysm) ([place])    a. Duplex 01/2012: stable infrarenal saccular AAA at 3.3cm x 3.2xm (f/u recommended 01/2013 per Dr. Rockey Situ)   Alcohol abuse    CAD (coronary artery disease)    a. 1995 s/p CABG x 4: LIMA->LAD, VG->RI (known to be occluded), VG->AM->PDA;  b. 05/2002 Inf STEMI: VG->AM->PDA 100% treated w/ 2 BMS complicated by acute thrombosis req 3 BMS;  c. 07/2003 DES to  LAD & LCX (VG's to PDA & RI 100%);  d. 12/2010 Acute [place] ([place]): DES to LCX & LM , LIMA ok,;  e.12/2011 Cath: LM/LCX stents ok , LIMA patent.   Cardiomyopathy ([place])    a. EF 35%  by cath 2015.   Diverticulitis    1/06 Diverticulitis--CT of pelvis--diffuse sigmoid divertic   Dyspnea    GERD (gastroesophageal reflux disease)    Hyperlipidemia    Hypertension    Myocardial infarction [facility])    PAD (peripheral artery disease) ([place])    a. external iliac and mesenteric stenosis noted by noninvasive imaging.   Renal artery stenosis ([place])    a. 03/2011 PTA and stenting of L RA. b. last duplex 2016 with stable 1-59% bilateral RAS, incidental >50% R EIA stenosis    Past Surgical History:  Procedure Laterality Date   [place]     8/09  Cath--vein graft occlusions which are old--no acute changes   CARDIAC CATHETERIZATION  11/13/2010   stent x 2 @ [place]   CARDIAC CATHETERIZATION  04/05/2014   stent placement    CARDIAC CATHETERIZATION  10/28/2017   CLOSED REDUCTION SHOULDER DISLOCATION     COLONOSCOPY WITH PROPOFOL N/A 12/22/2018   Procedure: COLONOSCOPY WITH PROPOFOL;  Surgeon: Jonathon Bellows, MD;  Location: [facility] ENDOSCOPY;  Service: Gastroenterology;  Laterality: N/A;   CORONARY ANGIOPLASTY  04/05/2014   stent placement OM 1   CORONARY ARTERY BYPASS GRAFT     CORONARY STENT PLACEMENT  7/12   2 stents--Promus element plus (everolimus eluting)--[facility] in [place] WIRE/FFR STUDY N/A 05/13/2021   Procedure: INTRAVASCULAR PRESSURE WIRE/FFR STUDY;  Surgeon: Early Osmond, MD;  Location: [place] CV LAB;  Service: Cardiovascular;  Laterality: N/A;   LEFT HEART CATH AND CORONARY ANGIOGRAPHY N/A 05/13/2021   Procedure: LEFT HEART CATH AND CORONARY ANGIOGRAPHY;  Surgeon: Early Osmond, MD;  Location: [place] CV LAB;  Service: Cardiovascular;  Laterality: N/A;   LEFT HEART CATH AND CORS/GRAFTS ANGIOGRAPHY N/A 10/28/2017   Procedure: LEFT HEART CATH AND CORS/GRAFTS ANGIOGRAPHY;  Surgeon: Leonie Man, MD;  Location: [place] CV LAB;  Service: Cardiovascular;  Laterality: N/A;   LEFT HEART  CATHETERIZATION WITH CORONARY/GRAFT ANGIOGRAM N/A 01/04/2012   Procedure: LEFT HEART CATHETERIZATION WITH Beatrix Fetters;  Surgeon: Burnell Blanks, MD;  Location: [facility] CATH LAB;  Service: Cardiovascular;  Laterality: N/A;   LEFT HEART CATHETERIZATION WITH CORONARY/GRAFT ANGIOGRAM N/A 04/05/2014   Procedure: LEFT HEART CATHETERIZATION WITH Beatrix Fetters;  Surgeon: Jettie Booze, MD;  Location: [facility] CATH LAB;  Service: Cardiovascular;  Laterality: N/A;   PERCUTANEOUS CORONARY STENT INTERVENTION (PCI-S)  04/05/2014   Procedure: PERCUTANEOUS CORONARY STENT INTERVENTION (PCI-S);  Surgeon: Jettie Booze, MD;  Location: [facility] CATH LAB;  Service: Cardiovascular;;  OM1   RENAL ANGIOGRAM N/A 04/16/2011   Procedure:  RENAL ANGIOGRAM;  Surgeon: Burnell Blanks, MD;  Location: [facility] CATH LAB;  Service: Cardiovascular;  Laterality: N/A;   RENAL ARTERY STENT  03/2011 ?   RIGHT HEART CATH N/A 04/22/2022   Procedure: RIGHT HEART CATH;  Surgeon: Lorretta Harp, MD;  Location: [place] CV LAB;  Service: Cardiovascular;  Laterality: N/A;    Family History  Problem Relation Age of Onset   Coronary artery disease Mother        Died MI age 73   Hypertension Mother    Heart attack Mother    Coronary artery disease Sister        Living   Coronary artery disease Brother        Living   Coronary artery disease Father        Died MI age 30   Heart attack Father    Heart attack Maternal Grandfather    Diabetes Neg Hx    Cancer Neg Hx        prostate or colon    Social History   Socioeconomic History   Marital status: Married    Spouse name: Not on file   Number of children: 3   Years of education: Not on file   Highest education level: Not on file  Occupational History   Occupation: Geneticist, molecular (Animal nutritionist)    Comment: Retired  Tobacco Use   Smoking status: Former    Packs/day: 3.00    Years: 25.00    Total pack years: 75.00    Types:  Cigarettes    Quit date: 05/28/1993    Years since quitting: 28.9    Passive exposure: Past   Smokeless tobacco: Never  Vaping Use   Vaping Use: Never used  Substance and Sexual Activity   Alcohol use: Yes    Alcohol/week: 42.0 standard drinks of alcohol    Types: 42 Cans of beer per week   Drug use: No   Sexual activity: Not on file  Other Topics Concern   Not on file  Social History Narrative   Working on living will   No health care POA but requests wife--then children   Would accept resuscitation   Not sure about tube feeds   Social Determinants of Health   Financial Resource Strain: Not on file  Food Insecurity: No Food Insecurity (04/23/2022)   Hunger Vital Sign    Worried About Running Out of Food in the Last Year: Never true    Ran Out of Food in the Last Year: Never true  Transportation Needs: No Transportation Needs (04/23/2022)   PRAPARE - Hydrologist (Medical): No    Lack of Transportation (Non-Medical): No  Physical Activity: Not on file  Stress: Not on file  Social Connections: Not on file  Intimate Partner Violence: Not on file   Review of Systems Appetite is okay No chest pain    Objective:   Physical Exam Constitutional:      Appearance: Normal appearance.  Cardiovascular:     Rate and Rhythm: Normal rate and regular rhythm.     Heart sounds: No murmur heard.    No gallop.  Pulmonary:     Effort: Pulmonary effort is normal.     Breath sounds: Normal breath sounds. No wheezing or rales.  Musculoskeletal:     Cervical back: Neck supple.     Right lower leg: No edema.     Left lower leg: No edema.  Lymphadenopathy:  Cervical: No cervical adenopathy.  Neurological:     Mental Status: He is alert.            Assessment & Plan:

## 2022-04-28 NOTE — Assessment & Plan Note (Signed)
Fluid overload with acute failure requiring hospitalization and IV diuresis following initiation of fludrocortisone Now improved Weighing daily---furosemide '40mg'$  prn Still on carvedilol 3.125 bid and spironolactone 12.5 daily

## 2022-05-09 NOTE — Progress Notes (Unsigned)
Date:  05/10/2022   ID:  [name], DOB [birth year], MRN [id number]  Patient Location:  [address] [place] [address]   Provider location:   [facility], [place] office  PCP:  Venia Carbon, MD  Cardiologist:  Arvid Right [facility]   Chief Complaint  Patient presents with   2 month follow up     Patient c/o shortness of breath with over exertion. Medications reviewed by the patient verbally.     History of Present Illness:    [name] is a 72 y.o. male  past medical history of coronary artery disease, bypass surgery in 1995,  stenting at [facility] in February 2004 for MI with a 3.0 x 25 mm and 4.5 x 16 mm Monorail ( location uncertain), also 4.5 x 18 mm stent placed to the mid RCA, followup with repeat stenting in April 2005 to the proximal LAD with a Taxus stent 2.5 x 8 mm, and stenting to the proximal left circumflex with a 2.5 x 12 mm Taxus, with  stent placed November 13, 2010 with a 4.0 x 9 mm stent placed to the left main. renal artery stent.  smoker though stopped in 1995. Prior to that he smoked 3 packs per day. Smoked for approximately 25-28 years, 3 ppd, quit 1995 4.2 cm AAA on CT scan 10/2019 He presents for routine followup of his coronary artery disease.  Last seen in clinic by myself September 2023 Stress test October 2023 prior MI, predominantly fixed defect inferior, anteroseptal wall  Admission to the hospital April 20, 2022 non-STEMI, CHF Acute shortness of breath from home Ejection fraction 30 to 35%, hypokinesis inferior, inferolateral, septal wall Right heart catheterization after diuresis showing normal pressures Discharged on Lasix 40 as needed Felt to have indulged on salty foods over Christmas holiday "Cheese doodle kick at that time" BNP >700 TNT 700-800, no ischemic workup  Today, reports has not taken lasix more than once for weight gain Had some issues with sleeping, shortness of  breath, sleeping on a wedge  Does not eat out much, high bread intake Denies excessive fluid intake No regular exercise program, chronic leg weakness  Labs reviewed: A1C 6.8 LDL 50s, total chol 136  EKG personally reviewed by myself on todays visit Normal sinus rhythm with rate 66 bpm nonspecific ST abnormality, old inferior0 2 MI Unchanged from prior studies  Other past medical history reviewed Followed by vascular Known LE arterial disease on left, moderate  Other past medical hx reviewed NSTEMI: 04/2021, hospital records reviewed Cath: High-grade proximal LAD lesion which perfuses a large septal; the LIMA to the LAD does not backfill just septal.  Failed PCI due to inability to cross and likely represents a chronic total occlusion. 2.  Moderate in-stent restenosis of proximal left circumflex with RFR of 0.92; PCI was deferred. 3.  Sequential vein graft to OM to PDA, vein graft to OM,, and native right coronary artery were not imaged as they are known occluded.  Echo reviewed  Left ventricular ejection fraction, by estimation, is 45 to 50%  Carotid ultrasound October 2021 with 40 to 59% disease/stenosis bilaterally,  Cardiac cath 10/25/2017 No stents Known Severe Multi-vessel CAD: known CTO of ostRCA, mLAD & SVG-AM-PDA, SVG-OM. Widely patent LIMA-LAD - retograde fills to 100% CTO, antegrade flow brisk to the Apex. Prox LM ~40% (stable lesion just prior to the stent) followed by widely patent stents running from pLM-ost-proxCx-OM with brisk  flow in both the distal OM & AV Groove Cx --> collaterals to RPL system. Mldly reduced LVEF with basal-mid Inferior Akinesis. Normal/Low LVEDP.   Past Medical History:  Diagnosis Date   AAA (abdominal aortic aneurysm) ([place])    a. Duplex 01/2012: stable infrarenal saccular AAA at 3.3cm x 3.2xm (f/u recommended 01/2013 per Dr. Rockey Situ)   Alcohol abuse    CAD (coronary artery disease)    a. 1995 s/p CABG x 4: LIMA->LAD, VG->RI (known to be  occluded), VG->AM->PDA;  b. 05/2002 Inf STEMI: VG->AM->PDA 100% treated w/ 2 BMS complicated by acute thrombosis req 3 BMS;  c. 07/2003 DES to  LAD & LCX (VG's to PDA & RI 100%);  d. 12/2010 Acute [place] ([place]): DES to LCX & LM , LIMA ok,;  e.12/2011 Cath: LM/LCX stents ok , LIMA patent.   Cardiomyopathy ([place])    a. EF 35% by cath 2015.   Diverticulitis    1/06 Diverticulitis--CT of pelvis--diffuse sigmoid divertic   Dyspnea    GERD (gastroesophageal reflux disease)    Hyperlipidemia    Hypertension    Myocardial infarction [facility])    PAD (peripheral artery disease) ([place])    a. external iliac and mesenteric stenosis noted by noninvasive imaging.   Renal artery stenosis ([place])    a. 03/2011 PTA and stenting of L RA. b. last duplex 2016 with stable 1-59% bilateral RAS, incidental >50% R EIA stenosis   Past Surgical History:  Procedure Laterality Date   [place]     8/09  Cath--vein graft occlusions which are old--no acute changes   CARDIAC CATHETERIZATION  11/13/2010   stent x 2 @ [place]   CARDIAC CATHETERIZATION  04/05/2014   stent placement    CARDIAC CATHETERIZATION  10/28/2017   CLOSED REDUCTION SHOULDER DISLOCATION     COLONOSCOPY WITH PROPOFOL N/A 12/22/2018   Procedure: COLONOSCOPY WITH PROPOFOL;  Surgeon: Jonathon Bellows, MD;  Location: [facility] ENDOSCOPY;  Service: Gastroenterology;  Laterality: N/A;   CORONARY ANGIOPLASTY  04/05/2014   stent placement OM 1   CORONARY ARTERY BYPASS GRAFT     CORONARY STENT PLACEMENT  7/12   2 stents--Promus element plus (everolimus eluting)--[facility] in [place] WIRE/FFR STUDY N/A 05/13/2021   Procedure: INTRAVASCULAR PRESSURE WIRE/FFR STUDY;  Surgeon: Early Osmond, MD;  Location: [place] CV LAB;  Service: Cardiovascular;  Laterality: N/A;   LEFT HEART CATH AND CORONARY ANGIOGRAPHY N/A 05/13/2021   Procedure: LEFT HEART CATH AND CORONARY ANGIOGRAPHY;  Surgeon: Early Osmond, MD;   Location: [place] CV LAB;  Service: Cardiovascular;  Laterality: N/A;   LEFT HEART CATH AND CORS/GRAFTS ANGIOGRAPHY N/A 10/28/2017   Procedure: LEFT HEART CATH AND CORS/GRAFTS ANGIOGRAPHY;  Surgeon: Leonie Man, MD;  Location: [place] CV LAB;  Service: Cardiovascular;  Laterality: N/A;   LEFT HEART CATHETERIZATION WITH CORONARY/GRAFT ANGIOGRAM N/A 01/04/2012   Procedure: LEFT HEART CATHETERIZATION WITH Beatrix Fetters;  Surgeon: Burnell Blanks, MD;  Location: [facility] CATH LAB;  Service: Cardiovascular;  Laterality: N/A;   LEFT HEART CATHETERIZATION WITH CORONARY/GRAFT ANGIOGRAM N/A 04/05/2014   Procedure: LEFT HEART CATHETERIZATION WITH Beatrix Fetters;  Surgeon: Jettie Booze, MD;  Location: [facility] CATH LAB;  Service: Cardiovascular;  Laterality: N/A;   PERCUTANEOUS CORONARY STENT INTERVENTION (PCI-S)  04/05/2014   Procedure: PERCUTANEOUS CORONARY STENT INTERVENTION (PCI-S);  Surgeon: Jettie Booze, MD;  Location: [facility] CATH LAB;  Service: Cardiovascular;;  OM1   RENAL ANGIOGRAM N/A 04/16/2011  Procedure: RENAL ANGIOGRAM;  Surgeon: Burnell Blanks, MD;  Location: [facility] CATH LAB;  Service: Cardiovascular;  Laterality: N/A;   RENAL ARTERY STENT  03/2011 ?   RIGHT HEART CATH N/A 04/22/2022   Procedure: RIGHT HEART CATH;  Surgeon: Lorretta Harp, MD;  Location: [place] CV LAB;  Service: Cardiovascular;  Laterality: N/A;     Allergies:   Metoprolol tartrate, Quinolones, and Metoclopramide   Social History   Tobacco Use   Smoking status: Former    Packs/day: 3.00    Years: 25.00    Total pack years: 75.00    Types: Cigarettes    Quit date: 05/28/1993    Years since quitting: 28.9    Passive exposure: Past   Smokeless tobacco: Never  Vaping Use   Vaping Use: Never used  Substance Use Topics   Alcohol use: Yes    Alcohol/week: 42.0 standard drinks of alcohol    Types: 42 Cans of beer per week   Drug use: No     Current Outpatient  Medications on File Prior to Visit  Medication Sig Dispense Refill   aspirin 81 MG tablet Take 81 mg by mouth daily.     carvedilol (COREG) 3.125 MG tablet Take 1 tablet (3.125 mg total) by mouth 2 (two) times daily. 180 tablet 0   clopidogrel (PLAVIX) 75 MG tablet TAKE 1 TABLET BY MOUTH EVERY DAY 90 tablet 2   cyanocobalamin (VITAMIN B12) 1000 MCG tablet Take 1,000 mcg by mouth daily.     ezetimibe (ZETIA) 10 MG tablet Take 1 tablet (10 mg total) by mouth daily. 90 tablet 1   fludrocortisone (FLORINEF) 0.1 MG tablet Take 1 tablet (0.1 mg total) by mouth daily. 90 tablet 1   furosemide (LASIX) 40 MG tablet Take 1 tablet (40 mg total) by mouth daily as needed. Take as need for fluid retention.  Take 1 tablet if your weight increase more than 2 pounds in 24 hours. 30 tablet 1   gabapentin (NEURONTIN) 100 MG capsule Take 1 capsule (100 mg total) by mouth 2 (two) times daily. 180 capsule 3   hydrocortisone (CORTEF) 10 MG tablet Take 1 tablet daily at 8 AM, 1 tablet daily at noon (Patient taking differently: Take 10 mg by mouth 2 (two) times daily. Take 1 tablet (10 mg) by mouth daily at 8 AM and 1 tablet (10 mg) by mouth at noon 1200) 200 tablet 1   mirtazapine (REMERON) 15 MG tablet TAKE 1 TABLET BY MOUTH EVERYDAY AT BEDTIME 90 tablet 3   nitroGLYCERIN (NITROSTAT) 0.4 MG SL tablet PLACE 1 TABLET UNDER THE TONGUE EVERY 5 MINUTES AS NEEDED FOR CHEST PAIN. (Patient taking differently: Place 0.4 mg under the tongue every 5 (five) minutes as needed for chest pain (call 911).) 25 tablet 2   pantoprazole (PROTONIX) 40 MG tablet Take 1 tablet (40 mg total) by mouth 2 (two) times daily. 180 tablet 3   ranolazine (RANEXA) 500 MG 12 hr tablet Take 1 tablet (500 mg total) by mouth 2 (two) times daily. 180 tablet 3   rosuvastatin (CRESTOR) 40 MG tablet TAKE 1 TABLET BY MOUTH EVERY DAY 90 tablet 3   spironolactone (ALDACTONE) 25 MG tablet Take 0.5 tablets (12.5 mg total) by mouth daily. 45 tablet 0   No current  facility-administered medications on file prior to visit.     Family Hx: The patient's family history includes Coronary artery disease in his brother, father, mother, and sister; Heart attack in his father, maternal  grandfather, and mother; Hypertension in his mother. There is no history of Diabetes or Cancer.  ROS:   Please see the history of present illness.    Review of Systems  Constitutional: Negative.   HENT: Negative.    Respiratory: Negative.    Cardiovascular: Negative.   Gastrointestinal: Negative.   Musculoskeletal: Negative.   Neurological: Negative.   Psychiatric/Behavioral: Negative.    All other systems reviewed and are negative.    Labs/Other Tests and Data Reviewed:    Recent Labs: 01/24/2022: ALT 12 01/25/2022: TSH 1.342 04/20/2022: B Natriuretic Peptide 789.5 04/21/2022: Magnesium 2.7 04/22/2022: BUN 13; Creatinine, Ser 0.90; Hemoglobin 11.6; Hemoglobin 11.6; Platelets 362; Potassium 4.0; Potassium 4.1; Sodium 134; Sodium 133   Recent Lipid Panel Lab Results  Component Value Date/Time   CHOL 136 12/15/2021 08:19 AM   CHOL 163 08/11/2015 09:41 AM   TRIG 172.0 (H) 12/15/2021 08:19 AM   HDL 52.20 12/15/2021 08:19 AM   HDL 46 08/11/2015 09:41 AM   CHOLHDL 3 12/15/2021 08:19 AM   LDLCALC 50 12/15/2021 08:19 AM   LDLCALC 63 01/07/2017 11:20 AM   LDLDIRECT 65.0 02/20/2021 10:27 AM    Wt Readings from Last 3 Encounters:  05/10/22 170 lb (77.1 kg)  04/28/22 173 lb (78.5 kg)  04/22/22 167 lb 9.6 oz (76 kg)     Exam:    Vital Signs: Vital signs may also be detailed in the HPI BP 136/78 (BP Location: Left Arm, Patient Position: Sitting, Cuff Size: Normal)   Pulse 66   Ht '5\' 3"'$  (1.6 m)   Wt 170 lb (77.1 kg)   SpO2 98%   BMI 30.11 kg/m   Constitutional:  oriented to person, place, and time. No distress.  HENT:  Head: Grossly normal Eyes:  no discharge. No scleral icterus.  Neck: No JVD, no carotid bruits  Cardiovascular: Regular rate and rhythm, no  murmurs appreciated Pulmonary/Chest: Clear to auscultation bilaterally, no wheezes or rails Abdominal: Soft.  no distension.  no tenderness.  Musculoskeletal: Normal range of motion Neurological:  normal muscle tone. Coordination normal. No atrophy Skin: Skin warm and dry Psychiatric: normal affect, pleasant  ASSESSMENT & PLAN:    Problem List Items Addressed This Visit       Cardiology Problems   Essential hypertension   Coronary artery disease involving native coronary artery of native heart with angina pectoris ([place]) - Primary   AAA (abdominal aortic aneurysm) without rupture (HCC)   Aortic atherosclerosis (HCC)   Renal artery stenosis ([place])   Other Visit Diagnoses     HFrEF (heart failure with reduced ejection fraction) (HCC)       Ischemic cardiomyopathy       PAD (peripheral artery disease) (HCC)         CAD Prior non-STEMI, cardiac catheterization reviewed  Currently with no symptoms of angina. No further workup at this time. Continue current medication regimen.  Chronic systolic CHF Recent hospital showing requiring Lasix IV felt to be secondary to dietary indiscretion through the holidays.  We have recommended he take Lasix 42 days a week and as needed for dyspnea/PND orthopnea Chronic low sodium noted  PAD: AAA measuring 4.5 to 4.77 Closely monitored by vascular  Renal artery stenosis: prior stent on left 40 to 59% disease/stenosis bilaterally Followed by vascular Cholesterol at goal  ETOH Cessation recommended  Essential hypertension Blood pressure well-controlled, sometimes running low Recommend he hold the Lasix for any systolic pressure less than 100  Hyperlipidemia Cholesterol at goal on current  regiment  SOB with exertion CHF and COPD Long smoking hx, 3 ppd, quit 1995 Lasix as above  Back pain Chronic issue, stable at this time   Total encounter time more than 40 minutes  Greater than 50% was spent in counseling and coordination of care  with the patient   Signed, Ida Rogue, [place] [address], [place], [place] [address]

## 2022-05-10 ENCOUNTER — Encounter: Payer: Self-pay | Admitting: Internal Medicine

## 2022-05-10 ENCOUNTER — Ambulatory Visit: Payer: Medicare Other | Attending: Cardiovascular Disease | Admitting: Cardiovascular Disease

## 2022-05-10 ENCOUNTER — Encounter: Payer: Self-pay | Admitting: Cardiovascular Disease

## 2022-05-10 VITALS — BP 136/78 | HR 66 | Ht 63.0 in | Wt 170.0 lb

## 2022-05-10 DIAGNOSIS — I700 Atherosclerosis of aorta: Secondary | ICD-10-CM | POA: Diagnosis not present

## 2022-05-10 DIAGNOSIS — I10 Essential (primary) hypertension: Secondary | ICD-10-CM | POA: Diagnosis not present

## 2022-05-10 DIAGNOSIS — I25119 Atherosclerotic heart disease of native coronary artery with unspecified angina pectoris: Secondary | ICD-10-CM | POA: Diagnosis not present

## 2022-05-10 DIAGNOSIS — I714 Abdominal aortic aneurysm, without rupture, unspecified: Secondary | ICD-10-CM | POA: Diagnosis not present

## 2022-05-10 DIAGNOSIS — I739 Peripheral vascular disease, unspecified: Secondary | ICD-10-CM | POA: Diagnosis not present

## 2022-05-10 DIAGNOSIS — I5020 Unspecified systolic (congestive) heart failure: Secondary | ICD-10-CM

## 2022-05-10 DIAGNOSIS — I701 Atherosclerosis of renal artery: Secondary | ICD-10-CM

## 2022-05-10 DIAGNOSIS — I255 Ischemic cardiomyopathy: Secondary | ICD-10-CM

## 2022-05-10 MED ORDER — CARVEDILOL 3.125 MG PO TABS: 3.1250 mg | ORAL_TABLET | Freq: Two times a day (BID) | ORAL | 3 refills | Status: DC

## 2022-05-10 MED ORDER — FUROSEMIDE 40 MG PO TABS: ORAL_TABLET | ORAL | 3 refills | Status: DC

## 2022-05-10 MED ORDER — ROSUVASTATIN CALCIUM 40 MG PO TABS: 40.0000 mg | ORAL_TABLET | Freq: Every day | ORAL | 3 refills | Status: DC

## 2022-05-10 MED ORDER — RANOLAZINE ER 500 MG PO TB12: 500.0000 mg | ORAL_TABLET | Freq: Two times a day (BID) | ORAL | 3 refills | Status: DC

## 2022-05-10 MED ORDER — EZETIMIBE 10 MG PO TABS: 10.0000 mg | ORAL_TABLET | Freq: Every day | ORAL | 3 refills | Status: DC

## 2022-05-10 MED ORDER — SPIRONOLACTONE 25 MG PO TABS: 12.5000 mg | ORAL_TABLET | Freq: Every day | ORAL | 3 refills | Status: DC

## 2022-05-10 MED ORDER — CLOPIDOGREL BISULFATE 75 MG PO TABS: 75.0000 mg | ORAL_TABLET | Freq: Every day | ORAL | 3 refills | Status: DC

## 2022-05-10 NOTE — Patient Instructions (Addendum)
Medication Instructions:  - Your physician has recommended you make the following change in your medication:   1) Lasix (furosemide)  40 mg : - take 1 tablet (40 mg) two times a week (mon/thurs) - you may take an extra tablet (40 mg) once daily as needed for increased shortness of breath or swelling. If blood pressure runs low (top number <100), don't take the lasix  If you need a refill on your cardiac medications before your next appointment, please call your pharmacy.    Lab work: No new labs needed   Testing/Procedures: No new testing needed   Follow-Up: At [facility], you and your health needs are our priority.  As part of our continuing mission to provide you with exceptional heart care, we have created designated Provider Care Teams.  These Care Teams include your primary Cardiologist (physician) and Advanced Practice Providers (APPs -  Physician Assistants and Nurse Practitioners) who all work together to provide you with the care you need, when you need it.  You will need a follow up appointment in 6 months  Providers on your designated Care Team:   Murray Hodgkins, NP Christell Faith, PA-C Cadence Kathlen Mody, [place]  COVID-19 Vaccine Information can be found at: [email or web address] For questions related to vaccine distribution or appointments, please email [email or web address] or call [phone].

## 2022-05-13 ENCOUNTER — Other Ambulatory Visit: Payer: Self-pay | Admitting: Medical

## 2022-05-20 ENCOUNTER — Other Ambulatory Visit: Payer: Self-pay | Admitting: Internal Medicine

## 2022-05-21 ENCOUNTER — Ambulatory Visit: Payer: Self-pay

## 2022-05-21 NOTE — Patient Outreach (Signed)
  Care Coordination   Follow Up Visit Note   05/21/2022 Name: [name] MRN: [id number] DOB: 1951/03/13  [name] is a 72 y.o. year old male who sees Venia Carbon, MD for primary care. I spoke with  [name] by phone today.  What matters to the patients health and wellness today?  Management of heart failure    Goals Addressed             This Visit's Progress    Post hospital follow up for heart failure       Care Coordination Interventions: Basic overview and discussion of pathophysiology of Heart Failure reviewed Discussed adhering to low salt diet.  Reviewed Heart Failure Action Plan as instructed by providers. Patient states his providers advised him to take a lasix pill for a 2 lbs weight gain.  Assessed need for readable accurate scales in home:  Patient reports having scales and weighing daily.  Discussed importance of daily weight and advised patient to weigh and record daily Reviewed role of diuretics in prevention of fluid overload and management of heart failure; Discussed the importance of keeping all appointments with provider Reviewed scheduled/ upcoming appointments Reviewed medications and discussed importance of compliance.            SDOH assessments and interventions completed:  No     Care Coordination Interventions:  Yes, provided   Follow up plan: Follow up call scheduled for 07/01/22    Encounter Outcome:  Pt. Visit Completed   Quinn Plowman RN,BSN,CCM [place] [phone] direct line

## 2022-05-24 ENCOUNTER — Encounter: Payer: Self-pay | Admitting: Internal Medicine

## 2022-05-25 MED ORDER — MIRTAZAPINE 15 MG PO TABS: ORAL_TABLET | ORAL | 3 refills | Status: DC

## 2022-05-25 MED ORDER — GABAPENTIN 100 MG PO CAPS: 100.0000 mg | ORAL_CAPSULE | Freq: Two times a day (BID) | ORAL | 3 refills | Status: DC

## 2022-05-26 ENCOUNTER — Ambulatory Visit (INDEPENDENT_AMBULATORY_CARE_PROVIDER_SITE_OTHER): Payer: Medicare Other | Admitting: Internal Medicine

## 2022-05-26 ENCOUNTER — Encounter: Payer: Self-pay | Admitting: Internal Medicine

## 2022-05-26 VITALS — BP 104/76 | HR 69 | Temp 97.3°F | Ht 63.0 in | Wt 174.0 lb

## 2022-05-26 DIAGNOSIS — J441 Chronic obstructive pulmonary disease with (acute) exacerbation: Secondary | ICD-10-CM

## 2022-05-26 DIAGNOSIS — I255 Ischemic cardiomyopathy: Secondary | ICD-10-CM

## 2022-05-26 MED ORDER — PREDNISONE 20 MG PO TABS: 40.0000 mg | ORAL_TABLET | Freq: Every day | ORAL | 0 refills | Status: DC

## 2022-05-26 MED ORDER — DOXYCYCLINE HYCLATE 100 MG PO TABS: 100.0000 mg | ORAL_TABLET | Freq: Two times a day (BID) | ORAL | 0 refills | Status: DC

## 2022-05-26 NOTE — Assessment & Plan Note (Signed)
Mild but symptoms persist into the end of a week Will use empiric doxy 100 bid x 1 week Prednisone 40 x 5, 20 x 5 (no hypotension or symptoms to suggest adrenal insufficiency again) He monitors fluid/weight daily

## 2022-05-26 NOTE — Progress Notes (Signed)
Subjective:    Patient ID: [name], male    DOB: 1951-03-22, 72 y.o.   MRN: [id number]  HPI Here due to respiratory illness  Started almost a week ago Runny nose and chest congestion Wheezing and rattling in chest Wife sent him in--but he is now wondering if things are worse No real change in chronic SOB--except when congested (better after coughing up thick, clear sputum) No fever, chills, headaches No ear pain  Sore throat briefly at the beginning  Did try coricidin --?some help No analgesics  Reviewed the recommendations of Dr Nida--endocrine (is on the fludrocortisone) Dr Rockey Situ has him taking the furosemide twice a week regardless of weight (which has been stable)  Current Outpatient Medications on File Prior to Visit  Medication Sig Dispense Refill   aspirin 81 MG tablet Take 81 mg by mouth daily.     carvedilol (COREG) 3.125 MG tablet TAKE 1 TABLET BY MOUTH 2 TIMES DAILY. 180 tablet 3   clopidogrel (PLAVIX) 75 MG tablet Take 1 tablet (75 mg total) by mouth daily. 90 tablet 3   cyanocobalamin (VITAMIN B12) 1000 MCG tablet Take 1,000 mcg by mouth daily.     ezetimibe (ZETIA) 10 MG tablet Take 1 tablet (10 mg total) by mouth daily. 90 tablet 3   fludrocortisone (FLORINEF) 0.1 MG tablet Take 1 tablet (0.1 mg total) by mouth daily. 90 tablet 1   furosemide (LASIX) 40 MG tablet Take 1 tablet (40 mg) by mouth twice a week (Mondays & Thursdays). You may take 1 extra tablet (40 mg) daily as needed for increased swelling or shortness of breath. 45 tablet 3   gabapentin (NEURONTIN) 100 MG capsule Take 1 capsule (100 mg total) by mouth 2 (two) times daily. 180 capsule 3   hydrocortisone (CORTEF) 10 MG tablet Take 1 tablet daily at 8 AM, 1 tablet daily at noon (Patient taking differently: Take 10 mg by mouth 2 (two) times daily. Take 1 tablet (10 mg) by mouth daily at 8 AM and 1 tablet (10 mg) by mouth at noon 1200) 200 tablet 1   mirtazapine (REMERON) 15 MG tablet TAKE 1 TABLET  BY MOUTH AT BEDTIME 90 tablet 3   nitroGLYCERIN (NITROSTAT) 0.4 MG SL tablet PLACE 1 TABLET UNDER THE TONGUE EVERY 5 MINUTES AS NEEDED FOR CHEST PAIN. (Patient taking differently: Place 0.4 mg under the tongue every 5 (five) minutes as needed for chest pain (call 911).) 25 tablet 2   pantoprazole (PROTONIX) 40 MG tablet Take 1 tablet (40 mg total) by mouth 2 (two) times daily. 180 tablet 3   ranolazine (RANEXA) 500 MG 12 hr tablet Take 1 tablet (500 mg total) by mouth 2 (two) times daily. 180 tablet 3   rosuvastatin (CRESTOR) 40 MG tablet Take 1 tablet (40 mg total) by mouth daily. 90 tablet 3   spironolactone (ALDACTONE) 25 MG tablet Take 0.5 tablets (12.5 mg total) by mouth daily. 45 tablet 3   No current facility-administered medications on file prior to visit.    Allergies  Allergen Reactions   Metoprolol Tartrate Hives and Itching   Quinolones Other (See Comments)    Contraindicated with AAA   Metoclopramide Nausea Only    Past Medical History:  Diagnosis Date   AAA (abdominal aortic aneurysm) ([place])    a. Duplex 01/2012: stable infrarenal saccular AAA at 3.3cm x 3.2xm (f/u recommended 01/2013 per Dr. Rockey Situ)   Alcohol abuse    CAD (coronary artery disease)    a. 1995  s/p CABG x 4: LIMA->LAD, VG->RI (known to be occluded), VG->AM->PDA;  b. 05/2002 Inf STEMI: VG->AM->PDA 100% treated w/ 2 BMS complicated by acute thrombosis req 3 BMS;  c. 07/2003 DES to  LAD & LCX (VG's to PDA & RI 100%);  d. 12/2010 Acute MI ([place]): DES to LCX & LM , LIMA ok,;  e.12/2011 Cath: LM/LCX stents ok , LIMA patent.   Cardiomyopathy ([place])    a. EF 35% by cath 2015.   Diverticulitis    1/06 Diverticulitis--CT of pelvis--diffuse sigmoid divertic   Dyspnea    GERD (gastroesophageal reflux disease)    Hyperlipidemia    Hypertension    Myocardial infarction [facility])    PAD (peripheral artery disease) ([place])    a. external iliac and mesenteric stenosis noted by noninvasive imaging.   Renal artery stenosis ([place])     a. 03/2011 PTA and stenting of L RA. b. last duplex 2016 with stable 1-59% bilateral RAS, incidental >50% R EIA stenosis    Past Surgical History:  Procedure Laterality Date   [place]     8/09  Cath--vein graft occlusions which are old--no acute changes   CARDIAC CATHETERIZATION  11/13/2010   stent x 2 @ [place]   CARDIAC CATHETERIZATION  04/05/2014   stent placement    CARDIAC CATHETERIZATION  10/28/2017   CLOSED REDUCTION SHOULDER DISLOCATION     COLONOSCOPY WITH PROPOFOL N/A 12/22/2018   Procedure: COLONOSCOPY WITH PROPOFOL;  Surgeon: Jonathon Bellows, MD;  Location: [facility] ENDOSCOPY;  Service: Gastroenterology;  Laterality: N/A;   CORONARY ANGIOPLASTY  04/05/2014   stent placement OM 1   CORONARY ARTERY BYPASS GRAFT     CORONARY STENT PLACEMENT  7/12   2 stents--Promus element plus (everolimus eluting)--[facility] in [place] WIRE/FFR STUDY N/A 05/13/2021   Procedure: INTRAVASCULAR PRESSURE WIRE/FFR STUDY;  Surgeon: Early Osmond, MD;  Location: [place] CV LAB;  Service: Cardiovascular;  Laterality: N/A;   LEFT HEART CATH AND CORONARY ANGIOGRAPHY N/A 05/13/2021   Procedure: LEFT HEART CATH AND CORONARY ANGIOGRAPHY;  Surgeon: Early Osmond, MD;  Location: [place] CV LAB;  Service: Cardiovascular;  Laterality: N/A;   LEFT HEART CATH AND CORS/GRAFTS ANGIOGRAPHY N/A 10/28/2017   Procedure: LEFT HEART CATH AND CORS/GRAFTS ANGIOGRAPHY;  Surgeon: Leonie Man, MD;  Location: [place] CV LAB;  Service: Cardiovascular;  Laterality: N/A;   LEFT HEART CATHETERIZATION WITH CORONARY/GRAFT ANGIOGRAM N/A 01/04/2012   Procedure: LEFT HEART CATHETERIZATION WITH Beatrix Fetters;  Surgeon: Burnell Blanks, MD;  Location: [facility] CATH LAB;  Service: Cardiovascular;  Laterality: N/A;   LEFT HEART CATHETERIZATION WITH CORONARY/GRAFT ANGIOGRAM N/A 04/05/2014   Procedure: LEFT HEART CATHETERIZATION WITH Beatrix Fetters;  Surgeon: Jettie Booze, MD;  Location: [facility] CATH LAB;  Service: Cardiovascular;  Laterality: N/A;   PERCUTANEOUS CORONARY STENT INTERVENTION (PCI-S)  04/05/2014   Procedure: PERCUTANEOUS CORONARY STENT INTERVENTION (PCI-S);  Surgeon: Jettie Booze, MD;  Location: [facility] CATH LAB;  Service: Cardiovascular;;  OM1   RENAL ANGIOGRAM N/A 04/16/2011   Procedure: RENAL ANGIOGRAM;  Surgeon: Burnell Blanks, MD;  Location: [facility] CATH LAB;  Service: Cardiovascular;  Laterality: N/A;   RENAL ARTERY STENT  03/2011 ?   RIGHT HEART CATH N/A 04/22/2022   Procedure: RIGHT HEART CATH;  Surgeon: Lorretta Harp, MD;  Location: [place] CV LAB;  Service: Cardiovascular;  Laterality: N/A;    Family History  Problem Relation Age of Onset  Coronary artery disease Mother        Died MI age 29   Hypertension Mother    Heart attack Mother    Coronary artery disease Sister        Living   Coronary artery disease Brother        Living   Coronary artery disease Father        Died MI age 42   Heart attack Father    Heart attack Maternal Grandfather    Diabetes Neg Hx    Cancer Neg Hx        prostate or colon    Social History   Socioeconomic History   Marital status: Married    Spouse name: Not on file   Number of children: 3   Years of education: Not on file   Highest education level: Not on file  Occupational History   Occupation: Geneticist, molecular (Animal nutritionist)    Comment: Retired  Tobacco Use   Smoking status: Former    Packs/day: 3.00    Years: 25.00    Total pack years: 75.00    Types: Cigarettes    Quit date: 05/28/1993    Years since quitting: 29.0    Passive exposure: Past   Smokeless tobacco: Never  Vaping Use   Vaping Use: Never used  Substance and Sexual Activity   Alcohol use: Yes    Alcohol/week: 42.0 standard drinks of alcohol    Types: 42 Cans of beer per week   Drug use: No   Sexual activity: Not on file  Other Topics Concern   Not on  file  Social History Narrative   Working on living will   No health care POA but requests wife--then children   Would accept resuscitation   Not sure about tube feeds   Social Determinants of Health   Financial Resource Strain: Not on file  Food Insecurity: No Food Insecurity (04/23/2022)   Hunger Vital Sign    Worried About Running Out of Food in the Last Year: Never true    Ran Out of Food in the Last Year: Never true  Transportation Needs: No Transportation Needs (04/23/2022)   PRAPARE - Hydrologist (Medical): No    Lack of Transportation (Non-Medical): No  Physical Activity: Not on file  Stress: Not on file  Social Connections: Not on file  Intimate Partner Violence: Not on file   Review of Systems No N/V Eating okay Home COVID test negative on day 2     Objective:   Physical Exam Constitutional:      Appearance: Normal appearance.  HENT:     Head:     Comments: No sinus tenderness    Right Ear: Tympanic membrane and ear canal normal.     Left Ear: Tympanic membrane and ear canal normal.     Mouth/Throat:     Pharynx: No oropharyngeal exudate or posterior oropharyngeal erythema.  Cardiovascular:     Rate and Rhythm: Normal rate and regular rhythm.     Heart sounds: No murmur heard.    No gallop.  Pulmonary:     Effort: Pulmonary effort is normal.     Breath sounds: No rales.     Comments: Decreased breath sounds Mild expiratory phase prolongation and wheezes Musculoskeletal:     Cervical back: Neck supple.  Lymphadenopathy:     Cervical: No cervical adenopathy.  Neurological:     Mental Status: He is alert.  Assessment & Plan:

## 2022-06-07 ENCOUNTER — Other Ambulatory Visit: Payer: Self-pay | Admitting: Internal Medicine

## 2022-06-07 ENCOUNTER — Telehealth: Payer: Self-pay

## 2022-06-07 DIAGNOSIS — I5042 Chronic combined systolic (congestive) and diastolic (congestive) heart failure: Secondary | ICD-10-CM

## 2022-06-07 NOTE — Progress Notes (Signed)
Care Management & Coordination Services Pharmacy Note  06/07/2022 Name:  [name] MRN:  [id number] DOB:  1950-05-08  Summary: Initial OV -Pt was diagnosed with adrenal insufficiency Oct-Nov 99991111, which complicates treatment of HFrEF. Currently pt is stable on furosemide twice a week, weighing daily, and taking corticoids as prescribed. Pt was prescribed spironolactone in midst of workup for adrenal insufficiency, and as an aldosterone antagonist this may further complicate adrenal insufficiency -HF: recent exacerbation following fludrocortisone initiation. GDMT is limited by hypotension. Pt could not afford [facility] previously but will qualify for [employer] now. -DM: A1c 6.8% (04/2021) in diabetic range but not on treatment; he is due for re-check  Recommendations/Changes made from today's visit: -Consider stop spironolactone in s/o adrenal insufficiency, start Farxiga 10 mg - consulting with cardiology and endocrine -check A1c at next OV  Follow up plan: -Pharmacist follow up televisit scheduled for 1 month -Vascular appt 06/22/22; Cardiology appt 07/05/22; Endocrine appt 07/15/22; PCP appt 08/25/22    Subjective: [name] is an 72 y.o. year old male who is a primary patient of Venia Carbon, MD.  The care coordination team was consulted for assistance with disease management and care coordination needs.    Engaged with patient face to face for initial visit. Patient lives at home with his wife Manuela Schwartz. They have 7 acres of land a large garden, they harvest vegetables from and freeze them through the winter. They are from [place] and travel there sometimes to visit children and grandchildren. They moved to [place] over 20 years ago, and have 1 son living locally.   Recent office visits: 05/26/22 Dr Silvio Pate OV: COPD exacerbation - rx doxycycline, prednisone  04/28/22 Dr Silvio Pate OV: hospital f/u - fluid overload following fludrocortisone start.  02/24/22 Dr Silvio Pate OV:  annual - off ARB d/t hypotension; off Farxiga d/t cost.  Recent consult visits: 05/10/22 Dr Rockey Situ (Cardiology): f/u - recent hospital required IV lasix 2/2 dietary indiscretion over holidays; hold lasix for SBP < 100  04/09/22 Dr Dorris Fetch (Endocrine): adrenal insufficiency - rx hydrocortisone and fludrocortisone.   03/08/22 PA Cadence Kathlen Mody (Cardiology): HF; previously unable to afford [facility], not interested in PAP. Start spironolactone 12.5 mg daily  Hospital visits: 04/20/22 - 04/22/22 Admission [facility]): CHF exacerbation. Discharged on lasix PRN.   01/24/22 - 01/26/22 Admission [facility]): Hypotension, dizziness (SBP 50s). Suspicious for adrenal insufficiency. Sx improved with Decadron. Needs endocrine f/u.  Objective:  Lab Results  Component Value Date   CREATININE 0.90 04/22/2022   BUN 13 04/22/2022   GFR 87.27 12/15/2021   EGFR 96 06/04/2021   GFRNONAA >60 04/22/2022   GFRAA >60 01/18/2020   NA 134 (L) 04/22/2022   NA 133 (L) 04/22/2022   K 4.0 04/22/2022   K 4.1 04/22/2022   CALCIUM 8.9 04/22/2022   CO2 23 04/22/2022   GLUCOSE 135 (H) 04/22/2022    Lab Results  Component Value Date/Time   HGBA1C 6.8 (H) 05/14/2021 12:36 AM   HGBA1C 5.8 (H) 04/04/2014 03:39 PM   GFR 87.27 12/15/2021 08:19 AM   GFR 89.38 02/20/2021 10:27 AM    Last diabetic Eye exam: No results found for: "HMDIABEYEEXA"  Last diabetic Foot exam: No results found for: "HMDIABFOOTEX"   Lab Results  Component Value Date   CHOL 136 12/15/2021   HDL 52.20 12/15/2021   LDLCALC 50 12/15/2021   LDLDIRECT 65.0 02/20/2021   TRIG 172.0 (H) 12/15/2021   CHOLHDL 3 12/15/2021       Latest Ref Rng &  Units 01/24/2022   10:10 PM 12/15/2021    8:19 AM 05/13/2021    1:02 AM  Hepatic Function  Total Protein 6.5 - 8.1 g/dL 6.4  6.5    6.7  5.9   Albumin 3.5 - 5.0 g/dL 3.5  4.0  3.1   AST 15 - 41 U/L 18  15  38   ALT 0 - 44 U/L 12  8  23   $ Alk Phosphatase 38 - 126 U/L 36  37  33   Total Bilirubin 0.3 - 1.2 mg/dL 0.7   0.6  1.4     Lab Results  Component Value Date/Time   TSH 1.342 01/25/2022 05:50 PM   TSH 2.73 12/15/2021 08:19 AM   TSH 2.086 10/28/2017 04:16 PM   TSH 4.010 04/04/2014 03:39 PM   FREET4 0.83 01/05/2016 12:02 PM   FREET4 0.90 12/31/2014 10:25 AM       Latest Ref Rng & Units 04/22/2022    8:03 AM 04/22/2022    7:30 AM 04/20/2022    9:59 AM  CBC  WBC 4.0 - 10.5 K/uL  6.9  8.0   Hemoglobin 13.0 - 17.0 g/dL 13.0 - 17.0 g/dL 11.6    11.6  10.9  10.6   Hematocrit 39.0 - 52.0 % 39.0 - 52.0 % 34.0    34.0  34.3  34.1   Platelets 150 - 400 K/uL  362  336     Lab Results  Component Value Date/Time   VITAMINB12 750 01/25/2022 05:50 PM   VITAMINB12 219 12/15/2021 08:19 AM    Clinical ASCVD: Yes  The ASCVD Risk score (Arnett DK, et al., 2019) failed to calculate for the following reasons:   The patient has a prior MI or stroke diagnosis       02/24/2022   10:53 AM 08/19/2021    9:54 AM 06/09/2021    9:46 AM  Depression screen PHQ 2/9  Decreased Interest 0 0 0  Down, Depressed, Hopeless 0 0 0  PHQ - 2 Score 0 0 0  Altered sleeping  0 0  Tired, decreased energy  1 1  Change in appetite  0 0  Feeling bad or failure about yourself   0 0  Trouble concentrating  0 0  Moving slowly or fidgety/restless  0 0  Suicidal thoughts  0 0  PHQ-9 Score  1 1  Difficult doing work/chores  Not difficult at all Not difficult at all     Social History   Tobacco Use  Smoking Status Former   Packs/day: 3.00   Years: 25.00   Total pack years: 75.00   Types: Cigarettes   Quit date: 05/28/1993   Years since quitting: 29.0   Passive exposure: Past  Smokeless Tobacco Never   BP Readings from Last 3 Encounters:  05/26/22 104/76  05/10/22 136/78  04/28/22 136/80   Pulse Readings from Last 3 Encounters:  05/26/22 69  05/10/22 66  04/28/22 84   Wt Readings from Last 3 Encounters:  05/26/22 174 lb (78.9 kg)  05/10/22 170 lb (77.1 kg)  04/28/22 173 lb (78.5 kg)   BMI Readings from  Last 3 Encounters:  05/26/22 30.82 kg/m  05/10/22 30.11 kg/m  04/28/22 30.65 kg/m    Allergies  Allergen Reactions   Metoprolol Tartrate Hives and Itching   Quinolones Other (See Comments)    Contraindicated with AAA   Metoclopramide Nausea Only    Medications Reviewed Today     Reviewed by Charlton Haws,  [place] (Pharmacist) on 06/09/22 at 1033  Med List Status: <None>   Medication Order Taking? Sig Documenting Provider Last Dose Status Informant  aspirin 81 MG tablet [id number] Yes Take 81 mg by mouth daily. [provider] Taking Active Self  carvedilol (COREG) 3.125 MG tablet [id number] Yes TAKE 1 TABLET BY MOUTH 2 TIMES DAILY. Furth, Cadence H, PA-C Taking Active   clopidogrel (PLAVIX) 75 MG tablet [id number] Yes Take 1 tablet (75 mg total) by mouth daily. Minna Merritts, MD Taking Active   cyanocobalamin (VITAMIN B12) 1000 MCG tablet [id number] Yes Take 1,000 mcg by mouth daily. [provider] Taking Active Self  ezetimibe (ZETIA) 10 MG tablet [id number] Yes Take 1 tablet (10 mg total) by mouth daily. Minna Merritts, MD Taking Active   fludrocortisone (FLORINEF) 0.1 MG tablet [id number] Yes Take 1 tablet (0.1 mg total) by mouth daily. Cassandria Anger, MD Taking Active Self  furosemide (LASIX) 40 MG tablet [id number] Yes Take 1 tablet (40 mg) by mouth twice a week (Mondays & Thursdays). You may take 1 extra tablet (40 mg) daily as needed for increased swelling or shortness of breath. Minna Merritts, MD Taking Active   gabapentin (NEURONTIN) 100 MG capsule [id number] Yes Take 1 capsule (100 mg total) by mouth 2 (two) times daily. Venia Carbon, MD Taking Active   hydrocortisone (CORTEF) 10 MG tablet [id number] Yes Take 1 tablet daily at 8 AM, 1 tablet daily at noon Cassandria Anger, MD Taking Active Self  mirtazapine (REMERON) 15 MG tablet [id number] Yes TAKE 1 TABLET BY MOUTH AT BEDTIME Venia Carbon, MD Taking Active   nitroGLYCERIN  (NITROSTAT) 0.4 MG SL tablet [id number] Yes PLACE 1 TABLET UNDER THE TONGUE EVERY 5 MINUTES AS NEEDED FOR CHEST PAIN.  Patient taking differently: Place 0.4 mg under the tongue every 5 (five) minutes as needed for chest pain (call 911).   Venia Carbon, MD Taking Active Self  pantoprazole (PROTONIX) 40 MG tablet [id number] Yes Take 1 tablet (40 mg total) by mouth 2 (two) times daily. Venia Carbon, MD Taking Active Self  ranolazine (RANEXA) 500 MG 12 hr tablet [id number] Yes Take 1 tablet (500 mg total) by mouth 2 (two) times daily. Minna Merritts, MD Taking Active   rosuvastatin (CRESTOR) 40 MG tablet [id number] Yes Take 1 tablet (40 mg total) by mouth daily. Minna Merritts, MD Taking Active   spironolactone (ALDACTONE) 25 MG tablet [id number] Yes Take 0.5 tablets (12.5 mg total) by mouth daily. Minna Merritts, MD Taking Active             SDOH:  (Social Determinants of Health) assessments and interventions performed: No SDOH Interventions    Flowsheet Row Telephone from 04/23/2022 in [place] from 02/04/2022 in [place] Interventions    Food Insecurity Interventions Intervention Not Indicated Intervention Not Indicated  Housing Interventions Intervention Not Indicated Intervention Not Indicated  Transportation Interventions Intervention Not Indicated Intervention Not Indicated       Medication Assistance: None required.  Patient affirms current coverage meets needs.  Medication Access: Within the past 30 days, how often has patient missed a dose of medication? 0 Is a pillbox or other method used to improve adherence? Yes  Factors that may affect medication adherence? financial need Are meds synced by current pharmacy? No  Are meds delivered by current pharmacy? No  Does patient experience delays in picking up medications  due to transportation concerns? No    [facility] Services Reviewed: Is patient disadvantaged to use [facility]?: No  Current Rx insurance plan: [employer] [place] Name and location of Current pharmacy:  [address], [place] [address] Phone: [phone] Fax: [phone]  [facility] services reviewed with patient today?: No  Patient requests to transfer care to [facility]?: No  Reason patient declined to change pharmacies: Not mentioned at this visit  Compliance/Adherence/Medication fill history: Care Gaps: Colonoscopy (due 11/2021)  Star-Rating Drugs: Rosuvastatin - PDC   ASSESSMENT / PLAN  Heart Failure / HTN (Goal: BP < 130/80) -Controlled  - pt reports checking BP and weighing twice daily at home -Follows with cardiology (Dr Rockey Situ) -Home BP/HR readings: 100s/60s - 140s/80s -Home daily weights: 172# today; dry weight 168#; he took lasix today -Last ejection fraction: 30-35% (Date: 04/21/22) -HF type: HFrEF (EF < 40%) -NYHA Class: II (slight limitation of activity) -AHA HF Stage: C (Heart disease and symptoms present) -Current treatment: Carvedilol 3.125 mg BID - Appropriate, Effective, Safe, Accessible Furosemide 40 mg 2x weekly (+2# wt gain) - Appropriate, Effective, Safe, Accessible Spironolactone 25 mg - 1/2 tab daily  - Query appropriate -Medications previously tried: losartan (low BP)  -Current dietary habits: chicken, fish, vegetables from garden -Current exercise habits: limited currently -Educated on Benefits of medications for managing symptoms and prolonging life Importance of weighing daily; if you gain more than 2 pounds in one day or 5 pounds in one week, take Lasix. If using lasix 3 or more days in a row, call cardiology (concern for hypokalemia, kidney impairment) --Reviewed appropriateness of spironolactone given new dx adrenal insufficiency (aldosterone antagonists are  not recommended in Addison's disease); HF and adrenal insufficiency are competing processes and complicate treatment of each other - GDMT for HF is limited by hypotension as well; an SGLT2-ihibitor may be a more optimal choice for GDMT given lesser effect on BP, previously pt could not afford [place] but he will qualify for [employer] and can get this for free now. -Plan: consulting with cardiology re: stopping spironolactone and starting Farxiga 10 mg; Also consulting endocrine to see if fludrocortisone should be adjusted with stopping spironolactone  Hyperlipidemia / CAD: (LDL goal < 55) -Controlled - LDL 50 (11/2021) at goal -Hx CAD - CABG 1995; stent 2004; stent 2012; NSTEMI 04/2021 and 03/2022 -Current treatment: Ezetimibe 10 mg daily - Appropriate, Effective, Safe, Accessible Rosuvastatin 40 mg daily -Appropriate, Effective, Safe, Accessible Clopidogrel 75 mg daily -Appropriate, Effective, Safe, Accessible Aspirin 81 mg daily -Appropriate, Effective, Safe, Accessible Ranexa 500 mg BID -Appropriate, Effective, Safe, Accessible Nitroglycerin 0.4 mg SL prn - not used -Medications previously tried: n/a  -Educated on Cholesterol goals; Benefits of statin for ASCVD risk reduction; -Recommended to continue current medication  COPD (Goal: control symptoms and prevent exacerbations) -Controlled -Gold Grade: Gold 1 (FEV1>80%) -Current COPD Classification:  A (low sx, 0-1 moderate exacerbations, no hospitalizations) -MMRC/CAT score: not on file -Pulmonary function testing: 10/2017 - FEV1 84% predicted; FEV1/FVC 0.7 -Exacerbations requiring treatment in last 6 months: 1 -Current treatment  None -Medications previously tried: n/a   Adrenal insufficiency -Query controlled - pt reports still feels weak/fatigued, ever since discharge from hospital after HF exacerbation -Follows with endocrine (Dr Dorris Fetch). Dx 01/2022 -Current treatment  Fludrocortisone 0.1 mg daily - Appropriate, Effective,  Query Safe Hydrocortisone 10 mg 8am and noon - Appropriate, Query  Effective -Medications previously tried: dexamethasone  -See HF discussion above - possible interaction with spironolactone and fludrocortisone/adrenal insufficiency in general, consulting with cardiology and endocrine regarding best plan -Recommended to continue current medication  Query Diabetes (A1c goal <7%) -A1c 6.8% (04/2021). No diagnosis. -Pt would be more prone to hypoglycemia given adrenal insufficiency -Recommend to start Farxiga (see Heart Failure discussion)  Query sleep apnea -Patient saw pulmonary 10/2017 and recommended sleep study, which was never done  Health Maintenance -Vaccine gaps: Shingrix, RSV -On pantoprazole BID ? -on mirtazapine 15 mg HS -on gabapentin 100 mg BID -OTC: Vit B12   Charlene Brooke, PharmD, BCACP Clinical Pharmacist [facility] at [facility] [phone]

## 2022-06-07 NOTE — Progress Notes (Signed)
Care Management & Coordination Services Pharmacy Team  Reason for Encounter: Appointment Reminder  Contacted patient to confirm in office appointment with Charlene Brooke, PharmD on 06/08/2022 at 2:00.  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 PRN F/U 6 months 04/09/22 Loni Beckwith, MD (Endo) Adrenal Insufficiency Start: FLORINEF 0.1 MG tablet Start: CORTEF 10 MG tablet Completed: (DECADRON) 1 MG tablet F/U 6 months 04/07/22 cosyntropin (CORTROSYN) injection 0.25 mg Left Deltoid 03/25/22 Loni Beckwith, MD (Endo) Start: DECADRON 1 MG tablet Stop (provider): CORTEF) 10 MG tablet F/U 2 weeks 03/11/22 Lab Results: "Recent echo showed LVEF 30-35%. No anginal sxs. Myoview showed possible ischemia. I do not think there is much benefit of repeat cardiac catheterization. 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 The ischemia in the basal LAD is also expected. The supplied area in the proximal LAD is relatively small and not worse repeat PCI. If he is not having anginal symptoms, I do not recommend repeat cath." 03/08/22 Cadence Furth, [place] (Cardiology) HFrEF Start: Spironolactone 12.5 mg 02/12/22 NM MYO Mult Spect F/U 2 months 02/03/22 Karl Ito, NP Hypotension Hold: losartan 12.5 mg currently due to the  hypotension. Do not take Carvedilol if BP is low.  01/29/22 Cadence Kathlen Mody, PA (Cardiology) HFrEF Ordered: NM Myocar Multi "Findings are consistent with ischemia and prior myocardial infarction" Start: Farxiga 10 mg F/U 1 month 01/01/22 Ida Rogue, MD (Cardiology) Coronary artery disease Ordered: EKG "May need to hold the p.m. carvedilol for low systolic pressure" F/U 6 months  12/18/21 Leotis Pain, MD (Vascular Surgery)  AAA Duplex  Hospital visits:  Medication Reconciliation was completed by comparing discharge summary, patient's EMR and Pharmacy list, and upon discussion with patient.  Admitted to the hospital on 04/20/22 due to CHF. Discharge date was 04/22/2022. Discharged from [facility].    Procedure: Right heart Cath  New?Medications Started at [facility] Discharge:?? -started furosemide (Lasix)  Medications that remain the same after Hospital Discharge:??  -All other medications will remain the same.     Medication Reconciliation was completed by comparing discharge summary, patient's EMR and Pharmacy list, and upon discussion with patient.  Admitted to the hospital on 01/24/22 due to postural dizziness with presyncope . Discharge date was 01/26/2022. Discharged from [place]?Medications Started at [facility] Discharge:?? -started dexamethasone (DECADRON)  Medication Changes at Hospital Discharge: None noted  Medications Discontinued at Hospital Discharge: -Stopped tiZANidine 4 MG tablet (ZANAFLEX)   Medications that remain the same after Hospital Discharge:??  -All other medications will remain the same.    Star Rating Drugs:  Medication:  Last Fill: Day Supply Rosuvastatin 40 mg 05/10/2022 90  Care Gaps: Annual wellness visit in last year? Yes 02/24/2022  Charlene Brooke, PharmD notified  Marijean Niemann, [place] Pharmacy Assistant [phone]

## 2022-06-08 ENCOUNTER — Ambulatory Visit: Payer: Medicare Other | Admitting: Pharmacist

## 2022-06-13 ENCOUNTER — Other Ambulatory Visit: Payer: Self-pay | Admitting: Internal Medicine

## 2022-06-16 NOTE — Patient Instructions (Signed)
Visit Information  Phone number for Pharmacist: [phone]  Thank you for meeting with me to discuss your medications! Below is a summary of what we talked about during the visit:   Recommendations/Changes made from today's visit: -Consider stop spironolactone in s/o adrenal insufficiency, start [place] 10 mg - consulting with cardiology and endocrine -check A1c at next OV  Follow up plan: -Pharmacist follow up televisit scheduled for 1 month -Vascular appt 06/22/22; Cardiology appt 07/05/22; Endocrine appt 07/15/22; PCP appt 08/25/22   Charlene Brooke, PharmD, BCACP Clinical Pharmacist [facility] at [facility] [phone]

## 2022-06-21 ENCOUNTER — Ambulatory Visit (INDEPENDENT_AMBULATORY_CARE_PROVIDER_SITE_OTHER): Payer: Medicare Other | Admitting: Internal Medicine

## 2022-06-21 ENCOUNTER — Encounter: Payer: Self-pay | Admitting: Internal Medicine

## 2022-06-21 ENCOUNTER — Telehealth: Payer: Self-pay | Admitting: Internal Medicine

## 2022-06-21 ENCOUNTER — Ambulatory Visit (INDEPENDENT_AMBULATORY_CARE_PROVIDER_SITE_OTHER)
Admission: RE | Admit: 2022-06-21 | Discharge: 2022-06-21 | Disposition: A | Discharge status: Home / Self Care | Payer: Medicare Other | Source: Ambulatory Visit | Attending: Internal Medicine | Admitting: Internal Medicine

## 2022-06-21 ENCOUNTER — Other Ambulatory Visit (INDEPENDENT_AMBULATORY_CARE_PROVIDER_SITE_OTHER): Payer: Self-pay | Admitting: Vascular Surgery

## 2022-06-21 VITALS — BP 138/82 | HR 78 | Temp 97.9°F | Resp 24 | Ht 63.0 in | Wt 176.0 lb

## 2022-06-21 DIAGNOSIS — E2740 Unspecified adrenocortical insufficiency: Secondary | ICD-10-CM | POA: Diagnosis not present

## 2022-06-21 DIAGNOSIS — R0600 Dyspnea, unspecified: Secondary | ICD-10-CM

## 2022-06-21 DIAGNOSIS — I739 Peripheral vascular disease, unspecified: Secondary | ICD-10-CM

## 2022-06-21 DIAGNOSIS — I25119 Atherosclerotic heart disease of native coronary artery with unspecified angina pectoris: Secondary | ICD-10-CM

## 2022-06-21 DIAGNOSIS — I5042 Chronic combined systolic (congestive) and diastolic (congestive) heart failure: Secondary | ICD-10-CM

## 2022-06-21 DIAGNOSIS — R0602 Shortness of breath: Secondary | ICD-10-CM | POA: Diagnosis not present

## 2022-06-21 DIAGNOSIS — I255 Ischemic cardiomyopathy: Secondary | ICD-10-CM | POA: Diagnosis not present

## 2022-06-21 DIAGNOSIS — I714 Abdominal aortic aneurysm, without rupture, unspecified: Secondary | ICD-10-CM

## 2022-06-21 MED ORDER — FUROSEMIDE 40 MG PO TABS: 40.0000 mg | ORAL_TABLET | Freq: Every day | ORAL | 0 refills | Status: DC

## 2022-06-21 NOTE — Telephone Encounter (Signed)
Yes--sounds appropriate to just let me check him here in the office

## 2022-06-21 NOTE — Assessment & Plan Note (Addendum)
Classic symptoms suggestive of CHF worsening No illness--doesn't seem like infection Could be anginal equivalent though  Will check EKG and CXR  EKG shows sinus with PVCs (new since 2016). Poor R wave progression through V3 and non diagnostic inferior Q waves--no sig change. CXR shows in infiltrate or fluid

## 2022-06-21 NOTE — Assessment & Plan Note (Signed)
Seems to be worsening Will have him take the furosemide '40mg'$  daily---and to hold it if his home weight is under 163# (dry weight seems to be around 168#)

## 2022-06-21 NOTE — Telephone Encounter (Addendum)
I spoke with pts wife (DPR signed);hard to catch breath started last night at midnight; no CP and no H/A or dizziness. Woke pt up last night with trouble catching his breath; pt has trouble breathing about every 8th breath. Pt does not want to go to ED.no swelling or redness or pain in legs.pt got on phone and said he already has appt for [facility].pt does not sound SOB on phone now and is speaking in complete sentences. I advised pt Dr Silvio Pate had a cancellation today at 3 pm. Pt said to cancel [facility] appt and he will be at [facility] to see Dr Silvio Pate today at 3pm. Pt will try to be at office for ck in at 2:45 with UC & ED precautions and pt voiced understanding. Pt last saw Dr Silvio Pate on 05/26/22. Sending note to Dr Silvio Pate and Silvio Pate pool.

## 2022-06-21 NOTE — Assessment & Plan Note (Signed)
Subtle symptoms of fluid overload Will have him take the fursemide more often--but still may need to rethink the fludrocortisone (though Dr Dorris Fetch feels strongly about this)

## 2022-06-21 NOTE — Progress Notes (Signed)
Subjective:    Patient ID: [name], male    DOB: 02-Feb-1951, 73 y.o.   MRN: [id number]  HPI Here due to shortness of breath  Woke last night --raised in bed with wedge pillow--around midnight with SOB Sat up---and that helped him breath easier Has been taking it easy today--but still with get feeling of SOB--and has to take sighing breath at times No wheezing No fever or cough Doesn't feel sick  No edema Weighs twice a day--up 2# today----so did take furosemide this morning No chest pain Didn't try nitroglycerin No palpitations Does worsen with activity (like walking)  Current Outpatient Medications on File Prior to Visit  Medication Sig Dispense Refill   aspirin 81 MG tablet Take 81 mg by mouth daily.     carvedilol (COREG) 3.125 MG tablet TAKE 1 TABLET BY MOUTH 2 TIMES DAILY. 180 tablet 3   clopidogrel (PLAVIX) 75 MG tablet Take 1 tablet (75 mg total) by mouth daily. 90 tablet 3   cyanocobalamin (VITAMIN B12) 1000 MCG tablet Take 1,000 mcg by mouth daily.     ezetimibe (ZETIA) 10 MG tablet Take 1 tablet (10 mg total) by mouth daily. 90 tablet 3   fludrocortisone (FLORINEF) 0.1 MG tablet Take 1 tablet (0.1 mg total) by mouth daily. 90 tablet 1   furosemide (LASIX) 40 MG tablet Take 1 tablet (40 mg) by mouth twice a week (Mondays & Thursdays). You may take 1 extra tablet (40 mg) daily as needed for increased swelling or shortness of breath. 45 tablet 3   gabapentin (NEURONTIN) 100 MG capsule Take 1 capsule (100 mg total) by mouth 2 (two) times daily. 180 capsule 3   hydrocortisone (CORTEF) 10 MG tablet Take 1 tablet daily at 8 AM, 1 tablet daily at noon 200 tablet 1   mirtazapine (REMERON) 15 MG tablet TAKE 1 TABLET BY MOUTH AT BEDTIME 90 tablet 3   nitroGLYCERIN (NITROSTAT) 0.4 MG SL tablet PLACE 1 TABLET UNDER THE TONGUE EVERY 5 MINUTES AS NEEDED FOR CHEST PAIN. (Patient taking differently: Place 0.4 mg under the tongue every 5 (five) minutes as needed for chest pain  (call 911).) 25 tablet 2   pantoprazole (PROTONIX) 40 MG tablet TAKE 1 TABLET BY MOUTH TWICE DAILY 180 tablet 3   ranolazine (RANEXA) 500 MG 12 hr tablet Take 1 tablet (500 mg total) by mouth 2 (two) times daily. 180 tablet 3   rosuvastatin (CRESTOR) 40 MG tablet Take 1 tablet (40 mg total) by mouth daily. 90 tablet 3   spironolactone (ALDACTONE) 25 MG tablet Take 0.5 tablets (12.5 mg total) by mouth daily. 45 tablet 3   No current facility-administered medications on file prior to visit.    Allergies  Allergen Reactions   Metoprolol Tartrate Hives and Itching   Quinolones Other (See Comments)    Contraindicated with AAA   Metoclopramide Nausea Only    Past Medical History:  Diagnosis Date   AAA (abdominal aortic aneurysm) ([place])    a. Duplex 01/2012: stable infrarenal saccular AAA at 3.3cm x 3.2xm (f/u recommended 01/2013 per Dr. Rockey Situ)   Alcohol abuse    CAD (coronary artery disease)    a. 1995 s/p CABG x 4: LIMA->LAD, VG->RI (known to be occluded), VG->AM->PDA;  b. 05/2002 Inf STEMI: VG->AM->PDA 100% treated w/ 2 BMS complicated by acute thrombosis req 3 BMS;  c. 07/2003 DES to  LAD & LCX (VG's to PDA & RI 100%);  d. 12/2010 Acute MI ([place]): DES to LCX &  LM , LIMA ok,;  e.12/2011 Cath: LM/LCX stents ok , LIMA patent.   Cardiomyopathy ([place])    a. EF 35% by cath 2015.   Diverticulitis    1/06 Diverticulitis--CT of pelvis--diffuse sigmoid divertic   Dyspnea    GERD (gastroesophageal reflux disease)    Hyperlipidemia    Hypertension    Myocardial infarction [facility])    PAD (peripheral artery disease) ([place])    a. external iliac and mesenteric stenosis noted by noninvasive imaging.   Renal artery stenosis ([place])    a. 03/2011 PTA and stenting of L RA. b. last duplex 2016 with stable 1-59% bilateral RAS, incidental >50% R EIA stenosis    Past Surgical History:  Procedure Laterality Date   [place]     8/09  Cath--vein graft occlusions which are old--no  acute changes   CARDIAC CATHETERIZATION  11/13/2010   stent x 2 @ [place]   CARDIAC CATHETERIZATION  04/05/2014   stent placement    CARDIAC CATHETERIZATION  10/28/2017   CLOSED REDUCTION SHOULDER DISLOCATION     COLONOSCOPY WITH PROPOFOL N/A 12/22/2018   Procedure: COLONOSCOPY WITH PROPOFOL;  Surgeon: Jonathon Bellows, MD;  Location: [facility] ENDOSCOPY;  Service: Gastroenterology;  Laterality: N/A;   CORONARY ANGIOPLASTY  04/05/2014   stent placement OM 1   CORONARY ARTERY BYPASS GRAFT     CORONARY STENT PLACEMENT  7/12   2 stents--Promus element plus (everolimus eluting)--[facility] in [place] WIRE/FFR STUDY N/A 05/13/2021   Procedure: INTRAVASCULAR PRESSURE WIRE/FFR STUDY;  Surgeon: Early Osmond, MD;  Location: [place] CV LAB;  Service: Cardiovascular;  Laterality: N/A;   LEFT HEART CATH AND CORONARY ANGIOGRAPHY N/A 05/13/2021   Procedure: LEFT HEART CATH AND CORONARY ANGIOGRAPHY;  Surgeon: Early Osmond, MD;  Location: [place] CV LAB;  Service: Cardiovascular;  Laterality: N/A;   LEFT HEART CATH AND CORS/GRAFTS ANGIOGRAPHY N/A 10/28/2017   Procedure: LEFT HEART CATH AND CORS/GRAFTS ANGIOGRAPHY;  Surgeon: Leonie Man, MD;  Location: [place] CV LAB;  Service: Cardiovascular;  Laterality: N/A;   LEFT HEART CATHETERIZATION WITH CORONARY/GRAFT ANGIOGRAM N/A 01/04/2012   Procedure: LEFT HEART CATHETERIZATION WITH Beatrix Fetters;  Surgeon: Burnell Blanks, MD;  Location: [facility] CATH LAB;  Service: Cardiovascular;  Laterality: N/A;   LEFT HEART CATHETERIZATION WITH CORONARY/GRAFT ANGIOGRAM N/A 04/05/2014   Procedure: LEFT HEART CATHETERIZATION WITH Beatrix Fetters;  Surgeon: Jettie Booze, MD;  Location: [facility] CATH LAB;  Service: Cardiovascular;  Laterality: N/A;   PERCUTANEOUS CORONARY STENT INTERVENTION (PCI-S)  04/05/2014   Procedure: PERCUTANEOUS CORONARY STENT INTERVENTION (PCI-S);  Surgeon: Jettie Booze, MD;   Location: [facility] CATH LAB;  Service: Cardiovascular;;  OM1   RENAL ANGIOGRAM N/A 04/16/2011   Procedure: RENAL ANGIOGRAM;  Surgeon: Burnell Blanks, MD;  Location: [facility] CATH LAB;  Service: Cardiovascular;  Laterality: N/A;   RENAL ARTERY STENT  03/2011 ?   RIGHT HEART CATH N/A 04/22/2022   Procedure: RIGHT HEART CATH;  Surgeon: Lorretta Harp, MD;  Location: [place] CV LAB;  Service: Cardiovascular;  Laterality: N/A;    Family History  Problem Relation Age of Onset   Coronary artery disease Mother        Died MI age 34   Hypertension Mother    Heart attack Mother    Coronary artery disease Sister        Living   Coronary artery disease Brother        Living  Coronary artery disease Father        Died MI age 57   Heart attack Father    Heart attack Maternal Grandfather    Diabetes Neg Hx    Cancer Neg Hx        prostate or colon    Social History   Socioeconomic History   Marital status: Married    Spouse name: Not on file   Number of children: 3   Years of education: Not on file   Highest education level: Not on file  Occupational History   Occupation: Geneticist, molecular (Animal nutritionist)    Comment: Retired  Tobacco Use   Smoking status: Former    Packs/day: 3.00    Years: 25.00    Total pack years: 75.00    Types: Cigarettes    Quit date: 05/28/1993    Years since quitting: 29.0    Passive exposure: Past   Smokeless tobacco: Never  Vaping Use   Vaping Use: Never used  Substance and Sexual Activity   Alcohol use: Yes    Alcohol/week: 42.0 standard drinks of alcohol    Types: 42 Cans of beer per week   Drug use: No   Sexual activity: Not on file  Other Topics Concern   Not on file  Social History Narrative   Working on living will   No health care POA but requests wife--then children   Would accept resuscitation   Not sure about tube feeds   Social Determinants of Health   Financial Resource Strain: Not on file  Food Insecurity: No Food  Insecurity (04/23/2022)   Hunger Vital Sign    Worried About Running Out of Food in the Last Year: Never true    Ran Out of Food in the Last Year: Never true  Transportation Needs: No Transportation Needs (04/23/2022)   PRAPARE - Hydrologist (Medical): No    Lack of Transportation (Non-Medical): No  Physical Activity: Not on file  Stress: Not on file  Social Connections: Not on file  Intimate Partner Violence: Not on file   Review of Systems Eating fine Had been sleeping okay till last night----will awaken at 4AM some days (and takes an hour to get back to sleep)       Physical Exam Constitutional:      Appearance: Normal appearance.  Cardiovascular:     Rate and Rhythm: Normal rate and regular rhythm.     Heart sounds: No murmur heard.    No gallop.  Pulmonary:     Effort: Pulmonary effort is normal.     Breath sounds: Normal breath sounds. No wheezing or rales.  Musculoskeletal:     Cervical back: Neck supple.     Right lower leg: No edema.     Left lower leg: No edema.  Lymphadenopathy:     Cervical: No cervical adenopathy.  Neurological:     Mental Status: He is alert.            Assessment & Plan:

## 2022-06-21 NOTE — Telephone Encounter (Signed)
[facility] - Client TELEPHONE ADVICE RECORD [facility] Patient Name: [name] Gender: Male DOB: 05-May-1950 Age: 72 Y 73 M 3 D Return Phone Number: [phone] (Primary), [phone] (Secondary) Address: [place] [address] Client [place] [facility] - Client Client Site [place] - Day Provider Viviana Simpler- MD Contact Type Call Who Is Calling Patient / Member / Family / Caregiver Call Type Triage / Clinical Caller Name Adlai Graeve Relationship To Patient Spouse Return Phone Number [phone] (Primary) Chief Complaint BREATHING - shortness of breath or sounds breathless Reason for Call Symptomatic / Request for [place] states her husband is having some difficulty breathing. Translation No Nurse Assessment Nurse: Patsey Berthold, RN, Roma Kayser Date/Time Eilene Ghazi Time): 06/21/2022 12:13:25 PM Confirm and document reason for call. If symptomatic, describe symptoms. ---Caller states her husband is having some difficulty breathing. Onset last night, Has a problem getting his breath, is breathing more loudly to grasp air. Does the patient have any new or worsening symptoms? ---Yes Will a triage be completed? ---Yes Related visit to physician within the last 2 weeks? ---No Does the PT have any chronic conditions? (i.e. diabetes, asthma, this includes High risk factors for pregnancy, etc.) ---Yes List chronic conditions. ---CHF, HTN, Addisons disease, several MIs Is this a behavioral health or substance abuse call? ---No Guidelines Guideline Title Affirmed Question Affirmed Notes Nurse Date/Time (Eastern Time) Breathing Difficulty [1] MILD difficulty breathing (e.g., minimal/no SOB at rest, SOB with walking, pulse <100) AND [2] NEW-onset or WORSE than normal Vivi Ferns 06/21/2022 12:15:30 PM PLEASE NOTE: All timestamps  contained within this report are represented as [place] Standard Time. CONFIDENTIALTY NOTICE: This fax transmission is intended only for the addressee. It contains information that is legally privileged, confidential or otherwise protected from use or disclosure. If you are not the intended recipient, you are strictly prohibited from reviewing, disclosing, copying using or disseminating any of this information or taking any action in reliance on or regarding this information. If you have received this fax in error, please notify us immediately by telephone so that we can arrange for its return to [place]. Phone: [phone], Toll-Free: [phone], Fax: [phone] Page: 2 of 2 Call Id: [id number] [place]. Time Eilene Ghazi Time) Disposition Final User 06/21/2022 12:12:33 PM Send to Urgent [place], [place] 06/21/2022 12:41:08 PM See HCP within 4 Hours (or PCP triage) Yes Patsey Berthold, RN, Roma Kayser Final Disposition 06/21/2022 12:41:08 PM See HCP within 4 Hours (or PCP triage) Yes Patsey Berthold, RN, Melvyn Novas Disagree/Comply Disagree Caller Understands Yes PreDisposition Call Doctor Care Advice Given Per Guideline SEE HCP (OR PCP TRIAGE) WITHIN 4 HOURS: * IF OFFICE WILL BE OPEN: You need to be seen within the next 3 or 4 hours. Call your doctor (or NP/PA) now or as soon as the office opens. CARE ADVICE given per Breathing Difficulty (Adult) guideline. CALL BACK IF: * You become worse Comments User: Diana Eves, RN Date/Time Eilene Ghazi Time): 06/21/2022 [id number] PM No chest pain, no s/s of heart problems now. Caller states he has to sit up to breath, is very difficult to breath laying down. Has to be straight up to breath. User: Diana Eves, RN Date/Time Eilene Ghazi Time): 06/21/2022 12:20:50 PM Has catherization of heart recently in Dec/ Jan. BP 118/ 80. HR 53, 97% O2 User: Diana Eves, RN Date/Time Eilene Ghazi Time): 06/21/2022 12:28:43 PM Patient reports he is able to walk and stand without  significant difficulty breathing, does feel  a little short of rbeath with activity. Caller insists he has not felt or experienced swelling, heart palpitations, cough, congestion, or chest pain. User: Diana Eves, RN Date/Time Eilene Ghazi Time): 06/21/2022 12:40:57 PM Per office there is no availability to be seen today, caller was referred to UC/ ED for evaluation. They refused instead opting to try and schedule appointment with office online. User: Diana Eves, RN Date/Time Eilene Ghazi Time): 06/21/2022 12:56:34 PM Informed office that patient and spouse refused to be seen at ED/ UC. Referrals GO TO FACILITY REFUSED REFERRED TO PCP OFFIC

## 2022-06-21 NOTE — Patient Instructions (Signed)
Please take extra furosemide now--then take daily unless your weight is under 163# at home

## 2022-06-21 NOTE — Telephone Encounter (Signed)
Pt called in requesting a call back regarding what was discuss at the last meeting . Please advise [phone]

## 2022-06-21 NOTE — Assessment & Plan Note (Signed)
I don't think this spell was angina since it woke him up and mild symptoms since then

## 2022-06-22 ENCOUNTER — Ambulatory Visit (INDEPENDENT_AMBULATORY_CARE_PROVIDER_SITE_OTHER): Payer: Medicare Other

## 2022-06-22 ENCOUNTER — Encounter: Payer: Self-pay | Admitting: Internal Medicine

## 2022-06-22 ENCOUNTER — Ambulatory Visit (INDEPENDENT_AMBULATORY_CARE_PROVIDER_SITE_OTHER): Payer: Medicare Other | Admitting: Vascular Surgery

## 2022-06-22 VITALS — BP 131/85 | HR 60 | Resp 18 | Ht 63.0 in | Wt 176.0 lb

## 2022-06-22 DIAGNOSIS — I739 Peripheral vascular disease, unspecified: Secondary | ICD-10-CM

## 2022-06-22 DIAGNOSIS — I70219 Atherosclerosis of native arteries of extremities with intermittent claudication, unspecified extremity: Secondary | ICD-10-CM | POA: Insufficient documentation

## 2022-06-22 DIAGNOSIS — I255 Ischemic cardiomyopathy: Secondary | ICD-10-CM

## 2022-06-22 DIAGNOSIS — I714 Abdominal aortic aneurysm, without rupture, unspecified: Secondary | ICD-10-CM

## 2022-06-22 DIAGNOSIS — I25119 Atherosclerotic heart disease of native coronary artery with unspecified angina pectoris: Secondary | ICD-10-CM

## 2022-06-22 DIAGNOSIS — E785 Hyperlipidemia, unspecified: Secondary | ICD-10-CM

## 2022-06-22 DIAGNOSIS — I713 Abdominal aortic aneurysm, ruptured, unspecified: Secondary | ICD-10-CM

## 2022-06-22 DIAGNOSIS — I10 Essential (primary) hypertension: Secondary | ICD-10-CM

## 2022-06-22 NOTE — Assessment & Plan Note (Signed)
ABIs today demonstrate a right ABI of 1.16 with multiphasic waveforms and a left ABI of 0.81 with monophasic waveforms.  Digital pressures are reduced bilaterally.  Also of note, on his AAA duplex bilateral iliac artery stenosis were identified.  We will be obtaining CTA of the abdomen pelvis which will also give [place] more information, but he may get some improvement in his perfusion concomitant to his abdominal aortic aneurysm repair.

## 2022-06-22 NOTE — Patient Instructions (Signed)
Abdominal Aortic Aneurysm Endograft Repair Abdominal aortic aneurysm endograft repair is surgery to fix an abdominal aortic aneurysm (AAA). An aneurysm is a bulge in an artery. It happens when blood pushes against a weak or damaged artery wall. An AAA happens in the lower part of the main artery of the body (aorta). This repair may be done if the AAA: Causes pain in your back, abdomen, or side. Gets so large it might burst (rupture). A rupture is a medical emergency. It can cause bleeding inside your body. During the repair, a tube made of fabric and metal mesh (endograft or stent-graft) is put in the weak part of the aorta to fix it. Tell a health care provider about: Any allergies you have. All medicines you are taking, including vitamins, herbs, eye drops, creams, and over-the-counter medicines. Any problems you or family members have had with anesthesia. Any bleeding problems you have. Any surgeries you have had. Any medical conditions you have. Whether you are pregnant or may be pregnant. What are the risks? Your health care provider will talk with you about risks. These may include: Infection. Bleeding. Allergic reactions to medicines. Damage to nearby structures or organs. Blood leaking out around the graft. The endograft moving from where it was placed. Blocked blood flow through the graft. Other problems may include: Kidney problems. Blood clots. Blocked blood flow to the legs. This is rare. Rupture of the aorta, even after an endograft repair. This is rare. What happens before the procedure? When to stop eating and drinking Follow instructions from your provider about what you may eat and drink. These may include: 8 hours before your procedure Stop eating most foods. Do not eat meat, fried foods, or fatty foods. Eat only light foods, such as toast or crackers. All liquids are okay except energy drinks and alcohol. 6 hours before your procedure Stop eating. Drink only  clear liquids, such as water, clear fruit juice, black coffee, plain tea, and sports drinks. Do not drink energy drinks or alcohol. 2 hours before your procedure Stop drinking all liquids. You may be allowed to take medicines with small sips of water. If you do not follow your provider's instructions, your procedure may be delayed or canceled. Medicines Ask your provider about: Changing or stopping your regular medicines. These include any diabetes medicines or blood thinners you take. Taking medicines such as aspirin and ibuprofen. These medicines can thin your blood. Do not take them unless your provider tells you to. Taking over-the-counter medicines, vitamins, herbs, and supplements. Tests You may have tests done. These may include: Blood tests. Electrocardiogram (ECG). This test checks your heart rhythm. A stress test, if you have signs of heart problems. Imaging tests, such as ultrasound, CT scan, or MRI. These may be done to see where the aneurysm is and how big it is. Surgery safety Ask your provider: How your surgery site will be marked. What steps will be taken to help prevent infection. These steps may include: Removing hair at the surgery site. Washing skin with a soap that kills germs. Taking antibiotics. General instructions Do not use any products that contain nicotine or tobacco for at least 4 weeks before the procedure. These products include cigarettes, chewing tobacco, and vaping devices, such as e-cigarettes. If you need help quitting, ask your provider. If you will be going home right after the procedure, plan to have a responsible adult: Take you home from the hospital. You will not be allowed to drive. Care for you for the  time you are told. What happens during the procedure?  An IV will be inserted into one of your veins. You may be given: A sedative. This helps you relax. Anesthesia. This keeps you from feeling pain. It will make you fall asleep for  surgery. Small incisions or a puncture will be made on one or both sides of your groin. Soft tubes (catheters) will be passed through the incisions. They will be moved up into the aneurysm in the aorta. With the help of X-ray pictures, your provider will guide the graft through the catheter to the aneurysm. The graft will be released to seal off the aneurysm and line the aorta. It will stay in place. It will not be taken out. X-rays will be used to make sure that the graft is correctly placed. The catheter will be taken out. The incision may be closed with stitches (sutures), skin glue, or tape strips. It may be covered with a bandage (dressing). The procedure may vary among providers and hospitals. What happens after the procedure? Your blood pressure, heart rate, breathing rate, and blood oxygen level will be monitored until you leave the hospital or clinic. You may be given pain medicine as needed. You will lie flat for a number of hours, without bending your legs. When told, you should get up and move around a number of times each day. Slowly become more active. Imaging tests may be done. These will check that the endograft was put in the right spot and that it is working like it should. Wear compression stockings as told by your provider. These stockings help to prevent blood clots and reduce swelling in your legs. This information is not intended to replace advice given to you by your health care provider. Make sure you discuss any questions you have with your health care provider. Document Revised: 11/17/2021 Document Reviewed: 11/17/2021 Elsevier Patient Education  [employer].

## 2022-06-22 NOTE — Progress Notes (Signed)
MRN : [id number]  [name] is a 72 y.o. (April 13, 1951) male who presents with chief complaint of  Chief Complaint  Patient presents with   Follow-up    Follow up  6 month AAA + ABI. see  .  History of Present Illness: Patient returns today in follow up of multiple vascular issues.  He continues to have tiredness and aching in his legs with walking in both lower extremities.  No open wounds or infection.  No fevers or chills.  No symptoms concerning for rest pain.  ABIs today demonstrate a right ABI of 1.16 with multiphasic waveforms and a left ABI of 0.81 with monophasic waveforms.  Digital pressures are reduced bilaterally. He is also followed for his abdominal aortic aneurysm.  We have been following this serially and it was 4.5 cm a year ago and 4.8 cm 6 months ago. Duplex today shows continued growth of his abdominal aortic aneurysm now measuring 4.95 cm in maximal diameter.  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  Current Outpatient Medications  Medication Sig Dispense Refill   aspirin 81 MG tablet Take 81 mg by mouth daily.     carvedilol (COREG) 3.125 MG tablet TAKE 1 TABLET BY MOUTH 2 TIMES DAILY. 180 tablet 3   clopidogrel (PLAVIX) 75 MG tablet Take 1 tablet (75 mg total) by mouth daily. 90 tablet 3   cyanocobalamin (VITAMIN B12) 1000 MCG tablet Take 1,000 mcg by mouth daily.     ezetimibe (ZETIA) 10 MG tablet Take 1 tablet (10 mg total) by mouth daily. 90 tablet 3   fludrocortisone (FLORINEF) 0.1 MG tablet Take 1 tablet (0.1 mg total) by mouth daily. 90 tablet 1   furosemide (LASIX) 40 MG tablet Take 1 tablet (40 mg total) by mouth daily. Hold if weight under 163# 1 tablet 0   gabapentin (NEURONTIN) 100 MG capsule Take 1 capsule (100 mg total) by mouth 2 (two) times daily. 180 capsule 3   hydrocortisone (CORTEF) 10 MG tablet Take 1 tablet daily at 8 AM, 1 tablet daily at noon 200 tablet 1    mirtazapine (REMERON) 15 MG tablet TAKE 1 TABLET BY MOUTH AT BEDTIME 90 tablet 3   nitroGLYCERIN (NITROSTAT) 0.4 MG SL tablet PLACE 1 TABLET UNDER THE TONGUE EVERY 5 MINUTES AS NEEDED FOR CHEST PAIN. (Patient taking differently: Place 0.4 mg under the tongue every 5 (five) minutes as needed for chest pain (call 911).) 25 tablet 2   pantoprazole (PROTONIX) 40 MG tablet TAKE 1 TABLET BY MOUTH TWICE DAILY 180 tablet 3   ranolazine (RANEXA) 500 MG 12 hr tablet Take 1 tablet (500 mg total) by mouth 2 (two) times daily. 180 tablet 3   rosuvastatin (CRESTOR) 40 MG tablet Take 1 tablet (40 mg total) by mouth daily. 90 tablet 3   spironolactone (ALDACTONE) 25 MG tablet Take 0.5 tablets (12.5 mg total) by mouth daily. 45 tablet 3   No current facility-administered medications for this visit.    Past Medical History:  Diagnosis Date   AAA (abdominal aortic aneurysm) ([place])    a. Duplex 01/2012: stable infrarenal saccular AAA at 3.3cm x 3.2xm (f/u recommended 01/2013 per Dr. Rockey Situ)   Alcohol abuse    CAD (coronary artery disease)    a. 1995 s/p CABG x 4: LIMA->LAD, VG->RI (known to be occluded), VG->AM->PDA;  b. 05/2002 Inf STEMI: VG->AM->PDA 100% treated w/ 2 BMS complicated by acute thrombosis req  3 BMS;  c. 07/2003 DES to  LAD & LCX (VG's to PDA & RI 100%);  d. 12/2010 Acute [place] ([place]): DES to LCX & LM , LIMA ok,;  e.12/2011 Cath: LM/LCX stents ok , LIMA patent.   Cardiomyopathy ([place])    a. EF 35% by cath 2015.   Diverticulitis    1/06 Diverticulitis--CT of pelvis--diffuse sigmoid divertic   Dyspnea    GERD (gastroesophageal reflux disease)    Hyperlipidemia    Hypertension    Myocardial infarction [facility])    PAD (peripheral artery disease) ([place])    a. external iliac and mesenteric stenosis noted by noninvasive imaging.   Renal artery stenosis ([place])    a. 03/2011 PTA and stenting of L RA. b. last duplex 2016 with stable 1-59% bilateral RAS, incidental >50% R EIA stenosis    Past Surgical History:   Procedure Laterality Date   [place]     8/09  Cath--vein graft occlusions which are old--no acute changes   CARDIAC CATHETERIZATION  11/13/2010   stent x 2 @ [place]   CARDIAC CATHETERIZATION  04/05/2014   stent placement    CARDIAC CATHETERIZATION  10/28/2017   CLOSED REDUCTION SHOULDER DISLOCATION     COLONOSCOPY WITH PROPOFOL N/A 12/22/2018   Procedure: COLONOSCOPY WITH PROPOFOL;  Surgeon: Jonathon Bellows, MD;  Location: [facility] ENDOSCOPY;  Service: Gastroenterology;  Laterality: N/A;   CORONARY ANGIOPLASTY  04/05/2014   stent placement OM 1   CORONARY ARTERY BYPASS GRAFT     CORONARY STENT PLACEMENT  7/12   2 stents--Promus element plus (everolimus eluting)--[facility] in [place] WIRE/FFR STUDY N/A 05/13/2021   Procedure: INTRAVASCULAR PRESSURE WIRE/FFR STUDY;  Surgeon: Early Osmond, MD;  Location: [place] CV LAB;  Service: Cardiovascular;  Laterality: N/A;   LEFT HEART CATH AND CORONARY ANGIOGRAPHY N/A 05/13/2021   Procedure: LEFT HEART CATH AND CORONARY ANGIOGRAPHY;  Surgeon: Early Osmond, MD;  Location: [place] CV LAB;  Service: Cardiovascular;  Laterality: N/A;   LEFT HEART CATH AND CORS/GRAFTS ANGIOGRAPHY N/A 10/28/2017   Procedure: LEFT HEART CATH AND CORS/GRAFTS ANGIOGRAPHY;  Surgeon: Leonie Man, MD;  Location: [place] CV LAB;  Service: Cardiovascular;  Laterality: N/A;   LEFT HEART CATHETERIZATION WITH CORONARY/GRAFT ANGIOGRAM N/A 01/04/2012   Procedure: LEFT HEART CATHETERIZATION WITH Beatrix Fetters;  Surgeon: Burnell Blanks, MD;  Location: [facility] CATH LAB;  Service: Cardiovascular;  Laterality: N/A;   LEFT HEART CATHETERIZATION WITH CORONARY/GRAFT ANGIOGRAM N/A 04/05/2014   Procedure: LEFT HEART CATHETERIZATION WITH Beatrix Fetters;  Surgeon: Jettie Booze, MD;  Location: [facility] CATH LAB;  Service: Cardiovascular;  Laterality: N/A;   PERCUTANEOUS CORONARY STENT  INTERVENTION (PCI-S)  04/05/2014   Procedure: PERCUTANEOUS CORONARY STENT INTERVENTION (PCI-S);  Surgeon: Jettie Booze, MD;  Location: [facility] CATH LAB;  Service: Cardiovascular;;  OM1   RENAL ANGIOGRAM N/A 04/16/2011   Procedure: RENAL ANGIOGRAM;  Surgeon: Burnell Blanks, MD;  Location: [facility] CATH LAB;  Service: Cardiovascular;  Laterality: N/A;   RENAL ARTERY STENT  03/2011 ?   RIGHT HEART CATH N/A 04/22/2022   Procedure: RIGHT HEART CATH;  Surgeon: Lorretta Harp, MD;  Location: [place] CV LAB;  Service: Cardiovascular;  Laterality: N/A;     Social History   Tobacco Use   Smoking status: Former    Packs/day: 3.00    Years: 25.00    Total pack years: 75.00    Types: Cigarettes  Quit date: 05/28/1993    Years since quitting: 29.0    Passive exposure: Past   Smokeless tobacco: Never  Vaping Use   Vaping Use: Never used  Substance Use Topics   Alcohol use: Yes    Alcohol/week: 42.0 standard drinks of alcohol    Types: 42 Cans of beer per week   Drug use: No      Family History  Problem Relation Age of Onset   Coronary artery disease Mother        Died MI age 24   Hypertension Mother    Heart attack Mother    Coronary artery disease Sister        Living   Coronary artery disease Brother        Living   Coronary artery disease Father        Died MI age 67   Heart attack Father    Heart attack Maternal Grandfather    Diabetes Neg Hx    Cancer Neg Hx        prostate or colon     Allergies  Allergen Reactions   Metoprolol Tartrate Hives and Itching   Quinolones Other (See Comments)    Contraindicated with AAA   Metoclopramide Nausea Only    REVIEW OF SYSTEMS (Negative unless checked)   Constitutional: '[]'$ Weight loss  '[]'$ Fever  '[]'$ Chills Cardiac: '[]'$ Chest pain   '[]'$ Chest pressure   '[x]'$ Palpitations   '[]'$ Shortness of breath when laying flat   '[]'$ Shortness of breath at rest   '[x]'$ Shortness of breath with exertion. Vascular:  '[]'$ Pain in legs with walking    '[]'$ Pain in legs at rest   '[]'$ Pain in legs when laying flat   '[]'$ Claudication   '[]'$ Pain in feet when walking  '[]'$ Pain in feet at rest  '[]'$ Pain in feet when laying flat   '[]'$ History of DVT   '[]'$ Phlebitis   '[]'$ Swelling in legs   '[]'$ Varicose veins   '[]'$ Non-healing ulcers Pulmonary:   '[]'$ Uses home oxygen   '[]'$ Productive cough   '[]'$ Hemoptysis   '[]'$ Wheeze  '[x]'$ COPD   '[]'$ Asthma Neurologic:  '[]'$ Dizziness  '[]'$ Blackouts   '[]'$ Seizures   '[]'$ History of stroke   '[]'$ History of TIA  '[]'$ Aphasia   '[]'$ Temporary blindness   '[]'$ Dysphagia   '[]'$ Weakness or numbness in arms   '[]'$ Weakness or numbness in legs Musculoskeletal:  '[x]'$ Arthritis   '[]'$ Joint swelling   '[x]'$ Joint pain   '[]'$ Low back pain Hematologic:  '[]'$ Easy bruising  '[]'$ Easy bleeding   '[]'$ Hypercoagulable state   '[]'$ Anemic  '[]'$ Hepatitis Gastrointestinal:  '[]'$ Blood in stool   '[]'$ Vomiting blood  '[x]'$ Gastroesophageal reflux/heartburn   '[]'$ Abdominal pain Genitourinary:  '[]'$ Chronic kidney disease   '[]'$ Difficult urination  '[]'$ Frequent urination  '[]'$ Burning with urination   '[]'$ Hematuria Skin:  '[]'$ Rashes   '[]'$ Ulcers   '[]'$ Wounds Psychological:  '[]'$ History of anxiety   '[]'$  History of major depression.  Physical Examination  BP 131/85 (BP Location: Right Arm)   Pulse 60   Resp 18   Ht '5\' 3"'$  (1.6 m)   Wt 176 lb (79.8 kg)   BMI 31.18 kg/m  Gen:  WD/WN, NAD Head: [place]/AT, No temporalis wasting. Ear/Nose/Throat: Hearing grossly intact, nares w/o erythema or drainage Eyes: Conjunctiva clear. Sclera non-icteric Neck: Supple.  Trachea midline Pulmonary:  Good air movement, no use of accessory muscles.  Cardiac: RRR, no JVD Vascular:  Vessel Right Left  Radial Palpable Palpable                          PT 1+ palpable  Trace palpable  DP 2+ palpable 1+ palpable   Gastrointestinal: soft, non-tender/non-distended. No guarding/reflex.  Increased aortic impulse Musculoskeletal: M/S 5/5 throughout.  No deformity or atrophy.  Trace lower extremity edema. Neurologic: Sensation grossly intact in extremities.  Symmetrical.   Speech is fluent.  Psychiatric: Judgment intact, Mood & affect appropriate for pt's clinical situation. Dermatologic: No rashes or ulcers noted.  No cellulitis or open wounds.      Labs Recent Results (from the past 2160 hour(s))  Basic Metabolic Panel (BMET)     Status: Abnormal   Collection Time: 03/26/22 10:42 AM  Result Value Ref Range   Sodium 136 135 - 145 mmol/L   Potassium 4.1 3.5 - 5.1 mmol/L   Chloride 104 98 - 111 mmol/L   CO2 24 22 - 32 mmol/L   Glucose, Bld 118 (H) 70 - 99 mg/dL    Comment: Glucose reference range applies only to samples taken after fasting for at least 8 hours.   BUN 9 8 - 23 mg/dL   Creatinine, Ser 0.90 0.61 - 1.24 mg/dL   Calcium 9.3 8.9 - 10.3 mg/dL   GFR, Estimated >60 >60 mL/min    Comment: (NOTE) Calculated using the CKD-EPI Creatinine Equation (2021)    Anion gap 8 5 - 15    Comment: Performed at [facility], [address] [place], [place] [address]  ACTH stimulation, 3 time points     Status: None   Collection Time: 04/07/22 10:38 AM  Result Value Ref Range   Cortisol, Base <0.4 ug/dL    Comment: PERFORMED BY [facility] Performed at [facility], [address] [place], [place] [address]    Cortisol, 30 Min 4.5 ug/dL   Cortisol, 60 Min 5.9 ug/dL    Comment: Performed at [facility] [address], [place], [place] [address]  ACTH stimulation, 3 time points     Status: None   Collection Time: 04/07/22 10:40 AM  Result Value Ref Range   Cortisol, Base <0.4 ug/dL    Comment: NO NORMAL RANGE ESTABLISHED FOR THIS TEST Performed at [facility], [place] [address] [place], [place] [id number]   Basic metabolic panel     Status: Abnormal   Collection Time: 04/20/22  9:59 AM  Result Value Ref Range   Sodium 133 (L) 135 - 145 mmol/L   Potassium 3.6 3.5 - 5.1 mmol/L   Chloride 101 98 - 111 mmol/L   CO2 25 22 - 32 mmol/L   Glucose, Bld 95 70 - 99 mg/dL    Comment: Glucose reference range applies only to samples taken  after fasting for at least 8 hours.   BUN 10 8 - 23 mg/dL   Creatinine, Ser 0.88 0.61 - 1.24 mg/dL   Calcium 8.8 (L) 8.9 - 10.3 mg/dL   GFR, Estimated >60 >60 mL/min    Comment: (NOTE) Calculated using the CKD-EPI Creatinine Equation (2021)    Anion gap 7 5 - 15    Comment: Performed at [facility] [address], [place],  [address]  CBC with Differential     Status: Abnormal   Collection Time: 04/20/22  9:59 AM  Result Value Ref Range   WBC 8.0 4.0 - 10.5 K/uL   RBC 4.24 4.22 - 5.81 MIL/uL   Hemoglobin 10.6 (L) 13.0 - 17.0 g/dL   HCT 34.1 (L) 39.0 - 52.0 %   MCV 80.4 80.0 - 100.0 fL   MCH 25.0 (L) 26.0 -  34.0 pg   MCHC 31.1 30.0 - 36.0 g/dL   RDW 19.6 (H) 11.5 - 15.5 %   Platelets 336 150 - 400 K/uL   nRBC 0.0 0.0 - 0.2 %   Neutrophils Relative % 61 %   Neutro Abs 4.9 1.7 - 7.7 K/uL   Lymphocytes Relative 21 %   Lymphs Abs 1.6 0.7 - 4.0 K/uL   Monocytes Relative 10 %   Monocytes Absolute 0.8 0.1 - 1.0 K/uL   Eosinophils Relative 7 %   Eosinophils Absolute 0.6 (H) 0.0 - 0.5 K/uL   Basophils Relative 1 %   Basophils Absolute 0.1 0.0 - 0.1 K/uL   Immature Granulocytes 0 %   Abs Immature Granulocytes 0.03 0.00 - 0.07 K/uL    Comment: Performed at [facility] [address], [place], [place] [address]  Brain natriuretic peptide     Status: Abnormal   Collection Time: 04/20/22  9:59 AM  Result Value Ref Range   B Natriuretic Peptide 789.5 (H) 0.0 - 100.0 pg/mL    Comment: Performed at [facility] [address], [place], [place] [address]  Troponin I (High Sensitivity)     Status: Abnormal   Collection Time: 04/20/22  9:59 AM  Result Value Ref Range   Troponin I (High Sensitivity) 831 (HH) <18 ng/L    Comment: CRITICAL RESULT CALLED TO, READ BACK BY AND VERIFIED WITH Ivin Poot, RN [phone] 04/20/22 L. KLAR (NOTE) Elevated high sensitivity troponin I (hsTnI) values and significant  changes across serial measurements may suggest ACS but many other   chronic and acute conditions are known to elevate hsTnI results.  Refer to the "Links" section for chest pain algorithms and additional  guidance. Performed at [facility], [place] [address] [place], [place] [address]   Resp panel by RT-PCR (RSV, Flu A&B, Covid) Anterior Nasal Swab     Status: None   Collection Time: 04/20/22 12:12 PM   Specimen: Anterior Nasal Swab  Result Value Ref Range   SARS Coronavirus 2 by RT PCR NEGATIVE NEGATIVE    Comment: (NOTE) SARS-CoV-2 target nucleic acids are NOT DETECTED.  The SARS-CoV-2 RNA is generally detectable in upper respiratory specimens during the acute phase of infection. The lowest concentration of SARS-CoV-2 viral copies this assay can detect is 138 copies/mL. A negative result does not preclude SARS-Cov-2 infection and should not be used as the sole basis for treatment or other patient management decisions. A negative result may occur with  improper specimen collection/handling, submission of specimen other than nasopharyngeal swab, presence of viral mutation(s) within the areas targeted by this assay, and inadequate number of viral copies(<138 copies/mL). A negative result must be combined with clinical observations, patient history, and epidemiological information. The expected result is Negative.  Fact Sheet for Patients:  EntrepreneurPulse.com.au  Fact Sheet for Healthcare Providers:  IncredibleEmployment.be  This test is no t yet approved or cleared by the [place] FDA and  has been authorized for detection and/or diagnosis of SARS-CoV-2 by FDA under an Emergency Use Authorization (EUA). This EUA will remain  in effect (meaning this test can be used) for the duration of the COVID-19 declaration under Section 564(b)(1) of the Act, 21 U.S.C.section 360bbb-3(b)(1), unless the authorization is terminated  or revoked sooner.       Influenza A by PCR NEGATIVE NEGATIVE   Influenza B  by PCR NEGATIVE NEGATIVE    Comment: (NOTE) The Xpert Xpress SARS-CoV-2/FLU/RSV plus assay is intended  as an aid in the diagnosis of influenza from Nasopharyngeal swab specimens and should not be used as a sole basis for treatment. Nasal washings and aspirates are unacceptable for Xpert Xpress SARS-CoV-2/FLU/RSV testing.  Fact Sheet for Patients: EntrepreneurPulse.com.au  Fact Sheet for Healthcare Providers: IncredibleEmployment.be  This test is not yet approved or cleared by the [place] FDA and has been authorized for detection and/or diagnosis of SARS-CoV-2 by FDA under an Emergency Use Authorization (EUA). This EUA will remain in effect (meaning this test can be used) for the duration of the COVID-19 declaration under Section 564(b)(1) of the Act, 21 U.S.C. section 360bbb-3(b)(1), unless the authorization is terminated or revoked.     Resp Syncytial Virus by PCR NEGATIVE NEGATIVE    Comment: (NOTE) Fact Sheet for Patients: EntrepreneurPulse.com.au  Fact Sheet for Healthcare Providers: IncredibleEmployment.be  This test is not yet approved or cleared by the [place] FDA and has been authorized for detection and/or diagnosis of SARS-CoV-2 by FDA under an Emergency Use Authorization (EUA). This EUA will remain in effect (meaning this test can be used) for the duration of the COVID-19 declaration under Section 564(b)(1) of the Act, 21 U.S.C. section 360bbb-3(b)(1), unless the authorization is terminated or revoked.  Performed at [facility], [place] [address] [place], [place] [address]   Troponin I (High Sensitivity)     Status: Abnormal   Collection Time: 04/20/22  2:11 PM  Result Value Ref Range   Troponin I (High Sensitivity) 723 (HH) <18 ng/L    Comment: CRITICAL VALUE NOTED. VALUE IS CONSISTENT WITH PREVIOUSLY REPORTED/CALLED VALUE (NOTE) Elevated high sensitivity troponin I  (hsTnI) values and significant  changes across serial measurements may suggest ACS but many other  chronic and acute conditions are known to elevate hsTnI results.  Refer to the "Links" section for chest pain algorithms and additional  guidance. Performed at [facility], [place] [address] [place], [place] [id number]   Basic metabolic panel     Status: Abnormal   Collection Time: 04/21/22  3:35 AM  Result Value Ref Range   Sodium 133 (L) 135 - 145 mmol/L   Potassium 3.9 3.5 - 5.1 mmol/L   Chloride 101 98 - 111 mmol/L   CO2 24 22 - 32 mmol/L   Glucose, Bld 98 70 - 99 mg/dL    Comment: Glucose reference range applies only to samples taken after fasting for at least 8 hours.   BUN 13 8 - 23 mg/dL   Creatinine, Ser 1.00 0.61 - 1.24 mg/dL   Calcium 9.0 8.9 - 10.3 mg/dL   GFR, Estimated >60 >60 mL/min    Comment: (NOTE) Calculated using the CKD-EPI Creatinine Equation (2021)    Anion gap 8 5 - 15    Comment: Performed at [facility] [address], [place], [place] [address]  ECHOCARDIOGRAM LIMITED     Status: None   Collection Time: 04/21/22 10:48 AM  Result Value Ref Range   BP 99/65 mmHg   Single Plane A2C EF 42.5 %   Single Plane A4C EF 43.9 %   Calc EF 44.8 %   S' Lateral 4.80 cm  Magnesium     Status: Abnormal   Collection Time: 04/21/22 12:21 PM  Result Value Ref Range   Magnesium 2.7 (H) 1.7 - 2.4 mg/dL    Comment: Performed at [facility] [address] [place], [place] [address]  Lactic acid, plasma     Status: None  Collection Time: 04/21/22 12:21 PM  Result Value Ref Range   Lactic Acid, Venous 1.4 0.5 - 1.9 mmol/L    Comment: Performed at [facility], [place] [address] [place], [place] [address]  Lactic acid, plasma     Status: Abnormal   Collection Time: 04/21/22  2:19 PM  Result Value Ref Range   Lactic Acid, Venous 2.0 (HH) 0.5 - 1.9 mmol/L    Comment: CRITICAL RESULT CALLED TO, READ BACK BY AND VERIFIED WITH A ASHLEY RN 04/21/2022  1507 B NUNNERY Performed at [facility], [place] [address] [place], [place] [id number]   Basic metabolic panel     Status: Abnormal   Collection Time: 04/22/22  4:16 AM  Result Value Ref Range   Sodium 132 (L) 135 - 145 mmol/L   Potassium 3.9 3.5 - 5.1 mmol/L   Chloride 100 98 - 111 mmol/L   CO2 23 22 - 32 mmol/L   Glucose, Bld 135 (H) 70 - 99 mg/dL    Comment: Glucose reference range applies only to samples taken after fasting for at least 8 hours.   BUN 13 8 - 23 mg/dL   Creatinine, Ser 0.90 0.61 - 1.24 mg/dL   Calcium 8.9 8.9 - 10.3 mg/dL   GFR, Estimated >60 >60 mL/min    Comment: (NOTE) Calculated using the CKD-EPI Creatinine Equation (2021)    Anion gap 9 5 - 15    Comment: Performed at [facility] [address], [place], [place] [address]  CBC     Status: Abnormal   Collection Time: 04/22/22  7:30 AM  Result Value Ref Range   WBC 6.9 4.0 - 10.5 K/uL   RBC 4.45 4.22 - 5.81 MIL/uL   Hemoglobin 10.9 (L) 13.0 - 17.0 g/dL   HCT 34.3 (L) 39.0 - 52.0 %   MCV 77.1 (L) 80.0 - 100.0 fL   MCH 24.5 (L) 26.0 - 34.0 pg   MCHC 31.8 30.0 - 36.0 g/dL   RDW 19.3 (H) 11.5 - 15.5 %   Platelets 362 150 - 400 K/uL   nRBC 0.0 0.0 - 0.2 %    Comment: Performed at [facility] [address], [place], [place] [address]  POCT I-Stat EG7     Status: Abnormal   Collection Time: 04/22/22  8:03 AM  Result Value Ref Range   pH, Ven 7.448 (H) 7.25 - 7.43   pCO2, Ven 36.1 (L) 44 - 60 mmHg   pO2, Ven 29 (LL) 32 - 45 mmHg   Bicarbonate 25.0 20.0 - 28.0 mmol/L   TCO2 26 22 - 32 mmol/L   O2 Saturation 59 %   Acid-Base Excess 1.0 0.0 - 2.0 mmol/L   Sodium 134 (L) 135 - 145 mmol/L   Potassium 4.0 3.5 - 5.1 mmol/L   Calcium, Ion 1.15 1.15 - 1.40 mmol/L   HCT 34.0 (L) 39.0 - 52.0 %   Hemoglobin 11.6 (L) 13.0 - 17.0 g/dL   Sample type VENOUS    Comment NOTIFIED PHYSICIAN   POCT I-Stat EG7     Status: Abnormal   Collection Time: 04/22/22  8:03 AM  Result Value Ref Range   pH,  Ven 7.431 (H) 7.25 - 7.43   pCO2, Ven 36.0 (L) 44 - 60 mmHg   pO2, Ven 31 (LL) 32 - 45 mmHg   Bicarbonate 24.0 20.0 - 28.0 mmol/L   TCO2 25 22 - 32 mmol/L   O2 Saturation 62 %  Acid-Base Excess 0.0 0.0 - 2.0 mmol/L   Sodium 133 (L) 135 - 145 mmol/L   Potassium 4.1 3.5 - 5.1 mmol/L   Calcium, Ion 1.22 1.15 - 1.40 mmol/L   HCT 34.0 (L) 39.0 - 52.0 %   Hemoglobin 11.6 (L) 13.0 - 17.0 g/dL   Sample type VENOUS    Comment NOTIFIED PHYSICIAN     Radiology DG Chest 2 View  Result Date: 06/21/2022 CLINICAL DATA:  Shortness of breath EXAM: CHEST - 2 VIEW COMPARISON:  CT Chest 01/24/22, CXR 01/24/22 FINDINGS: No pleural effusion. No pneumothorax. Status post median sternotomy. No focal airspace opacity. Normal cardiac and mediastinal contours. Cardiac stents in place. Visualized upper abdomen is unremarkable. Vertebral body heights are maintained. IMPRESSION: No focal airspace opacity. Electronically Signed   By: Marin Roberts M.D.   On: 06/21/2022 15:35    Assessment/Plan Essential hypertension blood pressure control important in reducing the progression of atherosclerotic disease and aneurysmal growth. On appropriate oral medications.     Hyperlipidemia LDL goal <70 lipid control important in reducing the progression of atherosclerotic disease. Continue statin therapy   Coronary artery disease involving native coronary artery of native heart with angina pectoris ([place]) Has had a heart attack since his last visit  Atherosclerosis of lower extremity with claudication ([place]) ABIs today demonstrate a right ABI of 1.16 with multiphasic waveforms and a left ABI of 0.81 with monophasic waveforms.  Digital pressures are reduced bilaterally.  Also of note, on his AAA duplex bilateral iliac artery stenosis were identified.  We will be obtaining CTA of the abdomen pelvis which will also give [place] more information, but he may get some improvement in his perfusion concomitant to his abdominal aortic aneurysm  repair.  AAA (abdominal aortic aneurysm) without rupture Duplex today shows continued growth of his abdominal aortic aneurysm now measuring 4.95 cm in maximal diameter.  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  This now represents a much more serious and significant situation with risk of rupture which could be lethal.  5 cm in general cutoff for consideration for repair and he has shown steady growth over the past year of about 5 mm.  Given these findings, we will go ahead and get a CT angiogram of the abdomen pelvis and plan repair in the near future.  We had a long discussion today regarding the pathophysiology and natural history of PAD and aneurysmal disease.  We discussed the typical repair techniques that we would be able to prescribe these more precisely once we get the CT angiogram.    Leotis Pain, MD  06/22/2022 10:06 AM    This note was created with Dragon medical transcription system.  Any errors from dictation are purely unintentional

## 2022-06-22 NOTE — Assessment & Plan Note (Signed)
Duplex today shows continued growth of his abdominal aortic aneurysm now measuring 4.95 cm in maximal diameter.  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  This now represents a much more serious and significant situation with risk of rupture which could be lethal.  5 cm in general cutoff for consideration for repair and he has shown steady growth over the past year of about 5 mm.  Given these findings, we will go ahead and get a CT angiogram of the abdomen pelvis and plan repair in the near future.  We had a long discussion today regarding the pathophysiology and natural history of PAD and aneurysmal disease.  We discussed the typical repair techniques that we would be able to prescribe these more precisely once we get the CT angiogram.

## 2022-06-22 NOTE — H&P (View-Only) (Signed)
  MRN : [id number]  [name] is a 71 y.o. (01/01/1951) male who presents with chief complaint of  Chief Complaint  Patient presents with   Follow-up    Follow up  6 month AAA + ABI. see  .  History of Present Illness: Patient returns today in follow up of multiple vascular issues.  He continues to have tiredness and aching in his legs with walking in both lower extremities.  No open wounds or infection.  No fevers or chills.  No symptoms concerning for rest pain.  ABIs today demonstrate a right ABI of 1.16 with multiphasic waveforms and a left ABI of 0.81 with monophasic waveforms.  Digital pressures are reduced bilaterally. He is also followed for his abdominal aortic aneurysm.  We have been following this serially and it was 4.5 cm a year ago and 4.8 cm 6 months ago. Duplex today shows continued growth of his abdominal aortic aneurysm now measuring 4.95 cm in maximal diameter.  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  Current Outpatient Medications  Medication Sig Dispense Refill   aspirin 81 MG tablet Take 81 mg by mouth daily.     carvedilol (COREG) 3.125 MG tablet TAKE 1 TABLET BY MOUTH 2 TIMES DAILY. 180 tablet 3   clopidogrel (PLAVIX) 75 MG tablet Take 1 tablet (75 mg total) by mouth daily. 90 tablet 3   cyanocobalamin (VITAMIN B12) 1000 MCG tablet Take 1,000 mcg by mouth daily.     ezetimibe (ZETIA) 10 MG tablet Take 1 tablet (10 mg total) by mouth daily. 90 tablet 3   fludrocortisone (FLORINEF) 0.1 MG tablet Take 1 tablet (0.1 mg total) by mouth daily. 90 tablet 1   furosemide (LASIX) 40 MG tablet Take 1 tablet (40 mg total) by mouth daily. Hold if weight under 163# 1 tablet 0   gabapentin (NEURONTIN) 100 MG capsule Take 1 capsule (100 mg total) by mouth 2 (two) times daily. 180 capsule 3   hydrocortisone (CORTEF) 10 MG tablet Take 1 tablet daily at 8 AM, 1 tablet daily at noon 200 tablet 1    mirtazapine (REMERON) 15 MG tablet TAKE 1 TABLET BY MOUTH AT BEDTIME 90 tablet 3   nitroGLYCERIN (NITROSTAT) 0.4 MG SL tablet PLACE 1 TABLET UNDER THE TONGUE EVERY 5 MINUTES AS NEEDED FOR CHEST PAIN. (Patient taking differently: Place 0.4 mg under the tongue every 5 (five) minutes as needed for chest pain (call 911).) 25 tablet 2   pantoprazole (PROTONIX) 40 MG tablet TAKE 1 TABLET BY MOUTH TWICE DAILY 180 tablet 3   ranolazine (RANEXA) 500 MG 12 hr tablet Take 1 tablet (500 mg total) by mouth 2 (two) times daily. 180 tablet 3   rosuvastatin (CRESTOR) 40 MG tablet Take 1 tablet (40 mg total) by mouth daily. 90 tablet 3   spironolactone (ALDACTONE) 25 MG tablet Take 0.5 tablets (12.5 mg total) by mouth daily. 45 tablet 3   No current facility-administered medications for this visit.    Past Medical History:  Diagnosis Date   AAA (abdominal aortic aneurysm) (HCC)    a. Duplex 01/2012: stable infrarenal saccular AAA at 3.3cm x 3.2xm (f/u recommended 01/2013 per Dr. Gollan)   Alcohol abuse    CAD (coronary artery disease)    a. 1995 s/p CABG x 4: LIMA->LAD, VG->RI (known to be occluded), VG->AM->PDA;  b. 05/2002 Inf STEMI: VG->AM->PDA 100% treated w/ 2 BMS complicated by acute thrombosis req   3 BMS;  c. 07/2003 DES to  LAD & LCX (VG's to PDA & RI 100%);  d. 12/2010 Acute [place] ([place]): DES to LCX & LM , LIMA ok,;  e.12/2011 Cath: LM/LCX stents ok , LIMA patent.   Cardiomyopathy (HCC)    a. EF 35% by cath 2015.   Diverticulitis    1/06 Diverticulitis--CT of pelvis--diffuse sigmoid divertic   Dyspnea    GERD (gastroesophageal reflux disease)    Hyperlipidemia    Hypertension    Myocardial infarction (HCC)    PAD (peripheral artery disease) (HCC)    a. external iliac and mesenteric stenosis noted by noninvasive imaging.   Renal artery stenosis (HCC)    a. 03/2011 PTA and stenting of L RA. b. last duplex 2016 with stable 1-59% bilateral RAS, incidental >50% R EIA stenosis    Past Surgical History:   Procedure Laterality Date   ANGIOPLASTY  1997   CARDIAC CATHETERIZATION     8/09  Cath--vein graft occlusions which are old--no acute changes   CARDIAC CATHETERIZATION  11/13/2010   stent x 2 @ [place]   CARDIAC CATHETERIZATION  04/05/2014   stent placement    CARDIAC CATHETERIZATION  10/28/2017   CLOSED REDUCTION SHOULDER DISLOCATION     COLONOSCOPY WITH PROPOFOL N/A 12/22/2018   Procedure: COLONOSCOPY WITH PROPOFOL;  Surgeon: Anna, Kiran, MD;  Location: [facility] ENDOSCOPY;  Service: Gastroenterology;  Laterality: N/A;   CORONARY ANGIOPLASTY  04/05/2014   stent placement OM 1   CORONARY ARTERY BYPASS GRAFT     CORONARY STENT PLACEMENT  7/12   2 stents--Promus element plus (everolimus eluting)--[facility] in [place]   INTRAVASCULAR PRESSURE WIRE/FFR STUDY N/A 05/13/2021   Procedure: INTRAVASCULAR PRESSURE WIRE/FFR STUDY;  Surgeon: Thukkani, Arun K, MD;  [facility];  Service: Cardiovascular;  Laterality: N/A;   LEFT HEART CATH AND CORONARY ANGIOGRAPHY N/A 05/13/2021   Procedure: LEFT HEART CATH AND CORONARY ANGIOGRAPHY;  Surgeon: Thukkani, Arun K, MD;  [facility];  Service: Cardiovascular;  Laterality: N/A;   LEFT HEART CATH AND CORS/GRAFTS ANGIOGRAPHY N/A 10/28/2017   Procedure: LEFT HEART CATH AND CORS/GRAFTS ANGIOGRAPHY;  Surgeon: Harding, David W, MD;  [facility];  Service: Cardiovascular;  Laterality: N/A;   LEFT HEART CATHETERIZATION WITH CORONARY/GRAFT ANGIOGRAM N/A 01/04/2012   Procedure: LEFT HEART CATHETERIZATION WITH CORONARY/GRAFT ANGIOGRAM;  Surgeon: Christopher D McAlhany, MD;  [facility];  Service: Cardiovascular;  Laterality: N/A;   LEFT HEART CATHETERIZATION WITH CORONARY/GRAFT ANGIOGRAM N/A 04/05/2014   Procedure: LEFT HEART CATHETERIZATION WITH CORONARY/GRAFT ANGIOGRAM;  Surgeon: Jayadeep S Varanasi, MD;  [facility];  Service: Cardiovascular;  Laterality: N/A;   PERCUTANEOUS CORONARY STENT  INTERVENTION (PCI-S)  04/05/2014   Procedure: PERCUTANEOUS CORONARY STENT INTERVENTION (PCI-S);  Surgeon: Jayadeep S Varanasi, MD;  [facility];  Service: Cardiovascular;;  OM1   RENAL ANGIOGRAM N/A 04/16/2011   Procedure: RENAL ANGIOGRAM;  Surgeon: Christopher D McAlhany, MD;  [facility];  Service: Cardiovascular;  Laterality: N/A;   RENAL ARTERY STENT  03/2011 ?   RIGHT HEART CATH N/A 04/22/2022   Procedure: RIGHT HEART CATH;  Surgeon: Berry, Jonathan J, MD;  [facility];  Service: Cardiovascular;  Laterality: N/A;     Social History   Tobacco Use   Smoking status: Former    Packs/day: 3.00    Years: 25.00    Total pack years: 75.00    Types: Cigarettes      Quit date: 05/28/1993    Years since quitting: 29.0    Passive exposure: Past   Smokeless tobacco: Never  Vaping Use   Vaping Use: Never used  Substance Use Topics   Alcohol use: Yes    Alcohol/week: 42.0 standard drinks of alcohol    Types: 42 Cans of beer per week   Drug use: No      Family History  Problem Relation Age of Onset   Coronary artery disease Mother        Died MI age 80   Hypertension Mother    Heart attack Mother    Coronary artery disease Sister        Living   Coronary artery disease Brother        Living   Coronary artery disease Father        Died MI age 60   Heart attack Father    Heart attack Maternal Grandfather    Diabetes Neg Hx    Cancer Neg Hx        prostate or colon     Allergies  Allergen Reactions   Metoprolol Tartrate Hives and Itching   Quinolones Other (See Comments)    Contraindicated with AAA   Metoclopramide Nausea Only    REVIEW OF SYSTEMS (Negative unless checked)   Constitutional: []Weight loss  []Fever  []Chills Cardiac: []Chest pain   []Chest pressure   [x]Palpitations   []Shortness of breath when laying flat   []Shortness of breath at rest   [x]Shortness of breath with exertion. Vascular:  []Pain in legs with walking    []Pain in legs at rest   []Pain in legs when laying flat   []Claudication   []Pain in feet when walking  []Pain in feet at rest  []Pain in feet when laying flat   []History of DVT   []Phlebitis   []Swelling in legs   []Varicose veins   []Non-healing ulcers Pulmonary:   []Uses home oxygen   []Productive cough   []Hemoptysis   []Wheeze  [x]COPD   []Asthma Neurologic:  []Dizziness  []Blackouts   []Seizures   []History of stroke   []History of TIA  []Aphasia   []Temporary blindness   []Dysphagia   []Weakness or numbness in arms   []Weakness or numbness in legs Musculoskeletal:  [x]Arthritis   []Joint swelling   [x]Joint pain   []Low back pain Hematologic:  []Easy bruising  []Easy bleeding   []Hypercoagulable state   []Anemic  []Hepatitis Gastrointestinal:  []Blood in stool   []Vomiting blood  [x]Gastroesophageal reflux/heartburn   []Abdominal pain Genitourinary:  []Chronic kidney disease   []Difficult urination  []Frequent urination  []Burning with urination   []Hematuria Skin:  []Rashes   []Ulcers   []Wounds Psychological:  []History of anxiety   [] History of major depression.  Physical Examination  BP 131/85 (BP Location: Right Arm)   Pulse 60   Resp 18   Ht 5' 3" (1.6 m)   Wt 176 lb (79.8 kg)   BMI 31.18 kg/m  Gen:  WD/WN, NAD Head: [place]/AT, No temporalis wasting. Ear/Nose/Throat: Hearing grossly intact, nares w/o erythema or drainage Eyes: Conjunctiva clear. Sclera non-icteric Neck: Supple.  Trachea midline Pulmonary:  Good air movement, no use of accessory muscles.  Cardiac: RRR, no JVD Vascular:  Vessel Right Left  Radial Palpable Palpable                          PT 1+ palpable   Trace palpable  DP 2+ palpable 1+ palpable   Gastrointestinal: soft, non-tender/non-distended. No guarding/reflex.  Increased aortic impulse Musculoskeletal: M/S 5/5 throughout.  No deformity or atrophy.  Trace lower extremity edema. Neurologic: Sensation grossly intact in extremities.  Symmetrical.   Speech is fluent.  Psychiatric: Judgment intact, Mood & affect appropriate for pt's clinical situation. Dermatologic: No rashes or ulcers noted.  No cellulitis or open wounds.      Labs Recent Results (from the past 2160 hour(s))  Basic Metabolic Panel (BMET)     Status: Abnormal   Collection Time: 03/26/22 10:42 AM  Result Value Ref Range   Sodium 136 135 - 145 mmol/L   Potassium 4.1 3.5 - 5.1 mmol/L   Chloride 104 98 - 111 mmol/L   CO2 24 22 - 32 mmol/L   Glucose, Bld 118 (H) 70 - 99 mg/dL    Comment: Glucose reference range applies only to samples taken after fasting for at least 8 hours.   BUN 9 8 - 23 mg/dL   Creatinine, Ser 0.90 0.61 - 1.24 mg/dL   Calcium 9.3 8.9 - 10.3 mg/dL   GFR, Estimated >60 >60 mL/min    Comment: (NOTE) Calculated using the CKD-EPI Creatinine Equation (2021)    Anion gap 8 5 - 15    Comment: Performed at [facility], [address] [place], [place] [address]  ACTH stimulation, 3 time points     Status: None   Collection Time: 04/07/22 10:38 AM  Result Value Ref Range   Cortisol, Base <0.4 ug/dL    Comment: PERFORMED BY [facility] Performed at [facility], [address] [place], [place] [address]    Cortisol, 30 Min 4.5 ug/dL   Cortisol, 60 Min 5.9 ug/dL    Comment: Performed at [facility], [address] [place], [place] [address]  ACTH stimulation, 3 time points     Status: None   Collection Time: 04/07/22 10:40 AM  Result Value Ref Range   Cortisol, Base <0.4 ug/dL    Comment: NO NORMAL RANGE ESTABLISHED FOR THIS TEST Performed at [facility], [address] [place], [place] [address]   Basic metabolic panel     Status: Abnormal   Collection Time: 04/20/22  9:59 AM  Result Value Ref Range   Sodium 133 (L) 135 - 145 mmol/L   Potassium 3.6 3.5 - 5.1 mmol/L   Chloride 101 98 - 111 mmol/L   CO2 25 22 - 32 mmol/L   Glucose, Bld 95 70 - 99 mg/dL    Comment: Glucose reference range applies only to samples taken  after fasting for at least 8 hours.   BUN 10 8 - 23 mg/dL   Creatinine, Ser 0.88 0.61 - 1.24 mg/dL   Calcium 8.8 (L) 8.9 - 10.3 mg/dL   GFR, Estimated >60 >60 mL/min    Comment: (NOTE) Calculated using the CKD-EPI Creatinine Equation (2021)    Anion gap 7 5 - 15    Comment: Performed at [facility], [address] [place], [place] [address]  CBC with Differential     Status: Abnormal   Collection Time: 04/20/22  9:59 AM  Result Value Ref Range   WBC 8.0 4.0 - 10.5 K/uL   RBC 4.24 4.22 - 5.81 MIL/uL   Hemoglobin 10.6 (L) 13.0 - 17.0 g/dL   HCT 34.1 (L) 39.0 - 52.0 %   MCV 80.4 80.0 - 100.0 fL   MCH 25.0 (L) 26.0 -   34.0 pg   MCHC 31.1 30.0 - 36.0 g/dL   RDW 19.6 (H) 11.5 - 15.5 %   Platelets 336 150 - 400 K/uL   nRBC 0.0 0.0 - 0.2 %   Neutrophils Relative % 61 %   Neutro Abs 4.9 1.7 - 7.7 K/uL   Lymphocytes Relative 21 %   Lymphs Abs 1.6 0.7 - 4.0 K/uL   Monocytes Relative 10 %   Monocytes Absolute 0.8 0.1 - 1.0 K/uL   Eosinophils Relative 7 %   Eosinophils Absolute 0.6 (H) 0.0 - 0.5 K/uL   Basophils Relative 1 %   Basophils Absolute 0.1 0.0 - 0.1 K/uL   Immature Granulocytes 0 %   Abs Immature Granulocytes 0.03 0.00 - 0.07 K/uL    Comment: Performed at [facility], [address] [place], [place] [address]  Brain natriuretic peptide     Status: Abnormal   Collection Time: 04/20/22  9:59 AM  Result Value Ref Range   B Natriuretic Peptide 789.5 (H) 0.0 - 100.0 pg/mL    Comment: Performed at [facility], [address] [place], [place] [address]  Troponin I (High Sensitivity)     Status: Abnormal   Collection Time: 04/20/22  9:59 AM  Result Value Ref Range   Troponin I (High Sensitivity) 831 (HH) <18 ng/L    Comment: CRITICAL RESULT CALLED TO, READ BACK BY AND VERIFIED WITH M. BARBER, RN 1127 04/20/22 L. KLAR (NOTE) Elevated high sensitivity troponin I (hsTnI) values and significant  changes across serial measurements may suggest ACS but many other   chronic and acute conditions are known to elevate hsTnI results.  Refer to the "Links" section for chest pain algorithms and additional  guidance. Performed at [facility], [address] [place], [place] [address]   Resp panel by RT-PCR (RSV, Flu A&B, Covid) Anterior Nasal Swab     Status: None   Collection Time: 04/20/22 12:12 PM   Specimen: Anterior Nasal Swab  Result Value Ref Range   SARS Coronavirus 2 by RT PCR NEGATIVE NEGATIVE    Comment: (NOTE) SARS-CoV-2 target nucleic acids are NOT DETECTED.  The SARS-CoV-2 RNA is generally detectable in upper respiratory specimens during the acute phase of infection. The lowest concentration of SARS-CoV-2 viral copies this assay can detect is 138 copies/mL. A negative result does not preclude SARS-Cov-2 infection and should not be used as the sole basis for treatment or other patient management decisions. A negative result may occur with  improper specimen collection/handling, submission of specimen other than nasopharyngeal swab, presence of viral mutation(s) within the areas targeted by this assay, and inadequate number of viral copies(<138 copies/mL). A negative result must be combined with clinical observations, patient history, and epidemiological information. The expected result is Negative.  Fact Sheet for Patients:  https://www.fda.gov/media/152166/download  Fact Sheet for Healthcare Providers:  https://www.fda.gov/media/152162/download  This test is no t yet approved or cleared by the [place] FDA and  has been authorized for detection and/or diagnosis of SARS-CoV-2 by FDA under an Emergency Use Authorization (EUA). This EUA will remain  in effect (meaning this test can be used) for the duration of the COVID-19 declaration under Section 564(b)(1) of the Act, 21 U.S.C.section 360bbb-3(b)(1), unless the authorization is terminated  or revoked sooner.       Influenza A by PCR NEGATIVE NEGATIVE   Influenza B  by PCR NEGATIVE NEGATIVE    Comment: (NOTE) The Xpert Xpress SARS-CoV-2/FLU/RSV plus assay is intended   as an aid in the diagnosis of influenza from Nasopharyngeal swab specimens and should not be used as a sole basis for treatment. Nasal washings and aspirates are unacceptable for Xpert Xpress SARS-CoV-2/FLU/RSV testing.  Fact Sheet for Patients: https://www.fda.gov/media/152166/download  Fact Sheet for Healthcare Providers: https://www.fda.gov/media/152162/download  This test is not yet approved or cleared by the [place] FDA and has been authorized for detection and/or diagnosis of SARS-CoV-2 by FDA under an Emergency Use Authorization (EUA). This EUA will remain in effect (meaning this test can be used) for the duration of the COVID-19 declaration under Section 564(b)(1) of the Act, 21 U.S.C. section 360bbb-3(b)(1), unless the authorization is terminated or revoked.     Resp Syncytial Virus by PCR NEGATIVE NEGATIVE    Comment: (NOTE) Fact Sheet for Patients: https://www.fda.gov/media/152166/download  Fact Sheet for Healthcare Providers: https://www.fda.gov/media/152162/download  This test is not yet approved or cleared by the [place] FDA and has been authorized for detection and/or diagnosis of SARS-CoV-2 by FDA under an Emergency Use Authorization (EUA). This EUA will remain in effect (meaning this test can be used) for the duration of the COVID-19 declaration under Section 564(b)(1) of the Act, 21 U.S.C. section 360bbb-3(b)(1), unless the authorization is terminated or revoked.  Performed at [facility], [address] [place], [place] [address]   Troponin I (High Sensitivity)     Status: Abnormal   Collection Time: 04/20/22  2:11 PM  Result Value Ref Range   Troponin I (High Sensitivity) 723 (HH) <18 ng/L    Comment: CRITICAL VALUE NOTED. VALUE IS CONSISTENT WITH PREVIOUSLY REPORTED/CALLED VALUE (NOTE) Elevated high sensitivity troponin I  (hsTnI) values and significant  changes across serial measurements may suggest ACS but many other  chronic and acute conditions are known to elevate hsTnI results.  Refer to the "Links" section for chest pain algorithms and additional  guidance. Performed at [facility], [address] [place], [place] [address]   Basic metabolic panel     Status: Abnormal   Collection Time: 04/21/22  3:35 AM  Result Value Ref Range   Sodium 133 (L) 135 - 145 mmol/L   Potassium 3.9 3.5 - 5.1 mmol/L   Chloride 101 98 - 111 mmol/L   CO2 24 22 - 32 mmol/L   Glucose, Bld 98 70 - 99 mg/dL    Comment: Glucose reference range applies only to samples taken after fasting for at least 8 hours.   BUN 13 8 - 23 mg/dL   Creatinine, Ser 1.00 0.61 - 1.24 mg/dL   Calcium 9.0 8.9 - 10.3 mg/dL   GFR, Estimated >60 >60 mL/min    Comment: (NOTE) Calculated using the CKD-EPI Creatinine Equation (2021)    Anion gap 8 5 - 15    Comment: Performed at [facility], [address] [place], [place] [address]  ECHOCARDIOGRAM LIMITED     Status: None   Collection Time: 04/21/22 10:48 AM  Result Value Ref Range   BP 99/65 mmHg   Single Plane A2C EF 42.5 %   Single Plane A4C EF 43.9 %   Calc EF 44.8 %   S' Lateral 4.80 cm  Magnesium     Status: Abnormal   Collection Time: 04/21/22 12:21 PM  Result Value Ref Range   Magnesium 2.7 (H) 1.7 - 2.4 mg/dL    Comment: Performed at [facility], [address] [place], [place] [address]  Lactic acid, plasma     Status: None     Collection Time: 04/21/22 12:21 PM  Result Value Ref Range   Lactic Acid, Venous 1.4 0.5 - 1.9 mmol/L    Comment: Performed at [facility], [address] [place], [place] [address]  Lactic acid, plasma     Status: Abnormal   Collection Time: 04/21/22  2:19 PM  Result Value Ref Range   Lactic Acid, Venous 2.0 (HH) 0.5 - 1.9 mmol/L    Comment: CRITICAL RESULT CALLED TO, READ BACK BY AND VERIFIED WITH A ASHLEY RN 04/21/2022  1507 B NUNNERY Performed at [facility], [address] [place], [place] [address]   Basic metabolic panel     Status: Abnormal   Collection Time: 04/22/22  4:16 AM  Result Value Ref Range   Sodium 132 (L) 135 - 145 mmol/L   Potassium 3.9 3.5 - 5.1 mmol/L   Chloride 100 98 - 111 mmol/L   CO2 23 22 - 32 mmol/L   Glucose, Bld 135 (H) 70 - 99 mg/dL    Comment: Glucose reference range applies only to samples taken after fasting for at least 8 hours.   BUN 13 8 - 23 mg/dL   Creatinine, Ser 0.90 0.61 - 1.24 mg/dL   Calcium 8.9 8.9 - 10.3 mg/dL   GFR, Estimated >60 >60 mL/min    Comment: (NOTE) Calculated using the CKD-EPI Creatinine Equation (2021)    Anion gap 9 5 - 15    Comment: Performed at [facility], [address] [place], [place] [address]  CBC     Status: Abnormal   Collection Time: 04/22/22  7:30 AM  Result Value Ref Range   WBC 6.9 4.0 - 10.5 K/uL   RBC 4.45 4.22 - 5.81 MIL/uL   Hemoglobin 10.9 (L) 13.0 - 17.0 g/dL   HCT 34.3 (L) 39.0 - 52.0 %   MCV 77.1 (L) 80.0 - 100.0 fL   MCH 24.5 (L) 26.0 - 34.0 pg   MCHC 31.8 30.0 - 36.0 g/dL   RDW 19.3 (H) 11.5 - 15.5 %   Platelets 362 150 - 400 K/uL   nRBC 0.0 0.0 - 0.2 %    Comment: Performed at [facility], [address] [place], [place] [address]  POCT I-Stat EG7     Status: Abnormal   Collection Time: 04/22/22  8:03 AM  Result Value Ref Range   pH, Ven 7.448 (H) 7.25 - 7.43   pCO2, Ven 36.1 (L) 44 - 60 mmHg   pO2, Ven 29 (LL) 32 - 45 mmHg   Bicarbonate 25.0 20.0 - 28.0 mmol/L   TCO2 26 22 - 32 mmol/L   O2 Saturation 59 %   Acid-Base Excess 1.0 0.0 - 2.0 mmol/L   Sodium 134 (L) 135 - 145 mmol/L   Potassium 4.0 3.5 - 5.1 mmol/L   Calcium, Ion 1.15 1.15 - 1.40 mmol/L   HCT 34.0 (L) 39.0 - 52.0 %   Hemoglobin 11.6 (L) 13.0 - 17.0 g/dL   Sample type VENOUS    Comment NOTIFIED PHYSICIAN   POCT I-Stat EG7     Status: Abnormal   Collection Time: 04/22/22  8:03 AM  Result Value Ref Range   pH,  Ven 7.431 (H) 7.25 - 7.43   pCO2, Ven 36.0 (L) 44 - 60 mmHg   pO2, Ven 31 (LL) 32 - 45 mmHg   Bicarbonate 24.0 20.0 - 28.0 mmol/L   TCO2 25 22 - 32 mmol/L   O2 Saturation 62 %     Acid-Base Excess 0.0 0.0 - 2.0 mmol/L   Sodium 133 (L) 135 - 145 mmol/L   Potassium 4.1 3.5 - 5.1 mmol/L   Calcium, Ion 1.22 1.15 - 1.40 mmol/L   HCT 34.0 (L) 39.0 - 52.0 %   Hemoglobin 11.6 (L) 13.0 - 17.0 g/dL   Sample type VENOUS    Comment NOTIFIED PHYSICIAN     Radiology DG Chest 2 View  Result Date: 06/21/2022 CLINICAL DATA:  Shortness of breath EXAM: CHEST - 2 VIEW COMPARISON:  CT Chest 01/24/22, CXR 01/24/22 FINDINGS: No pleural effusion. No pneumothorax. Status post median sternotomy. No focal airspace opacity. Normal cardiac and mediastinal contours. Cardiac stents in place. Visualized upper abdomen is unremarkable. Vertebral body heights are maintained. IMPRESSION: No focal airspace opacity. Electronically Signed   By: Hemant  Desai M.D.   On: 06/21/2022 15:35    Assessment/Plan Essential hypertension blood pressure control important in reducing the progression of atherosclerotic disease and aneurysmal growth. On appropriate oral medications.     Hyperlipidemia LDL goal <70 lipid control important in reducing the progression of atherosclerotic disease. 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  Digital pressures are reduced bilaterally.  Also of note, on his AAA duplex bilateral iliac artery stenosis were identified.  We will be obtaining CTA of the abdomen pelvis which will also give us more information, but he may get some improvement in his perfusion concomitant to his abdominal aortic aneurysm  repair.  AAA (abdominal aortic aneurysm) without rupture Duplex today shows continued growth of his abdominal aortic aneurysm now measuring 4.95 cm in maximal diameter.  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  This now represents a much more serious and significant situation with risk of rupture which could be lethal.  5 cm in general cutoff for consideration for repair and he has shown steady growth over the past year of about 5 mm.  Given these findings, we will go ahead and get a CT angiogram of the abdomen pelvis and plan repair in the near future.  We had a long discussion today regarding the pathophysiology and natural history of PAD and aneurysmal disease.  We discussed the typical repair techniques that we would be able to prescribe these more precisely once we get the CT angiogram.    Linlee Cromie, MD  06/22/2022 10:06 AM    This note was created with Dragon medical transcription system.  Any errors from dictation are purely unintentional 

## 2022-06-23 LAB — VAS US ABI WITH/WO TBI
Left ABI: 0.81
Right ABI: 1.16

## 2022-06-24 ENCOUNTER — Ambulatory Visit: Payer: Medicare Other | Admitting: Internal Medicine

## 2022-07-01 ENCOUNTER — Ambulatory Visit
Admission: RE | Admit: 2022-07-01 | Discharge: 2022-07-01 | Disposition: A | Discharge status: Home / Self Care | Payer: Medicare Other | Source: Ambulatory Visit | Attending: Vascular Surgery | Admitting: Vascular Surgery

## 2022-07-01 ENCOUNTER — Telehealth: Payer: Self-pay

## 2022-07-01 ENCOUNTER — Ambulatory Visit: Payer: Self-pay

## 2022-07-01 DIAGNOSIS — I713 Abdominal aortic aneurysm, ruptured, unspecified: Secondary | ICD-10-CM

## 2022-07-01 DIAGNOSIS — I7143 Infrarenal abdominal aortic aneurysm, without rupture: Secondary | ICD-10-CM | POA: Diagnosis not present

## 2022-07-01 MED ORDER — IOHEXOL 350 MG/ML SOLN
100.0000 mL | Freq: Once | INTRAVENOUS | Status: AC | PRN
  Administered 2022-07-01: 75 mL via INTRAVENOUS

## 2022-07-01 NOTE — Patient Outreach (Signed)
  Care Coordination   Follow Up Visit Note   07/01/2022 Name: [name] MRN: [id number] DOB: 20-Aug-1950  [name] is a 72 y.o. year old male who sees [name] Carbon, MD for primary care. I spoke with  [name] and wife [name] by phone today.  What matters to the patients health and wellness today?  Patient reports having recent follow up visit with primary care provider.  He states he was seen because he was having an increase in shortness of breath.  Patient states provider advised him to start taking his lasix daily.  Patient states he is doing very well now. He reports being more active outside and taking the lasix as recommended.  Patient reports having follow up visit with vascular surgeon. He states surgeon has discussed patient having procedure due to enlarged aneurysm.  Patient states he is scheduled to have a CT scan today for the aneurysm.  Patient states he is scheduled to have a follow up visit with his cardiologist on 07/05/22.  Patient denies having any increase in heart failure symptoms. Declined additional written education at this time on heart failure action plan. Patient states he had a visit with his primary care provider practice pharmacist [name].  He reports they discussed him starting on Farxiga.  He states they discussed medication assistance for farxiga since he is unable to afford it.    Goals Addressed             This Visit's Progress    Post hospital follow up for heart failure       Interventions Today    Flowsheet Row Most Recent Value  Chronic Disease   Chronic disease during today's visit Congestive Heart Failure (CHF)  [evaluation of current treatment plan related to  heart failure and patients adherence to plan as established by provider.]  General Interventions   General Interventions Discussed/Reviewed General Interventions Reviewed, Doctor Visits  Doctor Visits Discussed/Reviewed Doctor Visits Reviewed  [Discussed  patients recent primary provider and vascular surgeon visit.  patient advised to inform cardiologist at upcoming appointment on 07/05/22 of pending aneurysm procedure. Reviewed scheduled/ upcoming provider appointments]  Education Interventions   Education Provided Provided Education  Provided Verbal Education On Other  [Discussed heart failure action plan in detail.  Confirmed patients current status related to heart failure action plan zone.  Offered to send patient education article on heart failure action plan. Reviewed signs/ symptoms of heart failure.]  Pharmacy Interventions   Pharmacy Dicussed/Reviewed Pharmacy Topics Reviewed  [medications reviewed and compliance discussed.  Sent message to [name], practice pharmacist to call patient at his request regarding [place] questions.  Discussed recent medication adjustments.]                SDOH assessments and interventions completed:  No     Care Coordination Interventions:  Yes, provided   Follow up plan: Follow up call scheduled for 07/19/22     Encounter Outcome:  Pt. Visit Completed   [name] Plowman RN,BSN,CCM [place] [phone] direct line

## 2022-07-01 NOTE — Patient Instructions (Signed)
Visit Information  Thank you for taking time to visit with me today. Please don't hesitate to contact me if I can be of assistance to you.   Following are the goals we discussed today:  Continue to weigh daily and record Notify provider for signs/ symptoms of heart failure Continue to take medications as prescribed and expect follow up call from pharmacist Continue to stay active and follow low salt diet.    Our next appointment is by telephone on 07/19/22 at 10:30 am  Please call the care guide team at [phone] if you need to cancel or reschedule your appointment.   If you are experiencing a Mental Health or [place] or need someone to talk to, please call the Suicide and Crisis Lifeline: 988 call 1-800-273-TALK (toll free, 24 hour hotline)  Patient verbalizes understanding of instructions and care plan provided today and agrees to view in [place]. Active MyChart status and patient understanding of how to access instructions and care plan via MyChart confirmed with patient.     Quinn Plowman RN,BSN,CCM [place] Coordination [phone] direct line

## 2022-07-01 NOTE — Progress Notes (Signed)
Care Management & Coordination Services Pharmacy Team  Reason for Encounter: Appointment Reminder  Contacted patient to confirm telephone appointment with Charlene Brooke, PharmD on 07/06/2022 at 11:45.  Spoke with patient on 07/02/2022   Do you have any problems getting your medications? No  What is your top health concern you would like to discuss at your upcoming visit? Not at this time  Have you seen any other providers since your last visit with PCP? Yes  Star Rating Drugs:  Medication:                Last Fill:         Day Supply Rosuvastatin 40 mg    05/10/2022      90   Care Gaps: Annual wellness visit in last year? Yes 02/24/2022   Charlene Brooke, PharmD notified   Marijean Niemann, [place] Pharmacy Assistant [phone]

## 2022-07-04 NOTE — Progress Notes (Addendum)
Date:  07/05/2022   ID:  [name], DOB Nov 07, 1950, MRN [id number]  Patient Location:  [address] [place] [address]   Provider location:   [facility], [place] office  PCP:  Venia Carbon, MD  Cardiologist:  Arvid Right [facility]   Chief Complaint  Patient presents with   6 month follow up     Patient c/o shortness of breath. Medications reviewed by the patient verbally.     History of Present Illness:    [name] is a 72 y.o. male  past medical history of coronary artery disease, bypass surgery in 1995,  stenting at [facility] in February 2004 for MI with a 3.0 x 25 mm and 4.5 x 16 mm Monorail ( location uncertain), also 4.5 x 18 mm stent placed to the mid RCA, followup with repeat stenting in April 2005 to the proximal LAD with a Taxus stent 2.5 x 8 mm, and stenting to the proximal left circumflex with a 2.5 x 12 mm Taxus, with  stent placed November 13, 2010 with a 4.0 x 9 mm stent placed to the left main. renal artery stent.  smoker though stopped in 1995. Prior to that he smoked 3 packs per day. Smoked for approximately 25-28 years, 3 ppd, quit 1995 4.2 cm AAA on CT scan 10/2019 Echo December 2023 EF 30 to 35% Adrenal insufficiency on fludrocortisone and hydrocortisone He presents for routine followup of his coronary artery disease.  Last seen in clinic by myself January 24 Stress test October 2023 prior MI, predominantly fixed defect inferior, anteroseptal wall  Reports that last month had increasing SOB, leg edema on left Seen by primary care, lasix increased up to 40 mg daily on Jun 21, 2022 Weight seems to have stabilized around 170 pounds  Talking with pharmacy to start Farxiga,  too expensive Might be able to get a grant  Labs reviewed: A1C 6.8 LDL 50s, total chol 136  CT scan ABD fusiform infrarenal abdominal aortic aneurysm measuring up to 4.7 cm Scheduling for surgery  EKG personally  reviewed by myself on todays visit Normal sinus rhythm rate 64 bpm nonspecific ST abnormality, old inferior MI  Other past medical history reviewed In the hospital April 20, 2022 non-STEMI, CHF Acute shortness of breath from home Ejection fraction 30 to 35%, hypokinesis inferior, inferolateral, septal wall Right heart catheterization after diuresis showing normal pressures Discharged on Lasix 40 as needed [place] to have indulged on salty foods over Christmas holiday "Cheese doodle kick at that time" BNP >700 TNT 700-800, no ischemic workup  Other past medical history reviewed Followed by vascular Known LE arterial disease on left, moderate  Other past medical hx reviewed NSTEMI: 04/2021, hospital records reviewed Cath: High-grade proximal LAD lesion which perfuses a large septal; the LIMA to the LAD does not backfill just septal.  Failed PCI due to inability to cross and likely represents a chronic total occlusion. 2.  Moderate in-stent restenosis of proximal left circumflex with RFR of 0.92; PCI was deferred. 3.  Sequential vein graft to OM to PDA, vein graft to OM,, and native right coronary artery were not imaged as they are known occluded.  Echo reviewed  Left ventricular ejection fraction, by estimation, is 45 to 50%  Carotid ultrasound October 2021 with 40 to 59% disease/stenosis bilaterally,  Cardiac cath 10/25/2017 No stents Known Severe Multi-vessel CAD: known CTO of ostRCA, mLAD & SVG-AM-PDA, SVG-OM. Widely patent LIMA-LAD -  retograde fills to 100% CTO, antegrade flow brisk to the Apex. Prox LM ~40% (stable lesion just prior to the stent) followed by widely patent stents running from pLM-ost-proxCx-OM with brisk flow in both the distal OM & AV Groove Cx --> collaterals to RPL system. Mldly reduced LVEF with basal-mid Inferior Akinesis. Normal/Low LVEDP.   Past Medical History:  Diagnosis Date   AAA (abdominal aortic aneurysm) ([place])    a. Duplex 01/2012: stable  infrarenal saccular AAA at 3.3cm x 3.2xm (f/u recommended 01/2013 per Dr. Rockey Situ)   Alcohol abuse    CAD (coronary artery disease)    a. 1995 s/p CABG x 4: LIMA->LAD, VG->RI (known to be occluded), VG->AM->PDA;  b. 05/2002 Inf STEMI: VG->AM->PDA 100% treated w/ 2 BMS complicated by acute thrombosis req 3 BMS;  c. 07/2003 DES to  LAD & LCX (VG's to PDA & RI 100%);  d. 12/2010 Acute MI ([place]): DES to LCX & LM , LIMA ok,;  e.12/2011 Cath: LM/LCX stents ok , LIMA patent.   Cardiomyopathy ([place])    a. EF 35% by cath 2015.   Diverticulitis    1/06 Diverticulitis--CT of pelvis--diffuse sigmoid divertic   Dyspnea    GERD (gastroesophageal reflux disease)    Hyperlipidemia    Hypertension    Myocardial infarction [facility])    PAD (peripheral artery disease) ([place])    a. external iliac and mesenteric stenosis noted by noninvasive imaging.   Renal artery stenosis ([place])    a. 03/2011 PTA and stenting of L RA. b. last duplex 2016 with stable 1-59% bilateral RAS, incidental >50% R EIA stenosis   Past Surgical History:  Procedure Laterality Date   [place]     8/09  Cath--vein graft occlusions which are old--no acute changes   CARDIAC CATHETERIZATION  11/13/2010   stent x 2 @ [place]   CARDIAC CATHETERIZATION  04/05/2014   stent placement    CARDIAC CATHETERIZATION  10/28/2017   CLOSED REDUCTION SHOULDER DISLOCATION     COLONOSCOPY WITH PROPOFOL N/A 12/22/2018   Procedure: COLONOSCOPY WITH PROPOFOL;  Surgeon: Jonathon Bellows, MD;  Location[facility] ENDOSCOPY;  Service: Gastroenterology;  Laterality: N/A;   CORONARY ANGIOPLASTY  04/05/2014   stent placement OM 1   CORONARY ARTERY BYPASS GRAFT     CORONARY STENT PLACEMENT  7/12   2 stents--Promus element plus (everolimus eluting)--[facility] in [place] WIRE/FFR STUDY N/A 05/13/2021   Procedure: INTRAVASCULAR PRESSURE WIRE/FFR STUDY;  Surgeon: Early Osmond, MD;  Location: [place] CV LAB;   Service: Cardiovascular;  Laterality: N/A;   LEFT HEART CATH AND CORONARY ANGIOGRAPHY N/A 05/13/2021   Procedure: LEFT HEART CATH AND CORONARY ANGIOGRAPHY;  Surgeon: Early Osmond, MD;  Location: [place] CV LAB;  Service: Cardiovascular;  Laterality: N/A;   LEFT HEART CATH AND CORS/GRAFTS ANGIOGRAPHY N/A 10/28/2017   Procedure: LEFT HEART CATH AND CORS/GRAFTS ANGIOGRAPHY;  Surgeon: Leonie Man, MD;  Location: [place] CV LAB;  Service: Cardiovascular;  Laterality: N/A;   LEFT HEART CATHETERIZATION WITH CORONARY/GRAFT ANGIOGRAM N/A 01/04/2012   Procedure: LEFT HEART CATHETERIZATION WITH Beatrix Fetters;  Surgeon: Burnell Blanks, MD;  Location: [facility] CATH LAB;  Service: Cardiovascular;  Laterality: N/A;   LEFT HEART CATHETERIZATION WITH CORONARY/GRAFT ANGIOGRAM N/A 04/05/2014   Procedure: LEFT HEART CATHETERIZATION WITH Beatrix Fetters;  Surgeon: Jettie Booze, MD;  Location: [facility] C[facility] LAB;  Service: Cardiovascular;  Laterality: N/A;   PERCUTANEOUS CORONARY STENT INTERVENTION (PCI-S)  04/05/2014   Procedure: PERCUTANEOUS CORONARY STENT INTERVENTION (PCI-S);  Surgeon: Jettie Booze, MD;  Location: [facility] CATH LAB;  Service: Cardiovascular;;  OM1   RENAL ANGIOGRAM N/A 04/16/2011   Procedure: RENAL ANGIOGRAM;  Surgeon: Burnell Blanks, MD;  Location[facility] C[facility] LAB;  Service: Cardiovascular;  Laterality: N/A;   RENAL ARTERY STENT  03/2011 ?   RIGHT HEART CATH N/A 04/22/2022   Procedure: RIGHT HEART CATH;  Surgeon: Lorretta Harp, MD;  Location: [place] CV LAB;  Service: Cardiovascular;  Laterality: N/A;     Allergies:   Metoprolol tartrate, Quinolones, and Metoclopramide   Social History   Tobacco Use   Smoking status: Former    Packs/day: 3.00    Years: 25.00    Total pack years: 75.00    Types: Cigarettes    Quit date: 05/28/1993    Years since quitting: 29.1    Passive exposure: Past   Smokeless tobacco: Never  Vaping Use   Vaping Use:  Never used  Substance Use Topics   Alcohol use: Yes    Alcohol/week: 42.0 standard drinks of alcohol    Types: 42 Cans of beer per week   Drug use: No     Current Outpatient Medications on File Prior to Visit  Medication Sig Dispense Refill   aspirin 81 MG tablet Take 81 mg by mouth daily.     carvedilol (COREG) 3.125 MG tablet TAKE 1 TABLET BY MOUTH 2 TIMES DAILY. 180 tablet 3   clopidogrel (PLAVIX) 75 MG tablet Take 1 tablet (75 mg total) by mouth daily. 90 tablet 3   cyanocobalamin (VITAMIN B12) 1000 MCG tablet Take 1,000 mcg by mouth daily.     ezetimibe (ZETIA) 10 MG tablet Take 1 tablet (10 mg total) by mouth daily. 90 tablet 3   fludrocortisone (FLORINEF) 0.1 MG tablet Take 1 tablet (0.1 mg total) by mouth daily. 90 tablet 1   furosemide (LASIX) 40 MG tablet Take 1 tablet (40 mg total) by mouth daily. Hold if weight under 163# 1 tablet 0   gabapentin (NEURONTIN) 100 MG capsule Take 1 capsule (100 mg total) by mouth 2 (two) times daily. 180 capsule 3   hydrocortisone (CORTEF) 10 MG tablet Take 1 tablet daily at 8 AM, 1 tablet daily at noon 200 tablet 1   mirtazapine (REMERON) 15 MG tablet TAKE 1 TABLET BY MOUTH AT BEDTIME 90 tablet 3   nitroGLYCERIN (NITROSTAT) 0.4 MG SL tablet PLACE 1 TABLET UNDER THE TONGUE EVERY 5 MINUTES AS NEEDED FOR CHEST PAIN. (Patient taking differently: Place 0.4 mg under the tongue every 5 (five) minutes as needed for chest pain (call 911).) 25 tablet 2   pantoprazole (PROTONIX) 40 MG tablet TAKE 1 TABLET BY MOUTH TWICE DAILY 180 tablet 3   ranolazine (RANEXA) 500 MG 12 hr tablet Take 1 tablet (500 mg total) by mouth 2 (two) times daily. 180 tablet 3   rosuvastatin (CRESTOR) 40 MG tablet Take 1 tablet (40 mg total) by mouth daily. 90 tablet 3   spironolactone (ALDACTONE) 25 MG tablet Take 0.5 tablets (12.5 mg total) by mouth daily. 45 tablet 3   No current facility-administered medications on file prior to visit.     Family Hx: The patient's family  history includes Coronary artery disease in his brother, father, mother, and sister; Heart attack in his father, maternal grandfather, and mother; Hypertension in his mother. There is no history of Diabetes or Cancer.  ROS:   Please see the history of  present illness.    Review of Systems  Constitutional: Negative.   HENT: Negative.    Respiratory: Negative.    Cardiovascular: Negative.   Gastrointestinal: Negative.   Musculoskeletal: Negative.   Neurological: Negative.   Psychiatric/Behavioral: Negative.    All other systems reviewed and are negative.    Labs/Other Tests and Data Reviewed:    Recent Labs: 01/24/2022: ALT 12 01/25/2022: TSH 1.342 04/20/2022: B Natriuretic Peptide 789.5 04/21/2022: Magnesium 2.7 04/22/2022: BUN 13; Creatinine, Ser 0.90; Hemoglobin 11.6; Hemoglobin 11.6; Platelets 362; Potassium 4.0; Potassium 4.1; Sodium 134; Sodium 133   Recent Lipid Panel Lab Results  Component Value Date/Time   CHOL 136 12/15/2021 08:19 AM   CHOL 163 08/11/2015 09:41 AM   TRIG 172.0 (H) 12/15/2021 08:19 AM   HDL 52.20 12/15/2021 08:19 AM   HDL 46 08/11/2015 09:41 AM   CHOLHDL 3 12/15/2021 08:19 AM   LDLCALC 50 12/15/2021 08:19 AM   LDLCALC 63 01/07/2017 11:20 AM   LDLDIRECT 65.0 02/20/2021 10:27 AM    Wt Readings from Last 3 Encounters:  07/05/22 177 lb 4 oz (80.4 kg)  06/22/22 176 lb (79.8 kg)  06/21/22 176 lb (79.8 kg)     Exam:    Vital Signs: Vital signs may also be detailed in the HPI BP 94/70 (BP Location: Left Arm, Patient Position: Sitting, Cuff Size: Normal)   Pulse 64   Ht 5\' 3"  (1.6 m)   Wt 177 lb 4 oz (80.4 kg)   SpO2 98%   BMI 31.40 kg/m   Constitutional:  oriented to person, place, and time. No distress.  HENT:  Head: Grossly normal Eyes:  no discharge. No scleral icterus.  Neck: No JVD, no carotid bruits  Cardiovascular: Regular rate and rhythm, no murmurs appreciated Pulmonary/Chest: Clear to auscultation bilaterally, no wheezes or  rails Abdominal: Soft.  no distension.  no tenderness.  Musculoskeletal: Normal range of motion Neurological:  normal muscle tone. Coordination normal. No atrophy Skin: Skin warm and dry Psychiatric: normal affect, pleasant  ASSESSMENT & PLAN:    Problem List Items Addressed This Visit       Cardiology Problems   AAA (abdominal aortic aneurysm) without rupture (HCC)   Coronary artery disease involving native coronary artery of native heart with angina pectoris ([place]) - Primary   Essential hypertension   Aortic atherosclerosis (HCC)   Renal artery stenosis (HCC)   Other Visit Diagnoses     HFrEF (heart failure with reduced ejection fraction) (HCC)       Ischemic cardiomyopathy       PAD (peripheral artery disease) (HCC)         CAD Prior non-STEMI, cardiac catheterization reviewed by interventional team, EF 30 to 35% No plan for ischemic workup at this time Denies anginal symptoms  Chronic systolic CHF Recent hospitalization December 2023, EF 30 to 35% Recommend he start Jardiance/Farxiga, at that point decrease Lasix down to 20 mg daily with BMP in several weeks time  PAD: AAA measuring 4.77 on CT scan, scheduled for endograft placement with Dr. Lucky Cowboy Acceptable risk for procedure, no further cardiac testing needed  Renal artery stenosis: prior stent on left 40 to 59% disease/stenosis bilaterally Followed by vascular Cholesterol at goal  ETOH Cessation recommended  Essential hypertension Blood pressure low, adrenal insufficiency on Florinef  Hyperlipidemia Cholesterol is at goal on the current lipid regimen. No changes to the medications were made.  SOB with exertion CHF and COPD Long smoking hx, 3 ppd, quit 1995 Currently on Lasix  40 daily, plan to decrease down to 20 mg daily once he starts SGLT2 inhibitor  Back pain Chronic issue, stable at this time  Messages sent to pharmacy and endocrine concerning medication changes above  Total encounter time more  than 40 minutes  Greater than 50% was spent in counseling and coordination of care with the patient   Signed, Ida Rogue, [facility] [facility] [address], [place], [place] [address]

## 2022-07-05 ENCOUNTER — Ambulatory Visit: Payer: Medicare Other | Attending: Cardiovascular Disease | Admitting: Cardiovascular Disease

## 2022-07-05 ENCOUNTER — Encounter: Payer: Self-pay | Admitting: Cardiovascular Disease

## 2022-07-05 VITALS — BP 94/70 | HR 64 | Ht 63.0 in | Wt 177.2 lb

## 2022-07-05 DIAGNOSIS — I739 Peripheral vascular disease, unspecified: Secondary | ICD-10-CM | POA: Diagnosis not present

## 2022-07-05 DIAGNOSIS — I25119 Atherosclerotic heart disease of native coronary artery with unspecified angina pectoris: Secondary | ICD-10-CM | POA: Diagnosis not present

## 2022-07-05 DIAGNOSIS — I700 Atherosclerosis of aorta: Secondary | ICD-10-CM | POA: Diagnosis not present

## 2022-07-05 DIAGNOSIS — I701 Atherosclerosis of renal artery: Secondary | ICD-10-CM | POA: Diagnosis not present

## 2022-07-05 DIAGNOSIS — I255 Ischemic cardiomyopathy: Secondary | ICD-10-CM | POA: Diagnosis not present

## 2022-07-05 DIAGNOSIS — I714 Abdominal aortic aneurysm, without rupture, unspecified: Secondary | ICD-10-CM | POA: Insufficient documentation

## 2022-07-05 DIAGNOSIS — I10 Essential (primary) hypertension: Secondary | ICD-10-CM | POA: Diagnosis not present

## 2022-07-05 DIAGNOSIS — I5020 Unspecified systolic (congestive) heart failure: Secondary | ICD-10-CM | POA: Diagnosis not present

## 2022-07-05 MED ORDER — CLOPIDOGREL BISULFATE 75 MG PO TABS: 75.0000 mg | ORAL_TABLET | Freq: Every day | ORAL | 3 refills | Status: DC

## 2022-07-05 MED ORDER — RANOLAZINE ER 500 MG PO TB12: 500.0000 mg | ORAL_TABLET | Freq: Two times a day (BID) | ORAL | 3 refills | Status: DC

## 2022-07-05 MED ORDER — EZETIMIBE 10 MG PO TABS: 10.0000 mg | ORAL_TABLET | Freq: Every day | ORAL | 3 refills | Status: DC

## 2022-07-05 MED ORDER — ROSUVASTATIN CALCIUM 40 MG PO TABS: 40.0000 mg | ORAL_TABLET | Freq: Every day | ORAL | 3 refills | Status: DC

## 2022-07-05 NOTE — Patient Instructions (Addendum)
Medication Instructions:  Hold the spironolactone When you start farxiga/jardiance, cut the lasix down to 20 mg daily  If you need a refill on your cardiac medications before your next appointment, please call your pharmacy.   Lab work: BMP 2-3 weeks after starting farxiga  Testing/Procedures: No new testing needed  Follow-Up: At [facility], you and your health needs are our priority.  As part of our continuing mission to provide you with exceptional heart care, we have created designated Provider Care Teams.  These Care Teams include your primary Cardiologist (physician) and Advanced Practice Providers (APPs -  Physician Assistants and Nurse Practitioners) who all work together to provide you with the care you need, when you need it.  You will need a follow up appointment in 3 months  Providers on your designated Care Team:   Murray Hodgkins, NP Christell Faith, PA-C Cadence Kathlen Mody, [place]  COVID-19 Vaccine Information can be found at: [email or web address].uk For questions related to vaccine distribution or appointments, please email [email or web address] or call [phone].

## 2022-07-06 ENCOUNTER — Telehealth (INDEPENDENT_AMBULATORY_CARE_PROVIDER_SITE_OTHER): Payer: Self-pay

## 2022-07-06 ENCOUNTER — Ambulatory Visit: Payer: Medicare Other | Admitting: Pharmacist

## 2022-07-06 NOTE — Telephone Encounter (Signed)
Spoke with the patient and he is scheduled with Dr. Lucky Cowboy on 07/14/22 with a 6:45 am arrival time to the [facility] for a AAA stent graft repair. Pre- op phone call is on 07/08/22 between 8-1 pm. Pre-surgical instructions were discussed and will be sent to Mychart per patient request.

## 2022-07-06 NOTE — Progress Notes (Signed)
Care Management & Coordination Services Pharmacy Note  07/06/2022 Name:  BAER SNAY MRN:  [id number] DOB:  01-Apr-1951  Summary: F/U visit -HFrEF: complicated by adrenal insufficiency; pt reports wt has been stable around 170-172 lbs lately, SOB and energy both improved; yesterday pt saw cardiology and spironolactone was discontinued d/t interaction with adrenal insufficiency, and recommended to restart [place]; Pt could not afford [place] previously but will qualify for [place] now -DM: A1c 6.8% (04/2021) in diabetic range but not on treatment; he is due for re-check  Recommendations/Changes made from today's visit: -Enrolled in [place] for [place]; contacted pharmacy to refill Rx on file; updated med list with Farxiga 10 mg -Advised to continue weighing daily; contact provider if wt gain 3+ lbs in 1 day or 5+ lbs in 1 week -Recommend to check A1c at next OV  Follow up plan: -Pharmacist follow up televisit scheduled for 1 month -Endocrine appt 07/15/22; PCP appt 08/25/22; Cardiology appt 10/25/22    Subjective: [name] is an 72 y.o. year old male who is a primary patient of Venia Carbon, MD.  The care coordination team was consulted for assistance with disease management and care coordination needs.    Engaged with patient face to face for initial visit. Patient lives at home with his wife Manuela Schwartz. They have 7 acres of land a large garden, they harvest vegetables from and freeze them through the winter. They are from [place] and travel there sometimes to visit children and grandchildren. They moved to [place] over 20 years ago, and have 1 son living locally.   Recent office visits: 06/21/22 Dr Silvio Pate OV: SOB - worsening CHF. Take extra furosemide now, then daily unless wt is under 163#  05/26/22 Dr Silvio Pate OV: COPD exacerbation - rx doxycycline, prednisone  04/28/22 Dr Silvio Pate OV: hospital f/u - fluid overload following fludrocortisone  start.  02/24/22 Dr Silvio Pate OV: annual - off ARB d/t hypotension; off Farxiga d/t cost.  Recent consult visits: 07/05/22 Dr Rockey Situ (Cardiology): f/u CAD, HF. D/C spironolactone, recommend Jardiance/Farxiga. Would decrease lasix down to 20 mg when starting SGLT2, repeat BMP in several weeks.  06/22/22 Dr Lucky Cowboy (Vasc Surg): AAA - US shows growth - increased risk of rupture. Order CT and plan for repair in near future. ABI demonstrate PAD w/ claudication.   05/10/22 Dr Rockey Situ (Cardiology): f/u - recent hospital required IV lasix 2/2 dietary indiscretion over holidays; hold lasix for SBP < 100  04/09/22 Dr Dorris Fetch (Endocrine): adrenal insufficiency - rx hydrocortisone and fludrocortisone.   03/08/22 PA Cadence Kathlen Mody (Cardiology): HF; previously unable to afford [place], not interested in PAP. Start spironolactone 12.5 mg daily  Hospital visits: 04/20/22 - 04/22/22 Admission [facility]): CHF exacerbation. Discharged on lasix PRN.   01/24/22 - 01/26/22 Admission [facility]): Hypotension, dizziness (SBP 50s). Suspicious for adrenal insufficiency. Sx improved with Decadron. Needs endocrine f/u.  Objective:  Lab Results  Component Value Date   CREATININE 0.90 04/22/2022   BUN 13 04/22/2022   GFR 87.27 12/15/2021   EGFR 96 06/04/2021   GFRNONAA >60 04/22/2022   GFRAA >60 01/18/2020   NA 134 (L) 04/22/2022   NA 133 (L) 04/22/2022   K 4.0 04/22/2022   K 4.1 04/22/2022   CALCIUM 8.9 04/22/2022   CO2 23 04/22/2022   GLUCOSE 135 (H) 04/22/2022    Lab Results  Component Value Date/Time   HGBA1C 6.8 (H) 05/14/2021 12:36 AM   HGBA1C 5.8 (H) 04/04/2014 03:39 PM   GFR 87.27 12/15/2021 08:19  AM   GFR 89.38 02/20/2021 10:27 AM    Last diabetic Eye exam: No results found for: "HMDIABEYEEXA"  Last diabetic Foot exam: No results found for: "HMDIABFOOTEX"   Lab Results  Component Value Date   CHOL 136 12/15/2021   HDL 52.20 12/15/2021   LDLCALC 50 12/15/2021   LDLDIRECT 65.0 02/20/2021   TRIG 172.0 (H)  12/15/2021   CHOLHDL 3 12/15/2021       Latest Ref Rng & Units 01/24/2022   10:10 PM 12/15/2021    8:19 AM 05/13/2021    1:02 AM  Hepatic Function  Total Protein 6.5 - 8.1 g/dL 6.4  6.5    6.7  5.9   Albumin 3.5 - 5.0 g/dL 3.5  4.0  3.1   AST 15 - 41 U/L 18  15  38   ALT 0 - 44 U/L '12  8  23   '$ Alk Phosphatase 38 - 126 U/L 36  37  33   Total Bilirubin 0.3 - 1.2 mg/dL 0.7  0.6  1.4     Lab Results  Component Value Date/Time   TSH 1.342 01/25/2022 05:50 PM   TSH 2.73 12/15/2021 08:19 AM   TSH 2.086 10/28/2017 04:16 PM   TSH 4.010 04/04/2014 03:39 PM   FREET4 0.83 01/05/2016 12:02 PM   FREET4 0.90 12/31/2014 10:25 AM       Latest Ref Rng & Units 04/22/2022    8:03 AM 04/22/2022    7:30 AM 04/20/2022    9:59 AM  CBC  WBC 4.0 - 10.5 K/uL  6.9  8.0   Hemoglobin 13.0 - 17.0 g/dL 13.0 - 17.0 g/dL 11.6    11.6  10.9  10.6   Hematocrit 39.0 - 52.0 % 39.0 - 52.0 % 34.0    34.0  34.3  34.1   Platelets 150 - 400 K/uL  362  336     Lab Results  Component Value Date/Time   VITAMINB12 750 01/25/2022 05:50 PM   VITAMINB12 219 12/15/2021 08:19 AM    Clinical ASCVD: Yes  The ASCVD Risk score (Arnett DK, et al., 2019) failed to calculate for the following reasons:   The patient has a prior MI or stroke diagnosis       02/24/2022   10:53 AM 08/19/2021    9:54 AM 06/09/2021    9:46 AM  Depression screen PHQ 2/9  Decreased Interest 0 0 0  Down, Depressed, Hopeless 0 0 0  PHQ - 2 Score 0 0 0  Altered sleeping  0 0  Tired, decreased energy  1 1  Change in appetite  0 0  Feeling bad or failure about yourself   0 0  Trouble concentrating  0 0  Moving slowly or fidgety/restless  0 0  Suicidal thoughts  0 0  PHQ-9 Score  1 1  Difficult doing work/chores  Not difficult at all Not difficult at all     Social History   Tobacco Use  Smoking Status Former   Packs/day: 3.00   Years: 25.00   Total pack years: 75.00   Types: Cigarettes   Quit date: 05/28/1993   Years since  quitting: 29.1   Passive exposure: Past  Smokeless Tobacco Never   BP Readings from Last 3 Encounters:  07/05/22 94/70  06/22/22 131/85  06/21/22 138/82   Pulse Readings from Last 3 Encounters:  07/05/22 64  06/22/22 60  06/21/22 78   Wt Readings from Last 3 Encounters:  07/05/22 177 lb 4  oz (80.4 kg)  06/22/22 176 lb (79.8 kg)  06/21/22 176 lb (79.8 kg)   BMI Readings from Last 3 Encounters:  07/05/22 31.40 kg/m  06/22/22 31.18 kg/m  06/21/22 31.18 kg/m    Allergies  Allergen Reactions   Metoprolol Tartrate Hives and Itching   Quinolones Other (See Comments)    Contraindicated with AAA   Metoclopramide Nausea Only    Medications Reviewed Today     Reviewed by Charlton Haws, [facility] (Pharmacist) on 07/06/22 at 1214  Med List Status: <None>   Medication Order Taking? Sig Documenting Provider Last Dose Status Informant  aspirin 81 MG tablet [id number] Yes Take 81 mg by mouth daily. [provider] Taking Active Self  carvedilol (COREG) 3.125 MG tablet [id number] Yes TAKE 1 TABLET BY MOUTH 2 TIMES DAILY. Furth, Cadence H, PA-C Taking Active   clopidogrel (PLAVIX) 75 MG tablet [id number] Yes Take 1 tablet (75 mg total) by mouth daily. Minna Merritts, MD Taking Active   cyanocobalamin (VITAMIN B12) 1000 MCG tablet [id number] Yes Take 1,000 mcg by mouth daily. [provider] Taking Active Self  dapagliflozin propanediol (FARXIGA) 10 MG TABS tablet [id number]  Take 10 mg by mouth daily. Kathlen Mody, Cadence H, PA-C  Active            Med Note Charlton Haws   Tue Jul 06, 2022 12:13 PM) [employer] grant 06/06/22 - 06/06/23  ezetimibe (ZETIA) 10 MG tablet [id number] Yes Take 1 tablet (10 mg total) by mouth daily. Minna Merritts, MD Taking Active   fludrocortisone (FLORINEF) 0.1 MG tablet [id number] Yes Take 1 tablet (0.1 mg total) by mouth daily. Cassandria Anger, MD Taking Active Self  furosemide (LASIX) 40 MG tablet [id number] Yes Take 1 tablet (40  mg total) by mouth daily. Hold if weight under 163# Venia Carbon, MD Taking Active   gabapentin (NEURONTIN) 100 MG capsule [id number] Yes Take 1 capsule (100 mg total) by mouth 2 (two) times daily. Venia Carbon, MD Taking Active   hydrocortisone (CORTEF) 10 MG tablet [id number] Yes Take 1 tablet daily at 8 AM, 1 tablet daily at noon Cassandria Anger, MD Taking Active Self  mirtazapine (REMERON) 15 MG tablet [id number] Yes TAKE 1 TABLET BY MOUTH AT BEDTIME Venia Carbon, MD Taking Active   nitroGLYCERIN (NITROSTAT) 0.4 MG SL tablet [id number] Yes PLACE 1 TABLET UNDER THE TONGUE EVERY 5 MINUTES AS NEEDED FOR CHEST PAIN.  Patient taking differently: Place 0.4 mg under the tongue every 5 (five) minutes as needed for chest pain (call 911).   Venia Carbon, MD Taking Active Self  pantoprazole (PROTONIX) 40 MG tablet [id number] Yes TAKE 1 TABLET BY MOUTH TWICE DAILY Venia Carbon, MD Taking Active   ranolazine (RANEXA) 500 MG 12 hr tablet [id number] Yes Take 1 tablet (500 mg total) by mouth 2 (two) times daily. Minna Merritts, MD Taking Active   rosuvastatin (CRESTOR) 40 MG tablet [id number] Yes Take 1 tablet (40 mg total) by mouth daily. Minna Merritts, MD Taking Active             SDOH:  (Social Determinants of Health) assessments and interventions performed: No SDOH Interventions    Flowsheet Row Telephone from 04/23/2022 in [place] from 02/04/2022 in [place] Interventions    Food Insecurity Interventions Intervention Not Indicated Intervention Not Indicated  Housing Interventions Intervention Not Indicated Intervention Not Indicated  Transportation  Interventions Intervention Not Indicated Intervention Not Indicated       Medication Assistance:  [place] approved 06/06/22 - 06/06/23 BIN: [id number] PCN: [id number] GRP: [id number] ID:  [id number]  Medication Access: Within the past 30 days, how often has patient missed a dose of medication? 0 Is a pillbox or other method used to improve adherence? Yes  Factors that may affect medication adherence? financial need Are meds synced by current pharmacy? No  Are meds delivered by current pharmacy? No  Does patient experience delays in picking up medications due to transportation concerns? No   Upstream Services Reviewed: Is patient disadvantaged to use [facility]?: No  Current Rx insurance plan: [employer] [place] Name and location of Current pharmacy:  [address] [place] [address] Phone: [phone] Fax: [phone]  [facility] Pharmacy services reviewed with patient today?: No  Patient requests to transfer care to [facility] Pharmacy?: No  Reason patient declined to change pharmacies: Not mentioned at this visit  Compliance/Adherence/Medication fill history: Care Gaps: Colonoscopy (due 11/2021)  Star-Rating Drugs: Rosuvastatin - PDC 99%   ASSESSMENT / PLAN  Heart Failure / HTN (Goal: BP < 130/80) -Controlled  - pt reports checking BP and weighing twice daily at home; pt reports breathing is improved and he has more energy lately -Follows with cardiology (Dr Rockey Situ) -Home BP/HR readings: 100s/60s - 140s/80s -Home daily weights: 172# today; range 170-172 over the past week -Last ejection fraction: 30-35% (Date: 04/21/22) -HF type: HFrEF (EF < 40%) -NYHA Class: II (slight limitation of activity) -AHA HF Stage: C (Heart disease and symptoms present) -Current treatment: Carvedilol 3.125 mg BID - Appropriate, Effective, Safe, Accessible Furosemide 40 mg daily - Appropriate, Effective, Safe, Accessible -Medications previously tried: losartan (low BP), spironolactone (PAI) -Current dietary habits: chicken, fish, vegetables from garden -Current exercise  habits: limited currently -Educated on Benefits of medications for managing symptoms and prolonging life Importance of weighing daily - for weight gain more than 2+ pounds in one day or 5 pounds in one week, contact provider -Enrolled patient in [place] for [place]; contacted pharmacy to refill previous Rx for Farxiga 10 mg daily  Hyperlipidemia / CAD: (LDL goal < 55) -Controlled - LDL 50 (11/2021) at goal -Hx CAD - CABG 1995; stent 2004; stent 2012; NSTEMI 04/2021 and 03/2022 -Current treatment: Ezetimibe 10 mg daily - Appropriate, Effective, Safe, Accessible Rosuvastatin 40 mg daily -Appropriate, Effective, Safe, Accessible Clopidogrel 75 mg daily -Appropriate, Effective, Safe, Accessible Aspirin 81 mg daily -Appropriate, Effective, Safe, Accessible Ranexa 500 mg BID -Appropriate, Effective, Safe, Accessible Nitroglycerin 0.4 mg SL prn - not used -Medications previously tried: n/a  -Educated on Cholesterol goals; Benefits of statin for ASCVD risk reduction; -Recommended to continue current medication  COPD (Goal: control symptoms and prevent exacerbations) -Controlled -Gold Grade: Gold 1 (FEV1>80%) -Current COPD Classification:  A (low sx, 0-1 moderate exacerbations, no hospitalizations) -MMRC/CAT score: not on file -Pulmonary function testing: 10/2017 - FEV1 84% predicted; FEV1/FVC 0.7 -Exacerbations requiring treatment in last 6 months: 1 -Current treatment  None -Medications previously tried: n/a   Adrenal insufficiency -Query controlled - pt reports still feels weak/fatigued, ever since discharge from hospital after HF exacerbation -Follows with endocrine (Dr Dorris Fetch). Dx 01/2022 -Current treatment  Fludrocortisone 0.1 mg daily - Appropriate, Effective, Query Safe Hydrocortisone 10 mg 8am and noon - Appropriate, Query Effective -Medications previously tried: dexamethasone  -See  HF discussion above - possible interaction with spironolactone and  fludrocortisone/adrenal insufficiency in general, consulting with cardiology and endocrine regarding best plan -Recommended to continue current medication  Query Diabetes (A1c goal <7%) -A1c 6.8% (04/2021). No diagnosis. -Pt would be more prone to hypoglycemia given adrenal insufficiency -Recommend to start Farxiga (see Heart Failure discussion)  Query sleep apnea -Patient saw pulmonary 10/2017 and recommended sleep study, which was never done  Health Maintenance -Vaccine gaps: Shingrix, RSV -On pantoprazole BID ? -on mirtazapine 15 mg HS -on gabapentin 100 mg BID -OTC: Vit B12   Charlene Brooke, PharmD, BCACP Clinical Pharmacist [facility] at [facility] [phone]

## 2022-07-06 NOTE — Patient Instructions (Signed)
Visit Information  Phone number for Pharmacist: [phone]  Thank you for meeting with me to discuss your medications! Below is a summary of what we talked about during the visit:    Summary: F/U visit -HFrEF: complicated by adrenal insufficiency; pt reports wt has been stable around 170-172 lbs lately, SOB and energy both improved; yesterday pt saw cardiology and spironolactone was discontinued d/t interaction with adrenal insufficiency, and recommended to restart [place]; Pt could not afford [place] previously but will qualify for [place] now -DM: A1c 6.8% (04/2021) in diabetic range but not on treatment; he is due for re-check  Recommendations/Changes made from today's visit: -Enrolled in [place] for [place]; contacted pharmacy to refill Rx on file -Advised to continue weighing daily; contact provider if wt gain 3+ lbs in 1 day or 5+ lbs in 1 week -Recommend to check A1c at next OV  Follow up plan: -Pharmacist follow up televisit scheduled for 1 month -Endocrine appt 07/15/22; PCP appt 08/25/22; Cardiology appt 10/25/22   Charlene Brooke, PharmD, BCACP Clinical Pharmacist [facility] at [facility] [phone]

## 2022-07-07 ENCOUNTER — Telehealth: Payer: Self-pay | Admitting: "Endocrinology

## 2022-07-07 NOTE — Telephone Encounter (Signed)
Pt's wife stated pt is going to have his lab work drawn but will not be able to keep his appointment next week due to having a procedure that will require him to stay in the hospital short term. States she is not sure how long he will be in the hospital but once he is back home they will call to reschedule.

## 2022-07-07 NOTE — Telephone Encounter (Signed)
Pt left voicemail stating she needed a call back from Dr. Liliane Channel Nurse.

## 2022-07-08 ENCOUNTER — Other Ambulatory Visit (INDEPENDENT_AMBULATORY_CARE_PROVIDER_SITE_OTHER): Payer: Self-pay | Admitting: Nurse Practitioner

## 2022-07-08 ENCOUNTER — Encounter
Admission: RE | Admit: 2022-07-08 | Discharge: 2022-07-08 | Disposition: A | Discharge status: Home / Self Care | Payer: Medicare Other | Source: Ambulatory Visit | Attending: Vascular Surgery | Admitting: Vascular Surgery

## 2022-07-08 ENCOUNTER — Telehealth: Payer: Self-pay | Admitting: *Deleted

## 2022-07-08 ENCOUNTER — Other Ambulatory Visit: Payer: Self-pay

## 2022-07-08 DIAGNOSIS — R0602 Shortness of breath: Secondary | ICD-10-CM

## 2022-07-08 DIAGNOSIS — I70219 Atherosclerosis of native arteries of extremities with intermittent claudication, unspecified extremity: Secondary | ICD-10-CM

## 2022-07-08 DIAGNOSIS — Z01812 Encounter for preprocedural laboratory examination: Secondary | ICD-10-CM

## 2022-07-08 DIAGNOSIS — I714 Abdominal aortic aneurysm, without rupture, unspecified: Secondary | ICD-10-CM

## 2022-07-08 DIAGNOSIS — Z7901 Long term (current) use of anticoagulants: Secondary | ICD-10-CM | POA: Insufficient documentation

## 2022-07-08 HISTORY — DX: Chronic obstructive pulmonary disease, unspecified: J44.9

## 2022-07-08 HISTORY — DX: Cardiac arrhythmia, unspecified: I49.9

## 2022-07-08 HISTORY — DX: Heart failure, unspecified: I50.9

## 2022-07-08 LAB — COMPREHENSIVE METABOLIC PANEL
ALT: 15 U/L (ref 0–44)
AST: 26 U/L (ref 15–41)
Albumin: 3.9 g/dL (ref 3.5–5.0)
Alkaline Phosphatase: 31 U/L — ABNORMAL LOW (ref 38–126)
Anion gap: 12 (ref 5–15)
BUN: 12 mg/dL (ref 8–23)
CO2: 25 mmol/L (ref 22–32)
Calcium: 8.9 mg/dL (ref 8.9–10.3)
Chloride: 98 mmol/L (ref 98–111)
Creatinine, Ser: 1.09 mg/dL (ref 0.61–1.24)
GFR, Estimated: >60 mL/min (ref >60)
Glucose, Bld: 130 mg/dL — ABNORMAL HIGH (ref 70–99)
Potassium: 3.3 mmol/L — ABNORMAL LOW (ref 3.5–5.1)
Sodium: 135 mmol/L (ref 135–145)
Total Bilirubin: 1 mg/dL (ref 0.3–1.2)
Total Protein: 7.4 g/dL (ref 6.5–8.1)

## 2022-07-08 LAB — CBC
HCT: 36.1 % — ABNORMAL LOW (ref 39.0–52.0)
Hemoglobin: 11.5 g/dL — ABNORMAL LOW (ref 13.0–17.0)
MCH: 24.7 pg — ABNORMAL LOW (ref 26.0–34.0)
MCHC: 31.9 g/dL (ref 30.0–36.0)
MCV: 77.6 fL — ABNORMAL LOW (ref 80.0–100.0)
Platelets: 349 10*3/uL (ref 150–400)
RBC: 4.65 MIL/uL (ref 4.22–5.81)
RDW: 20.8 % — ABNORMAL HIGH (ref 11.5–15.5)
WBC: 5.6 10*3/uL (ref 4.0–10.5)
nRBC: 0 % (ref 0.0–0.2)

## 2022-07-08 LAB — PROTIME-INR
INR: 1.1 (ref 0.8–1.2)
Prothrombin Time: 13.7 seconds (ref 11.4–15.2)

## 2022-07-08 LAB — APTT: aPTT: 29 seconds (ref 24–36)

## 2022-07-08 LAB — TYPE AND SCREEN
ABO/RH(D): O POS
Antibody Screen: NEGATIVE

## 2022-07-08 NOTE — Patient Instructions (Addendum)
Your procedure is scheduled on: 07/14/22 - Wednesday Report to the Registration Desk on the 1st floor of the [facility]. To find out your arrival time, please call [phone] between 1PM - 3PM on: 07/13/22 - Tuesday If your arrival time is 6:00 am, do not arrive before that time as the [facility] entrance doors do not open until 6:00 am.  REMEMBER: Instructions that are not followed completely may result in serious medical risk, up to and including death; or upon the discretion of your surgeon and anesthesiologist your surgery may need to be rescheduled.  Do not eat food or drink any liquids after midnight the night before surgery.  No gum chewing or hard candies.  One week prior to surgery: Stop Anti-inflammatories (NSAIDS) such as Advil, Aleve, Ibuprofen, Motrin, Naproxen, Naprosyn and Aspirin based products such as Excedrin, Goody's Powder, BC Powder.  Stop ANY OVER THE COUNTER supplements until after surgery. Multiple Vitamin (MULTIVITAMIN WITH MINERALS)   You may however, continue to take Tylenol if needed for pain up until the day of surgery.  Continue taking all prescribed medications with the exception of the following:  HOLD dapagliflozin propanediol (FARXIGA) beginning 07/11/22.   TAKE ONLY THESE MEDICATIONS THE MORNING OF SURGERY WITH A SIP OF WATER:  pantoprazole (PROTONIX) - (take one the night before and one on the morning of surgery - helps to prevent nausea after surgery.) carvedilol (COREG)  ezetimibe (ZETIA)  fludrocortisone (FLORINEF)  gabapentin (NEURONTIN)  hydrocortisone (CORTEF)  ranolazine (RANEXA)  rosuvastatin (CRESTOR)    No Alcohol for 24 hours before or after surgery.  No Smoking including e-cigarettes for 24 hours before surgery.  No chewable tobacco products for at least 6 hours before surgery.  No nicotine patches on the day of surgery.  Do not use any "recreational" drugs for at least a week (preferably 2 weeks) before your surgery.   Please be advised that the combination of cocaine and anesthesia may have negative outcomes, up to and including death. If you test positive for cocaine, your surgery will be cancelled.  On the morning of surgery brush your teeth with toothpaste and water, you may rinse your mouth with mouthwash if you wish. Do not swallow any toothpaste or mouthwash.  Use CHG Soap or wipes as directed on instruction sheet.  Do not wear jewelry, make-up, hairpins, clips or nail polish.  Do not wear lotions, powders, or perfumes.   Do not shave body hair from the neck down 48 hours before surgery.  Contact lenses, hearing aids and dentures may not be worn into surgery.  Do not bring valuables to the hospital. [facility] is not responsible for any missing/lost belongings or valuables.   Notify your doctor if there is any change in your medical condition (cold, fever, infection).  Wear comfortable clothing (specific to your surgery type) to the hospital.  After surgery, you can help prevent lung complications by doing breathing exercises.  Take deep breaths and cough every 1-2 hours. Your doctor may order a device called an Incentive Spirometer to help you take deep breaths. When coughing or sneezing, hold a pillow firmly against your incision with both hands. This is called "splinting." Doing this helps protect your incision. It also decreases belly discomfort.  If you are being admitted to the hospital overnight, leave your suitcase in the car. After surgery it may be brought to your room.  In case of increased patient census, it may be necessary for you, the patient, to continue your postoperative  care in the Same Day Surgery department.  If you are being discharged the day of surgery, you will not be allowed to drive home. You will need a responsible individual to drive you home and stay with you for 24 hours after surgery.   If you are taking public transportation, you will need to have a  responsible individual with you.  Please call the [facility] Dept. at [phone] if you have any questions about these instructions.  Surgery Visitation Policy:  Patients undergoing a surgery or procedure may have two family members or support persons with them as long as the person is not COVID-19 positive or experiencing its symptoms.   Inpatient Visitation:    Visiting hours are 7 a.m. to 8 p.m. Up to four visitors are allowed at one time in a patient room. The visitors may rotate out with other people during the day. One designated support person (adult) may remain overnight.  Due to an increase in RSV and influenza rates and associated hospitalizations, children ages 57 and under will not be able to visit patients in [facility]. Masks continue to be strongly recommended.

## 2022-07-08 NOTE — Telephone Encounter (Signed)
-----   Message from Karen Kitchens, NP sent at 07/08/2022  1:10 PM EDT ----- Regarding: Request for pre-operative cardiac clearance Request for pre-operative cardiac clearance:  1. What type of surgery is being performed?  ENDOVASCULAR REPAIR/STENT GRAFT  2. When is this surgery scheduled?  07/14/2022  3. Type of clearance being requested (medical, pharmacy, both)? MEDICAL    4. Are there any medications that need to be held prior to surgery? NONE.... on DAPT, ut may continue per Dr. Lucky Cowboy (Vascular)  5. Practice name and name of physician performing surgery?  Performing surgeon: Dr. Leotis Pain, MD Requesting clearance: Honor Loh, FNP-C    6. Anesthesia type (none, local, MAC, general)? GENERAL  7. What is the office phone and fax number?   Phone: [phone] Fax: [phone]  ATTENTION: Unable to create telephone message as per your standard workflow. Directed by [facility] providers to send requests for cardiac clearance to this pool for appropriate distribution to provider covering pre-operative clearances.   Honor Loh, MSN, APRN, FNP-C, CEN [facility]  Peri-operative Services Nurse Practitioner Phone: [phone] 07/08/22 1:10 PM

## 2022-07-12 NOTE — Telephone Encounter (Signed)
   Name: [name]  DOB: 21-Nov-1950  MRN: [id number]   Primary Cardiologist: Ida Rogue, MD  Chart reviewed as part of pre-operative protocol coverage. [name] was last seen on 07/05/2022 by Dr. Rockey Situ.  Per Dr. Rockey Situ: "I made an addendum to my note March 11 to clear him for endovascular surgical procedure with Dr. Lucky Cowboy. Acceptable risk, no further cardiac testing needed."   Therefore, based on ACC/AHA guidelines, the patient would be at acceptable risk for the planned procedure without further cardiovascular testing.   I will route this recommendation to the requesting party via Epic fax function and remove from pre-op pool. Please call with questions.  Mayra Reel, NP 07/12/2022, 10:45 AM

## 2022-07-14 ENCOUNTER — Inpatient Hospital Stay: Payer: Medicare Other | Admitting: Urgent Care

## 2022-07-14 ENCOUNTER — Other Ambulatory Visit: Payer: Self-pay

## 2022-07-14 ENCOUNTER — Encounter: Payer: Self-pay | Admitting: Vascular Surgery

## 2022-07-14 ENCOUNTER — Inpatient Hospital Stay: Payer: Medicare Other | Admitting: Certified Registered"

## 2022-07-14 ENCOUNTER — Encounter
Admission: RE | Disposition: A | Discharge status: Home / Self Care | Payer: Self-pay | Source: Home / Self Care | Attending: Vascular Surgery

## 2022-07-14 ENCOUNTER — Inpatient Hospital Stay
Admission: RE | Admit: 2022-07-14 | Discharge: 2022-07-15 | DRG: 269 | Disposition: A | Discharge status: Home / Self Care | Payer: Medicare Other | Attending: Vascular Surgery | Admitting: Vascular Surgery

## 2022-07-14 DIAGNOSIS — K219 Gastro-esophageal reflux disease without esophagitis: Secondary | ICD-10-CM | POA: Diagnosis present

## 2022-07-14 DIAGNOSIS — Z7982 Long term (current) use of aspirin: Secondary | ICD-10-CM

## 2022-07-14 DIAGNOSIS — I110 Hypertensive heart disease with heart failure: Secondary | ICD-10-CM | POA: Diagnosis present

## 2022-07-14 DIAGNOSIS — E271 Primary adrenocortical insufficiency: Secondary | ICD-10-CM | POA: Diagnosis present

## 2022-07-14 DIAGNOSIS — Z8249 Family history of ischemic heart disease and other diseases of the circulatory system: Secondary | ICD-10-CM | POA: Diagnosis not present

## 2022-07-14 DIAGNOSIS — I714 Abdominal aortic aneurysm, without rupture, unspecified: Principal | ICD-10-CM | POA: Diagnosis present

## 2022-07-14 DIAGNOSIS — I2510 Atherosclerotic heart disease of native coronary artery without angina pectoris: Secondary | ICD-10-CM | POA: Diagnosis present

## 2022-07-14 DIAGNOSIS — Z888 Allergy status to other drugs, medicaments and biological substances status: Secondary | ICD-10-CM | POA: Diagnosis not present

## 2022-07-14 DIAGNOSIS — T82856A Stenosis of peripheral vascular stent, initial encounter: Secondary | ICD-10-CM | POA: Diagnosis present

## 2022-07-14 DIAGNOSIS — Z79899 Other long term (current) drug therapy: Secondary | ICD-10-CM | POA: Diagnosis not present

## 2022-07-14 DIAGNOSIS — Z6831 Body mass index (BMI) 31.0-31.9, adult: Secondary | ICD-10-CM | POA: Diagnosis not present

## 2022-07-14 DIAGNOSIS — I7143 Infrarenal abdominal aortic aneurysm, without rupture: Secondary | ICD-10-CM | POA: Diagnosis present

## 2022-07-14 DIAGNOSIS — Z87891 Personal history of nicotine dependence: Secondary | ICD-10-CM

## 2022-07-14 DIAGNOSIS — E669 Obesity, unspecified: Secondary | ICD-10-CM | POA: Diagnosis present

## 2022-07-14 DIAGNOSIS — I10 Essential (primary) hypertension: Secondary | ICD-10-CM | POA: Diagnosis present

## 2022-07-14 DIAGNOSIS — Z7902 Long term (current) use of antithrombotics/antiplatelets: Secondary | ICD-10-CM | POA: Diagnosis not present

## 2022-07-14 DIAGNOSIS — I5042 Chronic combined systolic (congestive) and diastolic (congestive) heart failure: Secondary | ICD-10-CM | POA: Diagnosis not present

## 2022-07-14 DIAGNOSIS — I70202 Unspecified atherosclerosis of native arteries of extremities, left leg: Secondary | ICD-10-CM | POA: Diagnosis not present

## 2022-07-14 DIAGNOSIS — Z7952 Long term (current) use of systemic steroids: Secondary | ICD-10-CM | POA: Diagnosis not present

## 2022-07-14 DIAGNOSIS — I252 Old myocardial infarction: Secondary | ICD-10-CM | POA: Diagnosis not present

## 2022-07-14 DIAGNOSIS — I5022 Chronic systolic (congestive) heart failure: Secondary | ICD-10-CM | POA: Diagnosis present

## 2022-07-14 DIAGNOSIS — J449 Chronic obstructive pulmonary disease, unspecified: Secondary | ICD-10-CM | POA: Diagnosis present

## 2022-07-14 DIAGNOSIS — E785 Hyperlipidemia, unspecified: Secondary | ICD-10-CM | POA: Diagnosis present

## 2022-07-14 DIAGNOSIS — Z955 Presence of coronary angioplasty implant and graft: Secondary | ICD-10-CM | POA: Diagnosis not present

## 2022-07-14 DIAGNOSIS — I70213 Atherosclerosis of native arteries of extremities with intermittent claudication, bilateral legs: Secondary | ICD-10-CM | POA: Diagnosis present

## 2022-07-14 DIAGNOSIS — Z951 Presence of aortocoronary bypass graft: Secondary | ICD-10-CM | POA: Diagnosis not present

## 2022-07-14 DIAGNOSIS — I429 Cardiomyopathy, unspecified: Secondary | ICD-10-CM | POA: Diagnosis present

## 2022-07-14 DIAGNOSIS — I771 Stricture of artery: Secondary | ICD-10-CM | POA: Diagnosis present

## 2022-07-14 HISTORY — PX: ENDOVASCULAR REPAIR/STENT GRAFT: CATH118280

## 2022-07-14 LAB — GLUCOSE, CAPILLARY
Glucose-Capillary: 111 mg/dL — ABNORMAL HIGH (ref 70–99)
Glucose-Capillary: 159 mg/dL — ABNORMAL HIGH (ref 70–99)

## 2022-07-14 LAB — BASIC METABOLIC PANEL
Anion gap: 8 (ref 5–15)
Anion gap: 9 (ref 5–15)
BUN: 10 mg/dL (ref 8–23)
BUN: 8 mg/dL (ref 8–23)
CO2: 27 mmol/L (ref 22–32)
CO2: 24 mmol/L (ref 22–32)
Calcium: 9.1 mg/dL (ref 8.9–10.3)
Calcium: 8.1 mg/dL — ABNORMAL LOW (ref 8.9–10.3)
Chloride: 99 mmol/L (ref 98–111)
Chloride: 101 mmol/L (ref 98–111)
Creatinine, Ser: 1.09 mg/dL (ref 0.61–1.24)
Creatinine, Ser: 0.95 mg/dL (ref 0.61–1.24)
GFR, Estimated: >60 mL/min (ref >60)
GFR, Estimated: >60 mL/min (ref >60)
Glucose, Bld: 108 mg/dL — ABNORMAL HIGH (ref 70–99)
Glucose, Bld: 147 mg/dL — ABNORMAL HIGH (ref 70–99)
Potassium: 2.5 mmol/L — CL (ref 3.5–5.1)
Potassium: 3.1 mmol/L — ABNORMAL LOW (ref 3.5–5.1)
Sodium: 134 mmol/L — ABNORMAL LOW (ref 135–145)
Sodium: 134 mmol/L — ABNORMAL LOW (ref 135–145)

## 2022-07-14 LAB — MAGNESIUM: Magnesium: 1.7 mg/dL (ref 1.7–2.4)

## 2022-07-14 LAB — PROTIME-INR
INR: 1.2 (ref 0.8–1.2)
Prothrombin Time: 15.3 seconds — ABNORMAL HIGH (ref 11.4–15.2)

## 2022-07-14 LAB — APTT: aPTT: 162 seconds (ref 24–36)

## 2022-07-14 LAB — POTASSIUM: Potassium: 3.2 mmol/L — ABNORMAL LOW (ref 3.5–5.1)

## 2022-07-14 LAB — MRSA NEXT GEN BY PCR, NASAL: MRSA by PCR Next Gen: NOT DETECTED

## 2022-07-14 SURGERY — ENDOVASCULAR STENT GRAFT (AAA)
Anesthesia: General

## 2022-07-14 MED ORDER — IODIXANOL 320 MG/ML IV SOLN
INTRAVENOUS | Status: DC | PRN
  Administered 2022-07-14: 45 mL via INTRA_ARTERIAL

## 2022-07-14 MED ORDER — ASPIRIN 81 MG PO TBEC
81.0000 mg | DELAYED_RELEASE_TABLET | Freq: Every day | ORAL | Status: DC
  Administered 2022-07-14 – 2022-07-15 (×2): 81 mg via ORAL
  Filled 2022-07-14 (×2): qty 1

## 2022-07-14 MED ORDER — ALBUTEROL SULFATE HFA 108 (90 BASE) MCG/ACT IN AERS
INHALATION_SPRAY | RESPIRATORY_TRACT | Status: AC
  Filled 2022-07-14: qty 6.7

## 2022-07-14 MED ORDER — FENTANYL CITRATE (PF) 100 MCG/2ML IJ SOLN
25.0000 ug | INTRAMUSCULAR | Status: DC | PRN
  Administered 2022-07-14 (×2): 25 ug via INTRAVENOUS

## 2022-07-14 MED ORDER — ONDANSETRON HCL 4 MG/2ML IJ SOLN
INTRAMUSCULAR | Status: AC
  Filled 2022-07-14: qty 2

## 2022-07-14 MED ORDER — ACETAMINOPHEN 650 MG RE SUPP: 325.0000 mg | RECTAL | Status: DC | PRN

## 2022-07-14 MED ORDER — EZETIMIBE 10 MG PO TABS
10.0000 mg | ORAL_TABLET | Freq: Every day | ORAL | Status: DC
  Administered 2022-07-15: 10 mg via ORAL
  Filled 2022-07-14: qty 1

## 2022-07-14 MED ORDER — GUAIFENESIN-DM 100-10 MG/5ML PO SYRP: 15.0000 mL | ORAL_SOLUTION | ORAL | Status: DC | PRN

## 2022-07-14 MED ORDER — RANOLAZINE ER 500 MG PO TB12
500.0000 mg | ORAL_TABLET | Freq: Two times a day (BID) | ORAL | Status: DC
  Administered 2022-07-14 – 2022-07-15 (×2): 500 mg via ORAL
  Filled 2022-07-14 (×2): qty 1

## 2022-07-14 MED ORDER — MORPHINE SULFATE (PF) 2 MG/ML IV SOLN
2.0000 mg | INTRAVENOUS | Status: DC | PRN
  Administered 2022-07-14: 2 mg via INTRAVENOUS
  Filled 2022-07-14: qty 1

## 2022-07-14 MED ORDER — LACTATED RINGERS IV SOLN: INTRAVENOUS | Status: DC

## 2022-07-14 MED ORDER — PANTOPRAZOLE SODIUM 40 MG PO TBEC
40.0000 mg | DELAYED_RELEASE_TABLET | Freq: Two times a day (BID) | ORAL | Status: DC
  Administered 2022-07-14 – 2022-07-15 (×2): 40 mg via ORAL
  Filled 2022-07-14 (×2): qty 1

## 2022-07-14 MED ORDER — FENTANYL CITRATE (PF) 100 MCG/2ML IJ SOLN
INTRAMUSCULAR | Status: DC | PRN
  Administered 2022-07-14 (×2): 50 ug via INTRAVENOUS

## 2022-07-14 MED ORDER — ADULT MULTIVITAMIN W/MINERALS CH
1.0000 | ORAL_TABLET | Freq: Every day | ORAL | Status: DC
  Administered 2022-07-14 – 2022-07-15 (×2): 1 via ORAL
  Filled 2022-07-14 (×2): qty 1

## 2022-07-14 MED ORDER — HYDROCORTISONE 10 MG PO TABS
10.0000 mg | ORAL_TABLET | Freq: Two times a day (BID) | ORAL | Status: DC
  Administered 2022-07-15 (×2): 10 mg via ORAL
  Filled 2022-07-14 (×3): qty 1

## 2022-07-14 MED ORDER — DAPAGLIFLOZIN PROPANEDIOL 10 MG PO TABS
10.0000 mg | ORAL_TABLET | Freq: Every day | ORAL | Status: DC
  Administered 2022-07-14 – 2022-07-15 (×2): 10 mg via ORAL
  Filled 2022-07-14 (×2): qty 1

## 2022-07-14 MED ORDER — ALUM & MAG HYDROXIDE-SIMETH 200-200-20 MG/5ML PO SUSP
15.0000 mL | ORAL | Status: DC | PRN
  Administered 2022-07-14: 30 mL via ORAL
  Filled 2022-07-14: qty 30

## 2022-07-14 MED ORDER — OXYCODONE-ACETAMINOPHEN 5-325 MG PO TABS
1.0000 | ORAL_TABLET | ORAL | Status: DC | PRN
  Administered 2022-07-14: 2 via ORAL
  Filled 2022-07-14: qty 2

## 2022-07-14 MED ORDER — FLUDROCORTISONE ACETATE 0.1 MG PO TABS
0.1000 mg | ORAL_TABLET | Freq: Every day | ORAL | Status: DC
  Administered 2022-07-15: 0.1 mg via ORAL
  Filled 2022-07-14 (×2): qty 1

## 2022-07-14 MED ORDER — LIDOCAINE HCL (CARDIAC) PF 100 MG/5ML IV SOSY
PREFILLED_SYRINGE | INTRAVENOUS | Status: DC | PRN
  Administered 2022-07-14: 80 mg via INTRAVENOUS

## 2022-07-14 MED ORDER — PHENYLEPHRINE HCL-NACL 20-0.9 MG/250ML-% IV SOLN
INTRAVENOUS | Status: AC
  Filled 2022-07-14: qty 250

## 2022-07-14 MED ORDER — ETOMIDATE 2 MG/ML IV SOLN
INTRAVENOUS | Status: AC
  Filled 2022-07-14: qty 10

## 2022-07-14 MED ORDER — ONDANSETRON HCL 4 MG/2ML IJ SOLN
INTRAMUSCULAR | Status: DC | PRN
  Administered 2022-07-14: 4 mg via INTRAVENOUS

## 2022-07-14 MED ORDER — FENTANYL CITRATE (PF) 100 MCG/2ML IJ SOLN
INTRAMUSCULAR | Status: AC
  Filled 2022-07-14: qty 2

## 2022-07-14 MED ORDER — HYDROCORTISONE SOD SUC (PF) 100 MG IJ SOLR
INTRAMUSCULAR | Status: AC
  Filled 2022-07-14: qty 2

## 2022-07-14 MED ORDER — CEFAZOLIN SODIUM-DEXTROSE 2-4 GM/100ML-% IV SOLN
INTRAVENOUS | Status: AC
  Filled 2022-07-14: qty 100

## 2022-07-14 MED ORDER — GABAPENTIN 100 MG PO CAPS
100.0000 mg | ORAL_CAPSULE | Freq: Two times a day (BID) | ORAL | Status: DC
  Administered 2022-07-14 – 2022-07-15 (×2): 100 mg via ORAL
  Filled 2022-07-14 (×2): qty 1

## 2022-07-14 MED ORDER — VITAMIN B-12 1000 MCG PO TABS
1000.0000 ug | ORAL_TABLET | Freq: Every day | ORAL | Status: DC
  Administered 2022-07-15: 1000 ug via ORAL
  Filled 2022-07-14: qty 1

## 2022-07-14 MED ORDER — ONDANSETRON HCL 4 MG/2ML IJ SOLN
4.0000 mg | Freq: Four times a day (QID) | INTRAMUSCULAR | Status: DC | PRN
  Filled 2022-07-14: qty 2

## 2022-07-14 MED ORDER — SODIUM CHLORIDE 0.9 % IV SOLN: 500.0000 mL | Freq: Once | INTRAVENOUS | Status: DC | PRN

## 2022-07-14 MED ORDER — SUGAMMADEX SODIUM 200 MG/2ML IV SOLN
INTRAVENOUS | Status: DC | PRN
  Administered 2022-07-14: 320 mg via INTRAVENOUS

## 2022-07-14 MED ORDER — DIPHENHYDRAMINE HCL 50 MG/ML IJ SOLN: 50.0000 mg | Freq: Once | INTRAMUSCULAR | Status: DC | PRN

## 2022-07-14 MED ORDER — HYDROCORTISONE SOD SUC (PF) 100 MG IJ SOLR
INTRAMUSCULAR | Status: DC | PRN
  Administered 2022-07-14: 100 mg via INTRAVENOUS

## 2022-07-14 MED ORDER — HEPARIN SODIUM (PORCINE) 1000 UNIT/ML IJ SOLN
INTRAMUSCULAR | Status: DC | PRN
  Administered 2022-07-14: 6000 [IU] via INTRAVENOUS

## 2022-07-14 MED ORDER — NITROGLYCERIN IN D5W 200-5 MCG/ML-% IV SOLN: 5.0000 ug/min | INTRAVENOUS | Status: DC

## 2022-07-14 MED ORDER — OXYCODONE HCL 5 MG PO TABS
5.0000 mg | ORAL_TABLET | Freq: Once | ORAL | Status: AC | PRN
  Administered 2022-07-14: 5 mg via ORAL

## 2022-07-14 MED ORDER — PHENOL 1.4 % MT LIQD: 1.0000 | OROMUCOSAL | Status: DC | PRN

## 2022-07-14 MED ORDER — POTASSIUM CHLORIDE 10 MEQ/100ML IV SOLN
10.0000 meq | INTRAVENOUS | Status: DC
  Administered 2022-07-14 (×2): 10 meq via INTRAVENOUS
  Filled 2022-07-14 (×2): qty 100

## 2022-07-14 MED ORDER — SODIUM CHLORIDE 0.9 % IV SOLN: INTRAVENOUS | Status: DC

## 2022-07-14 MED ORDER — ROSUVASTATIN CALCIUM 10 MG PO TABS
40.0000 mg | ORAL_TABLET | Freq: Every day | ORAL | Status: DC
  Administered 2022-07-15: 40 mg via ORAL
  Filled 2022-07-14: qty 4

## 2022-07-14 MED ORDER — NITROGLYCERIN 0.4 MG SL SUBL: 0.4000 mg | SUBLINGUAL_TABLET | SUBLINGUAL | Status: DC | PRN

## 2022-07-14 MED ORDER — CHLORHEXIDINE GLUCONATE 0.12 % MT SOLN
15.0000 mL | Freq: Once | OROMUCOSAL | Status: AC
  Administered 2022-07-14: 15 mL via OROMUCOSAL
  Filled 2022-07-14: qty 15

## 2022-07-14 MED ORDER — PHENYLEPHRINE HCL (PRESSORS) 10 MG/ML IV SOLN
INTRAVENOUS | Status: DC | PRN
  Administered 2022-07-14: 80 ug via INTRAVENOUS

## 2022-07-14 MED ORDER — ONDANSETRON HCL 4 MG/2ML IJ SOLN
4.0000 mg | Freq: Four times a day (QID) | INTRAMUSCULAR | Status: DC | PRN
  Administered 2022-07-14: 4 mg via INTRAVENOUS

## 2022-07-14 MED ORDER — SUCCINYLCHOLINE CHLORIDE 200 MG/10ML IV SOSY
PREFILLED_SYRINGE | INTRAVENOUS | Status: AC
  Filled 2022-07-14: qty 10

## 2022-07-14 MED ORDER — POTASSIUM CHLORIDE CRYS ER 20 MEQ PO TBCR
40.0000 meq | EXTENDED_RELEASE_TABLET | Freq: Three times a day (TID) | ORAL | Status: AC
  Administered 2022-07-14 (×2): 40 meq via ORAL
  Filled 2022-07-14 (×2): qty 2

## 2022-07-14 MED ORDER — PROPOFOL 10 MG/ML IV BOLUS
INTRAVENOUS | Status: AC
  Filled 2022-07-14: qty 20

## 2022-07-14 MED ORDER — GLYCOPYRROLATE 0.2 MG/ML IJ SOLN
INTRAMUSCULAR | Status: DC | PRN
  Administered 2022-07-14: .2 mg via INTRAVENOUS

## 2022-07-14 MED ORDER — DOPAMINE-DEXTROSE 3.2-5 MG/ML-% IV SOLN: 3.0000 ug/kg/min | INTRAVENOUS | Status: DC

## 2022-07-14 MED ORDER — CLOPIDOGREL BISULFATE 75 MG PO TABS
75.0000 mg | ORAL_TABLET | Freq: Every day | ORAL | Status: DC
  Administered 2022-07-14 – 2022-07-15 (×2): 75 mg via ORAL
  Filled 2022-07-14 (×2): qty 1

## 2022-07-14 MED ORDER — SUCCINYLCHOLINE CHLORIDE 200 MG/10ML IV SOSY
PREFILLED_SYRINGE | INTRAVENOUS | Status: DC | PRN
  Administered 2022-07-14: 120 mg via INTRAVENOUS

## 2022-07-14 MED ORDER — HYDRALAZINE HCL 20 MG/ML IJ SOLN: 5.0000 mg | INTRAMUSCULAR | Status: DC | PRN

## 2022-07-14 MED ORDER — ORAL CARE MOUTH RINSE: 15.0000 mL | Freq: Once | OROMUCOSAL | Status: AC

## 2022-07-14 MED ORDER — HYDROMORPHONE HCL 1 MG/ML IJ SOLN: 1.0000 mg | Freq: Once | INTRAMUSCULAR | Status: DC | PRN

## 2022-07-14 MED ORDER — ACETAMINOPHEN 10 MG/ML IV SOLN: 1000.0000 mg | Freq: Once | INTRAVENOUS | Status: DC | PRN

## 2022-07-14 MED ORDER — MIRTAZAPINE 15 MG PO TABS
15.0000 mg | ORAL_TABLET | Freq: Every day | ORAL | Status: DC
  Administered 2022-07-14: 15 mg via ORAL
  Filled 2022-07-14: qty 1

## 2022-07-14 MED ORDER — ROCURONIUM BROMIDE 10 MG/ML (PF) SYRINGE
PREFILLED_SYRINGE | INTRAVENOUS | Status: AC
  Filled 2022-07-14: qty 10

## 2022-07-14 MED ORDER — FAMOTIDINE IN NACL 20-0.9 MG/50ML-% IV SOLN
20.0000 mg | Freq: Two times a day (BID) | INTRAVENOUS | Status: DC
  Administered 2022-07-14 – 2022-07-15 (×2): 20 mg via INTRAVENOUS
  Filled 2022-07-14 (×2): qty 50

## 2022-07-14 MED ORDER — CARVEDILOL 3.125 MG PO TABS
3.1250 mg | ORAL_TABLET | Freq: Two times a day (BID) | ORAL | Status: DC
  Administered 2022-07-14 – 2022-07-15 (×2): 3.125 mg via ORAL
  Filled 2022-07-14 (×2): qty 1

## 2022-07-14 MED ORDER — GLYCOPYRROLATE 0.2 MG/ML IJ SOLN
INTRAMUSCULAR | Status: AC
  Filled 2022-07-14: qty 1

## 2022-07-14 MED ORDER — DOCUSATE SODIUM 100 MG PO CAPS: 100.0000 mg | ORAL_CAPSULE | Freq: Every day | ORAL | Status: DC

## 2022-07-14 MED ORDER — MAGNESIUM SULFATE 2 GM/50ML IV SOLN
2.0000 g | Freq: Every day | INTRAVENOUS | Status: AC | PRN
  Administered 2022-07-14: 2 g via INTRAVENOUS
  Filled 2022-07-14: qty 50

## 2022-07-14 MED ORDER — POTASSIUM CHLORIDE CRYS ER 20 MEQ PO TBCR: 20.0000 meq | EXTENDED_RELEASE_TABLET | Freq: Every day | ORAL | Status: DC | PRN

## 2022-07-14 MED ORDER — FUROSEMIDE 20 MG PO TABS
40.0000 mg | ORAL_TABLET | Freq: Every day | ORAL | Status: DC
  Administered 2022-07-14 – 2022-07-15 (×2): 40 mg via ORAL
  Filled 2022-07-14 (×2): qty 2

## 2022-07-14 MED ORDER — ROCURONIUM BROMIDE 100 MG/10ML IV SOLN
INTRAVENOUS | Status: DC | PRN
  Administered 2022-07-14: 40 mg via INTRAVENOUS

## 2022-07-14 MED ORDER — MIDAZOLAM HCL 2 MG/2ML IJ SOLN
INTRAMUSCULAR | Status: DC | PRN
  Administered 2022-07-14: 2 mg via INTRAVENOUS

## 2022-07-14 MED ORDER — LIDOCAINE HCL (PF) 2 % IJ SOLN
INTRAMUSCULAR | Status: AC
  Filled 2022-07-14: qty 5

## 2022-07-14 MED ORDER — CEFAZOLIN SODIUM-DEXTROSE 2-4 GM/100ML-% IV SOLN
2.0000 g | Freq: Three times a day (TID) | INTRAVENOUS | Status: AC
  Administered 2022-07-14 – 2022-07-15 (×2): 2 g via INTRAVENOUS
  Filled 2022-07-14 (×3): qty 100

## 2022-07-14 MED ORDER — METHYLPREDNISOLONE SODIUM SUCC 125 MG IJ SOLR: 125.0000 mg | Freq: Once | INTRAMUSCULAR | Status: DC | PRN

## 2022-07-14 MED ORDER — FAMOTIDINE 20 MG PO TABS: 40.0000 mg | ORAL_TABLET | Freq: Once | ORAL | Status: DC | PRN

## 2022-07-14 MED ORDER — OXYCODONE HCL 5 MG/5ML PO SOLN
5.0000 mg | Freq: Once | ORAL | Status: AC | PRN
  Filled 2022-07-14: qty 5

## 2022-07-14 MED ORDER — ONDANSETRON HCL 4 MG/2ML IJ SOLN: 4.0000 mg | Freq: Once | INTRAMUSCULAR | Status: DC | PRN

## 2022-07-14 MED ORDER — ACETAMINOPHEN 325 MG PO TABS: 325.0000 mg | ORAL_TABLET | ORAL | Status: DC | PRN

## 2022-07-14 MED ORDER — MIDAZOLAM HCL 2 MG/2ML IJ SOLN
INTRAMUSCULAR | Status: AC
  Filled 2022-07-14: qty 2

## 2022-07-14 MED ORDER — CEFAZOLIN SODIUM-DEXTROSE 2-4 GM/100ML-% IV SOLN
2.0000 g | INTRAVENOUS | Status: AC
  Administered 2022-07-14: 2 g via INTRAVENOUS

## 2022-07-14 SURGICAL SUPPLY — 23 items
CATH ACCU-VU SIZ PIG 5F 70CM (CATHETERS) IMPLANT
CATH BALLN CODA 9X100X32 (BALLOONS) IMPLANT
CATH BEACON 5 .035 65 KMP TIP (CATHETERS) IMPLANT
CLOSURE PERCLOSE PROSTYLE (VASCULAR PRODUCTS) IMPLANT
COVER DRAPE FLUORO 36X44 (DRAPES) IMPLANT
DEVICE SAFEGUARD 24CM (GAUZE/BANDAGES/DRESSINGS) IMPLANT
DEVICE TORQUE (MISCELLANEOUS) IMPLANT
EXCLUDER TNK LEG 28MX12X18 (Endovascular Graft) IMPLANT
EXCLUDER TRUNK LEG 28MX12X18 (Endovascular Graft) ×1 IMPLANT
GLIDEWIRE STIFF .35X180X3 HYDR (WIRE) IMPLANT
LEG CONTRALETERAL16X16X11.5 (Endovascular Graft) ×1 IMPLANT
PACK ANGIOGRAPHY (CUSTOM PROCEDURE TRAY) ×1 IMPLANT
SHEATH BRITE TIP 6FRX11 (SHEATH) IMPLANT
SHEATH BRITE TIP 8FRX11 (SHEATH) IMPLANT
SHEATH DRYSEAL FLEX 12FR 33CM (SHEATH) IMPLANT
SHEATH DRYSEAL FLEX 18FR 33CM (SHEATH) IMPLANT
SPONGE XRAY 4X4 16PLY STRL (MISCELLANEOUS) IMPLANT
STENT GRAFT CONTRALAT 16X11.5 (Endovascular Graft) IMPLANT
SYR MEDRAD MARK 7 150ML (SYRINGE) IMPLANT
TUBING CONNECTING 10 (TUBING) IMPLANT
TUBING CONTRAST HIGH PRESS 72 (TUBING) IMPLANT
WIRE AMPLATZ SSTIFF .035X260CM (WIRE) IMPLANT
WIRE EMERALD 3MM-J .035X150CM (WIRE) IMPLANT

## 2022-07-14 NOTE — Anesthesia Procedure Notes (Signed)
Procedure Name: Intubation Date/Time: 07/14/2022 8:15 AM  Performed by: Cammie Sickle, CRNAPre-anesthesia Checklist: Patient identified, Patient being monitored, Timeout performed, Emergency Drugs available and Suction available Patient Re-evaluated:Patient Re-evaluated prior to induction Oxygen Delivery Method: Circle system utilized Preoxygenation: Pre-oxygenation with 100% oxygen Induction Type: IV induction Ventilation: Mask ventilation without difficulty Laryngoscope Size: 3 and McGraph Grade View: Grade I Tube type: Oral Tube size: 7.5 mm Number of attempts: 1 Airway Equipment and Method: Stylet Placement Confirmation: ETT inserted through vocal cords under direct vision, positive ETCO2 and breath sounds checked- equal and bilateral Secured at: 22 cm Tube secured with: Tape Dental Injury: Teeth and Oropharynx as per pre-operative assessment  Comments: Smooth atraumatic intubation, no complications noted.

## 2022-07-14 NOTE — Transfer of Care (Signed)
Immediate Anesthesia Transfer of Care Note  Patient: [name]  Procedure(s) Performed: ENDOVASCULAR REPAIR/STENT GRAFT  Patient Location: PACU  Anesthesia Type:General  Level of Consciousness: awake, alert , and oriented  Airway & Oxygen Therapy: Patient Spontanous Breathing and Patient connected to face mask oxygen  Post-op Assessment: Report given to RN and Post -op Vital signs reviewed and stable  Post vital signs: Reviewed and stable  Last Vitals:  Vitals Value Taken Time  BP 127/80 07/14/22 1000  Temp 35.8 0955  Pulse 66 07/14/22 1003  Resp 12 07/14/22 1003  SpO2 95 % 07/14/22 1003  Vitals shown include unvalidated device data.  Last Pain:  Vitals:   07/14/22 0717  TempSrc: Oral         Complications: No notable events documented.

## 2022-07-14 NOTE — Op Note (Signed)
OPERATIVE NOTE   PROCEDURE: US guidance for vascular access, bilateral femoral arteries Catheter placement into aorta from bilateral femoral approaches Placement of a 28 mm proximal, 12 mm distal, 18 cm length Gore Excluder Endoprosthesis main body left down to the left external iliac artery with a 16 mm x 12 cm right contralateral limb ProGlide closure devices bilateral femoral arteries  PRE-OPERATIVE DIAGNOSIS: AAA  POST-OPERATIVE DIAGNOSIS: same  SURGEON: Leotis Pain, MD and Hortencia Pilar, MD - Co-surgeons  ANESTHESIA: general  ESTIMATED BLOOD LOSS: 25 cc  FINDING(S): 1.  AAA  SPECIMEN(S):  none  INDICATIONS:   [name] is a 72 y.o. male who presents with 5 cm AAA and some iliac artery occlusive disease. The anatomy was suitable for endovascular repair.  Risks and benefits of repair in an endovascular fashion were discussed and informed consent was obtained. Co-surgeons are used to expedite the procedure and reduce operative time as bilateral work needs to be done.  DESCRIPTION: After obtaining full informed written consent, the patient was brought back to the operating room and placed supine upon the operating table.  The patient received IV antibiotics prior to induction.  After obtaining adequate anesthesia, the patient was prepped and draped in the standard fashion for endovascular AAA repair.  We then began by gaining access to both femoral arteries with US guidance with me working on the left and Dr. Delana Meyer working on the right.  The femoral arteries were found to be patent and accessed without difficulty with a needle under ultrasound guidance without difficulty on each side and permanent images were recorded.  We then placed 2 proglide devices on each side in a pre-close fashion and placed 8 French sheaths. The patient was then given 6000 units of intravenous heparin. The Pigtail catheter was placed into the aorta from the right side. Using this image, we  selected a 28 mm proximal, 12 mm distal, 18 cm length Main body device.  This needed to go down to the left external iliac artery due to the short length of the left common iliac artery to get successful exclusion of the aneurysm.  This was beyond an occluded left internal iliac artery so coil embolization was not required on the left internal iliac artery.  Over a stiff wire, an 79 French sheath was placed up the left. The main body was then placed through the 18 French sheath. A Kumpe catheter was placed up the right side and a magnified image at the renal arteries was performed. The main body was then deployed just below the lowest renal artery. The Kumpe catheter was used to cannulate the contralateral gate without difficulty and successful cannulation was confirmed by twirling the pigtail catheter in the main body. We then placed a stiff wire and a retrograde arteriogram was performed through the right femoral sheath. We upsized to the 12 [place] sheath on the right side for the contralateral limb and a 16 mm diameter x 12 cm length right iliac limb was selected and deployed. The main body deployment was then completed. Based off the angiographic findings, extension limbs were not necessary.  We had already extended down to the left external iliac artery to get successful exclusion of the aneurysm on the left side due to the short length of the left common iliac artery. All junction points and seals zones were treated with the compliant balloon. The pigtail catheter was then replaced and a completion angiogram was performed.  A possible small type II endoleak was  detected on completion angiography.  No type I or III endoleaks were identified.  The renal arteries were found to be widely patent.  The right hypogastric artery remained widely patent. At this point we elected to terminate the procedure. We secured the pro glide devices for hemostasis on the femoral arteries. The skin incision was closed with a 4-0  Monocryl. Dermabond and pressure dressing were placed. The patient was taken to the recovery room in stable condition having tolerated the procedure well.  COMPLICATIONS: none  CONDITION: stable  Leotis Pain  07/14/2022, 9:46 AM   This note was created with Dragon Medical transcription system. Any errors in dictation are purely unintentional.

## 2022-07-14 NOTE — Progress Notes (Signed)
PHARMACY CONSULT NOTE  Pharmacy Consult for Electrolyte Monitoring and Replacement   Recent Labs: Potassium (mmol/L)  Date Value  07/14/2022 3.2 (L)   Magnesium (mg/dL)  Date Value  07/14/2022 1.7   Calcium (mg/dL)  Date Value  07/14/2022 8.1 (L)   Albumin (g/dL)  Date Value  07/08/2022 3.9  08/11/2015 4.5   Phosphorus (mg/dL)  Date Value  02/20/2021 2.5   Sodium (mmol/L)  Date Value  07/14/2022 134 (L)  06/04/2021 132 (L)    Assessment: 72 y.o. male  PMH of CAD, MI, adrenal insufficiency, AAA, HLD, HTN, PAD, CHF presents for AAA. Pharmacy is asked to follow and replace electrolytes while in CCU   Goal of Therapy:  Electrolytes WNL  Plan:  ---2 grams IV magnesium sulfate x 1 ---40 mEq oral KCl x 2 ---recheck electrolytes in am  Dallie Piles ,PharmD Clinical Pharmacist 07/14/2022 2:49 PM

## 2022-07-14 NOTE — OR Nursing (Signed)
Received report from lab that K+ 2.5. Vascular lab notified, they will notify md and anesthesia

## 2022-07-14 NOTE — Plan of Care (Signed)
  Problem: Education: Goal: Knowledge of discharge needs will improve Outcome: Not Progressing   Problem: Clinical Measurements: Goal: Postoperative complications will be avoided or minimized Outcome: Not Progressing   Problem: Respiratory: Goal: Ability to achieve and maintain a regular respiratory rate will improve Outcome: Not Progressing   Problem: Skin Integrity: Goal: Demonstration of wound healing without infection will improve Outcome: Not Progressing   Problem: Education: Goal: Knowledge of General Education information will improve Description: Including pain rating scale, medication(s)/side effects and non-pharmacologic comfort measures Outcome: Not Progressing   Problem: Health Behavior/Discharge Planning: Goal: Ability to manage health-related needs will improve Outcome: Not Progressing   Problem: Clinical Measurements: Goal: Ability to maintain clinical measurements within normal limits will improve Outcome: Not Progressing Goal: Will remain free from infection Outcome: Not Progressing Goal: Diagnostic test results will improve Outcome: Not Progressing Goal: Respiratory complications will improve Outcome: Not Progressing Goal: Cardiovascular complication will be avoided Outcome: Not Progressing   Problem: Activity: Goal: Risk for activity intolerance will decrease Outcome: Not Progressing   Problem: Nutrition: Goal: Adequate nutrition will be maintained Outcome: Not Progressing   Problem: Coping: Goal: Level of anxiety will decrease Outcome: Not Progressing   Problem: Elimination: Goal: Will not experience complications related to bowel motility Outcome: Not Progressing Goal: Will not experience complications related to urinary retention Outcome: Not Progressing   Problem: Pain Managment: Goal: General experience of comfort will improve Outcome: Not Progressing   Problem: Safety: Goal: Ability to remain free from injury will improve Outcome:  Not Progressing   Problem: Skin Integrity: Goal: Risk for impaired skin integrity will decrease Outcome: Not Progressing

## 2022-07-14 NOTE — Interval H&P Note (Signed)
History and Physical Interval Note:  07/14/2022 7:52 AM  [name]  has presented today for surgery, with the diagnosis of Endovascular Stent Repair    GORE   AAA repair.  The various methods of treatment have been discussed with the patient and family. After consideration of risks, benefits and other options for treatment, the patient has consented to  Procedure(s): ENDOVASCULAR REPAIR/STENT GRAFT (N/A) as a surgical intervention.  The patient's history has been reviewed, patient examined, no change in status, stable for surgery.  I have reviewed the patient's chart and labs.  Questions were answered to the patient's satisfaction.     Leotis Pain

## 2022-07-14 NOTE — Op Note (Signed)
OPERATIVE NOTE   PROCEDURE: US guidance for vascular access, bilateral femoral arteries Catheter placement into aorta from bilateral femoral approaches Placement of a C3 28 x 12 x 18 Gore Excluder Endoprosthesis main body with a 16 x 12 contralateral limb ProGlide closure devices bilateral femoral arteries  PRE-OPERATIVE DIAGNOSIS: AAA  POST-OPERATIVE DIAGNOSIS: same  SURGEON: Hortencia Pilar, MD and Leotis Pain, MD - Co-surgeons  ANESTHESIA: general  ESTIMATED BLOOD LOSS: 10 cc  FINDING(S): 1.  AAA  SPECIMEN(S):  none  INDICATIONS:   [name] is a 72 y.o. y.o. male who presents with a large abdominal aortic aneurysm.  CT imaging demonstrates this is an acceptable stent graft case and he is therefore undergoing endovascular repair to prevent lethal rupture.  Risk benefits as well as alternative therapies have been reviewed with the patient all questions have been answered patient agrees to proceed with aneurysm stent graft placement.  DESCRIPTION: After obtaining full informed written consent, the patient was brought back to the operating room and placed supine upon the operating table.  The patient received IV antibiotics prior to induction.  After obtaining adequate anesthesia, the patient was prepped and draped in the standard fashion for endovascular AAA repair.  Co-surgeons are required because this is a complex bilateral procedure with work being performed simultaneously from both the right femoral and left femoral approach.  This also expedites the procedure making a shorter operative time reducing complications and improving patient safety.  We then began by gaining access to both femoral arteries with US guidance with me working on the patient's right and Dr. Lucky Cowboy working on the left of the patient.  The femoral arteries were found to be patent and accessed without difficulty with a needle under ultrasound guidance without difficulty on each side and permanent images  were recorded.  We then placed 2 proglide devices on each side in a pre-close fashion and placed 8 French sheaths.  The patient was then given 6000 units of intravenous heparin.   The Pigtail catheter was placed into the aorta from the right side. Using this image, we selected a 28 x 12 x 18 Main body device.  Over a stiff wire, an 33 French sheath was placed. The main body was then placed through the 18 French sheath. A Kumpe catheter was placed up the right side and a magnified image at the renal arteries was performed. The main body was then deployed just below the lowest renal artery. The Kumpe catheter was used to cannulate the contralateral gate without difficulty and successful cannulation was confirmed by twirling the pigtail catheter in the main body. We then placed a stiff wire and a retrograde arteriogram was performed through the right femoral sheath. We upsized to the 12 [place] sheath for the contralateral limb and a 16 x 12 limb was selected and deployed. The main body deployment was then completed.  On the left side with the main body the ipsilateral limb was extended into the external iliac artery in order to appropriately treat the aneurysmal disease.  This was also acceptable as the left internal iliac artery was occluded chronically.   All junction points and seals zones were treated with the compliant balloon.   The pigtail catheter was then replaced and a completion angiogram was performed.   Possible faint type II endoleak was detected on completion angiography. The renal arteries were found to be widely patent.  Previously placed left renal artery stent demonstrates moderate in-stent restenosis but is patent.  The right internal iliac artery remains patent with extensive cross-filling to the left side noted on completion angio.  At this point we elected to terminate the procedure. We secured the pro glide devices for hemostasis on the femoral arteries. The skin incision was closed with  a 4-0 Monocryl. Dermabond and pressure dressing were placed. The patient was taken to the recovery room in stable condition having tolerated the procedure well.  COMPLICATIONS: none  CONDITION: stable  Hortencia Pilar  07/14/2022, 9:43 AM

## 2022-07-14 NOTE — Anesthesia Preprocedure Evaluation (Addendum)
Anesthesia Evaluation  Patient identified by MRN, date of birth, ID band Patient awake    Reviewed: Allergy & Precautions, NPO status , Patient's Chart, lab work & pertinent test results  History of Anesthesia Complications Negative for: history of anesthetic complications  Airway Mallampati: I   Neck ROM: Full    Dental  (+) Edentulous Upper, Edentulous Lower   Pulmonary COPD, former smoker (quit 1995)    + decreased breath sounds      Cardiovascular hypertension, + CAD (s/p MI, CABG, and stents on Plavix), + Peripheral Vascular Disease (AAA; renal artery stenosis) and +CHF (cardiomyopathy, EF 35%)  Normal cardiovascular exam Rhythm:Regular Rate:Normal  ECG 07/05/22:  Normal sinus rhythm rate 64 bpm nonspecific ST abnormality, old inferior MI   Neuro/Psych Alcohol use disorder, 3 beers daily    GI/Hepatic ,GERD  ,,  Endo/Other  On daily steroids for Addison's disease; obesity   Renal/GU negative Renal ROS     Musculoskeletal   Abdominal   Peds  Hematology negative hematology ROS (+)   Anesthesia Other Findings Cardiology note 07/05/22:  CAD Prior non-STEMI, cardiac catheterization reviewed by interventional team, EF 30 to 35% No plan for ischemic workup at this time Denies anginal symptoms   Chronic systolic CHF Recent hospitalization December 2023, EF 30 to 35% Recommend he start Jardiance/Farxiga, at that point decrease Lasix down to 20 mg daily with BMP in several weeks time   PAD: AAA measuring 4.77 on CT scan, scheduled for endograft placement with Dr. Lucky Cowboy Acceptable risk for procedure, no further cardiac testing needed   Renal artery stenosis: prior stent on left 40 to 59% disease/stenosis bilaterally Followed by vascular Cholesterol at goal   ETOH Cessation recommended   Essential hypertension Blood pressure low, adrenal insufficiency on Florinef   Hyperlipidemia Cholesterol is at goal on the  current lipid regimen. No changes to the medications were made.   SOB with exertion CHF and COPD Long smoking hx, 3 ppd, quit 1995 Currently on Lasix 40 daily, plan to decrease down to 20 mg daily once he starts SGLT2 inhibitor   Back pain Chronic issue, stable at this time   Reproductive/Obstetrics                              Anesthesia Physical Anesthesia Plan  ASA: 3  Anesthesia Plan: General   Post-op Pain Management:    Induction: Intravenous  PONV Risk Score and Plan: 2 and Ondansetron, Dexamethasone and Treatment may vary due to age or medical condition  Airway Management Planned: Oral ETT  Additional Equipment: Arterial line  Intra-op Plan:   Post-operative Plan: Extubation in OR  Informed Consent: I have reviewed the patients History and Physical, chart, labs and discussed the procedure including the risks, benefits and alternatives for the proposed anesthesia with the patient or authorized representative who has indicated his/her understanding and acceptance.     Dental advisory given  Plan Discussed with: CRNA  Anesthesia Plan Comments: (Patient consented for risks of anesthesia including but not limited to:  - adverse reactions to medications - damage to eyes, teeth, lips or other oral mucosa - nerve damage due to positioning  - sore throat or hoarseness - damage to heart, brain, nerves, lungs, other parts of body or loss of life  Informed patient about role of CRNA in peri- and intra-operative care.  Patient voiced understanding.)         Anesthesia Quick Evaluation

## 2022-07-14 NOTE — Anesthesia Postprocedure Evaluation (Signed)
Anesthesia Post Note  Patient: [name]  Procedure(s) Performed: ENDOVASCULAR REPAIR/STENT GRAFT  Patient location during evaluation: PACU Anesthesia Type: General Level of consciousness: awake and alert, oriented and patient cooperative Pain management: pain level controlled Vital Signs Assessment: post-procedure vital signs reviewed and stable Respiratory status: spontaneous breathing, nonlabored ventilation and respiratory function stable Cardiovascular status: blood pressure returned to baseline and stable Postop Assessment: adequate PO intake Anesthetic complications: no   No notable events documented.   Last Vitals:  Vitals:   07/14/22 1045 07/14/22 1100  BP: 129/74 134/70  Pulse: 62 67  Resp: 12 19  Temp:    SpO2: 96% 96%    Last Pain:  Vitals:   07/14/22 1100  TempSrc:   PainSc: [place]

## 2022-07-15 ENCOUNTER — Ambulatory Visit: Payer: Medicare Other | Admitting: "Endocrinology

## 2022-07-15 ENCOUNTER — Encounter: Payer: Self-pay | Admitting: Vascular Surgery

## 2022-07-15 DIAGNOSIS — Z9889 Other specified postprocedural states: Secondary | ICD-10-CM

## 2022-07-15 DIAGNOSIS — Z95828 Presence of other vascular implants and grafts: Secondary | ICD-10-CM

## 2022-07-15 LAB — BASIC METABOLIC PANEL
Anion gap: 5 (ref 5–15)
BUN: 6 mg/dL — ABNORMAL LOW (ref 8–23)
CO2: 26 mmol/L (ref 22–32)
Calcium: 8.2 mg/dL — ABNORMAL LOW (ref 8.9–10.3)
Chloride: 102 mmol/L (ref 98–111)
Creatinine, Ser: 0.87 mg/dL (ref 0.61–1.24)
GFR, Estimated: >60 mL/min (ref >60)
Glucose, Bld: 113 mg/dL — ABNORMAL HIGH (ref 70–99)
Potassium: 3.4 mmol/L — ABNORMAL LOW (ref 3.5–5.1)
Sodium: 133 mmol/L — ABNORMAL LOW (ref 135–145)

## 2022-07-15 LAB — CBC
HCT: 29.5 % — ABNORMAL LOW (ref 39.0–52.0)
Hemoglobin: 9.4 g/dL — ABNORMAL LOW (ref 13.0–17.0)
MCH: 25.2 pg — ABNORMAL LOW (ref 26.0–34.0)
MCHC: 31.9 g/dL (ref 30.0–36.0)
MCV: 79.1 fL — ABNORMAL LOW (ref 80.0–100.0)
Platelets: 239 10*3/uL (ref 150–400)
RBC: 3.73 MIL/uL — ABNORMAL LOW (ref 4.22–5.81)
RDW: 20.7 % — ABNORMAL HIGH (ref 11.5–15.5)
WBC: 7.7 10*3/uL (ref 4.0–10.5)
nRBC: 0 % (ref 0.0–0.2)

## 2022-07-15 LAB — MAGNESIUM: Magnesium: 2.3 mg/dL (ref 1.7–2.4)

## 2022-07-15 MED ORDER — CHLORHEXIDINE GLUCONATE CLOTH 2 % EX PADS
6.0000 | MEDICATED_PAD | Freq: Every day | CUTANEOUS | Status: DC
  Administered 2022-07-15: 6 via TOPICAL

## 2022-07-15 MED ORDER — POTASSIUM CHLORIDE CRYS ER 20 MEQ PO TBCR
40.0000 meq | EXTENDED_RELEASE_TABLET | ORAL | Status: AC
  Administered 2022-07-15 (×2): 40 meq via ORAL
  Filled 2022-07-15 (×2): qty 2

## 2022-07-15 NOTE — Discharge Summary (Signed)
[facility]    Discharge Summary    Patient ID:  [name] MRN: [id number] DOB/AGE: 06/23/1950 72 y.o.  Admit date: 07/14/2022 Discharge date: 07/15/2022 Date of Surgery: 07/14/2022 Surgeon: Surgeon(s): Dew, Erskine Squibb, MD Schnier, Dolores Lory, MD  Admission Diagnosis: AAA (abdominal aortic aneurysm) without rupture [facility]) [I71.40]  Discharge Diagnoses:  AAA (abdominal aortic aneurysm) without rupture ([place]) [I71.40]  Secondary Diagnoses: Past Medical History:  Diagnosis Date   AAA (abdominal aortic aneurysm) ([place])    a. Duplex 01/2012: stable infrarenal saccular AAA at 3.3cm x 3.2xm (f/u recommended 01/2013 per Dr. Rockey Situ)   Addison's disease [facility]) 04/22/2022   Alcohol abuse    Anginal pain ([place])    CAD (coronary artery disease)    a. 1995 s/p CABG x 4: LIMA->LAD, VG->RI (known to be occluded), VG->AM->PDA;  b. 05/2002 Inf STEMI: VG->AM->PDA 100% treated w/ 2 BMS complicated by acute thrombosis req 3 BMS;  c. 07/2003 DES to  LAD & LCX (VG's to PDA & RI 100%);  d. 12/2010 Acute MI ([place]): DES to LCX & LM , LIMA ok,;  e.12/2011 Cath: LM/LCX stents ok , LIMA patent.   Cardiomyopathy ([place])    a. EF 35% by cath 2015.   CHF (congestive heart failure) ([facility])    COPD (chronic obstructive pulmonary disease) ([place])    Diverticulitis    1/06 Diverticulitis--CT of pelvis--diffuse sigmoid divertic   Dyspnea    Dysrhythmia    GERD (gastroesophageal reflux disease)    Hyperlipidemia    Hypertension    Myocardial infarction [facility])    PAD (peripheral artery disease) ([place])    a. external iliac and mesenteric stenosis noted by noninvasive imaging.   Renal artery stenosis ([place])    a. 03/2011 PTA and stenting of L RA. b. last duplex 2016 with stable 1-59% bilateral RAS, incidental >50% R EIA stenosis    Procedure(s): ENDOVASCULAR REPAIR/STENT GRAFT  Discharged Condition: good  HPI:  [name] is a 72 y.o. male who presents with 5 cm AAA and some iliac artery  occlusive disease. The anatomy was suitable for endovascular repair. He is now POD #1 from Aortogram with placement of an 18cm length Gore Excluder Endoprosthesis. Patient recovering as expected. No complications post procedure to note. No complaints overnight. No narcotic pain medications needed post operative. Vitals all remain stable. Patient to be discharged home.   Hospital Course:  [name] is a 72 y.o. male is S/P Bilateral Extubated: POD # 0 Physical Exam:  Alert notes x3, no acute distress Face: Symmetrical.  Tongue is midline. Neck: Trachea is midline.  No swelling or bruising. Cardiovascular: Regular rate and rhythm Pulmonary: Clear to auscultation bilaterally Abdomen: Soft, nontender, nondistended Right groin access: Clean dry and intact.  No swelling or drainage noted Left groin access: Clean dry and intact.  No swelling or drainage noted Left lower extremity: Thigh soft.  Calf soft.  Extremities warm distally toes.  Hard to palpate pedal pulses however the foot is warm is her good capillary refill. Right lower extremity: Thigh soft.  Calf soft.  Extremities warm distally toes.  Hard to palpate pedal pulses however the foot is warm is her good capillary refill. Neurological: No deficits noted   Post-op wounds:  clean, dry, intact or healing well  Pt. Ambulating, voiding and taking PO diet without difficulty. Pt pain controlled with PO pain meds.  Labs:  As below  Complications: none  Consults:    Significant Diagnostic Studies: CBC  Lab Results  Component Value Date   WBC 7.7 07/15/2022   HGB 9.4 (L) 07/15/2022   HCT 29.5 (L) 07/15/2022   MCV 79.1 (L) 07/15/2022   PLT 239 07/15/2022    BMET    Component Value Date/Time   NA 133 (L) 07/15/2022 0434   NA 132 (L) 06/04/2021 1110   K 3.4 (L) 07/15/2022 0434   CL 102 07/15/2022 0434   CO2 26 07/15/2022 0434   GLUCOSE 113 (H) 07/15/2022 0434   BUN 6 (L) 07/15/2022 0434   BUN 6 (L) 06/04/2021 1110    CREATININE 0.87 07/15/2022 0434   CREATININE 0.84 01/07/2017 1120   CALCIUM 8.2 (L) 07/15/2022 0434   GFRNONAA >60 07/15/2022 0434   GFRAA >60 01/18/2020 1408   COAG Lab Results  Component Value Date   INR 1.2 07/14/2022   INR 1.1 07/08/2022   INR 1.22 10/28/2017     Disposition:  Discharge to :Home  Allergies as of 07/15/2022       Reactions   Metoprolol Tartrate Hives, Itching   Quinolones Other (See Comments)   Contraindicated with AAA   Metoclopramide Nausea Only        Medication List     TAKE these medications    aspirin 81 MG tablet Take 81 mg by mouth daily.   carvedilol 3.125 MG tablet Commonly known as: COREG TAKE 1 TABLET BY MOUTH 2 TIMES DAILY.   clopidogrel 75 MG tablet Commonly known as: PLAVIX Take 1 tablet (75 mg total) by mouth daily.   cyanocobalamin 1000 MCG tablet Commonly known as: VITAMIN B12 Take 1,000 mcg by mouth daily.   ezetimibe 10 MG tablet Commonly known as: ZETIA Take 1 tablet (10 mg total) by mouth daily.   Farxiga 10 MG Tabs tablet Generic drug: dapagliflozin propanediol Take 10 mg by mouth daily.   fludrocortisone 0.1 MG tablet Commonly known as: FLORINEF Take 1 tablet (0.1 mg total) by mouth daily.   furosemide 40 MG tablet Commonly known as: Lasix Take 1 tablet (40 mg total) by mouth daily. Hold if weight under 163#   gabapentin 100 MG capsule Commonly known as: NEURONTIN Take 1 capsule (100 mg total) by mouth 2 (two) times daily.   hydrocortisone 10 MG tablet Commonly known as: CORTEF Take 1 tablet daily at 8 AM, 1 tablet daily at noon   mirtazapine 15 MG tablet Commonly known as: REMERON TAKE 1 TABLET BY MOUTH AT BEDTIME   multivitamin with minerals Tabs tablet Take 1 tablet by mouth daily.   nitroGLYCERIN 0.4 MG SL tablet Commonly known as: NITROSTAT PLACE 1 TABLET UNDER THE TONGUE EVERY 5 MINUTES AS NEEDED FOR CHEST PAIN. What changed: See the new instructions.   pantoprazole 40 MG  tablet Commonly known as: PROTONIX TAKE 1 TABLET BY MOUTH TWICE DAILY   ranolazine 500 MG 12 hr tablet Commonly known as: RANEXA Take 1 tablet (500 mg total) by mouth 2 (two) times daily.   rosuvastatin 40 MG tablet Commonly known as: CRESTOR Take 1 tablet (40 mg total) by mouth daily.       Verbal and written Discharge instructions given to the patient. Wound care per Discharge AVS  Follow-up Information     Kris Hartmann, NP Follow up in 1 month(s).   Specialty: Vascular Surgery Why: Arna Medici with [facility] Contact information: [place] [address] [phone]                 Signed: Drema Pry,  NP  07/15/2022, 12:06 PM

## 2022-07-15 NOTE — Discharge Instructions (Signed)
No Heavy Lifting for 8 weeks. Do not lift more than 10 pounds. Nothing more than a gallon of milk.   No shower today. Okay to shower tomorrow 07/16/22.  Change dressings to bilateral groins after shower tomorrow with band aid to each groin for the next 3 days.

## 2022-07-15 NOTE — Progress Notes (Signed)
Discharge paperwork and instructions provided to patient. 2 PIVs removed, guaze placed, sites clean, dry, and intact. Patient is satisfied that all belongings have been returned. Patient transported to medical mall entrance via wheelchair.

## 2022-07-15 NOTE — Progress Notes (Signed)
PHARMACY CONSULT NOTE  Pharmacy Consult for Electrolyte Monitoring and Replacement   Recent Labs: Potassium (mmol/L)  Date Value  07/15/2022 3.4 (L)   Magnesium (mg/dL)  Date Value  07/15/2022 2.3   Calcium (mg/dL)  Date Value  07/15/2022 8.2 (L)   Albumin (g/dL)  Date Value  07/08/2022 3.9  08/11/2015 4.5   Phosphorus (mg/dL)  Date Value  02/20/2021 2.5   Sodium (mmol/L)  Date Value  07/15/2022 133 (L)  06/04/2021 132 (L)    Assessment: 72 y.o. male  PMH of CAD, MI, adrenal insufficiency, AAA, HLD, HTN, PAD, CHF presents for AAA. Pharmacy is asked to follow and replace electrolytes while in CCU   Goal of Therapy:  K >/= 4.0 and Mg >/= 2.0  Plan: ---40 mEq oral KCl x 2 ---recheck electrolytes in am  Delena Bali ,PharmD Clinical Pharmacist 07/15/2022 7:04 AM

## 2022-07-16 ENCOUNTER — Telehealth: Payer: Self-pay | Admitting: *Deleted

## 2022-07-16 ENCOUNTER — Telehealth (INDEPENDENT_AMBULATORY_CARE_PROVIDER_SITE_OTHER): Payer: Self-pay | Admitting: Vascular Surgery

## 2022-07-16 ENCOUNTER — Other Ambulatory Visit (INDEPENDENT_AMBULATORY_CARE_PROVIDER_SITE_OTHER): Payer: Self-pay | Admitting: Nurse Practitioner

## 2022-07-16 MED ORDER — HYDROCODONE-ACETAMINOPHEN 5-325 MG PO TABS: 1.0000 | ORAL_TABLET | ORAL | 0 refills | Status: DC | PRN

## 2022-07-16 NOTE — Telephone Encounter (Signed)
Pain medication sent ?

## 2022-07-16 NOTE — Telephone Encounter (Signed)
Pt called to make a f/u appt after his AAA repair - made appt for 1 month with Eulogio Ditch, NP per notes. Pt called back to inquire about any pain meds. Pain score now is 6/10. Pharmacy - [facility] on [facility].

## 2022-07-16 NOTE — Transitions of Care (Post Inpatient/ED Visit) (Signed)
   07/16/2022  Name: [name] MRN: [id number] DOB: 08/22/1950  Today's TOC FU Call Status: Today's TOC FU Call Status:: Successful TOC FU Call Competed TOC FU Call Complete Date: 07/16/22  Transition Care Management Follow-up Telephone Call Date of Discharge: 07/15/22 Discharge Facility: [facility]) Type of Discharge: Inpatient Admission Primary Inpatient Discharge Diagnosis:: abdominal aortic aneurysm without rupture How have you been since you were released from the hospital?: Better (but I did not get any pain medication and I am having a little pain. I have called the vascular surgeon) Any questions or concerns?: No  Items Reviewed: Did you receive and understand the discharge instructions provided?: Yes Medications obtained and verified?: Yes (Medications Reviewed) Any new allergies since your discharge?: No Dietary orders reviewed?: No Do you have support at home?: Yes People in Home: spouse Name of Support/Comfort Primary Source: [employer] and Equipment/Supplies: [place] Ordered?: No Any new equipment or medical supplies ordered?: No  Functional Questionnaire: Do you need assistance with bathing/showering or dressing?: No Do you need assistance with meal preparation?: No Do you need assistance with eating?: No Do you have difficulty maintaining continence: No Do you need assistance with getting out of bed/getting out of a chair/moving?: No Do you have difficulty managing or taking your medications?: No  Follow up appointments reviewed: PCP Follow-up appointment confirmed?: [facility] Follow-up appointment confirmed?: Yes Date of Specialist follow-up appointment?: 08/17/22 (Per surgeon scheduled month out) Follow-Up Specialty Provider:: Dr Arna Medici [id number] 7:30 Do you need transportation to your follow-up appointment?: No Do you understand care options if your condition(s) worsen?: Yes-patient  verbalized understanding  SDOH Interventions Today    Flowsheet Row Most Recent Value  SDOH Interventions   Food Insecurity Interventions Intervention Not Indicated  Housing Interventions Intervention Not Indicated  Transportation Interventions Intervention Not Indicated      Interventions Today    Flowsheet Row Most Recent Value  General Interventions   General Interventions Discussed/Reviewed General Interventions Discussed, General Interventions Reviewed  Doctor Visits Discussed/Reviewed Specialist  PCP/Specialist Visits Compliance with follow-up visit       [facility] [facility] [phone]

## 2022-07-22 ENCOUNTER — Ambulatory Visit: Payer: Self-pay

## 2022-07-22 ENCOUNTER — Telehealth (INDEPENDENT_AMBULATORY_CARE_PROVIDER_SITE_OTHER): Payer: Self-pay

## 2022-07-22 NOTE — Telephone Encounter (Signed)
Patient spouse left a message requesting if her husband could start back driving. Patient had endovascular repair/stent graft on 07/14/2022. Please Advise

## 2022-07-22 NOTE — Patient Outreach (Signed)
  Care Coordination   Follow Up Visit Note   07/22/2022 Name: SAJAD PENTZ MRN: [id number] DOB: 1951-01-01  KINAN GIACHETTI is a 72 y.o. year old male who sees Venia Carbon, MD for primary care. I spoke with  [name] by phone today.  What matters to the patients health and wellness today?  Patient states he is doing well since AAA repair surgery on 07/14/22. He denies any pain.  Patient confirmed follow up visit with surgeon is 08/17/22.  Patient reports he removed the band aid to his incisions on yesterday and denies any signs of infection.  Patient reports today's weight is 170 lbs.  Denies any increase symptoms related to HF/ COPD. Patient reports blood pressure ranges 99/67 to 130's/80's.  Patient states he would like to know when he can start driving.     Goals Addressed             This Visit's Progress    Post hospital follow up for AAA repair and management  heart failure, COPD, HLD       Interventions Today    Flowsheet Row Most Recent Value  Chronic Disease   Chronic disease during today's visit Congestive Heart Failure (CHF), Chronic Obstructive Pulmonary Disease (COPD), Other  [HLD, status post AAA repair]  General Interventions   General Interventions Discussed/Reviewed General Interventions Reviewed  [Evaluation of current treatment plan related to AAA repair, HF, COPD, HLD and patients adherence to plan as established by provider.  Assessed for post surgical pain, signs/ symptoms of infection, HF/COPD signs/ symptoms. Assessed for home BP/ weight.]  Doctor Visits Discussed/Reviewed Doctor Visits Reviewed  [facility] scheduled/ upcoming appointment.  Confirmed patient has surgical follow up visit scheduled. Advised to call surgeon's office to discuss when able to drive.]  Exercise Interventions   Exercise Discussed/Reviewed Physical Activity  Physical Activity Discussed/Reviewed Physical Activity Reviewed  [Discussed physical activity as recommended by  surgeon.]  Education Interventions   Education Provided Provided Education  [Patient advised to report any new or ongoing symptoms to surgeon/ primary care provider.]  Pharmacy Interventions   Pharmacy Dicussed/Reviewed Pharmacy Topics Reviewed  [medications reviewed and compliance discussed.]                SDOH assessments and interventions completed:  No     Care Coordination Interventions:  Yes, provided   Follow up plan: Follow up call scheduled for 08/24/22    Encounter Outcome:  Pt. Visit Completed   Quinn Plowman RN,BSN,CCM [place] [phone] direct line

## 2022-07-22 NOTE — Telephone Encounter (Signed)
Patient spouse was notified that her husband can return back to driving

## 2022-07-22 NOTE — Telephone Encounter (Signed)
He can start back now

## 2022-08-02 ENCOUNTER — Telehealth: Payer: Self-pay

## 2022-08-02 NOTE — Progress Notes (Signed)
Care Management & Coordination Services Pharmacy Team  Reason for Encounter: Appointment Reminder  Contacted patient to confirm telephone appointment with Al Corpus, PharmD on 08/05/2022 at 3:45.  Unsuccessful outreach. Left voicemail for patient to return call.  Star Rating Drugs:  Medication:                Last Fill:         Day Supply Rosuvastatin 40 mg    05/10/2022      90   Care Gaps: Annual wellness visit in last year? Yes 02/24/2022   Al Corpus, PharmD notified   Claudina Lick, RMA Clinical Pharmacy Assistant [phone]

## 2022-08-05 ENCOUNTER — Ambulatory Visit: Payer: Medicare Other | Admitting: Pharmacist

## 2022-08-05 NOTE — Progress Notes (Signed)
Care Management & Coordination Services Pharmacy Note  08/05/2022 Name:  [name] MRN:  [id number] DOB:  [birth year]  Summary: F/U visit -HFrEF: complicated by adrenal insufficiency; pt reports wt has been stable around 170-172 lbs lately, SOB and energy both improved; yesterday pt saw cardiology and spironolactone was discontinued d/t interaction with adrenal insufficiency, and recommended to restart [place]; Pt could not afford Marcelline Deist previously but will qualify for [employer] grant now -DM: A1c 6.8% (04/2021) in diabetic range but not on treatment; he is due for re-check  Recommendations/Changes made from today's visit: -Advised to continue weighing daily; contact provider if wt gain 3+ lbs in 1 day or 5+ lbs in 1 week -Recommend to check A1c at next OV  Follow up plan: -Pharmacist follow up televisit scheduled for 1 month -Endocrine appt 07/15/22; PCP appt 08/25/22; Cardiology appt 10/25/22    Subjective: [name] is an 72 y.o. year old male who is a primary patient of Karie Schwalbe, MD.  The care coordination team was consulted for assistance with disease management and care coordination needs.    Engaged with patient by telephone for follow up visit. Patient lives at home with his wife Darl Pikes. They have 7 acres of land a large garden, they harvest vegetables from and freeze them through the winter. They are from [place], [place] and travel there sometimes to visit children and grandchildren. They moved to [place] over 20 years ago, and have 1 son living locally.   Recent office visits: 06/21/22 Dr Alphonsus Sias OV: SOB - worsening CHF. Take extra furosemide now, then daily unless wt is under 163#  05/26/22 Dr Alphonsus Sias OV: COPD exacerbation - rx doxycycline, prednisone  04/28/22 Dr Alphonsus Sias OV: hospital f/u - fluid overload following fludrocortisone start.  02/24/22 Dr Alphonsus Sias OV: annual - off ARB d/t hypotension; off Farxiga d/t cost.  Recent consult visits: 07/05/22 Dr Mariah Milling  (Cardiology): f/u CAD, HF. D/C spironolactone, recommend Jardiance/Farxiga. Would decrease lasix down to 20 mg when starting SGLT2, repeat BMP in several weeks.  06/22/22 Dr Wyn Quaker (Vasc Surg): AAA - US shows growth - increased risk of rupture. Order CT and plan for repair in near future. ABI demonstrate PAD w/ claudication.   05/10/22 Dr Mariah Milling (Cardiology): f/u - recent hospital required IV lasix 2/2 dietary indiscretion over holidays; hold lasix for SBP < 100  04/09/22 Dr Fransico Him (Endocrine): adrenal insufficiency - rx hydrocortisone and fludrocortisone.   03/08/22 PA Cadence Fransico Michael (Cardiology): HF; previously unable to afford [place], not interested in PAP. Start spironolactone 12.5 mg daily  Hospital visits: 07/14/22 planned admission [facility]): AAA repair/stent 04/20/22 - 04/22/22 Admission [facility]): CHF exacerbation. Discharged on lasix PRN.   01/24/22 - 01/26/22 Admission [facility]): Hypotension, dizziness (SBP 50s). Suspicious for adrenal insufficiency. Sx improved with Decadron. Needs endocrine f/u.  Objective:  Lab Results  Component Value Date   CREATININE 0.87 07/15/2022   BUN 6 (L) 07/15/2022   GFR 87.27 12/15/2021   EGFR 96 06/04/2021   GFRNONAA >60 07/15/2022   GFRAA >60 01/18/2020   NA 133 (L) 07/15/2022   K 3.4 (L) 07/15/2022   CALCIUM 8.2 (L) 07/15/2022   CO2 26 07/15/2022   GLUCOSE 113 (H) 07/15/2022    Lab Results  Component Value Date/Time   HGBA1C 6.8 (H) 05/14/2021 12:36 AM   HGBA1C 5.8 (H) 04/04/2014 03:39 PM   GFR 87.27 12/15/2021 08:19 AM   GFR 89.38 02/20/2021 10:27 AM    Last diabetic Eye exam: No results found for: "HMDIABEYEEXA"  Last diabetic  Foot exam: No results found for: "HMDIABFOOTEX"   Lab Results  Component Value Date   CHOL 136 12/15/2021   HDL 52.20 12/15/2021   LDLCALC 50 12/15/2021   LDLDIRECT 65.0 02/20/2021   TRIG 172.0 (H) 12/15/2021   CHOLHDL 3 12/15/2021       Latest Ref Rng & Units 07/08/2022    1:36 PM 01/24/2022   10:10 PM 12/15/2021     8:19 AM  Hepatic Function  Total Protein 6.5 - 8.1 g/dL 7.4  6.4  6.5    6.7   Albumin 3.5 - 5.0 g/dL 3.9  3.5  4.0   AST 15 - 41 U/L 26  18  15    ALT 0 - 44 U/L 15  12  8    Alk Phosphatase 38 - 126 U/L 31  36  37   Total Bilirubin 0.3 - 1.2 mg/dL 1.0  0.7  0.6     Lab Results  Component Value Date/Time   TSH 1.342 01/25/2022 05:50 PM   TSH 2.73 12/15/2021 08:19 AM   TSH 2.086 10/28/2017 04:16 PM   TSH 4.010 04/04/2014 03:39 PM   FREET4 0.83 01/05/2016 12:02 PM   FREET4 0.90 12/31/2014 10:25 AM       Latest Ref Rng & Units 07/15/2022    4:34 AM 07/08/2022    1:36 PM 04/22/2022    8:03 AM  CBC  WBC 4.0 - 10.5 K/uL 7.7  5.6    Hemoglobin 13.0 - 17.0 g/dL 9.4  66.0  60.0    45.9   Hematocrit 39.0 - 52.0 % 29.5  36.1  34.0    34.0   Platelets 150 - 400 K/uL 239  349      Lab Results  Component Value Date/Time   VITAMINB12 750 01/25/2022 05:50 PM   VITAMINB12 219 12/15/2021 08:19 AM    Clinical ASCVD: Yes  The ASCVD Risk score (Arnett DK, et al., 2019) failed to calculate for the following reasons:   The patient has a prior MI or stroke diagnosis       02/24/2022   10:53 AM 08/19/2021    9:54 AM 06/09/2021    9:46 AM  Depression screen PHQ 2/9  Decreased Interest 0 0 0  Down, Depressed, Hopeless 0 0 0  PHQ - 2 Score 0 0 0  Altered sleeping  0 0  Tired, decreased energy  1 1  Change in appetite  0 0  Feeling bad or failure about yourself   0 0  Trouble concentrating  0 0  Moving slowly or fidgety/restless  0 0  Suicidal thoughts  0 0  PHQ-9 Score  1 1  Difficult doing work/chores  Not difficult at all Not difficult at all     Social History   Tobacco Use  Smoking Status Former   Packs/day: 3.00   Years: 25.00   Additional pack years: 0.00   Total pack years: 75.00   Types: Cigarettes   Quit date: 05/28/1993   Years since quitting: 29.2   Passive exposure: Past  Smokeless Tobacco Never   BP Readings from Last 3 Encounters:  07/15/22 131/65   07/05/22 94/70  06/22/22 131/85   Pulse Readings from Last 3 Encounters:  07/15/22 (!) 54  07/05/22 64  06/22/22 60   Wt Readings from Last 3 Encounters:  07/14/22 175 lb 7.8 oz (79.6 kg)  07/05/22 177 lb 4 oz (80.4 kg)  06/22/22 176 lb (79.8 kg)   BMI Readings from Last 3  Encounters:  07/14/22 30.12 kg/m  07/05/22 31.40 kg/m  06/22/22 31.18 kg/m    Allergies  Allergen Reactions   Metoprolol Tartrate Hives and Itching   Quinolones Other (See Comments)    Contraindicated with AAA   Metoclopramide Nausea Only    Medications Reviewed Today     Reviewed by Otho Ket, RN (Registered Nurse) on 07/22/22 at 1044  Med List Status: <None>   Medication Order Taking? Sig Documenting Provider Last Dose Status Informant  aspirin 81 MG tablet [id number] No Take 81 mg by mouth daily. [provider] 07/13/2022 0800 Active Self  carvedilol (COREG) 3.125 MG tablet [id number] No TAKE 1 TABLET BY MOUTH 2 TIMES DAILY. Fransico Michael, Cadence H, PA-C 07/14/2022 0530 Active   clopidogrel (PLAVIX) 75 MG tablet [id number] No Take 1 tablet (75 mg total) by mouth daily. Antonieta Iba, MD 07/13/2022 Active   cyanocobalamin (VITAMIN B12) 1000 MCG tablet [id number] No Take 1,000 mcg by mouth daily. [provider] 07/14/2022 Active Self  dapagliflozin propanediol (FARXIGA) 10 MG TABS tablet [id number] No Take 10 mg by mouth daily. Fransico Michael, Cadence H, PA-C 07/10/2022 Active            Med Note Kathyrn Sheriff   Tue Jul 06, 2022 12:13 PM) [employer] grant 06/06/22 - 06/06/23  ezetimibe (ZETIA) 10 MG tablet [id number] No Take 1 tablet (10 mg total) by mouth daily. Antonieta Iba, MD 07/14/2022 0530 Active   fludrocortisone (FLORINEF) 0.1 MG tablet [id number] No Take 1 tablet (0.1 mg total) by mouth daily. Roma Kayser, MD Taking Active Self  furosemide (LASIX) 40 MG tablet [id number] No Take 1 tablet (40 mg total) by mouth daily. Hold if weight under 163# Karie Schwalbe, MD  07/13/2022 Active   gabapentin (NEURONTIN) 100 MG capsule [id number] No Take 1 capsule (100 mg total) by mouth 2 (two) times daily. Karie Schwalbe, MD 07/14/2022 Active   HYDROcodone-acetaminophen (NORCO/VICODIN) 5-325 MG tablet [id number]  Take 1 tablet by mouth every 4 (four) hours as needed for moderate pain. Georgiana Spinner, NP  Active   hydrocortisone (CORTEF) 10 MG tablet [id number] No Take 1 tablet daily at 8 AM, 1 tablet daily at noon Roma Kayser, MD 07/14/2022 0530 Active Self  mirtazapine (REMERON) 15 MG tablet [id number] No TAKE 1 TABLET BY MOUTH AT BEDTIME Karie Schwalbe, MD 07/13/2022 Active   Multiple Vitamin (MULTIVITAMIN WITH MINERALS) TABS tablet [id number] No Take 1 tablet by mouth daily. [provider] 07/13/2022 Active Self  nitroGLYCERIN (NITROSTAT) 0.4 MG SL tablet [id number] No PLACE 1 TABLET UNDER THE TONGUE EVERY 5 MINUTES AS NEEDED FOR CHEST PAIN.  Patient taking differently: Place 0.4 mg under the tongue every 5 (five) minutes as needed for chest pain (call 911).   Karie Schwalbe, MD Taking Active Self  pantoprazole (PROTONIX) 40 MG tablet [id number] No TAKE 1 TABLET BY MOUTH TWICE DAILY Karie Schwalbe, MD 07/14/2022 0530 Active   ranolazine (RANEXA) 500 MG 12 hr tablet [id number] No Take 1 tablet (500 mg total) by mouth 2 (two) times daily. Antonieta Iba, MD 07/14/2022 0530 Active   rosuvastatin (CRESTOR) 40 MG tablet [id number] No Take 1 tablet (40 mg total) by mouth daily. Antonieta Iba, MD 07/14/2022 0530 Active             SDOH:  (Social Determinants of Health) assessments and interventions performed: No SDOH Interventions    Flowsheet Row Telephone from 07/16/2022 in [facility] Coordination  Telephone from 04/23/2022 in [facility] Care Coordination from 02/04/2022 in [facility]  SDOH Interventions     Food Insecurity  Interventions Intervention Not Indicated Intervention Not Indicated Intervention Not Indicated  Housing Interventions Intervention Not Indicated Intervention Not Indicated Intervention Not Indicated  Transportation Interventions Intervention Not Indicated Intervention Not Indicated Intervention Not Indicated       Medication Assistance:  Oleh GeninFarxiga - [employer] grant approved 06/06/22 - 06/06/23 BIN: [id number] PCN: [id number] GRP: [id number] ID: [id number]  Medication Access: Within the past 30 days, how often has patient missed a dose of medication? 0 Is a pillbox or other method used to improve adherence? Yes  Factors that may affect medication adherence? financial need Are meds synced by current pharmacy? No  Are meds delivered by current pharmacy? No  Does patient experience delays in picking up medications due to transportation concerns? No   [facility] Services Reviewed: Is patient disadvantaged to use [facility]?: No  Current Rx insurance plan: [employer] PDP Name and location of Current pharmacy:  [id number] [place], [place] - [address] [facility] [address] [place] [id number] Phone: [phone] Fax: [phone]  [facility] Pharm[facility] services reviewed with patient today?: No  Patient requests to transfer care to [facility] Pharmacy?: No  Reason patient declined to change pharmacies: Not mentioned at this visit  Compliance/Adherence/Medication fill history: Care Gaps: Colonoscopy (due 11/2021)  Star-Rating Drugs: Rosuvastatin - PDC 99%   ASSESSMENT / PLAN  Heart Failure / HTN (Goal: BP < 130/80) -Controlled  - pt reports checking BP and weighing twice daily at home; pt reports breathing is improved and he has more energy lately -Follows with cardiology (Dr Mariah MillingGollan) -Home BP/HR readings: unable to check (travelling d/t brother's funeral) -Home daily weights: 172# today; range 170-172 over the past week -Last ejection  fraction: 30-35% (Date: 04/21/22) -HF type: HFrEF (EF < 40%) -NYHA Class: II (slight limitation of activity) -AHA HF Stage: C (Heart disease and symptoms present) -Current treatment: Carvedilol 3.125 mg BID - Appropriate, Effective, Safe, Accessible Furosemide 40 mg daily - Appropriate, Effective, Safe, Accessible Farxiga 10 mg daily - Appropriate, Effective, Safe, Accessible -Medications previously tried: losartan (low BP), spironolactone (PAI) -Current dietary habits: chicken, fish, vegetables from garden -Current exercise habits: limited currently -Educated on Benefits of medications for managing symptoms and prolonging life Importance of weighing daily - for weight gain more than 2+ pounds in one day or 5 pounds in one week, contact provider -Enrolled patient in Cardiomyopathy [employer] grant for [place]Farxiga; contacted pharmacy to refill previous Rx for Farxiga 10 mg daily  Hyperlipidemia / CAD: (LDL goal < 55) -Controlled - LDL 50 (11/2021) at goal -Hx CAD - CABG 1995; stent 2004; stent 2012; NSTEMI 04/2021 and 03/2022 -Current treatment: Ezetimibe 10 mg daily - Appropriate, Effective, Safe, Accessible Rosuvastatin 40 mg daily -Appropriate, Effective, Safe, Accessible Clopidogrel 75 mg daily -Appropriate, Effective, Safe, Accessible Aspirin 81 mg daily -Appropriate, Effective, Safe, Accessible Ranexa 500 mg BID -Appropriate, Effective, Safe, Accessible Nitroglycerin 0.4 mg SL prn - not used -Medications previously tried: n/a  -Educated on Cholesterol goals; Benefits of statin for ASCVD risk reduction; -Recommended to continue current medication  COPD (Goal: control symptoms and prevent exacerbations) -Controlled -Gold Grade: Gold 1 (FEV1>80%) -Current COPD Classification:  A (low sx, 0-1 moderate exacerbations, no hospitalizations) -MMRC/CAT score: not on file -Pulmonary function testing: 10/2017 - FEV1 84% predicted; FEV1/FVC 0.7 -Exacerbations  requiring treatment in last 6 months:  1 -Current treatment  None -Medications previously tried: n/a   Adrenal insufficiency -Query controlled - pt reports still feels weak/fatigued, ever since discharge from hospital after HF exacerbation -Follows with endocrine (Dr Fransico Him). Dx 01/2022 -Current treatment  Fludrocortisone 0.1 mg daily - Appropriate, Effective, Query Safe Hydrocortisone 10 mg 8am and noon - Appropriate, Query Effective -Medications previously tried: dexamethasone  -See HF discussion above - possible interaction with spironolactone and fludrocortisone/adrenal insufficiency in general, consulting with cardiology and endocrine regarding best plan -Recommended to continue current medication  Query Diabetes (A1c goal <7%) -A1c 6.8% (04/2021). No diagnosis. -Pt would be more prone to hypoglycemia given adrenal insufficiency -Recommend to start Farxiga (see Heart Failure discussion)  Query sleep apnea -Patient saw pulmonary 10/2017 and recommended sleep study, which was never done  Health Maintenance -Vaccine gaps: Shingrix, RSV -GERD: On pantoprazole BID -on mirtazapine 15 mg HS -on gabapentin 100 mg BID -OTC: Vit B12   Al Corpus, PharmD, BCACP Clinical Pharmacist [facility] [phone]

## 2022-08-06 ENCOUNTER — Encounter: Payer: Self-pay | Admitting: "Endocrinology

## 2022-08-09 ENCOUNTER — Other Ambulatory Visit: Payer: Self-pay | Admitting: *Deleted

## 2022-08-09 DIAGNOSIS — E2740 Unspecified adrenocortical insufficiency: Secondary | ICD-10-CM

## 2022-08-09 NOTE — Telephone Encounter (Signed)
Patient was called and a message was left , letting him know that his labs had been updated. He will just need to go to a [facility] corp to get drawn.

## 2022-08-17 ENCOUNTER — Ambulatory Visit (INDEPENDENT_AMBULATORY_CARE_PROVIDER_SITE_OTHER): Payer: Medicare Other | Admitting: Nurse Practitioner

## 2022-08-17 ENCOUNTER — Other Ambulatory Visit (INDEPENDENT_AMBULATORY_CARE_PROVIDER_SITE_OTHER): Payer: Medicare Other

## 2022-08-25 ENCOUNTER — Ambulatory Visit: Payer: Medicare Other | Admitting: Internal Medicine

## 2022-08-25 DEATH — deceased

## 2022-10-11 ENCOUNTER — Ambulatory Visit: Payer: Medicare Other | Admitting: "Endocrinology

## 2022-10-25 ENCOUNTER — Ambulatory Visit: Payer: Medicare Other | Admitting: Cardiovascular Disease

## 2023-02-28 IMAGING — DX DG KNEE COMPLETE 4+V*R*
3 series · 3 of 3 positions shown · non-contrast
Comparison: None.

CLINICAL DATA: Chronic right knee pain.

EXAM:
RIGHT KNEE - COMPLETE 4+ VIEW

[knee ap]
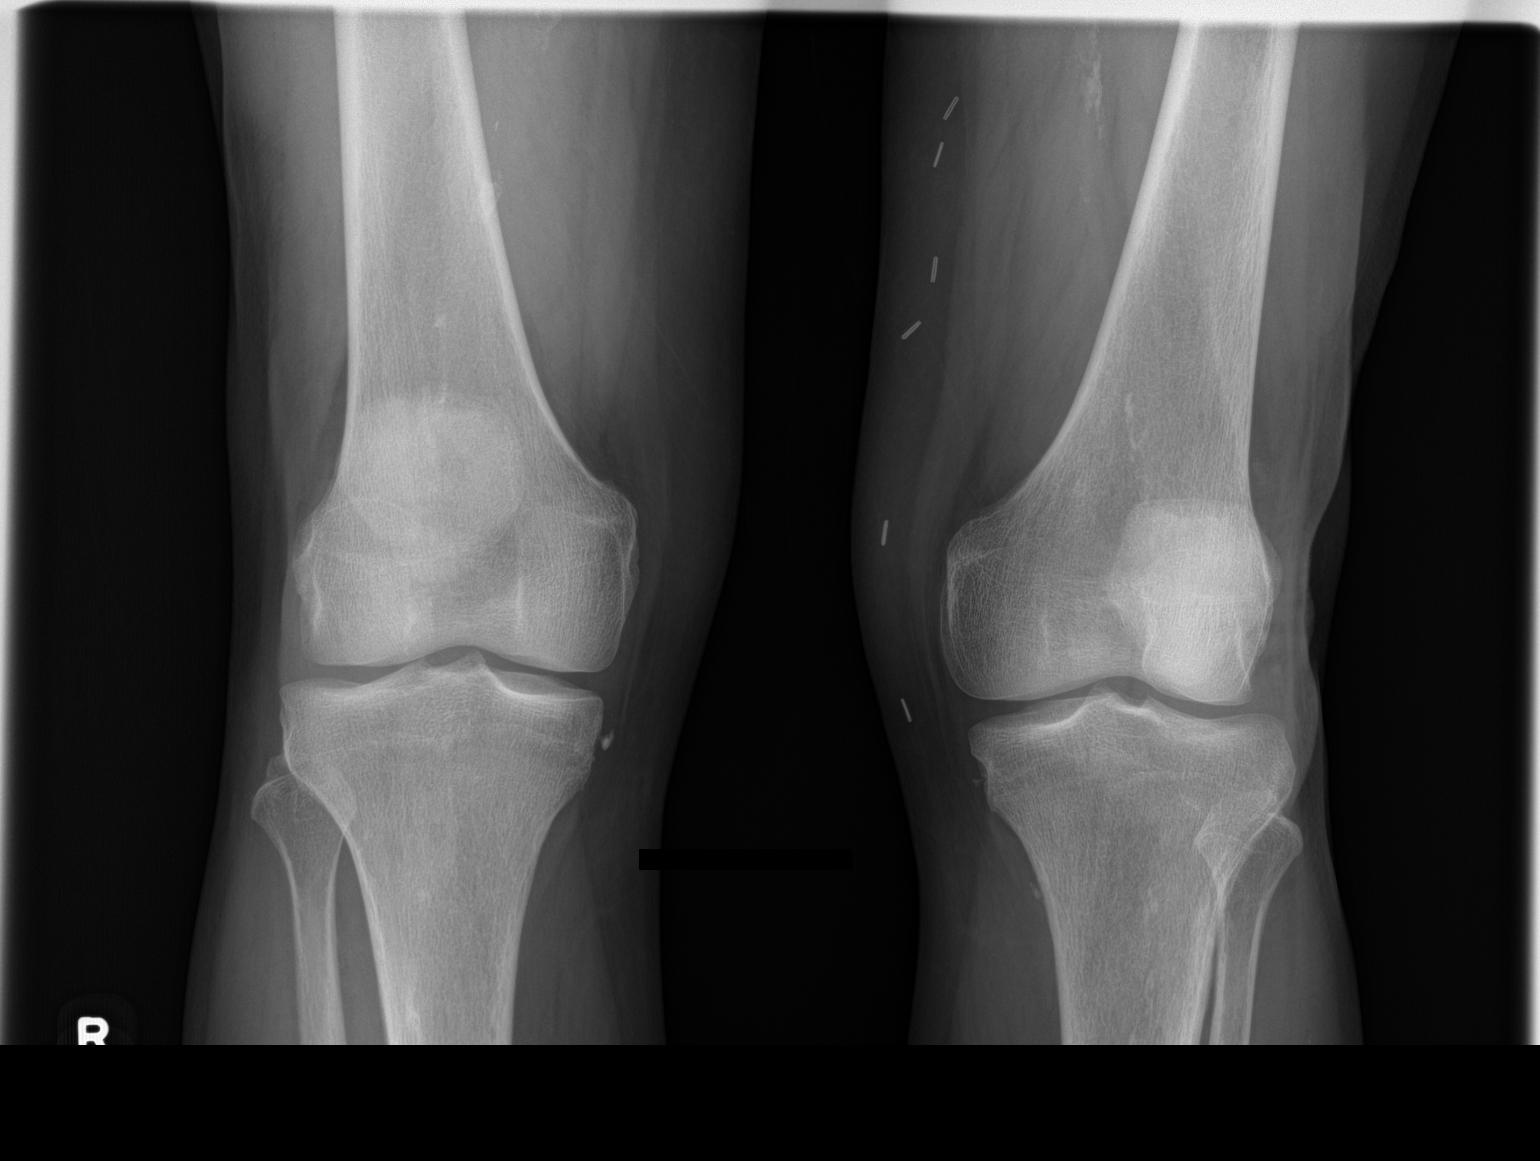

[knee lat]
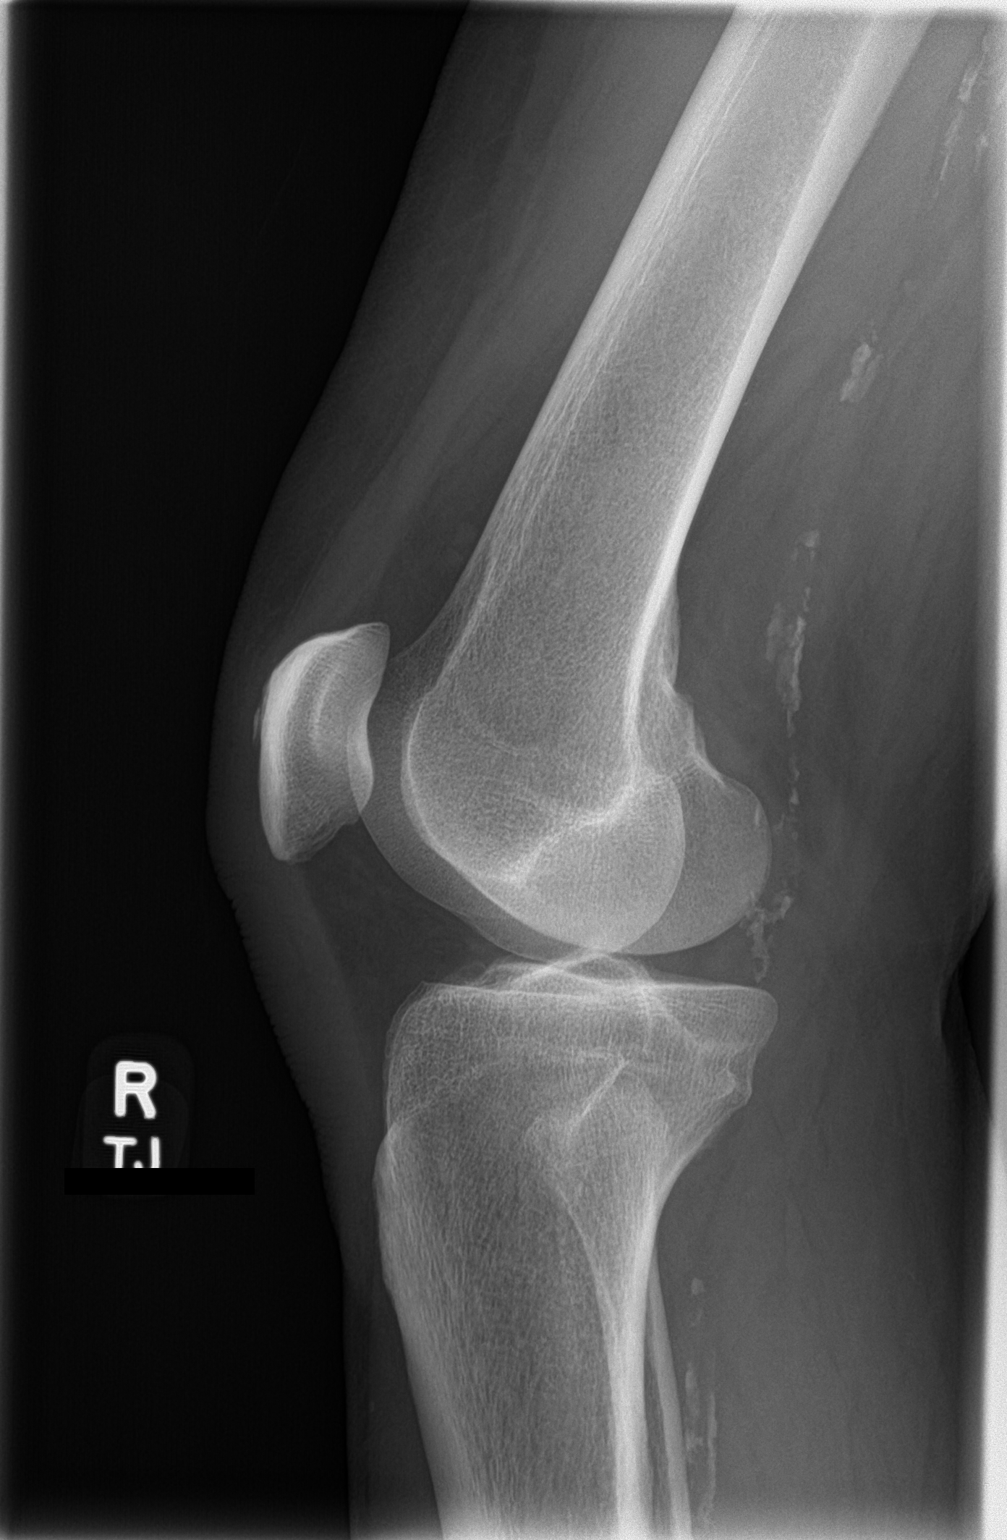

[patella skyline]
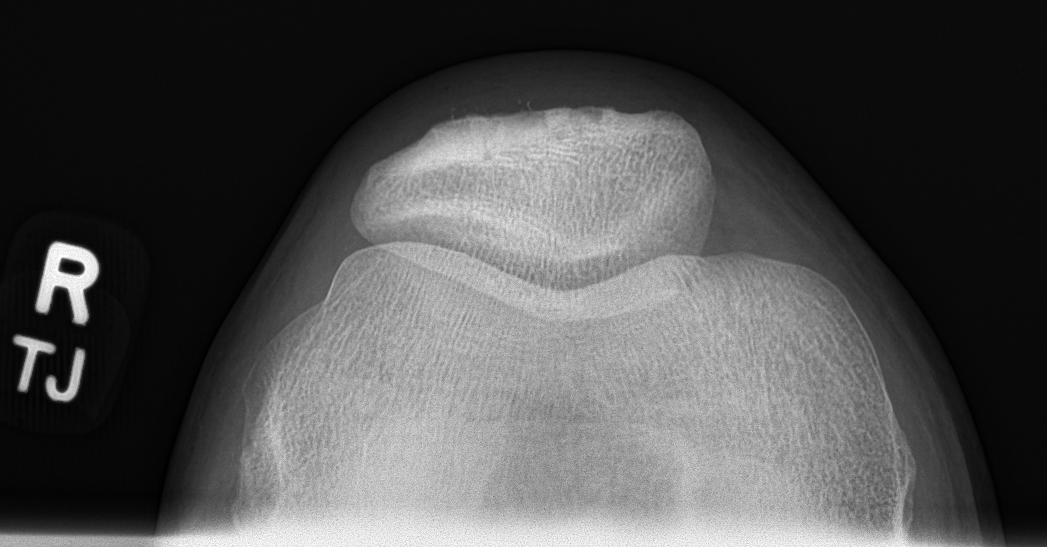

[3 of 3 positions shown; findings below may reference images not displayed]

FINDINGS: AP weight-bearing views of both knees were obtained. Total of three
views of the right knee were obtained. Right knee is located without
a fracture or large joint effusion. No significant joint space
narrowing in the right knee. No significant joint space narrowing in
left knee. Surgical clips in the left lower thigh region. Diffuse
atherosclerotic calcifications.
IMPRESSION: 1. No acute abnormality to the right knee.
2. No significant degenerative disease in the right knee.

## 2023-10-24 IMAGING — DX DG CHEST 1V PORT
1 series · 1 of 1 positions shown · non-contrast
Comparison: Chest x-ray 01/18/2020, CT chest 10/28/2017.

CLINICAL DATA: Chestpaonetc.  Chest pain

EXAM:
PORTABLE CHEST 1 VIEW

[chest]
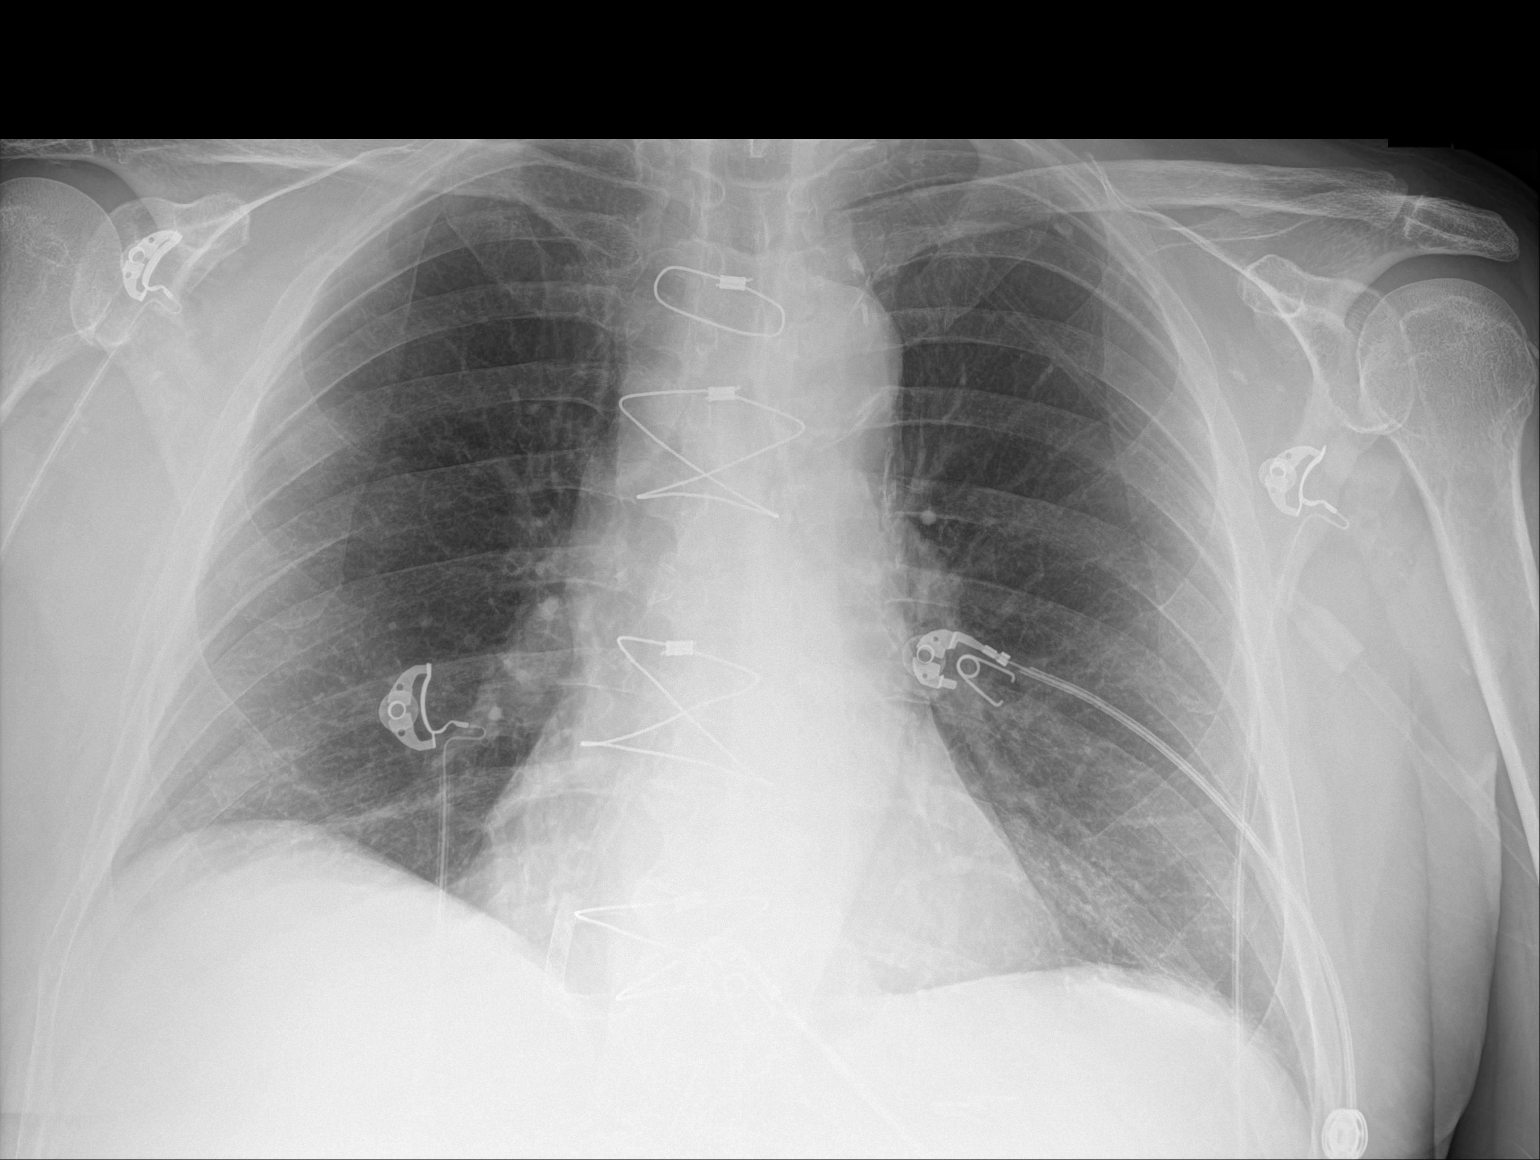

[1 of 1 positions shown; findings below may reference images not displayed]

FINDINGS: The heart and mediastinal contours are unchanged. Aortic
calcification. Coronary artery stent noted.

Right base linear atelectasis versus scarring. No focal
consolidation. No pulmonary edema. No pleural effusion. No
pneumothorax.

No acute osseous abnormality.
IMPRESSION: No active disease.
